# Patient Record
Sex: Female | Born: 1950
Health system: Southern US, Community
[De-identification: ages and names within clinical notes are randomized; demographics above are authoritative.]

## PROBLEM LIST (undated history)

## (undated) DIAGNOSIS — I10 Essential (primary) hypertension: Secondary | ICD-10-CM

## (undated) DIAGNOSIS — K635 Polyp of colon: Secondary | ICD-10-CM

## (undated) DIAGNOSIS — H409 Unspecified glaucoma: Secondary | ICD-10-CM

## (undated) DIAGNOSIS — E785 Hyperlipidemia, unspecified: Secondary | ICD-10-CM

## (undated) DIAGNOSIS — M1990 Unspecified osteoarthritis, unspecified site: Secondary | ICD-10-CM

## (undated) DIAGNOSIS — K921 Melena: Secondary | ICD-10-CM

## (undated) HISTORY — DX: Melena: K92.1

## (undated) HISTORY — DX: Hyperlipidemia, unspecified: E78.5

## (undated) HISTORY — DX: Polyp of colon: K63.5

## (undated) HISTORY — PX: WISDOM TOOTH EXTRACTION: SHX21

## (undated) HISTORY — DX: Unspecified osteoarthritis, unspecified site: M19.90

## (undated) HISTORY — DX: Essential (primary) hypertension: I10

---
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
## 1999-02-08 HISTORY — PX: HEMORRHOID SURGERY: SHX153

## 2000-11-27 ENCOUNTER — Other Ambulatory Visit: Admission: RE | Admit: 2000-11-27 | Discharge: 2000-11-27 | Payer: Self-pay | Admitting: Family Medicine

## 2003-04-04 ENCOUNTER — Other Ambulatory Visit: Admission: RE | Admit: 2003-04-04 | Discharge: 2003-04-04 | Payer: Self-pay | Admitting: Family Medicine

## 2004-02-08 DIAGNOSIS — J189 Pneumonia, unspecified organism: Secondary | ICD-10-CM

## 2004-02-08 HISTORY — DX: Pneumonia, unspecified organism: J18.9

## 2004-04-21 ENCOUNTER — Other Ambulatory Visit: Admission: RE | Admit: 2004-04-21 | Discharge: 2004-04-21 | Payer: Self-pay | Admitting: Family Medicine

## 2005-07-12 ENCOUNTER — Other Ambulatory Visit: Admission: RE | Admit: 2005-07-12 | Discharge: 2005-07-12 | Payer: Self-pay | Admitting: Family Medicine

## 2005-07-12 LAB — HM PAP SMEAR: HM Pap smear: NEGATIVE

## 2007-11-10 LAB — CBC AND DIFFERENTIAL
HCT: 41 (ref 36–46)
Hemoglobin: 14.2 (ref 12.0–16.0)
Neutrophils Absolute: 3
WBC: 5.4

## 2007-11-10 LAB — LIPID PANEL
Cholesterol: 219 — AB (ref 0–200)
HDL: 56 (ref 35–70)
LDL Cholesterol: 143
Triglycerides: 102 (ref 40–160)

## 2007-11-10 LAB — BASIC METABOLIC PANEL
BUN: 10 (ref 4–21)
Creatinine: 0.9 (ref 0.5–1.1)
Potassium: 4.4 (ref 3.4–5.3)
Sodium: 140 (ref 137–147)

## 2007-11-10 LAB — HEPATIC FUNCTION PANEL
ALT: 19 (ref 7–35)
AST: 17 (ref 13–35)
Alkaline Phosphatase: 81 (ref 25–125)

## 2017-04-04 ENCOUNTER — Ambulatory Visit: Payer: Self-pay | Admitting: Family Medicine

## 2017-04-06 ENCOUNTER — Encounter: Payer: Self-pay | Admitting: *Deleted

## 2017-04-10 ENCOUNTER — Encounter: Payer: Self-pay | Admitting: Family Medicine

## 2017-04-10 ENCOUNTER — Ambulatory Visit: Payer: BLUE CROSS/BLUE SHIELD | Admitting: Family Medicine

## 2017-04-10 VITALS — BP 141/88 | HR 82 | Temp 98.6°F | Ht 63.0 in | Wt 153.0 lb

## 2017-04-10 DIAGNOSIS — Z7689 Persons encountering health services in other specified circumstances: Secondary | ICD-10-CM | POA: Diagnosis not present

## 2017-04-10 DIAGNOSIS — Z0000 Encounter for general adult medical examination without abnormal findings: Secondary | ICD-10-CM

## 2017-04-10 DIAGNOSIS — Z1159 Encounter for screening for other viral diseases: Secondary | ICD-10-CM

## 2017-04-10 DIAGNOSIS — R030 Elevated blood-pressure reading, without diagnosis of hypertension: Secondary | ICD-10-CM | POA: Diagnosis not present

## 2017-04-10 DIAGNOSIS — Z114 Encounter for screening for human immunodeficiency virus [HIV]: Secondary | ICD-10-CM

## 2017-04-10 DIAGNOSIS — Z1231 Encounter for screening mammogram for malignant neoplasm of breast: Secondary | ICD-10-CM

## 2017-04-10 DIAGNOSIS — E663 Overweight: Secondary | ICD-10-CM

## 2017-04-10 DIAGNOSIS — Z130 Encounter for screening for diseases of the blood and blood-forming organs and certain disorders involving the immune mechanism: Secondary | ICD-10-CM

## 2017-04-10 DIAGNOSIS — E2839 Other primary ovarian failure: Secondary | ICD-10-CM | POA: Diagnosis not present

## 2017-04-10 DIAGNOSIS — Z13220 Encounter for screening for lipoid disorders: Secondary | ICD-10-CM

## 2017-04-10 DIAGNOSIS — Z1239 Encounter for other screening for malignant neoplasm of breast: Secondary | ICD-10-CM

## 2017-04-10 DIAGNOSIS — Z23 Encounter for immunization: Secondary | ICD-10-CM

## 2017-04-10 DIAGNOSIS — Z131 Encounter for screening for diabetes mellitus: Secondary | ICD-10-CM | POA: Diagnosis not present

## 2017-04-10 NOTE — Progress Notes (Signed)
Patient ID: Angela [name], female  DOB: May 08, 1950, 67 y.o.   MRN: [id number] Patient Care Team    Relationship Specialty Notifications Start End  Ma Hillock, DO PCP - General Family Medicine  04/10/17     Chief Complaint  Patient presents with  . Establish Care    CPE    Subjective:  Angela [name] is a 67 y.o.  Female  present for CPE. All past medical history, surgical history, allergies, family history, immunizations, medications and social history were obtained and entered in the electronic medical record today. All recent labs, ED visits and hospitalizations within the last year were reviewed.  Health maintenance:  Colonoscopy: completed 2-3 years ago by [facility], colon polyps present. Records requested.  Mammogram: many years ago. No fhx. --> ordered today Cervical cancer screening: many years ago. No abnormal PAP. Pt will schedule PAP in 4 months.  Immunizations: tdap completed today, Influenza UTD 2018 (encouraged yearly), PNA series start at 22. Shingrix #1 today, rpt dose in 2-6 months.  Infectious disease screening: HIV and  Hep C ordered.  DEXA: ordered today Assistive device: none Oxygen use: none Patient has a Dental home. Hospitalizations/ED visits: none   Depression screen [facility] 2/9 04/10/2017  Decreased Interest 0  Down, Depressed, Hopeless 0  PHQ - 2 Score 0   No flowsheet data found.   Current Exercise Habits: Home exercise routine, Type of exercise: Other - see comments, Time (Minutes): 60, Frequency (Times/Week): 5, Weekly Exercise (Minutes/Week): 300, Intensity: Moderate     Immunization History  Administered Date(s) Administered  . Influenza, High Dose Seasonal PF 10/14/2016  . Influenza-Unspecified 01/08/2001, 11/13/2001, 11/20/2012  . Td 02/07/1998  . Tdap 04/10/2017  . Zoster Recombinat (Shingrix) 04/10/2017     Past Medical History:  Diagnosis Date  . Arthritis   . Blood in stool   . Colon polyps   . Hyperlipidemia     Allergies  Allergen Reactions  . Pollen Extract    Past Surgical History:  Procedure Laterality Date  . EYE SURGERY  1956  . [place]  2001  . TONSILLECTOMY  1963  . WISDOM TOOTH EXTRACTION     Family History  Problem Relation Age of Onset  . Heart attack Father   . Heart attack Sister   . Hyperlipidemia Sister   . Hypertension Sister    Social History   Socioeconomic History  . Marital status: Married    Spouse name: Not on file  . Number of children: Not on file  . Years of education: Not on file  . Highest education level: Not on file  Social Needs  . Financial resource strain: Not on file  . Food insecurity - worry: Not on file  . Food insecurity - inability: Not on file  . Transportation needs - medical: Not on file  . Transportation needs - non-medical: Not on file  Occupational History  . Not on file  Tobacco Use  . Smoking status: Heavy Tobacco Smoker    Packs/day: 1.00    Years: 15.00    Pack years: 15.00    Types: Cigars  . Smokeless tobacco: Never Used  Substance and Sexual Activity  . Alcohol use: Yes    Alcohol/week: 2.4 oz    Types: 4 Glasses of wine per week  . Drug use: No  . Sexual activity: Yes    Partners: Male    Comment: Married  Other Topics Concern  . Not on file  Social History Narrative   Married. Retired. 2 children.   Bachelors degree.   Exercises routinely.   Drinks caffeine.   Smoke alarm in the home. Wears her seatbelt. Owns firearms.   Feels safe in her relationships.   Allergies as of 04/10/2017      Reactions   Pollen Extract       Medication List        Accurate as of 04/10/17  5:09 PM. Always use your most recent med list.          cholecalciferol 1000 units tablet Commonly known as:  VITAMIN D Take 1,000 Units by mouth daily.   FIBER-CAPS PO Take by mouth.   multivitamin tablet Take 1 tablet by mouth daily.       All past medical history, surgical history, allergies, family history,  immunizations andmedications were updated in the EMR today and reviewed under the history and medication portions of their EMR.     No results found for this or any previous visit (from the past 2160 hour(s)).  No results found.   ROS: 14 pt review of systems performed and negative (unless mentioned in an HPI)  Objective: BP (!) 141/88 (BP Location: Right Arm, Patient Position: Sitting, Cuff Size: Normal)   Pulse 82   Temp 98.6 F (37 C) (Oral)   Ht '5\' 3"'  (1.6 m)   Wt 153 lb (69.4 kg)   SpO2 95%   BMI 27.10 kg/m  Gen: Afebrile. No acute distress. Nontoxic in appearance, well-developed, well-nourished,  pleasant caucasian female. overweight HENT: AT. [place]. Bilateral TM visualized and normal in appearance, normal external auditory canal. MMM, no oral lesions, adequate dentition. Bilateral nares within normal limits. Throat without erythema, ulcerations or exudates. no Cough on exam, no hoarseness on exam. Eyes:Pupils Equal Round Reactive to light, Extraocular movements intact,  Conjunctiva without redness, discharge or icterus. Neck/lymp/endocrine: Supple,no lymphadenopathy, no thyromegaly CV: RRR no murmur, no edema, +2/4 P posterior tibialis pulses. no carotid bruits. No JVD. Chest: CTAB, no wheeze, rhonchi or crackles. normal Respiratory effort. good Air movement. Abd: Soft. overwieght. NTND. BS present. no Masses palpated. No hepatosplenomegaly. No rebound tenderness or guarding. Skin: no rashes, purpura or petechiae. Warm and well-perfused. Skin intact. Neuro/Msk:  Normal gait. PERLA. EOMi. Alert. Oriented x3.  Cranial nerves II through XII intact. Muscle strength 5/5 upper/lower extremity. DTRs equal bilaterally. Psych: Normal affect, dress and demeanor. Normal speech. Normal thought content and judgment.   No exam data present  Assessment/plan: Angela [name] is a 67 y.o. female present for CPE.  Encounter for preventive health examination/EST care Patient was encouraged to  exercise greater than 150 minutes a week. Patient was encouraged to choose a diet filled with fresh fruits and vegetables, and lean meats. AVS provided to patient today for education/recommendation on gender specific health and safety maintenance. Colonoscopy: completed 2-3 years ago by [facility], colon polyps present. Records requested.  Mammogram: many years ago. No fhx. --> ordered today Cervical cancer screening: many years ago. No abnormal PAP. Pt will schedule PAP in 4 months.  Immunizations: tdap completed today, Influenza UTD 2018 (encouraged yearly), PNA series start at 12. Shingrix #1 today, rpt dose in 2-6 months.  Infectious disease screening: HIV and  Hep C ordered.  DEXA: ordered today Encounter for hepatitis C screening test for low risk patient - Hepatitis C Antibody; Future Encounter for screening for HIV - HIV antibody (with reflex); Future Screening for deficiency anemia - CBC w/Diff; Future Diabetes mellitus screening -  HgB A1c; Future Screening cholesterol level - Lipid panel; Future Estrogen deficiency - DG Bone Density; Future Breast cancer screening - MM DIGITAL SCREENING BILATERAL; Future Elevated BP without diagnosis of hypertension/overweight Low sodium, exercise > 150 minutes a week. If elevated on repeat at PAP consider start of med.  - Comp Met (CMET); Future - TSH; Future - HgB A1c; Future - Lipid panel; Future Immunization due - Tdap vaccine greater than or equal to 7yo IM - Varicella-zoster vaccine IM--> #2 in 4 mos at PAP   Return in about 4 months (around 08/10/2017) for PAP, shingrix #2, BP recheck.  Electronically signed by: Howard Pouch, DO [place]

## 2017-04-10 NOTE — Patient Instructions (Signed)
I have ordered the mammogram and DEXA scan to be completed at [facility].  Schedule fasting labs this week.  You will receive tdap and shingrix #1 (repeat in 2-6 months) Schedule PAP in 4-5 months and we can give the second shingrix at that time.   Health Maintenance, Female Adopting a healthy lifestyle and getting preventive care can go a long way to promote health and wellness. Talk with your health care provider about what schedule of regular examinations is right for you. This is a good chance for you to check in with your provider about disease prevention and staying healthy. In between checkups, there are plenty of things you can do on your own. Experts have done a lot of research about which lifestyle changes and preventive measures are most likely to keep you healthy. Ask your health care provider for more information. Weight and diet Eat a healthy diet  Be sure to include plenty of vegetables, fruits, low-fat dairy products, and lean protein.  Do not eat a lot of foods high in solid fats, added sugars, or salt.  Get regular exercise. This is one of the most important things you can do for your health. ? Most adults should exercise for at least 150 minutes each week. The exercise should increase your heart rate and make you sweat (moderate-intensity exercise). ? Most adults should also do strengthening exercises at least twice a week. This is in addition to the moderate-intensity exercise.  Maintain a healthy weight  Body mass index (BMI) is a measurement that can be used to identify possible weight problems. It estimates body fat based on height and weight. Your health care provider can help determine your BMI and help you achieve or maintain a healthy weight.  For females 56 years of age and older: ? A BMI below 18.5 is considered underweight. ? A BMI of 18.5 to 24.9 is normal. ? A BMI of 25 to 29.9 is considered overweight. ? A BMI of 30 and above is considered  obese.  Watch levels of cholesterol and blood lipids  You should start having your blood tested for lipids and cholesterol at 65 years of age, then have this test every 5 years.  You may need to have your cholesterol levels checked more often if: ? Your lipid or cholesterol levels are high. ? You are older than 65 years of age. ? You are at high risk for heart disease.  Cancer screening Lung Cancer  Lung cancer screening is recommended for adults 50-28 years old who are at high risk for lung cancer because of a history of smoking.  A yearly low-dose CT scan of the lungs is recommended for people who: ? Currently smoke. ? Have quit within the past 15 years. ? Have at least a 30-pack-year history of smoking. A pack year is smoking an average of one pack of cigarettes a day for 1 year.  Yearly screening should continue until it has been 15 years since you quit.  Yearly screening should stop if you develop a health problem that would prevent you from having lung cancer treatment.  Breast Cancer  Practice breast self-awareness. This means understanding how your breasts normally appear and feel.  It also means doing regular breast self-exams. Let your health care provider know about any changes, no matter how small.  If you are in your 20s or 30s, you should have a clinical breast exam (CBE) by a health care provider every 1-3 years as part of a regular  health exam.  If you are 40 or older, have a CBE every year. Also consider having a breast X-ray (mammogram) every year.  If you have a family history of breast cancer, talk to your health care provider about genetic screening.  If you are at high risk for breast cancer, talk to your health care provider about having an MRI and a mammogram every year.  Breast cancer gene (BRCA) assessment is recommended for women who have family members with BRCA-related cancers. BRCA-related cancers  include: ? Breast. ? Ovarian. ? Tubal. ? Peritoneal cancers.  Results of the assessment will determine the need for genetic counseling and BRCA1 and BRCA2 testing.  Cervical Cancer Your health care provider may recommend that you be screened regularly for cancer of the pelvic organs (ovaries, uterus, and vagina). This screening involves a pelvic examination, including checking for microscopic changes to the surface of your cervix (Pap test). You may be encouraged to have this screening done every 3 years, beginning at age 21.  For women ages 30-65, health care providers may recommend pelvic exams and Pap testing every 3 years, or they may recommend the Pap and pelvic exam, combined with testing for human papilloma virus (HPV), every 5 years. Some types of HPV increase your risk of cervical cancer. Testing for HPV may also be done on women of any age with unclear Pap test results.  Other health care providers may not recommend any screening for nonpregnant women who are considered low risk for pelvic cancer and who do not have symptoms. Ask your health care provider if a screening pelvic exam is right for you.  If you have had past treatment for cervical cancer or a condition that could lead to cancer, you need Pap tests and screening for cancer for at least 20 years after your treatment. If Pap tests have been discontinued, your risk factors (such as having a new sexual partner) need to be reassessed to determine if screening should resume. Some women have medical problems that increase the chance of getting cervical cancer. In these cases, your health care provider may recommend more frequent screening and Pap tests.  Colorectal Cancer  This type of cancer can be detected and often prevented.  Routine colorectal cancer screening usually begins at 65 years of age and continues through 65 years of age.  Your health care provider may recommend screening at an earlier age if you have risk factors  for colon cancer.  Your health care provider may also recommend using home test kits to check for hidden blood in the stool.  A small camera at the end of a tube can be used to examine your colon directly (sigmoidoscopy or colonoscopy). This is done to check for the earliest forms of colorectal cancer.  Routine screening usually begins at age 50.  Direct examination of the colon should be repeated every 5-10 years through 65 years of age. However, you may need to be screened more often if early forms of precancerous polyps or small growths are found.  Skin Cancer  Check your skin from head to toe regularly.  Tell your health care provider about any new moles or changes in moles, especially if there is a change in a mole's shape or color.  Also tell your health care provider if you have a mole that is larger than the size of a pencil eraser.  Always use sunscreen. Apply sunscreen liberally and repeatedly throughout the day.  Protect yourself by wearing long sleeves, pants, a   wide-brimmed hat, and sunglasses whenever you are outside.  Heart disease, diabetes, and high blood pressure  High blood pressure causes heart disease and increases the risk of stroke. High blood pressure is more likely to develop in: ? People who have blood pressure in the high end of the normal range (130-139/85-89 mm Hg). ? People who are overweight or obese. ? People who are African American.  If you are 54-30 years of age, have your blood pressure checked every 3-5 years. If you are 3 years of age or older, have your blood pressure checked every year. You should have your blood pressure measured twice-once when you are at a hospital or clinic, and once when you are not at a hospital or clinic. Record the average of the two measurements. To check your blood pressure when you are not at a hospital or clinic, you can use: ? An automated blood pressure machine at a pharmacy. ? A home blood pressure monitor.  If  you are between 55 years and 21 years old, ask your health care provider if you should take aspirin to prevent strokes.  Have regular diabetes screenings. This involves taking a blood sample to check your fasting blood sugar level. ? If you are at a normal weight and have a low risk for diabetes, have this test once every three years after 65 years of age. ? If you are overweight and have a high risk for diabetes, consider being tested at a younger age or more often. Preventing infection Hepatitis B  If you have a higher risk for hepatitis B, you should be screened for this virus. You are considered at high risk for hepatitis B if: ? You were born in a country where hepatitis B is common. Ask your health care provider which countries are considered high risk. ? Your parents were born in a high-risk country, and you have not been immunized against hepatitis B (hepatitis B vaccine). ? You have HIV or AIDS. ? You use needles to inject street drugs. ? You live with someone who has hepatitis B. ? You have had sex with someone who has hepatitis B. ? You get hemodialysis treatment. ? You take certain medicines for conditions, including cancer, organ transplantation, and autoimmune conditions.  Hepatitis C  Blood testing is recommended for: ? Everyone born from [birth year] through 1963. ? Anyone with known risk factors for hepatitis C.  Sexually transmitted infections (STIs)  You should be screened for sexually transmitted infections (STIs) including gonorrhea and chlamydia if: ? You are sexually active and are younger than 65 years of age. ? You are older than 65 years of age and your health care provider tells you that you are at risk for this type of infection. ? Your sexual activity has changed since you were last screened and you are at an increased risk for chlamydia or gonorrhea. Ask your health care provider if you are at risk.  If you do not have HIV, but are at risk, it may be recommended  that you take a prescription medicine daily to prevent HIV infection. This is called pre-exposure prophylaxis (PrEP). You are considered at risk if: ? You are sexually active and do not regularly use condoms or know the HIV status of your partner(s). ? You take drugs by injection. ? You are sexually active with a partner who has HIV.  Talk with your health care provider about whether you are at high risk of being infected with HIV. If you choose to  begin PrEP, you should first be tested for HIV. You should then be tested every 3 months for as long as you are taking PrEP. Pregnancy  If you are premenopausal and you may become pregnant, ask your health care provider about preconception counseling.  If you may become pregnant, take 400 to 800 micrograms (mcg) of folic acid every day.  If you want to prevent pregnancy, talk to your health care provider about birth control (contraception). Osteoporosis and menopause  Osteoporosis is a disease in which the bones lose minerals and strength with aging. This can result in serious bone fractures. Your risk for osteoporosis can be identified using a bone density scan.  If you are 65 years of age or older, or if you are at risk for osteoporosis and fractures, ask your health care provider if you should be screened.  Ask your health care provider whether you should take a calcium or vitamin D supplement to lower your risk for osteoporosis.  Menopause may have certain physical symptoms and risks.  Hormone replacement therapy may reduce some of these symptoms and risks. Talk to your health care provider about whether hormone replacement therapy is right for you. Follow these instructions at home:  Schedule regular health, dental, and eye exams.  Stay current with your immunizations.  Do not use any tobacco products including cigarettes, chewing tobacco, or electronic cigarettes.  If you are pregnant, do not drink alcohol.  If you are  breastfeeding, limit how much and how often you drink alcohol.  Limit alcohol intake to no more than 1 drink per day for nonpregnant women. One drink equals 12 ounces of beer, 5 ounces of wine, or 1 ounces of hard liquor.  Do not use street drugs.  Do not share needles.  Ask your health care provider for help if you need support or information about quitting drugs.  Tell your health care provider if you often feel depressed.  Tell your health care provider if you have ever been abused or do not feel safe at home. This information is not intended to replace advice given to you by your health care provider. Make sure you discuss any questions you have with your health care provider. Document Released: 08/09/2010 Document Revised: 07/02/2015 Document Reviewed: 10/28/2014 Elsevier Interactive Patient Education  2018 [employer]  

## 2017-04-12 ENCOUNTER — Other Ambulatory Visit: Payer: BLUE CROSS/BLUE SHIELD

## 2017-04-19 ENCOUNTER — Telehealth: Payer: Self-pay | Admitting: Family Medicine

## 2017-04-19 NOTE — Telephone Encounter (Signed)
Copied from CRM [phone]. Topic: Quick Communication - See Telephone Encounter >> Apr 19, 2017  8:37 AM Diana EvesHoyt, Maryann B wrote: CRM for notification. See Telephone encounter for:  [facility] [place] has tried to call the pt 3 times to schedule mammo and dexa. All the times she has been disconnected when it rings.  04/19/17.

## 2017-04-19 NOTE — Telephone Encounter (Signed)
I called the patient. She has been out of town. [facility] has the [facility] phone #. She will call to schedule her appointment.

## 2017-04-21 ENCOUNTER — Other Ambulatory Visit (INDEPENDENT_AMBULATORY_CARE_PROVIDER_SITE_OTHER): Payer: BLUE CROSS/BLUE SHIELD

## 2017-04-21 DIAGNOSIS — Z131 Encounter for screening for diabetes mellitus: Secondary | ICD-10-CM | POA: Diagnosis not present

## 2017-04-21 DIAGNOSIS — Z114 Encounter for screening for human immunodeficiency virus [HIV]: Secondary | ICD-10-CM

## 2017-04-21 DIAGNOSIS — E663 Overweight: Secondary | ICD-10-CM | POA: Diagnosis not present

## 2017-04-21 DIAGNOSIS — Z1159 Encounter for screening for other viral diseases: Secondary | ICD-10-CM

## 2017-04-21 DIAGNOSIS — R030 Elevated blood-pressure reading, without diagnosis of hypertension: Secondary | ICD-10-CM

## 2017-04-21 DIAGNOSIS — Z130 Encounter for screening for diseases of the blood and blood-forming organs and certain disorders involving the immune mechanism: Secondary | ICD-10-CM

## 2017-04-21 DIAGNOSIS — Z13220 Encounter for screening for lipoid disorders: Secondary | ICD-10-CM

## 2017-04-21 LAB — COMPREHENSIVE METABOLIC PANEL
ALBUMIN: 4.3 g/dL (ref 3.5–5.2)
ALK PHOS: 71 U/L (ref 39–117)
ALT: 20 U/L (ref 0–35)
AST: 14 U/L (ref 0–37)
BILIRUBIN TOTAL: 0.4 mg/dL (ref 0.2–1.2)
BUN: 7 mg/dL (ref 6–23)
CALCIUM: 9.1 mg/dL (ref 8.4–10.5)
CO2: 30 mEq/L (ref 19–32)
Chloride: 104 mEq/L (ref 96–112)
Creatinine, Ser: 0.65 mg/dL (ref 0.40–1.20)
GFR: 97.41 mL/min (ref >60.00)
Glucose, Bld: 104 mg/dL — ABNORMAL HIGH (ref 70–99)
Potassium: 4.4 mEq/L (ref 3.5–5.1)
Sodium: 139 mEq/L (ref 135–145)
Total Protein: 6.5 g/dL (ref 6.0–8.3)

## 2017-04-21 LAB — LIPID PANEL
Cholesterol: 227 mg/dL — ABNORMAL HIGH (ref 0–200)
HDL: 70.2 mg/dL (ref >39.00)
LDL Cholesterol: 140 mg/dL — ABNORMAL HIGH (ref 0–99)
NonHDL: 156.72
Total CHOL/HDL Ratio: 3
Triglycerides: 86 mg/dL (ref 0.0–149.0)
VLDL: 17.2 mg/dL (ref 0.0–40.0)

## 2017-04-21 LAB — CBC WITH DIFFERENTIAL/PLATELET
Basophils Absolute: 0.1 10*3/uL (ref 0.0–0.1)
Basophils Relative: 0.8 % (ref 0.0–3.0)
Eosinophils Absolute: 0.2 10*3/uL (ref 0.0–0.7)
Eosinophils Relative: 2.3 % (ref 0.0–5.0)
HEMATOCRIT: 42.9 % (ref 36.0–46.0)
Hemoglobin: 14.6 g/dL (ref 12.0–15.0)
LYMPHS ABS: 2.4 10*3/uL (ref 0.7–4.0)
Lymphocytes Relative: 29.1 % (ref 12.0–46.0)
MCHC: 34.1 g/dL (ref 30.0–36.0)
MCV: 90.8 fl (ref 78.0–100.0)
MONOS PCT: 5.5 % (ref 3.0–12.0)
Monocytes Absolute: 0.5 10*3/uL (ref 0.1–1.0)
NEUTROS PCT: 62.3 % (ref 43.0–77.0)
Neutro Abs: 5.1 10*3/uL (ref 1.4–7.7)
Platelets: 377 10*3/uL (ref 150.0–400.0)
RBC: 4.73 Mil/uL (ref 3.87–5.11)
RDW: 14.2 % (ref 11.5–15.5)
WBC: 8.2 10*3/uL (ref 4.0–10.5)

## 2017-04-21 LAB — HEMOGLOBIN A1C: Hgb A1c MFr Bld: 5.7 % (ref 4.6–6.5)

## 2017-04-21 LAB — TSH: TSH: 0.93 u[IU]/mL (ref 0.35–4.50)

## 2017-04-23 LAB — HIV ANTIBODY (ROUTINE TESTING W REFLEX): HIV: NONREACTIVE

## 2017-04-23 LAB — HEPATITIS C ANTIBODY
Hepatitis C Ab: NONREACTIVE
SIGNAL TO CUT-OFF: 0.02 (ref <1.00)

## 2017-04-24 ENCOUNTER — Telehealth: Payer: Self-pay | Admitting: Family Medicine

## 2017-04-24 NOTE — Telephone Encounter (Signed)
Copied from CRM [phone]. Topic: Quick Communication - Lab Results >> Apr 24, 2017  4:08 PM Lelon FrohlichGolden, Ekaterini Capitano, RMA wrote: Pt returning call for lab results

## 2017-04-25 NOTE — Telephone Encounter (Signed)
See result note.  

## 2017-05-11 ENCOUNTER — Ambulatory Visit
Admission: RE | Admit: 2017-05-11 | Discharge: 2017-05-11 | Disposition: A | Discharge status: Home / Self Care | Payer: BLUE CROSS/BLUE SHIELD | Source: Ambulatory Visit | Attending: Family Medicine | Admitting: Family Medicine

## 2017-05-11 DIAGNOSIS — Z1239 Encounter for other screening for malignant neoplasm of breast: Secondary | ICD-10-CM

## 2017-05-11 DIAGNOSIS — E2839 Other primary ovarian failure: Secondary | ICD-10-CM

## 2017-05-15 ENCOUNTER — Encounter: Payer: Self-pay | Admitting: Family Medicine

## 2017-05-15 ENCOUNTER — Other Ambulatory Visit: Payer: Self-pay | Admitting: Family Medicine

## 2017-05-15 DIAGNOSIS — R928 Other abnormal and inconclusive findings on diagnostic imaging of breast: Secondary | ICD-10-CM | POA: Insufficient documentation

## 2017-05-19 ENCOUNTER — Other Ambulatory Visit: Payer: BLUE CROSS/BLUE SHIELD

## 2017-05-22 ENCOUNTER — Ambulatory Visit
Admission: RE | Admit: 2017-05-22 | Discharge: 2017-05-22 | Disposition: A | Discharge status: Home / Self Care | Payer: BLUE CROSS/BLUE SHIELD | Source: Ambulatory Visit | Attending: Family Medicine | Admitting: Family Medicine

## 2017-05-22 ENCOUNTER — Other Ambulatory Visit: Payer: Self-pay | Admitting: Family Medicine

## 2017-05-22 DIAGNOSIS — N6310 Unspecified lump in the right breast, unspecified quadrant: Secondary | ICD-10-CM

## 2017-05-22 DIAGNOSIS — R928 Other abnormal and inconclusive findings on diagnostic imaging of breast: Secondary | ICD-10-CM

## 2017-09-07 ENCOUNTER — Ambulatory Visit: Payer: BLUE CROSS/BLUE SHIELD | Admitting: Family Medicine

## 2017-09-07 ENCOUNTER — Ambulatory Visit: Payer: BLUE CROSS/BLUE SHIELD

## 2017-09-12 ENCOUNTER — Ambulatory Visit: Payer: PRIVATE HEALTH INSURANCE

## 2017-09-12 ENCOUNTER — Ambulatory Visit (INDEPENDENT_AMBULATORY_CARE_PROVIDER_SITE_OTHER): Payer: PRIVATE HEALTH INSURANCE | Admitting: Family Medicine

## 2017-09-12 ENCOUNTER — Encounter: Payer: Self-pay | Admitting: Family Medicine

## 2017-09-12 ENCOUNTER — Other Ambulatory Visit (HOSPITAL_COMMUNITY)
Admission: RE | Admit: 2017-09-12 | Discharge: 2017-09-12 | Disposition: A | Discharge status: Home / Self Care | Payer: PRIVATE HEALTH INSURANCE | Source: Ambulatory Visit | Attending: Family Medicine | Admitting: Family Medicine

## 2017-09-12 VITALS — BP 128/81 | HR 83 | Temp 99.0°F | Resp 20 | Ht 63.0 in | Wt 145.6 lb

## 2017-09-12 DIAGNOSIS — Z23 Encounter for immunization: Secondary | ICD-10-CM

## 2017-09-12 DIAGNOSIS — Z1151 Encounter for screening for human papillomavirus (HPV): Secondary | ICD-10-CM | POA: Diagnosis not present

## 2017-09-12 DIAGNOSIS — F17210 Nicotine dependence, cigarettes, uncomplicated: Secondary | ICD-10-CM | POA: Insufficient documentation

## 2017-09-12 DIAGNOSIS — Z01419 Encounter for gynecological examination (general) (routine) without abnormal findings: Secondary | ICD-10-CM | POA: Diagnosis present

## 2017-09-12 DIAGNOSIS — Z8249 Family history of ischemic heart disease and other diseases of the circulatory system: Secondary | ICD-10-CM | POA: Diagnosis not present

## 2017-09-12 DIAGNOSIS — R928 Other abnormal and inconclusive findings on diagnostic imaging of breast: Secondary | ICD-10-CM | POA: Diagnosis not present

## 2017-09-12 DIAGNOSIS — Z124 Encounter for screening for malignant neoplasm of cervix: Secondary | ICD-10-CM | POA: Diagnosis not present

## 2017-09-12 NOTE — Patient Instructions (Signed)
It was a pleasure to see you today.  We will call you once we get the results back.   Please help us help you:  We are honored [facility] have [facility]. Below you will find basic instructions that you may need to access in the future. Please help us help you by reading the instructions, which cover many of the frequent questions we experience.   Prescription refills and request:  -In order to allow more efficient response time, please call your pharmacy for all refills. They will forward the request electronically to us. This allows for the quickest possible response. Request left on a nurse line can take longer to refill, since these are checked as time allows between office patients and other phone calls.  - refill request can take up to 3-5 working days to complete.  - If request is sent electronically and request is appropiate, it is usually completed in 1-2 business days.  - all patients will need to be seen routinely for all chronic medical conditions requiring prescription medications (see follow-up below). If you are overdue for follow up on your condition, you will be asked to make an appointment and we will call in enough medication to cover you until your appointment (up to 30 days).  - all controlled substances will require a face to face visit to request/refill.  - if you desire your prescriptions to go through a new pharmacy, and have an active script at original pharmacy, you will need to call your pharmacy and have scripts transferred to new pharmacy. This is completed between the pharmacy locations and not by your provider.    Results: If any images or labs were ordered, it can take up to 1 week to get results depending on the test ordered and the lab/facility running and resulting the test. - Normal or stable results, which do not need further discussion, may be released to your mychart immediately with attached note to you. A call may not be  generated for normal results. Please make certain to sign up for mychart. If you have questions on how to activate your mychart you can call the front office.  - If your results need further discussion, our office will attempt to contact you via phone, and if unable to reach you after 2 attempts, we will release your abnormal result to your mychart with instructions.  - All results will be automatically released in mychart after 1 week.  - Your provider will provide you with explanation and instruction on all relevant material in your results. Please keep in mind, results and labs may appear confusing or abnormal to the untrained eye, but it does not mean they are actually abnormal for you personally. If you have any questions about your results that are not covered, or you desire more detailed explanation than what was provided, you should make an appointment with your provider to do so.   Our office handles many outgoing and incoming calls daily. If we have not contacted you within 1 week about your results, please check your mychart to see if there is a message first and if not, then contact our office.  In helping with this matter, you help decrease call volume, and therefore allow us to be able to respond to patients needs more efficiently.   Acute office visits (sick visit):  An acute visit is intended for a new problem and are scheduled in shorter time slots to allow schedule openings  for patients with new problems. This is the appropriate visit to discuss a new problem. Problems will not be addressed by phone call or Echart message. Appointment is needed if requesting treatment. In order to provide you with excellent quality medical care with proper time for you to explain your problem, have an exam and receive treatment with instructions, these appointments should be limited to one new problem per visit. If you experience a new problem, in which you desire to be addressed, please make an acute  office visit, we save openings on the schedule to accommodate you. Please do not save your new problem for any other type of visit, let us take care of it properly and quickly for you.   Follow up visits:  Depending on your condition(s) your provider will need to see you routinely in order to provide you with quality care and prescribe medication(s). Most chronic conditions (Example: hypertension, Diabetes, depression/anxiety... etc), require visits a couple times a year. Your provider will instruct you on proper follow up for your personal medical conditions and history. Please make certain to make follow up appointments for your condition as instructed. Failing to do so could result in lapse in your medication treatment/refills. If you request a refill, and are overdue to be seen on a condition, we will always provide you with a 30 Friedl script (once) to allow you time to schedule.    Medicare wellness (well visit): - we have a wonderful Nurse Maudie Mercury), that will meet with you and provide you will yearly medicare wellness visits. These visits should occur yearly (can not be scheduled less than 1 calendar year apart) and cover preventive health, immunizations, advance directives and screenings you are entitled to yearly through your medicare benefits. Do not miss out on your entitled benefits, this is when medicare will pay for these benefits to be ordered for you.  These are strongly encouraged by your provider and is the appropriate type of visit to make certain you are up to date with all preventive health benefits. If you have not had your medicare wellness exam in the last 12 months, please make certain to schedule one by calling the office and schedule your medicare wellness with Maudie Mercury as soon as possible.   Yearly physical (well visit):  - Adults are recommended to be seen yearly for physicals. Check with your insurance and date of your last physical, most insurances require one calendar year between  physicals. Physicals include all preventive health topics, screenings, medical exam and labs that are appropriate for gender/age and history. You may have fasting labs needed at this visit. This is a well visit (not a sick visit), new problems should not be covered during this visit (see acute visit).  - Pediatric patients are seen more frequently when they are younger. Your provider will advise you on well child visit timing that is appropriate for your their age. - This is not a medicare wellness visit. Medicare wellness exams do not have an exam portion to the visit. Some medicare companies allow for a physical, some do not allow a yearly physical. If your medicare allows a yearly physical you can schedule the medicare wellness with our nurse Maudie Mercury and have your physical with your provider after, on the same Melfi. Please check with insurance for your full benefits.   Late Policy/No Shows:  - all new patients should arrive 15-30 minutes earlier than appointment to allow [place] time  to  obtain all personal demographics,  insurance information and  for you to complete office paperwork. - All established patients should arrive 10-15 minutes earlier than appointment time to update all information and be checked in .  - In our best efforts to run on time, if you are late for your appointment you will be asked to either reschedule or if able, we will work you back into the schedule. There will be a wait time to work you back in the schedule,  depending on availability.  - If you are unable to make it to your appointment as scheduled, please call 24 hours ahead of time to allow [place] to fill the time slot with someone else who needs to be seen. If you do not cancel your appointment ahead of time, you may be charged a no show fee.

## 2017-09-12 NOTE — Progress Notes (Signed)
Angela [name] , 08/11/1950, 67 y.o., female MRN: [id number] Patient Care Team    Relationship Specialty Notifications Start End  Natalia Leatherwood, DO PCP - General Family Medicine  04/10/17     Chief Complaint  Patient presents with  . Gynecologic Exam  . Immunizations    shingrix #2     Subjective: Pt presents for an OV for routine gynecological exam and PAP. She has not had a PAP or gyn exam since 2007. She reports one abnormal pap when she was in her early 35s, with many normal PAPs since. She is postmenopausal. She went through menopause around age 41. She is married and sexually active with her husband. She denies vaginal discharge, dysparunia, bleeding or dryness.   Depression screen [facility] 2/9 09/12/2017 04/10/2017  Decreased Interest 0 0  Down, Depressed, Hopeless 0 0  PHQ - 2 Score 0 0    Allergies  Allergen Reactions  . Pollen Extract    Social History   Tobacco Use  . Smoking status: Heavy Tobacco Smoker    Packs/day: 1.00    Years: 15.00    Pack years: 15.00    Types: Cigars  . Smokeless tobacco: Never Used  Substance Use Topics  . Alcohol use: Yes    Alcohol/week: 2.4 oz    Types: 4 Glasses of wine per week   Past Medical History:  Diagnosis Date  . Arthritis   . Blood in stool   . Colon polyps   . Hyperlipidemia    Past Surgical History:  Procedure Laterality Date  . EYE SURGERY  1956  . HEMORRHOID SURGERY  2001  . TONSILLECTOMY  1963  . WISDOM TOOTH EXTRACTION     Family History  Problem Relation Age of Onset  . Heart attack Father   . Heart attack Sister   . Hyperlipidemia Sister   . Hypertension Sister    Allergies as of 09/12/2017      Reactions   Pollen Extract       Medication List        Accurate as of 09/12/17 10:19 AM. Always use your most recent med list.          cholecalciferol 1000 units tablet Commonly known as:  VITAMIN D Take 1,000 Units by mouth daily.   FIBER-CAPS PO Take by mouth.   latanoprost 0.005 %  ophthalmic solution Commonly known as:  XALATAN Place 1 drop into both eyes at bedtime.   multivitamin tablet Take 1 tablet by mouth daily.   PREVIDENT 5000 BOOSTER PLUS 1.1 % Pste Generic drug:  Sodium Fluoride U UTD       All past medical history, surgical history, allergies, family history, immunizations andmedications were updated in the EMR today and reviewed under the history and medication portions of their EMR.     ROS: Negative, with the exception of above mentioned in HPI   Objective:  BP 128/81 (BP Location: Left Arm, Patient Position: Sitting, Cuff Size: Normal)   Pulse 83   Temp 99 F (37.2 C)   Resp 20   Ht 5\' 3"  (1.6 m)   Wt 145 lb 9.6 oz (66 kg)   SpO2 97%   BMI 25.79 kg/m  Body mass index is 25.79 kg/m. Gen: Afebrile. No acute distress. Nontoxic in appearance, well developed, well nourished.  HENT: AT. [place].  MMM Eyes:Pupils Equal Round Reactive to light, Extraocular movements intact,  Conjunctiva without redness, discharge or icterus. Neck/lymp/endocrine: Supple,no lymphadenopathy CV: RRR no murmur  Chest: CTAB, no wheeze or crackles. Good air movement, normal resp effort.  Abd: Soft. flat. NTND. BS present. no Masses palpated. No rebound or guarding.  Skin: no rashes, purpura or petechiae.  Neuro:  Normal gait. PERLA. EOMi. Alert. Oriented x3 Psych: Normal affect, dress and demeanor. Normal speech. Normal thought content and judgment. Breasts: breasts appear normal, symmetrical, no tenderness on exam, no suspicious masses, no skin or nipple changes or axillary nodes. GYN:  External genitalia within normal limits, normal hair distribution, no lesions. Urethral meatus normal, no lesions. Vaginal mucosa pink, moist, normal rugae, no lesions. No cystocele or rectocele. cervix without lesions, no discharge. Bimanual exam revealed normal uterus.  No bladder/suprapubic fullness, masses or tenderness. No cervical motion tenderness. No adnexal fullness. Anus and  perineum within normal limits, no lesions.   No exam data present No results found. No results found for this or any previous visit (from the past 24 hour(s)).  Assessment/Plan: Angela [name] is a 67 y.o. female present for OV for  Immunization due - Varicella-zoster vaccine IM #2 provided today  Encounter for routine gynecological examination with Papanicolaou smear of cervix Normal exam. If negative with neg co-test, she will not need further PAP test. - Cytology - PAP  Abnormal mammogram Reviewed results of mam and US of right breast. She has appt for repeat in Oct.    Reviewed expectations re: course of current medical issues.  Discussed self-management of symptoms.  Outlined signs and symptoms indicating need for more acute intervention.  Patient verbalized understanding and all questions were answered.  Patient received an After-Visit Summary.    No orders of the defined types were placed in this encounter.    Note is dictated utilizing voice recognition software. Although note has been proof read prior to signing, occasional typographical errors still can be missed. If any questions arise, please do not hesitate to call for verification.   electronically signed by:  Felix Pacinienee Kuneff, DO  [facility] [place]

## 2017-09-14 LAB — CYTOLOGY - PAP
Diagnosis: NEGATIVE
HPV (WINDOPATH): NOT DETECTED

## 2017-11-21 ENCOUNTER — Other Ambulatory Visit: Payer: BLUE CROSS/BLUE SHIELD

## 2017-11-28 ENCOUNTER — Ambulatory Visit
Admission: RE | Admit: 2017-11-28 | Discharge: 2017-11-28 | Disposition: A | Discharge status: Home / Self Care | Payer: Medicare HMO | Source: Ambulatory Visit | Attending: Family Medicine | Admitting: Family Medicine

## 2017-11-28 ENCOUNTER — Ambulatory Visit: Payer: Self-pay

## 2017-11-28 ENCOUNTER — Other Ambulatory Visit: Payer: Self-pay | Admitting: Family Medicine

## 2017-11-28 DIAGNOSIS — N6489 Other specified disorders of breast: Secondary | ICD-10-CM

## 2017-11-28 DIAGNOSIS — N6310 Unspecified lump in the right breast, unspecified quadrant: Secondary | ICD-10-CM

## 2017-12-27 ENCOUNTER — Encounter: Payer: Self-pay | Admitting: *Deleted

## 2018-05-21 ENCOUNTER — Other Ambulatory Visit: Payer: Self-pay | Admitting: Family Medicine

## 2018-05-21 DIAGNOSIS — N6489 Other specified disorders of breast: Secondary | ICD-10-CM

## 2018-07-16 ENCOUNTER — Ambulatory Visit
Admission: RE | Admit: 2018-07-16 | Discharge: 2018-07-16 | Disposition: A | Discharge status: Home / Self Care | Payer: Medicare HMO | Source: Ambulatory Visit | Attending: Family Medicine | Admitting: Family Medicine

## 2018-07-16 ENCOUNTER — Other Ambulatory Visit: Payer: Self-pay

## 2018-07-16 ENCOUNTER — Other Ambulatory Visit: Payer: Self-pay | Admitting: Family Medicine

## 2018-07-16 DIAGNOSIS — N6489 Other specified disorders of breast: Secondary | ICD-10-CM

## 2018-07-19 ENCOUNTER — Ambulatory Visit
Admission: RE | Admit: 2018-07-19 | Discharge: 2018-07-19 | Disposition: A | Discharge status: Home / Self Care | Payer: Medicare HMO | Source: Ambulatory Visit | Attending: Family Medicine | Admitting: Family Medicine

## 2018-07-19 ENCOUNTER — Other Ambulatory Visit: Payer: Self-pay

## 2018-07-19 DIAGNOSIS — N6489 Other specified disorders of breast: Secondary | ICD-10-CM

## 2018-07-19 HISTORY — PX: BREAST BIOPSY: SHX20

## 2018-07-20 ENCOUNTER — Encounter: Payer: Self-pay | Admitting: Family Medicine

## 2018-08-06 ENCOUNTER — Other Ambulatory Visit: Payer: Self-pay | Admitting: Family Medicine

## 2018-08-06 DIAGNOSIS — N6489 Other specified disorders of breast: Secondary | ICD-10-CM

## 2018-08-06 DIAGNOSIS — N6320 Unspecified lump in the left breast, unspecified quadrant: Secondary | ICD-10-CM

## 2018-11-22 IMAGING — MG DIGITAL SCREENING BILATERAL MAMMOGRAM WITH TOMO AND CAD
8 series · 8 of 24 positions shown · non-contrast
Comparison: None.

CLINICAL DATA: Screening.

EXAM:
DIGITAL SCREENING BILATERAL MAMMOGRAM WITH TOMO AND CAD

[L MLO synth-2D]
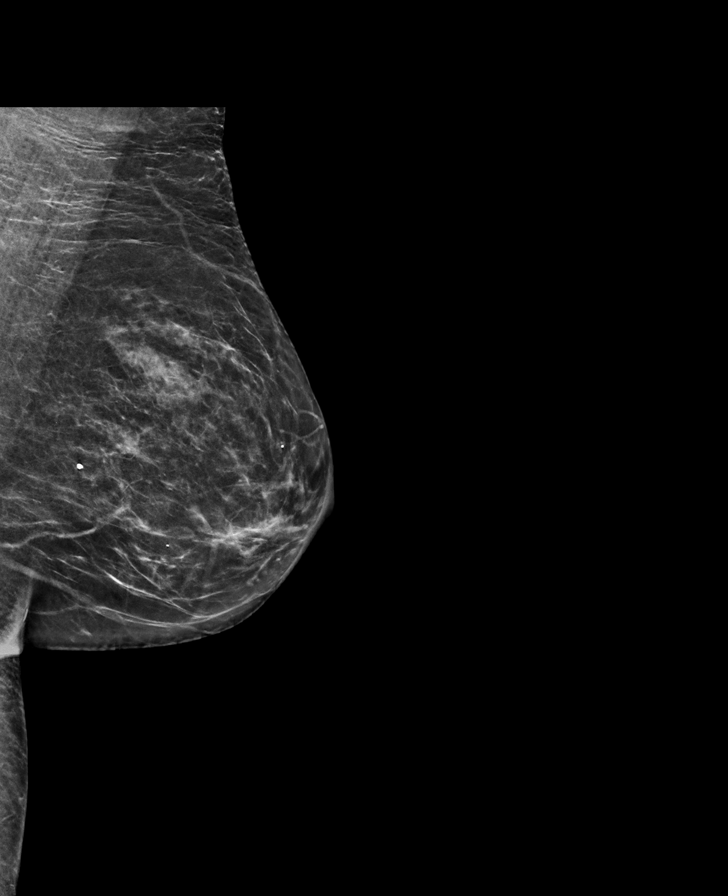

[L CC synth-2D]
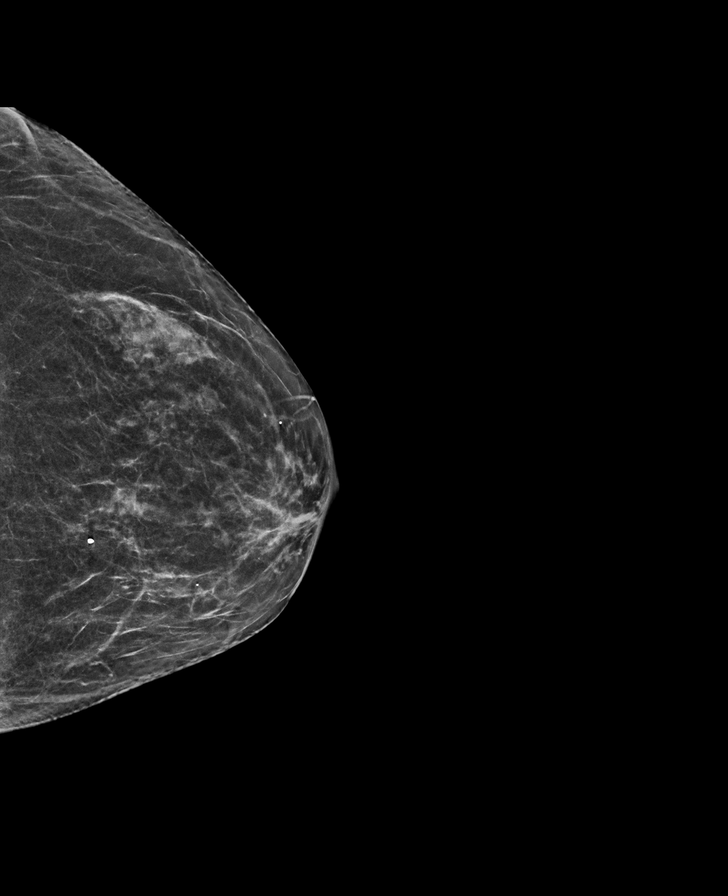

[R CC synth-2D]
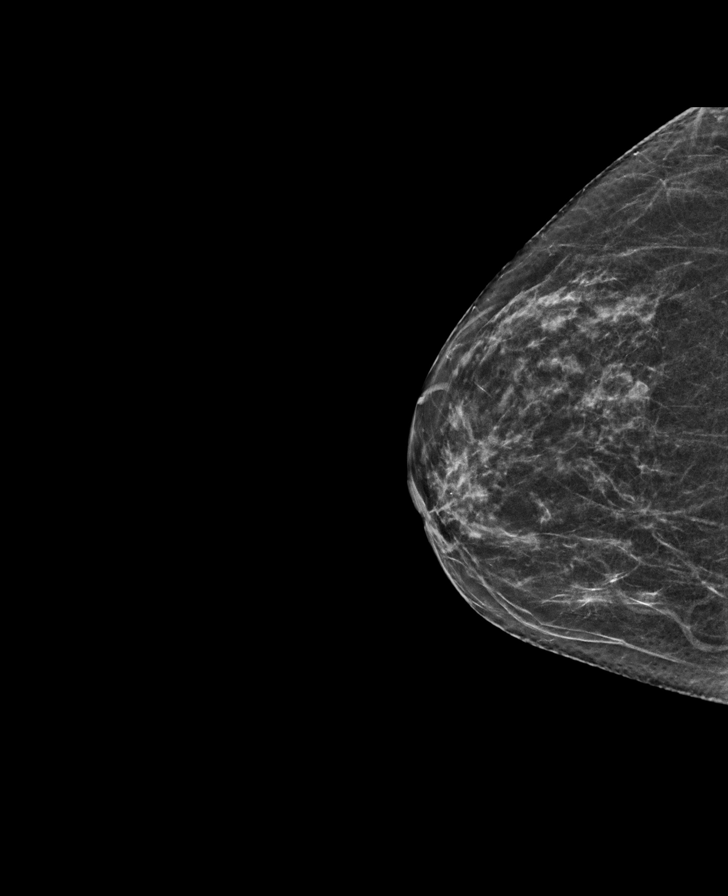

[R MLO synth-2D]
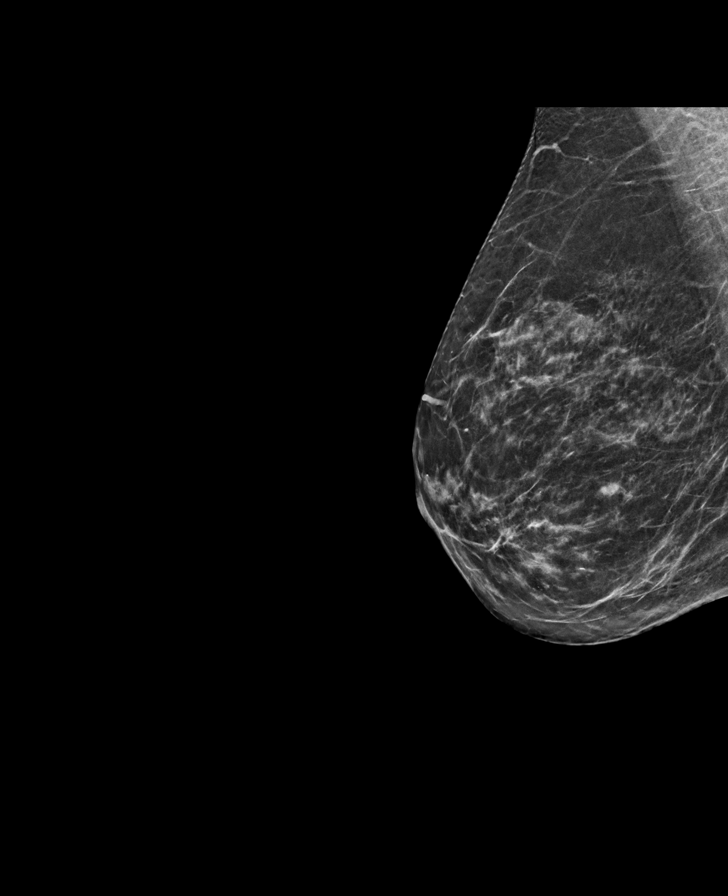

[R CC tomo · tomo slice 29/56.0]
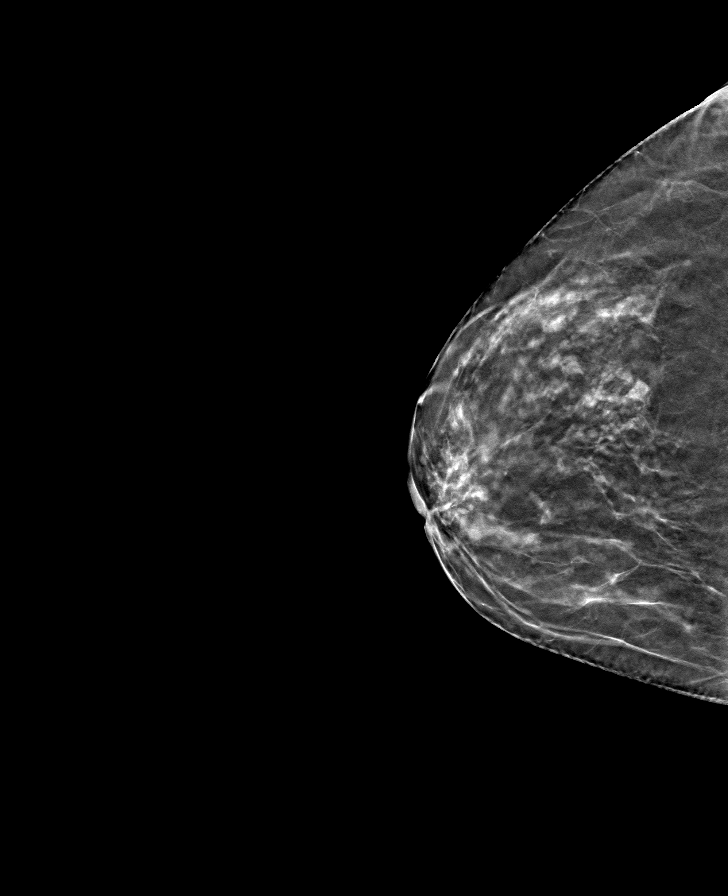

[L CC tomo · tomo slice 29/56.0]
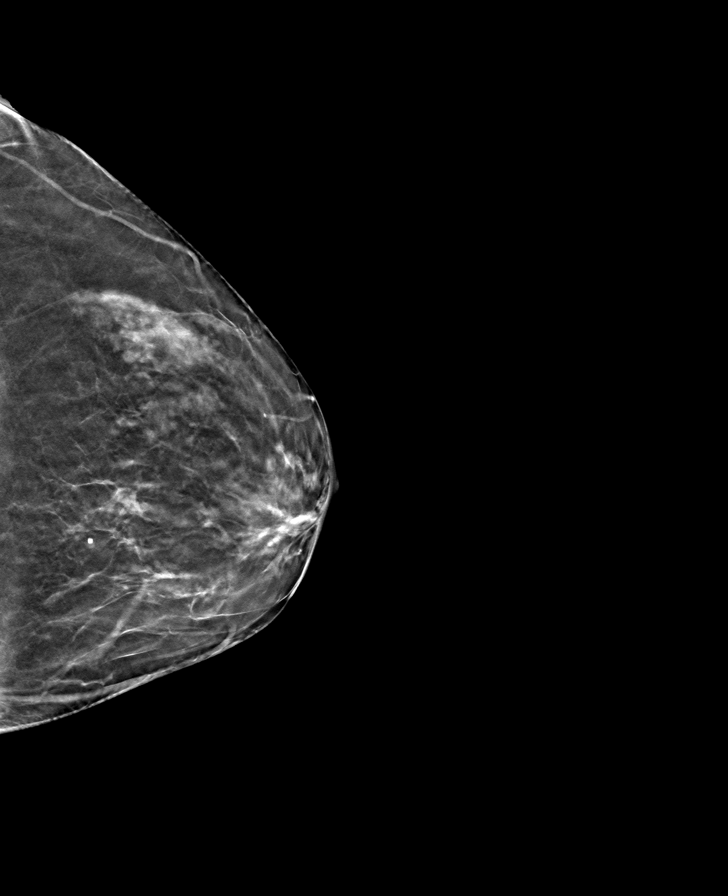

[L MLO tomo · tomo slice 31/62.0]
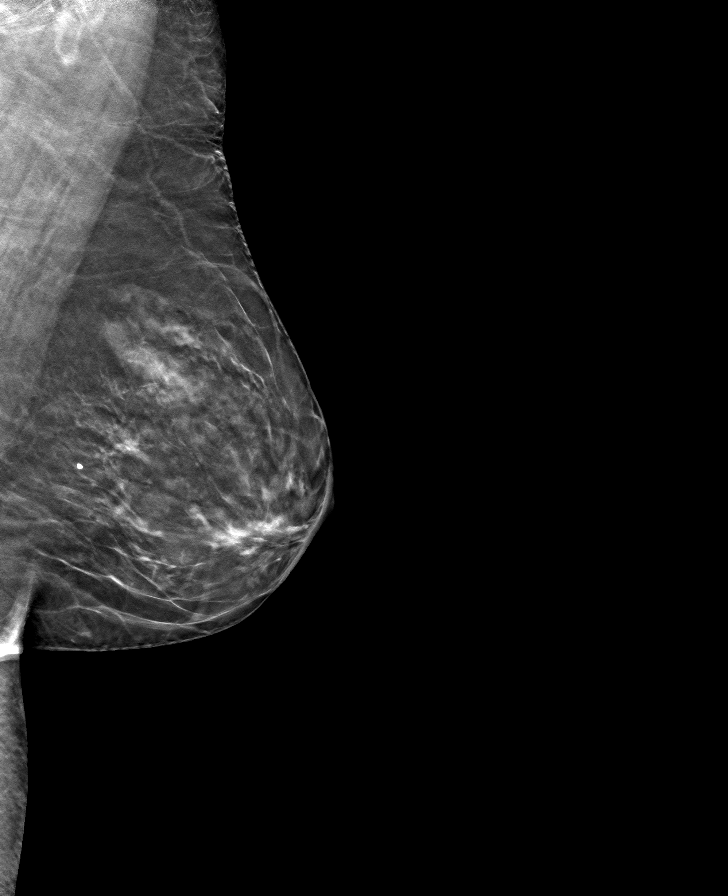

[R MLO tomo · tomo slice 30/59.0]
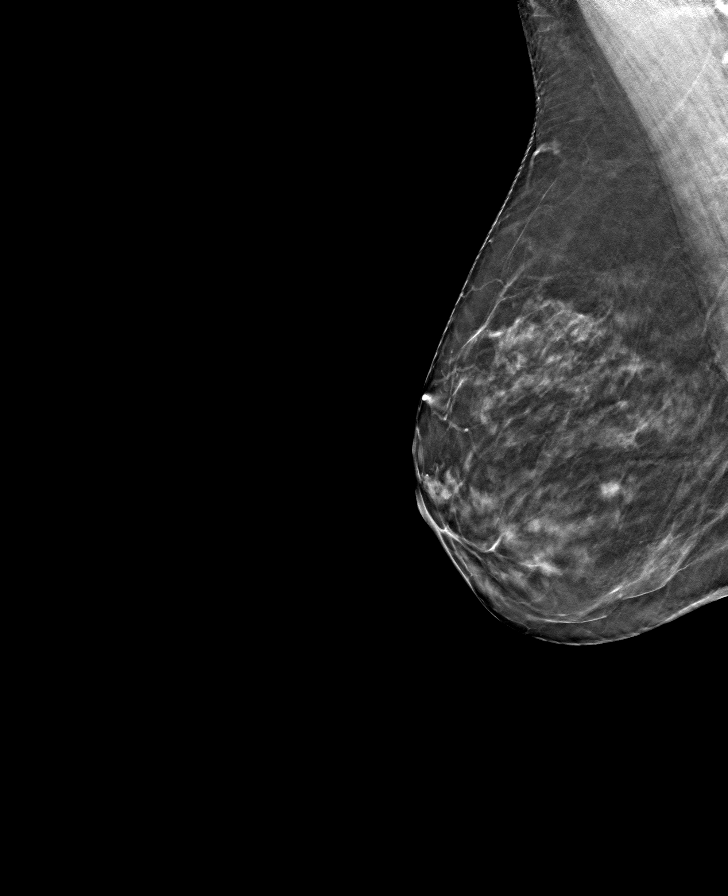

[8 of 24 positions shown; findings below may reference images not displayed]

ACR Breast Density Category c: The breast tissue is heterogeneously
dense, which may obscure small masses.
FINDINGS: In the right breast, a possible asymmetry warrants further
evaluation. In the left breast, no findings suspicious for
malignancy. Images were processed with CAD.
IMPRESSION: Further evaluation is suggested for possible asymmetry in the right
breast.

RECOMMENDATION:
Diagnostic mammogram and possibly ultrasound of the right breast.
(Code:[id number]-X-SSZ)

The patient will be contacted regarding the findings, and additional
imaging will be scheduled.

BI-RADS CATEGORY  0: Incomplete. Need additional imaging evaluation
and/or prior mammograms for comparison.

## 2019-01-21 ENCOUNTER — Ambulatory Visit
Admission: RE | Admit: 2019-01-21 | Discharge: 2019-01-21 | Disposition: A | Discharge status: Home / Self Care | Payer: Medicare HMO | Source: Ambulatory Visit | Attending: Family Medicine | Admitting: Family Medicine

## 2019-01-21 ENCOUNTER — Ambulatory Visit: Payer: Medicare HMO

## 2019-01-21 ENCOUNTER — Other Ambulatory Visit: Payer: Self-pay

## 2019-01-21 DIAGNOSIS — N6489 Other specified disorders of breast: Secondary | ICD-10-CM

## 2019-01-21 DIAGNOSIS — N6320 Unspecified lump in the left breast, unspecified quadrant: Secondary | ICD-10-CM

## 2019-01-22 ENCOUNTER — Telehealth: Payer: Self-pay | Admitting: Family Medicine

## 2019-01-22 NOTE — Telephone Encounter (Signed)
Patient has recommendation from Dr. Dian Situ Davis(physican that read the left breast US radiology report) to see a surgeon for previously biopsy dense fibrosis changing in appearance, excision recommended. Patient has been scheduled at [facility] 02/07/19 to arrive at 9:40AM with Dr. Hassell Done. She is not returning [facility]'s call, they will send her a letter.  This is just an FYI in case the patient calls our office.

## 2019-01-22 NOTE — Telephone Encounter (Signed)
FYI Dr Raoul Pitch

## 2019-02-08 DIAGNOSIS — L720 Epidermal cyst: Secondary | ICD-10-CM

## 2019-02-08 HISTORY — DX: Epidermal cyst: L72.0

## 2019-02-22 ENCOUNTER — Ambulatory Visit: Payer: Self-pay | Admitting: Surgery

## 2019-02-22 DIAGNOSIS — N6320 Unspecified lump in the left breast, unspecified quadrant: Secondary | ICD-10-CM

## 2019-02-26 ENCOUNTER — Other Ambulatory Visit: Payer: Self-pay | Admitting: Surgery

## 2019-02-26 DIAGNOSIS — N6320 Unspecified lump in the left breast, unspecified quadrant: Secondary | ICD-10-CM

## 2019-03-06 ENCOUNTER — Ambulatory Visit: Payer: Medicare HMO

## 2019-03-07 ENCOUNTER — Encounter (HOSPITAL_BASED_OUTPATIENT_CLINIC_OR_DEPARTMENT_OTHER): Payer: Self-pay | Admitting: Surgery

## 2019-03-07 ENCOUNTER — Other Ambulatory Visit: Payer: Self-pay

## 2019-03-11 MED ORDER — ENSURE PRE-SURGERY PO LIQD: 296.0000 mL | Freq: Once | ORAL | Status: DC

## 2019-03-11 NOTE — Progress Notes (Signed)

## 2019-03-12 ENCOUNTER — Other Ambulatory Visit (HOSPITAL_COMMUNITY)
Admission: RE | Admit: 2019-03-12 | Discharge: 2019-03-12 | Disposition: A | Discharge status: Home / Self Care | Payer: Medicare HMO | Source: Ambulatory Visit | Attending: Surgery | Admitting: Surgery

## 2019-03-12 DIAGNOSIS — Z20822 Contact with and (suspected) exposure to covid-19: Secondary | ICD-10-CM | POA: Diagnosis not present

## 2019-03-12 DIAGNOSIS — Z01812 Encounter for preprocedural laboratory examination: Secondary | ICD-10-CM | POA: Diagnosis present

## 2019-03-12 LAB — SARS CORONAVIRUS 2 (TAT 6-24 HRS): SARS Coronavirus 2: NEGATIVE

## 2019-03-14 ENCOUNTER — Other Ambulatory Visit: Payer: Self-pay

## 2019-03-14 ENCOUNTER — Ambulatory Visit
Admission: RE | Admit: 2019-03-14 | Discharge: 2019-03-14 | Disposition: A | Discharge status: Home / Self Care | Payer: Medicare HMO | Source: Ambulatory Visit | Attending: Surgery | Admitting: Surgery

## 2019-03-14 DIAGNOSIS — N6320 Unspecified lump in the left breast, unspecified quadrant: Secondary | ICD-10-CM

## 2019-03-14 NOTE — Anesthesia Preprocedure Evaluation (Addendum)
Anesthesia Evaluation  Patient identified by MRN, date of birth, ID band Patient awake    Reviewed: Allergy & Precautions, NPO status , Patient's Chart, lab work & pertinent test results  History of Anesthesia Complications Negative for: history of anesthetic complications  Airway Mallampati: II  TM Distance: >3 FB Neck ROM: Full    Dental no notable dental hx.    Pulmonary Current Smoker and Patient abstained from smoking.,    Pulmonary exam normal        Cardiovascular negative cardio ROS Normal cardiovascular exam     Neuro/Psych negative neurological ROS  negative psych ROS   GI/Hepatic negative GI ROS, Neg liver ROS,   Endo/Other  negative endocrine ROS  Renal/GU negative Renal ROS  negative genitourinary   Musculoskeletal  (+) Arthritis ,   Abdominal   Peds  Hematology negative hematology ROS (+)   Anesthesia Other Findings Day of surgery medications reviewed with patient.  Reproductive/Obstetrics negative OB ROS                            Anesthesia Physical Anesthesia Plan  ASA: II  Anesthesia Plan: General   Post-op Pain Management:    Induction: Intravenous  PONV Risk Score and Plan: 2 and Treatment may vary due to age or medical condition, Ondansetron, Dexamethasone and Midazolam  Airway Management Planned: LMA  Additional Equipment:   Intra-op Plan:   Post-operative Plan: Extubation in OR  Informed Consent: I have reviewed the patients History and Physical, chart, labs and discussed the procedure including the risks, benefits and alternatives for the proposed anesthesia with the patient or authorized representative who has indicated his/her understanding and acceptance.     Dental advisory given  Plan Discussed with: CRNA  Anesthesia Plan Comments:        Anesthesia Quick Evaluation

## 2019-03-15 ENCOUNTER — Ambulatory Visit (HOSPITAL_BASED_OUTPATIENT_CLINIC_OR_DEPARTMENT_OTHER)
Admission: RE | Admit: 2019-03-15 | Discharge: 2019-03-15 | Disposition: A | Discharge status: Home / Self Care | Payer: Medicare HMO | Attending: Surgery | Admitting: Surgery

## 2019-03-15 ENCOUNTER — Ambulatory Visit: Payer: Self-pay

## 2019-03-15 ENCOUNTER — Ambulatory Visit (HOSPITAL_BASED_OUTPATIENT_CLINIC_OR_DEPARTMENT_OTHER): Payer: Medicare HMO | Admitting: Anesthesiology

## 2019-03-15 ENCOUNTER — Encounter (HOSPITAL_BASED_OUTPATIENT_CLINIC_OR_DEPARTMENT_OTHER): Payer: Self-pay | Admitting: Surgery

## 2019-03-15 ENCOUNTER — Encounter (HOSPITAL_BASED_OUTPATIENT_CLINIC_OR_DEPARTMENT_OTHER)
Admission: RE | Disposition: A | Discharge status: Home / Self Care | Payer: Self-pay | Source: Home / Self Care | Attending: Surgery

## 2019-03-15 ENCOUNTER — Ambulatory Visit
Admission: RE | Admit: 2019-03-15 | Discharge: 2019-03-15 | Disposition: A | Discharge status: Home / Self Care | Payer: Medicare HMO | Source: Ambulatory Visit | Attending: Surgery | Admitting: Surgery

## 2019-03-15 ENCOUNTER — Other Ambulatory Visit: Payer: Self-pay

## 2019-03-15 DIAGNOSIS — N6012 Diffuse cystic mastopathy of left breast: Secondary | ICD-10-CM | POA: Diagnosis not present

## 2019-03-15 DIAGNOSIS — Z8349 Family history of other endocrine, nutritional and metabolic diseases: Secondary | ICD-10-CM | POA: Insufficient documentation

## 2019-03-15 DIAGNOSIS — F17290 Nicotine dependence, other tobacco product, uncomplicated: Secondary | ICD-10-CM | POA: Diagnosis not present

## 2019-03-15 DIAGNOSIS — N6320 Unspecified lump in the left breast, unspecified quadrant: Secondary | ICD-10-CM | POA: Diagnosis present

## 2019-03-15 DIAGNOSIS — H409 Unspecified glaucoma: Secondary | ICD-10-CM | POA: Diagnosis not present

## 2019-03-15 DIAGNOSIS — R920 Mammographic microcalcification found on diagnostic imaging of breast: Secondary | ICD-10-CM | POA: Diagnosis not present

## 2019-03-15 DIAGNOSIS — Z86010 Personal history of colonic polyps: Secondary | ICD-10-CM | POA: Insufficient documentation

## 2019-03-15 DIAGNOSIS — E785 Hyperlipidemia, unspecified: Secondary | ICD-10-CM | POA: Insufficient documentation

## 2019-03-15 DIAGNOSIS — Z8249 Family history of ischemic heart disease and other diseases of the circulatory system: Secondary | ICD-10-CM | POA: Diagnosis not present

## 2019-03-15 DIAGNOSIS — M1990 Unspecified osteoarthritis, unspecified site: Secondary | ICD-10-CM | POA: Diagnosis not present

## 2019-03-15 HISTORY — PX: RADIOACTIVE SEED GUIDED EXCISIONAL BREAST BIOPSY: SHX6490

## 2019-03-15 HISTORY — DX: Unspecified glaucoma: H40.9

## 2019-03-15 SURGERY — RADIOACTIVE SEED GUIDED BREAST BIOPSY
Anesthesia: General | Site: Breast | Laterality: Left

## 2019-03-15 MED ORDER — OXYCODONE HCL 5 MG/5ML PO SOLN: 5.0000 mg | Freq: Once | ORAL | Status: AC | PRN

## 2019-03-15 MED ORDER — MIDAZOLAM HCL 2 MG/2ML IJ SOLN
1.0000 mg | INTRAMUSCULAR | Status: DC | PRN
  Administered 2019-03-15: 08:00:00 1 mg via INTRAVENOUS

## 2019-03-15 MED ORDER — PROPOFOL 500 MG/50ML IV EMUL
INTRAVENOUS | Status: AC
  Filled 2019-03-15: qty 50

## 2019-03-15 MED ORDER — CHLORHEXIDINE GLUCONATE CLOTH 2 % EX PADS: 6.0000 | MEDICATED_PAD | Freq: Once | CUTANEOUS | Status: DC

## 2019-03-15 MED ORDER — SCOPOLAMINE 1 MG/3DAYS TD PT72
MEDICATED_PATCH | TRANSDERMAL | Status: AC
  Filled 2019-03-15: qty 1

## 2019-03-15 MED ORDER — DEXAMETHASONE SODIUM PHOSPHATE 10 MG/ML IJ SOLN
INTRAMUSCULAR | Status: AC
  Filled 2019-03-15: qty 1

## 2019-03-15 MED ORDER — BUPIVACAINE LIPOSOME 1.3 % IJ SUSP: 20.0000 mL | Freq: Once | INTRAMUSCULAR | Status: DC

## 2019-03-15 MED ORDER — ONDANSETRON HCL 4 MG/2ML IJ SOLN
INTRAMUSCULAR | Status: AC
  Filled 2019-03-15: qty 2

## 2019-03-15 MED ORDER — EPHEDRINE 5 MG/ML INJ
INTRAVENOUS | Status: AC
  Filled 2019-03-15: qty 10

## 2019-03-15 MED ORDER — SUCCINYLCHOLINE CHLORIDE 200 MG/10ML IV SOSY
PREFILLED_SYRINGE | INTRAVENOUS | Status: AC
  Filled 2019-03-15: qty 10

## 2019-03-15 MED ORDER — BUPIVACAINE-EPINEPHRINE (PF) 0.25% -1:200000 IJ SOLN
INTRAMUSCULAR | Status: DC | PRN
  Administered 2019-03-15: 10 mL

## 2019-03-15 MED ORDER — PHENYLEPHRINE 40 MCG/ML (10ML) SYRINGE FOR IV PUSH (FOR BLOOD PRESSURE SUPPORT)
PREFILLED_SYRINGE | INTRAVENOUS | Status: AC
  Filled 2019-03-15: qty 10

## 2019-03-15 MED ORDER — PROPOFOL 10 MG/ML IV BOLUS
INTRAVENOUS | Status: DC | PRN
  Administered 2019-03-15: 158 mg via INTRAVENOUS

## 2019-03-15 MED ORDER — CEFAZOLIN SODIUM-DEXTROSE 2-4 GM/100ML-% IV SOLN
2.0000 g | INTRAVENOUS | Status: AC
  Administered 2019-03-15: 07:00:00 2 g via INTRAVENOUS

## 2019-03-15 MED ORDER — LIDOCAINE HCL (CARDIAC) PF 100 MG/5ML IV SOSY
PREFILLED_SYRINGE | INTRAVENOUS | Status: DC | PRN
  Administered 2019-03-15: 60 mg via INTRAVENOUS

## 2019-03-15 MED ORDER — ACETAMINOPHEN 500 MG PO TABS
1000.0000 mg | ORAL_TABLET | Freq: Once | ORAL | Status: AC
  Administered 2019-03-15: 1000 mg via ORAL

## 2019-03-15 MED ORDER — DEXAMETHASONE SODIUM PHOSPHATE 4 MG/ML IJ SOLN
INTRAMUSCULAR | Status: DC | PRN
  Administered 2019-03-15: 5 mg via INTRAVENOUS

## 2019-03-15 MED ORDER — MIDAZOLAM HCL 2 MG/2ML IJ SOLN
INTRAMUSCULAR | Status: AC
  Filled 2019-03-15: qty 2

## 2019-03-15 MED ORDER — LIDOCAINE 2% (20 MG/ML) 5 ML SYRINGE
INTRAMUSCULAR | Status: AC
  Filled 2019-03-15: qty 5

## 2019-03-15 MED ORDER — PROMETHAZINE HCL 25 MG/ML IJ SOLN: 6.2500 mg | INTRAMUSCULAR | Status: DC | PRN

## 2019-03-15 MED ORDER — CEFAZOLIN SODIUM-DEXTROSE 2-4 GM/100ML-% IV SOLN
INTRAVENOUS | Status: AC
  Filled 2019-03-15: qty 100

## 2019-03-15 MED ORDER — LACTATED RINGERS IV SOLN: INTRAVENOUS | Status: DC

## 2019-03-15 MED ORDER — FENTANYL CITRATE (PF) 100 MCG/2ML IJ SOLN
INTRAMUSCULAR | Status: AC
  Filled 2019-03-15: qty 2

## 2019-03-15 MED ORDER — SCOPOLAMINE 1 MG/3DAYS TD PT72: 1.0000 | MEDICATED_PATCH | TRANSDERMAL | Status: DC

## 2019-03-15 MED ORDER — FENTANYL CITRATE (PF) 100 MCG/2ML IJ SOLN: 25.0000 ug | INTRAMUSCULAR | Status: DC | PRN

## 2019-03-15 MED ORDER — OXYCODONE HCL 5 MG PO TABS
ORAL_TABLET | ORAL | Status: AC
  Filled 2019-03-15: qty 1

## 2019-03-15 MED ORDER — ACETAMINOPHEN 500 MG PO TABS
ORAL_TABLET | ORAL | Status: AC
  Filled 2019-03-15: qty 2

## 2019-03-15 MED ORDER — HYDROCODONE-ACETAMINOPHEN 5-325 MG PO TABS: 1.0000 | ORAL_TABLET | Freq: Four times a day (QID) | ORAL | 0 refills | Status: DC | PRN

## 2019-03-15 MED ORDER — OXYCODONE HCL 5 MG PO TABS
5.0000 mg | ORAL_TABLET | Freq: Once | ORAL | Status: AC | PRN
  Administered 2019-03-15: 5 mg via ORAL

## 2019-03-15 MED ORDER — FENTANYL CITRATE (PF) 100 MCG/2ML IJ SOLN
50.0000 ug | INTRAMUSCULAR | Status: DC | PRN
  Administered 2019-03-15 (×2): 50 ug via INTRAVENOUS

## 2019-03-15 SURGICAL SUPPLY — 55 items
ADH SKN CLS APL DERMABOND .7 (GAUZE/BANDAGES/DRESSINGS) ×1
APL SKNCLS STERI-STRIP NONHPOA (GAUZE/BANDAGES/DRESSINGS)
APPLIER CLIP 9.375 MED OPEN (MISCELLANEOUS)
APR CLP MED 9.3 20 MLT OPN (MISCELLANEOUS)
BENZOIN TINCTURE PRP APPL 2/3 (GAUZE/BANDAGES/DRESSINGS) IMPLANT
BINDER BREAST LRG (GAUZE/BANDAGES/DRESSINGS) IMPLANT
BINDER BREAST MEDIUM (GAUZE/BANDAGES/DRESSINGS) IMPLANT
BINDER BREAST XLRG (GAUZE/BANDAGES/DRESSINGS) IMPLANT
BINDER BREAST XXLRG (GAUZE/BANDAGES/DRESSINGS) IMPLANT
BLADE HEX COATED 2.75 (ELECTRODE) IMPLANT
BLADE SURG 10 STRL SS (BLADE) IMPLANT
BLADE SURG 15 STRL LF DISP TIS (BLADE) ×1 IMPLANT
BLADE SURG 15 STRL SS (BLADE) ×3
CANISTER SUC SOCK COL 7IN (MISCELLANEOUS) IMPLANT
CANISTER SUCT 1200ML W/VALVE (MISCELLANEOUS) ×3 IMPLANT
CLIP APPLIE 9.375 MED OPEN (MISCELLANEOUS) IMPLANT
CLIP VESOCCLUDE SM WIDE 6/CT (CLIP) IMPLANT
CLOSURE WOUND 1/4X4 (GAUZE/BANDAGES/DRESSINGS)
COVER BACK TABLE 60X90IN (DRAPES) ×3 IMPLANT
COVER MAYO STAND STRL (DRAPES) ×3 IMPLANT
COVER PROBE W GEL 5X96 (DRAPES) ×3 IMPLANT
COVER WAND RF STERILE (DRAPES) IMPLANT
DECANTER SPIKE VIAL GLASS SM (MISCELLANEOUS) IMPLANT
DERMABOND ADVANCED (GAUZE/BANDAGES/DRESSINGS) ×2
DERMABOND ADVANCED .7 DNX12 (GAUZE/BANDAGES/DRESSINGS) ×1 IMPLANT
DRAPE LAPAROTOMY 100X72 PEDS (DRAPES) ×3 IMPLANT
DRSG PAD ABDOMINAL 8X10 ST (GAUZE/BANDAGES/DRESSINGS) IMPLANT
ELECT COATED BLADE 2.86 ST (ELECTRODE) ×3 IMPLANT
ELECT REM PT RETURN 9FT ADLT (ELECTROSURGICAL) ×3
ELECTRODE REM PT RTRN 9FT ADLT (ELECTROSURGICAL) ×1 IMPLANT
GAUZE SPONGE 4X4 12PLY STRL LF (GAUZE/BANDAGES/DRESSINGS) IMPLANT
GLOVE BIO SURGEON STRL SZ8 (GLOVE) ×3 IMPLANT
GOWN STRL REUS W/ TWL LRG LVL3 (GOWN DISPOSABLE) ×1 IMPLANT
GOWN STRL REUS W/ TWL XL LVL3 (GOWN DISPOSABLE) ×1 IMPLANT
GOWN STRL REUS W/TWL LRG LVL3 (GOWN DISPOSABLE) ×3
GOWN STRL REUS W/TWL XL LVL3 (GOWN DISPOSABLE) ×3
KIT MARKER MARGIN INK (KITS) ×3 IMPLANT
NDL HYPO 25X1 1.5 SAFETY (NEEDLE) ×1 IMPLANT
NEEDLE HYPO 25X1 1.5 SAFETY (NEEDLE) ×3 IMPLANT
NS IRRIG 1000ML POUR BTL (IV SOLUTION) IMPLANT
PACK BASIN DAY SURGERY FS (CUSTOM PROCEDURE TRAY) ×3 IMPLANT
PENCIL SMOKE EVACUATOR (MISCELLANEOUS) ×3 IMPLANT
SCRUB TECHNI CARE 4 OZ NO DYE (MISCELLANEOUS) ×3 IMPLANT
SHEET MEDIUM DRAPE 40X70 STRL (DRAPES) ×3 IMPLANT
SLEEVE SCD COMPRESS KNEE MED (MISCELLANEOUS) ×3 IMPLANT
SPONGE LAP 18X18 RF (DISPOSABLE) ×3 IMPLANT
STRIP CLOSURE SKIN 1/4X4 (GAUZE/BANDAGES/DRESSINGS) IMPLANT
SUT MNCRL AB 4-0 PS2 18 (SUTURE) IMPLANT
SUT VIC AB 4-0 SH 18 (SUTURE) ×3 IMPLANT
SUT VICRYL 3-0 CR8 SH (SUTURE) IMPLANT
TOWEL GREEN STERILE FF (TOWEL DISPOSABLE) ×3 IMPLANT
TRAY FAXITRON CT DISP (TRAY / TRAY PROCEDURE) ×3 IMPLANT
TUBE CONNECTING 20'X1/4 (TUBING) ×1
TUBE CONNECTING 20X1/4 (TUBING) ×2 IMPLANT
YANKAUER SUCT BULB TIP NO VENT (SUCTIONS) ×3 IMPLANT

## 2019-03-15 NOTE — Anesthesia Postprocedure Evaluation (Signed)
Anesthesia Post Note  Patient: Angela [name]  Procedure(s) Performed: RADIOACTIVE SEED GUIDED EXCISIONAL LEFT  BREAST BIOPSY (Left Breast)     Patient location during evaluation: PACU Anesthesia Type: General Level of consciousness: awake and alert and oriented Pain management: pain level controlled Vital Signs Assessment: post-procedure vital signs reviewed and stable Respiratory status: spontaneous breathing, nonlabored ventilation and respiratory function stable Cardiovascular status: blood pressure returned to baseline Postop Assessment: no apparent nausea or vomiting Anesthetic complications: no    Last Vitals:  Vitals:   03/15/19 0848 03/15/19 0900  BP: (!) 155/89   Pulse: 81 72  Resp: 20 14  Temp: 36.5 C   SpO2: 97% 94%    Last Pain:  Vitals:   03/15/19 0900  TempSrc:   PainSc: 4                  Kaylyn Layer

## 2019-03-15 NOTE — Op Note (Signed)
AQUITA SIMMERING  1950-04-07 15 March 2019    PCP:  Natalia Leatherwood, DO   Surgeon: Wenda Low, MD, FACS  Asst:  none  Anes:  general  Preop Dx: Complex mass in left breast at 4 oclock Postop Dx: same  Procedure: Radioactive seed localized left breast biopsy Location Surgery: CDS 8 Complications: none  EBL:   minimal cc  Drains: none  Description of Procedure:  The patient was taken to OR 8 .  After anesthesia was administered and the patient was prepped  with chloroprep and a timeout was performed.  The hot area was at 4 oclock and a curvilinear incision was made over it.  I carried my dissection down feeling the mass and using the bovie and a kelly clamp to dissect around the mass.  The seed was on the periphery and was sent separately.  The cavity was irrigated and inspected and no bleeding was noted.  The wound was closed in layers after lidocaine injection with vicryl 4-0 and monocryl 4-0 and Dermabond.    The patient tolerated the procedure well and was taken to the PACU in stable condition.     Matt B. Daphine Deutscher, MD, [facility], [place] [phone]

## 2019-03-15 NOTE — Transfer of Care (Signed)
Immediate Anesthesia Transfer of Care Note  Patient: Angela [name]  Procedure(s) Performed: RADIOACTIVE SEED GUIDED EXCISIONAL LEFT  BREAST BIOPSY (Left Breast)  Patient Location: PACU  Anesthesia Type:General  Level of Consciousness: awake, alert  and patient cooperative  Airway & Oxygen Therapy: Patient Spontanous Breathing  Post-op Assessment: Report given to RN and Post -op Vital signs reviewed and stable  Post vital signs: Reviewed and stable  Last Vitals:  Vitals Value Taken Time  BP 155/89 03/15/19 0846  Temp    Pulse 81 03/15/19 0848  Resp 20 03/15/19 0848  SpO2 97 % 03/15/19 0848  Vitals shown include unvalidated device data.  Last Pain:  Vitals:   03/15/19 0631  TempSrc: Oral  PainSc: 0-No pain         Complications: No apparent anesthesia complications

## 2019-03-15 NOTE — Anesthesia Procedure Notes (Addendum)
Procedure Name: LMA Insertion Date/Time: 03/15/2019 7:39 AM Performed by: Ronnette Hila, CRNA Pre-anesthesia Checklist: Patient identified, Emergency Drugs available, Suction available and Patient being monitored Patient Re-evaluated:Patient Re-evaluated prior to induction Oxygen Delivery Method: Circle system utilized Preoxygenation: Pre-oxygenation with 100% oxygen Induction Type: IV induction Ventilation: Mask ventilation without difficulty LMA: LMA inserted LMA Size: 4.0 Number of attempts: 1 Airway Equipment and Method: Bite block Placement Confirmation: positive ETCO2 Tube secured with: Tape Dental Injury: Teeth and Oropharynx as per pre-operative assessment

## 2019-03-15 NOTE — Discharge Instructions (Signed)
Breast Biopsy, Care After These instructions give you information about caring for yourself after your procedure. Your doctor may also give you more specific instructions. Call your doctor if you have any problems or questions after your procedure. What can I expect after the procedure? After your procedure, it is common to have:  Bruising on your breast.  Numbness, tingling, or pain near your biopsy site. Follow these instructions at home: Medicines  Take over-the-counter and prescription medicines only as told by your doctor.  Do not drive for 24 hours if you were given a medicine to help you relax (sedative) during your procedure.  Do not drink alcohol while taking pain medicine.  Do not drive or use heavy machinery while taking prescription pain medicine. Biopsy site care      Follow instructions from your doctor about how to take care of your cut from surgery (incision) or your puncture area. Make sure you: ? Wash your hands with soap and water before you change your bandage (dressing). If you cannot use soap and water, use hand sanitizer. ? Change your bandage as told by your doctor. ? Leave stitches (sutures), skin glue, or skin tape (adhesive strips) in place. They may need to stay in place for 2 weeks or longer. If tape strips get loose and curl up, you may trim the loose edges. Do not remove tape strips completely unless your doctor says it is okay.  If you have stitches, keep them dry when you take a bath or a shower.  Check your cut or puncture area every day for signs of infection. Check for: ? Redness, swelling, or pain. ? Fluid or blood. ? Warmth. ? Pus or a bad smell.  Protect the biopsy area. Do not let the area get bumped. Activity  If you had a cut during your procedure, avoid activities that could pull your cut open. These include: ? Stretching. ? Reaching over your head. ? Exercise. ? Sports. ? Lifting anything that weighs more than 3 lb (1.4  kg).  Return to your normal activities as told by your doctor. Ask your doctor what activities are safe for you. Managing pain, stiffness, and swelling If told, put ice on the biopsy site to relieve swelling:  Put ice in a plastic bag.  Place a towel between your skin and the bag.  Leave the ice on for 20 minutes, 2-3 times a day. General instructions  Continue your normal diet.  Wear a good support bra for as long as told by your doctor.  Get checked for extra fluid around your lymph nodes (lymphedema) as often as told by your doctor.  Keep all follow-up visits as told by your doctor. This is important. Contact a doctor if:  You notice any of the following at the biopsy site: ? More redness, swelling, or pain. ? More fluid or blood coming from the site. ? The site feels warm to the touch. ? Pus or a bad smell coming from the site. ? The site breaks open after the stitches or skin tape strips have been removed.  You have a rash.  You have a fever. Get help right away if:  You have more bleeding from the biopsy site. Get help right away if bleeding is more than a small spot.  You have trouble breathing.  You have red streaks around the biopsy site. Summary  After your procedure, it is common to have bruising, numbness, tingling, or pain near the biopsy site.  Do not drive   or use heavy machinery while taking prescription pain medicine.  Wear a good support bra for as long as told by your doctor.  If you had a cut during your procedure, avoid activities that may pull the cut open. Ask your doctor what activities are safe for you. This information is not intended to replace advice given to you by your health care provider. Make sure you discuss any questions you have with your health care provider. Document Revised: 07/13/2017 Document Reviewed: 07/13/2017 Elsevier Patient Education  [place] Instructions  Activity: Get plenty  of rest for the remainder of the day. A responsible individual must stay with you for 24 hours following the procedure.  For the next 24 hours, DO NOT: -Drive a car -Paediatric nurse -Drink alcoholic beverages -Take any medication unless instructed by your physician -Make any legal decisions or sign important papers.  Meals: Start with liquid foods such as gelatin or soup. Progress to regular foods as tolerated. Avoid greasy, spicy, heavy foods. If nausea and/or vomiting occur, drink only clear liquids until the nausea and/or vomiting subsides. Call your physician if vomiting continues.  Special Instructions/Symptoms: Your throat may feel dry or sore from the anesthesia or the breathing tube placed in your throat during surgery. If this causes discomfort, gargle with warm salt water. The discomfort should disappear within 24 hours.  If you had a scopolamine patch placed behind your ear for the management of post- operative nausea and/or vomiting:  1. The medication in the patch is effective for 72 hours, after which it should be removed.  Wrap patch in a tissue and discard in the trash. Wash hands thoroughly with soap and water. 2. You may remove the patch earlier than 72 hours if you experience unpleasant side effects which may include dry mouth, dizziness or visual disturbances. 3. Avoid touching the patch. Wash your hands with soap and water after contact with the patch.     Information for Discharge Teaching: EXPAREL (bupivacaine liposome injectable suspension)   Your surgeon or anesthesiologist gave you EXPAREL(bupivacaine) to help control your pain after surgery.   EXPAREL is a local anesthetic that provides pain relief by numbing the tissue around the surgical site.  EXPAREL is designed to release pain medication over time and can control pain for up to 72 hours.  Depending on how you respond to EXPAREL, you may require less pain medication during your recovery.  Possible side  effects:  Temporary loss of sensation or ability to move in the area where bupivacaine was injected.  Nausea, vomiting, constipation  Rarely, numbness and tingling in your mouth or lips, lightheadedness, or anxiety may occur.  Call your doctor right away if you think you may be experiencing any of these sensations, or if you have other questions regarding possible side effects.  Follow all other discharge instructions given to you by your surgeon or nurse. Eat a healthy diet and drink plenty of water or other fluids.  If you return to the hospital for any reason within 96 hours following the administration of EXPAREL, it is important for health care providers to know that you have received this anesthetic. A teal colored band has been placed on your arm with the date, time and amount of EXPAREL you have received in order to alert and inform your health care providers. Please leave this armband in place for the full 96 hours following administration, and then you may remove the band.   Post Anesthesia  Home Care Instructions  Activity: Get plenty of rest for the remainder of the day. A responsible individual must stay with you for 24 hours following the procedure.  For the next 24 hours, DO NOT: -Drive a car -Advertising copywriter -Drink alcoholic beverages -Take any medication unless instructed by your physician -Make any legal decisions or sign important papers.  Meals: Start with liquid foods such as gelatin or soup. Progress to regular foods as tolerated. Avoid greasy, spicy, heavy foods. If nausea and/or vomiting occur, drink only clear liquids until the nausea and/or vomiting subsides. Call your physician if vomiting continues.  Special Instructions/Symptoms: Your throat may feel dry or sore from the anesthesia or the breathing tube placed in your throat during surgery. If this causes discomfort, gargle with warm salt water. The discomfort should disappear within 24 hours.  If you had  a scopolamine patch placed behind your ear for the management of post- operative nausea and/or vomiting:  1. The medication in the patch is effective for 72 hours, after which it should be removed.  Wrap patch in a tissue and discard in the trash. Wash hands thoroughly with soap and water. 2. You may remove the patch earlier than 72 hours if you experience unpleasant side effects which may include dry mouth, dizziness or visual disturbances. 3. Avoid touching the patch. Wash your hands with soap and water after contact with the patch.

## 2019-03-15 NOTE — Interval H&P Note (Signed)
History and Physical Interval Note:  03/15/2019 7:16 AM  Angela [name]  has presented today for surgery, with the diagnosis of left breast mass.  The various methods of treatment have been discussed with the patient and family. After consideration of risks, benefits and other options for treatment, the patient has consented to  Procedure(s): RADIOACTIVE SEED GUIDED EXCISIONAL LEFT  BREAST BIOPSY (Left) as a surgical intervention.  The patient's history has been reviewed, patient examined, no change in status, stable for surgery.  I have reviewed the patient's chart and labs.  Questions were answered to the patient's satisfaction.     Valarie Merino

## 2019-03-15 NOTE — H&P (Signed)
Chief Complaint:  Left breast complex mass  History of Present Illness:  Angela [name] is an 69 y.o. female who had a scarred area biopsied on the left breast.  Here for radioactive seed localization and LBB.    Past Medical History:  Diagnosis Date  . Arthritis   . Blood in stool    greater than 4 years since last incidence of blood in stool   . Colon polyps   . Glaucoma   . Hyperlipidemia   . PNA (pneumonia) 2006    Past Surgical History:  Procedure Laterality Date  . BREAST BIOPSY Left 07/19/2018   US guided breast bx with clip placement.   Marland Kitchen EYE SURGERY  1956  . [place]  2001  . TONSILLECTOMY  1963  . WISDOM TOOTH EXTRACTION      Current Facility-Administered Medications  Medication Dose Route Frequency Provider Last Rate Last Admin  . bupivacaine liposome (EXPAREL) 1.3 % injection 266 mg  20 mL Infiltration Once Johnathan Hausen, MD      . ceFAZolin (ANCEF) IVPB 2g/100 mL premix  2 g Intravenous On Call to OR Johnathan Hausen, MD      . Chlorhexidine Gluconate Cloth 2 % PADS 6 each  6 each Topical Once Johnathan Hausen, MD       And  . Chlorhexidine Gluconate Cloth 2 % PADS 6 each  6 each Topical Once Johnathan Hausen, MD      . feeding supplement (ENSURE PRE-SURGERY) liquid 296 mL  296 mL Oral Once Johnathan Hausen, MD      . fentaNYL (SUBLIMAZE) injection 50 mcg  50 mcg Intravenous Q5 min PRN Lidia Collum, MD      . lactated ringers infusion   Intravenous Continuous Lidia Collum, MD      . midazolam (VERSED) injection 1-2 mg  1-2 mg Intravenous Q5 min PRN Lidia Collum, MD      . scopolamine (TRANSDERM-SCOP) 1 MG/3DAYS 1.5 mg  1 patch Transdermal On Call to OR Johnathan Hausen, MD       Codeine and Pollen extract Family History  Problem Relation Age of Onset  . Heart attack Father   . Heart attack Sister   . Hyperlipidemia Sister   . Hypertension Sister    Social History:   reports that she has been smoking cigars. She has a 15.00 pack-year  smoking history. She has never used smokeless tobacco. She reports current alcohol use of about 4.0 standard drinks of alcohol per week. She reports that she does not use drugs.   REVIEW OF SYSTEMS : Negative except for see problem list  Physical Exam:   Blood pressure 121/71, pulse 73, temperature 98.4 F (36.9 C), temperature source Oral, resp. rate 16, height '5\' 4"'  (1.626 m), weight 65 kg, SpO2 99 %. Body mass index is 24.6 kg/m.  Gen:  WDWN WF NAD  Neurological: Alert and oriented to person, place, and time. Motor and sensory function is grossly intact  Head: Normocephalic and atraumatic.  Eyes: Conjunctivae are normal. Pupils are equal, round, and reactive to light. No scleral icterus.  Neck: Normal range of motion. Neck supple. No tracheal deviation or thyromegaly present.  Cardiovascular:  SR without murmurs or gallops.  No carotid bruits Breast:  Bruise on left side at site of seed placement Respiratory: Effort normal.  No respiratory distress. No chest wall tenderness. Breath sounds normal.  No wheezes, rales or rhonchi.  Abdomen:  nontender  GU:  Not examined Musculoskeletal: Normal  range of motion. Extremities are nontender. No cyanosis, edema or clubbing noted Lymphadenopathy: No cervical, preauricular, postauricular or axillary adenopathy is present Skin: Skin is warm and dry. No rash noted. No diaphoresis. No erythema. No pallor. Pscyh: Normal mood and affect. Behavior is normal. Judgment and thought content normal.   LABORATORY RESULTS: No results found for this or any previous visit (from the past 48 hour(s)).   RADIOLOGY RESULTS: MM LT RADIOACTIVE SEED LOC MAMMO GUIDE  Result Date: 03/14/2019 CLINICAL DATA:  Localization for surgery EXAM: MAMMOGRAPHIC GUIDED RADIOACTIVE SEED LOCALIZATION OF THE LEFT BREAST COMPARISON:  Previous exam(s). FINDINGS: Patient presents for radioactive seed localization prior to surgery. I met with the patient and we discussed the procedure  of seed localization including benefits and alternatives. We discussed the high likelihood of a successful procedure. We discussed the risks of the procedure including infection, bleeding, tissue injury and further surgery. We discussed the low dose of radioactivity involved in the procedure. Informed, written consent was given. The usual time-out protocol was performed immediately prior to the procedure. Using mammographic guidance, sterile technique, 1% lidocaine and an I-125 radioactive seed, the ribbon shaped biopsy clip and associated mass was localized using a lateral approach. The follow-up mammogram images confirm the seed in the expected location and were marked for the surgeon. Follow-up survey of the patient confirms presence of the radioactive seed. Order number of I-125 seed:  [id number]. Total activity:  5.366 millicuries reference Date: January 30, 2019 The patient tolerated the procedure well and was released from the [facility]. She was given instructions regarding seed removal. IMPRESSION: Radioactive seed localization left breast. No apparent complications. Electronically Signed   By: Dorise Bullion III M.D   On: 03/14/2019 13:21    Problem List: Patient Active Problem List   Diagnosis Date Noted  . Abnormal mammogram 05/15/2017  . Overweight (BMI 25.0-29.9) 04/10/2017    Assessment & Plan: Complex mass in left breast in the 4 oclock position for LBB    Matt B. Hassell Done, MD, [facility] [phone] beeper [phone]  03/15/2019 7:13 AM

## 2019-03-17 ENCOUNTER — Ambulatory Visit: Payer: Medicare HMO | Attending: Internal Medicine

## 2019-03-17 DIAGNOSIS — Z23 Encounter for immunization: Secondary | ICD-10-CM | POA: Insufficient documentation

## 2019-03-17 NOTE — Progress Notes (Signed)
   Covid-19 Vaccination Clinic  Name:  TINSLEIGH SLOVACEK    MRN: [id number] DOB: 1950/10/16  03/17/2019  Ms. Khurana was observed post Covid-19 immunization for 15 minutes without incidence. She was provided with Vaccine Information Sheet and instruction to access the V-Safe system.   Ms. Pagel was instructed to call 911 with any severe reactions post vaccine: Marland Kitchen Difficulty breathing  . Swelling of your face and throat  . A fast heartbeat  . A bad rash all over your body  . Dizziness and weakness    Immunizations Administered    Name Date Dose VIS Date Route   Pfizer COVID-19 Vaccine 03/17/2019 10:15 AM 0.3 mL 01/18/2019 Intramuscular   Manufacturer: [employer], [employer]   Lot: [id number]   NDC: [address]-2

## 2019-03-18 LAB — SURGICAL PATHOLOGY

## 2019-03-19 ENCOUNTER — Encounter: Payer: Self-pay | Admitting: *Deleted

## 2019-03-27 ENCOUNTER — Ambulatory Visit: Payer: Medicare HMO

## 2019-04-11 ENCOUNTER — Ambulatory Visit: Payer: Medicare HMO | Attending: Internal Medicine

## 2019-04-11 DIAGNOSIS — Z23 Encounter for immunization: Secondary | ICD-10-CM | POA: Insufficient documentation

## 2019-04-11 NOTE — Progress Notes (Signed)
   Covid-19 Vaccination Clinic  Name:  ANJULIE DIPIERRO    MRN: [id number] DOB: 26-May-1950  04/11/2019  Ms. Wilcoxson was observed post Covid-19 immunization for 15 minutes without incident. She was provided with Vaccine Information Sheet and instruction to access the V-Safe system.   Ms. Croswell was instructed to call 911 with any severe reactions post vaccine: Marland Kitchen Difficulty breathing  . Swelling of face and throat  . A fast heartbeat  . A bad rash all over body  . Dizziness and weakness   Immunizations Administered    Name Date Dose VIS Date Route   Pfizer COVID-19 Vaccine 04/11/2019  9:49 AM 0.3 mL 01/18/2019 Intramuscular   Manufacturer: [employer], [employer]   Lot: [id number]   NDC: [address]-4

## 2019-04-16 ENCOUNTER — Ambulatory Visit (INDEPENDENT_AMBULATORY_CARE_PROVIDER_SITE_OTHER): Payer: Medicare HMO | Admitting: Family Medicine

## 2019-04-16 ENCOUNTER — Other Ambulatory Visit: Payer: Self-pay

## 2019-04-16 ENCOUNTER — Encounter: Payer: Self-pay | Admitting: Family Medicine

## 2019-04-16 VITALS — BP 148/82 | HR 74 | Temp 98.0°F | Resp 17 | Ht 64.0 in | Wt 141.4 lb

## 2019-04-16 DIAGNOSIS — S66912A Strain of unspecified muscle, fascia and tendon at wrist and hand level, left hand, initial encounter: Secondary | ICD-10-CM

## 2019-04-16 HISTORY — DX: Strain of unspecified muscle, fascia and tendon at wrist and hand level, left hand, initial encounter: S66.912A

## 2019-04-16 MED ORDER — PREDNISONE 20 MG PO TABS: ORAL_TABLET | ORAL | 0 refills | Status: DC

## 2019-04-16 NOTE — Progress Notes (Signed)
This visit occurred during the SARS-CoV-2 public health emergency.  Safety protocols were in place, including screening questions prior to the visit, additional usage of staff PPE, and extensive cleaning of exam room while observing appropriate contact time as indicated for disinfecting solutions.    Angela [name] , [birth year], 69 y.o., female MRN: [id number] Patient Care Team    Relationship Specialty Notifications Start End  Natalia Leatherwood, DO PCP - General Family Medicine  04/10/17     Chief Complaint  Patient presents with  . Joint Swelling    10 days ago pt was moving furniture and injured wrist. Pt is having left wrist swelling, pain, and numbness. Having a hard time sleeping and driving      Subjective: Pt presents for an OV with complaints of left wrist pain of 10 days duration.  Associated symptoms include pain onset after moving furniture. She endorses swelling and numbness.  She reports she relies she strained her wrist immediately but did not feel as if she broke her wrist.  She has a history of carpal tunnel and the left wrist. Numbness is located second and third fingers. Pt reports the pain is making it difficult to sleep or drive.  Initially, she did ice her wrist and take Advil.  She has a carpal tunnel splint, but has not been wearing it.  Depression screen [facility] 2/9 09/12/2017 04/10/2017  Decreased Interest 0 0  Down, Depressed, Hopeless 0 0  PHQ - 2 Score 0 0    Allergies  Allergen Reactions  . Codeine Other (See Comments)    Irritable , insomnia   . Pollen Extract    Social History   Social History Narrative   Married. Retired. 2 children.   Bachelors degree.   Exercises routinely.   Drinks caffeine.   Smoke alarm in the home. Wears her seatbelt. Owns firearms.   Feels safe in her relationships.   Past Medical History:  Diagnosis Date  . Arthritis   . Blood in stool    greater than 4 years since last incidence of blood in stool   . Colon polyps   .  Glaucoma   . Hyperlipidemia   . PNA (pneumonia) 2006   Past Surgical History:  Procedure Laterality Date  . BREAST BIOPSY Left 07/19/2018   US guided breast bx with clip placement.   Marland Kitchen EYE SURGERY  1956  . HEMORRHOID SURGERY  2001  . RADIOACTIVE SEED GUIDED EXCISIONAL BREAST BIOPSY Left 03/15/2019   Procedure: RADIOACTIVE SEED GUIDED EXCISIONAL LEFT  BREAST BIOPSY;  Surgeon: Luretha Murphy, MD;  Location: [facility];  Service: General;  Laterality: Left;  . TONSILLECTOMY  1963  . WISDOM TOOTH EXTRACTION     Family History  Problem Relation Age of Onset  . Heart attack Father   . Heart attack Sister   . Hyperlipidemia Sister   . Hypertension Sister    Allergies as of 04/16/2019      Reactions   Codeine Other (See Comments)   Irritable , insomnia    Pollen Extract       Medication List       Accurate as of April 16, 2019  5:02 PM. If you have any questions, ask your nurse or doctor.        STOP taking these medications   HYDROcodone-acetaminophen 5-325 MG tablet Commonly known as: NORCO/VICODIN Stopped by: Felix Pacini, DO   PreviDent 5000 Booster Plus 1.1 % Pste Generic drug: Sodium Fluoride  Stopped by: Howard Pouch, DO     TAKE these medications   cholecalciferol 1000 units tablet Commonly known as: VITAMIN D Take 1,000 Units by mouth daily.   DAILY PROBIOTIC PO Take by mouth.   FIBER-CAPS PO Take by mouth.   latanoprost 0.005 % ophthalmic solution Commonly known as: XALATAN Place 1 drop into both eyes at bedtime.   multivitamin tablet Take 1 tablet by mouth daily.   predniSONE 20 MG tablet Commonly known as: DELTASONE 60 mg x3d, 40 mg x3d, 20 mg x2d, 10 mg x2d Started by: Howard Pouch, DO   timolol 0.5 % ophthalmic solution Commonly known as: BETIMOL Place 1 drop into both eyes daily.       All past medical history, surgical history, allergies, family history, immunizations andmedications were updated in the EMR today and  reviewed under the history and medication portions of their EMR.     ROS: Negative, with the exception of above mentioned in HPI   Objective:  BP (!) 148/82 (BP Location: Right Arm, Patient Position: Sitting, Cuff Size: Normal)   Pulse 74   Temp 98 F (36.7 C) (Temporal)   Resp 17   Ht 5\' 4"  (1.626 m)   Wt 141 lb 6 oz (64.1 kg)   SpO2 98%   BMI 24.27 kg/m  Body mass index is 24.27 kg/m. Gen: Afebrile. No acute distress. Nontoxic in appearance, well developed, well nourished.  HENT: AT. [place].  Eyes:Pupils Equal Round Reactive to light, Extraocular movements intact,  Conjunctiva without redness, discharge or icterus. MSK-left wrist: No erythema or bruising, mild soft tissue swelling present radial aspect of wrist.  Tender to palpation radial aspect of wrist and thenar eminence of hand.  Negative Finkelstein.  Negative Tinel's.  Full range of motion with discomfort on resisted supination.  Numbness/tingling sensation second and third digits.  Normal capillary refill. Neuro:  Normal gait. PERLA. EOMi. Alert. Oriented x3  No exam data present No results found. No results found for this or any previous visit (from the past 24 hour(s)).  Assessment/Plan: [name] is a 69 y.o. female present for OV for  Strain of left wrist, initial encounter Discussed wrist strain with possible hyperextension causing injury or impingement of her median nerve secondary to swelling.  We discussed the possibility of scaphoid fracture given the location of her tenderness. Prednisone taper prescribed. Patient to wear her wrist splint 24-7 except for when showering. Follow-up in 2 weeks, if still tender at that time will obtain wrist x-ray.  Patient reports understanding.    Reviewed expectations re: course of current medical issues.  Discussed self-management of symptoms.  Outlined signs and symptoms indicating need for more acute intervention.  Patient verbalized understanding and all questions  were answered.  Patient received an After-Visit Summary.    No orders of the defined types were placed in this encounter.  Meds ordered this encounter  Medications  . predniSONE (DELTASONE) 20 MG tablet    Sig: 60 mg x3d, 40 mg x3d, 20 mg x2d, 10 mg x2d    Dispense:  18 tablet    Refill:  0   Referral Orders  No referral(s) requested today     Note is dictated utilizing voice recognition software. Although note has been proof read prior to signing, occasional typographical errors still can be missed. If any questions arise, please do not hesitate to call for verification.   electronically signed by:  Howard Pouch, DO  [place]

## 2019-04-16 NOTE — Patient Instructions (Signed)
It sounds like you strained your wrist and hyperextended your wrist causing the nerve to "stretched".   Start prednisone taper. Wear your splint/brace except for when in shower.  Follow up in 2 weeks.    Scaphoid Fracture  A scaphoid fracture is a break in one of the small bones of the wrist. The scaphoid bone is located on the thumb side of the wrist. It supports the other seven bones that make up the wrist. The scaphoid bone has a poor blood supply, so it can take a long time to heal. You may need to wear a cast or splint for several months. What are the causes? This injury is usually caused by a fall onto an outstretched hand and arm. This type of injury may also occur if you are in a motor vehicle accident and you brace yourself with your hand. What increases the risk? You are more likely to develop this condition if you:  Play contact sports.  Participate in activities such as skiing, skating, or rollerblading. What are the signs or symptoms? Symptoms of this condition include:  Pain, especially when grasping or pinching with your thumb.  Pain when pressing on the base of your thumb, especially in the hollow area at the base of your thumb when your thumb is extended outward.  Swelling.  Bruising. How is this diagnosed? This condition may be diagnosed based on:  Your history of injury.  A physical exam of your wrist and thumb.  X-rays.  A CT scan or an MRI. A scaphoid fracture may be hard to diagnose. This is because pain may not start for a few days, the fracture does not cause a deformity, and the fracture may not limit movement. How is this treated? Treatment depends on the location of the fracture and whether the bone is out of place (displaced). Treatment may be surgical or nonsurgical. It may include:  Wearing a cast or splint from the middle of your forearm down to your wrist. Your thumb may be extended out and included in the cast or splint.  Receiving  treatment with sound waves or electrical energy that stimulates healing (bone stimulator).  Having surgery. A displaced fracture may require surgery to put the pieces of bone back in the proper position. Screws or wires may be used to hold the bone in place and a cast or splint might be applied. You may need to do exercises to help you regain wrist movement (physical therapy) after your cast or splint is removed. Follow these instructions at home: If you have a cast:  Do not put pressure on any part of the cast until it is fully hardened. This may take several hours.  Do not stick anything inside the cast to scratch your skin. Doing that increases your risk of infection.  You may put lotion on dry skin around the edges of the cast. Do not put lotion on the skin underneath the cast. If you have a splint:  Wear the splint as told by your health care provider. Remove it only as told by your health care provider.  Loosen the splint if your fingers tingle, become numb, or turn cold and blue. If you have a cast or a splint:  If the splint or cast is not waterproof: ? Do not let it get wet. ? Cover it with a watertight covering when you take a bath or shower.  Check the skin around it every day. Tell your health care provider about any concerns.  Keep it clean and dry. Managing pain, stiffness, and swelling   If directed, put ice on the injured area. ? Put ice in a plastic bag. ? Place a towel between your skin and the bag. ? Leave the ice on for 20 minutes, 2-3 times a day.  Move your fingers often to reduce stiffness and swelling.  Raise (elevate) the injured area above the level of your heart while you are sitting or lying down. Medicines  Ask your health care provider if the medicine prescribed to you: ? Requires you to avoid driving or using heavy machinery. ? Can cause constipation. You may need to take these actions to prevent or treat constipation, such as:  Drink enough  fluid to keep your urine pale yellow.  Take over-the-counter or prescription medicines.  Eat foods that are high in fiber, such as beans, whole grains, and fresh fruits and vegetables.  Limit foods that are high in fat and processed sugars, such as fried or sweet foods. Activity  Ask your health care provider when it is safe to drive if you have a cast or splint on your hand.  Return to your normal activities as told by your health care provider. Ask your health care provider what activities are safe for you. You may need to limit activities such as contact sports, throwing, pushing, and climbing.  Do not lift anything that is heavier than 1 lb (0.5 kg), or the limit that you are told, with the affected hand until your health care provider says that it is safe.  Do physical therapy as told by your health care provider. General instructions  Take over-the-counter and prescription medicines only as told by your health care provider.  Do not use any products that contain nicotine or tobacco, such as cigarettes, e-cigarettes, and chewing tobacco. These can delay bone healing. If you need help quitting, ask your health care provider.  Keep all follow-up visits as told by your health care provider. This is important. Contact a health care provider if:  Your pain or swelling gets worse.  You have pain, numbness, or coldness in your hand or fingers.  Your cast or splint becomes loose or damaged. Get help right away if:  You lose feeling in your hand or fingers.  Your fingers or fingernails turn pale or blue. Summary  A scaphoid fracture is a break in one of the small bones of the wrist.  This injury is usually caused by a fall onto an outstretched hand and arm.  Symptoms of this injury are pain and swelling of the hand.  Treatment depends on the location of the fracture and whether the bone is out of place (displaced). Treatment may be surgical or nonsurgical.  You may need a  cast or splint from the middle of your forearm down to your wrist. You may need to wear this for several months. This information is not intended to replace advice given to you by your health care provider. Make sure you discuss any questions you have with your health care provider. Document Revised: 02/02/2018 Document Reviewed: 02/02/2018 Elsevier Patient Education  2020 [employer].

## 2019-05-17 ENCOUNTER — Other Ambulatory Visit: Payer: Self-pay

## 2019-05-20 ENCOUNTER — Encounter: Payer: Self-pay | Admitting: Family Medicine

## 2019-05-20 ENCOUNTER — Other Ambulatory Visit: Payer: Self-pay

## 2019-05-20 ENCOUNTER — Ambulatory Visit (INDEPENDENT_AMBULATORY_CARE_PROVIDER_SITE_OTHER): Payer: Medicare HMO | Admitting: Family Medicine

## 2019-05-20 VITALS — BP 136/86 | HR 82 | Temp 98.1°F | Resp 18 | Ht 64.0 in | Wt 143.2 lb

## 2019-05-20 DIAGNOSIS — S66912A Strain of unspecified muscle, fascia and tendon at wrist and hand level, left hand, initial encounter: Secondary | ICD-10-CM

## 2019-05-20 DIAGNOSIS — M25532 Pain in left wrist: Secondary | ICD-10-CM

## 2019-05-20 DIAGNOSIS — Z1211 Encounter for screening for malignant neoplasm of colon: Secondary | ICD-10-CM | POA: Diagnosis not present

## 2019-05-20 DIAGNOSIS — K635 Polyp of colon: Secondary | ICD-10-CM | POA: Diagnosis not present

## 2019-05-20 NOTE — Patient Instructions (Signed)
I have referred you to your gastroenterologist for colonoscopy.  I ordered your left wrist xray> as soon as I get the results I will call you with further plan.    Wear your wrist splint until we have more answers.

## 2019-05-20 NOTE — Progress Notes (Signed)
This visit occurred during the SARS-CoV-2 public health emergency.  Safety protocols were in place, including screening questions prior to the visit, additional usage of staff PPE, and extensive cleaning of exam room while observing appropriate contact time as indicated for disinfecting solutions.    Angela [name] , Feb 15, 1950, 69 y.o., female MRN: [id number] Patient Care Team    Relationship Specialty Notifications Start End  Ma Hillock, DO PCP - General Family Medicine  04/10/17     Chief Complaint  Patient presents with  . Follow-up    F/U on left arm. Doing better but did not wear brace this weekend and it started hurting again.      Subjective: Pt presents for an OV with complaints of left wrist pain since end of February. She states she has been wearing her wrist brace and has seen a good deal of improvement. She still endorses a tingling sensation at times in her 3rd finger and 4 th fingers today. She states she can live with it though and has had similar symptoms in the past not related to current issue. She states her wrist pain was improved until she picked up a  Bag of dog food the other day. Her pain is along the radial aspect of her wrist.  Prior note:  10 days duration.  Associated symptoms include pain onset after moving furniture. She endorses swelling and numbness.  She reports she relies she strained her wrist immediately but did not feel as if she broke her wrist.  She has a history of carpal tunnel and the left wrist. Numbness is located second and third fingers. Pt reports the pain is making it difficult to sleep or drive.  Initially, she did ice her wrist and take Advil.  She has a carpal tunnel splint, but has not been wearing it.   Health maintenance: Pt also states it is time for her colonoscopy- she is uncertain if she needs a referral back to [facility].   Depression screen [facility] 2/9 09/12/2017 04/10/2017  Decreased Interest 0 0  Down, Depressed, Hopeless 0 0    PHQ - 2 Score 0 0    Allergies  Allergen Reactions  . Codeine Other (See Comments)    Irritable , insomnia   . Pollen Extract    Social History   Social History Narrative   Married. Retired. 2 children.   Bachelors degree.   Exercises routinely.   Drinks caffeine.   Smoke alarm in the home. Wears her seatbelt. Owns firearms.   Feels safe in her relationships.   Past Medical History:  Diagnosis Date  . Arthritis   . Blood in stool    greater than 4 years since last incidence of blood in stool   . Colon polyps   . Glaucoma   . Hyperlipidemia   . PNA (pneumonia) 2006   Past Surgical History:  Procedure Laterality Date  . BREAST BIOPSY Left 07/19/2018   US guided breast bx with clip placement.   Marland Kitchen EYE SURGERY  1956  . [place]  2001  . RADIOACTIVE SEED GUIDED EXCISIONAL BREAST BIOPSY Left 03/15/2019   Procedure: RADIOACTIVE SEED GUIDED EXCISIONAL LEFT  BREAST BIOPSY;  Surgeon: Johnathan Hausen, MD;  Location: [facility];  Service: General;  Laterality: Left;  . TONSILLECTOMY  1963  . WISDOM TOOTH EXTRACTION     Family History  Problem Relation Age of Onset  . Heart attack Father   . Heart attack Sister   .  Hyperlipidemia Sister   . Hypertension Sister    Allergies as of 05/20/2019      Reactions   Codeine Other (See Comments)   Irritable , insomnia    Pollen Extract       Medication List       Accurate as of May 20, 2019  1:21 PM. If you have any questions, ask your nurse or doctor.        STOP taking these medications   predniSONE 20 MG tablet Commonly known as: DELTASONE Stopped by: Felix Pacini, DO     TAKE these medications   cholecalciferol 1000 units tablet Commonly known as: VITAMIN D Take 1,000 Units by mouth daily.   DAILY PROBIOTIC PO Take by mouth.   FIBER-CAPS PO Take by mouth.   latanoprost 0.005 % ophthalmic solution Commonly known as: XALATAN Place 1 drop into both eyes at bedtime.   multivitamin  tablet Take 1 tablet by mouth daily.   timolol 0.5 % ophthalmic solution Commonly known as: BETIMOL Place 1 drop into both eyes daily.       All past medical history, surgical history, allergies, family history, immunizations andmedications were updated in the EMR today and reviewed under the history and medication portions of their EMR.     ROS: Negative, with the exception of above mentioned in HPI   Objective:  BP 136/86 (BP Location: Left Arm, Patient Position: Sitting, Cuff Size: Normal)   Pulse 82   Temp 98.1 F (36.7 C) (Temporal)   Resp 18   Ht 5\' 4"  (1.626 m)   Wt 143 lb 4 oz (65 kg)   SpO2 97%   BMI 24.59 kg/m  Body mass index is 24.59 kg/m. Gen: Afebrile. No acute distress.  HENT: AT. [place]. MSK (left wrist): no erythema or swelling. Not TTP today. Mild discomfort with resisted  supination. Neg finklestein, neg tinels. NV intact distally.  Skin: no rashes, purpura or petechiae.  Neuro:  Normal gait. PERLA. EOMi. Alert. Oriented x3    No exam data present No results found. No results found for this or any previous visit (from the past 24 hour(s)).  Assessment/Plan: Angela [name] is a 69 y.o. female present for OV for  Strain of left wrist, initial encounter > 6 weeks since injury with continued symptoms. Had improved, but recently pain increased after lifting a bag of dog food. Discussed wrist strain with possible hyperextension causing injury or impingement of her median nerve secondary to swelling.  We discussed the possibility of scaphoid fracture given the location of her tenderness. Patient to wear her wrist splint 24-7 except for when showering. Left wrist xray ordered today. Further plan dependent up on results > either SM or ortho referral.   Polyp of colon, unspecified part of colon, unspecified type/Colon cancer screening Pt reports she is due for her 5 yr follow up. Had seen 73- Dr. Enid Baas in the past.  - Ambulatory referral to  Gastroenterology   Reviewed expectations re: course of current medical issues.  Discussed self-management of symptoms.  Outlined signs and symptoms indicating need for more acute intervention.  Patient verbalized understanding and all questions were answered.  Patient received an After-Visit Summary.    Orders Placed This Encounter  Procedures  . DG Wrist Complete Left  . Ambulatory referral to Gastroenterology   No orders of the defined types were placed in this encounter.   Referral Orders     Ambulatory referral to Gastroenterology   Note is dictated  utilizing voice recognition software. Although note has been proof read prior to signing, occasional typographical errors still can be missed. If any questions arise, please do not hesitate to call for verification.   electronically signed by:  Howard Pouch, DO  [place]

## 2019-05-21 ENCOUNTER — Ambulatory Visit (HOSPITAL_BASED_OUTPATIENT_CLINIC_OR_DEPARTMENT_OTHER)
Admission: RE | Admit: 2019-05-21 | Discharge: 2019-05-21 | Disposition: A | Discharge status: Home / Self Care | Payer: Medicare HMO | Source: Ambulatory Visit | Attending: Family Medicine | Admitting: Family Medicine

## 2019-05-21 DIAGNOSIS — M25532 Pain in left wrist: Secondary | ICD-10-CM

## 2019-05-22 ENCOUNTER — Telehealth: Payer: Self-pay | Admitting: Family Medicine

## 2019-05-22 DIAGNOSIS — S66912D Strain of unspecified muscle, fascia and tendon at wrist and hand level, left hand, subsequent encounter: Secondary | ICD-10-CM

## 2019-05-22 NOTE — Telephone Encounter (Signed)
Pt was called and given information/results, she agreed with plan.

## 2019-05-22 NOTE — Telephone Encounter (Signed)
Please inform patient the following information: Her x-ray did not show evidence of a fracture, dislocation or cause of her continued discomfort. I do have concerns she may have injured her wrist more than just a strain , since initial injury was over 6 weeks ago and although we did make some improvement with the wrist splint, without the splint recurrence injury/pain reoccurred.  X-rays can only give [place] part of the picture.  If she had a torn or partially torn ligament/tendon we would not be able to see that on x-ray and it could explain why she had a quick recurrence of her pain.  Therefore I do recommend an orthopedic referral and I have placed that for her today.  In the meantime, continue to wear wrist brace and she can continue to take anti-inflammatories as needed for discomfort.  They will call to get her scheduled.

## 2019-05-30 ENCOUNTER — Encounter: Payer: Self-pay | Admitting: Orthopaedic Surgery

## 2019-05-30 ENCOUNTER — Ambulatory Visit: Payer: Medicare HMO | Admitting: Orthopaedic Surgery

## 2019-05-30 ENCOUNTER — Other Ambulatory Visit: Payer: Self-pay

## 2019-05-30 VITALS — Ht 64.0 in | Wt 140.0 lb

## 2019-05-30 DIAGNOSIS — S63502A Unspecified sprain of left wrist, initial encounter: Secondary | ICD-10-CM | POA: Diagnosis not present

## 2019-05-30 MED ORDER — DICLOFENAC SODIUM 1 % EX GEL: 2.0000 g | Freq: Four times a day (QID) | CUTANEOUS | 0 refills | Status: DC

## 2019-05-30 MED ORDER — MELOXICAM 7.5 MG PO TABS: 7.5000 mg | ORAL_TABLET | Freq: Every day | ORAL | 2 refills | Status: AC

## 2019-05-30 NOTE — Progress Notes (Signed)
Office Visit Note   Patient: Angela [name]           Date of Birth: 06-28-1950           MRN: [id number] Visit Date: 05/30/2019              Requested by: Ma Hillock, DO [address] [place],  [place] [id number] PCP: Ma Hillock, DO   Assessment & Plan: Visit Diagnoses:  1. Wrist sprain, left, initial encounter     Plan: Impression is left wrist sprain.  Will place patient in a removable Velcro thumb spica splint for the next few weeks.  Have also called in oral and topical anti-inflammatories to take as needed.  She will follow-up with [place] in 3 weeks time for repeat evaluation.  Call with concerns or questions in meantime.  Follow-Up Instructions: Return in about 3 weeks (around 06/20/2019).   Orders:  No orders of the defined types were placed in this encounter.  Meds ordered this encounter  Medications  . meloxicam (MOBIC) 7.5 MG tablet    Sig: Take 1 tablet (7.5 mg total) by mouth daily for 14 doses.    Dispense:  30 tablet    Refill:  2  . diclofenac Sodium (VOLTAREN) 1 % GEL    Sig: Apply 2 g topically 4 (four) times daily.    Dispense:  150 g    Refill:  0      Procedures: No procedures performed   Clinical Data: No additional findings.   Subjective: Chief Complaint  Patient presents with  . Left Wrist - Pain    HPI patient is a pleasant 69 year old right-hand-dominant female who presents to our clinic today with an injury to her left wrist.  Approximately 2 months ago, she was moving a Ecologist when she noticed pain to the distal radius and into the thumb approximately 1 to 2 hours later.  She does not recall a specific injury while moving the dresser.  The pain is worse with certain motions of the wrist to include pronation.  She initially had numbness to the entire hand and all 5 fingers which did significantly improve with a course of steroids prescribed by her PCP.  She now notes a slight bit of numbness to the index, long and ring fingertips.  She  notes a very remote carpal tunnel release approved.  Of note, she was seen at [facility] where x-rays were obtained.  These were negative for acute findings.  Review of Systems as detailed in HPI.  All others reviewed and are negative.   Objective: Vital Signs: Ht 5\' 4"  (1.626 m)   Wt 140 lb (63.5 kg)   BMI 24.03 kg/m   Physical Exam well-nourished female in no acute distress.  Alert and oriented x3.  Ortho Exam examination of the left wrist reveals very minimal tenderness to the first dorsal compartment.  Negative Finkelstein.  Mild tenderness to the first [facility] joint.  Negative grind test.  Increased pain with pronation.  Decreased sensation to the tip of the long and ring fingers.  Specialty Comments:  No specialty comments available.  Imaging: No new imaging   PMFS History: Patient Active Problem List   Diagnosis Date Noted  . Strain of left wrist 04/16/2019  . Abnormal mammogram 05/15/2017  . Overweight (BMI 25.0-29.9) 04/10/2017   Past Medical History:  Diagnosis Date  . Arthritis   . Blood in stool    greater than 4 years since  last incidence of blood in stool   . Colon polyps   . Glaucoma   . Hyperlipidemia   . PNA (pneumonia) 2006    Family History  Problem Relation Age of Onset  . Heart attack Father   . Heart attack Sister   . Hyperlipidemia Sister   . Hypertension Sister     Past Surgical History:  Procedure Laterality Date  . BREAST BIOPSY Left 07/19/2018   US guided breast bx with clip placement.   Marland Kitchen EYE SURGERY  1956  . HEMORRHOID SURGERY  2001  . RADIOACTIVE SEED GUIDED EXCISIONAL BREAST BIOPSY Left 03/15/2019   Procedure: RADIOACTIVE SEED GUIDED EXCISIONAL LEFT  BREAST BIOPSY;  Surgeon: Luretha Murphy, MD;  Location: [facility];  Service: General;  Laterality: Left;  . TONSILLECTOMY  1963  . WISDOM TOOTH EXTRACTION     Social History   Occupational History  . Not on file  Tobacco Use  . Smoking status: Current Some  Day Smoker    Packs/day: 1.00    Years: 15.00    Pack years: 15.00    Types: Cigars  . Smokeless tobacco: Never Used  . Tobacco comment: *occasional smoker   Substance and Sexual Activity  . Alcohol use: Yes    Alcohol/week: 4.0 standard drinks    Types: 4 Glasses of wine per week    Comment: *rarely   . Drug use: No  . Sexual activity: Yes    Partners: Male    Comment: Married

## 2019-06-19 ENCOUNTER — Other Ambulatory Visit: Payer: Self-pay

## 2019-06-19 ENCOUNTER — Ambulatory Visit (INDEPENDENT_AMBULATORY_CARE_PROVIDER_SITE_OTHER): Payer: Medicare HMO | Admitting: Family Medicine

## 2019-06-19 ENCOUNTER — Encounter: Payer: Self-pay | Admitting: Family Medicine

## 2019-06-19 VITALS — BP 144/92 | HR 73 | Temp 98.0°F | Resp 17 | Ht 63.25 in | Wt 142.2 lb

## 2019-06-19 DIAGNOSIS — I10 Essential (primary) hypertension: Secondary | ICD-10-CM | POA: Insufficient documentation

## 2019-06-19 DIAGNOSIS — Z23 Encounter for immunization: Secondary | ICD-10-CM | POA: Diagnosis not present

## 2019-06-19 DIAGNOSIS — Z131 Encounter for screening for diabetes mellitus: Secondary | ICD-10-CM

## 2019-06-19 DIAGNOSIS — Z0000 Encounter for general adult medical examination without abnormal findings: Secondary | ICD-10-CM | POA: Diagnosis not present

## 2019-06-19 DIAGNOSIS — S66912D Strain of unspecified muscle, fascia and tendon at wrist and hand level, left hand, subsequent encounter: Secondary | ICD-10-CM

## 2019-06-19 LAB — CBC
HCT: 39.7 % (ref 36.0–46.0)
Hemoglobin: 13.5 g/dL (ref 12.0–15.0)
MCHC: 34.1 g/dL (ref 30.0–36.0)
MCV: 89.5 fl (ref 78.0–100.0)
Platelets: 401 10*3/uL — ABNORMAL HIGH (ref 150.0–400.0)
RBC: 4.43 Mil/uL (ref 3.87–5.11)
RDW: 14.4 % (ref 11.5–15.5)
WBC: 13.9 10*3/uL — ABNORMAL HIGH (ref 4.0–10.5)

## 2019-06-19 LAB — COMPREHENSIVE METABOLIC PANEL
ALT: 13 U/L (ref 0–35)
AST: 14 U/L (ref 0–37)
Albumin: 4 g/dL (ref 3.5–5.2)
Alkaline Phosphatase: 80 U/L (ref 39–117)
BUN: 9 mg/dL (ref 6–23)
CO2: 29 mEq/L (ref 19–32)
Calcium: 9 mg/dL (ref 8.4–10.5)
Chloride: 103 mEq/L (ref 96–112)
Creatinine, Ser: 0.56 mg/dL (ref 0.40–1.20)
GFR: 108.12 mL/min (ref >60.00)
Glucose, Bld: 91 mg/dL (ref 70–99)
Potassium: 4.2 mEq/L (ref 3.5–5.1)
Sodium: 141 mEq/L (ref 135–145)
Total Bilirubin: 0.4 mg/dL (ref 0.2–1.2)
Total Protein: 6.2 g/dL (ref 6.0–8.3)

## 2019-06-19 LAB — LIPID PANEL
Cholesterol: 216 mg/dL — ABNORMAL HIGH (ref 0–200)
HDL: 61.8 mg/dL (ref >39.00)
LDL Cholesterol: 134 mg/dL — ABNORMAL HIGH (ref 0–99)
NonHDL: 154.29
Total CHOL/HDL Ratio: 3
Triglycerides: 103 mg/dL (ref 0.0–149.0)
VLDL: 20.6 mg/dL (ref 0.0–40.0)

## 2019-06-19 LAB — HEMOGLOBIN A1C: Hgb A1c MFr Bld: 6 % (ref 4.6–6.5)

## 2019-06-19 LAB — TSH: TSH: 0.49 u[IU]/mL (ref 0.35–4.50)

## 2019-06-19 MED ORDER — LISINOPRIL 5 MG PO TABS: 5.0000 mg | ORAL_TABLET | Freq: Every day | ORAL | 1 refills | Status: DC

## 2019-06-19 MED ORDER — MELOXICAM 7.5 MG PO TABS: 7.5000 mg | ORAL_TABLET | Freq: Every day | ORAL | 1 refills | Status: DC

## 2019-06-19 NOTE — Patient Instructions (Addendum)
BP goal less than 130/80 is goal.   Health Maintenance After Age 67 After age 23, you are at a higher risk for certain long-term diseases and infections as well as injuries from falls. Falls are a major cause of broken bones and head injuries in people who are older than age 58. Getting regular preventive care can help to keep you healthy and well. Preventive care includes getting regular testing and making lifestyle changes as recommended by your health care provider. Talk with your health care provider about:  Which screenings and tests you should have. A screening is a test that checks for a disease when you have no symptoms.  A diet and exercise plan that is right for you. What should I know about screenings and tests to prevent falls? Screening and testing are the best ways to find a health problem early. Early diagnosis and treatment give you the best chance of managing medical conditions that are common after age 64. Certain conditions and lifestyle choices may make you more likely to have a fall. Your health care provider may recommend:  Regular vision checks. Poor vision and conditions such as cataracts can make you more likely to have a fall. If you wear glasses, make sure to get your prescription updated if your vision changes.  Medicine review. Work with your health care provider to regularly review all of the medicines you are taking, including over-the-counter medicines. Ask your health care provider about any side effects that may make you more likely to have a fall. Tell your health care provider if any medicines that you take make you feel dizzy or sleepy.  Osteoporosis screening. Osteoporosis is a condition that causes the bones to get weaker. This can make the bones weak and cause them to break more easily.  Blood pressure screening. Blood pressure changes and medicines to control blood pressure can make you feel dizzy.  Strength and balance checks. Your health care provider may  recommend certain tests to check your strength and balance while standing, walking, or changing positions.  Foot health exam. Foot pain and numbness, as well as not wearing proper footwear, can make you more likely to have a fall.  Depression screening. You may be more likely to have a fall if you have a fear of falling, feel emotionally low, or feel unable to do activities that you used to do.  Alcohol use screening. Using too much alcohol can affect your balance and may make you more likely to have a fall. What actions can I take to lower my risk of falls? General instructions  Talk with your health care provider about your risks for falling. Tell your health care provider if: ? You fall. Be sure to tell your health care provider about all falls, even ones that seem minor. ? You feel dizzy, sleepy, or off-balance.  Take over-the-counter and prescription medicines only as told by your health care provider. These include any supplements.  Eat a healthy diet and maintain a healthy weight. A healthy diet includes low-fat dairy products, low-fat (lean) meats, and fiber from whole grains, beans, and lots of fruits and vegetables. Home safety  Remove any tripping hazards, such as rugs, cords, and clutter.  Install safety equipment such as grab bars in bathrooms and safety rails on stairs.  Keep rooms and walkways well-lit. Activity   Follow a regular exercise program to stay fit. This will help you maintain your balance. Ask your health care provider what types of exercise are appropriate  for you.  If you need a cane or walker, use it as recommended by your health care provider.  Wear supportive shoes that have nonskid soles. Lifestyle  Do not drink alcohol if your health care provider tells you not to drink.  If you drink alcohol, limit how much you have: ? 0-1 drink a day for women. ? 0-2 drinks a day for men.  Be aware of how much alcohol is in your drink. In the [place] one drink  equals one typical bottle of beer (12 oz), one-half glass of wine (5 oz), or one shot of hard liquor (1 oz).  Do not use any products that contain nicotine or tobacco, such as cigarettes and e-cigarettes. If you need help quitting, ask your health care provider. Summary  Having a healthy lifestyle and getting preventive care can help to protect your health and wellness after age 45.  Screening and testing are the best way to find a health problem early and help you avoid having a fall. Early diagnosis and treatment give you the best chance for managing medical conditions that are more common for people who are older than age 49.  Falls are a major cause of broken bones and head injuries in people who are older than age 21. Take precautions to prevent a fall at home.  Work with your health care provider to learn what changes you can make to improve your health and wellness and to prevent falls. This information is not intended to replace advice given to you by your health care provider. Make sure you discuss any questions you have with your health care provider. Document Revised: 05/17/2018 Document Reviewed: 12/07/2016 Elsevier Patient Education  2020 [employer].

## 2019-06-19 NOTE — Progress Notes (Signed)
This visit occurred during the SARS-CoV-2 public health emergency.  Safety protocols were in place, including screening questions prior to the visit, additional usage of staff PPE, and extensive cleaning of exam room while observing appropriate contact time as indicated for disinfecting solutions.    Patient ID: Angela [name], female  DOB: Feb 29, 1950, 69 y.o.   MRN: [id number] Patient Care Team    Relationship Specialty Notifications Start End  Natalia Leatherwood, DO PCP - General Family Medicine  04/10/17   Willis Modena, MD Consulting Physician Gastroenterology  06/19/19     Chief Complaint  Patient presents with  . Annual Exam    Not fasting. Pap smear 2019. Mammogram 01/2019. Colonoscopy scheduled for 07/09/2019 with Dr Dulce Sellar.     Subjective: Angela [name] is a 69 y.o.  Female  present for CPE. All past medical history, surgical history, allergies, family history, immunizations, medications and social history were updated in the electronic medical record today. All recent labs, ED visits and hospitalizations within the last year were reviewed.   Hypertension:  Patient has had elevated/borderline blood pressures over the last few visits.  We have waited to consider medication secondary to her level of pain during the visits from an injury.  She has been monitoring at home and states that it is consistently in the 140s/90s at home as well.  Patient denies chest pain, shortness of breath or lower extremity edema. Pt does not take a. daily baby ASA . Pt is not prescribed statin. RF: Hypertension, former smoker, family history heart disease  Left wrist pain: Patient has now been seen by orthopedics.  X-ray was without fracture.  They placed her in a splint for a few additional weeks.  She states it is much improved.  She is on her last few days of Mobic 7.5 mg daily.  She feels this helps with her wrist pain and arthritic discomforts and would like to continue if possible.  Health  maintenance:  Colonoscopy: scheduled June. Dr. Dulce Sellar.  Mammogram:  No fhx. 01/2019>brbx 03/15/2019- benign. Make sure she has 6 mos f/u.  Cervical cancer screening: n/A > 65 Immunizations: tdap UTD2019, Influenza UTD 2020 (encouraged yearly), Prevnar today- PNA23 2022.  Shingrix series completed. covid completed Infectious disease screening: HIV and  Hep C completed DEXA: 05/2017-normal Assistive device: none Oxygen WUJ:WJXB Patient has a Dental home. Hospitalizations/ED visits: reviewed  Depression screen [facility] 2/9 06/19/2019 09/12/2017 04/10/2017  Decreased Interest 0 0 0  Down, Depressed, Hopeless 0 0 0  PHQ - 2 Score 0 0 0   No flowsheet data found.   Immunization History  Administered Date(s) Administered  . Influenza, High Dose Seasonal PF 10/14/2016, 11/30/2017, 11/02/2018  . Influenza-Unspecified 01/08/2001, 11/13/2001, 11/20/2012  . PFIZER SARS-COV-2 Vaccination 03/17/2019, 04/11/2019  . Pneumococcal Conjugate-13 06/19/2019  . Td 02/07/1998  . Tdap 04/10/2017  . Zoster Recombinat (Shingrix) 04/10/2017, 09/12/2017    Past Medical History:  Diagnosis Date  . Arthritis   . Blood in stool    greater than 4 years since last incidence of blood in stool   . Colon polyps   . Glaucoma   . Hyperlipidemia   . PNA (pneumonia) 2006   Allergies  Allergen Reactions  . Codeine Other (See Comments)    Irritable , insomnia   . Pollen Extract    Past Surgical History:  Procedure Laterality Date  . BREAST BIOPSY Left 07/19/2018   US guided breast bx with clip placement.   Marland Kitchen EYE SURGERY  1956  .  [place]  2001  . RADIOACTIVE SEED GUIDED EXCISIONAL BREAST BIOPSY Left 03/15/2019   Procedure: RADIOACTIVE SEED GUIDED EXCISIONAL LEFT  BREAST BIOPSY;  Surgeon: Johnathan Hausen, MD;  Location: [facility];  Service: General;  Laterality: Left;  . TONSILLECTOMY  1963  . WISDOM TOOTH EXTRACTION     Family History  Problem Relation Age of Onset  . Heart attack Father    . Heart attack Sister   . Hyperlipidemia Sister   . Hypertension Sister    Social History   Social History Narrative   Married. Retired. 2 children.   Bachelors degree.   Exercises routinely.   Drinks caffeine.   Smoke alarm in the home. Wears her seatbelt. Owns firearms.   Feels safe in her relationships.    Allergies as of 06/19/2019      Reactions   Codeine Other (See Comments)   Irritable , insomnia    Pollen Extract       Medication List       Accurate as of Jun 19, 2019 11:54 AM. If you have any questions, ask your nurse or doctor.        STOP taking these medications   diclofenac Sodium 1 % Gel Commonly known as: Voltaren Stopped by: Howard Pouch, DO     TAKE these medications   cholecalciferol 1000 units tablet Commonly known as: VITAMIN D Take 1,000 Units by mouth daily.   DAILY PROBIOTIC PO Take by mouth.   FIBER-CAPS PO Take by mouth.   latanoprost 0.005 % ophthalmic solution Commonly known as: XALATAN Place 1 drop into both eyes at bedtime.   Linzess 72 MCG capsule Generic drug: linaclotide Take 72 mcg by mouth every morning.   lisinopril 5 MG tablet Commonly known as: ZESTRIL Take 1 tablet (5 mg total) by mouth daily. Started by: Howard Pouch, DO   meloxicam 7.5 MG tablet Commonly known as: MOBIC Take 1 tablet (7.5 mg total) by mouth daily.   multivitamin tablet Take 1 tablet by mouth daily.   timolol 0.5 % ophthalmic solution Commonly known as: BETIMOL Place 1 drop into both eyes daily.       All past medical history, surgical history, allergies, family history, immunizations andmedications were updated in the EMR today and reviewed under the history and medication portions of their EMR.     No results found for this or any previous visit (from the past 2160 hour(s)).  ROS: 14 pt review of systems performed and negative (unless mentioned in an HPI)  Objective: BP (!) 144/92 (BP Location: Right Arm, Patient Position:  Sitting, Cuff Size: Normal)   Pulse 73   Temp 98 F (36.7 C) (Temporal)   Resp 17   Ht 5' 3.25" (1.607 m)   Wt 142 lb 4 oz (64.5 kg)   SpO2 99%   BMI 25.00 kg/m  Gen: Afebrile. No acute distress. Nontoxic in appearance, well-developed, well-nourished,  Nontoxic, pleasant female.  HENT: AT. [place]. Bilateral TM visualized and normal in appearance, normal external auditory canal. MMM, no oral lesions, adequate dentition. Bilateral nares within normal limits. Throat without erythema, ulcerations or exudates. no Cough on exam, no hoarseness on exam. Eyes:Pupils Equal Round Reactive to light, Extraocular movements intact,  Conjunctiva without redness, discharge or icterus. Neck/lymp/endocrine: Supple,no lymphadenopathy, no thyromegaly CV: RRR no murmur, no edema, +2/4 P posterior tibialis pulses.  Chest: CTAB, no wheeze, rhonchi or crackles. normal Respiratory effort. good Air movement. Abd: Soft. flat. NTND. BS present. no Masses palpated.  No hepatosplenomegaly. No rebound tenderness or guarding. Skin: no rashes, purpura or petechiae. Warm and well-perfused. Skin intact. Neuro/Msk:  Normal gait. PERLA. EOMi. Alert. Oriented x3.  Cranial nerves II through XII intact. Muscle strength 5/5 upper/lower extremity. DTRs equal bilaterally. Psych: Normal affect, dress and demeanor. Normal speech. Normal thought content and judgment.   No exam data present  Assessment/plan: RENIA MIKELSON is a 69 y.o. female present for CPE Essential hypertension Patient is agreeable to start of lisinopril 5 mg daily.  She will monitor her blood pressures at home with goal of less than 130/85. -Routine exercise encourage -Low-sodium heart healthy diet encouraged - CBC - Comprehensive metabolic panel - Lipid panel - TSH Follow-up 5.5 mos, sooner if blood pressures above goal. Diabetes mellitus screening - Hemoglobin A1c Strain of left wrist, subsequent encounter much improved. She would like to continue mobic. It  has helped.  Continue mobic 7.5 mg.  Caution with bleeding discussed.  Encounter for preventive health examination Patient was encouraged to exercise greater than 150 minutes a week. Patient was encouraged to choose a diet filled with fresh fruits and vegetables, and lean meats. AVS provided to patient today for education/recommendation on gender specific health and safety maintenance. Colonoscopy: scheduled June. Dr. Dulce Sellar.  Mammogram:  No fhx. 01/2019>brbx 03/15/2019- benign. Make sure she has 6 mos f/u. She will call us to place order if her surgical team has not.  Cervical cancer screening: n/A > 65 Immunizations:UTD except>  Prevnar today Infectious disease screening: UTD DEXA: 05/2017-normal  Return in about 6 months (around 12/09/2019) for [facility] (30 min).  Orders Placed This Encounter  Procedures  . Pneumococcal conjugate vaccine 13-valent IM  . CBC  . Comprehensive metabolic panel  . Hemoglobin A1c  . Lipid panel  . TSH   Meds ordered this encounter  Medications  . lisinopril (ZESTRIL) 5 MG tablet    Sig: Take 1 tablet (5 mg total) by mouth daily.    Dispense:  90 tablet    Refill:  1  . meloxicam (MOBIC) 7.5 MG tablet    Sig: Take 1 tablet (7.5 mg total) by mouth daily.    Dispense:  90 tablet    Refill:  1   Referral Orders  No referral(s) requested today     Electronically signed by: Felix Pacini, DO [facility] [place]

## 2019-06-20 ENCOUNTER — Telehealth: Payer: Self-pay | Admitting: Family Medicine

## 2019-06-20 NOTE — Telephone Encounter (Signed)
Pt was called and given information, she verbalized understanding  

## 2019-06-20 NOTE — Telephone Encounter (Signed)
Please inform patient the following information: -Her liver, kidney and thyroid function are normal. -Cholesterol panel is about the same from last year, little bit lower with a total cholesterol 216, LDL 134, triglycerides 103 and HDL 61. -She had a mildly elevated white count during her visit.  This can indicate a mild illness.  Nothing further to do currently unless she starts to develop symptoms of illness in which she feels the need to be seen.  Otherwise, we will keep an eye on this and repeat at her next appointment to ensure it returns to normal. -Her diabetes screening/A1c increased from 5.7 to 6.0, her fasting glucose was perfect at 91.  Encourage routine exercise and dietary modifications to keep sugar in normal range.  She has been taking Mobic, prior scripts were written by orthopedics but I did refill that for her during her office visit.  However, I do make sure she does not take this medication if she has been having GI bleeding or at least 1 to 2 weeks prior to her colonoscopy coming up.  This medication can increase bleeding risks.

## 2019-07-09 ENCOUNTER — Encounter: Payer: Self-pay | Admitting: Family Medicine

## 2019-07-09 LAB — HM COLONOSCOPY

## 2019-07-25 ENCOUNTER — Encounter: Payer: Self-pay | Admitting: Family Medicine

## 2019-08-02 ENCOUNTER — Encounter: Payer: Self-pay | Admitting: Family Medicine

## 2019-08-02 ENCOUNTER — Ambulatory Visit (INDEPENDENT_AMBULATORY_CARE_PROVIDER_SITE_OTHER): Payer: Medicare HMO | Admitting: Family Medicine

## 2019-08-02 ENCOUNTER — Other Ambulatory Visit: Payer: Self-pay

## 2019-08-02 VITALS — BP 135/90 | HR 77 | Temp 97.8°F | Resp 18 | Ht 64.0 in | Wt 137.5 lb

## 2019-08-02 DIAGNOSIS — L0291 Cutaneous abscess, unspecified: Secondary | ICD-10-CM

## 2019-08-02 MED ORDER — MUPIROCIN 2 % EX OINT: TOPICAL_OINTMENT | CUTANEOUS | 0 refills | Status: DC

## 2019-08-02 MED ORDER — DOXYCYCLINE HYCLATE 100 MG PO TABS: 100.0000 mg | ORAL_TABLET | Freq: Two times a day (BID) | ORAL | 0 refills | Status: DC

## 2019-08-02 NOTE — Progress Notes (Signed)
This visit occurred during the SARS-CoV-2 public health emergency.  Safety protocols were in place, including screening questions prior to the visit, additional usage of staff PPE, and extensive cleaning of exam room while observing appropriate contact time as indicated for disinfecting solutions.    Angela [name] , [birth year], 69 y.o., female MRN: [id number] Patient Care Team    Relationship Specialty Notifications Start End  Ma Hillock, DO PCP - General Family Medicine  04/10/17   Arta Silence, MD Consulting Physician Gastroenterology  06/19/19     Chief Complaint  Patient presents with  . Cyst    Pt had bump come up on chin. Been there x1 week. Only painful when touched     Subjective: Pt presents for an OV with complaints of growth on right side of chin, started about 1 week ago. Continues to enlarge. Non-tender unless palpated.  No fever, chills or drainage. No prior h/o MRSA.   Depression screen [facility] 2/9 06/19/2019 09/12/2017 04/10/2017  Decreased Interest 0 0 0  Down, Depressed, Hopeless 0 0 0  PHQ - 2 Score 0 0 0    Allergies  Allergen Reactions  . Codeine Other (See Comments)    Irritable , insomnia   . Pollen Extract    Social History   Social History Narrative   Married. Retired. 2 children.   Bachelors degree.   Exercises routinely.   Drinks caffeine.   Smoke alarm in the home. Wears her seatbelt. Owns firearms.   Feels safe in her relationships.   Past Medical History:  Diagnosis Date  . Arthritis   . Blood in stool    greater than 4 years since last incidence of blood in stool   . Colon polyps   . Glaucoma   . Hyperlipidemia   . PNA (pneumonia) 2006   Past Surgical History:  Procedure Laterality Date  . BREAST BIOPSY Left 07/19/2018   US guided breast bx with clip placement.   Marland Kitchen EYE SURGERY  1956  . [place]  2001  . RADIOACTIVE SEED GUIDED EXCISIONAL BREAST BIOPSY Left 03/15/2019   Procedure: RADIOACTIVE SEED GUIDED EXCISIONAL  LEFT  BREAST BIOPSY;  Surgeon: Johnathan Hausen, MD;  Location: [facility];  Service: General;  Laterality: Left;  . TONSILLECTOMY  1963  . WISDOM TOOTH EXTRACTION     Family History  Problem Relation Age of Onset  . Heart attack Father   . Heart attack Sister   . Hyperlipidemia Sister   . Hypertension Sister    Allergies as of 08/02/2019      Reactions   Codeine Other (See Comments)   Irritable , insomnia    Pollen Extract       Medication List       Accurate as of August 02, 2019  9:48 AM. If you have any questions, ask your nurse or doctor.        cholecalciferol 1000 units tablet Commonly known as: VITAMIN D Take 1,000 Units by mouth daily.   DAILY PROBIOTIC PO Take by mouth.   doxycycline 100 MG tablet Commonly known as: VIBRA-TABS Take 1 tablet (100 mg total) by mouth 2 (two) times daily. Started by: Howard Pouch, DO   FIBER-CAPS PO Take by mouth.   latanoprost 0.005 % ophthalmic solution Commonly known as: XALATAN Place 1 drop into both eyes at bedtime.   Linzess 72 MCG capsule Generic drug: linaclotide Take 72 mcg by mouth every morning.   lisinopril 5 MG tablet  Commonly known as: ZESTRIL Take 1 tablet (5 mg total) by mouth daily.   meloxicam 7.5 MG tablet Commonly known as: MOBIC Take 1 tablet (7.5 mg total) by mouth daily.   multivitamin tablet Take 1 tablet by mouth daily.   timolol 0.5 % ophthalmic solution Commonly known as: BETIMOL Place 1 drop into both eyes daily.       All past medical history, surgical history, allergies, family history, immunizations andmedications were updated in the EMR today and reviewed under the history and medication portions of their EMR.     ROS: Negative, with the exception of above mentioned in HPI   Objective:  BP 135/90 (BP Location: Left Arm, Patient Position: Sitting, Cuff Size: Normal)   Pulse 77   Temp 97.8 F (36.6 C) (Temporal)   Resp 18   Ht 5\' 4"  (1.626 m)   Wt 137 lb 8 oz  (62.4 kg)   SpO2 98%   BMI 23.60 kg/m  Body mass index is 23.6 kg/m. Gen: Afebrile. No acute distress. Nontoxic in appearance, well developed, well nourished.  HENT: AT. [place].  Skin: no rashes, purpura or petechiae. ~1.5 cm round raised mass right chin along jaw line. TTP. No drainage Neuro: Normal gait. PERLA. EOMi. Alert. Oriented x3    Procedure Note:  PROCEDURE NOTE: Incision and Drainage Performed by: Dr. Indication: abscess rt chin/haw line Anesthesia was obtained with 1 ml of 1% lidocaine with epi . The area was prepped in the usual sterile fashion. A number 11 scalpel was used to create an incision inferior portion of abscess to allow drainage. Return was ~1- cc of light green pus-like  fluid. Culture was  Obtained. Abscess was irrigated with sterile saline. The site was not packed. A pressure dressing was placed over the site. The patient tolerated the procedure well. Wound care instructions were given. Patient to follow up 1 week, sooner if needed.  Post-Procedure Diagnosis: abscess Complications: None Estimated Blood Loss:  none   Assessment/Plan: Angela [name] is a 69 y.o. female present for OV for  Abscess - rt chin abscess. - I&D today- pt tolerated well.  - wound care instructions provided via avs.  Bactroban ointment BID. Doxy BId x5d - Wound culture - f/u 1 week, sooner if needed.    Reviewed expectations re: course of current medical issues.  Discussed self-management of symptoms.  Outlined signs and symptoms indicating need for more acute intervention.  Patient verbalized understanding and all questions were answered.  Patient received an After-Visit Summary.    No orders of the defined types were placed in this encounter.  Meds ordered this encounter  Medications  . doxycycline (VIBRA-TABS) 100 MG tablet    Sig: Take 1 tablet (100 mg total) by mouth 2 (two) times daily.    Dispense:  10 tablet    Refill:  0   Referral Orders  No  referral(s) requested today     Note is dictated utilizing voice recognition software. Although note has been proof read prior to signing, occasional typographical errors still can be missed. If any questions arise, please do not hesitate to call for verification.   electronically signed by:  [id number], DO  [facility] [place]

## 2019-08-02 NOTE — Patient Instructions (Signed)
Keep area clean and dry. Cleanse with warm water and soap. Use Bactroban cream/ointment prescribed twice a day.  Doxycycline every 12 hours 5 days also prescribed.   Follow up 1 week for wound check. Monitor for swelling, redness or drainage.   Keep pressure dressing in place for 24 hours, then start changing.

## 2019-08-05 ENCOUNTER — Telehealth: Payer: Self-pay

## 2019-08-05 LAB — WOUND CULTURE
MICRO NUMBER:: 10635893
SPECIMEN QUALITY:: ADEQUATE

## 2019-08-05 NOTE — Telephone Encounter (Signed)
Please call regarding after hours call.  Please call patient at [phone]

## 2019-08-05 NOTE — Telephone Encounter (Signed)
Please put her in today's 1130 slot.

## 2019-08-05 NOTE — Telephone Encounter (Signed)
1130 am slot is filled today and someone was placed in tomorrows 1130 acute slot. Pt was placed on at 4pm tomorrow for 30 mins.

## 2019-08-05 NOTE — Telephone Encounter (Signed)
Per after hours note- Pt had abscess drained on Friday and has another bump that came up Saturday on her face. It is dime size.  Pt has taken 6 doses of abx. Please advise if pt can you 1130 time slot tomorrow or if you need her to come before,

## 2019-08-06 ENCOUNTER — Other Ambulatory Visit: Payer: Self-pay

## 2019-08-06 ENCOUNTER — Encounter: Payer: Self-pay | Admitting: Family Medicine

## 2019-08-06 ENCOUNTER — Ambulatory Visit (INDEPENDENT_AMBULATORY_CARE_PROVIDER_SITE_OTHER): Payer: Medicare HMO | Admitting: Family Medicine

## 2019-08-06 VITALS — BP 135/84 | HR 77 | Temp 98.0°F | Resp 16 | Ht 64.0 in

## 2019-08-06 DIAGNOSIS — L0291 Cutaneous abscess, unspecified: Secondary | ICD-10-CM

## 2019-08-06 DIAGNOSIS — L0201 Cutaneous abscess of face: Secondary | ICD-10-CM | POA: Diagnosis not present

## 2019-08-06 MED ORDER — AMOXICILLIN-POT CLAVULANATE 875-125 MG PO TABS: 1.0000 | ORAL_TABLET | Freq: Two times a day (BID) | ORAL | 0 refills | Status: DC

## 2019-08-06 NOTE — Progress Notes (Signed)
This visit occurred during the SARS-CoV-2 public health emergency.  Safety protocols were in place, including screening questions prior to the visit, additional usage of staff PPE, and extensive cleaning of exam room while observing appropriate contact time as indicated for disinfecting solutions.    Angela [name] , 1950-07-04, 69 y.o., female MRN: [id number] Patient Care Team    Relationship Specialty Notifications Start End  Natalia Leatherwood, DO PCP - General Family Medicine  04/10/17   Willis Modena, MD Consulting Physician Gastroenterology  06/19/19     Chief Complaint  Patient presents with  . Recurrent Skin Infections     Subjective:  Pt presents for an OV with complaints of growth of abscess on left side of chin and smaller but reformed abscess on the right side of her chin from I&D 4 days ago. Pt reports she kept the pressure dressing on her chin for 24 hours- but was have difficulty keeping dressing on her face from tape peeling off.  MRSA cx is negative. She did finish her doxy.  She denies fever.   Depression screen [facility] 2/9 06/19/2019 09/12/2017 04/10/2017  Decreased Interest 0 0 0  Down, Depressed, Hopeless 0 0 0  PHQ - 2 Score 0 0 0    Allergies  Allergen Reactions  . Codeine Other (See Comments)    Irritable , insomnia   . Pollen Extract    Social History   Social History Narrative   Married. Retired. 2 children.   Bachelors degree.   Exercises routinely.   Drinks caffeine.   Smoke alarm in the home. Wears her seatbelt. Owns firearms.   Feels safe in her relationships.   Past Medical History:  Diagnosis Date  . Arthritis   . Blood in stool    greater than 4 years since last incidence of blood in stool   . Colon polyps   . Glaucoma   . Hyperlipidemia   . PNA (pneumonia) 2006   Past Surgical History:  Procedure Laterality Date  . BREAST BIOPSY Left 07/19/2018   US guided breast bx with clip placement.   Marland Kitchen EYE SURGERY  1956  . HEMORRHOID SURGERY   2001  . RADIOACTIVE SEED GUIDED EXCISIONAL BREAST BIOPSY Left 03/15/2019   Procedure: RADIOACTIVE SEED GUIDED EXCISIONAL LEFT  BREAST BIOPSY;  Surgeon: Luretha Murphy, MD;  Location: [facility];  Service: General;  Laterality: Left;  . TONSILLECTOMY  1963  . WISDOM TOOTH EXTRACTION     Family History  Problem Relation Age of Onset  . Heart attack Father   . Heart attack Sister   . Hyperlipidemia Sister   . Hypertension Sister    Allergies as of 08/06/2019      Reactions   Codeine Other (See Comments)   Irritable , insomnia    Pollen Extract       Medication List       Accurate as of August 06, 2019 11:59 PM. If you have any questions, ask your nurse or doctor.        amoxicillin-clavulanate 875-125 MG tablet Commonly known as: AUGMENTIN Take 1 tablet by mouth 2 (two) times daily. Started by: Felix Pacini, DO   cholecalciferol 1000 units tablet Commonly known as: VITAMIN D Take 1,000 Units by mouth daily.   DAILY PROBIOTIC PO Take by mouth.   doxycycline 100 MG tablet Commonly known as: VIBRA-TABS Take 1 tablet (100 mg total) by mouth 2 (two) times daily.   FIBER-CAPS PO Take by mouth.  latanoprost 0.005 % ophthalmic solution Commonly known as: XALATAN Place 1 drop into both eyes at bedtime.   Linzess 72 MCG capsule Generic drug: linaclotide Take 72 mcg by mouth every morning.   lisinopril 5 MG tablet Commonly known as: ZESTRIL Take 1 tablet (5 mg total) by mouth daily.   meloxicam 7.5 MG tablet Commonly known as: MOBIC Take 1 tablet (7.5 mg total) by mouth daily.   multivitamin tablet Take 1 tablet by mouth daily.   mupirocin ointment 2 % Commonly known as: Bactroban Apply to wound twice daily.   timolol 0.5 % ophthalmic solution Commonly known as: BETIMOL Place 1 drop into both eyes daily.       All past medical history, surgical history, allergies, family history, immunizations andmedications were updated in the EMR today and  reviewed under the history and medication portions of their EMR.     ROS: Negative, with the exception of above mentioned in HPI   Objective:  BP 135/84 (BP Location: Right Arm, Patient Position: Sitting, Cuff Size: Normal)   Pulse 77   Temp 98 F (36.7 C) (Temporal)   Resp 16   Ht 5\' 4"  (1.626 m)   SpO2 98%   BMI 23.60 kg/m  Body mass index is 23.6 kg/m. Gen: Afebrile. No acute distress.  HENT: AT. [place].  Eyes:Pupils Equal Round Reactive to light, Extraocular movements intact,  Conjunctiva without redness, discharge or icterus. Neck/lymp/endocrine: Supple, no lymphadenopathy Skin: Left chin small 0.5 cm fluid-filled abscess, right chin 0.75 cm fluid-filled abscess    Procedure Note:  PROCEDURE NOTE: Incision and Drainage Performed by: Dr. Indication: abscess rt chin/jaw line and left chin jaw line Anesthesia was obtained with 1.5 ml of 1% lidocaine w/ epi . The area was prepped in the usual sterile fashion. A number 11 scalpel was used to create an incision posterior portion of abscess to allow drainage. Return was ~0.25 cc left abscess and~ 0.5cc right abscess of green fluid. Culture was not obtained since collected prior and MRSA negative. The site was  packed with iodoform plain in the right abscess. Left abscess to small to pack. A dressing was placed over the site. The patient tolerated the procedure well. Wound care instructions were given. Patient to follow up friday  Post-Procedure Diagnosis: abscess x2  Complications: None Estimated Blood Loss: none  Assessment/Plan: Angela [name] is a 69 y.o. female present for OV for  Abscess -Although smaller right chin abscess recurred and small left chin abscess has presented.  Possibly from patient's mask contaminating her face.  She states she does wear the same mask and has not switched masks in quite some time. Patient was encouraged to cleanse face daily with Hibiclens and warm water.  This was provided to her  today. - I&D today of bilateral abscesses on chin- pt tolerated well.  - wound care instructions provided via avs.  Imperative to keep pressure dressing in place. Daily trimming of right abscess wound packing instructions were provided her today. Bactroban ointment BID. Augmentin for 7 days prescribed  - Wound culture- Neg for MRSA - f/u this Friday   Reviewed expectations re: course of current medical issues.  Discussed self-management of symptoms.  Outlined signs and symptoms indicating need for more acute intervention.  Patient verbalized understanding and all questions were answered.  Patient received an After-Visit Summary.    No orders of the defined types were placed in this encounter.  Meds ordered this encounter  Medications  . DISCONTD: amoxicillin-clavulanate (  AUGMENTIN) 875-125 MG tablet    Sig: Take 1 tablet by mouth 2 (two) times daily.    Dispense:  14 tablet    Refill:  0   Referral Orders  No referral(s) requested today     Note is dictated utilizing voice recognition software. Although note has been proof read prior to signing, occasional typographical errors still can be missed. If any questions arise, please do not hesitate to call for verification.   electronically signed by:  Felix Pacini, DO  [facility] - [place]

## 2019-08-06 NOTE — Patient Instructions (Addendum)
Switched antibiotics to Augmentin - so you can eat yogurt. It is also every 12 hours for 7 days.   Keep a clean pressure dressing on area. Using instruments provided clean tips with alcohol then remove/pull out about 1/4 in of the packing daily. Eventually it will fall out.   Follow up Friday.  Clean face with Hibiclens and warm water daily after pulling packing.   Change out bed sheets.

## 2019-08-08 ENCOUNTER — Ambulatory Visit: Payer: Medicare HMO | Admitting: Family Medicine

## 2019-08-09 ENCOUNTER — Encounter: Payer: Self-pay | Admitting: Family Medicine

## 2019-08-09 ENCOUNTER — Ambulatory Visit (INDEPENDENT_AMBULATORY_CARE_PROVIDER_SITE_OTHER): Payer: Medicare HMO | Admitting: Family Medicine

## 2019-08-09 ENCOUNTER — Ambulatory Visit: Payer: Medicare HMO | Admitting: Family Medicine

## 2019-08-09 ENCOUNTER — Other Ambulatory Visit: Payer: Self-pay

## 2019-08-09 DIAGNOSIS — Z5189 Encounter for other specified aftercare: Secondary | ICD-10-CM

## 2019-08-09 MED ORDER — AMOXICILLIN-POT CLAVULANATE 875-125 MG PO TABS: 1.0000 | ORAL_TABLET | Freq: Two times a day (BID) | ORAL | 0 refills | Status: DC

## 2019-08-09 NOTE — Progress Notes (Signed)
This visit occurred during the SARS-CoV-2 public health emergency.  Safety protocols were in place, including screening questions prior to the visit, additional usage of staff PPE, and extensive cleaning of exam room while observing appropriate contact time as indicated for disinfecting solutions.    Angela [name] , Oct 26, 1950, 69 y.o., female MRN: [id number] Patient Care Team    Relationship Specialty Notifications Start End  Natalia Leatherwood, DO PCP - General Family Medicine  04/10/17   Willis Modena, MD Consulting Physician Gastroenterology  06/19/19     Chief Complaint  Patient presents with  . Wound Check     Subjective: Pt presents for wound check.    Depression screen [facility] 2/9 06/19/2019 09/12/2017 04/10/2017  Decreased Interest 0 0 0  Down, Depressed, Hopeless 0 0 0  PHQ - 2 Score 0 0 0    Allergies  Allergen Reactions  . Codeine Other (See Comments)    Irritable , insomnia   . Pollen Extract    Social History   Social History Narrative   Married. Retired. 2 children.   Bachelors degree.   Exercises routinely.   Drinks caffeine.   Smoke alarm in the home. Wears her seatbelt. Owns firearms.   Feels safe in her relationships.   Past Medical History:  Diagnosis Date  . Arthritis   . Blood in stool    greater than 4 years since last incidence of blood in stool   . Colon polyps   . Glaucoma   . Hyperlipidemia   . PNA (pneumonia) 2006   Past Surgical History:  Procedure Laterality Date  . BREAST BIOPSY Left 07/19/2018   US guided breast bx with clip placement.   Marland Kitchen EYE SURGERY  1956  . HEMORRHOID SURGERY  2001  . RADIOACTIVE SEED GUIDED EXCISIONAL BREAST BIOPSY Left 03/15/2019   Procedure: RADIOACTIVE SEED GUIDED EXCISIONAL LEFT  BREAST BIOPSY;  Surgeon: Luretha Murphy, MD;  Location: [facility];  Service: General;  Laterality: Left;  . TONSILLECTOMY  1963  . WISDOM TOOTH EXTRACTION     Family History  Problem Relation Age of Onset  .  Heart attack Father   . Heart attack Sister   . Hyperlipidemia Sister   . Hypertension Sister    Allergies as of 08/09/2019      Reactions   Codeine Other (See Comments)   Irritable , insomnia    Pollen Extract       Medication List       Accurate as of August 09, 2019  8:06 AM. If you have any questions, ask your nurse or doctor.        STOP taking these medications   doxycycline 100 MG tablet Commonly known as: VIBRA-TABS Stopped by: Felix Pacini, DO     TAKE these medications   amoxicillin-clavulanate 875-125 MG tablet Commonly known as: AUGMENTIN Take 1 tablet by mouth 2 (two) times daily.   cholecalciferol 1000 units tablet Commonly known as: VITAMIN D Take 1,000 Units by mouth daily.   DAILY PROBIOTIC PO Take by mouth.   FIBER-CAPS PO Take by mouth.   latanoprost 0.005 % ophthalmic solution Commonly known as: XALATAN Place 1 drop into both eyes at bedtime.   Linzess 72 MCG capsule Generic drug: linaclotide Take 72 mcg by mouth every morning.   lisinopril 5 MG tablet Commonly known as: ZESTRIL Take 1 tablet (5 mg total) by mouth daily.   meloxicam 7.5 MG tablet Commonly known as: MOBIC Take 1 tablet (  7.5 mg total) by mouth daily.   multivitamin tablet Take 1 tablet by mouth daily.   mupirocin ointment 2 % Commonly known as: Bactroban Apply to wound twice daily.   timolol 0.5 % ophthalmic solution Commonly known as: BETIMOL Place 1 drop into both eyes daily.       All past medical history, surgical history, allergies, family history, immunizations andmedications were updated in the EMR today and reviewed under the history and medication portions of their EMR.     ROS: Negative, with the exception of above mentioned in HPI   Objective:  There were no vitals taken for this visit. There is no height or weight on file to calculate BMI. Gen: Afebrile. No acute distress. Nontoxic in appearance, well developed, well nourished.  HENT: AT. [place].    Skin: Bilateral chin abscess are healing well. No redness, drainage. Packing absent.   No exam data present No results found. No results found for this or any previous visit (from the past 24 hour(s)).  Assessment/Plan: Angela [name] is a 69 y.o. female present for OV for  Wound check, abscess Improved, packing has completely fallen out. No infectious signs.  Called in abx in the event it appears to become infected next week when I am out. She was instructed to only use if needed. If recurs would encourage her at this point to seek urgent care or we would need to refer her to gen surg. Pt reports she understands instructions and appreciative.    Reviewed expectations re: course of current medical issues.  Discussed self-management of symptoms.  Outlined signs and symptoms indicating need for more acute intervention.  Patient verbalized understanding and all questions were answered.  Patient received an After-Visit Summary.    No orders of the defined types were placed in this encounter.  Meds ordered this encounter  Medications  . amoxicillin-clavulanate (AUGMENTIN) 875-125 MG tablet    Sig: Take 1 tablet by mouth 2 (two) times daily.    Dispense:  14 tablet    Refill:  0   Referral Orders  No referral(s) requested today     Note is dictated utilizing voice recognition software. Although note has been proof read prior to signing, occasional typographical errors still can be missed. If any questions arise, please do not hesitate to call for verification.   electronically signed by:  Felix Pacini, DO  [facility] - [place]

## 2019-09-02 ENCOUNTER — Encounter: Payer: Self-pay | Admitting: Family Medicine

## 2019-09-02 ENCOUNTER — Ambulatory Visit (INDEPENDENT_AMBULATORY_CARE_PROVIDER_SITE_OTHER): Payer: Medicare HMO | Admitting: Family Medicine

## 2019-09-02 ENCOUNTER — Other Ambulatory Visit: Payer: Self-pay

## 2019-09-02 VITALS — BP 143/90 | HR 74 | Temp 98.0°F | Resp 17 | Ht 64.0 in | Wt 138.2 lb

## 2019-09-02 DIAGNOSIS — L0291 Cutaneous abscess, unspecified: Secondary | ICD-10-CM | POA: Diagnosis not present

## 2019-09-02 NOTE — Progress Notes (Signed)
This visit occurred during the SARS-CoV-2 public health emergency.  Safety protocols were in place, including screening questions prior to the visit, additional usage of staff PPE, and extensive cleaning of exam room while observing appropriate contact time as indicated for disinfecting solutions.    Angela [name] , May 02, 1950, 69 y.o., female MRN: [id number] Patient Care Team    Relationship Specialty Notifications Start End  Natalia Leatherwood, DO PCP - General Family Medicine  04/10/17   Willis Modena, MD Consulting Physician Gastroenterology  06/19/19     Chief Complaint  Patient presents with  . Abscess    Pt has finished abx and two abscess are still on face      Subjective: Angela [name] is a 69 y.o. female presents to discuss her facial abscess. She reports both have healed well after I&D. Neither are red, swollen or have drainage. She reports a mass remains over each area.   Depression screen [facility] 2/9 06/19/2019 09/12/2017 04/10/2017  Decreased Interest 0 0 0  Down, Depressed, Hopeless 0 0 0  PHQ - 2 Score 0 0 0    Allergies  Allergen Reactions  . Codeine Other (See Comments)    Irritable , insomnia   . Pollen Extract    Social History   Social History Narrative   Married. Retired. 2 children.   Bachelors degree.   Exercises routinely.   Drinks caffeine.   Smoke alarm in the home. Wears her seatbelt. Owns firearms.   Feels safe in her relationships.   Past Medical History:  Diagnosis Date  . Arthritis   . Blood in stool    greater than 4 years since last incidence of blood in stool   . Colon polyps   . Glaucoma   . Hyperlipidemia   . PNA (pneumonia) 2006   Past Surgical History:  Procedure Laterality Date  . BREAST BIOPSY Left 07/19/2018   US guided breast bx with clip placement.   Marland Kitchen EYE SURGERY  1956  . HEMORRHOID SURGERY  2001  . RADIOACTIVE SEED GUIDED EXCISIONAL BREAST BIOPSY Left 03/15/2019   Procedure: RADIOACTIVE SEED GUIDED EXCISIONAL LEFT   BREAST BIOPSY;  Surgeon: Luretha Murphy, MD;  Location: [facility];  Service: General;  Laterality: Left;  . TONSILLECTOMY  1963  . WISDOM TOOTH EXTRACTION     Family History  Problem Relation Age of Onset  . Heart attack Father   . Heart attack Sister   . Hyperlipidemia Sister   . Hypertension Sister    Allergies as of 09/02/2019      Reactions   Codeine Other (See Comments)   Irritable , insomnia    Pollen Extract       Medication List       Accurate as of September 02, 2019  4:17 PM. If you have any questions, ask your nurse or doctor.        amoxicillin-clavulanate 875-125 MG tablet Commonly known as: AUGMENTIN Take 1 tablet by mouth 2 (two) times daily.   cholecalciferol 1000 units tablet Commonly known as: VITAMIN D Take 1,000 Units by mouth daily.   DAILY PROBIOTIC PO Take by mouth.   FIBER-CAPS PO Take by mouth.   latanoprost 0.005 % ophthalmic solution Commonly known as: XALATAN Place 1 drop into both eyes at bedtime.   Linzess 72 MCG capsule Generic drug: linaclotide Take 72 mcg by mouth every morning.   lisinopril 5 MG tablet Commonly known as: ZESTRIL Take 1 tablet (5 mg  total) by mouth daily.   meloxicam 7.5 MG tablet Commonly known as: MOBIC Take 1 tablet (7.5 mg total) by mouth daily.   multivitamin tablet Take 1 tablet by mouth daily.   mupirocin ointment 2 % Commonly known as: Bactroban Apply to wound twice daily.   timolol 0.5 % ophthalmic solution Commonly known as: BETIMOL Place 1 drop into both eyes daily.       All past medical history, surgical history, allergies, family history, immunizations andmedications were updated in the EMR today and reviewed under the history and medication portions of their EMR.     ROS: Negative, with the exception of above mentioned in HPI   Objective:  BP (!) 143/90 (BP Location: Right Arm, Patient Position: Sitting, Cuff Size: Normal)   Pulse 74   Temp 98 F (36.7 C)  (Temporal)   Resp 17   Ht 5\' 4"  (1.626 m)   Wt 138 lb 4 oz (62.7 kg)   SpO2 97%   BMI 23.73 kg/m  Body mass index is 23.73 kg/m. Gen: Afebrile. No acute distress.  HENT: AT. [place].  Eyes:Pupils Equal Round Reactive to light, Extraocular movements intact,  Conjunctiva without redness, discharge or icterus. Skin: no rashes, purpura or petechiae. Bilateral facial cyst/abscess on chin- left 1.0 cm and right 0.5 cm Neuro: Normal gait. PERLA. EOMi. Alert. Oriented x3  Psych: Normal affect, dress and demeanor. Normal speech. Normal thought content and judgment.   No exam data present No results found. No results found for this or any previous visit (from the past 24 hour(s)).  Assessment/Plan: Angela [name] is a 69 y.o. female present for OV for  Abscess: Does not appear infected today. Masses still present> although much smaller. Discussed need of surgical removal for complete resolution. She is agreeable to referral today.  Continue facial cleansing w/ Hibiclens.  She will monitor for redness, swelling or fever> if occurs before surgery evaluates her she should follow up here.   Reviewed expectations re: course of current medical issues.  Discussed self-management of symptoms.  Outlined signs and symptoms indicating need for more acute intervention.  Patient verbalized understanding and all questions were answered.  Patient received an After-Visit Summary.    Orders Placed This Encounter  Procedures  . Ambulatory referral to General Surgery   No orders of the defined types were placed in this encounter.   Referral Orders     Ambulatory referral to General Surgery   Note is dictated utilizing voice recognition software. Although note has been proof read prior to signing, occasional typographical errors still can be missed. If any questions arise, please do not hesitate to call for verification.   electronically signed by:  73, DO  [facility] -  [place]

## 2019-09-02 NOTE — Patient Instructions (Signed)
We will refer you to Surgery to have cyst removed.  They will call to get you in.

## 2019-09-12 ENCOUNTER — Telehealth: Payer: Self-pay | Admitting: Family Medicine

## 2019-09-12 NOTE — Telephone Encounter (Signed)
Pt calling to check the status of her [facility] referral. Is hoping to get in soon - otherwise she is willing to see [facility] plastics. Wants to get in as soon as possible.

## 2019-09-25 NOTE — Telephone Encounter (Signed)
Appt scheduled with [facility] on 10/17/19

## 2019-10-17 ENCOUNTER — Other Ambulatory Visit: Payer: Self-pay

## 2019-10-17 ENCOUNTER — Encounter: Payer: Self-pay | Admitting: Plastic Surgery

## 2019-10-17 ENCOUNTER — Ambulatory Visit: Payer: Medicare HMO | Admitting: Plastic Surgery

## 2019-10-17 VITALS — BP 166/95 | HR 71 | Temp 98.2°F | Ht 64.0 in | Wt 136.8 lb

## 2019-10-17 DIAGNOSIS — L989 Disorder of the skin and subcutaneous tissue, unspecified: Secondary | ICD-10-CM | POA: Diagnosis not present

## 2019-10-17 NOTE — Progress Notes (Signed)
Referring Provider Felix Pacini A, DO [address] [place],  [place] [address]   CC:  Chief Complaint  Patient presents with  . Advice Only      Angela [name] is an 69 y.o. female.  HPI: Patient presents with a cystic lesion on her right shin that has been draining intermittently for the past several months.  It came up around 6 months ago and has been intermittently draining ever since.  It is also painful at times.  She is tried antibiotics that have failed to resolve it and she would like to have it removed.  Allergies  Allergen Reactions  . Codeine Other (See Comments)    Irritable , insomnia   . Pollen Extract     Outpatient Encounter Medications as of 10/17/2019  Medication Sig  . Calcium Polycarbophil (FIBER-CAPS PO) Take by mouth.  . cholecalciferol (VITAMIN D) 1000 units tablet Take 1,000 Units by mouth daily.  Marland Kitchen latanoprost (XALATAN) 0.005 % ophthalmic solution Place 1 drop into both eyes at bedtime.  Marland Kitchen LINZESS 72 MCG capsule Take 72 mcg by mouth every morning.  Marland Kitchen lisinopril (ZESTRIL) 5 MG tablet Take 1 tablet (5 mg total) by mouth daily.  . meloxicam (MOBIC) 7.5 MG tablet Take 1 tablet (7.5 mg total) by mouth daily.  . Multiple Vitamin (MULTIVITAMIN) tablet Take 1 tablet by mouth daily.  . mupirocin ointment (BACTROBAN) 2 % Apply to wound twice daily.  . Probiotic Product (DAILY PROBIOTIC PO) Take by mouth.  . timolol (BETIMOL) 0.5 % ophthalmic solution Place 1 drop into both eyes daily.  Marland Kitchen amoxicillin-clavulanate (AUGMENTIN) 875-125 MG tablet Take 1 tablet by mouth 2 (two) times daily. (Patient not taking: Reported on 09/02/2019)   No facility-administered encounter medications on file as of 10/17/2019.     Past Medical History:  Diagnosis Date  . Arthritis   . Blood in stool    greater than 4 years since last incidence of blood in stool   . Colon polyps   . Glaucoma   . Hyperlipidemia   . PNA (pneumonia) 2006    Past Surgical History:  Procedure Laterality  Date  . BREAST BIOPSY Left 07/19/2018   US guided breast bx with clip placement.   Marland Kitchen EYE SURGERY  1956  . HEMORRHOID SURGERY  2001  . RADIOACTIVE SEED GUIDED EXCISIONAL BREAST BIOPSY Left 03/15/2019   Procedure: RADIOACTIVE SEED GUIDED EXCISIONAL LEFT  BREAST BIOPSY;  Surgeon: Luretha Murphy, MD;  Location: [facility];  Service: General;  Laterality: Left;  . TONSILLECTOMY  1963  . WISDOM TOOTH EXTRACTION      Family History  Problem Relation Age of Onset  . Heart attack Father   . Heart attack Sister   . Hyperlipidemia Sister   . Hypertension Sister     Social History   Social History Narrative   Married. Retired. 2 children.   Bachelors degree.   Exercises routinely.   Drinks caffeine.   Smoke alarm in the home. Wears her seatbelt. Owns firearms.   Feels safe in her relationships.     Review of Systems General: Denies fevers, chills, weight loss CV: Denies chest pain, shortness of breath, palpitations  Physical Exam Vitals with BMI 10/17/2019 09/02/2019 08/06/2019  Height 5\' 4"  5\' 4"  5\' 4"   Weight 136 lbs 13 oz 138 lbs 4 oz (No Data)  BMI 23.47 23.72 -  Systolic 166 143  Diastolic 95 90 84  Pulse 71 74 77  General:  No acute distress,  Alert and oriented, Non-Toxic, Normal speech and affect Examination shows a 2 cm cystic lesion in the right chin area.  The overlying skin is atrophied.  There does appear to be a punctum.  There is no active drainage at the moment.  There is no surrounding erythema or fluctuance to suggest acute infection.  She has another small site on the left side of her chin that has healed up and is no longer draining.  Assessment/Plan We discussed excision of the cystic lesion on the right chin.  We discussed the risks include bleeding, infection, damage to surrounding structures, need for additional procedures.  I think she would tolerate this fine under local.  We will plan to schedule it for her shortly.  [name] 10/17/2019, 9:16 AM

## 2019-10-29 ENCOUNTER — Telehealth (INDEPENDENT_AMBULATORY_CARE_PROVIDER_SITE_OTHER): Payer: Medicare HMO | Admitting: Family Medicine

## 2019-10-29 DIAGNOSIS — L989 Disorder of the skin and subcutaneous tissue, unspecified: Secondary | ICD-10-CM

## 2019-10-29 MED ORDER — DOXYCYCLINE HYCLATE 100 MG PO TABS: 100.0000 mg | ORAL_TABLET | Freq: Two times a day (BID) | ORAL | 0 refills | Status: DC

## 2019-10-29 NOTE — Progress Notes (Signed)
Virtual Visit via Video Note  I connected with Angela [name]  on 10/29/19 at 12:20 PM EDT by a video enabled telemedicine application and verified that I am speaking with the correct person using two identifiers.  Location patient: home, [facility] provider:work or home office Persons participating in the virtual visit: patient, provider  I discussed the limitations of evaluation and management by telemedicine and the availability of in person appointments. The patient expressed understanding and agreed to proceed.   HPI:  Acute telemedicine visit for : -Onset: Several months ago according to plastic surgery notes, but the worsening swelling has been for about 4-5 days -Seen by plastic surgery for this on 10/17/2019 -it looks like at the time they were planning to schedule removal of the cyst as she had had several months of dealing with this -Per review of chart had seen her primary care physician several times for another cyst with I&D and treatment with antibiotics in the past - has appointment in a few weeks to remove that cyst -Symptoms include: 2 cysts on the chin, one has been drained and had abx, this is the other one - now red, swollen, painful -Denies: fevers, malaise, cysts elsewhere on the body -Pertinent past medical history: denies hx of other skin issues or immunocomprimise -Pertinent medication allergies: denies any allergies to abx  ROS: See pertinent positives and negatives per HPI.  Past Medical History:  Diagnosis Date  . Arthritis   . Blood in stool    greater than 4 years since last incidence of blood in stool   . Colon polyps   . Glaucoma   . Hyperlipidemia   . PNA (pneumonia) 2006    Past Surgical History:  Procedure Laterality Date  . BREAST BIOPSY Left 07/19/2018   US guided breast bx with clip placement.   Marland Kitchen EYE SURGERY  1956  . HEMORRHOID SURGERY  2001  . RADIOACTIVE SEED GUIDED EXCISIONAL BREAST BIOPSY Left 03/15/2019   Procedure: RADIOACTIVE  SEED GUIDED EXCISIONAL LEFT  BREAST BIOPSY;  Surgeon: Luretha Murphy, MD;  Location: [facility];  Service: General;  Laterality: Left;  . TONSILLECTOMY  1963  . WISDOM TOOTH EXTRACTION       Current Outpatient Medications:  .  Calcium Polycarbophil (FIBER-CAPS PO), Take by mouth., Disp: , Rfl:  .  cholecalciferol (VITAMIN D) 1000 units tablet, Take 1,000 Units by mouth daily., Disp: , Rfl:  .  doxycycline (VIBRA-TABS) 100 MG tablet, Take 1 tablet (100 mg total) by mouth 2 (two) times daily., Disp: 20 tablet, Rfl: 0 .  latanoprost (XALATAN) 0.005 % ophthalmic solution, Place 1 drop into both eyes at bedtime., Disp: , Rfl: 4 .  LINZESS 72 MCG capsule, Take 72 mcg by mouth every morning., Disp: , Rfl:  .  lisinopril (ZESTRIL) 5 MG tablet, Take 1 tablet (5 mg total) by mouth daily., Disp: 90 tablet, Rfl: 1 .  meloxicam (MOBIC) 7.5 MG tablet, Take 1 tablet (7.5 mg total) by mouth daily., Disp: 90 tablet, Rfl: 1 .  Multiple Vitamin (MULTIVITAMIN) tablet, Take 1 tablet by mouth daily., Disp: , Rfl:  .  mupirocin ointment (BACTROBAN) 2 %, Apply to wound twice daily., Disp: 22 g, Rfl: 0 .  Probiotic Product (DAILY PROBIOTIC PO), Take by mouth., Disp: , Rfl:  .  timolol (BETIMOL) 0.5 % ophthalmic solution, Place 1 drop into both eyes daily., Disp: , Rfl:   EXAM:  VITALS per patient if applicable:  GENERAL: alert, oriented, appears well and in  no acute distress  HEENT: atraumatic, conjunttiva clear, no obvious abnormalities on inspection of external nose and ears  NECK: normal movements of the head and neck  LUNGS: on inspection no signs of respiratory distress, breathing rate appears normal, no obvious gross SOB, gasping or wheezing  CV: no obvious cyanosis  MS: moves all visible extremities without noticeable abnormality  SKIN: erythematous dome  Shaped lesion on R chin approx 1-2 cm in diameter with minimal surrounding erythema, smaller healing lesion nearby.    PSYCH/NEURO: pleasant and cooperative, no obvious depression or anxiety, speech and thought processing grossly intact  ASSESSMENT AND PLAN:  Discussed the following assessment and plan:  Skin lesion  -we discussed possible serious and likely etiologies, options for evaluation and workup, limitations of telemedicine visit vs in person visit, treatment, treatment risks and precautions. Pt prefers to treat via telemedicine empirically rather then risking or undertaking an in person visit at this moment.  Query inflamed cyst versus abscess.  Appears similar to bad cystic acne.  She is currently seeing plastics for this, but was unable to get an appointment until weeks from now.  She had issues with a nearby cyst that required I&D and antibiotics in the past.  Reports this is the first time this cyst has become inflamed or swollen.  Discussed options including in person evaluation for I&D, compresses, antibiotics, steroids.  She prefers to try doxycycline 100 mg twice daily for 10 days empirically and topical hydrocortisone cream.  She agrees to follow-up closely with her primary care office. Scheduled follow up with PCP offered: Patient agrees to call her primary care office today to schedule follow-up in the next 3 to 5 days. Advised to seek prompt follow up telemedicine visit or in person care sooner if worsening, new symptoms arise, or if is not improving with treatment. Did let this patient know that I only do telemedicine on Tuesdays and Thursdays for [place]. Advised to schedule follow up visit with PCP or [facility] if any further questions or concerns to avoid delays in care.   I discussed the assessment and treatment plan with the patient. The patient was provided an opportunity to ask questions and all were answered. The patient agreed with the plan and demonstrated an understanding of the instructions.     Terressa Koyanagi, DO

## 2019-10-29 NOTE — Patient Instructions (Signed)
-  I sent the medication(s) we discussed to your pharmacy: Meds ordered this encounter  Medications  . doxycycline (VIBRA-TABS) 100 MG tablet    Sig: Take 1 tablet (100 mg total) by mouth 2 (two) times daily.    Dispense:  20 tablet    Refill:  0    Call your primary care office today to schedule a follow-up visit in about 3 to 5 days.  I hope you are feeling better soon! Seek care promptly if your symptoms worsen, new concerns arise or you are not improving with treatment.

## 2019-11-27 ENCOUNTER — Other Ambulatory Visit: Payer: Self-pay

## 2019-11-27 ENCOUNTER — Other Ambulatory Visit (HOSPITAL_COMMUNITY)
Admission: RE | Admit: 2019-11-27 | Discharge: 2019-11-27 | Disposition: A | Discharge status: Home / Self Care | Payer: Medicare HMO | Source: Ambulatory Visit | Attending: Plastic Surgery | Admitting: Plastic Surgery

## 2019-11-27 ENCOUNTER — Ambulatory Visit: Payer: Medicare HMO | Admitting: Plastic Surgery

## 2019-11-27 DIAGNOSIS — L988 Other specified disorders of the skin and subcutaneous tissue: Secondary | ICD-10-CM

## 2019-11-27 DIAGNOSIS — L989 Disorder of the skin and subcutaneous tissue, unspecified: Secondary | ICD-10-CM | POA: Insufficient documentation

## 2019-11-27 DIAGNOSIS — L720 Epidermal cyst: Secondary | ICD-10-CM | POA: Diagnosis not present

## 2019-11-27 NOTE — Progress Notes (Signed)
Operative Note   DATE OF OPERATION: 11/27/2019  LOCATION:    SURGICAL DEPARTMENT: Plastic Surgery  PREOPERATIVE DIAGNOSES:  Right shin skin lesion  POSTOPERATIVE DIAGNOSES:  same  PROCEDURE:  1. Excision of right chin skin lesion measuring 2.5 cm 2. Complex closure measuring 2.5 cm  SURGEON: Ancil Linsey, MD  ANESTHESIA:  Local  COMPLICATIONS: None.   INDICATIONS FOR PROCEDURE:  The patient, Angela [name] is a 69 y.o. female born on 14-Jul-1950, is here for treatment of right chin skin lesion MRN: [id number]  CONSENT:  Informed consent was obtained directly from the patient. Risks, benefits and alternatives were fully discussed. Specific risks including but not limited to bleeding, infection, hematoma, seroma, scarring, pain, infection, wound healing problems, and need for further surgery were all discussed. The patient did have an ample opportunity to have questions answered to satisfaction.   DESCRIPTION OF PROCEDURE:  Local anesthesia was administered. The patient's operative site was prepped and draped in a sterile fashion. A time out was performed and all information was confirmed to be correct.  The lesion was excised with a 15 blade.  Hemostasis was obtained.  Circumferential undermining was performed and the skin was advanced and closed in layers with interrupted buried Monocryl sutures and 5-0 fast gut for the skin.  The lesion excised measured 2.5 cm, and the total length of closure measured 2.5 cm.    The patient tolerated the procedure well.  There were no complications.

## 2019-12-02 LAB — SURGICAL PATHOLOGY

## 2020-01-20 ENCOUNTER — Other Ambulatory Visit: Payer: Self-pay

## 2020-01-20 MED ORDER — LISINOPRIL 5 MG PO TABS: 5.0000 mg | ORAL_TABLET | Freq: Every day | ORAL | 0 refills | Status: DC

## 2020-01-21 ENCOUNTER — Other Ambulatory Visit: Payer: Self-pay | Admitting: Family Medicine

## 2020-01-21 DIAGNOSIS — Z1231 Encounter for screening mammogram for malignant neoplasm of breast: Secondary | ICD-10-CM

## 2020-01-22 ENCOUNTER — Ambulatory Visit (INDEPENDENT_AMBULATORY_CARE_PROVIDER_SITE_OTHER): Payer: Medicare HMO | Admitting: Family Medicine

## 2020-01-22 ENCOUNTER — Other Ambulatory Visit: Payer: Self-pay

## 2020-01-22 ENCOUNTER — Encounter: Payer: Self-pay | Admitting: Family Medicine

## 2020-01-22 VITALS — BP 129/79 | HR 70 | Temp 98.1°F | Ht 64.0 in | Wt 139.0 lb

## 2020-01-22 DIAGNOSIS — I10 Essential (primary) hypertension: Secondary | ICD-10-CM | POA: Diagnosis not present

## 2020-01-22 DIAGNOSIS — R7309 Other abnormal glucose: Secondary | ICD-10-CM

## 2020-01-22 DIAGNOSIS — D72829 Elevated white blood cell count, unspecified: Secondary | ICD-10-CM | POA: Diagnosis not present

## 2020-01-22 LAB — CBC WITH DIFFERENTIAL/PLATELET
Basophils Absolute: 0.1 10*3/uL (ref 0.0–0.1)
Basophils Relative: 1 % (ref 0.0–3.0)
Eosinophils Absolute: 0.3 10*3/uL (ref 0.0–0.7)
Eosinophils Relative: 2.9 % (ref 0.0–5.0)
HCT: 41.4 % (ref 36.0–46.0)
Hemoglobin: 13.9 g/dL (ref 12.0–15.0)
Lymphocytes Relative: 16.9 % (ref 12.0–46.0)
Lymphs Abs: 1.8 10*3/uL (ref 0.7–4.0)
MCHC: 33.6 g/dL (ref 30.0–36.0)
MCV: 91.3 fl (ref 78.0–100.0)
Monocytes Absolute: 0.6 10*3/uL (ref 0.1–1.0)
Monocytes Relative: 6 % (ref 3.0–12.0)
Neutro Abs: 7.9 10*3/uL — ABNORMAL HIGH (ref 1.4–7.7)
Neutrophils Relative %: 73.2 % (ref 43.0–77.0)
Platelets: 380 10*3/uL (ref 150.0–400.0)
RBC: 4.53 Mil/uL (ref 3.87–5.11)
RDW: 13.7 % (ref 11.5–15.5)
WBC: 10.8 10*3/uL — ABNORMAL HIGH (ref 4.0–10.5)

## 2020-01-22 LAB — HEMOGLOBIN A1C: Hgb A1c MFr Bld: 5.8 % (ref 4.6–6.5)

## 2020-01-22 MED ORDER — LISINOPRIL 5 MG PO TABS: 5.0000 mg | ORAL_TABLET | Freq: Every day | ORAL | 0 refills | Status: DC

## 2020-01-22 MED ORDER — MUPIROCIN 2 % EX OINT: TOPICAL_OINTMENT | CUTANEOUS | 0 refills | Status: DC

## 2020-01-22 NOTE — Patient Instructions (Addendum)
° ° °  BP looks great.  We will call you lab results.  Next appt May 13th for physical.

## 2020-01-22 NOTE — Progress Notes (Signed)
This visit occurred during the SARS-CoV-2 public health emergency.  Safety protocols were in place, including screening questions prior to the visit, additional usage of staff PPE, and extensive cleaning of exam room while observing appropriate contact time as indicated for disinfecting solutions.    Patient ID: Angela [name], female  DOB: July 10, 1950, 69 y.o.   MRN: [id number] Patient Care Team    Relationship Specialty Notifications Start End  Angela Leatherwood, DO PCP - General Family Medicine  04/10/17   Angela Modena, MD Consulting Physician Gastroenterology  06/19/19     Chief Complaint  Patient presents with  . Follow-up    Pt is fasting;     Subjective: Angela [name] is a 69 y.o.  Female  present for [facility] All past medical history, surgical history, allergies, family history, immunizations, medications and social history were updated in the electronic medical record today. All recent labs, ED visits and hospitalizations within the last year were reviewed.   Hypertension:  Pt reports compliance with lisinopril 5 mg . Blood pressures ranges at home. Patient denies chest pain, shortness of breath or lower extremity edema. Pt does not take a daily baby ASA. Pt is not prescribed statin. RF: Hypertension, former smoker, family history heart disease   Depression screen [facility] 2/9 01/22/2020 06/19/2019 09/12/2017 04/10/2017  Decreased Interest 0 0 0 0  Down, Depressed, Hopeless 0 0 0 0  PHQ - 2 Score 0 0 0 0   No flowsheet data found.   Immunization History  Administered Date(s) Administered  . Influenza, High Dose Seasonal PF 10/14/2016, 11/30/2017, 11/02/2018  . Influenza-Unspecified 01/08/2001, 11/13/2001, 11/20/2012, 11/08/2019  . PFIZER SARS-COV-2 Vaccination 03/17/2019, 04/11/2019, 01/19/2020  . Pneumococcal Conjugate-13 06/19/2019  . Td 02/07/1998  . Tdap 04/10/2017  . Zoster Recombinat (Shingrix) 04/10/2017, 09/12/2017    Past Medical History:  Diagnosis Date  .  Arthritis   . Blood in stool    greater than 4 years since last incidence of blood in stool   . Colon polyps   . Glaucoma   . Hyperlipidemia   . Hypertension   . Inclusion cyst 2021   face  . PNA (pneumonia) 2006  . Strain of left wrist 04/16/2019   Allergies  Allergen Reactions  . Codeine Other (See Comments)    Irritable , insomnia   . Pollen Extract    Past Surgical History:  Procedure Laterality Date  . ABSCESS DRAINAGE  2021   face- inclusion cyst  . BREAST BIOPSY Left 07/19/2018   US guided breast bx with clip placement.   Marland Kitchen EYE SURGERY  1956  . HEMORRHOID SURGERY  2001  . RADIOACTIVE SEED GUIDED EXCISIONAL BREAST BIOPSY Left 03/15/2019   Procedure: RADIOACTIVE SEED GUIDED EXCISIONAL LEFT  BREAST BIOPSY;  Surgeon: Luretha Murphy, MD;  Location:  SURGERY CENTER;  Service: General;  Laterality: Left;  . TONSILLECTOMY  1963  . WISDOM TOOTH EXTRACTION     Family History  Problem Relation Age of Onset  . Heart attack Father   . Heart attack Sister   . Hyperlipidemia Sister   . Hypertension Sister    Social History   Social History Narrative   Married. Retired. 2 children.   Bachelors degree.   Exercises routinely.   Drinks caffeine.   Smoke alarm in the home. Wears her seatbelt. Owns firearms.   Feels safe in her relationships.    Allergies as of 01/22/2020      Reactions   Codeine Other (See Comments)  Irritable , insomnia    Pollen Extract       Medication List       Accurate as of January 22, 2020  8:44 AM. If you have any questions, ask your nurse or doctor.        STOP taking these medications   doxycycline 100 MG tablet Commonly known as: VIBRA-TABS Stopped by: Felix Pacini, DO   Linzess 72 MCG capsule Generic drug: linaclotide Stopped by: Felix Pacini, DO   meloxicam 7.5 MG tablet Commonly known as: MOBIC Stopped by: Felix Pacini, DO   timolol 0.5 % ophthalmic solution Commonly known as: BETIMOL Stopped by: Felix Pacini,  DO     TAKE these medications   cholecalciferol 1000 units tablet Commonly known as: VITAMIN D Take 1,000 Units by mouth daily.   DAILY PROBIOTIC PO Take by mouth.   FIBER-CAPS PO Take by mouth.   latanoprost 0.005 % ophthalmic solution Commonly known as: XALATAN Place 1 drop into both eyes at bedtime.   lisinopril 5 MG tablet Commonly known as: ZESTRIL Take 1 tablet (5 mg total) by mouth daily.   multivitamin tablet Take 1 tablet by mouth daily.   mupirocin ointment 2 % Commonly known as: Bactroban Apply to wound twice daily.   timolol 0.5 % ophthalmic solution Commonly known as: TIMOPTIC       All past medical history, surgical history, allergies, family history, immunizations andmedications were updated in the EMR today and reviewed under the history and medication portions of their EMR.      ROS: 14 pt review of systems performed and negative (unless mentioned in an HPI)  Objective: BP 129/79   Pulse 70   Temp 98.1 F (36.7 C) (Oral)   Ht 5\' 4"  (1.626 m)   Wt 139 lb (63 kg)   SpO2 99%   BMI 23.86 kg/m  Gen: Afebrile. No acute distress. Nontoxic, pleasant female.  HENT: AT. [place].  Eyes:Pupils Equal Round Reactive to light, Extraocular movements intact,  Conjunctiva without redness, discharge or icterus. Neck/lymp/endocrine: Supple,no lymphadenopathy, no thyromegaly CV: RRR no murmur, no edema, +2/4 P posterior tibialis pulses Chest: CTAB, no wheeze or crackles Skin: no rashes, purpura or petechiae.  Neuro: Normal gait. PERLA. EOMi. Alert. Oriented x3 Psych: Normal affect, dress and demeanor. Normal speech. Normal thought content and judgment.   No exam data present  Assessment/plan: Angela [name] is a 69 y.o. female present for [facility] Essential hypertension Stable - continue lisinopril 5 mg  -Routine exercise encourage -Low-sodium heart healthy diet encouraged Follow-up 5.5 mos, sooner if blood pressures above goal.  elevated a1c: 6.0 last appt  06/2019 Dietary changes and increase exercise.   Leukocytosis:  Elevated wbc 13.9 and mild elevation in plts as well 401 06/2019.  Cbc with diff. For recheck  Return in about 5 months (around 06/19/2020) for CPE (30 min).  Orders Placed This Encounter  Procedures  . CBC w/Diff  . Hemoglobin A1c   Meds ordered this encounter  Medications  . lisinopril (ZESTRIL) 5 MG tablet    Sig: Take 1 tablet (5 mg total) by mouth daily.    Dispense:  30 tablet    Refill:  0    Needs to schedule appt for further refills  . mupirocin ointment (BACTROBAN) 2 %    Sig: Apply to wound twice daily.    Dispense:  22 g    Refill:  0   Referral Orders  No referral(s) requested today     Electronically signed  by: Howard Pouch, DO [place]

## 2020-01-24 ENCOUNTER — Other Ambulatory Visit: Payer: Self-pay | Admitting: Family Medicine

## 2020-01-24 DIAGNOSIS — N6489 Other specified disorders of breast: Secondary | ICD-10-CM

## 2020-02-05 ENCOUNTER — Telehealth: Payer: Self-pay | Admitting: Family Medicine

## 2020-02-05 NOTE — Telephone Encounter (Signed)
Spoke with patient she req a CB 02/06/20 am

## 2020-03-02 ENCOUNTER — Other Ambulatory Visit: Payer: Self-pay

## 2020-03-02 ENCOUNTER — Ambulatory Visit
Admission: RE | Admit: 2020-03-02 | Discharge: 2020-03-02 | Disposition: A | Discharge status: Home / Self Care | Payer: Medicare HMO | Source: Ambulatory Visit | Attending: Family Medicine | Admitting: Family Medicine

## 2020-03-02 ENCOUNTER — Ambulatory Visit: Payer: Medicare HMO

## 2020-03-02 DIAGNOSIS — N6489 Other specified disorders of breast: Secondary | ICD-10-CM

## 2020-03-04 ENCOUNTER — Ambulatory Visit: Payer: Medicare HMO

## 2020-03-18 ENCOUNTER — Other Ambulatory Visit: Payer: Self-pay

## 2020-03-18 ENCOUNTER — Ambulatory Visit (INDEPENDENT_AMBULATORY_CARE_PROVIDER_SITE_OTHER): Payer: Medicare HMO

## 2020-03-18 VITALS — BP 134/82 | HR 74 | Temp 98.8°F | Resp 16 | Ht 64.0 in | Wt 139.8 lb

## 2020-03-18 DIAGNOSIS — Z0000 Encounter for general adult medical examination without abnormal findings: Secondary | ICD-10-CM | POA: Diagnosis not present

## 2020-03-18 NOTE — Patient Instructions (Signed)
[name] , Thank you for taking time to come for your Medicare Wellness Visit. I appreciate your ongoing commitment to your health goals. Please review the following plan we discussed and let me know if I can assist you in the future.   Screening recommendations/referrals: Colonoscopy: Completed 07/09/2019-Due 07/08/2024 Mammogram: Completed 03/02/2020-Due 03/02/2021 Bone Density: Due-Declined today. Please let us know when you are ready to schedule. Recommended yearly ophthalmology/optometry visit for glaucoma screening and checkup Recommended yearly dental visit for hygiene and checkup  Vaccinations: Influenza vaccine: Up to date Pneumococcal vaccine: Completed vaccines Tdap vaccine: Up to date-Due-04/11/2027 Shingles vaccine: Completed vaccines   Covid-19:Completed vaccines  Advanced directives: Please bring a copy for your chart  Conditions/risks identified: See problem list  Next appointment: Follow up in one year for your annual wellness visit    Preventive Care 65 Years and Older, Female Preventive care refers to lifestyle choices and visits with your health care provider that can promote health and wellness. What does preventive care include?  A yearly physical exam. This is also called an annual well check.  Dental exams once or twice a year.  Routine eye exams. Ask your health care provider how often you should have your eyes checked.  Personal lifestyle choices, including:  Daily care of your teeth and gums.  Regular physical activity.  Eating a healthy diet.  Avoiding tobacco and drug use.  Limiting alcohol use.  Practicing safe sex.  Taking low-dose aspirin every day.  Taking vitamin and mineral supplements as recommended by your health care provider. What happens during an annual well check? The services and screenings done by your health care provider during your annual well check will depend on your age, overall health, lifestyle risk factors, and family  history of disease. Counseling  Your health care provider may ask you questions about your:  Alcohol use.  Tobacco use.  Drug use.  Emotional well-being.  Home and relationship well-being.  Sexual activity.  Eating habits.  History of falls.  Memory and ability to understand (cognition).  Work and work Astronomer.  Reproductive health. Screening  You may have the following tests or measurements:  Height, weight, and BMI.  Blood pressure.  Lipid and cholesterol levels. These may be checked every 5 years, or more frequently if you are over 74 years old.  Skin check.  Lung cancer screening. You may have this screening every year starting at age 45 if you have a 30-pack-year history of smoking and currently smoke or have quit within the past 15 years.  Fecal occult blood test (FOBT) of the stool. You may have this test every year starting at age 64.  Flexible sigmoidoscopy or colonoscopy. You may have a sigmoidoscopy every 5 years or a colonoscopy every 10 years starting at age 39.  Hepatitis C blood test.  Hepatitis B blood test.  Sexually transmitted disease (STD) testing.  Diabetes screening. This is done by checking your blood sugar (glucose) after you have not eaten for a while (fasting). You may have this done every 1-3 years.  Bone density scan. This is done to screen for osteoporosis. You may have this done starting at age 61.  Mammogram. This may be done every 1-2 years. Talk to your health care provider about how often you should have regular mammograms. Talk with your health care provider about your test results, treatment options, and if necessary, the need for more tests. Vaccines  Your health care provider may recommend certain vaccines, such as:  Influenza  vaccine. This is recommended every year.  Tetanus, diphtheria, and acellular pertussis (Tdap, Td) vaccine. You may need a Td booster every 10 years.  Zoster vaccine. You may need this after  age 50.  Pneumococcal 13-valent conjugate (PCV13) vaccine. One dose is recommended after age 36.  Pneumococcal polysaccharide (PPSV23) vaccine. One dose is recommended after age 87. Talk to your health care provider about which screenings and vaccines you need and how often you need them. This information is not intended to replace advice given to you by your health care provider. Make sure you discuss any questions you have with your health care provider. Document Released: 02/20/2015 Document Revised: 10/14/2015 Document Reviewed: 11/25/2014 Elsevier Interactive Patient Education  2017 [place] Prevention in the Home Falls can cause injuries. They can happen to people of all ages. There are many things you can do to make your home safe and to help prevent falls. What can I do on the outside of my home?  Regularly fix the edges of walkways and driveways and fix any cracks.  Remove anything that might make you trip as you walk through a door, such as a raised step or threshold.  Trim any bushes or trees on the path to your home.  Use bright outdoor lighting.  Clear any walking paths of anything that might make someone trip, such as rocks or tools.  Regularly check to see if handrails are loose or broken. Make sure that both sides of any steps have handrails.  Any raised decks and porches should have guardrails on the edges.  Have any leaves, snow, or ice cleared regularly.  Use sand or salt on walking paths during winter.  Clean up any spills in your garage right away. This includes oil or grease spills. What can I do in the bathroom?  Use night lights.  Install grab bars by the toilet and in the tub and shower. Do not use towel bars as grab bars.  Use non-skid mats or decals in the tub or shower.  If you need to sit down in the shower, use a plastic, non-slip stool.  Keep the floor dry. Clean up any water that spills on the floor as soon as it  happens.  Remove soap buildup in the tub or shower regularly.  Attach bath mats securely with double-sided non-slip rug tape.  Do not have throw rugs and other things on the floor that can make you trip. What can I do in the bedroom?  Use night lights.  Make sure that you have a light by your bed that is easy to reach.  Do not use any sheets or blankets that are too big for your bed. They should not hang down onto the floor.  Have a firm chair that has side arms. You can use this for support while you get dressed.  Do not have throw rugs and other things on the floor that can make you trip. What can I do in the kitchen?  Clean up any spills right away.  Avoid walking on wet floors.  Keep items that you use a lot in easy-to-reach places.  If you need to reach something above you, use a strong step stool that has a grab bar.  Keep electrical cords out of the way.  Do not use floor polish or wax that makes floors slippery. If you must use wax, use non-skid floor wax.  Do not have throw rugs and other things on the floor that can  make you trip. What can I do with my stairs?  Do not leave any items on the stairs.  Make sure that there are handrails on both sides of the stairs and use them. Fix handrails that are broken or loose. Make sure that handrails are as long as the stairways.  Check any carpeting to make sure that it is firmly attached to the stairs. Fix any carpet that is loose or worn.  Avoid having throw rugs at the top or bottom of the stairs. If you do have throw rugs, attach them to the floor with carpet tape.  Make sure that you have a light switch at the top of the stairs and the bottom of the stairs. If you do not have them, ask someone to add them for you. What else can I do to help prevent falls?  Wear shoes that:  Do not have high heels.  Have rubber bottoms.  Are comfortable and fit you well.  Are closed at the toe. Do not wear sandals.  If you  use a stepladder:  Make sure that it is fully opened. Do not climb a closed stepladder.  Make sure that both sides of the stepladder are locked into place.  Ask someone to hold it for you, if possible.  Clearly mark and make sure that you can see:  Any grab bars or handrails.  First and last steps.  Where the edge of each step is.  Use tools that help you move around (mobility aids) if they are needed. These include:  Canes.  Walkers.  Scooters.  Crutches.  Turn on the lights when you go into a dark area. Replace any light bulbs as soon as they burn out.  Set up your furniture so you have a clear path. Avoid moving your furniture around.  If any of your floors are uneven, fix them.  If there are any pets around you, be aware of where they are.  Review your medicines with your doctor. Some medicines can make you feel dizzy. This can increase your chance of falling. Ask your doctor what other things that you can do to help prevent falls. This information is not intended to replace advice given to you by your health care provider. Make sure you discuss any questions you have with your health care provider. Document Released: 11/20/2008 Document Revised: 07/02/2015 Document Reviewed: 02/28/2014 Elsevier Interactive Patient Education  2017 [employer].

## 2020-03-18 NOTE — Progress Notes (Signed)
Subjective:   CAROLEEN STOERMER is a 70 y.o. female who presents for an Initial Medicare Annual Wellness Visit.  Review of Systems     Cardiac Risk Factors include: advanced age (>39men, >32 women);hypertension;sedentary lifestyle     Objective:    Today's Vitals   03/18/20 0807  BP: 134/82  Pulse: 74  Resp: 16  Temp: 98.8 F (37.1 C)  TempSrc: Oral  SpO2: 96%  Weight: 139 lb 12.8 oz (63.4 kg)  Height: 5\' 4"  (1.626 m)   Body mass index is 24 kg/m.  Advanced Directives 03/18/2020 03/15/2019  Does Patient Have a Medical Advance Directive? Yes Yes  Type of 05/13/2019 of [place];Living will Healthcare Power of [place];Living will  Copy of Healthcare Power of Attorney in Chart? Yes - validated most recent copy scanned in chart (See row information) No - copy requested    Current Medications (verified) Outpatient Encounter Medications as of 03/18/2020  Medication Sig  . Calcium Polycarbophil (FIBER-CAPS PO) Take by mouth.  . cholecalciferol (VITAMIN D) 1000 units tablet Take 1,000 Units by mouth daily.  05/16/2020 latanoprost (XALATAN) 0.005 % ophthalmic solution Place 1 drop into both eyes at bedtime.  Marland Kitchen lisinopril (ZESTRIL) 5 MG tablet Take 1 tablet (5 mg total) by mouth daily.  . Multiple Vitamin (MULTIVITAMIN) tablet Take 1 tablet by mouth daily.  . mupirocin ointment (BACTROBAN) 2 % Apply to wound twice daily.  . Probiotic Product (DAILY PROBIOTIC PO) Take by mouth.  . timolol (TIMOPTIC) 0.5 % ophthalmic solution    No facility-administered encounter medications on file as of 03/18/2020.    Allergies (verified) Codeine and Pollen extract   History: Past Medical History:  Diagnosis Date  . Arthritis   . Blood in stool    greater than 4 years since last incidence of blood in stool   . Colon polyps   . Glaucoma   . Hyperlipidemia   . Hypertension   . Inclusion cyst 2021   face  . PNA (pneumonia) 2006  . Strain of left wrist 04/16/2019   Past  Surgical History:  Procedure Laterality Date  . ABSCESS DRAINAGE  2021   face- inclusion cyst  . BREAST BIOPSY Left 07/19/2018   09/18/2018 guided breast bx with clip placement- benign  . EYE SURGERY  1956  . HEMORRHOID SURGERY  2001  . RADIOACTIVE SEED GUIDED EXCISIONAL BREAST BIOPSY Left 03/15/2019   Procedure: RADIOACTIVE SEED GUIDED EXCISIONAL LEFT  BREAST BIOPSY;  Surgeon: 05/13/2019, MD;  Location: [facility];  Service: General;  Laterality: Left;  . TONSILLECTOMY  1963  . WISDOM TOOTH EXTRACTION     Family History  Problem Relation Age of Onset  . Heart attack Father   . Heart attack Sister   . Hyperlipidemia Sister   . Hypertension Sister    Social History   Socioeconomic History  . Marital status: Married    Spouse name: Not on file  . Number of children: Not on file  . Years of education: Not on file  . Highest education level: Not on file  Occupational History  . Not on file  Tobacco Use  . Smoking status: Current Some Day Smoker    Packs/day: 1.00    Years: 15.00    Pack years: 15.00    Types: Cigars  . Smokeless tobacco: Never Used  . Tobacco comment: *occasional smoker   Vaping Use  . Vaping Use: Former  Substance and Sexual Activity  . Alcohol use:  Yes    Alcohol/week: 4.0 standard drinks    Types: 4 Glasses of wine per week    Comment: *rarely   . Drug use: No  . Sexual activity: Yes    Partners: Male    Comment: Married  Other Topics Concern  . Not on file  Social History Narrative   Married. Retired. 2 children.   Bachelors degree.   Exercises routinely.   Drinks caffeine.   Smoke alarm in the home. Wears her seatbelt. Owns firearms.   Feels safe in her relationships.   Social Determinants of Health   Financial Resource Strain: Low Risk   . Difficulty of Paying Living Expenses: Not hard at all  Food Insecurity: No Food Insecurity  . Worried About Programme researcher, broadcasting/film/video in the Last Year: Never true  . Ran Out of Food in the Last  Year: Never true  Transportation Needs: No Transportation Needs  . Lack of Transportation (Medical): No  . Lack of Transportation (Non-Medical): No  Physical Activity: Inactive  . Days of Exercise per Week: 0 days  . Minutes of Exercise per Session: 0 min  Stress: No Stress Concern Present  . Feeling of Stress : Not at all  Social Connections: Moderately Isolated  . Frequency of Communication with Friends and Family: More than three times a week  . Frequency of Social Gatherings with Friends and Family: More than three times a week  . Attends Religious Services: Never  . Active Member of Clubs or Organizations: No  . Attends Banker Meetings: Never  . Marital Status: Married    Tobacco Counseling Ready to quit: Not Answered Counseling given: Not Answered Comment: *occasional smoker    Clinical Intake:  Pre-visit preparation completed: Yes  Pain : No/denies pain     Nutritional Status: BMI of 19-24  Normal Nutritional Risks: None Diabetes: No  How often do you need to have someone help you when you read instructions, pamphlets, or other written materials from your doctor or pharmacy?: 1 - Never  Diabetic?No  Interpreter Needed?: No  Information entered by :: Thomasenia Sales LPN   Activities of Daily Living In your present state of health, do you have any difficulty performing the following activities: 03/18/2020  Hearing? N  Vision? N  Difficulty concentrating or making decisions? N  Walking or climbing stairs? N  Dressing or bathing? N  Doing errands, shopping? N  Preparing Food and eating ? N  Using the Toilet? N  In the past six months, have you accidently leaked urine? N  Do you have problems with loss of bowel control? N  Managing your Medications? N  Managing your Finances? N  Housekeeping or managing your Housekeeping? N  Some recent data might be hidden    Patient Care Team: Natalia Leatherwood, DO as PCP - General (Family  Medicine) Willis Modena, MD as Consulting Physician (Gastroenterology)  Indicate any recent Medical Services you may have received from other than [facility] providers in the past year (date may be approximate).     Assessment:   This is a routine wellness examination for Takasha.  Hearing/Vision screen  Hearing Screening   125Hz  250Hz  500Hz  1000Hz  2000Hz  3000Hz  4000Hz  6000Hz  8000Hz   Right ear:           Left ear:           Comments: No issues  Vision Screening Comments: Wears glasses Last eye exam-05/2019-[facility]  Dietary issues and exercise activities discussed: Current Exercise Habits:  The patient does not participate in regular exercise at present, Exercise limited by: None identified  Goals    . Patient Stated     Eat healthier, drink more water & increase activity      Depression Screen PHQ 2/9 Scores 03/18/2020 01/22/2020 06/19/2019 09/12/2017 04/10/2017  PHQ - 2 Score 0 0 0 0 0    Fall Risk Fall Risk  03/18/2020 01/22/2020 06/19/2019 09/12/2017 04/10/2017  Falls in the past year? 0 0 0 No No  Number falls in past yr: 0 0 - - -  Injury with Fall? 0 0 - - -  Follow up Falls prevention discussed Falls evaluation completed Falls evaluation completed;Education provided;Falls prevention discussed - -    FALL RISK PREVENTION PERTAINING TO THE HOME:  Any stairs in or around the home? Yes  If so, are there any without handrails? No  Home free of loose throw rugs in walkways, pet beds, electrical cords, etc? Yes  Adequate lighting in your home to reduce risk of falls? Yes   ASSISTIVE DEVICES UTILIZED TO PREVENT FALLS:  Life alert? No  Use of a cane, walker or w/c? No  Grab bars in the bathroom? Yes  Shower chair or bench in shower? No  Elevated toilet seat or a handicapped toilet? No   TIMED UP AND GO:  Was the test performed? Yes .  Length of time to ambulate 10 feet: 9 sec.   Gait steady and fast without use of assistive device  Cognitive Function:Normal cognitive status  assessed by direct observation by this Nurse Health Advisor. No abnormalities found.          Immunizations Immunization History  Administered Date(s) Administered  . Influenza, High Dose Seasonal PF 10/14/2016, 11/30/2017, 11/02/2018  . Influenza-Unspecified 01/08/2001, 11/13/2001, 11/20/2012, 11/08/2019  . PFIZER(Purple Top)SARS-COV-2 Vaccination 03/17/2019, 04/11/2019, 01/19/2020  . Pneumococcal Conjugate-13 06/19/2019  . Td 02/07/1998  . Tdap 04/10/2017  . Zoster Recombinat (Shingrix) 04/10/2017, 09/12/2017    TDAP status: Up to date  Flu Vaccine status: Up to date  Pneumococcal vaccine status: Up to date  Covid-19 vaccine status: Completed vaccines  Qualifies for Shingles Vaccine? No   Zostavax completed No   Shingrix Completed?: Yes  Screening Tests Health Maintenance  Topic Date Due  . PNA vac Low Risk Adult (2 of 2 - PPSV23) 06/18/2020  . MAMMOGRAM  03/02/2022  . COLONOSCOPY (Pts 45-66yrs Insurance coverage will need to be confirmed)  07/08/2024  . TETANUS/TDAP  04/11/2027  . DEXA SCAN  05/12/2027  . INFLUENZA VACCINE  Completed  . COVID-19 Vaccine  Completed  . Hepatitis C Screening  Completed    Health Maintenance  There are no preventive care reminders to display for this patient.  Colorectal cancer screening: Type of screening: Colonoscopy. Completed 07/09/2019. Repeat every 5 years  Mammogram status: Completed Bilateral 03/02/2020. Repeat every year   Bone Density status:Due-Declined today. Patient states she will consider scheduling next year with her mammogram.  Lung Cancer Screening: (Low Dose CT Chest recommended if Age 88-80 years, 30 pack-year currently smoking OR have quit w/in 15years.) does not qualify.    Additional Screening:  Hepatitis C Screening:Completed 04/21/2017  Vision Screening: Recommended annual ophthalmology exams for early detection of glaucoma and other disorders of the eye. Is the patient up to date with their annual eye  exam?  Yes  Who is the provider or what is the name of the office in which the patient attends annual eye exams? [facility]   Dental Screening:  Recommended annual dental exams for proper oral hygiene  Community Resource Referral / Chronic Care Management: CRR required this visit?  No   CCM required this visit?  No      Plan:     I have personally reviewed and noted the following in the patient's chart:   . Medical and social history . Use of alcohol, tobacco or illicit drugs  . Current medications and supplements . Functional ability and status . Nutritional status . Physical activity . Advanced directives . List of other physicians . Hospitalizations, surgeries, and ER visits in previous 12 months . Vitals . Screenings to include cognitive, depression, and falls . Referrals and appointments  In addition, I have reviewed and discussed with patient certain preventive protocols, quality metrics, and best practice recommendations. A written personalized care plan for preventive services as well as general preventive health recommendations were provided to patient.   Patient would like to access avs on mychart.  Roanna Raider, LPN   05/10/8766  Nurse Health Advisor  Nurse Notes: None

## 2020-06-25 DIAGNOSIS — K5904 Chronic idiopathic constipation: Secondary | ICD-10-CM | POA: Insufficient documentation

## 2020-06-25 DIAGNOSIS — E7800 Pure hypercholesterolemia, unspecified: Secondary | ICD-10-CM | POA: Insufficient documentation

## 2020-06-25 DIAGNOSIS — K5909 Other constipation: Secondary | ICD-10-CM | POA: Insufficient documentation

## 2020-06-25 DIAGNOSIS — E785 Hyperlipidemia, unspecified: Secondary | ICD-10-CM | POA: Insufficient documentation

## 2020-06-25 DIAGNOSIS — K921 Melena: Secondary | ICD-10-CM | POA: Insufficient documentation

## 2020-06-25 DIAGNOSIS — R1030 Lower abdominal pain, unspecified: Secondary | ICD-10-CM | POA: Insufficient documentation

## 2020-06-26 ENCOUNTER — Encounter: Payer: Self-pay | Admitting: Family Medicine

## 2020-06-26 ENCOUNTER — Other Ambulatory Visit: Payer: Self-pay

## 2020-06-26 ENCOUNTER — Ambulatory Visit (INDEPENDENT_AMBULATORY_CARE_PROVIDER_SITE_OTHER): Payer: Medicare HMO | Admitting: Family Medicine

## 2020-06-26 VITALS — BP 111/69 | HR 87 | Temp 98.7°F | Ht 64.0 in | Wt 134.0 lb

## 2020-06-26 DIAGNOSIS — K5790 Diverticulosis of intestine, part unspecified, without perforation or abscess without bleeding: Secondary | ICD-10-CM | POA: Diagnosis not present

## 2020-06-26 DIAGNOSIS — R1030 Lower abdominal pain, unspecified: Secondary | ICD-10-CM | POA: Diagnosis not present

## 2020-06-26 MED ORDER — AMOXICILLIN-POT CLAVULANATE 875-125 MG PO TABS: 1.0000 | ORAL_TABLET | Freq: Two times a day (BID) | ORAL | 0 refills | Status: DC

## 2020-06-26 NOTE — Progress Notes (Signed)
This visit occurred during the SARS-CoV-2 public health emergency.  Safety protocols were in place, including screening questions prior to the visit, additional usage of staff PPE, and extensive cleaning of exam room while observing appropriate contact time as indicated for disinfecting solutions.    Angela [name] , 1950/07/26, 70 y.o., female MRN: [id number] Patient Care Team    Relationship Specialty Notifications Start End  Ma Hillock, DO PCP - General Family Medicine  04/10/17   Arta Silence, MD Consulting Physician Gastroenterology  06/19/19     Chief Complaint  Patient presents with  . Abdominal Pain    Pt c/o dull pain in b/l iliac region x 2 mos intermittently when eat and when have BM; PMHx of chronic constipation, diverticulosis.      Subjective: Pt presents for an OV with complaints of lower abd pain of 2 months  duration.  Associated symptoms include pressure pain discomfort sometimes after eating and when having bowel movements.  She had her colonoscopy 07/2019 > dx diverticulosis throughout colon and 1 polyp in ascending colon. She denies fever, melena, dizziness or fatigue.   Depression screen [facility] 2/9 03/18/2020 01/22/2020 06/19/2019 09/12/2017 04/10/2017  Decreased Interest 0 0 0 0 0  Down, Depressed, Hopeless 0 0 0 0 0  PHQ - 2 Score 0 0 0 0 0    Allergies  Allergen Reactions  . Codeine Other (See Comments)    Irritable , insomnia   . Pollen Extract    Social History   Social History Narrative   Married. Retired. 2 children.   Bachelors degree.   Exercises routinely.   Drinks caffeine.   Smoke alarm in the home. Wears her seatbelt. Owns firearms.   Feels safe in her relationships.   Past Medical History:  Diagnosis Date  . Arthritis   . Blood in stool    greater than 4 years since last incidence of blood in stool   . Colon polyps   . Glaucoma   . Hyperlipidemia   . Hypertension   . Inclusion cyst 2021   face  . PNA (pneumonia) 2006  . Strain  of left wrist 04/16/2019   Past Surgical History:  Procedure Laterality Date  . ABSCESS DRAINAGE  2021   face- inclusion cyst  . BREAST BIOPSY Left 07/19/2018   US guided breast bx with clip placement- benign  . EYE SURGERY  1956  . [place]  2001  . RADIOACTIVE SEED GUIDED EXCISIONAL BREAST BIOPSY Left 03/15/2019   Procedure: RADIOACTIVE SEED GUIDED EXCISIONAL LEFT  BREAST BIOPSY;  Surgeon: Johnathan Hausen, MD;  Location: [facility];  Service: General;  Laterality: Left;  . TONSILLECTOMY  1963  . WISDOM TOOTH EXTRACTION     Family History  Problem Relation Age of Onset  . Heart attack Father   . Heart attack Sister   . Hyperlipidemia Sister   . Hypertension Sister    Allergies as of 06/26/2020      Reactions   Codeine Other (See Comments)   Irritable , insomnia    Pollen Extract       Medication List       Accurate as of Jun 26, 2020  3:30 PM. If you have any questions, ask your nurse or doctor.        STOP taking these medications   mupirocin ointment 2 % Commonly known as: Bactroban Stopped by: Howard Pouch, DO     TAKE these medications   amoxicillin-clavulanate [id number]  MG tablet Commonly known as: AUGMENTIN Take 1 tablet by mouth 2 (two) times daily. Started by: Howard Pouch, DO   cholecalciferol 1000 units tablet Commonly known as: VITAMIN D Take 1,000 Units by mouth daily.   DAILY PROBIOTIC PO Take by mouth.   FIBER-CAPS PO Take by mouth.   latanoprost 0.005 % ophthalmic solution Commonly known as: XALATAN Place 1 drop into both eyes at bedtime.   lisinopril 5 MG tablet Commonly known as: ZESTRIL Take 1 tablet (5 mg total) by mouth daily.   multivitamin tablet Take 1 tablet by mouth daily.   timolol 0.5 % ophthalmic solution Commonly known as: TIMOPTIC       All past medical history, surgical history, allergies, family history, immunizations andmedications were updated in the EMR today and reviewed under the history  and medication portions of their EMR.     ROS: Negative, with the exception of above mentioned in HPI   Objective:  BP 111/69   Pulse 87   Temp 98.7 F (37.1 C) (Oral)   Ht 5' 4" (1.626 m)   Wt 134 lb (60.8 kg)   SpO2 97%   BMI 23.00 kg/m  Body mass index is 23 kg/m. Gen: Afebrile. No acute distress. Nontoxic in appearance, well developed, well nourished.  HENT: AT. [place].  Eyes:Pupils Equal Round Reactive to light, Extraocular movements intact,  Conjunctiva without redness, discharge or icterus. Neck/lymp/endocrine: Supple, No lymphadenopathy Abd: Soft. Round. mild distention and TTP deep above suprapubic bone. no Masses palpated. No rebound or guarding. * Skin: no rashes, purpura or petechiae.  Neuro: Normal gait. PERLA. EOMi. Alert. Oriented x3  Psych: Normal affect, dress and demeanor. Normal speech. Normal thought content and judgment.  No exam data present No results found. No results found for this or any previous visit (from the past 24 hour(s)).  Assessment/Plan: [name] is a 70 y.o. female present for OV for  Lower abdominal pain/diverticulosis H/o extensive diverticulosis- no fever or chills. ? Colitis. Labs today to rule out infection vs inflammatory process.   - Comp Met (CMET) - C-reactive protein - CBC w/Diff - elected to start Augmentin BID.  - encouraged senakot nightly - avoid constipation.  - if no improvement or labs indicate need, would encourage follow up in 2 weeks and consider image studies as well.    Reviewed expectations re: course of current medical issues.  Discussed self-management of symptoms.  Outlined signs and symptoms indicating need for more acute intervention.  Patient verbalized understanding and all questions were answered.  Patient received an After-Visit Summary.    Orders Placed This Encounter  Procedures  . Comp Met (CMET)  . C-reactive protein  . CBC w/Diff   Meds ordered this encounter  Medications  .  amoxicillin-clavulanate (AUGMENTIN) 875-125 MG tablet    Sig: Take 1 tablet by mouth 2 (two) times daily.    Dispense:  20 tablet    Refill:  0   Referral Orders  No referral(s) requested today     Note is dictated utilizing voice recognition software. Although note has been proof read prior to signing, occasional typographical errors still can be missed. If any questions arise, please do not hesitate to call for verification.   electronically signed by:  Howard Pouch, DO  [place]

## 2020-06-26 NOTE — Patient Instructions (Signed)
Diverticulitis  Diverticulitis is when small pouches in your colon (large intestine) get infected or swollen. This causes pain in the belly (abdomen) and watery poop (diarrhea). These pouches are called diverticula. The pouches form in people who have a condition called diverticulosis. What are the causes? This condition may be caused by poop (stool) that gets trapped in the pouches in your colon. The poop lets germs (bacteria) grow in the pouches. This causes the infection. What increases the risk? You are more likely to get this condition if you have small pouches in your colon. The risk is higher if:  You are overweight or very overweight (obese).  You do not exercise enough.  You drink alcohol.  You smoke or use products with tobacco in them.  You eat a diet that has a lot of red meat such as beef, pork, or lamb.  You eat a diet that does not have enough fiber in it.  You are older than 68 years of age. What are the signs or symptoms?  Pain in the belly. Pain is often on the left side, but it may be in other areas.  Fever and feeling cold.  Feeling like you may vomit.  Vomiting.  Having cramps.  Feeling full.  Changes to how often you poop.  Blood in your poop. How is this treated? Most cases are treated at home by:  Taking over-the-counter pain medicines.  Following a clear liquid diet.  Taking antibiotic medicines.  Resting. Very bad cases may need to be treated at a hospital. This may include:  Not eating or drinking.  Taking prescription pain medicine.  Getting antibiotic medicines through an IV tube.  Getting fluid and food through an IV tube.  Having surgery. When you are feeling better, your doctor may tell you to have a test to check your colon (colonoscopy). Follow these instructions at home: Medicines  Take over-the-counter and prescription medicines only as told by your doctor. These include: ? Antibiotics. ? Pain medicines. ? Fiber  pills. ? Probiotics. ? Stool softeners.  If you were prescribed an antibiotic medicine, take it as told by your doctor. Do not stop taking the antibiotic even if you start to feel better.  Ask your doctor if the medicine prescribed to you requires you to avoid driving or using machinery. Eating and drinking  Follow a diet as told by your doctor.  When you feel better, your doctor may tell you to change your diet. You may need to eat a lot of fiber. Fiber makes it easier to poop (have a bowel movement). Foods with fiber include: ? Berries. ? Beans. ? Lentils. ? Green vegetables.  Avoid eating red meat.   General instructions  Do not use any products that contain nicotine or tobacco, such as cigarettes, e-cigarettes, and chewing tobacco. If you need help quitting, ask your doctor.  Exercise 3 or more times a week. Try to get 30 minutes each time. Exercise enough to sweat and make your heart beat faster.  Keep all follow-up visits as told by your doctor. This is important. Contact a doctor if:  Your pain does not get better.  You are not pooping like normal. Get help right away if:  Your pain gets worse.  Your symptoms do not get better.  Your symptoms get worse very fast.  You have a fever.  You vomit more than one time.  You have poop that is: ? Bloody. ? Black. ? Tarry. Summary  This condition happens when   small pouches in your colon get infected or swollen.  Take medicines only as told by your doctor.  Follow a diet as told by your doctor.  Keep all follow-up visits as told by your doctor. This is important. This information is not intended to replace advice given to you by your health care provider. Make sure you discuss any questions you have with your health care provider. Document Revised: 11/05/2018 Document Reviewed: 11/05/2018 Elsevier Patient Education  2021 [employer]  

## 2020-06-27 LAB — CBC WITH DIFFERENTIAL/PLATELET
Absolute Monocytes: 700 cells/uL (ref 200–950)
Basophils Absolute: 75 cells/uL (ref 0–200)
Basophils Relative: 0.6 %
Eosinophils Absolute: 275 cells/uL (ref 15–500)
Eosinophils Relative: 2.2 %
HCT: 39 % (ref 35.0–45.0)
Hemoglobin: 13.1 g/dL (ref 11.7–15.5)
Lymphs Abs: 3138 cells/uL (ref 850–3900)
MCH: 30 pg (ref 27.0–33.0)
MCHC: 33.6 g/dL (ref 32.0–36.0)
MCV: 89.2 fL (ref 80.0–100.0)
MPV: 8.4 fL (ref 7.5–12.5)
Monocytes Relative: 5.6 %
Neutro Abs: 8313 cells/uL — ABNORMAL HIGH (ref 1500–7800)
Neutrophils Relative %: 66.5 %
Platelets: 417 10*3/uL — ABNORMAL HIGH (ref 140–400)
RBC: 4.37 10*6/uL (ref 3.80–5.10)
RDW: 13 % (ref 11.0–15.0)
Total Lymphocyte: 25.1 %
WBC: 12.5 10*3/uL — ABNORMAL HIGH (ref 3.8–10.8)

## 2020-06-27 LAB — COMPREHENSIVE METABOLIC PANEL
AG Ratio: 1.7 (calc) (ref 1.0–2.5)
ALT: 10 U/L (ref 6–29)
AST: 14 U/L (ref 10–35)
Albumin: 4 g/dL (ref 3.6–5.1)
Alkaline phosphatase (APISO): 74 U/L (ref 37–153)
BUN: 10 mg/dL (ref 7–25)
CO2: 25 mmol/L (ref 20–32)
Calcium: 9.1 mg/dL (ref 8.6–10.4)
Chloride: 103 mmol/L (ref 98–110)
Creat: 0.74 mg/dL (ref 0.50–0.99)
Globulin: 2.3 g/dL (calc) (ref 1.9–3.7)
Glucose, Bld: 91 mg/dL (ref 65–99)
Potassium: 3.9 mmol/L (ref 3.5–5.3)
Sodium: 140 mmol/L (ref 135–146)
Total Bilirubin: 0.3 mg/dL (ref 0.2–1.2)
Total Protein: 6.3 g/dL (ref 6.1–8.1)

## 2020-06-27 LAB — C-REACTIVE PROTEIN: CRP: 7.3 mg/L (ref <8.0)

## 2020-07-29 ENCOUNTER — Other Ambulatory Visit: Payer: Self-pay

## 2020-07-29 ENCOUNTER — Encounter: Payer: Self-pay | Admitting: Family Medicine

## 2020-07-29 ENCOUNTER — Ambulatory Visit (INDEPENDENT_AMBULATORY_CARE_PROVIDER_SITE_OTHER): Payer: Medicare HMO | Admitting: Family Medicine

## 2020-07-29 VITALS — BP 107/68 | HR 78 | Temp 99.1°F | Wt 133.0 lb

## 2020-07-29 DIAGNOSIS — R1030 Lower abdominal pain, unspecified: Secondary | ICD-10-CM | POA: Diagnosis not present

## 2020-07-29 DIAGNOSIS — K644 Residual hemorrhoidal skin tags: Secondary | ICD-10-CM

## 2020-07-29 DIAGNOSIS — R1032 Left lower quadrant pain: Secondary | ICD-10-CM | POA: Diagnosis not present

## 2020-07-29 DIAGNOSIS — K5792 Diverticulitis of intestine, part unspecified, without perforation or abscess without bleeding: Secondary | ICD-10-CM

## 2020-07-29 MED ORDER — AMOXICILLIN-POT CLAVULANATE 875-125 MG PO TABS: 1.0000 | ORAL_TABLET | Freq: Two times a day (BID) | ORAL | 0 refills | Status: DC

## 2020-07-29 MED ORDER — HYDROCORTISONE (PERIANAL) 2.5 % EX CREA: 1.0000 "application " | TOPICAL_CREAM | Freq: Two times a day (BID) | CUTANEOUS | 0 refills | Status: DC

## 2020-07-29 NOTE — Progress Notes (Signed)
This visit occurred during the SARS-CoV-2 public health emergency.  Safety protocols were in place, including screening questions prior to the visit, additional usage of staff PPE, and extensive cleaning of exam room while observing appropriate contact time as indicated for disinfecting solutions.    Angela [name] , [birth year], 70 y.o., female MRN: [id number] Patient Care Team    Relationship Specialty Notifications Start End  Natalia Leatherwood, DO PCP - General Family Medicine  04/10/17   Willis Modena, MD Consulting Physician Gastroenterology  06/19/19     Chief Complaint  Patient presents with   Diverticulitis    Pt c/o lower abdominal pain with hemorrhoids gradually worsening  x 1 week     Subjective: Pt presents for an OV with complaints of recurrent lower abd pain . Patient had been seen 4 weeks ago for left lower quadrant pain which had been present at that time for little over 2 months duration.  She has a history of rather significant diverticulitis and thus was treated as a diverticular flare with Augmentin twice daily x10 days.  She reports she had about 10-14 days of complete resolution of symptoms and then her symptoms started to resurface.  She presents today with recurrent left lower quadrant pain.  She denies fever, bowel habit changes or melena.  She states she does have a current hemorrhoid that is not responding to the usual Preparation H.  She had her colonoscopy 07/2019 > dx diverticulosis throughout colon and 1 polyp in ascending colon.   Depression screen [facility] 2/9 03/18/2020 01/22/2020 06/19/2019 09/12/2017 04/10/2017  Decreased Interest 0 0 0 0 0  Down, Depressed, Hopeless 0 0 0 0 0  PHQ - 2 Score 0 0 0 0 0    Allergies  Allergen Reactions   Codeine Other (See Comments)    Irritable , insomnia    Pollen Extract    Social History   Social History Narrative   Married. Retired. 2 children.   Bachelors degree.   Exercises routinely.   Drinks caffeine.   Smoke  alarm in the home. Wears her seatbelt. Owns firearms.   Feels safe in her relationships.   Past Medical History:  Diagnosis Date   Arthritis    Blood in stool    greater than 4 years since last incidence of blood in stool    Colon polyps    Glaucoma    Hyperlipidemia    Hypertension    Inclusion cyst 2021   face   PNA (pneumonia) 2006   Strain of left wrist 04/16/2019   Past Surgical History:  Procedure Laterality Date   ABSCESS DRAINAGE  2021   face- inclusion cyst   BREAST BIOPSY Left 07/19/2018   US guided breast bx with clip placement- benign   EYE SURGERY  1956   HEMORRHOID SURGERY  2001   RADIOACTIVE SEED GUIDED EXCISIONAL BREAST BIOPSY Left 03/15/2019   Procedure: RADIOACTIVE SEED GUIDED EXCISIONAL LEFT  BREAST BIOPSY;  Surgeon: Luretha Murphy, MD;  Location: [facility];  Service: General;  Laterality: Left;   TONSILLECTOMY  1963   WISDOM TOOTH EXTRACTION     Family History  Problem Relation Age of Onset   Heart attack Father    Heart attack Sister    Hyperlipidemia Sister    Hypertension Sister    Allergies as of 07/29/2020       Reactions   Codeine Other (See Comments)   Irritable , insomnia    Pollen Extract  Medication List        Accurate as of July 29, 2020  6:11 PM. If you have any questions, ask your nurse or doctor.          amoxicillin-clavulanate 875-125 MG tablet Commonly known as: AUGMENTIN Take 1 tablet by mouth 2 (two) times daily.   cholecalciferol 1000 units tablet Commonly known as: VITAMIN D Take 1,000 Units by mouth daily.   DAILY PROBIOTIC PO Take by mouth.   FIBER-CAPS PO Take by mouth.   hydrocortisone 2.5 % rectal cream Commonly known as: Anusol-HC Place 1 application rectally 2 (two) times daily. Started by: Felix Pacini, DO   latanoprost 0.005 % ophthalmic solution Commonly known as: XALATAN Place 1 drop into both eyes at bedtime.   lisinopril 5 MG tablet Commonly known as:  ZESTRIL Take 1 tablet (5 mg total) by mouth daily.   multivitamin tablet Take 1 tablet by mouth daily.   timolol 0.5 % ophthalmic solution Commonly known as: TIMOPTIC        All past medical history, surgical history, allergies, family history, immunizations andmedications were updated in the EMR today and reviewed under the history and medication portions of their EMR.     ROS: Negative, with the exception of above mentioned in HPI   Objective:  BP 107/68   Pulse 78   Temp 99.1 F (37.3 C) (Oral)   Wt 133 lb (60.3 kg)   SpO2 99%   BMI 22.83 kg/m  Body mass index is 22.83 kg/m. Gen: Afebrile. No acute distress.  Nontoxic in presentation.  Pleasant female HENT: AT. [place].  Eyes:Pupils Equal Round Reactive to light, Extraocular movements intact,  Conjunctiva without redness, discharge or icterus. CV: RRR  Chest: CTAB, no wheeze or crackles Abd: Soft. Flat. ND.  Moderately tender TTP left lower quadrant and left suprapubic area.  Tissue texture change appreciated in this area.  No discrete masses palpated BS present. Neuro: Normal gait. PERLA. EOMi. Alert. Oriented x3 Psych: Normal affect, dress and demeanor. Normal speech. Normal thought content and judgment.   No results found. No results found. No results found for this or any previous visit (from the past 24 hour(s)).  Assessment/Plan: Angela [name] is a 70 y.o. female present for OV for  Current left lower abdominal pain/diverticulosis H/o extensive diverticulosis. -Repeated labs today BMP, CBC, CRP.  Concern for diverticular perforation versus abscess.  Symptoms returned within 2 weeks of complete treatment on antibiotics. - elected to re-start Augmentin BID.  - encouraged senakot nightly - avoid constipation.  -Encourage soft diet for at least next 48 hours. -CT abdomen pelvis ordered to rule out perforation or abscess.  With quick recurrence, will refer back to her gastroenterologist after CT results  received.   External hemorrhoid: Anusol cream prescribed.  She deferred exam today and states it is a normal external hemorrhoid which is common for her.  However usually Preparation H works to help relieve symptoms and has not this time.  She denies any thrombosis or drainage.  Reviewed expectations re: course of current medical issues. Discussed self-management of symptoms. Outlined signs and symptoms indicating need for more acute intervention. Patient verbalized understanding and all questions were answered. Patient received an After-Visit Summary.    Orders Placed This Encounter  Procedures   CT Abdomen Pelvis W Contrast   CBC w/Diff   C-reactive protein   Basic Metabolic Panel (BMET)    Meds ordered this encounter  Medications   amoxicillin-clavulanate (AUGMENTIN) 875-125 MG tablet  Sig: Take 1 tablet by mouth 2 (two) times daily.    Dispense:  20 tablet    Refill:  0   hydrocortisone (ANUSOL-HC) 2.5 % rectal cream    Sig: Place 1 application rectally 2 (two) times daily.    Dispense:  30 g    Refill:  0    Anusol, protocort or any generic formulation covered for her    Referral Orders  No referral(s) requested today     Note is dictated utilizing voice recognition software. Although note has been proof read prior to signing, occasional typographical errors still can be missed. If any questions arise, please do not hesitate to call for verification.   electronically signed by:  Felix Pacini, DO  [facility] [place]

## 2020-07-29 NOTE — Patient Instructions (Signed)
Diverticulitis °Diverticulitis is when small pouches in your colon (large intestine) get infected or swollen. This causes pain in the belly (abdomen) and watery poop (diarrhea). °These pouches are called diverticula. The pouches form in people who have a condition called diverticulosis. °What are the causes? °This condition may be caused by poop (stool) that gets trapped in the pouches in your colon. The poop lets germs (bacteria) grow in the pouches. This causes the infection. °What increases the risk? °You are more likely to get this condition if you have small pouches in your colon. The risk is higher if: °You are overweight or very overweight (obese). °You do not exercise enough. °You drink alcohol. °You smoke or use products with tobacco in them. °You eat a diet that has a lot of red meat such as beef, pork, or lamb. °You eat a diet that does not have enough fiber in it. °You are older than 68 years of age. °What are the signs or symptoms? °Pain in the belly. Pain is often on the left side, but it may be in other areas. °Fever and feeling cold. °Feeling like you may vomit. °Vomiting. °Having cramps. °Feeling full. °Changes to how often you poop. °Blood in your poop. °How is this treated? °Most cases are treated at home by: °Taking over-the-counter pain medicines. °Following a clear liquid diet. °Taking antibiotic medicines. °Resting. °Very bad cases may need to be treated at a hospital. This may include: °Not eating or drinking. °Taking prescription pain medicine. °Getting antibiotic medicines through an IV tube. °Getting fluid and food through an IV tube. °Having surgery. °When you are feeling better, your doctor may tell you to have a test to check your colon (colonoscopy). °Follow these instructions at home: °Medicines °Take over-the-counter and prescription medicines only as told by your doctor. These include: °Antibiotics. °Pain medicines. °Fiber pills. °Probiotics. °Stool softeners. °If you were  prescribed an antibiotic medicine, take it as told by your doctor. Do not stop taking the antibiotic even if you start to feel better. °Ask your doctor if the medicine prescribed to you requires you to avoid driving or using machinery. °Eating and drinking ° °Follow a diet as told by your doctor. °When you feel better, your doctor may tell you to change your diet. You may need to eat a lot of fiber. Fiber makes it easier to poop (have a bowel movement). Foods with fiber include: °Berries. °Beans. °Lentils. °Green vegetables. °Avoid eating red meat. °General instructions °Do not use any products that contain nicotine or tobacco, such as cigarettes, e-cigarettes, and chewing tobacco. If you need help quitting, ask your doctor. °Exercise 3 or more times a week. Try to get 30 minutes each time. Exercise enough to sweat and make your heart beat faster. °Keep all follow-up visits as told by your doctor. This is important. °Contact a doctor if: °Your pain does not get better. °You are not pooping like normal. °Get help right away if: °Your pain gets worse. °Your symptoms do not get better. °Your symptoms get worse very fast. °You have a fever. °You vomit more than one time. °You have poop that is: °Bloody. °Black. °Tarry. °Summary °This condition happens when small pouches in your colon get infected or swollen. °Take medicines only as told by your doctor. °Follow a diet as told by your doctor. °Keep all follow-up visits as told by your doctor. This is important. °This information is not intended to replace advice given to you by your health care provider. Make sure you   discuss any questions you have with your health care provider. °Document Revised: 11/05/2018 Document Reviewed: 11/05/2018 °Elsevier Patient Education © 2022 [employer]vier Inc. ° °

## 2020-07-30 LAB — BASIC METABOLIC PANEL
BUN: 10 mg/dL (ref 6–23)
CO2: 28 mEq/L (ref 19–32)
Calcium: 9 mg/dL (ref 8.4–10.5)
Chloride: 102 mEq/L (ref 96–112)
Creatinine, Ser: 0.59 mg/dL (ref 0.40–1.20)
GFR: 93.08 mL/min (ref >60.00)
Glucose, Bld: 100 mg/dL — ABNORMAL HIGH (ref 70–99)
Potassium: 4 mEq/L (ref 3.5–5.1)
Sodium: 139 mEq/L (ref 135–145)

## 2020-07-30 LAB — C-REACTIVE PROTEIN: CRP: 1 mg/dL (ref 0.5–20.0)

## 2020-07-30 LAB — CBC WITH DIFFERENTIAL/PLATELET
Basophils Absolute: 0.1 10*3/uL (ref 0.0–0.1)
Basophils Relative: 0.9 % (ref 0.0–3.0)
Eosinophils Absolute: 0.4 10*3/uL (ref 0.0–0.7)
Eosinophils Relative: 3.7 % (ref 0.0–5.0)
HCT: 39 % (ref 36.0–46.0)
Hemoglobin: 13.2 g/dL (ref 12.0–15.0)
Lymphocytes Relative: 18.9 % (ref 12.0–46.0)
Lymphs Abs: 2.2 10*3/uL (ref 0.7–4.0)
MCHC: 33.9 g/dL (ref 30.0–36.0)
MCV: 90.1 fl (ref 78.0–100.0)
Monocytes Absolute: 0.7 10*3/uL (ref 0.1–1.0)
Monocytes Relative: 6 % (ref 3.0–12.0)
Neutro Abs: 8.4 10*3/uL — ABNORMAL HIGH (ref 1.4–7.7)
Neutrophils Relative %: 70.5 % (ref 43.0–77.0)
Platelets: 440 10*3/uL — ABNORMAL HIGH (ref 150.0–400.0)
RBC: 4.33 Mil/uL (ref 3.87–5.11)
RDW: 13.5 % (ref 11.5–15.5)
WBC: 11.9 10*3/uL — ABNORMAL HIGH (ref 4.0–10.5)

## 2020-07-31 ENCOUNTER — Encounter (HOSPITAL_BASED_OUTPATIENT_CLINIC_OR_DEPARTMENT_OTHER): Payer: Self-pay

## 2020-07-31 ENCOUNTER — Other Ambulatory Visit: Payer: Self-pay

## 2020-07-31 ENCOUNTER — Ambulatory Visit (HOSPITAL_BASED_OUTPATIENT_CLINIC_OR_DEPARTMENT_OTHER)
Admission: RE | Admit: 2020-07-31 | Discharge: 2020-07-31 | Disposition: A | Discharge status: Home / Self Care | Payer: Medicare HMO | Source: Ambulatory Visit | Attending: Family Medicine | Admitting: Family Medicine

## 2020-07-31 DIAGNOSIS — K5792 Diverticulitis of intestine, part unspecified, without perforation or abscess without bleeding: Secondary | ICD-10-CM | POA: Insufficient documentation

## 2020-07-31 DIAGNOSIS — R1030 Lower abdominal pain, unspecified: Secondary | ICD-10-CM

## 2020-07-31 DIAGNOSIS — R1032 Left lower quadrant pain: Secondary | ICD-10-CM | POA: Insufficient documentation

## 2020-07-31 MED ORDER — IOHEXOL 300 MG/ML  SOLN
75.0000 mL | Freq: Once | INTRAMUSCULAR | Status: AC | PRN
  Administered 2020-07-31: 75 mL via INTRAVENOUS

## 2020-08-03 ENCOUNTER — Telehealth: Payer: Self-pay

## 2020-08-03 DIAGNOSIS — K5792 Diverticulitis of intestine, part unspecified, without perforation or abscess without bleeding: Secondary | ICD-10-CM

## 2020-08-03 NOTE — Telephone Encounter (Signed)
Spoke with pt regarding labs and instructions.   

## 2020-08-03 NOTE — Telephone Encounter (Signed)
-----   Message from Natalia Leatherwood, DO sent at 08/03/2020  8:14 AM EDT ----- These inform patient her CT is positive for an inflammation of the lining of her colon.  There is no abscess or perforation of her colon such as from a diverticulitis perforation. I recommend she follow-up with her gastroenterologist since this is a recurrent colitis.  Please place urgent referral back to her gastroenterologist for recurrent colitis.   Thanks

## 2020-08-28 ENCOUNTER — Other Ambulatory Visit: Payer: Self-pay | Admitting: Physician Assistant

## 2020-08-28 DIAGNOSIS — R1030 Lower abdominal pain, unspecified: Secondary | ICD-10-CM

## 2020-09-17 ENCOUNTER — Ambulatory Visit
Admission: RE | Admit: 2020-09-17 | Discharge: 2020-09-17 | Disposition: A | Discharge status: Home / Self Care | Payer: Medicare HMO | Source: Ambulatory Visit | Attending: Physician Assistant | Admitting: Physician Assistant

## 2020-09-17 DIAGNOSIS — R1030 Lower abdominal pain, unspecified: Secondary | ICD-10-CM

## 2020-09-17 MED ORDER — IOPAMIDOL (ISOVUE-370) INJECTION 76%
80.0000 mL | Freq: Once | INTRAVENOUS | Status: AC | PRN
  Administered 2020-09-17: 80 mL via INTRAVENOUS

## 2020-09-22 ENCOUNTER — Telehealth: Payer: Self-pay

## 2020-09-22 MED ORDER — LISINOPRIL 5 MG PO TABS
5.0000 mg | ORAL_TABLET | Freq: Every day | ORAL | 0 refills | Status: DC
Start: 2020-09-22 — End: 2020-10-21

## 2020-09-22 NOTE — Telephone Encounter (Signed)
Pt was advised 30 d supply given until scheduled appt.

## 2020-09-22 NOTE — Telephone Encounter (Signed)
Patient refill meds.  She is out of meds.  I scheduled her the first available date (Thursday and Friday is patient preference)  On 10/23/20.   Patient can be reached at [phone].  lisinopril (ZESTRIL) 5 MG tablet [[id number]]   [facility] [id number] - [place], [place] - [address]

## 2020-09-23 ENCOUNTER — Other Ambulatory Visit: Payer: Self-pay

## 2020-10-23 ENCOUNTER — Ambulatory Visit (INDEPENDENT_AMBULATORY_CARE_PROVIDER_SITE_OTHER): Payer: Medicare HMO | Admitting: Family Medicine

## 2020-10-23 ENCOUNTER — Other Ambulatory Visit: Payer: Self-pay

## 2020-10-23 ENCOUNTER — Encounter: Payer: Self-pay | Admitting: Family Medicine

## 2020-10-23 VITALS — BP 133/83 | HR 61 | Temp 97.7°F | Ht 64.0 in | Wt 131.0 lb

## 2020-10-23 DIAGNOSIS — R7309 Other abnormal glucose: Secondary | ICD-10-CM

## 2020-10-23 DIAGNOSIS — I10 Essential (primary) hypertension: Secondary | ICD-10-CM | POA: Diagnosis not present

## 2020-10-23 DIAGNOSIS — Z23 Encounter for immunization: Secondary | ICD-10-CM

## 2020-10-23 LAB — CBC WITH DIFFERENTIAL/PLATELET
Basophils Absolute: 0.1 10*3/uL (ref 0.0–0.1)
Basophils Relative: 0.5 % (ref 0.0–3.0)
Eosinophils Absolute: 0.3 10*3/uL (ref 0.0–0.7)
Eosinophils Relative: 2.1 % (ref 0.0–5.0)
HCT: 41.3 % (ref 36.0–46.0)
Hemoglobin: 13.7 g/dL (ref 12.0–15.0)
Lymphocytes Relative: 17.5 % (ref 12.0–46.0)
Lymphs Abs: 2.3 10*3/uL (ref 0.7–4.0)
MCHC: 33.2 g/dL (ref 30.0–36.0)
MCV: 91.2 fl (ref 78.0–100.0)
Monocytes Absolute: 0.7 10*3/uL (ref 0.1–1.0)
Monocytes Relative: 5.5 % (ref 3.0–12.0)
Neutro Abs: 9.9 10*3/uL — ABNORMAL HIGH (ref 1.4–7.7)
Neutrophils Relative %: 74.4 % (ref 43.0–77.0)
Platelets: 368 10*3/uL (ref 150.0–400.0)
RBC: 4.53 Mil/uL (ref 3.87–5.11)
RDW: 14 % (ref 11.5–15.5)
WBC: 13.3 10*3/uL — ABNORMAL HIGH (ref 4.0–10.5)

## 2020-10-23 LAB — LDL CHOLESTEROL, DIRECT: Direct LDL: 124 mg/dL

## 2020-10-23 LAB — TSH: TSH: 0.6 u[IU]/mL (ref 0.35–5.50)

## 2020-10-23 LAB — HEMOGLOBIN A1C: Hgb A1c MFr Bld: 6 % (ref 4.6–6.5)

## 2020-10-23 MED ORDER — LISINOPRIL 5 MG PO TABS: 5.0000 mg | ORAL_TABLET | Freq: Every day | ORAL | 1 refills | Status: DC

## 2020-10-23 NOTE — Patient Instructions (Signed)
Great to see you today.  I have refilled the medication(s) we provide.   If labs were collected, we will inform you of lab results once received either by echart message or telephone call.   - echart message- for normal results that have been seen by the patient already.   - telephone call: abnormal results or if patient has not viewed results in their echart.  

## 2020-10-23 NOTE — Progress Notes (Signed)
This visit occurred during the SARS-CoV-2 public health emergency.  Safety protocols were in place, including screening questions prior to the visit, additional usage of staff PPE, and extensive cleaning of exam room while observing appropriate contact time as indicated for disinfecting solutions.    Patient ID: Angela [name], female  DOB: 1950-10-11, 70 y.o.   MRN: [id number] Patient Care Team    Relationship Specialty Notifications Start End  Natalia Leatherwood, DO PCP - General Family Medicine  04/10/17   Willis Modena, MD Consulting Physician Gastroenterology  06/19/19     Chief Complaint  Patient presents with   Hypertension    CMC; pt is not fasting    Subjective: Angela [name] is a 70 y.o.  Female  present for [facility] All past medical history, surgical history, allergies, family history, immunizations, medications and social history were updated in the electronic medical record today. All recent labs, ED visits and hospitalizations within the last year were reviewed.   Hypertension:  Pt reports compliance with lisinopril 5 mg . Blood pressures ranges at home WNL. Patient denies chest pain, shortness of breath, dizziness or lower extremity edema.  She has been under a good bit of stress over the last few weeks.  Her daughter had her baby.  However her daughter is having complications with preeclampsia/hypertension. Pt does not take a daily baby ASA. Pt is not prescribed statin. RF: Hypertension, former smoker, family history heart disease   Depression screen [facility] 2/9 03/18/2020 01/22/2020 06/19/2019 09/12/2017 04/10/2017  Decreased Interest 0 0 0 0 0  Down, Depressed, Hopeless 0 0 0 0 0  PHQ - 2 Score 0 0 0 0 0   No flowsheet data found.   Immunization History  Administered Date(s) Administered   Fluad Quad(high Dose 65+) 10/23/2020   Influenza, High Dose Seasonal PF 10/14/2016, 11/30/2017, 11/02/2018   Influenza-Unspecified 01/08/2001, 11/13/2001, 11/20/2012, 11/08/2019    PFIZER(Purple Top)SARS-COV-2 Vaccination 03/17/2019, 04/11/2019, 01/19/2020   PNEUMOCOCCAL CONJUGATE-20 10/23/2020   Pneumococcal Conjugate-13 06/19/2019   Td 02/07/1998   Tdap 04/10/2017   Zoster Recombinat (Shingrix) 04/10/2017, 09/12/2017    Past Medical History:  Diagnosis Date   Arthritis    Blood in stool    greater than 4 years since last incidence of blood in stool    Colon polyps    Glaucoma    Hyperlipidemia    Hypertension    Inclusion cyst 2021   face   PNA (pneumonia) 2006   Strain of left wrist 04/16/2019   Allergies  Allergen Reactions   Codeine Other (See Comments)    Irritable , insomnia    Pollen Extract    Past Surgical History:  Procedure Laterality Date   ABSCESS DRAINAGE  2021   face- inclusion cyst   BREAST BIOPSY Left 07/19/2018   US guided breast bx with clip placement- benign   EYE SURGERY  1956   HEMORRHOID SURGERY  2001   RADIOACTIVE SEED GUIDED EXCISIONAL BREAST BIOPSY Left 03/15/2019   Procedure: RADIOACTIVE SEED GUIDED EXCISIONAL LEFT  BREAST BIOPSY;  Surgeon: Luretha Murphy, MD;  Location: [facility];  Service: General;  Laterality: Left;   TONSILLECTOMY  1963   WISDOM TOOTH EXTRACTION     Family History  Problem Relation Age of Onset   Heart attack Father    Heart attack Sister    Hyperlipidemia Sister    Hypertension Sister    Social History   Social History Narrative   Married. Retired. 2 children.  Bachelors degree.   Exercises routinely.   Drinks caffeine.   Smoke alarm in the home. Wears her seatbelt. Owns firearms.   Feels safe in her relationships.    Allergies as of 10/23/2020       Reactions   Codeine Other (See Comments)   Irritable , insomnia    Pollen Extract         Medication List        Accurate as of October 23, 2020 11:07 AM. If you have any questions, ask your nurse or doctor.          STOP taking these medications    amoxicillin-clavulanate 875-125 MG tablet Commonly  known as: AUGMENTIN Stopped by: Felix Pacini, DO       TAKE these medications    cholecalciferol 1000 units tablet Commonly known as: VITAMIN D Take 1,000 Units by mouth daily.   DAILY PROBIOTIC PO Take by mouth.   FIBER-CAPS PO Take by mouth.   hydrocortisone 2.5 % rectal cream Commonly known as: Anusol-HC Place 1 application rectally 2 (two) times daily.   latanoprost 0.005 % ophthalmic solution Commonly known as: XALATAN Place 1 drop into both eyes at bedtime.   Linzess 145 MCG Caps capsule Generic drug: linaclotide Take 145 mcg by mouth daily.   lisinopril 5 MG tablet Commonly known as: ZESTRIL Take 1 tablet (5 mg total) by mouth daily.   multivitamin tablet Take 1 tablet by mouth daily.   timolol 0.5 % ophthalmic solution Commonly known as: TIMOPTIC        All past medical history, surgical history, allergies, family history, immunizations andmedications were updated in the EMR today and reviewed under the history and medication portions of their EMR.      ROS: 14 pt review of systems performed and negative (unless mentioned in an HPI)  Objective: BP 133/83   Pulse 61   Temp 97.7 F (36.5 C) (Oral)   Ht 5\' 4"  (1.626 m)   Wt 131 lb (59.4 kg)   SpO2 98%   BMI 22.49 kg/m  Gen: Afebrile. No acute distress. Pleasant female.  HENT: AT. [place].  Eyes:Pupils Equal Round Reactive to light, Extraocular movements intact,  Conjunctiva without redness, discharge or icterus. Neck/lymp/endocrine: Supple,no lymphadenopathy, no thyromegaly CV: RRR no murmur, no edema, +2/4 P posterior tibialis pulses Chest: CTAB, no wheeze or crackles Neuro: Normal gait. PERLA. EOMi. Alert. Oriented x3 Psych: Normal affect, dress and demeanor. Normal speech. Normal thought content and judgment.   No results found.  Assessment/plan: Angela [name] is a 70 y.o. female present for [facility] Essential hypertension Stable.  Although with in normal limits, above her usual today.  Likely  due to stress surrounding her daughter's delivery.  She will monitor her blood pressure over the next few weeks and if remains elevated would consider increasing lisinopril dose at that time. -CBC, TSH and LDL collected today.  Patient is not fasting.  Other labs up-to-date. -Continue lisinopril 5 mg  -Routine exercise encourage -Low-sodium heart healthy diet encouraged Follow-up 5.5 mos, sooner if blood pressures above goal.  elevated a1c: 6.0 last appt 06/2019 Dietary changes and increase exercise.  A1c collected today  Leukocytosis:  Elevated wbc 13.9>11.9 > likely 2/2 to smoking.   Cbc collected today  Pneumococcal and influenza vaccine administered today. Return in about 5 months (around 04/08/2021) for CMC (30 min).  Orders Placed This Encounter  Procedures   Pneumococcal conjugate vaccine 20-valent (Prevnar 20)   Flu Vaccine QUAD High Dose(Fluad)  Direct LDL   CBC w/Diff   TSH   Hemoglobin A1c   Meds ordered this encounter  Medications   lisinopril (ZESTRIL) 5 MG tablet    Sig: Take 1 tablet (5 mg total) by mouth daily.    Dispense:  90 tablet    Refill:  1    Referral Orders  No referral(s) requested today     Electronically signed by: Felix Pacini, DO [facility] [place]

## 2020-10-27 ENCOUNTER — Encounter: Payer: Self-pay | Admitting: Family Medicine

## 2020-10-27 ENCOUNTER — Telehealth: Payer: Self-pay | Admitting: Family Medicine

## 2020-10-27 MED ORDER — ATORVASTATIN CALCIUM 20 MG PO TABS: 20.0000 mg | ORAL_TABLET | Freq: Every day | ORAL | 3 refills | Status: DC

## 2020-10-27 NOTE — Telephone Encounter (Signed)
Attempted to contact pt, unable to LVM due to box being full.

## 2020-10-27 NOTE — Telephone Encounter (Signed)
Please call patient thyroid function is normal Blood cell counts are normal with the exception of elevated wbc. This is from her smoking history.  Diabetes screening/A1c is in the prediabetes range at 6.0.Increase exercise and avoid high sugar/carbohydrate loads.  Cholesterol overall is ok, however since she is treated for hypertension, is/was a smoker and had fhx heart disease >her LDL goal would be lower than 100 at least.  By criteria of american heart association she is at 17.7% chance of heart attack or stroke in the next 10 years, and therefore meets criteria to consider statin start for cardiovascular protection.   I have called in a very low dose of lipitor for her to start before bed if she is agreeable. I do not thing she will need much to get her to goal LDL and it will provide the extra cardiovascular protection even though low dose.  We will recheck her cholesterol a her next appt.

## 2020-10-28 NOTE — Telephone Encounter (Signed)
Spoke with pt regarding labs and instructions.   

## 2021-02-02 ENCOUNTER — Telehealth (INDEPENDENT_AMBULATORY_CARE_PROVIDER_SITE_OTHER): Payer: Medicare HMO | Admitting: Family Medicine

## 2021-02-02 DIAGNOSIS — U071 COVID-19: Secondary | ICD-10-CM

## 2021-02-02 MED ORDER — BENZONATATE 100 MG PO CAPS: 100.0000 mg | ORAL_CAPSULE | Freq: Three times a day (TID) | ORAL | 0 refills | Status: DC | PRN

## 2021-02-02 NOTE — Progress Notes (Signed)
Virtual Visit via Video Note  I connected with Angela [name]  on 02/02/21 at 11:20 AM EST by a video enabled telemedicine application and verified that I am speaking with the correct person using two identifiers.  Location patient: home, [place] Location provider:work or home office Persons participating in the virtual visit: patient, provider  I discussed the limitations of evaluation and management by telemedicine and the availability of in person appointments. The patient expressed understanding and agreed to proceed.   HPI:  Acute telemedicine visit for cough and congestion: -Onset: 2 days ago -Symptoms include: scratchy throat, nasal congestion, cough, ear full, pnd, some body aches -Denies: fever, NVD, CP, SOB -saw a lot of people over the holidays - some of the people a the party tested positive for covid and she received a text; she tested yesterday and was negative -Has tried:musinex -Pertinent past medical history: see below -Pertinent medication allergies:  Allergies  Allergen Reactions   Codeine Other (See Comments)    Irritable , insomnia    Pollen Extract   -COVID-19 vaccine status: had 2 doses and one booster Immunization History  Administered Date(s) Administered   Fluad Quad(high Dose 65+) 10/23/2020   Influenza, High Dose Seasonal PF 10/14/2016, 11/30/2017, 11/02/2018   Influenza-Unspecified 01/08/2001, 11/13/2001, 11/20/2012, 11/08/2019   PFIZER(Purple Top)SARS-COV-2 Vaccination 03/17/2019, 04/11/2019, 01/19/2020   PNEUMOCOCCAL CONJUGATE-20 10/23/2020   Pneumococcal Conjugate-13 06/19/2019   Td 02/07/1998   Tdap 04/10/2017   Zoster Recombinat (Shingrix) 04/10/2017, 09/12/2017     ROS: See pertinent positives and negatives per HPI.  Past Medical History:  Diagnosis Date   Arthritis    Blood in stool    greater than 4 years since last incidence of blood in stool    Colon polyps    Glaucoma    Hyperlipidemia    Hypertension    Inclusion cyst 2021   face   PNA  (pneumonia) 2006   Strain of left wrist 04/16/2019    Past Surgical History:  Procedure Laterality Date   ABSCESS DRAINAGE  2021   face- inclusion cyst   BREAST BIOPSY Left 07/19/2018   US guided breast bx with clip placement- benign   EYE SURGERY  1956   HEMORRHOID SURGERY  2001   RADIOACTIVE SEED GUIDED EXCISIONAL BREAST BIOPSY Left 03/15/2019   Procedure: RADIOACTIVE SEED GUIDED EXCISIONAL LEFT  BREAST BIOPSY;  Surgeon: Luretha Murphy, MD;  Location: [facility];  Service: General;  Laterality: Left;   TONSILLECTOMY  1963   WISDOM TOOTH EXTRACTION       Current Outpatient Medications:    atorvastatin (LIPITOR) 20 MG tablet, Take 1 tablet (20 mg total) by mouth at bedtime., Disp: 90 tablet, Rfl: 3   Calcium Polycarbophil (FIBER-CAPS PO), Take by mouth., Disp: , Rfl:    cholecalciferol (VITAMIN D) 1000 units tablet, Take 1,000 Units by mouth daily., Disp: , Rfl:    hydrocortisone (ANUSOL-HC) 2.5 % rectal cream, Place 1 application rectally 2 (two) times daily., Disp: 30 g, Rfl: 0   latanoprost (XALATAN) 0.005 % ophthalmic solution, Place 1 drop into both eyes at bedtime., Disp: , Rfl: 4   LINZESS 145 MCG CAPS capsule, Take 145 mcg by mouth daily., Disp: , Rfl:    lisinopril (ZESTRIL) 5 MG tablet, Take 1 tablet (5 mg total) by mouth daily., Disp: 90 tablet, Rfl: 1   Multiple Vitamin (MULTIVITAMIN) tablet, Take 1 tablet by mouth daily., Disp: , Rfl:    Probiotic Product (DAILY PROBIOTIC PO), Take by mouth., Disp: , Rfl:  timolol (TIMOPTIC) 0.5 % ophthalmic solution, , Disp: , Rfl:   EXAM:  VITALS per patient if applicable:  GENERAL: alert, oriented, appears well and in no acute distress  HEENT: atraumatic, conjunttiva clear, no obvious abnormalities on inspection of external nose and ears  NECK: normal movements of the head and neck  LUNGS: on inspection no signs of respiratory distress, breathing rate appears normal, no obvious gross SOB, gasping or  wheezing  CV: no obvious cyanosis  MS: moves all visible extremities without noticeable abnormality  PSYCH/NEURO: pleasant and cooperative, no obvious depression or anxiety, speech and thought processing grossly intact  ASSESSMENT AND PLAN:  Discussed the following assessment and plan:  COVID-19  -we discussed possible serious and likely etiologies, options for evaluation and workup, limitations of telemedicine visit vs in person visit, treatment, treatment risks and precautions. Pt is agreeable to treatment via telemedicine at this moment. Query COVID19, VURI, possible influenza vs other. Advised repeat covid testing today and tomorrow as many folks are testing negative the first few days if vaccinated or had prior infection. Advised can contact [facility] pharmacy or do f/u vv if wants antiviral and has positive test in first 5 days of symptoms. Otherwise sent rx for cough and other symptomatic care measures summarized in pt instructions.  Advised to seek prompt vv or in person care if worsening, new symptoms arise, or if is not improving with treatment. Discussed options for inperson care if PCP office not available. Did let this patient know that I only do telemedicine on Tuesdays and Thursdays for [place]. Advised to schedule follow up visit with PCP or [facility] if any further questions or concerns to avoid delays in care.   I discussed the assessment and treatment plan with the patient. The patient was provided an opportunity to ask questions and all were answered. The patient agreed with the plan and demonstrated an understanding of the instructions.     Terressa Koyanagi, DO

## 2021-02-02 NOTE — Patient Instructions (Addendum)
°  HOME CARE TIPS: ° °-COVID19 testing information: °https://covid19.ncdhhs.gov/FindTests ° °Most pharmacies also offer testing and home test kits. If the Covid19 test is positive and you desire antiviral treatment, please contact a [facility] pharmacy or schedule a follow up virtual visit through your primary care office or through the [place] website.  °Other test to treat options: °https://covid19.ncdhhs.gov/FindTreatment?click_source=alert ° °-I sent the medication(s) we discussed to your pharmacy: °Meds ordered this encounter  °Medications  ° benzonatate (TESSALON PERLES) 100 MG capsule  °  Sig: Take 1 capsule (100 mg total) by mouth 3 (three) times daily as needed.  °  Dispense:  30 capsule  °  Refill:  0  ° °-can use tylenol if needed for fevers, aches and pains per instructions ° °-can use nasal saline a few times per day if you have nasal congestion ° °-stay hydrated, drink plenty of fluids and eat small healthy meals - avoid dairy ° °-can take 1000 IU (25mcg) Vit D3 and 100-500 mg of Vit C daily per instructions ° °-If the Covid test is positive, check out the CDC website for more information on home care, transmission and treatment for COVID19 ° °-follow up with your doctor in 2-3 days unless improving and feeling better ° °-stay home while sick, except to seek medical care. If you have COVID19, you will likely be contagious for 7-10 days. Flu or Influenza is likely contagious for about 7 days. Other respiratory viral infections remain contagious for 5-10+ days depending on the virus and many other factors. Wear a good mask that fits snugly (such as N95 or KN95) if around others to reduce the risk of transmission. ° °It was nice to meet you today, and I really hope you are feeling better soon. I help [facility] out with telemedicine visits on Tuesdays and Thursdays and am happy to help if you need a follow up virtual visit on those days. Otherwise, if you have any concerns or questions following this  visit please schedule a follow up visit with your Primary Care doctor or seek care at a local urgent care clinic to avoid delays in care.  ° ° °Seek in person care or schedule a follow up video visit promptly if your symptoms worsen, new concerns arise or you are not improving with treatment. Call 911 and/or seek emergency care if your symptoms are severe or life threatening. ° ° °

## 2021-02-04 ENCOUNTER — Other Ambulatory Visit (HOSPITAL_BASED_OUTPATIENT_CLINIC_OR_DEPARTMENT_OTHER): Payer: Self-pay

## 2021-02-04 ENCOUNTER — Telehealth (HOSPITAL_BASED_OUTPATIENT_CLINIC_OR_DEPARTMENT_OTHER): Payer: Self-pay | Admitting: Pharmacist

## 2021-02-04 MED ORDER — PAXLOVID (300/100) 20 X 150 MG & 10 X 100MG PO TBPK
ORAL_TABLET | ORAL | 0 refills | Status: DC
  Filled 2021-02-04: qty 30, 5d supply, fill #0

## 2021-02-04 NOTE — Telephone Encounter (Signed)
Outpatient Pharmacy Oral COVID Treatment Note  I connected with Angela [name] on 02/04/2021/12:01 PM by telephone and verified that I am speaking with the correct person using two identifiers.  I discussed the limitations, risks, security, and privacy concerns of performing an evaluation and management service by telephone and the availability of in person appointments via referral to a physician. The patient expressed understanding and agreed to proceed.  Pharmacy location: [facility]  Diagnosis: COVID-19 infection  Purpose of visit: Discussion of potential use of Paxlovid, a new treatment for mild to moderate COVID-19 viral infection in non-hospitalized patients.  Subjective/Objective: Patient is a 70 y.o. female who is presenting with COVID 19 viral infection.  COVID 19 viral infection. Their symptoms began on 02/01/2021 with coughing, sore throat, dizziness, ear aching.  The patient has confirmed COVID-19 via a home test on 02/03/2021.   Past Medical History:  Diagnosis Date   Arthritis    Blood in stool    greater than 4 years since last incidence of blood in stool    Colon polyps    Glaucoma    Hyperlipidemia    Hypertension    Inclusion cyst 2021   face   PNA (pneumonia) 2006   Strain of left wrist 04/16/2019     Allergies  Allergen Reactions   Codeine Other (See Comments)    Irritable , insomnia    Pollen Extract      Current Outpatient Medications:    atorvastatin (LIPITOR) 20 MG tablet, Take 1 tablet (20 mg total) by mouth at bedtime., Disp: 90 tablet, Rfl: 3   benzonatate (TESSALON PERLES) 100 MG capsule, Take 1 capsule (100 mg total) by mouth 3 (three) times daily as needed., Disp: 30 capsule, Rfl: 0   Calcium Polycarbophil (FIBER-CAPS PO), Take by mouth., Disp: , Rfl:    cholecalciferol (VITAMIN D) 1000 units tablet, Take 1,000 Units by mouth daily., Disp: , Rfl:    hydrocortisone (ANUSOL-HC) 2.5 % rectal cream, Place 1 application  rectally 2 (two) times daily., Disp: 30 g, Rfl: 0   latanoprost (XALATAN) 0.005 % ophthalmic solution, Place 1 drop into both eyes at bedtime., Disp: , Rfl: 4   LINZESS 145 MCG CAPS capsule, Take 145 mcg by mouth daily., Disp: , Rfl:    lisinopril (ZESTRIL) 5 MG tablet, Take 1 tablet (5 mg total) by mouth daily., Disp: 90 tablet, Rfl: 1   Multiple Vitamin (MULTIVITAMIN) tablet, Take 1 tablet by mouth daily., Disp: , Rfl:    nirmatrelvir & ritonavir (PAXLOVID, 300/100,) 20 x 150 MG & 10 x 100MG TBPK, Take 3 tablets by mouth twice daily., Disp: 30 tablet, Rfl: 0   Probiotic Product (DAILY PROBIOTIC PO), Take by mouth., Disp: , Rfl:    timolol (TIMOPTIC) 0.5 % ophthalmic solution, , Disp: , Rfl:   Lab Monitoring: eGFR 93 (07/29/20)  Drug Interactions Noted: atorvastatin - patient will stop taking during paxlovid therapy   Plan:  This patient is a 70 y.o. female that meets the criteria for Emergency Use Authorization of Paxlovid. After reviewing the emergency use authorization with the patient, the patient agrees to receive Paxlovid.  Through FDA guidance and current [facility] standing order Paxlovid will be prescribed to the patient.   Patient contacted for counseling on 02/04/2021 and verbalized understanding.   Delivery or Pick-Up Date: Pick-Up  Follow up instructions:    Take prescription BID x 5 days as directed Counseling was provided by pharmacist. Reach out to pharmacist with follow  up questions For concerns regarding further COVID symptoms please follow up with your PCP or urgent care For urgent or life-threatening issues, seek care at your local emergency [place] 02/04/2021, 12:01 PM [facility] Pharmacist Phone# [phone]

## 2021-02-05 ENCOUNTER — Other Ambulatory Visit (HOSPITAL_BASED_OUTPATIENT_CLINIC_OR_DEPARTMENT_OTHER): Payer: Self-pay

## 2021-03-24 ENCOUNTER — Other Ambulatory Visit: Payer: Self-pay

## 2021-03-24 ENCOUNTER — Ambulatory Visit (INDEPENDENT_AMBULATORY_CARE_PROVIDER_SITE_OTHER): Payer: Medicare HMO

## 2021-03-24 DIAGNOSIS — Z0000 Encounter for general adult medical examination without abnormal findings: Secondary | ICD-10-CM

## 2021-03-24 NOTE — Patient Instructions (Signed)
Angela [name] , Thank you for taking time to come for your Medicare Wellness Visit. I appreciate your ongoing commitment to your health goals. Please review the following plan we discussed and let me know if I can assist you in the future.   Screening recommendations/referrals: Colonoscopy: Done 07/09/19 repeat every 5 years  Mammogram: Done 03/02/20 repeat every year  Bone Density: Done 05/11/17 repeat every 10 years  Recommended yearly ophthalmology/optometry visit for glaucoma screening and checkup Recommended yearly dental visit for hygiene and checkup  Vaccinations: Influenza vaccine: Done 10/23/20 repeat every year  Pneumococcal vaccine: Up to date Tdap vaccine: Done 04/10/17 repeat every 10 years  Shingles vaccine: Completed 3/4, 09/12/17   Covid-19:Completed 2/7, 3/4, 01/19/20  Advanced directives: Please bring a copy of your health care power of attorney and living will to the office at your convenience.  Conditions/risks identified: Stay healthy and active   Next appointment: Follow up in one year for your annual wellness visit    Preventive Care 65 Years and Older, Female Preventive care refers to lifestyle choices and visits with your health care provider that can promote health and wellness. What does preventive care include? A yearly physical exam. This is also called an annual well check. Dental exams once or twice a year. Routine eye exams. Ask your health care provider how often you should have your eyes checked. Personal lifestyle choices, including: Daily care of your teeth and gums. Regular physical activity. Eating a healthy diet. Avoiding tobacco and drug use. Limiting alcohol use. Practicing safe sex. Taking low-dose aspirin every day. Taking vitamin and mineral supplements as recommended by your health care provider. What happens during an annual well check? The services and screenings done by your health care provider during your annual well check will depend on  your age, overall health, lifestyle risk factors, and family history of disease. Counseling  Your health care provider may ask you questions about your: Alcohol use. Tobacco use. Drug use. Emotional well-being. Home and relationship well-being. Sexual activity. Eating habits. History of falls. Memory and ability to understand (cognition). Work and work Astronomer. Reproductive health. Screening  You may have the following tests or measurements: Height, weight, and BMI. Blood pressure. Lipid and cholesterol levels. These may be checked every 5 years, or more frequently if you are over 20 years old. Skin check. Lung cancer screening. You may have this screening every year starting at age 65 if you have a 30-pack-year history of smoking and currently smoke or have quit within the past 15 years. Fecal occult blood test (FOBT) of the stool. You may have this test every year starting at age 35. Flexible sigmoidoscopy or colonoscopy. You may have a sigmoidoscopy every 5 years or a colonoscopy every 10 years starting at age 109. Hepatitis C blood test. Hepatitis B blood test. Sexually transmitted disease (STD) testing. Diabetes screening. This is done by checking your blood sugar (glucose) after you have not eaten for a while (fasting). You may have this done every 1-3 years. Bone density scan. This is done to screen for osteoporosis. You may have this done starting at age 43. Mammogram. This may be done every 1-2 years. Talk to your health care provider about how often you should have regular mammograms. Talk with your health care provider about your test results, treatment options, and if necessary, the need for more tests. Vaccines  Your health care provider may recommend certain vaccines, such as: Influenza vaccine. This is recommended every year. Tetanus, diphtheria, and  acellular pertussis (Tdap, Td) vaccine. You may need a Td booster every 10 years. Zoster vaccine. You may need this  after age 92. Pneumococcal 13-valent conjugate (PCV13) vaccine. One dose is recommended after age 37. Pneumococcal polysaccharide (PPSV23) vaccine. One dose is recommended after age 20. Talk to your health care provider about which screenings and vaccines you need and how often you need them. This information is not intended to replace advice given to you by your health care provider. Make sure you discuss any questions you have with your health care provider. Document Released: 02/20/2015 Document Revised: 10/14/2015 Document Reviewed: 11/25/2014 Elsevier Interactive Patient Education  2017 [place] Prevention in the Home Falls can cause injuries. They can happen to people of all ages. There are many things you can do to make your home safe and to help prevent falls. What can I do on the outside of my home? Regularly fix the edges of walkways and driveways and fix any cracks. Remove anything that might make you trip as you walk through a door, such as a raised step or threshold. Trim any bushes or trees on the path to your home. Use bright outdoor lighting. Clear any walking paths of anything that might make someone trip, such as rocks or tools. Regularly check to see if handrails are loose or broken. Make sure that both sides of any steps have handrails. Any raised decks and porches should have guardrails on the edges. Have any leaves, snow, or ice cleared regularly. Use sand or salt on walking paths during winter. Clean up any spills in your garage right away. This includes oil or grease spills. What can I do in the bathroom? Use night lights. Install grab bars by the toilet and in the tub and shower. Do not use towel bars as grab bars. Use non-skid mats or decals in the tub or shower. If you need to sit down in the shower, use a plastic, non-slip stool. Keep the floor dry. Clean up any water that spills on the floor as soon as it happens. Remove soap buildup in the tub or  shower regularly. Attach bath mats securely with double-sided non-slip rug tape. Do not have throw rugs and other things on the floor that can make you trip. What can I do in the bedroom? Use night lights. Make sure that you have a light by your bed that is easy to reach. Do not use any sheets or blankets that are too big for your bed. They should not hang down onto the floor. Have a firm chair that has side arms. You can use this for support while you get dressed. Do not have throw rugs and other things on the floor that can make you trip. What can I do in the kitchen? Clean up any spills right away. Avoid walking on wet floors. Keep items that you use a lot in easy-to-reach places. If you need to reach something above you, use a strong step stool that has a grab bar. Keep electrical cords out of the way. Do not use floor polish or wax that makes floors slippery. If you must use wax, use non-skid floor wax. Do not have throw rugs and other things on the floor that can make you trip. What can I do with my stairs? Do not leave any items on the stairs. Make sure that there are handrails on both sides of the stairs and use them. Fix handrails that are broken or loose. Make sure that  handrails are as long as the stairways. Check any carpeting to make sure that it is firmly attached to the stairs. Fix any carpet that is loose or worn. Avoid having throw rugs at the top or bottom of the stairs. If you do have throw rugs, attach them to the floor with carpet tape. Make sure that you have a light switch at the top of the stairs and the bottom of the stairs. If you do not have them, ask someone to add them for you. What else can I do to help prevent falls? Wear shoes that: Do not have high heels. Have rubber bottoms. Are comfortable and fit you well. Are closed at the toe. Do not wear sandals. If you use a stepladder: Make sure that it is fully opened. Do not climb a closed stepladder. Make  sure that both sides of the stepladder are locked into place. Ask someone to hold it for you, if possible. Clearly mark and make sure that you can see: Any grab bars or handrails. First and last steps. Where the edge of each step is. Use tools that help you move around (mobility aids) if they are needed. These include: Canes. Walkers. Scooters. Crutches. Turn on the lights when you go into a dark area. Replace any light bulbs as soon as they burn out. Set up your furniture so you have a clear path. Avoid moving your furniture around. If any of your floors are uneven, fix them. If there are any pets around you, be aware of where they are. Review your medicines with your doctor. Some medicines can make you feel dizzy. This can increase your chance of falling. Ask your doctor what other things that you can do to help prevent falls. This information is not intended to replace advice given to you by your health care provider. Make sure you discuss any questions you have with your health care provider. Document Released: 11/20/2008 Document Revised: 07/02/2015 Document Reviewed: 02/28/2014 Elsevier Interactive Patient Education  2017 [employer].

## 2021-03-24 NOTE — Progress Notes (Signed)
Virtual Visit via Telephone Note  I connected with  Angela [name] on 03/24/21 at  1:45 PM EST by telephone and verified that I am speaking with the correct person using two identifiers.  Medicare Annual Wellness visit completed telephonically due to Covid-19 pandemic.   Persons participating in this call: This Health Coach and this patient.   Location: Patient: Home Provider: Office   I discussed the limitations, risks, security and privacy concerns of performing an evaluation and management service by telephone and the availability of in person appointments. The patient expressed understanding and agreed to proceed.  Unable to perform video visit due to video visit attempted and failed and/or patient does not have video capability.   Some vital signs may be absent or patient reported.   Angela Schlein, LPN   Subjective:   Angela [name] is a 71 y.o. female who presents for Medicare Annual (Subsequent) preventive examination.  Review of Systems           Objective:    There were no vitals filed for this visit. There is no height or weight on file to calculate BMI.  Advanced Directives 03/24/2021 03/18/2020 03/15/2019  Does Patient Have a Medical Advance Directive? Yes Yes Yes  Type of Estate agent of [employer] Power of [place];Living will Healthcare Power of [place];Living will  Copy of Healthcare Power of Attorney in Chart? - Yes - validated most recent copy scanned in chart (See row information) No - copy requested    Current Medications (verified) Outpatient Encounter Medications as of 03/24/2021  Medication Sig   atorvastatin (LIPITOR) 20 MG tablet Take 1 tablet (20 mg total) by mouth at bedtime.   Calcium Polycarbophil (FIBER-CAPS PO) Take by mouth.   cholecalciferol (VITAMIN D) 1000 units tablet Take 1,000 Units by mouth daily.   latanoprost (XALATAN) 0.005 % ophthalmic solution Place 1 drop into both eyes at bedtime.   LINZESS 145  MCG CAPS capsule Take 145 mcg by mouth daily.   lisinopril (ZESTRIL) 5 MG tablet Take 1 tablet (5 mg total) by mouth daily.   Multiple Vitamin (MULTIVITAMIN) tablet Take 1 tablet by mouth daily.   Probiotic Product (DAILY PROBIOTIC PO) Take by mouth.   timolol (TIMOPTIC) 0.5 % ophthalmic solution    [DISCONTINUED] benzonatate (TESSALON PERLES) 100 MG capsule Take 1 capsule (100 mg total) by mouth 3 (three) times daily as needed.   [DISCONTINUED] hydrocortisone (ANUSOL-HC) 2.5 % rectal cream Place 1 application rectally 2 (two) times daily.   [DISCONTINUED] nirmatrelvir & ritonavir (PAXLOVID, 300/100,) 20 x 150 MG & 10 x 100MG  TBPK Take 3 tablets by mouth twice daily.   No facility-administered encounter medications on file as of 03/24/2021.    Allergies (verified) Codeine, Grass pollen(k-o-r-t-swt vern), and Pollen extract   History: Past Medical History:  Diagnosis Date   Arthritis    Blood in stool    greater than 4 years since last incidence of blood in stool    Colon polyps    Glaucoma    Hyperlipidemia    Hypertension    Inclusion cyst 2021   face   PNA (pneumonia) 2006   Strain of left wrist 04/16/2019   Past Surgical History:  Procedure Laterality Date   ABSCESS DRAINAGE  2021   face- inclusion cyst   BREAST BIOPSY Left 07/19/2018   09/18/2018 guided breast bx with clip placement- benign   EYE SURGERY  1956   HEMORRHOID SURGERY  2001   RADIOACTIVE SEED GUIDED  EXCISIONAL BREAST BIOPSY Left 03/15/2019   Procedure: RADIOACTIVE SEED GUIDED EXCISIONAL LEFT  BREAST BIOPSY;  Surgeon: Luretha Murphy, MD;  Location: [facility];  Service: General;  Laterality: Left;   TONSILLECTOMY  1963   WISDOM TOOTH EXTRACTION     Family History  Problem Relation Age of Onset   Heart attack Father    Heart attack Sister    Hyperlipidemia Sister    Hypertension Sister    Social History   Socioeconomic History   Marital status: Married    Spouse name: Not on file   Number of  children: Not on file   Years of education: Not on file   Highest education level: Not on file  Occupational History   Not on file  Tobacco Use   Smoking status: Former    Packs/day: 0.00    Years: 15.00    Pack years: 0.00    Types: Cigars, Cigarettes   Smokeless tobacco: Never   Tobacco comments:    *occasional smoker   Vaping Use   Vaping Use: Former  Substance and Sexual Activity   Alcohol use: Yes    Alcohol/week: 4.0 standard drinks    Types: 4 Glasses of wine per week    Comment: *rarely    Drug use: No   Sexual activity: Yes    Partners: Male    Comment: Married  Other Topics Concern   Not on file  Social History Narrative   Married. Retired. 2 children.   Bachelors degree.   Exercises routinely.   Drinks caffeine.   Smoke alarm in the home. Wears her seatbelt. Owns firearms.   Feels safe in her relationships.   Social Determinants of Health   Financial Resource Strain: Low Risk    Difficulty of Paying Living Expenses: Not hard at all  Food Insecurity: Not on file  Transportation Needs: No Transportation Needs   Lack of Transportation (Medical): No   Lack of Transportation (Non-Medical): No  Physical Activity: Inactive   Days of Exercise per Week: 0 days   Minutes of Exercise per Session: 0 min  Stress: No Stress Concern Present   Feeling of Stress : Not at all  Social Connections: Moderately Isolated   Frequency of Communication with Friends and Family: More than three times a week   Frequency of Social Gatherings with Friends and Family: More than three times a week   Attends Religious Services: Never   Database administrator or Organizations: No   Attends Engineer, structural: Never   Marital Status: Married    Tobacco Counseling Counseling given: Not Answered Tobacco comments: *occasional smoker    Clinical Intake:  Pre-visit preparation completed: Yes  Pain : No/denies pain     BMI - recorded: 22.79 Nutritional Status: BMI  of 19-24  Normal Nutritional Risks: None Diabetes: No  How often do you need to have someone help you when you read instructions, pamphlets, or other written materials from your doctor or pharmacy?: 1 - Never  Diabetic?No  Interpreter Needed?: No  Information entered by :: Lanier Ensign, LPN   Activities of Daily Living In your present state of health, do you have any difficulty performing the following activities: 03/24/2021  Hearing? N  Vision? N  Difficulty concentrating or making decisions? N  Walking or climbing stairs? N  Dressing or bathing? N  Doing errands, shopping? N  Preparing Food and eating ? N  Using the Toilet? N  In the past six months, have  you accidently leaked urine? N  Do you have problems with loss of bowel control? N  Managing your Medications? N  Managing your Finances? N  Housekeeping or managing your Housekeeping? N  Some recent data might be hidden    Patient Care Team: Natalia Leatherwood, DO as PCP - General (Family Medicine) Willis Modena, MD as Consulting Physician (Gastroenterology)  Indicate any recent Medical Services you may have received from other than [facility] providers in the past year (date may be approximate).     Assessment:   This is a routine wellness examination for Angela [name].  Hearing/Vision screen Hearing Screening - Comments:: Pt denies any hearing issue  Vision Screening - Comments:: [facility]  for [facility] exams   Dietary issues and exercise activities discussed: Current Exercise Habits: The patient does not participate in regular exercise at present (works in the yard)   Goals Addressed             This Visit's Progress    Patient Stated       Stay active and healthy       Depression Screen PHQ 2/9 Scores 03/24/2021 03/18/2020 01/22/2020 06/19/2019 09/12/2017 04/10/2017  PHQ - 2 Score 0 0 0 0 0 0    Fall Risk Fall Risk  03/24/2021 03/18/2020 01/22/2020 06/19/2019 09/12/2017  Falls in the past year? 0  0 0 0 No  Number falls in past yr: 0 0 0 - -  Injury with Fall? 0 0 0 - -  Risk for fall due to : Impaired vision - - - -  Follow up Falls prevention discussed Falls prevention discussed Falls evaluation completed Falls evaluation completed;Education provided;Falls prevention discussed -    FALL RISK PREVENTION PERTAINING TO THE HOME:  Any stairs in or around the home? Yes  If so, are there any without handrails? No  Home free of loose throw rugs in walkways, pet beds, electrical cords, etc? Yes  Adequate lighting in your home to reduce risk of falls? Yes   ASSISTIVE DEVICES UTILIZED TO PREVENT FALLS:  Life alert? No  Use of a cane, walker or w/c? No  Grab bars in the bathroom? Yes  Shower chair or bench in shower? Yes  Elevated toilet seat or a handicapped toilet? No   TIMED UP AND GO:  Was the test performed? No .   Cognitive Function: declined at this time         Immunizations Immunization History  Administered Date(s) Administered   Fluad Quad(high Dose 65+) 10/23/2020   Influenza, High Dose Seasonal PF 10/14/2016, 11/30/2017, 11/02/2018   Influenza-Unspecified 01/08/2001, 11/13/2001, 11/20/2012, 11/08/2019   PFIZER(Purple Top)SARS-COV-2 Vaccination 03/17/2019, 04/11/2019, 01/19/2020   PNEUMOCOCCAL CONJUGATE-20 10/23/2020   Pneumococcal Conjugate-13 06/19/2019   Td 02/07/1998   Tdap 04/10/2017   Zoster Recombinat (Shingrix) 04/10/2017, 09/12/2017    TDAP status: Up to date  Flu Vaccine status: Up to date  Pneumococcal vaccine status: Up to date  Covid-19 vaccine status: Completed vaccines  Qualifies for Shingles Vaccine? Yes   Zostavax completed Yes   Shingrix Completed?: Yes  Screening Tests Health Maintenance  Topic Date Due   COVID-19 Vaccine (4 - Booster for Pfizer series) 03/15/2020   MAMMOGRAM  03/02/2022   COLONOSCOPY (Pts 45-62yrs Insurance coverage will need to be confirmed)  07/08/2024   TETANUS/TDAP  04/11/2027   DEXA SCAN  05/12/2027    Pneumonia Vaccine 53+ Years old  Completed   INFLUENZA VACCINE  Completed   Hepatitis C  Screening  Completed   Zoster Vaccines- Shingrix  Completed   HPV VACCINES  Aged Out    Health Maintenance  Health Maintenance Due  Topic Date Due   COVID-19 Vaccine (4 - Booster for Pfizer series) 03/15/2020    Colorectal cancer screening: Type of screening: Colonoscopy. Completed 07/09/19. Repeat every 5 years  Mammogram status: Completed 03/02/20. Repeat every year  Bone Density status: Completed 05/11/17. Results reflect: Bone density results: NORMAL. Repeat every 10 years.    Additional Screening:  Hepatitis C Screening:  Completed 04/11/17  Vision Screening: Recommended annual ophthalmology exams for early detection of glaucoma and other disorders of the eye. Is the patient up to date with their annual eye exam?  Yes  Who is the provider or what is the name of the office in which the patient attends annual eye exams? [facility]  If pt is not established with a provider, would they like to be referred to a provider to establish care? No .   Dental Screening: Recommended annual dental exams for proper oral hygiene  Community Resource Referral / Chronic Care Management: CRR required this visit?  No   CCM required this visit?  No      Plan:     I have personally reviewed and noted the following in the patients chart:   Medical and social history Use of alcohol, tobacco or illicit drugs  Current medications and supplements including opioid prescriptions.  Functional ability and status Nutritional status Physical activity Advanced directives List of other physicians Hospitalizations, surgeries, and ER visits in previous 12 months Vitals Screenings to include cognitive, depression, and falls Referrals and appointments  In addition, I have reviewed and discussed with patient certain preventive protocols, quality metrics, and best practice recommendations. A written personalized  care plan for preventive services as well as general preventive health recommendations were provided to patient.     Angela Schleinina H Jennesis Ramaswamy, LPN   1/61/09602/15/2023   Nurse Notes: None

## 2021-04-14 ENCOUNTER — Other Ambulatory Visit: Payer: Self-pay

## 2021-04-14 MED ORDER — LISINOPRIL 5 MG PO TABS: 5.0000 mg | ORAL_TABLET | Freq: Every day | ORAL | 0 refills | Status: DC

## 2021-04-20 ENCOUNTER — Other Ambulatory Visit: Payer: Self-pay | Admitting: Family Medicine

## 2021-05-11 ENCOUNTER — Other Ambulatory Visit: Payer: Self-pay | Admitting: Family Medicine

## 2021-05-24 ENCOUNTER — Other Ambulatory Visit: Payer: Self-pay | Admitting: Family Medicine

## 2021-05-24 ENCOUNTER — Telehealth: Payer: Self-pay

## 2021-05-24 DIAGNOSIS — Z1231 Encounter for screening mammogram for malignant neoplasm of breast: Secondary | ICD-10-CM

## 2021-05-24 MED ORDER — LISINOPRIL 5 MG PO TABS: 5.0000 mg | ORAL_TABLET | Freq: Every day | ORAL | 0 refills | Status: DC

## 2021-05-24 NOTE — Telephone Encounter (Signed)
Patient refill request,  Scheduled appt with Dr. Claiborne Billings for 06/01/21.  Please call in 30 d/s and she will get 90 d/s after her appt with provider.  ? ?Please delete mail order pharmacy as preferred pharmacy. ? ?[facility] [address] and add as her preferred pharmacy effective today 4/17. ? ? ?lisinopril (ZESTRIL) 5 MG tablet [[id number]]  ?

## 2021-05-24 NOTE — Addendum Note (Signed)
Addended by: Maxie Barb on: 05/24/2021 11:10 AM ? ? Modules accepted: Orders ? ?

## 2021-05-24 NOTE — Telephone Encounter (Signed)
Rx sent 

## 2021-06-01 ENCOUNTER — Ambulatory Visit (INDEPENDENT_AMBULATORY_CARE_PROVIDER_SITE_OTHER): Payer: Medicare HMO | Admitting: Family Medicine

## 2021-06-01 ENCOUNTER — Encounter: Payer: Self-pay | Admitting: Family Medicine

## 2021-06-01 VITALS — BP 110/76 | HR 73 | Temp 98.1°F | Ht 64.0 in | Wt 142.0 lb

## 2021-06-01 DIAGNOSIS — E782 Mixed hyperlipidemia: Secondary | ICD-10-CM | POA: Diagnosis not present

## 2021-06-01 DIAGNOSIS — R7309 Other abnormal glucose: Secondary | ICD-10-CM

## 2021-06-01 DIAGNOSIS — I10 Essential (primary) hypertension: Secondary | ICD-10-CM

## 2021-06-01 MED ORDER — LISINOPRIL 5 MG PO TABS: 5.0000 mg | ORAL_TABLET | Freq: Every day | ORAL | 1 refills | Status: DC

## 2021-06-01 MED ORDER — ATORVASTATIN CALCIUM 20 MG PO TABS: 20.0000 mg | ORAL_TABLET | Freq: Every day | ORAL | 3 refills | Status: DC

## 2021-06-01 NOTE — Patient Instructions (Addendum)
?  Great to see you today.  ?I have refilled the medication(s) we provide.  ? ?If labs were collected, we will inform you of lab results once received either by echart message or telephone call.  ? - echart message- for normal results that have been seen by the patient already.  ? - telephone call: abnormal results or if patient has not viewed results in their echart. ? ?Fast next appt - we will update labs.  ? ?Return in about 24 weeks (around 11/16/2021) for Routine chronic condition follow-up. ? ?

## 2021-06-01 NOTE — Progress Notes (Signed)
? ?This visit occurred during the SARS-CoV-2 public health emergency.  Safety protocols were in place, including screening questions prior to the visit, additional usage of staff PPE, and extensive cleaning of exam room while observing appropriate contact time as indicated for disinfecting solutions.  ? ? ?Patient ID: Angela [name], female  DOB: 06-17-1950, 71 y.o.   MRN: [id number] ?Patient Care Team  ?  Relationship Specialty Notifications Start End  ?Ma Hillock, DO PCP - General Family Medicine  04/10/17   ?Arta Silence, MD Consulting Physician Gastroenterology  06/19/19   ? ? ?Chief Complaint  ?Patient presents with  ? Hypertension  ?  F/u  ? ? ?Subjective: ?Angela [name] is a 71 y.o.  Female  present for [facility] ?All past medical history, surgical history, allergies, family history, immunizations, medications and social history were updated in the electronic medical record today. ?All recent labs, ED visits and hospitalizations within the last year were reviewed.  ? ?Hypertension:  ?Pt reportscompliance with lisinopril 5 mg . Patient denies chest pain, shortness of breath, dizziness or lower extremity edema. \Pt does not take a daily baby ASA. Pt is not prescribed statin. ?RF: Hypertension, former smoker, family history heart disease ? ? ? ?  03/24/2021  ?  1:49 PM 03/18/2020  ?  8:17 AM 01/22/2020  ?  8:19 AM 06/19/2019  ?  9:33 AM 09/12/2017  ?  9:59 AM  ?Depression screen PHQ 2/9  ?Decreased Interest 0 0 0 0 0  ?Down, Depressed, Hopeless 0 0 0 0 0  ?PHQ - 2 Score 0 0 0 0 0  ? ?   ? View : No data to display.  ?  ?  ?  ? ? ? ?Immunization History  ?Administered Date(s) Administered  ? Fluad Quad(high Dose 65+) 10/23/2020  ? Influenza, High Dose Seasonal PF 10/14/2016, 11/30/2017, 11/02/2018  ? Influenza-Unspecified 01/08/2001, 11/13/2001, 11/20/2012, 11/08/2019  ? PFIZER(Purple Top)SARS-COV-2 Vaccination 03/17/2019, 04/11/2019, 01/19/2020  ? PNEUMOCOCCAL CONJUGATE-20 10/23/2020  ? Pneumococcal Conjugate-13  06/19/2019  ? Td 02/07/1998  ? Tdap 04/10/2017  ? Zoster Recombinat (Shingrix) 04/10/2017, 09/12/2017  ? ? ?Past Medical History:  ?Diagnosis Date  ? Arthritis   ? Blood in stool   ? greater than 4 years since last incidence of blood in stool   ? Colon polyps   ? Glaucoma   ? Hyperlipidemia   ? Hypertension   ? Inclusion cyst 2021  ? face  ? PNA (pneumonia) 2006  ? Strain of left wrist 04/16/2019  ? ?Allergies  ?Allergen Reactions  ? Codeine Other (See Comments)  ?  Irritable , insomnia   ? Grass Pollen(K-O-R-T-Swt Vern) Other (See Comments)  ?  Trees ?  ? Pollen Extract   ? ?Past Surgical History:  ?Procedure Laterality Date  ? ABSCESS DRAINAGE  2021  ? face- inclusion cyst  ? BREAST BIOPSY Left 07/19/2018  ? US guided breast bx with clip placement- benign  ? EYE SURGERY  1956  ? [place]  2001  ? RADIOACTIVE SEED GUIDED EXCISIONAL BREAST BIOPSY Left 03/15/2019  ? Procedure: RADIOACTIVE SEED GUIDED EXCISIONAL LEFT  BREAST BIOPSY;  Surgeon: Johnathan Hausen, MD;  Location: [facility];  Service: General;  Laterality: Left;  ? TONSILLECTOMY  1963  ? WISDOM TOOTH EXTRACTION    ? ?Family History  ?Problem Relation Age of Onset  ? Heart attack Father   ? Heart attack Sister   ? Hyperlipidemia Sister   ? Hypertension Sister   ? ?  Social History  ? ?Social History Narrative  ? Married. Retired. 2 children.  ? Bachelors degree.  ? Exercises routinely.  ? Drinks caffeine.  ? Smoke alarm in the home. Wears her seatbelt. Owns firearms.  ? Feels safe in her relationships.  ? ? ?Allergies as of 06/01/2021   ? ?   Reactions  ? Codeine Other (See Comments)  ? Irritable , insomnia   ? Grass Pollen(k-o-r-t-swt Vern) Other (See Comments)  ? Trees  ? Pollen Extract   ? ?  ? ?  ?Medication List  ?  ? ?  ? Accurate as of June 01, 2021  9:13 AM. If you have any questions, ask your nurse or doctor.  ?  ?  ? ?  ? ?atorvastatin 20 MG tablet ?Commonly known as: LIPITOR ?Take 1 tablet (20 mg total) by mouth at bedtime. ?   ?cholecalciferol 1000 units tablet ?Commonly known as: VITAMIN D ?Take 1,000 Units by mouth daily. ?  ?DAILY PROBIOTIC PO ?Take by mouth. ?  ?FIBER-CAPS PO ?Take by mouth. ?  ?latanoprost 0.005 % ophthalmic solution ?Commonly known as: XALATAN ?Place 1 drop into both eyes at bedtime. ?  ?Linzess 145 MCG Caps capsule ?Generic drug: linaclotide ?Take 145 mcg by mouth as needed. ?  ?lisinopril 5 MG tablet ?Commonly known as: ZESTRIL ?Take 1 tablet (5 mg total) by mouth daily. ?  ?multivitamin tablet ?Take 1 tablet by mouth daily. ?  ?timolol 0.5 % ophthalmic solution ?Commonly known as: TIMOPTIC ?  ? ?  ? ? ?All past medical history, surgical history, allergies, family history, immunizations andmedications were updated in the EMR today and reviewed under the history and medication portions of their EMR.    ? ? ?ROS: 14 pt review of systems performed and negative (unless mentioned in an HPI) ? ?Objective: ?BP 110/76   Pulse 73   Temp 98.1 ?F (36.7 ?C) (Oral)   Ht 5\' 4"  (1.626 m)   Wt 142 lb (64.4 kg)   SpO2 98%   BMI 24.37 kg/m?  ?Physical Exam ?Vitals and nursing note reviewed.  ?Constitutional:   ?   General: She is not in acute distress. ?   Appearance: Normal appearance. She is not ill-appearing, toxic-appearing or diaphoretic.  ?HENT:  ?   Head: Normocephalic and atraumatic.  ?Eyes:  ?   General: No scleral icterus.    ?   Right eye: No discharge.     ?   Left eye: No discharge.  ?   Extraocular Movements: Extraocular movements intact.  ?   Conjunctiva/sclera: Conjunctivae normal.  ?   Pupils: Pupils are equal, round, and reactive to light.  ?Cardiovascular:  ?   Rate and Rhythm: Normal rate and regular rhythm.  ?   Heart sounds: No murmur heard. ?Pulmonary:  ?   Effort: Pulmonary effort is normal. No respiratory distress.  ?   Breath sounds: Normal breath sounds. No wheezing, rhonchi or rales.  ?Musculoskeletal:  ?   Cervical back: Neck supple. No tenderness.  ?   Right lower leg: No edema.  ?   Left lower  leg: No edema.  ?Lymphadenopathy:  ?   Cervical: No cervical adenopathy.  ?Skin: ?   General: Skin is warm and dry.  ?   Coloration: Skin is not jaundiced or pale.  ?   Findings: No erythema or rash.  ?Neurological:  ?   Mental Status: She is alert and oriented to person, place, and time. Mental status is at baseline.  ?  Motor: No weakness.  ?   Gait: Gait normal.  ?Psychiatric:     ?   Mood and Affect: Mood normal.     ?   Behavior: Behavior normal.     ?   Thought Content: Thought content normal.     ?   Judgment: Judgment normal.  ? ?No results found. ? ?Assessment/plan: ?Angela [name] is a 71 y.o. female present for [facility] ?Essential hypertension ?stable ?-Continue lisinopril 5 mg  ?-Routine exercise encourage ?-Low-sodium heart healthy diet encouraged ?Follow-up 5.5 mos, sooner if blood pressures above goal. ? ?elevated a1c: ?Dietary changes and increase exercise ? ?Leukocytosis:  ?Elevated wbc 13.9>11.9 > likely 2/2 to smoking.   ? ? ?Return in about 24 weeks (around 11/16/2021) for Routine chronic condition follow-up. ? ?No orders of the defined types were placed in this encounter. ? ?Meds ordered this encounter  ?Medications  ? lisinopril (ZESTRIL) 5 MG tablet  ?  Sig: Take 1 tablet (5 mg total) by mouth daily.  ?  Dispense:  90 tablet  ?  Refill:  1  ? atorvastatin (LIPITOR) 20 MG tablet  ?  Sig: Take 1 tablet (20 mg total) by mouth at bedtime.  ?  Dispense:  90 tablet  ?  Refill:  3  ? ? ?Referral Orders  ?No referral(s) requested today  ? ? ? ?Electronically signed by: ?Howard Pouch, DO ?[place] ? ?

## 2021-06-02 ENCOUNTER — Ambulatory Visit
Admission: RE | Admit: 2021-06-02 | Discharge: 2021-06-02 | Disposition: A | Discharge status: Home / Self Care | Payer: Medicare HMO | Source: Ambulatory Visit | Attending: Family Medicine | Admitting: Family Medicine

## 2021-06-02 DIAGNOSIS — Z1231 Encounter for screening mammogram for malignant neoplasm of breast: Secondary | ICD-10-CM | POA: Diagnosis not present

## 2021-06-03 NOTE — Telephone Encounter (Signed)
Spoke with pharmacy who informed me that refill has been received but it is too early to fill. Pharmacy said they will call pt.  ?

## 2021-06-03 NOTE — Telephone Encounter (Signed)
Patient states pharmacy did not receive prescription for Lisinopril ? ?Please resend to [facility] [address] ?

## 2021-06-05 ENCOUNTER — Other Ambulatory Visit: Payer: Self-pay | Admitting: Family Medicine

## 2021-08-05 ENCOUNTER — Ambulatory Visit (INDEPENDENT_AMBULATORY_CARE_PROVIDER_SITE_OTHER): Payer: Medicare HMO | Admitting: Family Medicine

## 2021-08-05 ENCOUNTER — Encounter: Payer: Self-pay | Admitting: Family Medicine

## 2021-08-05 VITALS — BP 112/69 | HR 64 | Temp 98.1°F | Ht 64.0 in | Wt 139.0 lb

## 2021-08-05 DIAGNOSIS — J209 Acute bronchitis, unspecified: Secondary | ICD-10-CM | POA: Diagnosis not present

## 2021-08-05 DIAGNOSIS — R062 Wheezing: Secondary | ICD-10-CM

## 2021-08-05 LAB — POC COVID19 BINAXNOW: SARS Coronavirus 2 Ag: NEGATIVE

## 2021-08-05 LAB — POCT RAPID STREP A (OFFICE): Rapid Strep A Screen: NEGATIVE

## 2021-08-05 MED ORDER — METHYLPREDNISOLONE ACETATE 80 MG/ML IJ SUSP
80.0000 mg | Freq: Once | INTRAMUSCULAR | Status: AC
  Administered 2021-08-05: 80 mg via INTRAMUSCULAR

## 2021-08-05 MED ORDER — PREDNISONE 20 MG PO TABS: ORAL_TABLET | ORAL | 0 refills | Status: DC

## 2021-08-05 MED ORDER — AZITHROMYCIN 250 MG PO TABS: ORAL_TABLET | ORAL | 0 refills | Status: AC

## 2021-08-05 MED ORDER — ALBUTEROL SULFATE HFA 108 (90 BASE) MCG/ACT IN AERS: 2.0000 | INHALATION_SPRAY | Freq: Four times a day (QID) | RESPIRATORY_TRACT | 0 refills | Status: DC | PRN

## 2021-08-05 MED ORDER — CEFDINIR 300 MG PO CAPS: 300.0000 mg | ORAL_CAPSULE | Freq: Two times a day (BID) | ORAL | 0 refills | Status: DC

## 2021-08-05 MED ORDER — ALBUTEROL SULFATE (2.5 MG/3ML) 0.083% IN NEBU
2.5000 mg | INHALATION_SOLUTION | Freq: Once | RESPIRATORY_TRACT | Status: AC
  Administered 2021-08-05: 2.5 mg via RESPIRATORY_TRACT

## 2021-08-05 NOTE — Progress Notes (Signed)
Angela [name] , [birth year], 71 y.o., female MRN: [id number] Patient Care Team    Relationship Specialty Notifications Start End  Natalia Leatherwood, DO PCP - General Family Medicine  04/10/17   Willis Modena, MD Consulting Physician Gastroenterology  06/19/19     Chief Complaint  Patient presents with   Cough    Pt c/o cough, sore throat, ear fullness x 5 days      Subjective: Pt presents for an OV with complaints of productive cough.  She states sometimes she can get the phlegm up but the majority of time she feels in her lungs and does not feel like she can cough up the phlegm.  She is using Mucinex DM.  She noticed a sore throat that started over the weekend.  She feels both ears are full.  She started with generalized fatigue on Friday.  She is eating and drinking well.  She has had some mild chills.  She was around somebody who was ill.    03/24/2021    1:49 PM 03/18/2020    8:17 AM 01/22/2020    8:19 AM 06/19/2019    9:33 AM 09/12/2017    9:59 AM  Depression screen PHQ 2/9  Decreased Interest 0 0 0 0 0  Down, Depressed, Hopeless 0 0 0 0 0  PHQ - 2 Score 0 0 0 0 0    Allergies  Allergen Reactions   Codeine Other (See Comments)    Irritable , insomnia    Grass Pollen(K-O-R-T-Swt Vern) Other (See Comments)    Trees    Pollen Extract    Social History   Social History Narrative   Married. Retired. 2 children.   Bachelors degree.   Exercises routinely.   Drinks caffeine.   Smoke alarm in the home. Wears her seatbelt. Owns firearms.   Feels safe in her relationships.   Past Medical History:  Diagnosis Date   Arthritis    Blood in stool    greater than 4 years since last incidence of blood in stool    Colon polyps    Glaucoma    Hyperlipidemia    Hypertension    Inclusion cyst 2021   face   PNA (pneumonia) 2006   Strain of left wrist 04/16/2019   Past Surgical History:  Procedure Laterality Date   ABSCESS DRAINAGE  2021   face- inclusion cyst   BREAST  BIOPSY Left 07/19/2018   US guided breast bx with clip placement- benign   EYE SURGERY  1956   HEMORRHOID SURGERY  2001   RADIOACTIVE SEED GUIDED EXCISIONAL BREAST BIOPSY Left 03/15/2019   Procedure: RADIOACTIVE SEED GUIDED EXCISIONAL LEFT  BREAST BIOPSY;  Surgeon: Luretha Murphy, MD;  Location: [facility];  Service: General;  Laterality: Left;   TONSILLECTOMY  1963   WISDOM TOOTH EXTRACTION     Family History  Problem Relation Age of Onset   Heart attack Father    Heart attack Sister    Hyperlipidemia Sister    Hypertension Sister    Allergies as of 08/05/2021       Reactions   Codeine Other (See Comments)   Irritable , insomnia    Grass Pollen(k-o-r-t-swt Vern) Other (See Comments)   Trees   Pollen Extract         Medication List        Accurate as of August 05, 2021 12:55 PM. If you have any questions, ask your nurse or doctor.  albuterol 108 (90 Base) MCG/ACT inhaler Commonly known as: VENTOLIN HFA Inhale 2 puffs into the lungs every 6 (six) hours as needed for wheezing or shortness of breath. Started by: Felix Pacini, DO   atorvastatin 20 MG tablet Commonly known as: LIPITOR Take 1 tablet (20 mg total) by mouth at bedtime.   azithromycin 250 MG tablet Commonly known as: ZITHROMAX Take 2 tablets on day 1, then 1 tablet daily on days 2 through 5 Started by: Felix Pacini, DO   cefdinir 300 MG capsule Commonly known as: OMNICEF Take 1 capsule (300 mg total) by mouth 2 (two) times daily. Started by: Felix Pacini, DO   cholecalciferol 1000 units tablet Commonly known as: VITAMIN D Take 1,000 Units by mouth daily.   DAILY PROBIOTIC PO Take by mouth.   FIBER-CAPS PO Take by mouth.   latanoprost 0.005 % ophthalmic solution Commonly known as: XALATAN Place 1 drop into both eyes at bedtime.   Linzess 145 MCG Caps capsule Generic drug: linaclotide Take 145 mcg by mouth as needed.   lisinopril 5 MG tablet Commonly known as:  ZESTRIL Take 1 tablet (5 mg total) by mouth daily.   multivitamin tablet Take 1 tablet by mouth daily.   predniSONE 20 MG tablet Commonly known as: DELTASONE 60 mg x3d, 40 mg x3d, 20 mg x2d, 10 mg x2d Started by: Felix Pacini, DO   timolol 0.5 % ophthalmic solution Commonly known as: TIMOPTIC        All past medical history, surgical history, allergies, family history, immunizations andmedications were updated in the EMR today and reviewed under the history and medication portions of their EMR.     Review of Systems  Constitutional:  Positive for chills and malaise/fatigue. Negative for fever.  HENT:  Positive for congestion, ear pain and sore throat. Negative for ear discharge, nosebleeds and sinus pain.   Eyes:  Negative for discharge and redness.  Respiratory:  Positive for cough, sputum production and wheezing. Negative for shortness of breath and stridor.   Cardiovascular: Negative.   Gastrointestinal:  Negative for abdominal pain, diarrhea, nausea and vomiting.  Skin:  Negative for rash.  Neurological:  Negative for dizziness and headaches.   Negative, with the exception of above mentioned in HPI   Objective:  BP 112/69   Pulse 64   Temp 98.1 F (36.7 C) (Oral)   Ht 5\' 4"  (1.626 m)   Wt 139 lb (63 kg)   SpO2 96%   BMI 23.86 kg/m  Body mass index is 23.86 kg/m. Physical Exam Vitals and nursing note reviewed.  Constitutional:      General: She is not in acute distress.    Appearance: Normal appearance. She is normal weight. She is not ill-appearing or toxic-appearing.  HENT:     Right Ear: Tympanic membrane, ear canal and external ear normal. There is no impacted cerumen.     Left Ear: Tympanic membrane, ear canal and external ear normal. There is no impacted cerumen.     Nose: Congestion and rhinorrhea present.     Mouth/Throat:     Mouth: Mucous membranes are moist.     Pharynx: Posterior oropharyngeal erythema present. No oropharyngeal exudate.  Eyes:      General:        Right eye: No discharge.        Left eye: No discharge.     Extraocular Movements: Extraocular movements intact.     Conjunctiva/sclera: Conjunctivae normal.     Pupils: Pupils are equal,  round, and reactive to light.  Cardiovascular:     Rate and Rhythm: Normal rate and regular rhythm.     Heart sounds: No murmur heard. Pulmonary:     Effort: Pulmonary effort is normal. No respiratory distress.     Breath sounds: No stridor. Wheezing and rhonchi present. No rales.  Chest:     Chest wall: No tenderness.  Musculoskeletal:     Cervical back: Neck supple. No tenderness.     Right lower leg: No edema.     Left lower leg: No edema.  Lymphadenopathy:     Cervical: No cervical adenopathy.  Skin:    Findings: No rash.  Neurological:     Mental Status: She is alert and oriented to person, place, and time. Mental status is at baseline.  Psychiatric:        Mood and Affect: Mood normal.        Behavior: Behavior normal.        Thought Content: Thought content normal.        Judgment: Judgment normal.     No results found. No results found. No results found for this or any previous visit (from the past 24 hour(s)).  Assessment/Plan: ARIYON GERSTENBERGER is a 71 y.o. female present for OV for  Bronchitis greater than 10 days. Negative COVID and negative strep today. Although never formally diagnosed, patient likely has at least mild COPD with her smoking history.  There seems to be a COPD component to her symptoms today. Rest, hydrate.  Continue mucinex (DM if cough), nettie pot or nasal saline.  Wheezing improved with albuterol treatment in the office today.  Patient reports she felt it opened up her airways and she was able to produce phlegm with cough. azithromycin prescribed, take until completed.  Prednisone taper to start tomorrow, IM Depo-Medrol today Omnicef prescription printed.  She will only use this prescription if symptoms are not improving after  completing Z-Pak.  Provided an attempt to keep patient from needing urgent/emergent services during the holiday weekend. If cough present it can last up to 6-8 weeks.  F/U 2 weeks of not improved.   Reviewed expectations re: course of current medical issues. Discussed self-management of symptoms. Outlined signs and symptoms indicating need for more acute intervention. Patient verbalized understanding and all questions were answered. Patient received an After-Visit Summary.    No orders of the defined types were placed in this encounter.  Meds ordered this encounter  Medications   azithromycin (ZITHROMAX) 250 MG tablet    Sig: Take 2 tablets on day 1, then 1 tablet daily on days 2 through 5    Dispense:  6 tablet    Refill:  0   cefdinir (OMNICEF) 300 MG capsule    Sig: Take 1 capsule (300 mg total) by mouth 2 (two) times daily.    Dispense:  20 capsule    Refill:  0   albuterol (VENTOLIN HFA) 108 (90 Base) MCG/ACT inhaler    Sig: Inhale 2 puffs into the lungs every 6 (six) hours as needed for wheezing or shortness of breath.    Dispense:  8 g    Refill:  0    Formulary albuterol inhaler please.   predniSONE (DELTASONE) 20 MG tablet    Sig: 60 mg x3d, 40 mg x3d, 20 mg x2d, 10 mg x2d    Dispense:  18 tablet    Refill:  0   Referral Orders  No referral(s) requested today  Note is dictated utilizing voice recognition software. Although note has been proof read prior to signing, occasional typographical errors still can be missed. If any questions arise, please do not hesitate to call for verification.   electronically signed by:  Howard Pouch, DO  [place]

## 2021-08-05 NOTE — Addendum Note (Signed)
Addended by: Maxie Barb on: 08/05/2021 01:50 PM   Modules accepted: Orders

## 2021-10-26 ENCOUNTER — Encounter: Payer: Self-pay | Admitting: Family Medicine

## 2021-10-26 ENCOUNTER — Ambulatory Visit (INDEPENDENT_AMBULATORY_CARE_PROVIDER_SITE_OTHER): Payer: Medicare HMO

## 2021-10-26 DIAGNOSIS — Z23 Encounter for immunization: Secondary | ICD-10-CM | POA: Diagnosis not present

## 2021-10-27 ENCOUNTER — Ambulatory Visit (INDEPENDENT_AMBULATORY_CARE_PROVIDER_SITE_OTHER): Payer: Medicare HMO | Admitting: Family Medicine

## 2021-10-27 ENCOUNTER — Encounter: Payer: Self-pay | Admitting: Family Medicine

## 2021-10-27 VITALS — BP 139/87 | HR 72 | Temp 98.6°F | Wt 140.0 lb

## 2021-10-27 DIAGNOSIS — S76312A Strain of muscle, fascia and tendon of the posterior muscle group at thigh level, left thigh, initial encounter: Secondary | ICD-10-CM

## 2021-10-27 MED ORDER — DICLOFENAC SODIUM 75 MG PO TBEC: 75.0000 mg | DELAYED_RELEASE_TABLET | Freq: Two times a day (BID) | ORAL | 0 refills | Status: DC

## 2021-10-27 MED ORDER — OMEPRAZOLE 20 MG PO CPDR: 20.0000 mg | DELAYED_RELEASE_CAPSULE | Freq: Every day | ORAL | 3 refills | Status: DC

## 2021-10-27 NOTE — Progress Notes (Unsigned)
Angela [name] , [birth year], 71 y.o., female MRN: [id number] Patient Care Team    Relationship Specialty Notifications Start End  Ma Hillock, DO PCP - General Family Medicine  04/10/17   Arta Silence, MD Consulting Physician Gastroenterology  06/19/19     Chief Complaint  Patient presents with   Hip Pain    Left hip. 2-3 weeks hurts more when sitting. Has been using salonpas patches and it takes the edge off     Subjective: Pt presents for an OV with complaints of pain just underneath her left buttock that has been present for 2 to 3 weeks.  She has tried [employer] which takes the edge off, but has not taken away the pain.  She feels like it is not improving very much over the last 2-3 weeks.  It hurts a great deal when sitting and when transitioning positions.      03/24/2021    1:49 PM 03/18/2020    8:17 AM 01/22/2020    8:19 AM 06/19/2019    9:33 AM 09/12/2017    9:59 AM  Depression screen PHQ 2/9  Decreased Interest 0 0 0 0 0  Down, Depressed, Hopeless 0 0 0 0 0  PHQ - 2 Score 0 0 0 0 0    Allergies  Allergen Reactions   Codeine Other (See Comments)    Irritable , insomnia    Grass Pollen(K-O-R-T-Swt Vern) Other (See Comments)    Trees    Pollen Extract    Social History   Social History Narrative   Married. Retired. 2 children.   Bachelors degree.   Exercises routinely.   Drinks caffeine.   Smoke alarm in the home. Wears her seatbelt. Owns firearms.   Feels safe in her relationships.   Past Medical History:  Diagnosis Date   Arthritis    Blood in stool    greater than 4 years since last incidence of blood in stool    Colon polyps    Glaucoma    Hyperlipidemia    Hypertension    Inclusion cyst 2021   face   PNA (pneumonia) 2006   Strain of left wrist 04/16/2019   Past Surgical History:  Procedure Laterality Date   ABSCESS DRAINAGE  2021   face- inclusion cyst   BREAST BIOPSY Left 07/19/2018   US guided breast bx with clip placement-  benign   EYE SURGERY  1956   HEMORRHOID SURGERY  2001   RADIOACTIVE SEED GUIDED EXCISIONAL BREAST BIOPSY Left 03/15/2019   Procedure: RADIOACTIVE SEED GUIDED EXCISIONAL LEFT  BREAST BIOPSY;  Surgeon: Johnathan Hausen, MD;  Location: [facility];  Service: General;  Laterality: Left;   TONSILLECTOMY  1963   WISDOM TOOTH EXTRACTION     Family History  Problem Relation Age of Onset   Heart attack Father    Heart attack Sister    Hyperlipidemia Sister    Hypertension Sister    Allergies as of 10/27/2021       Reactions   Codeine Other (See Comments)   Irritable , insomnia    Grass Pollen(k-o-r-t-swt Vern) Other (See Comments)   Trees   Pollen Extract         Medication List        Accurate as of October 27, 2021 11:59 PM. If you have any questions, ask your nurse or doctor.          STOP taking these medications    cefdinir 300  MG capsule Commonly known as: OMNICEF Stopped by: Felix Pacini, DO   predniSONE 20 MG tablet Commonly known as: DELTASONE Stopped by: Felix Pacini, DO       TAKE these medications    albuterol 108 (90 Base) MCG/ACT inhaler Commonly known as: VENTOLIN HFA Inhale 2 puffs into the lungs every 6 (six) hours as needed for wheezing or shortness of breath.   atorvastatin 20 MG tablet Commonly known as: LIPITOR Take 1 tablet (20 mg total) by mouth at bedtime.   cholecalciferol 1000 units tablet Commonly known as: VITAMIN D Take 1,000 Units by mouth daily.   DAILY PROBIOTIC PO Take by mouth.   diclofenac 75 MG EC tablet Commonly known as: VOLTAREN Take 1 tablet (75 mg total) by mouth 2 (two) times daily. Started by: Felix Pacini, DO   FIBER-CAPS PO Take by mouth.   latanoprost 0.005 % ophthalmic solution Commonly known as: XALATAN Place 1 drop into both eyes at bedtime.   Linzess 145 MCG Caps capsule Generic drug: linaclotide Take 145 mcg by mouth as needed.   lisinopril 5 MG tablet Commonly known as:  ZESTRIL Take 1 tablet (5 mg total) by mouth daily.   multivitamin tablet Take 1 tablet by mouth daily.   omeprazole 20 MG capsule Commonly known as: PRILOSEC Take 1 capsule (20 mg total) by mouth daily. Started by: Felix Pacini, DO   timolol 0.5 % ophthalmic solution Commonly known as: TIMOPTIC        All past medical history, surgical history, allergies, family history, immunizations andmedications were updated in the EMR today and reviewed under the history and medication portions of their EMR.     ROS Negative, with the exception of above mentioned in HPI   Objective:  BP 139/87   Pulse 72   Temp 98.6 F (37 C)   Wt 140 lb (63.5 kg)   SpO2 98%   BMI 24.03 kg/m  Body mass index is 24.03 kg/m. Physical Exam Vitals and nursing note reviewed.  Constitutional:      General: She is not in acute distress.    Appearance: Normal appearance. She is normal weight. She is not ill-appearing or toxic-appearing.  Eyes:     Extraocular Movements: Extraocular movements intact.     Conjunctiva/sclera: Conjunctivae normal.     Pupils: Pupils are equal, round, and reactive to light.  Musculoskeletal:        General: Tenderness present. No swelling, deformity or signs of injury.     Right upper leg: Normal.     Left upper leg: Tenderness present. No swelling, edema, deformity, lacerations or bony tenderness.       Legs:  Neurological:     Mental Status: She is alert and oriented to person, place, and time. Mental status is at baseline.  Psychiatric:        Mood and Affect: Mood normal.        Behavior: Behavior normal.        Thought Content: Thought content normal.        Judgment: Judgment normal.      No results found. No results found. No results found for this or any previous visit (from the past 24 hour(s)).  Assessment/Plan: Angela [name] is a 71 y.o. female present for OV for  Hamstring strain, left, initial encounter Patient exam is consistent with left  hamstring strain. Start diclofenac 1-2 times daily with Prilosec daily for 2 to 4 weeks. Discussed benefits of physical therapy and she is  agreeable to referral today. - Ambulatory referral to Physical Therapy Follow-up in 4 weeks if symptoms are not improving, sooner if worsening  Reviewed expectations re: course of current medical issues. Discussed self-management of symptoms. Outlined signs and symptoms indicating need for more acute intervention. Patient verbalized understanding and all questions were answered. Patient received an After-Visit Summary.    Orders Placed This Encounter  Procedures   Ambulatory referral to Physical Therapy   Meds ordered this encounter  Medications   diclofenac (VOLTAREN) 75 MG EC tablet    Sig: Take 1 tablet (75 mg total) by mouth 2 (two) times daily.    Dispense:  30 tablet    Refill:  0   omeprazole (PRILOSEC) 20 MG capsule    Sig: Take 1 capsule (20 mg total) by mouth daily.    Dispense:  30 capsule    Refill:  3   Referral Orders         Ambulatory referral to Physical Therapy       Note is dictated utilizing voice recognition software. Although note has been proof read prior to signing, occasional typographical errors still can be missed. If any questions arise, please do not hesitate to call for verification.   electronically signed by:  Felix Pacini, DO  [facility] [place]

## 2021-10-27 NOTE — Patient Instructions (Signed)
Hamstring Strain  A hamstring strain happens when the muscles in the back of the thighs (hamstring muscles) are overstretched or torn. The hamstring muscles are used in straightening the hips, bending the knees, and pulling back the legs. This injury is often called a pulled hamstring muscle. The tissue that connects the muscle to a bone (tendon) may also be affected. The severity of a hamstring strain may be rated in degrees or grades. First-degree (or grade 1) strains have the least amount of muscle tearing and pain. Second-degree and third-degree (grade 2 and 3) strains have increasingly more tearing and pain. What are the causes? This condition is caused by a sudden, violent force being placed on the hamstring muscles, stretching them too far. This often happens during activities that involve sprinting, jumping, kicking, or weight lifting. What increases the risk? Hamstring strains are especially common in athletes. The following factors may also make you more likely to develop this condition: Having low strength, endurance, or flexibility of the hamstring muscles. Doing high-impact physical activity or sports. Having poor physical fitness. Having a previous leg injury. Having tired (fatigued) muscles. Having a previous hamstring strain. What are the signs or symptoms? Symptoms of this condition include: Pain in the back of the thigh or buttocks. Swelling. Bruising. Muscle spasms. Trouble moving the affected muscle because of pain. For severe strains, you may feel popping or snapping in the back of your thigh when the injury occurs. How is this diagnosed? This condition is diagnosed based on your symptoms, your medical history, and a physical exam. You may also have imaging tests, such as an MRI or X-rays. Your strain may be rated based on how severe it is. The ratings are: Grade 1 strain (mild). Muscles are overstretched. There may be very small muscle tears. Grade 2 strain  (moderate). Muscles are partially torn. Grade 3 strain (severe). Muscles are completely torn. How is this treated? Treatment for this condition usually involves: Protecting, resting, icing, applying compression, and elevating the injured area (PRICE therapy). Medicines. Your health care provider may recommend medicines to help reduce pain or inflammation. Doing exercises to regain strength and flexibility in the muscles. Your health care provider will tell you when it is okay to begin exercising. Hamstring strains may take a long time to heal. This type of strain may happen again in athletes. Follow your health care provider's advice about when to return to sports-related activities. Follow these instructions at home: PRICE therapy Use PRICE therapy to promote muscle healing during the first 2-3 days after your injury, or as told by your health care provider. Protect the muscle from being injured again. Rest your injury. This usually involves limiting your normal activities and not using the injured hamstring muscle. Talk with your health care provider about how you should limit your activities. Apply ice to the injured area: Put ice in a plastic bag. Place a towel between your skin and the bag. Leave the ice on for 20 minutes, 2-3 times a day. After the third day, switch to applying heat as told. Put pressure (compression) on your injured hamstring by wrapping it with an elastic bandage. Be careful not to wrap it too tightly. That may interfere with blood circulation or may increase swelling. Raise (elevate) your injured hamstring above the level of your heart as often as possible. When you are lying down, you can do this by putting a pillow under your thigh.  Activity Begin exercising or stretching only as told by your health care   provider. Do not return to full activity level until your health care provider approves. To help prevent muscle strains in the future, always warm up before  exercising and stretch afterward. General instructions Take over-the-counter and prescription medicines only as told by your health care provider. If directed, apply heat to the affected area as often as told by your health care provider. Use the heat source that your health care provider recommends, such as a moist heat pack or a heating pad. Place a towel between your skin and the heat source. Leave the heat on for 20-30 minutes. Remove the heat if your skin turns bright red. This is especially important if you are unable to feel pain, heat, or cold. You may have a greater risk of getting burned. Keep all follow-up visits. This is important. Contact a health care provider if: You have increasing pain or swelling in the injured area. You have numbness, tingling, or a significant loss of strength in the injured area. Get help right away if: Your foot or your toes become cold or turn blue. Summary A hamstring strain happens when the muscles in the back of the thighs (hamstring muscles) are overstretched or torn. This injury can be caused by a sudden, violent force being placed on the hamstring muscles, causing them to stretch too far. Symptoms include pain, swelling, and muscle spasms in the injured area. Treatment includes PRICE therapy: protecting, resting, icing, applying compression, and elevating the injured area. This information is not intended to replace advice given to you by your health care provider. Make sure you discuss any questions you have with your health care provider. Document Revised: 06/25/2020 Document Reviewed: 06/25/2020 Elsevier Patient Education  2023 [employer]   Hamstring Strain Rehab Ask your health care provider which exercises are safe for you. Do exercises exactly as told by your health care provider and adjust them as directed. It is normal to feel mild stretching, pulling, tightness, or discomfort as you do these exercises. Stop right away if you feel  sudden pain or your pain gets worse. Do not begin these exercises until told by your health care provider. Stretching and range-of-motion exercises These exercises warm up your muscles and joints and improve the movement and flexibility of your thighs. These exercises also help to relieve pain, numbness, and tingling. Talk to your health care provider about these restrictions. Knee extension, seated  Sit with your left / right heel propped on a chair, a coffee table, or a footstool. Do not have anything under your knee to support it. Allow your leg muscles to relax, letting gravity straighten out your knee (extension). You should feel a stretch behind your left / right knee. If told by your health care provider, deepen the stretch by placing a __________ weight on your thigh, just above your kneecap. Hold this position for __________ seconds. Repeat __________ times. Complete this exercise __________ times a day. Seated stretch This exercise is sometimes called hamstrings and adductors stretch. Sit on the floor with your legs stretched wide. Keep your knees straight during this exercise. Keeping your head and back in a straight line, bend at your waist to reach for your left foot. You should feel a stretch in your right inner thigh (adductors). Hold this position for __________ seconds. Then slowly return to the upright position. Keeping your head and back in a straight line, bend at your waist to reach forward. You should feel a stretch behind both of your thighs or knees (hamstrings). Hold this  position for __________ seconds. Then slowly return to the upright position. Keeping your head and back in a straight line, bend at your waist to reach for your right foot. You should feel a stretch in your left inner thigh (adductors). Hold this position for __________ seconds. Then slowly return to the upright position. Repeat __________ times. Complete this exercise __________ times a day. Hamstrings  stretch, supine  Lie on your back (supine position). Loop a belt or towel over the ball of your left / right foot. The ball of your foot is on the walking surface, right under your toes. Straighten your left / right knee and slowly pull on the belt or towel to raise your leg. Do not let your left / right knee bend while you do this. Keep your other leg flat on the floor. Raise the left / right leg until you feel a gentle stretch behind your left / right knee or thigh (hamstrings). Hold this position for __________ seconds. Slowly return your leg to the starting position. Repeat __________ times. Complete this exercise __________ times a day. Strengthening exercises These exercises build strength and endurance in your thighs. Endurance is the ability to use your muscles for a long time, even after they get tired. Straight leg raises, prone This exercise strengthens the muscles that move the hips (hip extensors). Lie on your abdomen on a firm surface (prone position). Tense the muscles in your buttocks and lift your left / right leg about 4 inches (10 cm). Keep your knee straight as you lift your leg. If you cannot lift your leg that high without arching your back, place a pillow under your hips. Hold the position for __________ seconds. Slowly lower your leg to the starting position. Allow your muscles to relax completely before you start the next repetition. Repeat __________ times. Complete this exercise __________ times a day. Bridge This exercise strengthens the muscles in your buttocks and the back of your thighs (hip extensors). Lie on your back on a firm surface with your knees bent and your feet flat on the floor. Tighten your buttocks muscles and lift your bottom off the floor until the trunk of your body is level with your thighs. You should feel the muscles working in your buttocks and the back of your thighs. Do not arch your back. Hold this position for __________  seconds. Slowly lower your hips to the starting position. Let your buttocks muscles relax completely between repetitions. If told by your health care provider, keep your bottom lifted off the floor while you slowly walk your feet away from you as far as you can control. Hold for __________ seconds, then slowly walk your feet back toward you. Repeat __________ times. Complete this exercise __________ times a day. Lateral walking with band This is an exercise in which you walk sideways (lateral), with tension provided by an exercise band. The exercise strengthens the muscles in your hip (hip abductors). Stand in a long hallway. Wrap a loop of exercise band around your legs, just above your knees. Bend your knees gently and drop your hips down and back so your weight is over your heels. Step to the side to move down the length of the hallway, keeping your toes pointed ahead of you and keeping tension in the band. Repeat, leading with your other leg. Repeat __________ times. Complete this exercise __________ times a day. Single leg stand with reaching This exercise is also called eccentric hamstring stretch. Stand on your left / right  foot. Keep your big toe down on the floor and try to keep your arch lifted. Slowly reach down toward the floor as far as you can while keeping your balance. Lowering your thigh under tension is called eccentric stretching. Hold this position for __________ seconds. Repeat __________ times. Complete this exercise __________ times a day. Plank, prone This exercise strengthens muscles in your abdomen and core area. Lie on your abdomen on the floor (prone position),and prop yourself up on your elbows. Your hands should be straight out in front of you, and your elbows should be below your shoulders. Position your feet similar to a push-up position so your toes are on the ground. Tighten your abdominal muscles and lift your body off the floor. Do not arch your back. Do  not hold your breath. Hold this position for __________ seconds. Repeat __________ times. Complete this exercise __________ times a day. This information is not intended to replace advice given to you by your health care provider. Make sure you discuss any questions you have with your health care provider. Document Revised: 07/20/2020 Document Reviewed: 07/20/2020 Elsevier Patient Education  2023 [employer].

## 2021-10-28 ENCOUNTER — Encounter: Payer: Self-pay | Admitting: Family Medicine

## 2021-11-17 DIAGNOSIS — M545 Low back pain, unspecified: Secondary | ICD-10-CM | POA: Diagnosis not present

## 2021-11-23 DIAGNOSIS — M545 Low back pain, unspecified: Secondary | ICD-10-CM | POA: Diagnosis not present

## 2021-11-25 DIAGNOSIS — M545 Low back pain, unspecified: Secondary | ICD-10-CM | POA: Diagnosis not present

## 2021-11-30 ENCOUNTER — Encounter: Payer: Self-pay | Admitting: Family Medicine

## 2021-11-30 ENCOUNTER — Ambulatory Visit (INDEPENDENT_AMBULATORY_CARE_PROVIDER_SITE_OTHER): Payer: Medicare HMO | Admitting: Family Medicine

## 2021-11-30 VITALS — BP 132/85 | HR 75 | Temp 98.1°F | Wt 141.0 lb

## 2021-11-30 DIAGNOSIS — E782 Mixed hyperlipidemia: Secondary | ICD-10-CM

## 2021-11-30 DIAGNOSIS — D72829 Elevated white blood cell count, unspecified: Secondary | ICD-10-CM | POA: Diagnosis not present

## 2021-11-30 DIAGNOSIS — I10 Essential (primary) hypertension: Secondary | ICD-10-CM | POA: Diagnosis not present

## 2021-11-30 DIAGNOSIS — R7309 Other abnormal glucose: Secondary | ICD-10-CM | POA: Diagnosis not present

## 2021-11-30 DIAGNOSIS — M545 Low back pain, unspecified: Secondary | ICD-10-CM | POA: Diagnosis not present

## 2021-11-30 LAB — LIPID PANEL
Cholesterol: 183 mg/dL (ref 0–200)
HDL: 62.7 mg/dL (ref >39.00)
LDL Cholesterol: 101 mg/dL — ABNORMAL HIGH (ref 0–99)
NonHDL: 120.6
Total CHOL/HDL Ratio: 3
Triglycerides: 97 mg/dL (ref 0.0–149.0)
VLDL: 19.4 mg/dL (ref 0.0–40.0)

## 2021-11-30 LAB — COMPREHENSIVE METABOLIC PANEL
ALT: 12 U/L (ref 0–35)
AST: 15 U/L (ref 0–37)
Albumin: 4.1 g/dL (ref 3.5–5.2)
Alkaline Phosphatase: 64 U/L (ref 39–117)
BUN: 13 mg/dL (ref 6–23)
CO2: 32 mEq/L (ref 19–32)
Calcium: 9 mg/dL (ref 8.4–10.5)
Chloride: 103 mEq/L (ref 96–112)
Creatinine, Ser: 0.65 mg/dL (ref 0.40–1.20)
GFR: 90.08 mL/min (ref >60.00)
Glucose, Bld: 83 mg/dL (ref 70–99)
Potassium: 3.8 mEq/L (ref 3.5–5.1)
Sodium: 140 mEq/L (ref 135–145)
Total Bilirubin: 0.4 mg/dL (ref 0.2–1.2)
Total Protein: 6.5 g/dL (ref 6.0–8.3)

## 2021-11-30 LAB — CBC WITH DIFFERENTIAL/PLATELET
Basophils Absolute: 0.1 10*3/uL (ref 0.0–0.1)
Basophils Relative: 0.7 % (ref 0.0–3.0)
Eosinophils Absolute: 0.3 10*3/uL (ref 0.0–0.7)
Eosinophils Relative: 2.4 % (ref 0.0–5.0)
HCT: 40.9 % (ref 36.0–46.0)
Hemoglobin: 13.5 g/dL (ref 12.0–15.0)
Lymphocytes Relative: 19.8 % (ref 12.0–46.0)
Lymphs Abs: 2.1 10*3/uL (ref 0.7–4.0)
MCHC: 32.9 g/dL (ref 30.0–36.0)
MCV: 92.3 fl (ref 78.0–100.0)
Monocytes Absolute: 0.6 10*3/uL (ref 0.1–1.0)
Monocytes Relative: 5.4 % (ref 3.0–12.0)
Neutro Abs: 7.5 10*3/uL (ref 1.4–7.7)
Neutrophils Relative %: 71.7 % (ref 43.0–77.0)
Platelets: 411 10*3/uL — ABNORMAL HIGH (ref 150.0–400.0)
RBC: 4.44 Mil/uL (ref 3.87–5.11)
RDW: 14 % (ref 11.5–15.5)
WBC: 10.5 10*3/uL (ref 4.0–10.5)

## 2021-11-30 LAB — HEMOGLOBIN A1C: Hgb A1c MFr Bld: 6 % (ref 4.6–6.5)

## 2021-11-30 LAB — TSH: TSH: 0.59 u[IU]/mL (ref 0.35–5.50)

## 2021-11-30 MED ORDER — OMEPRAZOLE 20 MG PO CPDR: 20.0000 mg | DELAYED_RELEASE_CAPSULE | Freq: Every day | ORAL | 3 refills | Status: DC

## 2021-11-30 MED ORDER — ATORVASTATIN CALCIUM 20 MG PO TABS: 20.0000 mg | ORAL_TABLET | Freq: Every day | ORAL | 3 refills | Status: DC

## 2021-11-30 MED ORDER — LISINOPRIL 10 MG PO TABS: 10.0000 mg | ORAL_TABLET | Freq: Every day | ORAL | 1 refills | Status: DC

## 2021-11-30 MED ORDER — DICLOFENAC SODIUM 75 MG PO TBEC: 75.0000 mg | DELAYED_RELEASE_TABLET | Freq: Two times a day (BID) | ORAL | 1 refills | Status: DC

## 2021-11-30 NOTE — Progress Notes (Signed)
Patient ID: Angela [name], female  DOB: 06-27-1950, 71 y.o.   MRN: [id number] Patient Care Team    Relationship Specialty Notifications Start End  Ma Hillock, DO PCP - General Family Medicine  04/10/17   Arta Silence, MD Consulting Physician Gastroenterology  06/19/19     Chief Complaint  Patient presents with   Hypertension    Pt is not fasting    Subjective: Angela [name] is a 71 y.o.  Female  present for [facility] All past medical history, surgical history, allergies, family history, immunizations, medications and social history were updated in the electronic medical record today. All recent labs, ED visits and hospitalizations within the last year were reviewed.   Hypertension:  Pt reports compliance with lisinopril 5 mg . Patient denies chest pain, shortness of breath, dizziness or lower extremity edema.  Pt is not prescribed statin. RF: Hypertension, former smoker, family history heart disease      03/24/2021    1:49 PM 03/18/2020    8:17 AM 01/22/2020    8:19 AM 06/19/2019    9:33 AM 09/12/2017    9:59 AM  Depression screen PHQ 2/9  Decreased Interest 0 0 0 0 0  Down, Depressed, Hopeless 0 0 0 0 0  PHQ - 2 Score 0 0 0 0 0       No data to display           Immunization History  Administered Date(s) Administered   Fluad Quad(high Dose 65+) 10/23/2020, 10/26/2021   Influenza, High Dose Seasonal PF 10/14/2016, 11/30/2017, 11/02/2018   Influenza-Unspecified 01/08/2001, 11/13/2001, 11/20/2012, 11/08/2019   PFIZER(Purple Top)SARS-COV-2 Vaccination 03/17/2019, 04/11/2019, 01/19/2020   PNEUMOCOCCAL CONJUGATE-20 10/23/2020   Pneumococcal Conjugate-13 06/19/2019   Td 02/07/1998   Tdap 04/10/2017   Zoster Recombinat (Shingrix) 04/10/2017, 09/12/2017    Past Medical History:  Diagnosis Date   Arthritis    Blood in stool    greater than 4 years since last incidence of blood in stool    Colon polyps    Glaucoma    Hyperlipidemia    Hypertension     Inclusion cyst 2021   face   PNA (pneumonia) 2006   Strain of left wrist 04/16/2019   Allergies  Allergen Reactions   Codeine Other (See Comments)    Irritable , insomnia    Grass Pollen(K-O-R-T-Swt Vern) Other (See Comments)    Trees    Pollen Extract    Past Surgical History:  Procedure Laterality Date   ABSCESS DRAINAGE  2021   face- inclusion cyst   BREAST BIOPSY Left 07/19/2018   US guided breast bx with clip placement- benign   EYE SURGERY  1956   HEMORRHOID SURGERY  2001   RADIOACTIVE SEED GUIDED EXCISIONAL BREAST BIOPSY Left 03/15/2019   Procedure: RADIOACTIVE SEED GUIDED EXCISIONAL LEFT  BREAST BIOPSY;  Surgeon: Johnathan Hausen, MD;  Location: [place];  Service: General;  Laterality: Left;   TONSILLECTOMY  1963   WISDOM TOOTH EXTRACTION     Family History  Problem Relation Age of Onset   Heart attack Father    Heart attack Sister    Hyperlipidemia Sister    Hypertension Sister    Social History   Social History Narrative   Married. Retired. 2 children.   Bachelors degree.   Exercises routinely.   Drinks caffeine.   Smoke alarm in the home. Wears her seatbelt. Owns firearms.   Feels safe in her relationships.    Allergies  as of 11/30/2021       Reactions   Codeine Other (See Comments)   Irritable , insomnia    Grass Pollen(k-o-r-t-swt Vern) Other (See Comments)   Trees   Pollen Extract         Medication List        Accurate as of November 30, 2021  8:36 AM. If you have any questions, ask your nurse or doctor.          albuterol 108 (90 Base) MCG/ACT inhaler Commonly known as: VENTOLIN HFA Inhale 2 puffs into the lungs every 6 (six) hours as needed for wheezing or shortness of breath.   atorvastatin 20 MG tablet Commonly known as: LIPITOR Take 1 tablet (20 mg total) by mouth at bedtime.   cholecalciferol 1000 units tablet Commonly known as: VITAMIN D Take 1,000 Units by mouth daily.   DAILY PROBIOTIC PO Take by  mouth.   diclofenac 75 MG EC tablet Commonly known as: VOLTAREN Take 1 tablet (75 mg total) by mouth 2 (two) times daily.   FIBER-CAPS PO Take by mouth.   latanoprost 0.005 % ophthalmic solution Commonly known as: XALATAN Place 1 drop into both eyes at bedtime.   Linzess 145 MCG Caps capsule Generic drug: linaclotide Take 145 mcg by mouth as needed.   lisinopril 10 MG tablet Commonly known as: ZESTRIL Take 1 tablet (10 mg total) by mouth daily. What changed:  medication strength how much to take Changed by: Howard Pouch, DO   multivitamin tablet Take 1 tablet by mouth daily.   omeprazole 20 MG capsule Commonly known as: PRILOSEC Take 1 capsule (20 mg total) by mouth daily.   timolol 0.5 % ophthalmic solution Commonly known as: TIMOPTIC        All past medical history, surgical history, allergies, family history, immunizations andmedications were updated in the EMR today and reviewed under the history and medication portions of their EMR.      ROS: 14 pt review of systems performed and negative (unless mentioned in an HPI)  Objective: BP 132/85   Pulse 75   Temp 98.1 F (36.7 C)   Wt 141 lb (64 kg)   SpO2 99%   BMI 24.20 kg/m  Physical Exam Vitals and nursing note reviewed.  Constitutional:      General: She is not in acute distress.    Appearance: Normal appearance. She is not ill-appearing, toxic-appearing or diaphoretic.  HENT:     Head: Normocephalic and atraumatic.  Eyes:     General: No scleral icterus.       Right eye: No discharge.        Left eye: No discharge.     Extraocular Movements: Extraocular movements intact.     Conjunctiva/sclera: Conjunctivae normal.     Pupils: Pupils are equal, round, and reactive to light.  Cardiovascular:     Rate and Rhythm: Normal rate and regular rhythm.     Heart sounds: No murmur heard. Pulmonary:     Effort: Pulmonary effort is normal. No respiratory distress.     Breath sounds: Normal breath sounds.  No wheezing, rhonchi or rales.  Musculoskeletal:     Cervical back: Neck supple. No tenderness.     Right lower leg: No edema.     Left lower leg: No edema.  Lymphadenopathy:     Cervical: No cervical adenopathy.  Skin:    General: Skin is warm and dry.     Coloration: Skin is not jaundiced or pale.  Findings: No erythema or rash.  Neurological:     Mental Status: She is alert and oriented to person, place, and time. Mental status is at baseline.     Motor: No weakness.     Gait: Gait normal.  Psychiatric:        Mood and Affect: Mood normal.        Behavior: Behavior normal.        Thought Content: Thought content normal.        Judgment: Judgment normal.    No results found.  Assessment/plan: JOLA CRITZER is a 71 y.o. female present for [facility] Essential hypertension Stable increase  lisinopril 10 mg  -Routine exercise encourage -Low-sodium heart healthy diet encouraged Cbc, cmp, tsh, lipids Follow-up 5.5 mos, sooner if blood pressures above goal.  elevated a1c: Dietary changes and increase exercise A1c collected   Leukocytosis:  Elevated wbc 13.9>11.9 > likely 2/2 to smoking.   Follow yearly, unless rising above her baseline  Back pain: Stable Restart diclofenac  Return in about 24 weeks (around 05/17/2022) for Routine chronic condition follow-up.  Orders Placed This Encounter  Procedures   CBC w/Diff   Comp Met (CMET)   TSH   Lipid panel   Hemoglobin A1c   Meds ordered this encounter  Medications   atorvastatin (LIPITOR) 20 MG tablet    Sig: Take 1 tablet (20 mg total) by mouth at bedtime.    Dispense:  90 tablet    Refill:  3   diclofenac (VOLTAREN) 75 MG EC tablet    Sig: Take 1 tablet (75 mg total) by mouth 2 (two) times daily.    Dispense:  90 tablet    Refill:  1   lisinopril (ZESTRIL) 10 MG tablet    Sig: Take 1 tablet (10 mg total) by mouth daily.    Dispense:  90 tablet    Refill:  1   omeprazole (PRILOSEC) 20 MG capsule    Sig: Take 1  capsule (20 mg total) by mouth daily.    Dispense:  90 capsule    Refill:  3    Referral Orders  No referral(s) requested today     Electronically signed by: Howard Pouch, [place]

## 2021-11-30 NOTE — Patient Instructions (Signed)
Return in about 24 weeks (around 05/17/2022) for Routine chronic condition follow-up.        Great to see you today.  I have refilled the medication(s) we provide.   If labs were collected, we will inform you of lab results once received either by echart message or telephone call.   - echart message- for normal results that have been seen by the patient already.   - telephone call: abnormal results or if patient has not viewed results in their echart.

## 2022-01-07 DIAGNOSIS — M545 Low back pain, unspecified: Secondary | ICD-10-CM | POA: Diagnosis not present

## 2022-01-13 DIAGNOSIS — M545 Low back pain, unspecified: Secondary | ICD-10-CM | POA: Diagnosis not present

## 2022-01-19 DIAGNOSIS — M545 Low back pain, unspecified: Secondary | ICD-10-CM | POA: Diagnosis not present

## 2022-01-26 DIAGNOSIS — M545 Low back pain, unspecified: Secondary | ICD-10-CM | POA: Diagnosis not present

## 2022-02-02 DIAGNOSIS — M545 Low back pain, unspecified: Secondary | ICD-10-CM | POA: Diagnosis not present

## 2022-02-10 DIAGNOSIS — M545 Low back pain, unspecified: Secondary | ICD-10-CM | POA: Diagnosis not present

## 2022-02-11 DIAGNOSIS — M545 Low back pain, unspecified: Secondary | ICD-10-CM | POA: Diagnosis not present

## 2022-02-15 DIAGNOSIS — M545 Low back pain, unspecified: Secondary | ICD-10-CM | POA: Diagnosis not present

## 2022-02-23 DIAGNOSIS — M545 Low back pain, unspecified: Secondary | ICD-10-CM | POA: Diagnosis not present

## 2022-03-03 ENCOUNTER — Ambulatory Visit (INDEPENDENT_AMBULATORY_CARE_PROVIDER_SITE_OTHER): Payer: Medicare HMO | Admitting: Family Medicine

## 2022-03-03 ENCOUNTER — Encounter: Payer: Self-pay | Admitting: Family Medicine

## 2022-03-03 VITALS — BP 108/69 | HR 70 | Temp 98.7°F | Wt 140.0 lb

## 2022-03-03 DIAGNOSIS — R051 Acute cough: Secondary | ICD-10-CM

## 2022-03-03 DIAGNOSIS — B9689 Other specified bacterial agents as the cause of diseases classified elsewhere: Secondary | ICD-10-CM | POA: Diagnosis not present

## 2022-03-03 DIAGNOSIS — J988 Other specified respiratory disorders: Secondary | ICD-10-CM | POA: Diagnosis not present

## 2022-03-03 LAB — POC COVID19 BINAXNOW: SARS Coronavirus 2 Ag: NEGATIVE

## 2022-03-03 LAB — POCT RAPID STREP A (OFFICE): Rapid Strep A Screen: NEGATIVE

## 2022-03-03 MED ORDER — BENZONATATE 200 MG PO CAPS: 200.0000 mg | ORAL_CAPSULE | Freq: Two times a day (BID) | ORAL | 0 refills | Status: DC | PRN

## 2022-03-03 MED ORDER — CEFDINIR 300 MG PO CAPS: 300.0000 mg | ORAL_CAPSULE | Freq: Two times a day (BID) | ORAL | 0 refills | Status: DC

## 2022-03-03 MED ORDER — PREDNISONE 20 MG PO TABS: ORAL_TABLET | ORAL | 0 refills | Status: DC

## 2022-03-03 MED ORDER — METHYLPREDNISOLONE ACETATE 80 MG/ML IJ SUSP
80.0000 mg | Freq: Once | INTRAMUSCULAR | Status: AC
  Administered 2022-03-03: 80 mg via INTRAMUSCULAR

## 2022-03-03 MED ORDER — ALBUTEROL SULFATE HFA 108 (90 BASE) MCG/ACT IN AERS: 2.0000 | INHALATION_SPRAY | Freq: Four times a day (QID) | RESPIRATORY_TRACT | 0 refills | Status: AC | PRN

## 2022-03-03 NOTE — Progress Notes (Signed)
Angela [name] , [birth year], 72 y.o., female MRN: [id number] Patient Care Team    Relationship Specialty Notifications Start End  Ma Hillock, DO PCP - General Family Medicine  04/10/17   Arta Silence, MD Consulting Physician Gastroenterology  06/19/19     Chief Complaint  Patient presents with   Cough    4-5 days     Subjective: Pt presents for an OV with complaints of productive cough 4 to 5 days.  Patient is a former smoker.  She reports sputum is thick and foamy.  She denies any shortness of breath.  She had a similar presentation last year requiring 2 rounds of antibiotics and a steroid for complete recovery.  She has not been exposed to any sick contacts that she is aware of.     03/24/2021    1:49 PM 03/18/2020    8:17 AM 01/22/2020    8:19 AM 06/19/2019    9:33 AM 09/12/2017    9:59 AM  Depression screen PHQ 2/9  Decreased Interest 0 0 0 0 0  Down, Depressed, Hopeless 0 0 0 0 0  PHQ - 2 Score 0 0 0 0 0    Allergies  Allergen Reactions   Codeine Other (See Comments)    Irritable , insomnia    Grass Pollen(K-O-R-T-Swt Vern) Other (See Comments)    Trees    Pollen Extract    Social History   Social History Narrative   Married. Retired. 2 children.   Bachelors degree.   Exercises routinely.   Drinks caffeine.   Smoke alarm in the home. Wears her seatbelt. Owns firearms.   Feels safe in her relationships.   Past Medical History:  Diagnosis Date   Arthritis    Blood in stool    greater than 4 years since last incidence of blood in stool    Colon polyps    Glaucoma    Hyperlipidemia    Hypertension    Inclusion cyst 2021   face   PNA (pneumonia) 2006   Strain of left wrist 04/16/2019   Past Surgical History:  Procedure Laterality Date   ABSCESS DRAINAGE  2021   face- inclusion cyst   BREAST BIOPSY Left 07/19/2018   US guided breast bx with clip placement- benign   EYE SURGERY  1956   HEMORRHOID SURGERY  2001   RADIOACTIVE SEED GUIDED  EXCISIONAL BREAST BIOPSY Left 03/15/2019   Procedure: RADIOACTIVE SEED GUIDED EXCISIONAL LEFT  BREAST BIOPSY;  Surgeon: Johnathan Hausen, MD;  Location: [facility];  Service: General;  Laterality: Left;   TONSILLECTOMY  1963   WISDOM TOOTH EXTRACTION     Family History  Problem Relation Age of Onset   Heart attack Father    Heart attack Sister    Hyperlipidemia Sister    Hypertension Sister    Allergies as of 03/03/2022       Reactions   Codeine Other (See Comments)   Irritable , insomnia    Grass Pollen(k-o-r-t-swt Vern) Other (See Comments)   Trees   Pollen Extract         Medication List        Accurate as of March 03, 2022 12:48 PM. If you have any questions, ask your nurse or doctor.          STOP taking these medications    Linzess 145 MCG Caps capsule Generic drug: linaclotide Stopped by: Howard Pouch, DO  TAKE these medications    albuterol 108 (90 Base) MCG/ACT inhaler Commonly known as: VENTOLIN HFA Inhale 2 puffs into the lungs every 6 (six) hours as needed for wheezing or shortness of breath.   atorvastatin 20 MG tablet Commonly known as: LIPITOR Take 1 tablet (20 mg total) by mouth at bedtime.   benzonatate 200 MG capsule Commonly known as: TESSALON Take 1 capsule (200 mg total) by mouth 2 (two) times daily as needed for cough. Started by: Felix Pacini, DO   cefdinir 300 MG capsule Commonly known as: OMNICEF Take 1 capsule (300 mg total) by mouth 2 (two) times daily. Started by: Felix Pacini, DO   cholecalciferol 1000 units tablet Commonly known as: VITAMIN D Take 1,000 Units by mouth daily.   DAILY PROBIOTIC PO Take by mouth.   diclofenac 75 MG EC tablet Commonly known as: VOLTAREN Take 1 tablet (75 mg total) by mouth 2 (two) times daily.   FIBER-CAPS PO Take by mouth.   latanoprost 0.005 % ophthalmic solution Commonly known as: XALATAN Place 1 drop into both eyes at bedtime.   lisinopril 10 MG  tablet Commonly known as: ZESTRIL Take 1 tablet (10 mg total) by mouth daily.   multivitamin tablet Take 1 tablet by mouth daily.   omeprazole 20 MG capsule Commonly known as: PRILOSEC Take 1 capsule (20 mg total) by mouth daily.   predniSONE 20 MG tablet Commonly known as: DELTASONE 60 mg x2d, 40 mg x3d, 20 mg x2d, 10 mg x2d Started by: Felix Pacini, DO   timolol 0.5 % ophthalmic solution Commonly known as: TIMOPTIC        All past medical history, surgical history, allergies, family history, immunizations andmedications were updated in the EMR today and reviewed under the history and medication portions of their EMR.     Review of Systems  Constitutional:  Positive for malaise/fatigue. Negative for chills and fever.  HENT:  Positive for congestion, ear pain, sinus pain and sore throat.   Eyes:  Positive for pain. Negative for discharge and redness.  Respiratory:  Positive for cough and sputum production. Negative for shortness of breath and wheezing.   Gastrointestinal:  Negative for diarrhea, nausea and vomiting.  Musculoskeletal:  Negative for myalgias.  Skin:  Negative for rash.  Neurological:  Positive for dizziness. Negative for headaches.   Negative, with the exception of above mentioned in HPI   Objective:  BP 108/69   Pulse 70   Temp 98.7 F (37.1 C)   Wt 140 lb (63.5 kg)   SpO2 97%   BMI 24.03 kg/m  Body mass index is 24.03 kg/m. Physical Exam Vitals and nursing note reviewed.  Constitutional:      General: She is not in acute distress.    Appearance: Normal appearance. She is normal weight. She is not ill-appearing or toxic-appearing.  HENT:     Head: Normocephalic and atraumatic.     Right Ear: Tympanic membrane, ear canal and external ear normal. There is no impacted cerumen.     Left Ear: Tympanic membrane, ear canal and external ear normal. There is no impacted cerumen.     Nose: Congestion and rhinorrhea present.     Mouth/Throat:      Mouth: Mucous membranes are moist.     Pharynx: No oropharyngeal exudate or posterior oropharyngeal erythema.  Eyes:     General: No scleral icterus.       Right eye: No discharge.        Left eye:  No discharge.     Extraocular Movements: Extraocular movements intact.     Conjunctiva/sclera: Conjunctivae normal.     Pupils: Pupils are equal, round, and reactive to light.  Cardiovascular:     Rate and Rhythm: Normal rate and regular rhythm.  Pulmonary:     Effort: Pulmonary effort is normal. No respiratory distress.     Breath sounds: Normal breath sounds. No wheezing, rhonchi or rales.     Comments: Cough present Mild decrease in air movement bilaterally Musculoskeletal:     Cervical back: Neck supple.  Lymphadenopathy:     Cervical: No cervical adenopathy.  Skin:    Findings: No rash.  Neurological:     Mental Status: She is alert and oriented to person, place, and time. Mental status is at baseline.     Motor: No weakness.     Coordination: Coordination normal.     Gait: Gait normal.  Psychiatric:        Mood and Affect: Mood normal.        Behavior: Behavior normal.        Thought Content: Thought content normal.        Judgment: Judgment normal.      No results found. No results found. Results for orders placed or performed in visit on 03/03/22 (from the past 24 hour(s))  POCT rapid strep A     Status: None   Collection Time: 03/03/22 11:09 AM  Result Value Ref Range   Rapid Strep A Screen Negative Negative  POC COVID-19 BinaxNow     Status: None   Collection Time: 03/03/22 11:20 AM  Result Value Ref Range   SARS Coronavirus 2 Ag Negative Negative    Assessment/Plan: HASNA STEFANIK is a 72 y.o. female present for OV for  Bacterial respiratory infection/cough COVID and strep negative today. Patient has a history of significant bronchitis in the past.  Elected to go ahead and start treatment due to her mild airway disease. Rest, hydrate.  mucinex (DM if  cough), nettie pot or nasal saline.  Omnicef twice daily prescribed, take until completed.  Albuterol as needed Tessalon Perles twice daily as needed Prednisone x 9-day taper to start tomorrow.  IM Depo-Medrol injection provided today If cough present it can last up to 6-8 weeks.  F/U 2 weeks of not improved.   Reviewed expectations re: course of current medical issues. Discussed self-management of symptoms. Outlined signs and symptoms indicating need for more acute intervention. Patient verbalized understanding and all questions were answered. Patient received an After-Visit Summary.    Orders Placed This Encounter  Procedures   POC COVID-19 BinaxNow   POCT rapid strep A   Meds ordered this encounter  Medications   benzonatate (TESSALON) 200 MG capsule    Sig: Take 1 capsule (200 mg total) by mouth 2 (two) times daily as needed for cough.    Dispense:  20 capsule    Refill:  0   cefdinir (OMNICEF) 300 MG capsule    Sig: Take 1 capsule (300 mg total) by mouth 2 (two) times daily.    Dispense:  20 capsule    Refill:  0   predniSONE (DELTASONE) 20 MG tablet    Sig: 60 mg x2d, 40 mg x3d, 20 mg x2d, 10 mg x2d    Dispense:  15 tablet    Refill:  0   methylPREDNISolone acetate (DEPO-MEDROL) injection 80 mg   albuterol (VENTOLIN HFA) 108 (90 Base) MCG/ACT inhaler    Sig: Inhale  2 puffs into the lungs every 6 (six) hours as needed for wheezing or shortness of breath.    Dispense:  8 g    Refill:  0    Formulary albuterol inhaler please.   Referral Orders  No referral(s) requested today     Note is dictated utilizing voice recognition software. Although note has been proof read prior to signing, occasional typographical errors still can be missed. If any questions arise, please do not hesitate to call for verification.   electronically signed by:  Felix Pacini, DO  [facility] [place]

## 2022-03-03 NOTE — Patient Instructions (Addendum)
Return in about 2 weeks (around 03/17/2022), or if symptoms worsen or fail to improve.        Great to see you today.  I have refilled the medication(s) we provide.   If labs were collected, we will inform you of lab results once received either by echart message or telephone call.   - echart message- for normal results that have been seen by the patient already.   - telephone call: abnormal results or if patient has not viewed results in their echart.

## 2022-03-17 ENCOUNTER — Telehealth: Payer: Self-pay

## 2022-03-17 NOTE — Telephone Encounter (Signed)
LVM for pt to call back in regards to scheduling AWV with our health coach.   03/17/2022@currenttime@  

## 2022-03-28 ENCOUNTER — Telehealth: Payer: Self-pay | Admitting: Family Medicine

## 2022-03-28 NOTE — Telephone Encounter (Signed)
Copied from [place] [id number]. Topic: Medicare AWV >> Mar 28, 2022 10:32 AM Gillis Santa wrote: Reason for [id number] patient to schedule Medicare Annual Wellness Visit (AWV). Left message for patient to call back and schedule Medicare Annual Wellness Visit (AWV).  Last date of AWV: 01/21/2022  Please schedule an appointment at any time with [facility].  If any questions, please contact me at [phone].  Thank you ,  Lillia Dallas  [facility] AWV Team Direct Dial  [phone]

## 2022-03-28 NOTE — Telephone Encounter (Signed)
LVM PATIENT TO CALL BACK TO SCHEDULE AWV WITH HEALTH COACH [place] [place]

## 2022-03-31 DIAGNOSIS — M545 Low back pain, unspecified: Secondary | ICD-10-CM | POA: Diagnosis not present

## 2022-04-06 ENCOUNTER — Ambulatory Visit: Payer: Medicare HMO

## 2022-04-13 ENCOUNTER — Ambulatory Visit (INDEPENDENT_AMBULATORY_CARE_PROVIDER_SITE_OTHER): Payer: Medicare HMO

## 2022-04-13 VITALS — Wt 140.0 lb

## 2022-04-13 DIAGNOSIS — Z0000 Encounter for general adult medical examination without abnormal findings: Secondary | ICD-10-CM

## 2022-04-13 NOTE — Patient Instructions (Signed)
Angela [name] , Thank you for taking time to come for your Medicare Wellness Visit. I appreciate your ongoing commitment to your health goals. Please review the following plan we discussed and let me know if I can assist you in the future.   These are the goals we discussed:  Goals      Patient Stated     Eat healthier, drink more water & increase activity     Patient Stated     Stay active and healthy     Patient Stated     Stay healthy         This is a list of the screening recommended for you and due dates:  Health Maintenance  Topic Date Due   DEXA scan (bone density measurement)  05/12/2022   Medicare Annual Wellness Visit  04/13/2023   Mammogram  06/03/2023   Colon Cancer Screening  07/08/2024   DTaP/Tdap/Td vaccine (3 - Td or Tdap) 04/11/2027   Pneumonia Vaccine  Completed   Flu Shot  Completed   Hepatitis C Screening: USPSTF Recommendation to screen - Ages 18-79 yo.  Completed   Zoster (Shingles) Vaccine  Completed   HPV Vaccine  Aged Out   COVID-19 Vaccine  Discontinued    Advanced directives: Please bring a copy of your health care power of attorney and living will to the office at your convenience.  Conditions/risks identified: stay healthy  Next appointment: Follow up in one year for your annual wellness visit    Preventive Care 65 Years and Older, Female Preventive care refers to lifestyle choices and visits with your health care provider that can promote health and wellness. What does preventive care include? A yearly physical exam. This is also called an annual well check. Dental exams once or twice a year. Routine eye exams. Ask your health care provider how often you should have your eyes checked. Personal lifestyle choices, including: Daily care of your teeth and gums. Regular physical activity. Eating a healthy diet. Avoiding tobacco and drug use. Limiting alcohol use. Practicing safe sex. Taking low-dose aspirin every day. Taking vitamin and  mineral supplements as recommended by your health care provider. What happens during an annual well check? The services and screenings done by your health care provider during your annual well check will depend on your age, overall health, lifestyle risk factors, and family history of disease. Counseling  Your health care provider may ask you questions about your: Alcohol use. Tobacco use. Drug use. Emotional well-being. Home and relationship well-being. Sexual activity. Eating habits. History of falls. Memory and ability to understand (cognition). Work and work Statistician. Reproductive health. Screening  You may have the following tests or measurements: Height, weight, and BMI. Blood pressure. Lipid and cholesterol levels. These may be checked every 5 years, or more frequently if you are over 3 years old. Skin check. Lung cancer screening. You may have this screening every year starting at age 43 if you have a 30-pack-year history of smoking and currently smoke or have quit within the past 15 years. Fecal occult blood test (FOBT) of the stool. You may have this test every year starting at age 41. Flexible sigmoidoscopy or colonoscopy. You may have a sigmoidoscopy every 5 years or a colonoscopy every 10 years starting at age 66. Hepatitis C blood test. Hepatitis B blood test. Sexually transmitted disease (STD) testing. Diabetes screening. This is done by checking your blood sugar (glucose) after you have not eaten for a while (fasting). You may have  this done every 1-3 years. Bone density scan. This is done to screen for osteoporosis. You may have this done starting at age 20. Mammogram. This may be done every 1-2 years. Talk to your health care provider about how often you should have regular mammograms. Talk with your health care provider about your test results, treatment options, and if necessary, the need for more tests. Vaccines  Your health care provider may recommend  certain vaccines, such as: Influenza vaccine. This is recommended every year. Tetanus, diphtheria, and acellular pertussis (Tdap, Td) vaccine. You may need a Td booster every 10 years. Zoster vaccine. You may need this after age 32. Pneumococcal 13-valent conjugate (PCV13) vaccine. One dose is recommended after age 18. Pneumococcal polysaccharide (PPSV23) vaccine. One dose is recommended after age 51. Talk to your health care provider about which screenings and vaccines you need and how often you need them. This information is not intended to replace advice given to you by your health care provider. Make sure you discuss any questions you have with your health care provider. Document Released: 02/20/2015 Document Revised: 10/14/2015 Document Reviewed: 11/25/2014 Elsevier Interactive Patient Education  2017 [place] Prevention in the Home Falls can cause injuries. They can happen to people of all ages. There are many things you can do to make your home safe and to help prevent falls. What can I do on the outside of my home? Regularly fix the edges of walkways and driveways and fix any cracks. Remove anything that might make you trip as you walk through a door, such as a raised step or threshold. Trim any bushes or trees on the path to your home. Use bright outdoor lighting. Clear any walking paths of anything that might make someone trip, such as rocks or tools. Regularly check to see if handrails are loose or broken. Make sure that both sides of any steps have handrails. Any raised decks and porches should have guardrails on the edges. Have any leaves, snow, or ice cleared regularly. Use sand or salt on walking paths during winter. Clean up any spills in your garage right away. This includes oil or grease spills. What can I do in the bathroom? Use night lights. Install grab bars by the toilet and in the tub and shower. Do not use towel bars as grab bars. Use non-skid mats or  decals in the tub or shower. If you need to sit down in the shower, use a plastic, non-slip stool. Keep the floor dry. Clean up any water that spills on the floor as soon as it happens. Remove soap buildup in the tub or shower regularly. Attach bath mats securely with double-sided non-slip rug tape. Do not have throw rugs and other things on the floor that can make you trip. What can I do in the bedroom? Use night lights. Make sure that you have a light by your bed that is easy to reach. Do not use any sheets or blankets that are too big for your bed. They should not hang down onto the floor. Have a firm chair that has side arms. You can use this for support while you get dressed. Do not have throw rugs and other things on the floor that can make you trip. What can I do in the kitchen? Clean up any spills right away. Avoid walking on wet floors. Keep items that you use a lot in easy-to-reach places. If you need to reach something above you, use a strong step stool  that has a grab bar. Keep electrical cords out of the way. Do not use floor polish or wax that makes floors slippery. If you must use wax, use non-skid floor wax. Do not have throw rugs and other things on the floor that can make you trip. What can I do with my stairs? Do not leave any items on the stairs. Make sure that there are handrails on both sides of the stairs and use them. Fix handrails that are broken or loose. Make sure that handrails are as long as the stairways. Check any carpeting to make sure that it is firmly attached to the stairs. Fix any carpet that is loose or worn. Avoid having throw rugs at the top or bottom of the stairs. If you do have throw rugs, attach them to the floor with carpet tape. Make sure that you have a light switch at the top of the stairs and the bottom of the stairs. If you do not have them, ask someone to add them for you. What else can I do to help prevent falls? Wear shoes that: Do not  have high heels. Have rubber bottoms. Are comfortable and fit you well. Are closed at the toe. Do not wear sandals. If you use a stepladder: Make sure that it is fully opened. Do not climb a closed stepladder. Make sure that both sides of the stepladder are locked into place. Ask someone to hold it for you, if possible. Clearly mark and make sure that you can see: Any grab bars or handrails. First and last steps. Where the edge of each step is. Use tools that help you move around (mobility aids) if they are needed. These include: Canes. Walkers. Scooters. Crutches. Turn on the lights when you go into a dark area. Replace any light bulbs as soon as they burn out. Set up your furniture so you have a clear path. Avoid moving your furniture around. If any of your floors are uneven, fix them. If there are any pets around you, be aware of where they are. Review your medicines with your doctor. Some medicines can make you feel dizzy. This can increase your chance of falling. Ask your doctor what other things that you can do to help prevent falls. This information is not intended to replace advice given to you by your health care provider. Make sure you discuss any questions you have with your health care provider. Document Released: 11/20/2008 Document Revised: 07/02/2015 Document Reviewed: 02/28/2014 Elsevier Interactive Patient Education  2017 [employer].

## 2022-04-13 NOTE — Progress Notes (Signed)
Subjective:   Angela [name] is a 72 y.o. female who presents for Medicare Annual (Subsequent) preventive examination.  Review of Systems     Cardiac Risk Factors include: advanced age (>38mn, >>34women);dyslipidemia;hypertension     Objective:    Today's Vitals   04/13/22 0834  Weight: 140 lb (63.5 kg)   Body mass index is 24.03 kg/m.     04/13/2022    8:39 AM 03/24/2021    1:50 PM 03/18/2020    8:15 AM 03/15/2019    6:28 AM  Advanced Directives  Does Patient Have a Medical Advance Directive? Yes Yes Yes Yes  Type of AParamedicof AAdaLiving will Healthcare Power of AAureliaLiving will HIndiahomaLiving will  Copy of HFriscoin Chart? No - copy requested  Yes - validated most recent copy scanned in chart (See row information) No - copy requested    Current Medications (verified) Outpatient Encounter Medications as of 04/13/2022  Medication Sig   ABRYSVO 120 MCG/0.5ML injection    albuterol (VENTOLIN HFA) 108 (90 Base) MCG/ACT inhaler Inhale 2 puffs into the lungs every 6 (six) hours as needed for wheezing or shortness of breath.   atorvastatin (LIPITOR) 20 MG tablet Take 1 tablet (20 mg total) by mouth at bedtime.   Calcium Polycarbophil (FIBER-CAPS PO) Take by mouth.   diclofenac (VOLTAREN) 75 MG EC tablet Take 1 tablet (75 mg total) by mouth 2 (two) times daily.   latanoprost (XALATAN) 0.005 % ophthalmic solution Place 1 drop into both eyes at bedtime.   lisinopril (ZESTRIL) 10 MG tablet Take 1 tablet (10 mg total) by mouth daily.   Multiple Vitamin (MULTIVITAMIN) tablet Take 1 tablet by mouth daily.   Probiotic Product (DAILY PROBIOTIC PO) Take by mouth.   timolol (TIMOPTIC) 0.5 % ophthalmic solution    [DISCONTINUED] benzonatate (TESSALON) 200 MG capsule Take 1 capsule (200 mg total) by mouth 2 (two) times daily as needed for cough.   [DISCONTINUED] cefdinir (OMNICEF) 300 MG  capsule Take 1 capsule (300 mg total) by mouth 2 (two) times daily.   [DISCONTINUED] cholecalciferol (VITAMIN D) 1000 units tablet Take 1,000 Units by mouth daily.   [DISCONTINUED] omeprazole (PRILOSEC) 20 MG capsule Take 1 capsule (20 mg total) by mouth daily.   [DISCONTINUED] predniSONE (DELTASONE) 20 MG tablet 60 mg x2d, 40 mg x3d, 20 mg x2d, 10 mg x2d   No facility-administered encounter medications on file as of 04/13/2022.    Allergies (verified) Codeine, Grass pollen([id number] vern), and Pollen extract   History: Past Medical History:  Diagnosis Date   Arthritis    Blood in stool    greater than 4 years since last incidence of blood in stool    Colon polyps    Glaucoma    Hyperlipidemia    Hypertension    Inclusion cyst 2021   face   PNA (pneumonia) 2006   Strain of left wrist 04/16/2019   Past Surgical History:  Procedure Laterality Date   ABSCESS DRAINAGE  2021   face- inclusion cyst   BREAST BIOPSY Left 07/19/2018   [place] breast bx with clip placement- benign   EYE SURGERY  1956   HEMORRHOID SURGERY  2001   RADIOACTIVE SEED GUIDED EXCISIONAL BREAST BIOPSY Left 03/15/2019   Procedure: RADIOACTIVE SEED GUIDED EXCISIONAL LEFT  BREAST BIOPSY;  Surgeon: MJohnathan Hausen MD;  [facility] [facility]  Service: General;  Laterality: Left;   TONSILLECTOMY  1963   WISDOM TOOTH EXTRACTION     Family History  Problem Relation Age of Onset   Heart attack Father    Heart attack Sister    Hyperlipidemia Sister    Hypertension Sister    Social History   Socioeconomic History   Marital status: Married    Spouse name: Not on file   Number of children: Not on file   Years of education: Not on file   Highest education level: Not on file  Occupational History   Not on file  Tobacco Use   Smoking status: Former    Packs/day: 0.00    Years: 15.00    Total pack years: 0.00    Types: Cigars, Cigarettes   Smokeless tobacco: Never   Tobacco comments:     *occasional smoker   Vaping Use   Vaping Use: Former  Substance and Sexual Activity   Alcohol use: Yes    Alcohol/week: 4.0 standard drinks of alcohol    Types: 4 Glasses of wine per week    Comment: *rarely    Drug use: No   Sexual activity: Yes    Partners: Male    Comment: Married  Other Topics Concern   Not on file  Social History Narrative   Married. Retired. 2 children.   Bachelors degree.   Exercises routinely.   Drinks caffeine.   Smoke alarm in the home. Wears her seatbelt. Owns firearms.   Feels safe in her relationships.   Social Determinants of Health   Financial Resource Strain: Low Risk  (04/13/2022)   Overall Financial Resource Strain (CARDIA)    Difficulty of Paying Living Expenses: Not hard at all  Food Insecurity: No Food Insecurity (04/13/2022)   Hunger Vital Sign    Worried About Running Out of Food in the Last Year: Never true    Ran Out of Food in the Last Year: Never true  Transportation Needs: No Transportation Needs (04/13/2022)   PRAPARE - Hydrologist (Medical): No    Lack of Transportation (Non-Medical): No  Physical Activity: Sufficiently Active (04/13/2022)   Exercise Vital Sign    Days of Exercise per Week: 7 days    Minutes of Exercise per Session: 60 min  Stress: No Stress Concern Present (04/13/2022)   [place]    Feeling of Stress : Not at all  Social Connections: Moderately Isolated (04/13/2022)   Social Connection and Isolation Panel [NHANES]    Frequency of Communication with Friends and Family: More than three times a week    Frequency of Social Gatherings with Friends and Family: More than three times a week    Attends Religious Services: Never    Marine scientist or Organizations: No    Attends Music therapist: Never    Marital Status: Married    Tobacco Counseling Counseling given: Not Answered Tobacco comments:  *occasional smoker    Clinical Intake:  Pre-visit preparation completed: Yes  Pain : No/denies pain     BMI - recorded: 24.03 Nutritional Status: BMI of 19-24  Normal Nutritional Risks: None Diabetes: No  How often do you need to have someone help you when you read instructions, pamphlets, or other written materials from your doctor or pharmacy?: 1 - Never  Diabetic?no  Interpreter Needed?: No  Information entered by :: Charlott Rakes, LPN   Activities of Daily Living    04/13/2022    8:48 AM  In your present state of health, do you have any difficulty performing the following activities:  Hearing? 0  Vision? 0  Difficulty concentrating or making decisions? 0  Walking or climbing stairs? 0  Dressing or bathing? 0  Doing errands, shopping? 0  Preparing Food and eating ? N  Using the Toilet? N  In the past six months, have you accidently leaked urine? N  Do you have problems with loss of bowel control? N  Managing your Medications? N  Managing your Finances? N  Housekeeping or managing your Housekeeping? N    Patient Care Team: Ma Hillock, DO as PCP - General (Family Medicine) Arta Silence, MD as Consulting Physician (Gastroenterology)  Indicate any recent Medical Services you may have received from other than [facility] providers in the past year (date may be approximate).     Assessment:   This is a routine wellness examination for Neya.  Hearing/Vision screen Hearing Screening - Comments:: Pt denies any hearing issues  Vision Screening - Comments:: Pt follows up with [facility] for annual eye exams   Dietary issues and exercise activities discussed: Current Exercise Habits: The patient does not participate in regular exercise at present   Goals Addressed             This Visit's Progress    Patient Stated       Stay healthy        Depression Screen    04/13/2022    8:37 AM 03/24/2021    1:49 PM 03/18/2020    8:17 AM 01/22/2020    8:19 AM  06/19/2019    9:33 AM 09/12/2017    9:59 AM 04/10/2017    2:03 PM  PHQ 2/9 Scores  PHQ - 2 Score 0 0 0 0 0 0 0    Fall Risk    04/13/2022    8:48 AM 08/05/2021    9:45 AM 03/24/2021    1:51 PM 03/18/2020    8:17 AM 01/22/2020    8:19 AM  Fall Risk   Falls in the past year? 0 0 0 0 0  Number falls in past yr: 0  0 0 0  Injury with Fall? 0  0 0 0  Risk for fall due to : Impaired vision  Impaired vision    Follow up Falls prevention discussed  Falls prevention discussed Falls prevention discussed Falls evaluation completed    FALL RISK PREVENTION PERTAINING TO THE HOME:  Any stairs in or around the home? Yes  If so, are there any without handrails? No  Home free of loose throw rugs in walkways, pet beds, electrical cords, etc? Yes  Adequate lighting in your home to reduce risk of falls? Yes   ASSISTIVE DEVICES UTILIZED TO PREVENT FALLS:  Life alert? No  Use of a cane, walker or w/c? No  Grab bars in the bathroom? No  Shower chair or bench in shower? Yes  Elevated toilet seat or a handicapped toilet? No   TIMED UP AND GO:  Was the test performed? No .   Cognitive Function:declined         Immunizations Immunization History  Administered Date(s) Administered   Fluad Quad(high Dose 65+) 10/23/2020, 10/26/2021   Influenza, High Dose Seasonal PF 10/14/2016, 11/30/2017, 11/02/2018   Influenza-Unspecified 01/08/2001, 11/13/2001, 11/20/2012, 11/08/2019   PFIZER(Purple Top)SARS-COV-2 Vaccination 03/17/2019, 04/11/2019, 01/19/2020   PNEUMOCOCCAL CONJUGATE-20 10/23/2020   Pneumococcal Conjugate-13 06/19/2019   Td 02/07/1998   Tdap 04/10/2017   Zoster Recombinat (Shingrix) 04/10/2017,  09/12/2017    TDAP status: Up to date  Flu Vaccine status: Up to date  Pneumococcal vaccine status: Up to date  Covid-19 vaccine status: Completed vaccines  Qualifies for Shingles Vaccine? Yes   Zostavax completed Yes   Shingrix Completed?: Yes  Screening Tests Health Maintenance  Topic  Date Due   DEXA SCAN  05/12/2022   Medicare Annual Wellness (AWV)  04/13/2023   MAMMOGRAM  06/03/2023   COLONOSCOPY (Pts 45-24yr Insurance coverage will need to be confirmed)  07/08/2024   DTaP/Tdap/Td (3 - Td or Tdap) 04/11/2027   Pneumonia Vaccine 70 Years old  Completed   INFLUENZA VACCINE  Completed   Hepatitis C Screening  Completed   Zoster Vaccines- Shingrix  Completed   HPV VACCINES  Aged Out   COVID-19 Vaccine  Discontinued    Health Maintenance  There are no preventive care reminders to display for this patient.   Colorectal cancer screening: Type of screening: Colonoscopy. Completed 07/09/19. Repeat every 5 years  Mammogram status: Completed 06/02/21. Repeat every year  Bone Density status: Completed 05/11/17. Results reflect: Bone density results: NORMAL. Repeat every 2 years.   Additional Screening:  Hepatitis C Screening: Completed 04/21/17  Vision Screening: Recommended annual ophthalmology exams for early detection of glaucoma and other disorders of the eye. Is the patient up to date with their annual eye exam?  Yes  Who is the provider or what is the name of the office in which the patient attends annual eye exams? [facility]  If pt is not established with a provider, would they like to be referred to a provider to establish care? No .   Dental Screening: Recommended annual dental exams for proper oral hygiene  Community Resource Referral / Chronic Care Management: CRR required this visit?  No   CCM required this visit?  No      Plan:     I have personally reviewed and noted the following in the patient's chart:   Medical and social history Use of alcohol, tobacco or illicit drugs  Current medications and supplements including opioid prescriptions. Patient is not currently taking opioid prescriptions. Functional ability and status Nutritional status Physical activity Advanced directives List of other physicians Hospitalizations, surgeries, and ER  visits in previous 12 months Vitals Screenings to include cognitive, depression, and falls Referrals and appointments  In addition, I have reviewed and discussed with patient certain preventive protocols, quality metrics, and best practice recommendations. A written personalized care plan for preventive services as well as general preventive health recommendations were provided to patient.     TWillette Brace LPN   [id number]  Nurse Notes: Declined at this time

## 2022-04-26 DIAGNOSIS — M5459 Other low back pain: Secondary | ICD-10-CM | POA: Diagnosis not present

## 2022-05-05 DIAGNOSIS — M5416 Radiculopathy, lumbar region: Secondary | ICD-10-CM | POA: Diagnosis not present

## 2022-05-10 DIAGNOSIS — M5459 Other low back pain: Secondary | ICD-10-CM | POA: Diagnosis not present

## 2022-05-18 DIAGNOSIS — M5416 Radiculopathy, lumbar region: Secondary | ICD-10-CM | POA: Diagnosis not present

## 2022-05-25 ENCOUNTER — Other Ambulatory Visit: Payer: Self-pay | Admitting: Family Medicine

## 2022-05-25 DIAGNOSIS — M5416 Radiculopathy, lumbar region: Secondary | ICD-10-CM | POA: Diagnosis not present

## 2022-06-08 DIAGNOSIS — M5416 Radiculopathy, lumbar region: Secondary | ICD-10-CM | POA: Diagnosis not present

## 2022-06-17 DIAGNOSIS — M791 Myalgia, unspecified site: Secondary | ICD-10-CM | POA: Diagnosis not present

## 2022-06-17 DIAGNOSIS — M25511 Pain in right shoulder: Secondary | ICD-10-CM | POA: Diagnosis not present

## 2022-06-21 ENCOUNTER — Encounter: Payer: Self-pay | Admitting: Dermatology

## 2022-06-21 ENCOUNTER — Ambulatory Visit: Payer: Medicare HMO | Admitting: Dermatology

## 2022-06-21 VITALS — BP 115/72

## 2022-06-21 DIAGNOSIS — D1801 Hemangioma of skin and subcutaneous tissue: Secondary | ICD-10-CM

## 2022-06-21 DIAGNOSIS — L821 Other seborrheic keratosis: Secondary | ICD-10-CM

## 2022-06-21 DIAGNOSIS — D229 Melanocytic nevi, unspecified: Secondary | ICD-10-CM

## 2022-06-21 DIAGNOSIS — X32XXXA Exposure to sunlight, initial encounter: Secondary | ICD-10-CM | POA: Diagnosis not present

## 2022-06-21 DIAGNOSIS — L578 Other skin changes due to chronic exposure to nonionizing radiation: Secondary | ICD-10-CM

## 2022-06-21 DIAGNOSIS — W908XXA Exposure to other nonionizing radiation, initial encounter: Secondary | ICD-10-CM

## 2022-06-21 DIAGNOSIS — L814 Other melanin hyperpigmentation: Secondary | ICD-10-CM | POA: Diagnosis not present

## 2022-06-21 DIAGNOSIS — Z1283 Encounter for screening for malignant neoplasm of skin: Secondary | ICD-10-CM

## 2022-06-21 NOTE — Patient Instructions (Signed)
    Due to recent changes in healthcare laws, you may see results of your pathology and/or laboratory studies on MyChart before the doctors have had a chance to review them. We understand that in some cases there may be results that are confusing or concerning to you. Please understand that not all results are received at the same time and often the doctors may need to interpret multiple results in order to provide you with the best plan of care or course of treatment. Therefore, we ask that you please give us 2 business days to thoroughly review all your results before contacting the office for clarification. Should we see a critical lab result, you will be contacted sooner.   If You Need Anything After Your Visit  If you have any questions or concerns for your doctor, please call our main line at [phone] If no one answers, please leave a voicemail as directed and we will return your call as soon as possible. Messages left after 4 pm will be answered the following business day.   You may also send us a message via MyChart. We typically respond to MyChart messages within 1-2 business days.  For prescription refills, please ask your pharmacy to contact our office. Our fax number is [phone].  If you have an urgent issue when the clinic is closed that cannot wait until the next business day, you can page your doctor at the number below.    Please note that while we do our best to be available for urgent issues outside of office hours, we are not available 24/7.   If you have an urgent issue and are unable to reach us, you may choose to seek medical care at your doctor's office, retail clinic, urgent care center, or emergency room.  If you have a medical emergency, please immediately call 911 or go to the emergency department. In the event of inclement weather, please call our main line at [phone] for an update on the status of any delays or closures.  Dermatology Medication  Tips: Please keep the boxes that topical medications come in in order to help keep track of the instructions about where and how to use these. Pharmacies typically print the medication instructions only on the boxes and not directly on the medication tubes.   If your medication is too expensive, please contact our office at [phone] or send us a message through MyChart.   We are unable to tell what your co-pay for medications will be in advance as this is different depending on your insurance coverage. However, we may be able to find a substitute medication at lower cost or fill out paperwork to get insurance to cover a needed medication.   If a prior authorization is required to get your medication covered by your insurance company, please allow us 1-2 business days to complete this process.  Drug prices often vary depending on where the prescription is filled and some pharmacies may offer cheaper prices.  The website www.goodrx.com contains coupons for medications through different pharmacies. The prices here do not account for what the cost may be with help from insurance (it may be cheaper with your insurance), but the website can give you the price if you did not use any insurance.  - You can print the associated coupon and take it with your prescription to the pharmacy.  - You may also stop by our office during regular business hours and pick up a GoodRx coupon card.  -   If you need your prescription sent electronically to a different pharmacy, notify our office through [facility] MyChart or by phone at [phone]    Skin Education :   I counseled the patient regarding the following: Sun screen (SPF 30 or greater) should be applied during peak UV exposure (between 10am and 2pm) and reapplied after exercise or swimming.  The ABCDEs of melanoma were reviewed with the patient, and the importance of monthly self-examination of moles was emphasized. Should any moles change in shape or  color, or itch, bleed or burn, pt will contact our office for evaluation sooner then their interval appointment.  Plan: Sunscreen Recommendations I recommended a broad spectrum sunscreen with a SPF of 30 or higher. I explained that SPF 30 sunscreens block approximately 97 percent of the sun's harmful rays. Sunscreens should be applied at least 15 minutes prior to expected sun exposure and then every 2 hours after that as long as sun exposure continues. If swimming or exercising sunscreen should be reapplied every 45 minutes to an hour after getting wet or sweating. One ounce, or the equivalent of a shot glass full of sunscreen, is adequate to protect the skin not covered by a bathing suit. I also recommended a lip balm with a sunscreen as well. Sun protective clothing can be used in lieu of sunscreen but must be worn the entire time you are exposed to the sun's rays.  

## 2022-06-21 NOTE — Progress Notes (Signed)
   New Patient Visit   Subjective  Angela [name] is a 72 y.o. female who presents for the following: Skin Cancer Screening and Full Body Skin Exam  The patient presents for Total-Body Skin Exam (TBSE) for skin cancer screening and mole check. The patient has spots, moles and lesions to be evaluated, some may be new or changing and the patient has concerns that these could be cancer.   She has never had a full skin exam. She has no personal or known family history of skin cancer   The following portions of the chart were reviewed this encounter and updated as appropriate: medications, allergies, medical history  Review of Systems:  No other skin or systemic complaints except as noted in HPI or Assessment and Plan.  Objective  Well appearing patient in no apparent distress; mood and affect are within normal limits.  A full examination was performed including scalp, head, eyes, ears, nose, lips, neck, chest, axillae, abdomen, back, buttocks, bilateral upper extremities, bilateral lower extremities, hands, feet, fingers, toes, fingernails, and toenails. All findings within normal limits unless otherwise noted below.   Relevant physical exam findings are noted in the Assessment and Plan.    Assessment & Plan   LENTIGINES, SEBORRHEIC KERATOSES, HEMANGIOMAS - Benign normal skin lesions - Benign-appearing - Call for any changes  MELANOCYTIC NEVI - Tan-brown and/or pink-flesh-colored symmetric macules and papules - Benign appearing on exam today - Observation - Call clinic for new or changing moles - Recommend daily use of broad spectrum spf 30+ sunscreen to sun-exposed areas.   ACTINIC DAMAGE - Chronic condition, secondary to cumulative UV/sun exposure - diffuse scaly erythematous macules with underlying dyspigmentation - Recommend daily broad spectrum sunscreen SPF 30+ to sun-exposed areas, reapply every 2 hours as needed.  - Staying in the shade or wearing long sleeves, sun  glasses (UVA+UVB protection) and wide brim hats (4-inch brim around the entire circumference of the hat) are also recommended for sun protection.  - Call for new or changing lesions.    SKIN CANCER SCREENING PERFORMED TODAY.      Return in about 1 year (around 06/21/2023) for TBSE.  Jaclynn Guarneri, CMA, am acting as scribe for [employer], DO.   Documentation: I have reviewed the above documentation for accuracy and completeness, and I agree with the above.  Langston Reusing, DO

## 2022-07-20 ENCOUNTER — Other Ambulatory Visit: Payer: Self-pay | Admitting: Family Medicine

## 2022-07-20 DIAGNOSIS — Z1231 Encounter for screening mammogram for malignant neoplasm of breast: Secondary | ICD-10-CM

## 2022-08-10 ENCOUNTER — Ambulatory Visit
Admission: RE | Admit: 2022-08-10 | Discharge: 2022-08-10 | Disposition: A | Discharge status: Home / Self Care | Payer: Medicare HMO | Source: Ambulatory Visit | Attending: Family Medicine | Admitting: Family Medicine

## 2022-08-10 DIAGNOSIS — Z1231 Encounter for screening mammogram for malignant neoplasm of breast: Secondary | ICD-10-CM

## 2022-08-31 DIAGNOSIS — H401132 Primary open-angle glaucoma, bilateral, moderate stage: Secondary | ICD-10-CM | POA: Diagnosis not present

## 2022-08-31 DIAGNOSIS — Z0100 Encounter for examination of eyes and vision without abnormal findings: Secondary | ICD-10-CM | POA: Diagnosis not present

## 2022-08-31 DIAGNOSIS — H25813 Combined forms of age-related cataract, bilateral: Secondary | ICD-10-CM | POA: Diagnosis not present

## 2022-09-23 ENCOUNTER — Encounter: Payer: Self-pay | Admitting: Family Medicine

## 2022-09-23 ENCOUNTER — Ambulatory Visit (INDEPENDENT_AMBULATORY_CARE_PROVIDER_SITE_OTHER): Payer: Medicare HMO | Admitting: Family Medicine

## 2022-09-23 VITALS — BP 99/64 | HR 64 | Temp 98.3°F | Wt 145.4 lb

## 2022-09-23 DIAGNOSIS — J329 Chronic sinusitis, unspecified: Secondary | ICD-10-CM

## 2022-09-23 DIAGNOSIS — J029 Acute pharyngitis, unspecified: Secondary | ICD-10-CM | POA: Diagnosis not present

## 2022-09-23 DIAGNOSIS — B9689 Other specified bacterial agents as the cause of diseases classified elsewhere: Secondary | ICD-10-CM | POA: Diagnosis not present

## 2022-09-23 DIAGNOSIS — R0989 Other specified symptoms and signs involving the circulatory and respiratory systems: Secondary | ICD-10-CM | POA: Diagnosis not present

## 2022-09-23 LAB — POCT RAPID STREP A (OFFICE): Rapid Strep A Screen: NEGATIVE

## 2022-09-23 LAB — POC COVID19 BINAXNOW: SARS Coronavirus 2 Ag: NEGATIVE

## 2022-09-23 MED ORDER — CEFDINIR 300 MG PO CAPS: 300.0000 mg | ORAL_CAPSULE | Freq: Two times a day (BID) | ORAL | 0 refills | Status: DC

## 2022-09-23 MED ORDER — PREDNISONE 20 MG PO TABS: ORAL_TABLET | ORAL | 0 refills | Status: DC

## 2022-09-23 MED ORDER — METHYLPREDNISOLONE ACETATE 80 MG/ML IJ SUSP
80.0000 mg | Freq: Once | INTRAMUSCULAR | Status: AC
  Administered 2022-09-23: 80 mg via INTRAMUSCULAR

## 2022-09-23 NOTE — Patient Instructions (Addendum)

## 2022-09-23 NOTE — Progress Notes (Signed)
Angela [name] , 25-Mar-1950, 72 y.o., female MRN: [id number] Patient Care Team    Relationship Specialty Notifications Start End  Natalia Leatherwood, DO PCP - General Family Medicine  04/10/17   Willis Modena, MD Consulting Physician Gastroenterology  06/19/19     Chief Complaint  Patient presents with   Nasal Congestion    Sinus pressure; sore throat; started Monday, neg COVID on wed     Subjective: Angela [name] is a 72 y.o. Pt presents for an OV with complaints of nasal/chest congestion, sore throat and sins pain of 5 days duration.  Associated symptoms include fatgue and PND. Pt has tried mucinex DM to ease their symptoms.      04/13/2022    8:37 AM 03/24/2021    1:49 PM 03/18/2020    8:17 AM 01/22/2020    8:19 AM 06/19/2019    9:33 AM  Depression screen PHQ 2/9  Decreased Interest 0 0 0 0 0  Down, Depressed, Hopeless 0 0 0 0 0  PHQ - 2 Score 0 0 0 0 0    Allergies  Allergen Reactions   Codeine Other (See Comments)    Irritable , insomnia    Grass Pollen(K-O-R-T-Swt Vern) Other (See Comments)    Trees    Pollen Extract    Social History   Social History Narrative   Married. Retired. 2 children.   Bachelors degree.   Exercises routinely.   Drinks caffeine.   Smoke alarm in the home. Wears her seatbelt. Owns firearms.   Feels safe in her relationships.   Past Medical History:  Diagnosis Date   Arthritis    Blood in stool    greater than 4 years since last incidence of blood in stool    Colon polyps    Glaucoma    Hyperlipidemia    Hypertension    Inclusion cyst 2021   face   PNA (pneumonia) 2006   Strain of left wrist 04/16/2019   Past Surgical History:  Procedure Laterality Date   ABSCESS DRAINAGE  2021   face- inclusion cyst   BREAST BIOPSY Left 07/19/2018   US guided breast bx with clip placement- benign   EYE SURGERY  1956   HEMORRHOID SURGERY  2001   RADIOACTIVE SEED GUIDED EXCISIONAL BREAST BIOPSY Left 03/15/2019   Procedure: RADIOACTIVE  SEED GUIDED EXCISIONAL LEFT  BREAST BIOPSY;  Surgeon: Luretha Murphy, MD;  Location: [facility];  Service: General;  Laterality: Left;   TONSILLECTOMY  1963   WISDOM TOOTH EXTRACTION     Family History  Problem Relation Age of Onset   Heart attack Father    Heart attack Sister    Hyperlipidemia Sister    Hypertension Sister    Allergies as of 09/23/2022       Reactions   Codeine Other (See Comments)   Irritable , insomnia    Grass Pollen(k-o-r-t-swt Vern) Other (See Comments)   Trees   Pollen Extract         Medication List        Accurate as of September 23, 2022  9:09 AM. If you have any questions, ask your nurse or doctor.          Abrysvo 120 MCG/0.5ML injection Generic drug: RSV bivalent vaccine   albuterol 108 (90 Base) MCG/ACT inhaler Commonly known as: VENTOLIN HFA Inhale 2 puffs into the lungs every 6 (six) hours as needed for wheezing or shortness of breath.  atorvastatin 20 MG tablet Commonly known as: LIPITOR Take 1 tablet (20 mg total) by mouth at bedtime.   cefdinir 300 MG capsule Commonly known as: OMNICEF Take 1 capsule (300 mg total) by mouth 2 (two) times daily. Started by: Felix Pacini   DAILY PROBIOTIC PO Take by mouth.   diclofenac 75 MG EC tablet Commonly known as: VOLTAREN Take 1 tablet (75 mg total) by mouth 2 (two) times daily.   FIBER-CAPS PO Take by mouth.   latanoprost 0.005 % ophthalmic solution Commonly known as: XALATAN Place 1 drop into both eyes at bedtime.   lisinopril 10 MG tablet Commonly known as: ZESTRIL TAKE 1 TABLET BY MOUTH DAILY   multivitamin tablet Take 1 tablet by mouth daily.   predniSONE 20 MG tablet Commonly known as: DELTASONE 60 mg x2d, 40 mg x3d, 20 mg x2d, 10 mg x2d Start taking on: September 24, 2022 Started by: Felix Pacini   timolol 0.5 % ophthalmic solution Commonly known as: TIMOPTIC        All past medical history, surgical history, allergies, family history,  immunizations andmedications were updated in the EMR today and reviewed under the history and medication portions of their EMR.     Review of Systems  Constitutional:  Positive for malaise/fatigue. Negative for chills and fever.  HENT:  Positive for congestion, sinus pain and sore throat. Negative for ear pain.   Eyes:  Negative for pain.  Respiratory:  Negative for cough, sputum production, shortness of breath and wheezing.   Gastrointestinal:  Negative for abdominal pain, constipation, diarrhea, nausea and vomiting.  Musculoskeletal:  Negative for myalgias.  Skin:  Negative for rash.  Neurological:  Positive for headaches. Negative for dizziness.   Negative, with the exception of above mentioned in HPI   Objective:  BP 99/64   Pulse 64   Temp 98.3 F (36.8 C)   Wt 145 lb 6.4 oz (66 kg)   SpO2 97%   BMI 24.96 kg/m  Body mass index is 24.96 kg/m. Physical Exam Vitals and nursing note reviewed.  Constitutional:      General: She is not in acute distress.    Appearance: Normal appearance. She is normal weight. She is not ill-appearing or toxic-appearing.  HENT:     Head: Normocephalic and atraumatic.     Comments: TTP max sinus    Right Ear: Tympanic membrane, ear canal and external ear normal. There is no impacted cerumen.     Left Ear: Tympanic membrane, ear canal and external ear normal. There is no impacted cerumen.     Nose: Congestion present. No rhinorrhea.     Mouth/Throat:     Mouth: Mucous membranes are moist.     Pharynx: No oropharyngeal exudate or posterior oropharyngeal erythema.  Eyes:     General: No scleral icterus.       Right eye: No discharge.        Left eye: No discharge.     Extraocular Movements: Extraocular movements intact.     Conjunctiva/sclera: Conjunctivae normal.     Pupils: Pupils are equal, round, and reactive to light.  Cardiovascular:     Rate and Rhythm: Normal rate and regular rhythm.     Heart sounds: No murmur heard. Pulmonary:      Effort: Pulmonary effort is normal. No respiratory distress.     Breath sounds: Rhonchi present. No wheezing or rales.  Musculoskeletal:     Cervical back: Neck supple.  Lymphadenopathy:     Cervical: No  cervical adenopathy.  Skin:    Findings: No rash.  Neurological:     Mental Status: She is alert and oriented to person, place, and time. Mental status is at baseline.     Motor: No weakness.     Coordination: Coordination normal.     Gait: Gait normal.  Psychiatric:        Mood and Affect: Mood normal.        Behavior: Behavior normal.        Thought Content: Thought content normal.        Judgment: Judgment normal.    No results found. No results found. Results for orders placed or performed in visit on 09/23/22 (from the past 24 hour(s))  POC COVID-19 BinaxNow     Status: None   Collection Time: 09/23/22  9:00 AM  Result Value Ref Range   SARS Coronavirus 2 Ag Negative Negative  POCT rapid strep A     Status: None   Collection Time: 09/23/22  9:00 AM  Result Value Ref Range   Rapid Strep A Screen Negative Negative    Assessment/Plan: Angela [name] is a 72 y.o. female present for OV for  Chest congestion - POC COVID-19 BinaxNow> negative - POCT rapid strep A>negative - methylPREDNISolone acetate (DEPO-MEDROL) injection 80 mg Sore throat - POCT rapid strep A - methylPREDNISolone acetate (DEPO-MEDROL) injection 80 mg  Bacterial sinusitis Rest, hydrate.  Continue mucinex (DM if cough), nettie pot or nasal saline.  Omnicef BID prescribed, take until completed.  Prednisone taper to start tomorrow IM depo medrol 80 completed today If cough present it can last up to 6-8 weeks.  F/U 2 weeks of not improved.    Reviewed expectations re: course of current medical issues. Discussed self-management of symptoms. Outlined signs and symptoms indicating need for more acute intervention. Patient verbalized understanding and all questions were answered. Patient received an  After-Visit Summary.    Orders Placed This Encounter  Procedures   POC COVID-19 BinaxNow   POCT rapid strep A   Meds ordered this encounter  Medications   cefdinir (OMNICEF) 300 MG capsule    Sig: Take 1 capsule (300 mg total) by mouth 2 (two) times daily.    Dispense:  20 capsule    Refill:  0   predniSONE (DELTASONE) 20 MG tablet    Sig: 60 mg x2d, 40 mg x3d, 20 mg x2d, 10 mg x2d    Dispense:  15 tablet    Refill:  0   methylPREDNISolone acetate (DEPO-MEDROL) injection 80 mg   Referral Orders  No referral(s) requested today     Note is dictated utilizing voice recognition software. Although note has been proof read prior to signing, occasional typographical errors still can be missed. If any questions arise, please do not hesitate to call for verification.   electronically signed by:  Felix Pacini, DO  [facility] [place]

## 2022-10-28 ENCOUNTER — Ambulatory Visit (INDEPENDENT_AMBULATORY_CARE_PROVIDER_SITE_OTHER): Payer: Medicare HMO | Admitting: Family Medicine

## 2022-10-28 ENCOUNTER — Encounter: Payer: Self-pay | Admitting: Family Medicine

## 2022-10-28 VITALS — BP 112/64 | HR 67 | Temp 97.8°F | Wt 133.6 lb

## 2022-10-28 DIAGNOSIS — U071 COVID-19: Secondary | ICD-10-CM

## 2022-10-28 DIAGNOSIS — R6889 Other general symptoms and signs: Secondary | ICD-10-CM

## 2022-10-28 DIAGNOSIS — R52 Pain, unspecified: Secondary | ICD-10-CM

## 2022-10-28 LAB — POCT INFLUENZA A/B
Influenza A, POC: NEGATIVE
Influenza B, POC: NEGATIVE

## 2022-10-28 LAB — POC COVID19 BINAXNOW: SARS Coronavirus 2 Ag: POSITIVE — AB

## 2022-10-28 MED ORDER — NIRMATRELVIR/RITONAVIR (PAXLOVID)TABLET: 3.0000 | ORAL_TABLET | Freq: Two times a day (BID) | ORAL | 0 refills | Status: AC

## 2022-10-28 NOTE — Patient Instructions (Addendum)
Follow up as needed or as scheduled START the Paxlovid as directed HOLD your Atorvastatin (cholesterol medication) while on the Paxlovid.  You can restart when you're done with the medication Drink LOTS of fluids REST Tylenol/Ibuprofen as needed for body aches, fever, headache Robitussin or Delsym as needed for cough Coricidin HBP for congestion Call with any questions or concerns Hang in there!

## 2022-10-28 NOTE — Progress Notes (Signed)
Subjective:    Patient ID: Angela [name], female    DOB: 12/02/1950, 72 y.o.   MRN: [id number]  HPI URI- sxs started 'like a ton of bricks' yesterday AM.  No fever.  + body aches, HA, cough, congestion.  No N/V/D.  No known sick contacts.   Review of Systems For ROS see HPI     Objective:   Physical Exam Vitals reviewed.  Constitutional:      General: She is not in acute distress.    Appearance: Normal appearance. She is not ill-appearing.  HENT:     Head: Normocephalic and atraumatic.     Right Ear: Tympanic membrane and ear canal normal.     Left Ear: Tympanic membrane and ear canal normal.     Nose: Congestion present.     Comments: + TTP over frontal and maxillary sinuses Cardiovascular:     Rate and Rhythm: Normal rate and regular rhythm.  Pulmonary:     Effort: Pulmonary effort is normal. No respiratory distress.     Breath sounds: Normal breath sounds. No wheezing, rhonchi or rales.  Musculoskeletal:     Cervical back: Neck supple.  Lymphadenopathy:     Cervical: No cervical adenopathy.  Skin:    General: Skin is warm and dry.  Neurological:     General: No focal deficit present.     Mental Status: She is alert and oriented to person, place, and time.  Psychiatric:        Mood and Affect: Mood normal.        Behavior: Behavior normal.        Thought Content: Thought content normal.           Assessment & Plan:   COVID- new.  Pt's sxs are consistent w/ infxn and rapid test confirms.  Start Paxlovid.  Pt instructed to hold statin.  Reviewed supportive care and red flags that should prompt return.  Pt expressed understanding and is in agreement w/ plan.

## 2022-11-19 ENCOUNTER — Other Ambulatory Visit: Payer: Self-pay | Admitting: Family Medicine

## 2023-01-12 ENCOUNTER — Other Ambulatory Visit: Payer: Self-pay | Admitting: Family Medicine

## 2023-02-09 ENCOUNTER — Other Ambulatory Visit: Payer: Self-pay | Admitting: Family Medicine

## 2023-02-20 ENCOUNTER — Other Ambulatory Visit: Payer: Self-pay | Admitting: Family Medicine

## 2023-02-22 ENCOUNTER — Ambulatory Visit (INDEPENDENT_AMBULATORY_CARE_PROVIDER_SITE_OTHER): Payer: Medicare HMO | Admitting: Family Medicine

## 2023-02-22 VITALS — BP 118/62 | HR 78 | Temp 97.4°F | Wt 134.8 lb

## 2023-02-22 DIAGNOSIS — E782 Mixed hyperlipidemia: Secondary | ICD-10-CM | POA: Diagnosis not present

## 2023-02-22 DIAGNOSIS — I10 Essential (primary) hypertension: Secondary | ICD-10-CM | POA: Diagnosis not present

## 2023-02-22 MED ORDER — LISINOPRIL 10 MG PO TABS: 10.0000 mg | ORAL_TABLET | Freq: Every day | ORAL | 1 refills | Status: DC

## 2023-02-22 NOTE — Progress Notes (Signed)
 Patient ID: AARAVI HOSFORD, female  DOB: 1951/01/25, 73 y.o.   MRN: [id number] Patient Care Team    Relationship Specialty Notifications Start End  Mariel Shope, DO PCP - General Family Medicine  04/10/17   Evangeline Hilts, MD Consulting Physician Gastroenterology  06/19/19     Chief Complaint  Patient presents with   Hypertension    [facility]; meds taken 20 mins ago    Subjective: Angela [name] is a 73 y.o.  Female  present for [facility] All past medical history, surgical history, allergies, family history, immunizations, medications and social history were updated in the electronic medical record today. All recent labs, ED visits and hospitalizations within the last year were reviewed.   Hypertension:  Pt reports compliance with lisinopril  10 mg .  Patient denies chest pain, shortness of breath, dizziness or lower extremity edema.   Pt is not prescribed statin. RF: Hypertension, former smoker, family history heart disease      02/22/2023    7:48 AM 04/13/2022    8:37 AM 03/24/2021    1:49 PM 03/18/2020    8:17 AM 01/22/2020    8:19 AM  Depression screen PHQ 2/9  Decreased Interest 0 0 0 0 0  Down, Depressed, Hopeless 0 0 0 0 0  PHQ - 2 Score 0 0 0 0 0       No data to display           Immunization History  Administered Date(s) Administered   Fluad Quad(high Dose 65+) 10/23/2020, 10/26/2021   Influenza, High Dose Seasonal PF 10/14/2016, 11/30/2017, 11/02/2018   Influenza-Unspecified 01/08/2001, 11/13/2001, 11/20/2012, 11/08/2019   PFIZER(Purple Top)SARS-COV-2 Vaccination 03/17/2019, 04/11/2019, 01/19/2020   PNEUMOCOCCAL CONJUGATE-20 10/23/2020   Pneumococcal Conjugate-13 06/19/2019   Td 02/07/1998   Tdap 04/10/2017   Zoster Recombinant(Shingrix ) 04/10/2017, 09/12/2017    Past Medical History:  Diagnosis Date   Arthritis    Blood in stool    greater than 4 years since last incidence of blood in stool    Colon polyps    Glaucoma    Hyperlipidemia     Hypertension    Inclusion cyst 2021   face   PNA (pneumonia) 2006   Strain of left wrist 04/16/2019   Allergies  Allergen Reactions   Codeine Other (See Comments)    Irritable , insomnia    Grass Pollen(K-O-R-T-Swt Vern) Other (See Comments)    Trees    Pollen Extract    Past Surgical History:  Procedure Laterality Date   ABSCESS DRAINAGE  2021   face- inclusion cyst   BREAST BIOPSY Left 07/19/2018   US  guided breast bx with clip placement- benign   EYE SURGERY  1956   HEMORRHOID SURGERY  2001   RADIOACTIVE SEED GUIDED EXCISIONAL BREAST BIOPSY Left 03/15/2019   Procedure: RADIOACTIVE SEED GUIDED EXCISIONAL LEFT  BREAST BIOPSY;  Surgeon: Jacolyn Matar, MD;  Location: [facility];  Service: General;  Laterality: Left;   TONSILLECTOMY  1963   WISDOM TOOTH EXTRACTION     Family History  Problem Relation Age of Onset   Heart attack Father    Heart attack Sister    Hyperlipidemia Sister    Hypertension Sister    Social History   Social History Narrative   Married. Retired. 2 children.   Bachelors degree.   Exercises routinely.   Drinks caffeine.   Smoke alarm in the home. Wears her seatbelt. Owns firearms.   Feels safe in her relationships.  Allergies as of 02/22/2023       Reactions   Codeine Other (See Comments)   Irritable , insomnia    Grass Pollen(k-o-r-t-swt Vern) Other (See Comments)   Trees   Pollen Extract         Medication List        Accurate as of February 22, 2023  8:09 AM. If you have any questions, ask your nurse or doctor.          Abrysvo 120 MCG/0.5ML injection Generic drug: RSV bivalent vaccine   albuterol  108 (90 Base) MCG/ACT inhaler Commonly known as: VENTOLIN  HFA Inhale 2 puffs into the lungs every 6 (six) hours as needed for wheezing or shortness of breath.   atorvastatin  20 MG tablet Commonly known as: LIPITOR Take 1 tablet (20 mg total) by mouth at bedtime.   DAILY PROBIOTIC PO Take by mouth.    FIBER-CAPS PO Take by mouth.   latanoprost 0.005 % ophthalmic solution Commonly known as: XALATAN Place 1 drop into both eyes at bedtime.   lisinopril  10 MG tablet Commonly known as: ZESTRIL  Take 1 tablet (10 mg total) by mouth daily.   multivitamin tablet Take 1 tablet by mouth daily.   timolol 0.5 % ophthalmic solution Commonly known as: TIMOPTIC        All past medical history, surgical history, allergies, family history, immunizations andmedications were updated in the EMR today and reviewed under the history and medication portions of their EMR.      ROS: 14 pt review of systems performed and negative (unless mentioned in an HPI)  Objective: BP 118/62   Pulse 78   Temp (!) 97.4 F (36.3 C)   Wt 134 lb 12.8 oz (61.1 kg)   SpO2 98%   BMI 23.14 kg/m  Physical Exam Vitals and nursing note reviewed.  Constitutional:      General: She is not in acute distress.    Appearance: Normal appearance. She is not ill-appearing, toxic-appearing or diaphoretic.  HENT:     Head: Normocephalic and atraumatic.  Eyes:     General: No scleral icterus.       Right eye: No discharge.        Left eye: No discharge.     Extraocular Movements: Extraocular movements intact.     Conjunctiva/sclera: Conjunctivae normal.     Pupils: Pupils are equal, round, and reactive to light.  Cardiovascular:     Rate and Rhythm: Normal rate and regular rhythm.     Heart sounds: No murmur heard. Pulmonary:     Effort: Pulmonary effort is normal. No respiratory distress.     Breath sounds: Normal breath sounds. No wheezing, rhonchi or rales.  Musculoskeletal:     Cervical back: Neck supple. No tenderness.     Right lower leg: No edema.     Left lower leg: No edema.  Lymphadenopathy:     Cervical: No cervical adenopathy.  Skin:    General: Skin is warm and dry.     Coloration: Skin is not jaundiced or pale.     Findings: No erythema or rash.  Neurological:     Mental Status: She is alert  and oriented to person, place, and time. Mental status is at baseline.     Motor: No weakness.     Gait: Gait normal.  Psychiatric:        Mood and Affect: Mood normal.        Behavior: Behavior normal.  Thought Content: Thought content normal.        Judgment: Judgment normal.    No results found.  Assessment/plan: LARALEE GREENWALT is a 73 y.o. female present for [facility] Essential hypertension Stable Continue   lisinopril  10 mg  -Routine exercise encourage -Low-sodium heart healthy diet encouraged Labs UTD Follow-up 5.5 mos, sooner if blood pressures above goal.  elevated a1c: Dietary changes and increase exercise A1c 6.0 11/2022  Leukocytosis:  Elevated wbc 13.9>11.9 > likely 2/2 to smoking.   Follow yearly, unless rising above her baseline    Return in about 24 weeks (around 08/09/2023).  No orders of the defined types were placed in this encounter.  Meds ordered this encounter  Medications   lisinopril  (ZESTRIL ) 10 MG tablet    Sig: Take 1 tablet (10 mg total) by mouth daily.    Dispense:  90 tablet    Refill:  1    Referral Orders  No referral(s) requested today     Electronically signed by: Napolean Backbone, DO [facility] [place]

## 2023-02-22 NOTE — Patient Instructions (Signed)

## 2023-04-04 DIAGNOSIS — M545 Low back pain, unspecified: Secondary | ICD-10-CM | POA: Diagnosis not present

## 2023-04-04 DIAGNOSIS — M25551 Pain in right hip: Secondary | ICD-10-CM | POA: Diagnosis not present

## 2023-04-19 ENCOUNTER — Ambulatory Visit: Payer: Medicare HMO | Admitting: *Deleted

## 2023-04-19 DIAGNOSIS — Z0000 Encounter for general adult medical examination without abnormal findings: Secondary | ICD-10-CM | POA: Diagnosis not present

## 2023-04-19 DIAGNOSIS — Z780 Asymptomatic menopausal state: Secondary | ICD-10-CM | POA: Diagnosis not present

## 2023-04-19 NOTE — Patient Instructions (Signed)
 Angela [name] , Thank you for taking time to come for your Medicare Wellness Visit. I appreciate your ongoing commitment to your health goals. Please review the following plan we discussed and let me know if I can assist you in the future.   Screening recommendations/referrals: Colonoscopy: up to date Mammogram: up to date Bone Density: Education provided Recommended yearly ophthalmology/optometry visit for glaucoma screening and checkup Recommended yearly dental visit for hygiene and checkup  Vaccinations: Influenza vaccine: up to date Pneumococcal vaccine: up to date Tdap vaccine: up to date Shingles vaccine: up to date       Preventive Care 65 Years and Older, Female Preventive care refers to lifestyle choices and visits with your health care provider that can promote health and wellness. What does preventive care include? A yearly physical exam. This is also called an annual well check. Dental exams once or twice a year. Routine eye exams. Ask your health care provider how often you should have your eyes checked. Personal lifestyle choices, including: Daily care of your teeth and gums. Regular physical activity. Eating a healthy diet. Avoiding tobacco and drug use. Limiting alcohol use. Practicing safe sex. Taking low-dose aspirin every day. Taking vitamin and mineral supplements as recommended by your health care provider. What happens during an annual well check? The services and screenings done by your health care provider during your annual well check will depend on your age, overall health, lifestyle risk factors, and family history of disease. Counseling  Your health care provider may ask you questions about your: Alcohol use. Tobacco use. Drug use. Emotional well-being. Home and relationship well-being. Sexual activity. Eating habits. History of falls. Memory and ability to understand (cognition). Work and work Astronomer. Reproductive health. Screening  You  may have the following tests or measurements: Height, weight, and BMI. Blood pressure. Lipid and cholesterol levels. These may be checked every 5 years, or more frequently if you are over 101 years old. Skin check. Lung cancer screening. You may have this screening every year starting at age 28 if you have a 30-pack-year history of smoking and currently smoke or have quit within the past 15 years. Fecal occult blood test (FOBT) of the stool. You may have this test every year starting at age 40. Flexible sigmoidoscopy or colonoscopy. You may have a sigmoidoscopy every 5 years or a colonoscopy every 10 years starting at age 56. Hepatitis C blood test. Hepatitis B blood test. Sexually transmitted disease (STD) testing. Diabetes screening. This is done by checking your blood sugar (glucose) after you have not eaten for a while (fasting). You may have this done every 1-3 years. Bone density scan. This is done to screen for osteoporosis. You may have this done starting at age 21. Mammogram. This may be done every 1-2 years. Talk to your health care provider about how often you should have regular mammograms. Talk with your health care provider about your test results, treatment options, and if necessary, the need for more tests. Vaccines  Your health care provider may recommend certain vaccines, such as: Influenza vaccine. This is recommended every year. Tetanus, diphtheria, and acellular pertussis (Tdap, Td) vaccine. You may need a Td booster every 10 years. Zoster vaccine. You may need this after age 31. Pneumococcal 13-valent conjugate (PCV13) vaccine. One dose is recommended after age 28. Pneumococcal polysaccharide (PPSV23) vaccine. One dose is recommended after age 37. Talk to your health care provider about which screenings and vaccines you need and how often you need them. This  information is not intended to replace advice given to you by your health care provider. Make sure you discuss any  questions you have with your health care provider. Document Released: 02/20/2015 Document Revised: 10/14/2015 Document Reviewed: 11/25/2014 Elsevier Interactive Patient Education  2017 [employer].  Fall Prevention in the Home Falls can cause injuries. They can happen to people of all ages. There are many things you can do to make your home safe and to help prevent falls. What can I do on the outside of my home? Regularly fix the edges of walkways and driveways and fix any cracks. Remove anything that might make you trip as you walk through a door, such as a raised step or threshold. Trim any bushes or trees on the path to your home. Use bright outdoor lighting. Clear any walking paths of anything that might make someone trip, such as rocks or tools. Regularly check to see if handrails are loose or broken. Make sure that both sides of any steps have handrails. Any raised decks and porches should have guardrails on the edges. Have any leaves, snow, or ice cleared regularly. Use sand or salt on walking paths during winter. Clean up any spills in your garage right away. This includes oil or grease spills. What can I do in the bathroom? Use night lights. Install grab bars by the toilet and in the tub and shower. Do not use towel bars as grab bars. Use non-skid mats or decals in the tub or shower. If you need to sit down in the shower, use a plastic, non-slip stool. Keep the floor dry. Clean up any water that spills on the floor as soon as it happens. Remove soap buildup in the tub or shower regularly. Attach bath mats securely with double-sided non-slip rug tape. Do not have throw rugs and other things on the floor that can make you trip. What can I do in the bedroom? Use night lights. Make sure that you have a light by your bed that is easy to reach. Do not use any sheets or blankets that are too big for your bed. They should not hang down onto the floor. Have a firm chair that has side  arms. You can use this for support while you get dressed. Do not have throw rugs and other things on the floor that can make you trip. What can I do in the kitchen? Clean up any spills right away. Avoid walking on wet floors. Keep items that you use a lot in easy-to-reach places. If you need to reach something above you, use a strong step stool that has a grab bar. Keep electrical cords out of the way. Do not use floor polish or wax that makes floors slippery. If you must use wax, use non-skid floor wax. Do not have throw rugs and other things on the floor that can make you trip. What can I do with my stairs? Do not leave any items on the stairs. Make sure that there are handrails on both sides of the stairs and use them. Fix handrails that are broken or loose. Make sure that handrails are as long as the stairways. Check any carpeting to make sure that it is firmly attached to the stairs. Fix any carpet that is loose or worn. Avoid having throw rugs at the top or bottom of the stairs. If you do have throw rugs, attach them to the floor with carpet tape. Make sure that you have a light switch at the top  of the stairs and the bottom of the stairs. If you do not have them, ask someone to add them for you. What else can I do to help prevent falls? Wear shoes that: Do not have high heels. Have rubber bottoms. Are comfortable and fit you well. Are closed at the toe. Do not wear sandals. If you use a stepladder: Make sure that it is fully opened. Do not climb a closed stepladder. Make sure that both sides of the stepladder are locked into place. Ask someone to hold it for you, if possible. Clearly mark and make sure that you can see: Any grab bars or handrails. First and last steps. Where the edge of each step is. Use tools that help you move around (mobility aids) if they are needed. These include: Canes. Walkers. Scooters. Crutches. Turn on the lights when you go into a dark area.  Replace any light bulbs as soon as they burn out. Set up your furniture so you have a clear path. Avoid moving your furniture around. If any of your floors are uneven, fix them. If there are any pets around you, be aware of where they are. Review your medicines with your doctor. Some medicines can make you feel dizzy. This can increase your chance of falling. Ask your doctor what other things that you can do to help prevent falls. This information is not intended to replace advice given to you by your health care provider. Make sure you discuss any questions you have with your health care provider. Document Released: 11/20/2008 Document Revised: 07/02/2015 Document Reviewed: 02/28/2014 Elsevier Interactive Patient Education  2017 [employer].

## 2023-04-19 NOTE — Progress Notes (Cosign Needed Addendum)
 Subjective:   Angela [name] is a 73 y.o. female who presents for Medicare Annual (Subsequent) preventive examination.  Visit Complete: Virtual I connected with  Angela [name] on 04/19/23 by a audio enabled telemedicine application and verified that I am speaking with the correct person using two identifiers.  Patient Location: Home  Provider Location: Home Office  I discussed the limitations of evaluation and management by telemedicine. The patient expressed understanding and agreed to proceed.  Vital Signs: Because this visit was a virtual/telehealth visit, some criteria may be missing or patient reported. Any vitals not documented were not able to be obtained and vitals that have been documented are patient reported.  Patient Medicare AWV questionnaire was completed by the patient on 04-18-2023; I have confirmed that all information answered by patient is correct and no changes since this date.  Cardiac Risk Factors include: advanced age (>81men, >63 women);hypertension     Objective:    There were no vitals filed for this visit. There is no height or weight on file to calculate BMI.     04/19/2023    8:57 AM 04/13/2022    8:39 AM 03/24/2021    1:50 PM 03/18/2020    8:15 AM 03/15/2019    6:28 AM  Advanced Directives  Does Patient Have a Medical Advance Directive? Yes Yes Yes Yes Yes  Type of Estate agent of [employer] Power of [place];Living will Healthcare Power of [employer] of [place];Living will Healthcare Power of [place];Living will  Copy of Healthcare Power of Attorney in Chart? Yes - validated most recent copy scanned in chart (See row information) No - copy requested  Yes - validated most recent copy scanned in chart (See row information) No - copy requested    Current Medications (verified) Outpatient Encounter Medications as of 04/19/2023  Medication Sig   ABRYSVO 120 MCG/0.5ML injection    Calcium Polycarbophil  (FIBER-CAPS PO) Take by mouth.   latanoprost (XALATAN) 0.005 % ophthalmic solution Place 1 drop into both eyes at bedtime.   lisinopril (ZESTRIL) 10 MG tablet Take 1 tablet (10 mg total) by mouth daily.   Multiple Vitamin (MULTIVITAMIN) tablet Take 1 tablet by mouth daily.   Probiotic Product (DAILY PROBIOTIC PO) Take by mouth.   timolol (TIMOPTIC) 0.5 % ophthalmic solution    albuterol (VENTOLIN HFA) 108 (90 Base) MCG/ACT inhaler Inhale 2 puffs into the lungs every 6 (six) hours as needed for wheezing or shortness of breath.   atorvastatin (LIPITOR) 20 MG tablet Take 1 tablet (20 mg total) by mouth at bedtime. (Patient not taking: Reported on 04/19/2023)   No facility-administered encounter medications on file as of 04/19/2023.    Allergies (verified) Codeine, Grass pollen([id number] vern), and Pollen extract   History: Past Medical History:  Diagnosis Date   Arthritis    Blood in stool    greater than 4 years since last incidence of blood in stool    Colon polyps    Glaucoma    Hyperlipidemia    Hypertension    Inclusion cyst 2021   face   PNA (pneumonia) 2006   Strain of left wrist 04/16/2019   Past Surgical History:  Procedure Laterality Date   ABSCESS DRAINAGE  2021   face- inclusion cyst   BREAST BIOPSY Left 07/19/2018   US guided breast bx with clip placement- benign   EYE SURGERY  1956   HEMORRHOID SURGERY  2001   RADIOACTIVE SEED GUIDED EXCISIONAL BREAST BIOPSY Left 03/15/2019  Procedure: RADIOACTIVE SEED GUIDED EXCISIONAL LEFT  BREAST BIOPSY;  Surgeon: Luretha Murphy, MD;  Location: [facility];  Service: General;  Laterality: Left;   TONSILLECTOMY  1963   WISDOM TOOTH EXTRACTION     Family History  Problem Relation Age of Onset   Heart attack Father    Heart attack Sister    Hyperlipidemia Sister    Hypertension Sister    Social History   Socioeconomic History   Marital status: Married    Spouse name: Not on file   Number of children: Not  on file   Years of education: Not on file   Highest education level: Bachelor's degree (e.g., BA, AB, BS)  Occupational History   Not on file  Tobacco Use   Smoking status: Former    Current packs/day: 0.00    Types: Cigars, Cigarettes   Smokeless tobacco: Never   Tobacco comments:    *occasional smoker   Vaping Use   Vaping status: Former  Substance and Sexual Activity   Alcohol use: Yes    Alcohol/week: 4.0 standard drinks of alcohol    Types: 4 Glasses of wine per week    Comment: *rarely    Drug use: No   Sexual activity: Yes    Partners: Male    Comment: Married  Other Topics Concern   Not on file  Social History Narrative   Married. Retired. 2 children.   Bachelors degree.   Exercises routinely.   Drinks caffeine.   Smoke alarm in the home. Wears her seatbelt. Owns firearms.   Feels safe in her relationships.   Social Drivers of Health   Financial Resource Strain: Patient Declined (04/19/2023)   Overall Financial Resource Strain (CARDIA)    Difficulty of Paying Living Expenses: Patient declined  Food Insecurity: Patient Declined (04/19/2023)   Hunger Vital Sign    Worried About Running Out of Food in the Last Year: Patient declined    Ran Out of Food in the Last Year: Patient declined  Transportation Needs: No Transportation Needs (04/19/2023)   PRAPARE - Administrator, Civil Service (Medical): No    Lack of Transportation (Non-Medical): No  Physical Activity: Sufficiently Active (04/19/2023)   Exercise Vital Sign    Days of Exercise per Week: 5 days    Minutes of Exercise per Session: 30 min  Stress: No Stress Concern Present (04/19/2023)   [employer] of Occupational Health - Occupational Stress Questionnaire    Feeling of Stress : Not at all  Social Connections: Unknown (04/19/2023)   Social Connection and Isolation Panel [NHANES]    Frequency of Communication with Friends and Family: Patient declined    Frequency of Social Gatherings with  Friends and Family: Patient declined    Attends Religious Services: Patient declined    Database administrator or Organizations: Patient declined    Attends Banker Meetings: Never    Marital Status: Married    Tobacco Counseling Counseling given: Not Answered Tobacco comments: *occasional smoker    Clinical Intake:  Pre-visit preparation completed: Yes  Pain : No/denies pain     Diabetes: No  How often do you need to have someone help you when you read instructions, pamphlets, or other written materials from your doctor or pharmacy?: 1 - Never  Interpreter Needed?: No  Information entered by :: Remi Haggard LPN   Activities of Daily Living    04/19/2023    9:07 AM 04/18/2023    2:37 PM  In your present state of health, do you have any difficulty performing the following activities:  Hearing? 0 0  Vision? 0 0  Difficulty concentrating or making decisions? 0 0  Walking or climbing stairs? 0 0  Dressing or bathing? 0 0  Doing errands, shopping? 0 0  Preparing Food and eating ? N N  Using the Toilet? N N  Do you have problems with loss of bowel control? N N  Managing your Medications? N N  Managing your Finances? N N  Housekeeping or managing your Housekeeping? N N    Patient Care Team: Natalia Leatherwood, DO as PCP - General (Family Medicine) Willis Modena, MD as Consulting Physician (Gastroenterology)  Indicate any recent Medical Services you may have received from other than [facility] providers in the past year (date may be approximate).     Assessment:   This is a routine wellness examination for Angela [name].  Hearing/Vision screen Hearing Screening - Comments:: No trouble hearing Vision Screening - Comments:: [facility] Up to date   Goals Addressed             This Visit's Progress    Patient Stated   On track    Stay active and healthy     Patient Stated   On track    Stay healthy      Patient Stated   On track    Continue current  lifestyle       Depression Screen    04/19/2023    8:57 AM 02/22/2023    7:48 AM 04/13/2022    8:37 AM 03/24/2021    1:49 PM 03/18/2020    8:17 AM 01/22/2020    8:19 AM 06/19/2019    9:33 AM  PHQ 2/9 Scores  PHQ - 2 Score 0 0 0 0 0 0 0  PHQ- 9 Score 0          Fall Risk    04/19/2023    8:54 AM 04/18/2023    2:37 PM 02/22/2023    7:48 AM 09/23/2022    7:51 AM 04/13/2022    8:48 AM  Fall Risk   Falls in the past year? 0 0 0 0 0  Number falls in past yr: 0  0  0  Injury with Fall? 0  0  0  Risk for fall due to :     Impaired vision  Follow up Falls evaluation completed;Education provided;Falls prevention discussed    Falls prevention discussed    MEDICARE RISK AT HOME: Medicare Risk at Home Any stairs in or around the home?: Yes If so, are there any without handrails?: No Home free of loose throw rugs in walkways, pet beds, electrical cords, etc?: Yes Adequate lighting in your home to reduce risk of falls?: Yes Life alert?: No Use of a cane, walker or w/c?: No Grab bars in the bathroom?: No Shower chair or bench in shower?: Yes Elevated toilet seat or a handicapped toilet?: No  TIMED UP AND GO:  Was the test performed?  No    Cognitive Function:        Immunizations Immunization History  Administered Date(s) Administered   Fluad Quad(high Dose 65+) 10/23/2020, 10/26/2021, 11/23/2022   Influenza, High Dose Seasonal PF 10/14/2016, 11/30/2017, 11/02/2018   Influenza-Unspecified 01/08/2001, 11/13/2001, 11/20/2012, 11/08/2019   PFIZER(Purple Top)SARS-COV-2 Vaccination 03/17/2019, 04/11/2019, 01/19/2020   PNEUMOCOCCAL CONJUGATE-20 10/23/2020   Pneumococcal Conjugate-13 06/19/2019   Td 02/07/1998   Tdap 04/10/2017   Zoster Recombinant(Shingrix) 04/10/2017, 09/12/2017  TDAP status: Up to date  Flu Vaccine status: Up to date  Pneumococcal vaccine status: Up to date  Covid-19 vaccine status: Information provided on how to obtain vaccines.   Qualifies for Shingles  Vaccine? No   Zostavax completed Yes   Shingrix Completed?: Yes  Screening Tests Health Maintenance  Topic Date Due   DEXA SCAN  05/12/2022   Medicare Annual Wellness (AWV)  04/18/2024   Colonoscopy  07/08/2024   MAMMOGRAM  08/09/2024   DTaP/Tdap/Td (3 - Td or Tdap) 04/11/2027   Pneumonia Vaccine 62+ Years old  Completed   INFLUENZA VACCINE  Completed   Hepatitis C Screening  Completed   Zoster Vaccines- Shingrix  Completed   HPV VACCINES  Aged Out   COVID-19 Vaccine  Discontinued    Health Maintenance  Health Maintenance Due  Topic Date Due   DEXA SCAN  05/12/2022    Colorectal cancer screening: Type of screening: Colonoscopy. Completed 2021. Repeat every 5 years  Mammogram status: Completed  . Repeat every year  Bone Density status: Ordered  . Pt provided with contact info and advised to call to schedule appt.  Lung Cancer Screening: (Low Dose CT Chest recommended if Age 64-80 years, 20 pack-year currently smoking OR have quit w/in 15years.) does not qualify.   Lung Cancer Screening Referral:   Additional Screening:  Hepatitis C Screening: does not qualify; Completed 2019  Vision Screening: Recommended annual ophthalmology exams for early detection of glaucoma and other disorders of the eye. Is the patient up to date with their annual eye exam?  Yes  Who is the provider or what is the name of the office in which the patient attends annual eye exams? [facility] If pt is not established with a provider, would they like to be referred to a provider to establish care? No .   Dental Screening: Recommended annual dental exams for proper oral hygiene    Community Resource Referral / Chronic Care Management: CRR required this visit?  No   CCM required this visit?  No     Plan:     I have personally reviewed and noted the following in the patient's chart:   Medical and social history Use of alcohol, tobacco or illicit drugs  Current medications and  supplements including opioid prescriptions. Patient is not currently taking opioid prescriptions. Functional ability and status Nutritional status Physical activity Advanced directives List of other physicians Hospitalizations, surgeries, and ER visits in previous 12 months Vitals Screenings to include cognitive, depression, and falls Referrals and appointments  In addition, I have reviewed and discussed with patient certain preventive protocols, quality metrics, and best practice recommendations. A written personalized care plan for preventive services as well as general preventive health recommendations were provided to patient.     Remi Haggard, LPN   1/61/0960   After Visit Summary: (MyChart) Due to this being a telephonic visit, the after visit summary with patients personalized plan was offered to patient via MyChart   Nurse Notes:

## 2023-04-20 DIAGNOSIS — M5416 Radiculopathy, lumbar region: Secondary | ICD-10-CM | POA: Diagnosis not present

## 2023-05-21 ENCOUNTER — Other Ambulatory Visit: Payer: Self-pay | Admitting: Family Medicine

## 2023-06-06 ENCOUNTER — Ambulatory Visit: Payer: Self-pay

## 2023-06-06 NOTE — Telephone Encounter (Signed)
 FYI has been reviewed.

## 2023-06-06 NOTE — Telephone Encounter (Signed)
 Summary: hemorrhoids   Copied From CRM [id number]. Reason for Triage: Patient is calling because she has hemorrhoids again Did not complain about any major pain. Would like a call  Chief Complaint: External hemorrhoid Symptoms: Bleeding, pain, itching Frequency: 2-3 weeks Pertinent Negatives: Patient denies relief Disposition: [] ED /[] Urgent Care (no appt availability in office) / [x] Appointment(In office/virtual)/ []  [facility]/ [] Home Care/ [] Refused Recommended Disposition /[] [facility]/ []  Follow-up with PCP Additional Notes: Patient called in to report a bothersome external hemorrhoid. Patient stated she has been dealing with symptoms for 2-3 weeks. Patient is experiencing rectal bleeding upon contact, itching and mild discomfort. Advised patient to be seen within 3 days, per protocol. Scheduled with PCP. Provided care advice and instructed patient to call back if symptoms worsen. Patient complied.   Reason for Disposition  [1] Home treatment > 3 days for rectal pain AND [2] not improved  Answer Assessment - Initial Assessment Questions 1. SYMPTOM:  "What's the main symptom you're concerned about?" (e.g., pain, itching, swelling, rash)     External hemorrhoid 2. ONSET: "When did the symptoms start?"     2-3 weeks 3. RECTAL PAIN: "Do you have any pain around your rectum?" "How bad is the pain?"  (Scale 0-10; or mild, moderate, severe)   - NONE (0): no pain   - MILD (1-3): doesn't interfere with normal activities    - MODERATE (4-7): interferes with normal activities or awakens from sleep, limping    - SEVERE (8-10): excruciating pain, unable to have a bowel movement      States pain comes and goes depending on BM, rates pain is a 7 when present 4. RECTAL ITCHING: "Do you have any itching in this area?" "How bad is the itching?"  (Scale 0-10; or mild, moderate, severe)   - NONE: no itching   - MILD: doesn't interfere with normal activities    - MODERATE-SEVERE:  interferes with normal activities or awakens from sleep     Mild 6. CAUSE: "What do you think is causing the anus symptoms?"     External hemorrhoid about the size of a marble  7. OTHER SYMPTOMS: "Do you have any other symptoms?"  (e.g., abdomen pain, fever, rectal bleeding, vomiting)     Bleeding and discomfort due to contact, ongoing constipation  Protocols used: Rectal Symptoms-A-AH

## 2023-06-08 ENCOUNTER — Ambulatory Visit (INDEPENDENT_AMBULATORY_CARE_PROVIDER_SITE_OTHER): Admitting: Family Medicine

## 2023-06-08 ENCOUNTER — Encounter: Payer: Self-pay | Admitting: Family Medicine

## 2023-06-08 VITALS — BP 110/60 | HR 78 | Temp 98.0°F | Wt 130.6 lb

## 2023-06-08 DIAGNOSIS — K649 Unspecified hemorrhoids: Secondary | ICD-10-CM

## 2023-06-08 MED ORDER — HYDROCORTISONE ACETATE 25 MG RE SUPP: 25.0000 mg | Freq: Two times a day (BID) | RECTAL | 0 refills | Status: DC

## 2023-06-08 MED ORDER — HYDROCORTISONE 1 % EX CREA: 1.0000 | TOPICAL_CREAM | Freq: Two times a day (BID) | CUTANEOUS | 0 refills | Status: DC

## 2023-06-08 NOTE — Patient Instructions (Signed)
 Hemorrhoids Hemorrhoids are swollen veins that may form: In the butt (rectum). These are called internal hemorrhoids. Around the opening of the butt (anus). These are called external hemorrhoids. Most hemorrhoids do not cause very bad problems. They often get better with changes to your lifestyle and what you eat. What are the causes? Having trouble pooping (constipation) or watery poop (diarrhea). Pushing too hard when you poop. Pregnancy. Being very overweight (obese). Sitting for too long. Riding a bike for a long time. Heavy lifting or other things that take a lot of effort. Anal sex. What are the signs or symptoms? Pain. Itching or soreness in the butt. Bleeding from the butt. Leaking poop. Swelling. One or more lumps around the opening of your butt. How is this treated? In most cases, hemorrhoids can be treated at home. You may be told to: Change what you eat. Make changes to your lifestyle. If these treatments do not help, you may need to have a procedure done. Your doctor may need to: Place rubber bands at the bottom of the hemorrhoids to make them fall off. Put medicine into the hemorrhoids to shrink them. Shine a type of light on the hemorrhoids to cause them to fall off. Do surgery to get rid of the hemorrhoids. Follow these instructions at home: Medicines Take over-the-counter and prescription medicines only as told by your doctor. Use creams with medicine in them or medicines that you put in your butt as told by your doctor. Eating and drinking  Eat foods that have a lot of fiber in them. These include whole grains, beans, nuts, fruits, and vegetables. Ask your doctor about taking products that have fiber added to them (fibersupplements). Take in less fat. You can do this by: Eating low-fat dairy products. Eating less red meat. Staying away from processed foods. Drink enough fluid to keep your pee (urine) pale yellow. Managing pain and swelling  Take a  warm-water bath (sitz bath) for 20 minutes to ease pain. Do this 3-4 times a day. You may do this in a bathtub. You may also use a portable sitz bath that fits over the toilet. If told, put ice on the painful area. It may help to use ice between your warm baths. Put ice in a plastic bag. Place a towel between your skin and the bag. Leave the ice on for 20 minutes, 2-3 times a day. If your skin turns bright red, take off the ice right away to prevent skin damage. The risk of damage is higher if you cannot feel pain, heat, or cold. General instructions Exercise. Ask your doctor how much and what kind of exercise is best for you. Go to the bathroom when you need to poop. Do not wait. Try not to push too hard when you poop. Keep your butt dry and clean. Use wet toilet paper or moist towelettes after you poop. Do not sit on the toilet for a long time. Contact a doctor if: You have pain and swelling that do not get better with treatment. You have trouble pooping. You cannot poop. You have pain or swelling outside the area of the hemorrhoids. Get help right away if: You have bleeding from the butt that will not stop. This information is not intended to replace advice given to you by your health care provider. Make sure you discuss any questions you have with your health care provider. Document Revised: 10/06/2021 Document Reviewed: 10/06/2021 Elsevier Patient Education  2024 [employer].

## 2023-06-08 NOTE — Progress Notes (Signed)
 Angela [name] , 1950-10-18, 73 y.o., female MRN: [id number] Patient Care Team    Relationship Specialty Notifications Start End  Mariel Shope, DO PCP - General Family Medicine  04/10/17   Evangeline Hilts, MD Consulting Physician Gastroenterology  06/19/19     Chief Complaint  Patient presents with   Rectal Pain    Hemorrhoid present for the last 2 weeks     Subjective: Angela [name] is a 73 y.o. Pt presents for an OV with complaints of rectal pain consistent with her hemorrhoid pain for about 2-3 weeks.  She reports she had the norovirus about 3 weeks ago, which was associated with diarrhea.  Since that time she had a flare of a hemorrhoid.  She has been using over-the-counter Preparation H, which has not resolved the issue.  She states she has had many hemorrhoids in the past, and had had them removed.  She denies any fevers, chills or bright red blood per rectum.      04/19/2023    8:57 AM 02/22/2023    7:48 AM 04/13/2022    8:37 AM 03/24/2021    1:49 PM 03/18/2020    8:17 AM  Depression screen PHQ 2/9  Decreased Interest 0 0 0 0 0  Down, Depressed, Hopeless 0 0 0 0 0  PHQ - 2 Score 0 0 0 0 0  Altered sleeping 0      Tired, decreased energy 0      Change in appetite 0      Feeling bad or failure about yourself  0      Trouble concentrating 0      Moving slowly or fidgety/restless 0      Suicidal thoughts 0      PHQ-9 Score 0      Difficult doing work/chores Not difficult at all        Allergies  Allergen Reactions   Codeine Other (See Comments)    Irritable , insomnia    Grass Pollen(K-O-R-T-Swt Vern) Other (See Comments)    Trees    Pollen Extract    Social History   Social History Narrative   Married. Retired. 2 children.   Bachelors degree.   Exercises routinely.   Drinks caffeine.   Smoke alarm in the home. Wears her seatbelt. Owns firearms.   Feels safe in her relationships.   Past Medical History:  Diagnosis Date   Arthritis    Blood in stool     greater than 4 years since last incidence of blood in stool    Colon polyps    Glaucoma    Hyperlipidemia    Hypertension    Inclusion cyst 2021   face   PNA (pneumonia) 2006   Strain of left wrist 04/16/2019   Past Surgical History:  Procedure Laterality Date   ABSCESS DRAINAGE  2021   face- inclusion cyst   BREAST BIOPSY Left 07/19/2018   US  guided breast bx with clip placement- benign   EYE SURGERY  1956   HEMORRHOID SURGERY  2001   RADIOACTIVE SEED GUIDED EXCISIONAL BREAST BIOPSY Left 03/15/2019   Procedure: RADIOACTIVE SEED GUIDED EXCISIONAL LEFT  BREAST BIOPSY;  Surgeon: Jacolyn Matar, MD;  Location: [facility];  Service: General;  Laterality: Left;   TONSILLECTOMY  1963   WISDOM TOOTH EXTRACTION     Family History  Problem Relation Age of Onset   Heart attack Father    Heart attack Sister  Hyperlipidemia Sister    Hypertension Sister    Allergies as of 06/08/2023       Reactions   Codeine Other (See Comments)   Irritable , insomnia    Grass Pollen(k-o-r-t-swt Vern) Other (See Comments)   Trees   Pollen Extract         Medication List        Accurate as of Jun 08, 2023  1:51 PM. If you have any questions, ask your nurse or doctor.          Abrysvo 120 MCG/0.5ML injection Generic drug: RSV bivalent vaccine   albuterol  108 (90 Base) MCG/ACT inhaler Commonly known as: VENTOLIN  HFA Inhale 2 puffs into the lungs every 6 (six) hours as needed for wheezing or shortness of breath.   atorvastatin  20 MG tablet Commonly known as: LIPITOR Take 1 tablet (20 mg total) by mouth at bedtime.   DAILY PROBIOTIC PO Take by mouth.   FIBER-CAPS PO Take by mouth.   hydrocortisone  25 MG suppository Commonly known as: ANUSOL -HC Place 1 suppository (25 mg total) rectally 2 (two) times daily. Started by: Kahlil Cowans   hydrocortisone  cream 1 % Apply 1 Application topically 2 (two) times daily. Started by: Napolean Backbone   latanoprost 0.005 %  ophthalmic solution Commonly known as: XALATAN Place 1 drop into both eyes at bedtime.   lisinopril  10 MG tablet Commonly known as: ZESTRIL  TAKE 1 TABLET BY MOUTH DAILY   multivitamin tablet Take 1 tablet by mouth daily.   timolol 0.5 % ophthalmic solution Commonly known as: TIMOPTIC        All past medical history, surgical history, allergies, family history, immunizations andmedications were updated in the EMR today and reviewed under the history and medication portions of their EMR.     ROS Negative, with the exception of above mentioned in HPI   Objective:  BP 110/60   Pulse 78   Temp 98 F (36.7 C)   Wt 130 lb 9.6 oz (59.2 kg)   SpO2 98%   BMI 22.42 kg/m  Body mass index is 22.42 kg/m.  Physical Exam Vitals and nursing note reviewed.  Constitutional:      General: She is not in acute distress.    Appearance: Normal appearance. She is normal weight. She is not ill-appearing or toxic-appearing.  HENT:     Head: Normocephalic and atraumatic.  Eyes:     General: No scleral icterus.       Right eye: No discharge.        Left eye: No discharge.     Extraocular Movements: Extraocular movements intact.     Conjunctiva/sclera: Conjunctivae normal.     Pupils: Pupils are equal, round, and reactive to light.  Genitourinary:    Comments: Anorectal: No erythema, no fluctuance, no thrombosis, no drainage or bleeding present.  Moderate sized swollen hemorrhoid present 6 o'clock position of anus.  Patient has many external hemorrhoids surrounding the entire anal opening that are nonswollen. Skin:    Findings: No rash.  Neurological:     Mental Status: She is alert and oriented to person, place, and time. Mental status is at baseline.     Motor: No weakness.     Coordination: Coordination normal.     Gait: Gait normal.  Psychiatric:        Mood and Affect: Mood normal.        Behavior: Behavior normal.        Thought Content: Thought content normal.  Judgment:  Judgment normal.      No results found. No results found. No results found for this or any previous visit (from the past 24 hours).  Assessment/Plan: Angela [name] is a 73 y.o. female present for OV for  Acute hemorrhoid: No signs of infection or thrombosis today. Discussed warm water/Epsom salt soaks or sitz bath 1-2 times a day. Proctocort  suppository 1-2 times a day and Proctocort  cream twice daily prescribed Emergent precautions discussed with patient today. Follow-up as needed Reviewed expectations re: course of current medical issues. Discussed self-management of symptoms. Outlined signs and symptoms indicating need for more acute intervention. Patient verbalized understanding and all questions were answered. Patient received an After-Visit Summary.    No orders of the defined types were placed in this encounter.  Meds ordered this encounter  Medications   hydrocortisone  cream 1 %    Sig: Apply 1 Application topically 2 (two) times daily.    Dispense:  30 g    Refill:  0   hydrocortisone  (ANUSOL -HC) 25 MG suppository    Sig: Place 1 suppository (25 mg total) rectally 2 (two) times daily.    Dispense:  12 suppository    Refill:  0    Generic/formulary substitution- cheapest for her please   Referral Orders  No referral(s) requested today     Note is dictated utilizing voice recognition software. Although note has been proof read prior to signing, occasional typographical errors still can be missed. If any questions arise, please do not hesitate to call for verification.   electronically signed by:  Napolean Backbone, DO  [facility] [place]

## 2023-06-21 ENCOUNTER — Ambulatory Visit: Payer: Self-pay

## 2023-06-21 NOTE — Telephone Encounter (Signed)
No further action needed. Pt scheduled.

## 2023-06-21 NOTE — Telephone Encounter (Signed)
 Patient states she was seen in the office two weeks ago for painful hemorrhoids. Patient states she was given 12 days of cortisone suppository and creams, and has been trying sitz bath and OTC pain reliever, and not having any relief at all. Patient was advised by PCP to follow up and come back in if symptoms did not improve. Appt made for follow up for Friday (earliest availability). Patient asked to be placed on wait list in case follow up appt can be done sooner. Patient placed on wait list.  Copied from CRM [id number]. Topic: Clinical - Red Word Triage >> Jun 21, 2023  8:14 AM Marlan Silva wrote: Red Word that prompted transfer to Nurse Triage: Patient called in complaining of painful Hemorrhoids. Patient requested to speak with a nurse. Reason for Disposition  [1] Follow-up call from patient regarding patient's clinical status AND [2] information NON-URGENT  Answer Assessment - Initial Assessment Questions 1. REASON FOR CALL or QUESTION: "What is your reason for calling today?" or "How can I best help you?" or "What question do you have that I can help answer?"     Painful hemorrhoid follow up 2. CALLER: Document the source of call. (e.g., laboratory, patient).     patient  Protocols used: PCP Call - No Triage-A-AH

## 2023-06-23 ENCOUNTER — Ambulatory Visit (INDEPENDENT_AMBULATORY_CARE_PROVIDER_SITE_OTHER): Admitting: Family Medicine

## 2023-06-23 ENCOUNTER — Encounter: Payer: Self-pay | Admitting: Family Medicine

## 2023-06-23 VITALS — BP 110/68 | HR 84 | Temp 97.9°F | Wt 129.2 lb

## 2023-06-23 DIAGNOSIS — K6289 Other specified diseases of anus and rectum: Secondary | ICD-10-CM

## 2023-06-23 DIAGNOSIS — K649 Unspecified hemorrhoids: Secondary | ICD-10-CM | POA: Diagnosis not present

## 2023-06-23 MED ORDER — AMOXICILLIN-POT CLAVULANATE 875-125 MG PO TABS: 1.0000 | ORAL_TABLET | Freq: Two times a day (BID) | ORAL | 0 refills | Status: DC

## 2023-06-23 MED ORDER — HYDROCODONE-ACETAMINOPHEN 5-325 MG PO TABS: 1.0000 | ORAL_TABLET | Freq: Four times a day (QID) | ORAL | 0 refills | Status: DC | PRN

## 2023-06-23 MED ORDER — HYDROCORTISONE ACETATE 25 MG RE SUPP: 25.0000 mg | Freq: Two times a day (BID) | RECTAL | 0 refills | Status: DC

## 2023-06-23 NOTE — Progress Notes (Signed)
 Angela [name] , 1950-06-10, 73 y.o., female MRN: [id number] Patient Care Team    Relationship Specialty Notifications Start End  Mariel Shope, DO PCP - General Family Medicine  04/10/17   Evangeline Hilts, MD Consulting Physician Gastroenterology  06/19/19     Chief Complaint  Patient presents with   Rectal Pain    Pt feels issue has gotten worse instead of improving. Still c/o 7/10 pain.      Subjective: Angela [name] is a 73 y.o. Pt presents for an OV with complaints of worsening rectal pain .Patient was seen 06/08/2023 and provided with steroid suppositories creams recommended sitz bath.  Patient reports initially the symptoms were resolving.  She was using the rectal/steroid suppository and ran out over the weekend.  2-3 days later after stopping rectal suppositories she reports the hemorrhoid and discomfort came back and is actually larger now.  She had some mild bleeding present, on the toilet tissue after bowel movement, otherwise she is not visualized any melena.  She denies any fevers, but states she did have chills earlier this week.  Prior note consistent with her hemorrhoid pain for about 2-3 weeks.  She reports she had the norovirus about 3 weeks ago, which was associated with diarrhea.  Since that time she had a flare of a hemorrhoid.  She has been using over-the-counter Preparation H, which has not resolved the issue.  She states she has had many hemorrhoids in the past, and had had them removed.  She denies any fevers, chills or bright red blood per rectum.      06/23/2023    1:12 PM 04/19/2023    8:57 AM 02/22/2023    7:48 AM 04/13/2022    8:37 AM 03/24/2021    1:49 PM  Depression screen PHQ 2/9  Decreased Interest 0 0 0 0 0  Down, Depressed, Hopeless 0 0 0 0 0  PHQ - 2 Score 0 0 0 0 0  Altered sleeping 0 0     Tired, decreased energy 0 0     Change in appetite 0 0     Feeling bad or failure about yourself  0 0     Trouble concentrating 0 0     Moving slowly or  fidgety/restless 0 0     Suicidal thoughts 0 0     PHQ-9 Score 0 0     Difficult doing work/chores Not difficult at all Not difficult at all       Allergies  Allergen Reactions   Codeine Other (See Comments)    Irritable , insomnia    Grass Pollen(K-O-R-T-Swt Vern) Other (See Comments)    Trees    Pollen Extract    Social History   Social History Narrative   Married. Retired. 2 children.   Bachelors degree.   Exercises routinely.   Drinks caffeine.   Smoke alarm in the home. Wears her seatbelt. Owns firearms.   Feels safe in her relationships.   Past Medical History:  Diagnosis Date   Arthritis    Blood in stool    greater than 4 years since last incidence of blood in stool    Colon polyps    Glaucoma    Hyperlipidemia    Hypertension    Inclusion cyst 2021   face   PNA (pneumonia) 2006   Strain of left wrist 04/16/2019   Past Surgical History:  Procedure Laterality Date   ABSCESS DRAINAGE  2021   face-  inclusion cyst   BREAST BIOPSY Left 07/19/2018   US  guided breast bx with clip placement- benign   EYE SURGERY  1956   HEMORRHOID SURGERY  2001   RADIOACTIVE SEED GUIDED EXCISIONAL BREAST BIOPSY Left 03/15/2019   Procedure: RADIOACTIVE SEED GUIDED EXCISIONAL LEFT  BREAST BIOPSY;  Surgeon: Jacolyn Matar, MD;  Location: [facility];  Service: General;  Laterality: Left;   TONSILLECTOMY  1963   WISDOM TOOTH EXTRACTION     Family History  Problem Relation Age of Onset   Heart attack Father    Heart attack Sister    Hyperlipidemia Sister    Hypertension Sister    Allergies as of 06/23/2023       Reactions   Codeine Other (See Comments)   Irritable , insomnia    Grass Pollen(k-o-r-t-swt Vern) Other (See Comments)   Trees   Pollen Extract         Medication List        Accurate as of Jun 23, 2023  1:36 PM. If you have any questions, ask your nurse or doctor.          Abrysvo 120 MCG/0.5ML injection Generic drug: RSV bivalent  vaccine   albuterol  108 (90 Base) MCG/ACT inhaler Commonly known as: VENTOLIN  HFA Inhale 2 puffs into the lungs every 6 (six) hours as needed for wheezing or shortness of breath.   amoxicillin -clavulanate 875-125 MG tablet Commonly known as: AUGMENTIN  Take 1 tablet by mouth 2 (two) times daily. Started by: Napolean Backbone   atorvastatin  20 MG tablet Commonly known as: LIPITOR Take 1 tablet (20 mg total) by mouth at bedtime.   DAILY PROBIOTIC PO Take by mouth.   FIBER-CAPS PO Take by mouth.   HYDROcodone -acetaminophen  5-325 MG tablet Commonly known as: NORCO/VICODIN Take 1 tablet by mouth every 6 (six) hours as needed for moderate pain (pain score 4-6). Started by: Napolean Backbone   hydrocortisone  25 MG suppository Commonly known as: ANUSOL -HC Place 1 suppository (25 mg total) rectally 2 (two) times daily.   hydrocortisone  cream 1 % Apply 1 Application topically 2 (two) times daily.   latanoprost 0.005 % ophthalmic solution Commonly known as: XALATAN Place 1 drop into both eyes at bedtime.   lisinopril  10 MG tablet Commonly known as: ZESTRIL  TAKE 1 TABLET BY MOUTH DAILY   multivitamin tablet Take 1 tablet by mouth daily.   timolol 0.5 % ophthalmic solution Commonly known as: TIMOPTIC        All past medical history, surgical history, allergies, family history, immunizations andmedications were updated in the EMR today and reviewed under the history and medication portions of their EMR.     ROS Negative, with the exception of above mentioned in HPI   Objective:  BP 110/68   Pulse 84   Temp 97.9 F (36.6 C)   Wt 129 lb 3.2 oz (58.6 kg)   SpO2 96%   BMI 22.18 kg/m  Body mass index is 22.18 kg/m.  Physical Exam Vitals and nursing note reviewed.  Constitutional:      General: She is not in acute distress.    Appearance: Normal appearance. She is normal weight. She is not ill-appearing or toxic-appearing.  HENT:     Head: Normocephalic and atraumatic.   Eyes:     General: No scleral icterus.       Right eye: No discharge.        Left eye: No discharge.     Extraocular Movements: Extraocular movements intact.  Conjunctiva/sclera: Conjunctivae normal.     Pupils: Pupils are equal, round, and reactive to light.  Genitourinary:    Comments: Anorectal: Mild erythema, no fluctuance, no thrombosis, no drainage or bleeding present.  Prior hemorrhoid present at the 6 o'clock position of the anus, about the same in size and presentation.  12 o'clock position as many external hemorrhoids surrounding the entire anal opening that is now significantly swollen, about the size of a golf ball. Moderate tenderness to palpation, no fluctuance   Skin:    Findings: No rash.  Neurological:     Mental Status: She is alert and oriented to person, place, and time. Mental status is at baseline.     Motor: No weakness.     Coordination: Coordination normal.     Gait: Gait normal.  Psychiatric:        Mood and Affect: Mood normal.        Behavior: Behavior normal.        Thought Content: Thought content normal.        Judgment: Judgment normal.      No results found. No results found. No results found for this or any previous visit (from the past 24 hours).  Assessment/Plan: Angela [name] is a 73 y.o. female present for OV for  Hemorrhoid/rectal pain No signs of thrombosis today.  The hemorrhoids had did not appear inflamed last visit are now significantly inflamed, while the initial hemorrhoid may be a little smaller, but about the same size. Continue warm water/Epsom salt soaks or sitz bath 1-2 times a day. Proctocort  suppository 1-2 times a day and Proctocort  cream twice daily prescribed-again Emergent precautions discussed with patient today. Augmentin  twice daily prescribed, concern there may be an infection process starting now.  She has an appointment in 3 days with her GI team. Encouraged to purchase a donut for sitting on .  Reviewed  expectations re: course of current medical issues. Discussed self-management of symptoms. Outlined signs and symptoms indicating need for more acute intervention. Patient verbalized understanding and all questions were answered. Patient received an After-Visit Summary.    No orders of the defined types were placed in this encounter.  Meds ordered this encounter  Medications   amoxicillin -clavulanate (AUGMENTIN ) 875-125 MG tablet    Sig: Take 1 tablet by mouth 2 (two) times daily.    Dispense:  20 tablet    Refill:  0   hydrocortisone  (ANUSOL -HC) 25 MG suppository    Sig: Place 1 suppository (25 mg total) rectally 2 (two) times daily.    Dispense:  12 suppository    Refill:  0    Generic/formulary substitution- cheapest for her please   HYDROcodone -acetaminophen  (NORCO/VICODIN) 5-325 MG tablet    Sig: Take 1 tablet by mouth every 6 (six) hours as needed for moderate pain (pain score 4-6).    Dispense:  20 tablet    Refill:  0   Referral Orders  No referral(s) requested today     Note is dictated utilizing voice recognition software. Although note has been proof read prior to signing, occasional typographical errors still can be missed. If any questions arise, please do not hesitate to call for verification.   electronically signed by:  Napolean Backbone, DO   [facility] [place]

## 2023-06-26 DIAGNOSIS — K649 Unspecified hemorrhoids: Secondary | ICD-10-CM | POA: Diagnosis not present

## 2023-06-26 DIAGNOSIS — K5900 Constipation, unspecified: Secondary | ICD-10-CM | POA: Diagnosis not present

## 2023-07-09 DIAGNOSIS — K631 Perforation of intestine (nontraumatic): Secondary | ICD-10-CM

## 2023-07-09 DIAGNOSIS — A419 Sepsis, unspecified organism: Secondary | ICD-10-CM

## 2023-07-09 DIAGNOSIS — N1330 Unspecified hydronephrosis: Secondary | ICD-10-CM

## 2023-07-09 HISTORY — DX: Unspecified hydronephrosis: N13.30

## 2023-07-09 HISTORY — DX: Sepsis, unspecified organism: A41.9

## 2023-07-09 HISTORY — DX: Perforation of intestine (nontraumatic): K63.1

## 2023-07-25 DIAGNOSIS — K649 Unspecified hemorrhoids: Secondary | ICD-10-CM | POA: Diagnosis not present

## 2023-07-25 DIAGNOSIS — K5900 Constipation, unspecified: Secondary | ICD-10-CM | POA: Diagnosis not present

## 2023-07-29 ENCOUNTER — Emergency Department (HOSPITAL_BASED_OUTPATIENT_CLINIC_OR_DEPARTMENT_OTHER)

## 2023-07-29 ENCOUNTER — Encounter (HOSPITAL_BASED_OUTPATIENT_CLINIC_OR_DEPARTMENT_OTHER): Payer: Self-pay

## 2023-07-29 ENCOUNTER — Other Ambulatory Visit (HOSPITAL_BASED_OUTPATIENT_CLINIC_OR_DEPARTMENT_OTHER): Payer: Self-pay

## 2023-07-29 ENCOUNTER — Other Ambulatory Visit: Payer: Self-pay

## 2023-07-29 ENCOUNTER — Emergency Department (HOSPITAL_BASED_OUTPATIENT_CLINIC_OR_DEPARTMENT_OTHER)
Admission: EM | Admit: 2023-07-29 | Discharge: 2023-07-29 | Disposition: A | Discharge status: Home / Self Care | Attending: Emergency Medicine | Admitting: Emergency Medicine

## 2023-07-29 DIAGNOSIS — B964 Proteus (mirabilis) (morganii) as the cause of diseases classified elsewhere: Secondary | ICD-10-CM | POA: Diagnosis present

## 2023-07-29 DIAGNOSIS — N132 Hydronephrosis with renal and ureteral calculous obstruction: Secondary | ICD-10-CM | POA: Insufficient documentation

## 2023-07-29 DIAGNOSIS — K5732 Diverticulitis of large intestine without perforation or abscess without bleeding: Secondary | ICD-10-CM | POA: Diagnosis not present

## 2023-07-29 DIAGNOSIS — R140 Abdominal distension (gaseous): Secondary | ICD-10-CM | POA: Diagnosis not present

## 2023-07-29 DIAGNOSIS — Z87891 Personal history of nicotine dependence: Secondary | ICD-10-CM | POA: Insufficient documentation

## 2023-07-29 DIAGNOSIS — K4090 Unilateral inguinal hernia, without obstruction or gangrene, not specified as recurrent: Secondary | ICD-10-CM | POA: Diagnosis not present

## 2023-07-29 DIAGNOSIS — E8809 Other disorders of plasma-protein metabolism, not elsewhere classified: Secondary | ICD-10-CM | POA: Diagnosis present

## 2023-07-29 DIAGNOSIS — E872 Acidosis, unspecified: Secondary | ICD-10-CM | POA: Diagnosis present

## 2023-07-29 DIAGNOSIS — K219 Gastro-esophageal reflux disease without esophagitis: Secondary | ICD-10-CM | POA: Diagnosis present

## 2023-07-29 DIAGNOSIS — K6389 Other specified diseases of intestine: Secondary | ICD-10-CM | POA: Diagnosis not present

## 2023-07-29 DIAGNOSIS — K631 Perforation of intestine (nontraumatic): Secondary | ICD-10-CM | POA: Diagnosis present

## 2023-07-29 DIAGNOSIS — I959 Hypotension, unspecified: Secondary | ICD-10-CM | POA: Diagnosis not present

## 2023-07-29 DIAGNOSIS — N201 Calculus of ureter: Secondary | ICD-10-CM

## 2023-07-29 DIAGNOSIS — N136 Pyonephrosis: Secondary | ICD-10-CM | POA: Diagnosis present

## 2023-07-29 DIAGNOSIS — R0989 Other specified symptoms and signs involving the circulatory and respiratory systems: Secondary | ICD-10-CM | POA: Diagnosis not present

## 2023-07-29 DIAGNOSIS — R935 Abnormal findings on diagnostic imaging of other abdominal regions, including retroperitoneum: Secondary | ICD-10-CM | POA: Diagnosis not present

## 2023-07-29 DIAGNOSIS — K668 Other specified disorders of peritoneum: Secondary | ICD-10-CM | POA: Diagnosis not present

## 2023-07-29 DIAGNOSIS — N1330 Unspecified hydronephrosis: Secondary | ICD-10-CM | POA: Diagnosis not present

## 2023-07-29 DIAGNOSIS — K5904 Chronic idiopathic constipation: Secondary | ICD-10-CM | POA: Diagnosis not present

## 2023-07-29 DIAGNOSIS — A419 Sepsis, unspecified organism: Secondary | ICD-10-CM | POA: Diagnosis present

## 2023-07-29 DIAGNOSIS — E876 Hypokalemia: Secondary | ICD-10-CM | POA: Diagnosis present

## 2023-07-29 DIAGNOSIS — K567 Ileus, unspecified: Secondary | ICD-10-CM | POA: Diagnosis not present

## 2023-07-29 DIAGNOSIS — R1032 Left lower quadrant pain: Secondary | ICD-10-CM | POA: Diagnosis not present

## 2023-07-29 DIAGNOSIS — R1030 Lower abdominal pain, unspecified: Secondary | ICD-10-CM

## 2023-07-29 DIAGNOSIS — R319 Hematuria, unspecified: Secondary | ICD-10-CM | POA: Diagnosis not present

## 2023-07-29 DIAGNOSIS — K7689 Other specified diseases of liver: Secondary | ICD-10-CM | POA: Diagnosis not present

## 2023-07-29 DIAGNOSIS — Z466 Encounter for fitting and adjustment of urinary device: Secondary | ICD-10-CM | POA: Diagnosis not present

## 2023-07-29 DIAGNOSIS — R6883 Chills (without fever): Secondary | ICD-10-CM | POA: Diagnosis not present

## 2023-07-29 DIAGNOSIS — R1084 Generalized abdominal pain: Secondary | ICD-10-CM | POA: Diagnosis not present

## 2023-07-29 DIAGNOSIS — Z0389 Encounter for observation for other suspected diseases and conditions ruled out: Secondary | ICD-10-CM | POA: Diagnosis not present

## 2023-07-29 DIAGNOSIS — I10 Essential (primary) hypertension: Secondary | ICD-10-CM | POA: Diagnosis present

## 2023-07-29 DIAGNOSIS — E785 Hyperlipidemia, unspecified: Secondary | ICD-10-CM | POA: Diagnosis present

## 2023-07-29 DIAGNOSIS — K56609 Unspecified intestinal obstruction, unspecified as to partial versus complete obstruction: Secondary | ICD-10-CM | POA: Diagnosis not present

## 2023-07-29 DIAGNOSIS — K56600 Partial intestinal obstruction, unspecified as to cause: Secondary | ICD-10-CM | POA: Diagnosis not present

## 2023-07-29 DIAGNOSIS — Z933 Colostomy status: Secondary | ICD-10-CM | POA: Diagnosis not present

## 2023-07-29 DIAGNOSIS — F05 Delirium due to known physiological condition: Secondary | ICD-10-CM | POA: Diagnosis not present

## 2023-07-29 DIAGNOSIS — K9189 Other postprocedural complications and disorders of digestive system: Secondary | ICD-10-CM | POA: Diagnosis not present

## 2023-07-29 DIAGNOSIS — K651 Peritoneal abscess: Secondary | ICD-10-CM | POA: Diagnosis not present

## 2023-07-29 DIAGNOSIS — R188 Other ascites: Secondary | ICD-10-CM | POA: Diagnosis not present

## 2023-07-29 DIAGNOSIS — R110 Nausea: Secondary | ICD-10-CM | POA: Diagnosis not present

## 2023-07-29 DIAGNOSIS — K659 Peritonitis, unspecified: Secondary | ICD-10-CM | POA: Diagnosis present

## 2023-07-29 DIAGNOSIS — N134 Hydroureter: Secondary | ICD-10-CM | POA: Diagnosis not present

## 2023-07-29 DIAGNOSIS — H409 Unspecified glaucoma: Secondary | ICD-10-CM | POA: Diagnosis present

## 2023-07-29 DIAGNOSIS — I700 Atherosclerosis of aorta: Secondary | ICD-10-CM | POA: Diagnosis not present

## 2023-07-29 DIAGNOSIS — E871 Hypo-osmolality and hyponatremia: Secondary | ICD-10-CM | POA: Diagnosis present

## 2023-07-29 DIAGNOSIS — Z4682 Encounter for fitting and adjustment of non-vascular catheter: Secondary | ICD-10-CM | POA: Diagnosis not present

## 2023-07-29 DIAGNOSIS — G4700 Insomnia, unspecified: Secondary | ICD-10-CM | POA: Diagnosis present

## 2023-07-29 DIAGNOSIS — E43 Unspecified severe protein-calorie malnutrition: Secondary | ICD-10-CM | POA: Diagnosis present

## 2023-07-29 DIAGNOSIS — K5720 Diverticulitis of large intestine with perforation and abscess without bleeding: Secondary | ICD-10-CM | POA: Diagnosis not present

## 2023-07-29 DIAGNOSIS — R109 Unspecified abdominal pain: Secondary | ICD-10-CM | POA: Diagnosis present

## 2023-07-29 DIAGNOSIS — Z743 Need for continuous supervision: Secondary | ICD-10-CM | POA: Diagnosis not present

## 2023-07-29 DIAGNOSIS — R112 Nausea with vomiting, unspecified: Secondary | ICD-10-CM | POA: Diagnosis not present

## 2023-07-29 DIAGNOSIS — Y838 Other surgical procedures as the cause of abnormal reaction of the patient, or of later complication, without mention of misadventure at the time of the procedure: Secondary | ICD-10-CM | POA: Diagnosis not present

## 2023-07-29 DIAGNOSIS — T8143XA Infection following a procedure, organ and space surgical site, initial encounter: Secondary | ICD-10-CM | POA: Diagnosis not present

## 2023-07-29 LAB — COMPREHENSIVE METABOLIC PANEL WITH GFR
ALT: 12 U/L (ref 0–44)
AST: 23 U/L (ref 15–41)
Albumin: 3.5 g/dL (ref 3.5–5.0)
Alkaline Phosphatase: 105 U/L (ref 38–126)
Anion gap: 15 (ref 5–15)
BUN: 21 mg/dL (ref 8–23)
CO2: 23 mmol/L (ref 22–32)
Calcium: 9.3 mg/dL (ref 8.9–10.3)
Chloride: 95 mmol/L — ABNORMAL LOW (ref 98–111)
Creatinine, Ser: 0.77 mg/dL (ref 0.44–1.00)
GFR, Estimated: >60 mL/min (ref >60)
Glucose, Bld: 129 mg/dL — ABNORMAL HIGH (ref 70–99)
Potassium: 3.6 mmol/L (ref 3.5–5.1)
Sodium: 133 mmol/L — ABNORMAL LOW (ref 135–145)
Total Bilirubin: 0.5 mg/dL (ref 0.0–1.2)
Total Protein: 7 g/dL (ref 6.5–8.1)

## 2023-07-29 LAB — URINALYSIS, W/ REFLEX TO CULTURE (INFECTION SUSPECTED)
Bacteria, UA: NONE SEEN
Bilirubin Urine: NEGATIVE
Glucose, UA: NEGATIVE mg/dL
Ketones, ur: 15 mg/dL — AB
Leukocytes,Ua: NEGATIVE
Nitrite: NEGATIVE
Protein, ur: 30 mg/dL — AB
Specific Gravity, Urine: >1.046 — ABNORMAL HIGH (ref 1.005–1.030)
pH: 7 (ref 5.0–8.0)

## 2023-07-29 LAB — CBC WITH DIFFERENTIAL/PLATELET
Abs Immature Granulocytes: 0.08 10*3/uL — ABNORMAL HIGH (ref 0.00–0.07)
Basophils Absolute: 0.1 10*3/uL (ref 0.0–0.1)
Basophils Relative: 0 %
Eosinophils Absolute: 0 10*3/uL (ref 0.0–0.5)
Eosinophils Relative: 0 %
HCT: 34.5 % — ABNORMAL LOW (ref 36.0–46.0)
Hemoglobin: 11.6 g/dL — ABNORMAL LOW (ref 12.0–15.0)
Immature Granulocytes: 1 %
Lymphocytes Relative: 8 %
Lymphs Abs: 1 10*3/uL (ref 0.7–4.0)
MCH: 28.5 pg (ref 26.0–34.0)
MCHC: 33.6 g/dL (ref 30.0–36.0)
MCV: 84.8 fL (ref 80.0–100.0)
Monocytes Absolute: 0.6 10*3/uL (ref 0.1–1.0)
Monocytes Relative: 5 %
Neutro Abs: 10.8 10*3/uL — ABNORMAL HIGH (ref 1.7–7.7)
Neutrophils Relative %: 86 %
Platelets: 408 10*3/uL — ABNORMAL HIGH (ref 150–400)
RBC: 4.07 MIL/uL (ref 3.87–5.11)
RDW: 12.9 % (ref 11.5–15.5)
WBC: 12.6 10*3/uL — ABNORMAL HIGH (ref 4.0–10.5)
nRBC: 0 % (ref 0.0–0.2)

## 2023-07-29 LAB — LIPASE, BLOOD: Lipase: <10 U/L — ABNORMAL LOW (ref 11–51)

## 2023-07-29 LAB — RESP PANEL BY RT-PCR (RSV, FLU A&B, COVID)  RVPGX2
Influenza A by PCR: NEGATIVE
Influenza B by PCR: NEGATIVE
Resp Syncytial Virus by PCR: NEGATIVE
SARS Coronavirus 2 by RT PCR: NEGATIVE

## 2023-07-29 LAB — PROTIME-INR
INR: 1.1 (ref 0.8–1.2)
Prothrombin Time: 14 s (ref 11.4–15.2)

## 2023-07-29 LAB — LACTIC ACID, PLASMA: Lactic Acid, Venous: 1 mmol/L (ref 0.5–1.9)

## 2023-07-29 MED ORDER — HYDROCODONE-ACETAMINOPHEN 5-325 MG PO TABS
1.0000 | ORAL_TABLET | Freq: Four times a day (QID) | ORAL | 0 refills | Status: DC | PRN
  Filled 2023-07-29: qty 14, 4d supply, fill #0

## 2023-07-29 MED ORDER — HYDROMORPHONE HCL 1 MG/ML IJ SOLN
1.0000 mg | Freq: Once | INTRAMUSCULAR | Status: AC
  Administered 2023-07-29: 1 mg via INTRAVENOUS
  Filled 2023-07-29: qty 1

## 2023-07-29 MED ORDER — ONDANSETRON HCL 4 MG/2ML IJ SOLN
4.0000 mg | Freq: Once | INTRAMUSCULAR | Status: AC
  Administered 2023-07-29: 4 mg via INTRAVENOUS
  Filled 2023-07-29: qty 2

## 2023-07-29 MED ORDER — IOHEXOL 300 MG/ML  SOLN: 100.0000 mL | Freq: Once | INTRAMUSCULAR | Status: DC | PRN

## 2023-07-29 MED ORDER — ONDANSETRON 4 MG PO TBDP
4.0000 mg | ORAL_TABLET | Freq: Three times a day (TID) | ORAL | 0 refills | Status: DC | PRN
  Filled 2023-07-29: qty 20, 7d supply, fill #0

## 2023-07-29 MED ORDER — LACTATED RINGERS IV BOLUS (SEPSIS)
500.0000 mL | Freq: Once | INTRAVENOUS | Status: AC
  Administered 2023-07-29: 500 mL via INTRAVENOUS

## 2023-07-29 MED ORDER — CEFEPIME HCL 2 G IV SOLR
2.0000 g | Freq: Once | INTRAVENOUS | Status: AC
  Administered 2023-07-29: 2 g via INTRAVENOUS
  Filled 2023-07-29: qty 12.5

## 2023-07-29 MED ORDER — IOHEXOL 300 MG/ML  SOLN
100.0000 mL | Freq: Once | INTRAMUSCULAR | Status: AC | PRN
Start: 2023-07-29 — End: 2023-07-29
  Administered 2023-07-29: 100 mL via INTRAVENOUS

## 2023-07-29 MED ORDER — LACTATED RINGERS IV BOLUS (SEPSIS)
1000.0000 mL | Freq: Once | INTRAVENOUS | Status: AC
  Administered 2023-07-29: 1000 mL via INTRAVENOUS

## 2023-07-29 MED ORDER — METRONIDAZOLE 500 MG/100ML IV SOLN
500.0000 mg | Freq: Once | INTRAVENOUS | Status: AC
  Administered 2023-07-29: 500 mg via INTRAVENOUS
  Filled 2023-07-29: qty 100

## 2023-07-29 MED ORDER — LACTATED RINGERS IV SOLN: INTRAVENOUS | Status: DC

## 2023-07-29 MED ORDER — AMOXICILLIN-POT CLAVULANATE 875-125 MG PO TABS
1.0000 | ORAL_TABLET | Freq: Two times a day (BID) | ORAL | 0 refills | Status: DC
Start: 2023-07-29 — End: 2023-09-04
  Filled 2023-07-29: qty 14, 7d supply, fill #0

## 2023-07-29 MED ORDER — FENTANYL CITRATE PF 50 MCG/ML IJ SOSY
25.0000 ug | PREFILLED_SYRINGE | Freq: Once | INTRAMUSCULAR | Status: AC
  Administered 2023-07-29: 25 ug via INTRAVENOUS
  Filled 2023-07-29: qty 1

## 2023-07-29 MED ORDER — LACTATED RINGERS IV BOLUS (SEPSIS)
250.0000 mL | Freq: Once | INTRAVENOUS | Status: AC
  Administered 2023-07-29: 250 mL via INTRAVENOUS

## 2023-07-29 MED ORDER — FENTANYL CITRATE PF 50 MCG/ML IJ SOSY
12.5000 ug | PREFILLED_SYRINGE | Freq: Once | INTRAMUSCULAR | Status: AC
  Administered 2023-07-29: 12.5 ug via INTRAVENOUS
  Filled 2023-07-29: qty 1

## 2023-07-29 NOTE — ED Notes (Signed)
Patient ambulated in hall at this time.

## 2023-07-29 NOTE — Discharge Instructions (Signed)
 For the left-sided kidney stone take the hydrocodone  and Zofran  as needed.  Call urology on Monday for follow-up.  For the colitis take the Augmentin  as directed.  And follow-up with your primary care doctor.  Return for any new or worse symptoms.

## 2023-07-29 NOTE — ED Provider Notes (Addendum)
 [place] EMERGENCY DEPARTMENT AT [facility] Provider Note   CSN: [id number] Arrival date & time: 07/29/23  9247     Patient presents with: Abdominal Pain   Angela [name] is a 73 y.o. female.   Patient with a complaint of lower abdominal pain actually for's more than 6 weeks.  According to family could be months.  Patient recently has had a feeling of fever chills nausea bloating having hard bowel movements.  Recently being followed by [facility] primary care for painful external hemorrhoid that is improved.  Patient is felt lightheaded and a little bit dizzy.  Temp here 98.1 pulse 84 respiration 16 blood pressure 86/56.  Family had 2 previous ones that had systolic at 74.  Oxygen saturation is 98%.  Past medical history significant for hypertension but she did not take any of her blood pressure medicines this morning.  Hyperlipidemia.  Patient had hemorrhoid surgery in 2001.  Left breast biopsy in 2020.  Former smoker of cigarettes.  Patient states she has vomited twice.  No blood in the vomit.       Prior to Admission medications   Medication Sig Start Date End Date Taking? Authorizing Provider  amoxicillin -clavulanate (AUGMENTIN ) 875-125 MG tablet Take 1 tablet by mouth every 12 (twelve) hours. 07/29/23  Yes Baley Shands, MD  HYDROcodone -acetaminophen  (NORCO/VICODIN) 5-325 MG tablet Take 1 tablet by mouth every 6 (six) hours as needed for moderate pain (pain score 4-6). 07/29/23  Yes Trinka Keshishyan, MD  ondansetron  (ZOFRAN -ODT) 4 MG disintegrating tablet Take 1 tablet (4 mg total) by mouth every 8 (eight) hours as needed for nausea or vomiting. 07/29/23  Yes Deunta Beneke, MD  ABRYSVO 120 MCG/0.5ML injection  12/08/21   [provider]  albuterol  (VENTOLIN  HFA) 108 (90 Base) MCG/ACT inhaler Inhale 2 puffs into the lungs every 6 (six) hours as needed for wheezing or shortness of breath. 03/03/22   Kuneff, Renee A, DO  amoxicillin -clavulanate (AUGMENTIN ) 875-125 MG  tablet Take 1 tablet by mouth 2 (two) times daily. 06/23/23   Kuneff, Renee A, DO  atorvastatin  (LIPITOR) 20 MG tablet Take 1 tablet (20 mg total) by mouth at bedtime. Patient not taking: Reported on 04/19/2023 11/30/21   Catherine Fuller A, DO  Calcium  Polycarbophil (FIBER-CAPS PO) Take by mouth.    [provider]  HYDROcodone -acetaminophen  (NORCO/VICODIN) 5-325 MG tablet Take 1 tablet by mouth every 6 (six) hours as needed for moderate pain (pain score 4-6). 06/23/23   Kuneff, Renee A, DO  hydrocortisone  (ANUSOL -HC) 25 MG suppository Place 1 suppository (25 mg total) rectally 2 (two) times daily. 06/23/23   Kuneff, Renee A, DO  hydrocortisone  cream 1 % Apply 1 Application topically 2 (two) times daily. 06/08/23   Kuneff, Renee A, DO  latanoprost (XALATAN) 0.005 % ophthalmic solution Place 1 drop into both eyes at bedtime. 08/11/17   [provider]  lisinopril  (ZESTRIL ) 10 MG tablet TAKE 1 TABLET BY MOUTH DAILY 05/22/23   Kuneff, Renee A, DO  Multiple Vitamin (MULTIVITAMIN) tablet Take 1 tablet by mouth daily.    [provider]  Probiotic Product (DAILY PROBIOTIC PO) Take by mouth.    [provider]  timolol (TIMOPTIC) 0.5 % ophthalmic solution  11/14/19   [provider]    Allergies: Codeine, Grass pollen([id number] vern), and Pollen extract    Review of Systems  Constitutional:  Positive for chills and fever.  HENT:  Negative for ear pain and sore throat.   Eyes:  Negative for pain and  visual disturbance.  Respiratory:  Negative for cough and shortness of breath.   Cardiovascular:  Negative for chest pain and palpitations.  Gastrointestinal:  Positive for abdominal pain, nausea and vomiting.  Genitourinary:  Negative for dysuria and hematuria.  Musculoskeletal:  Negative for arthralgias and back pain.  Skin:  Negative for color change and rash.  Neurological:  Negative for seizures and syncope.  All other systems reviewed and are  negative.   Updated Vital Signs BP 115/74   Pulse 82   Temp 98.1 F (36.7 C) (Oral)   Resp (!) 23   Ht 1.626 m (5' 4)   Wt 57.2 kg   SpO2 93%   BMI 21.63 kg/m   Physical Exam Vitals and nursing note reviewed.  Constitutional:      General: She is not in acute distress.    Appearance: Normal appearance. She is well-developed. She is ill-appearing.  HENT:     Head: Normocephalic and atraumatic.   Eyes:     Conjunctiva/sclera: Conjunctivae normal.    Cardiovascular:     Rate and Rhythm: Normal rate and regular rhythm.     Heart sounds: No murmur heard. Pulmonary:     Effort: Pulmonary effort is normal. No respiratory distress.     Breath sounds: Normal breath sounds.  Abdominal:     General: There is distension.     Palpations: Abdomen is soft.     Tenderness: There is abdominal tenderness.   Musculoskeletal:        General: No swelling.     Cervical back: Neck supple.   Skin:    General: Skin is warm and dry.     Capillary Refill: Capillary refill takes less than 2 seconds.     Coloration: Skin is pale.   Neurological:     General: No focal deficit present.     Mental Status: She is alert and oriented to person, place, and time.   Psychiatric:        Mood and Affect: Mood normal.     (all labs ordered are listed, but only abnormal results are displayed) Labs Reviewed  LIPASE, BLOOD - Abnormal; Notable for the following components:      Result Value   Lipase <10 (*)    All other components within normal limits  COMPREHENSIVE METABOLIC PANEL WITH GFR - Abnormal; Notable for the following components:   Sodium 133 (*)    Chloride 95 (*)    Glucose, Bld 129 (*)    All other components within normal limits  CBC WITH DIFFERENTIAL/PLATELET - Abnormal; Notable for the following components:   WBC 12.6 (*)    Hemoglobin 11.6 (*)    HCT 34.5 (*)    Platelets 408 (*)    Neutro Abs 10.8 (*)    Abs Immature Granulocytes 0.08 (*)    All other components within  normal limits  URINALYSIS, W/ REFLEX TO CULTURE (INFECTION SUSPECTED) - Abnormal; Notable for the following components:   Specific Gravity, Urine >1.046 (*)    Hgb urine dipstick SMALL (*)    Ketones, ur 15 (*)    Protein, ur 30 (*)    All other components within normal limits  RESP PANEL BY RT-PCR (RSV, FLU A&B, COVID)  RVPGX2  CULTURE, BLOOD (ROUTINE X 2)  CULTURE, BLOOD (ROUTINE X 2)  LACTIC ACID, PLASMA  PROTIME-INR    EKG: EKG Interpretation Date/Time:  Saturday July 29 2023 08:58:47 EDT Ventricular Rate:  74 PR Interval:  139 QRS Duration:  88 QT Interval:  430 QTC Calculation: 478 R Axis:   74  Text Interpretation: Sinus rhythm Confirmed by Arman Loy [phone]) on 07/29/2023 9:32:11 AM  Radiology: CT ABDOMEN PELVIS W CONTRAST Result Date: 07/29/2023 CLINICAL DATA:  Six week history of left lower quadrant abdominal pain associated with nausea and chills EXAM: CT ABDOMEN AND PELVIS WITH CONTRAST TECHNIQUE: Multidetector CT imaging of the abdomen and pelvis was performed using the standard protocol following bolus administration of intravenous contrast. RADIATION DOSE REDUCTION: This exam was performed according to the departmental dose-optimization program which includes automated exposure control, adjustment of the mA and/or kV according to patient size and/or use of iterative reconstruction technique. CONTRAST:  OMNIPAQUE  IOHEXOL  300 MG/ML  SOLN COMPARISON:  CT abdomen and pelvis dated 09/17/2020 FINDINGS: Lower chest: No focal consolidation or pulmonary nodule in the lung bases. No pleural effusion or pneumothorax demonstrated. Partially imaged heart size is normal. Hepatobiliary: Unchanged 1.7 cm segment 2 hypodensity (2:14), likely cyst. Additional subcentimeter hypodensity (2:17), too small to characterize but also likely cysts. No intra or extrahepatic biliary ductal dilation. Normal gallbladder. Pancreas: No focal lesions or main ductal dilation. Spleen: Normal in  size without focal abnormality. Adrenals/Urinary Tract: No adrenal nodules. Mild left hydroureteronephrosis to the level of an obstructing 9 mm mid ureteral stone. Bilateral simple/minimally complicated cysts. No focal bladder wall thickening. Stomach/Bowel: Normal appearance of the stomach. Mural thickening of the sigmoid colon, which contains large volume stool. Moderate volume stool throughout the colon. Appendix is not discretely seen. Vascular/Lymphatic: Aortic atherosclerosis. No enlarged abdominal or pelvic lymph nodes. Reproductive: No adnexal masses. Other: Trace pelvic free fluid.  No free air or fluid collection. Musculoskeletal: No acute or abnormal lytic or blastic osseous lesions. Multilevel degenerative changes of the partially imaged thoracic and lumbar spine. Small fat-containing left inguinal hernia. IMPRESSION: 1. Mild left hydroureteronephrosis to the level of an obstructing 9 mm mid ureteral stone. 2. Mural thickening of the sigmoid colon, which contains large volume stool, which may be related to stercoral colitis. Consider correlation with colonoscopy once acute symptoms have resolved to exclude underlying mass lesion in this area if one has not been performed recently. 3.  Aortic Atherosclerosis (ICD10-I70.0). Electronically Signed   By: Limin  Xu M.D.   On: 07/29/2023 09:54   DG Chest Port 1 View Result Date: 07/29/2023 CLINICAL DATA:  Lower abdominal pain, nausea, chills EXAM: PORTABLE CHEST 1 VIEW COMPARISON:  None Available. FINDINGS: Normal lung volumes. No focal consolidations. No pleural effusion or pneumothorax. The heart size and mediastinal contours are within normal limits. No acute osseous abnormality. IMPRESSION: No acute disease. Electronically Signed   By: Limin  Xu M.D.   On: 07/29/2023 08:41     Procedures   Medications Ordered in the ED  lactated ringers  infusion ( Intravenous New Bag/Given 07/29/23 1023)  lactated ringers  bolus 1,000 mL (0 mLs Intravenous Stopped  07/29/23 1018)    And  lactated ringers  bolus 500 mL (0 mLs Intravenous Stopped 07/29/23 1018)    And  lactated ringers  bolus 250 mL (0 mLs Intravenous Stopped 07/29/23 1019)  ceFEPIme  (MAXIPIME ) 2 g in sodium chloride 0.9 % 100 mL IVPB (0 g Intravenous Stopped 07/29/23 0905)  metroNIDAZOLE  (FLAGYL ) IVPB 500 mg (0 mg Intravenous Stopped 07/29/23 1024)  ondansetron  (ZOFRAN ) injection 4 mg (4 mg Intravenous Given 07/29/23 0859)  iohexol  (OMNIPAQUE ) 300 MG/ML solution 100 mL (100 mLs Intravenous Contrast Given 07/29/23 0936)  fentaNYL  (SUBLIMAZE ) injection 12.5 mcg (12.5 mcg Intravenous Given 07/29/23 1015)  fentaNYL  (SUBLIMAZE ) injection 25 mcg (25 mcg Intravenous Given 07/29/23 1147)                                    Medical Decision Making Amount and/or Complexity of Data Reviewed Labs: ordered. Radiology: ordered.  Risk Prescription drug management.   CRITICAL CARE Performed by: Dalisha Shively Total critical care time: 45 minutes Critical care time was exclusive of separately billable procedures and treating other patients. Critical care was necessary to treat or prevent imminent or life-threatening deterioration. Critical care was time spent personally by me on the following activities: development of treatment plan with patient and/or surrogate as well as nursing, discussions with consultants, evaluation of patient's response to treatment, examination of patient, obtaining history from patient or surrogate, ordering and performing treatments and interventions, ordering and review of laboratory studies, ordering and review of radiographic studies, pulse oximetry and re-evaluation of patient's condition.  Patient hypotensive.  History of fever and chills.  Not febrile here not tachycardic here.  But will initiate sepsis protocol.  Patient's abdomen is distended fair amount of tenderness.  Suspect an acute abdominal process probably with infection.  Will initiate sepsis protocol.  Started  on broad-spectrum antibiotics for intra-abdominal infection.  Patient will receive 30 cc/kg fluid due to the hypotension.  There is no concern was for sepsis.  However after just a little bit of fluid patient's blood pressure came up to normal and has remained there.  Patient did have a leukocytosis with a white count of 12.6 hemoglobin 11.6 platelets 408.  Lipase normal complete metabolic panel sodium down a little bit at 133 renal function normal LFTs normal.  Urinalysis did have a fair amount of RBCs in it.  But no signs of infection.  CT scan of the abdomen showed left mid ureteral stone 9 mm in size with hydronephrosis.  It was mural thickening of the sigmoid colon large volume of stool representing maybe some colitis.  I think patient had some dehydration even though renal function was normal.  I think most of her pain which is predominantly left-sided was due to the ureteral stone.  It is large she may not pass it.  But is already mid ureter.  Will treat with antinausea medicine pain medication and have her follow-up with urology for the kidney stone.  And for the colitis we will treat her with Augmentin .  Patient will return for any new or worse symptoms.  Blood cultures are pending.  Patient's vital signs have remained super stable now for several hours here.  I think she is stable enough for discharge home and does not require admission.  Do not think the patient is septic.     Final diagnoses:  Lower abdominal pain  Left ureteral stone    ED Discharge Orders          Ordered    amoxicillin -clavulanate (AUGMENTIN ) 875-125 MG tablet  Every 12 hours        07/29/23 1239    ondansetron  (ZOFRAN -ODT) 4 MG disintegrating tablet  Every 8 hours PRN        07/29/23 1239    HYDROcodone -acetaminophen  (NORCO/VICODIN) 5-325 MG tablet  Every 6 hours PRN        07/29/23 1239               Jamarian Jacinto, MD 07/29/23 9166    Tristine Langi, MD 07/29/23 1242

## 2023-07-29 NOTE — Progress Notes (Signed)
 Elink following for sepsis protocol.

## 2023-07-29 NOTE — ED Notes (Signed)
 Notified Dr. Zackowski of patient's triage and hypotensive BP.

## 2023-07-29 NOTE — ED Notes (Signed)
 Patient transported to CT

## 2023-07-29 NOTE — ED Notes (Signed)
 She remains very uncomfortable and is now having rigors. T 99.5. family are at bedside.,

## 2023-07-29 NOTE — ED Triage Notes (Signed)
 In for eval severe lower center and left abd pain onset 6 weeks ago. Pain worsened yesterday with chills. Nausea, bloating, and gas. Last BM yesterday and hard stool.

## 2023-07-30 ENCOUNTER — Inpatient Hospital Stay (HOSPITAL_BASED_OUTPATIENT_CLINIC_OR_DEPARTMENT_OTHER)
Admission: EM | Admit: 2023-07-30 | Discharge: 2023-09-04 | DRG: 853 | Disposition: A | Discharge status: Home / Self Care | Attending: Family Medicine | Admitting: Family Medicine

## 2023-07-30 ENCOUNTER — Emergency Department (HOSPITAL_BASED_OUTPATIENT_CLINIC_OR_DEPARTMENT_OTHER)

## 2023-07-30 ENCOUNTER — Other Ambulatory Visit: Payer: Self-pay

## 2023-07-30 ENCOUNTER — Encounter (HOSPITAL_COMMUNITY)
Admission: EM | Disposition: A | Discharge status: Home / Self Care | Payer: Self-pay | Source: Home / Self Care | Attending: Internal Medicine

## 2023-07-30 ENCOUNTER — Emergency Department (HOSPITAL_COMMUNITY): Admitting: Anesthesiology

## 2023-07-30 ENCOUNTER — Encounter (HOSPITAL_BASED_OUTPATIENT_CLINIC_OR_DEPARTMENT_OTHER): Payer: Self-pay

## 2023-07-30 DIAGNOSIS — K5904 Chronic idiopathic constipation: Secondary | ICD-10-CM | POA: Diagnosis not present

## 2023-07-30 DIAGNOSIS — K219 Gastro-esophageal reflux disease without esophagitis: Secondary | ICD-10-CM | POA: Diagnosis present

## 2023-07-30 DIAGNOSIS — Z0389 Encounter for observation for other suspected diseases and conditions ruled out: Secondary | ICD-10-CM | POA: Diagnosis not present

## 2023-07-30 DIAGNOSIS — K659 Peritonitis, unspecified: Secondary | ICD-10-CM | POA: Diagnosis present

## 2023-07-30 DIAGNOSIS — K56600 Partial intestinal obstruction, unspecified as to cause: Secondary | ICD-10-CM | POA: Diagnosis not present

## 2023-07-30 DIAGNOSIS — N136 Pyonephrosis: Secondary | ICD-10-CM | POA: Diagnosis present

## 2023-07-30 DIAGNOSIS — R5381 Other malaise: Secondary | ICD-10-CM | POA: Diagnosis present

## 2023-07-30 DIAGNOSIS — I10 Essential (primary) hypertension: Secondary | ICD-10-CM

## 2023-07-30 DIAGNOSIS — K5720 Diverticulitis of large intestine with perforation and abscess without bleeding: Secondary | ICD-10-CM | POA: Diagnosis not present

## 2023-07-30 DIAGNOSIS — G4700 Insomnia, unspecified: Secondary | ICD-10-CM | POA: Diagnosis present

## 2023-07-30 DIAGNOSIS — R140 Abdominal distension (gaseous): Secondary | ICD-10-CM | POA: Diagnosis not present

## 2023-07-30 DIAGNOSIS — Z466 Encounter for fitting and adjustment of urinary device: Secondary | ICD-10-CM | POA: Diagnosis not present

## 2023-07-30 DIAGNOSIS — N1330 Unspecified hydronephrosis: Secondary | ICD-10-CM | POA: Diagnosis present

## 2023-07-30 DIAGNOSIS — Z8701 Personal history of pneumonia (recurrent): Secondary | ICD-10-CM

## 2023-07-30 DIAGNOSIS — M1990 Unspecified osteoarthritis, unspecified site: Secondary | ICD-10-CM | POA: Diagnosis present

## 2023-07-30 DIAGNOSIS — R319 Hematuria, unspecified: Secondary | ICD-10-CM | POA: Diagnosis not present

## 2023-07-30 DIAGNOSIS — E8770 Fluid overload, unspecified: Secondary | ICD-10-CM | POA: Diagnosis not present

## 2023-07-30 DIAGNOSIS — K567 Ileus, unspecified: Secondary | ICD-10-CM | POA: Diagnosis not present

## 2023-07-30 DIAGNOSIS — H409 Unspecified glaucoma: Secondary | ICD-10-CM | POA: Diagnosis present

## 2023-07-30 DIAGNOSIS — E872 Acidosis, unspecified: Secondary | ICD-10-CM | POA: Diagnosis present

## 2023-07-30 DIAGNOSIS — Z87891 Personal history of nicotine dependence: Secondary | ICD-10-CM

## 2023-07-30 DIAGNOSIS — Y838 Other surgical procedures as the cause of abnormal reaction of the patient, or of later complication, without mention of misadventure at the time of the procedure: Secondary | ICD-10-CM | POA: Diagnosis not present

## 2023-07-30 DIAGNOSIS — E876 Hypokalemia: Secondary | ICD-10-CM | POA: Diagnosis present

## 2023-07-30 DIAGNOSIS — E8809 Other disorders of plasma-protein metabolism, not elsewhere classified: Secondary | ICD-10-CM | POA: Diagnosis present

## 2023-07-30 DIAGNOSIS — R188 Other ascites: Secondary | ICD-10-CM | POA: Diagnosis not present

## 2023-07-30 DIAGNOSIS — D6489 Other specified anemias: Secondary | ICD-10-CM | POA: Diagnosis present

## 2023-07-30 DIAGNOSIS — K649 Unspecified hemorrhoids: Secondary | ICD-10-CM | POA: Diagnosis present

## 2023-07-30 DIAGNOSIS — Z860101 Personal history of adenomatous and serrated colon polyps: Secondary | ICD-10-CM

## 2023-07-30 DIAGNOSIS — Z9109 Other allergy status, other than to drugs and biological substances: Secondary | ICD-10-CM

## 2023-07-30 DIAGNOSIS — K6389 Other specified diseases of intestine: Secondary | ICD-10-CM | POA: Diagnosis not present

## 2023-07-30 DIAGNOSIS — R600 Localized edema: Secondary | ICD-10-CM | POA: Diagnosis present

## 2023-07-30 DIAGNOSIS — Z79899 Other long term (current) drug therapy: Secondary | ICD-10-CM

## 2023-07-30 DIAGNOSIS — N201 Calculus of ureter: Secondary | ICD-10-CM

## 2023-07-30 DIAGNOSIS — E43 Unspecified severe protein-calorie malnutrition: Secondary | ICD-10-CM | POA: Diagnosis present

## 2023-07-30 DIAGNOSIS — R1084 Generalized abdominal pain: Secondary | ICD-10-CM | POA: Diagnosis not present

## 2023-07-30 DIAGNOSIS — Z885 Allergy status to narcotic agent status: Secondary | ICD-10-CM

## 2023-07-30 DIAGNOSIS — E785 Hyperlipidemia, unspecified: Secondary | ICD-10-CM | POA: Diagnosis present

## 2023-07-30 DIAGNOSIS — B952 Enterococcus as the cause of diseases classified elsewhere: Secondary | ICD-10-CM | POA: Diagnosis present

## 2023-07-30 DIAGNOSIS — B964 Proteus (mirabilis) (morganii) as the cause of diseases classified elsewhere: Secondary | ICD-10-CM | POA: Diagnosis present

## 2023-07-30 DIAGNOSIS — T8143XA Infection following a procedure, organ and space surgical site, initial encounter: Secondary | ICD-10-CM | POA: Diagnosis not present

## 2023-07-30 DIAGNOSIS — Z4682 Encounter for fitting and adjustment of non-vascular catheter: Secondary | ICD-10-CM | POA: Diagnosis not present

## 2023-07-30 DIAGNOSIS — K651 Peritoneal abscess: Secondary | ICD-10-CM | POA: Diagnosis not present

## 2023-07-30 DIAGNOSIS — R935 Abnormal findings on diagnostic imaging of other abdominal regions, including retroperitoneum: Secondary | ICD-10-CM | POA: Diagnosis not present

## 2023-07-30 DIAGNOSIS — K631 Perforation of intestine (nontraumatic): Secondary | ICD-10-CM | POA: Diagnosis present

## 2023-07-30 DIAGNOSIS — N134 Hydroureter: Secondary | ICD-10-CM | POA: Diagnosis not present

## 2023-07-30 DIAGNOSIS — Z8249 Family history of ischemic heart disease and other diseases of the circulatory system: Secondary | ICD-10-CM

## 2023-07-30 DIAGNOSIS — D75839 Thrombocytosis, unspecified: Secondary | ICD-10-CM | POA: Diagnosis present

## 2023-07-30 DIAGNOSIS — R112 Nausea with vomiting, unspecified: Secondary | ICD-10-CM | POA: Diagnosis not present

## 2023-07-30 DIAGNOSIS — E871 Hypo-osmolality and hyponatremia: Secondary | ICD-10-CM | POA: Diagnosis present

## 2023-07-30 DIAGNOSIS — A419 Sepsis, unspecified organism: Principal | ICD-10-CM | POA: Diagnosis present

## 2023-07-30 DIAGNOSIS — Z83438 Family history of other disorder of lipoprotein metabolism and other lipidemia: Secondary | ICD-10-CM

## 2023-07-30 DIAGNOSIS — R109 Unspecified abdominal pain: Secondary | ICD-10-CM | POA: Diagnosis present

## 2023-07-30 DIAGNOSIS — K668 Other specified disorders of peritoneum: Secondary | ICD-10-CM | POA: Diagnosis not present

## 2023-07-30 DIAGNOSIS — K5289 Other specified noninfective gastroenteritis and colitis: Secondary | ICD-10-CM | POA: Diagnosis present

## 2023-07-30 DIAGNOSIS — K7689 Other specified diseases of liver: Secondary | ICD-10-CM | POA: Diagnosis not present

## 2023-07-30 DIAGNOSIS — I700 Atherosclerosis of aorta: Secondary | ICD-10-CM | POA: Diagnosis not present

## 2023-07-30 DIAGNOSIS — K120 Recurrent oral aphthae: Secondary | ICD-10-CM | POA: Diagnosis present

## 2023-07-30 DIAGNOSIS — K9189 Other postprocedural complications and disorders of digestive system: Secondary | ICD-10-CM | POA: Diagnosis not present

## 2023-07-30 DIAGNOSIS — Z743 Need for continuous supervision: Secondary | ICD-10-CM | POA: Diagnosis not present

## 2023-07-30 DIAGNOSIS — F05 Delirium due to known physiological condition: Secondary | ICD-10-CM | POA: Diagnosis not present

## 2023-07-30 DIAGNOSIS — N132 Hydronephrosis with renal and ureteral calculous obstruction: Secondary | ICD-10-CM | POA: Diagnosis not present

## 2023-07-30 DIAGNOSIS — K56609 Unspecified intestinal obstruction, unspecified as to partial versus complete obstruction: Secondary | ICD-10-CM | POA: Diagnosis not present

## 2023-07-30 DIAGNOSIS — Z2239 Carrier of other specified bacterial diseases: Secondary | ICD-10-CM

## 2023-07-30 DIAGNOSIS — R0989 Other specified symptoms and signs involving the circulatory and respiratory systems: Secondary | ICD-10-CM | POA: Diagnosis not present

## 2023-07-30 DIAGNOSIS — I959 Hypotension, unspecified: Secondary | ICD-10-CM | POA: Diagnosis not present

## 2023-07-30 DIAGNOSIS — K121 Other forms of stomatitis: Secondary | ICD-10-CM | POA: Diagnosis present

## 2023-07-30 DIAGNOSIS — Z933 Colostomy status: Secondary | ICD-10-CM | POA: Diagnosis not present

## 2023-07-30 DIAGNOSIS — K5732 Diverticulitis of large intestine without perforation or abscess without bleeding: Secondary | ICD-10-CM | POA: Diagnosis not present

## 2023-07-30 HISTORY — PX: COLON RESECTION SIGMOID: SHX6737

## 2023-07-30 HISTORY — PX: LAPAROTOMY: SHX154

## 2023-07-30 LAB — CBC
HCT: 30.9 % — ABNORMAL LOW (ref 36.0–46.0)
Hemoglobin: 10.3 g/dL — ABNORMAL LOW (ref 12.0–15.0)
MCH: 29 pg (ref 26.0–34.0)
MCHC: 33.3 g/dL (ref 30.0–36.0)
MCV: 87 fL (ref 80.0–100.0)
Platelets: 299 10*3/uL (ref 150–400)
RBC: 3.55 MIL/uL — ABNORMAL LOW (ref 3.87–5.11)
RDW: 13.2 % (ref 11.5–15.5)
WBC: 8.1 10*3/uL (ref 4.0–10.5)
nRBC: 0 % (ref 0.0–0.2)

## 2023-07-30 LAB — URINALYSIS, ROUTINE W REFLEX MICROSCOPIC
Bacteria, UA: NONE SEEN
Glucose, UA: NEGATIVE mg/dL
Ketones, ur: 15 mg/dL — AB
Leukocytes,Ua: NEGATIVE
Nitrite: NEGATIVE
Protein, ur: 30 mg/dL — AB
Specific Gravity, Urine: 1.032 — ABNORMAL HIGH (ref 1.005–1.030)
pH: 6 (ref 5.0–8.0)

## 2023-07-30 LAB — CBC WITH DIFFERENTIAL/PLATELET
Abs Immature Granulocytes: 0.09 10*3/uL — ABNORMAL HIGH (ref 0.00–0.07)
Basophils Absolute: 0 10*3/uL (ref 0.0–0.1)
Basophils Relative: 0 %
Eosinophils Absolute: 0 10*3/uL (ref 0.0–0.5)
Eosinophils Relative: 0 %
HCT: 37.4 % (ref 36.0–46.0)
Hemoglobin: 12.9 g/dL (ref 12.0–15.0)
Immature Granulocytes: 1 %
Lymphocytes Relative: 7 %
Lymphs Abs: 0.6 10*3/uL — ABNORMAL LOW (ref 0.7–4.0)
MCH: 29.3 pg (ref 26.0–34.0)
MCHC: 34.5 g/dL (ref 30.0–36.0)
MCV: 84.8 fL (ref 80.0–100.0)
Monocytes Absolute: 0.2 10*3/uL (ref 0.1–1.0)
Monocytes Relative: 2 %
Neutro Abs: 7.9 10*3/uL — ABNORMAL HIGH (ref 1.7–7.7)
Neutrophils Relative %: 90 %
Platelets: 344 10*3/uL (ref 150–400)
RBC: 4.41 MIL/uL (ref 3.87–5.11)
RDW: 13.2 % (ref 11.5–15.5)
WBC Morphology: INCREASED
WBC: 8.8 10*3/uL (ref 4.0–10.5)
nRBC: 0 % (ref 0.0–0.2)

## 2023-07-30 LAB — LACTIC ACID, PLASMA
Lactic Acid, Venous: 2.4 mmol/L (ref 0.5–1.9)
Lactic Acid, Venous: 1.8 mmol/L (ref 0.5–1.9)

## 2023-07-30 LAB — COMPREHENSIVE METABOLIC PANEL WITH GFR
ALT: 14 U/L (ref 0–44)
AST: 23 U/L (ref 15–41)
Albumin: 2.8 g/dL — ABNORMAL LOW (ref 3.5–5.0)
Alkaline Phosphatase: 60 U/L (ref 38–126)
Anion gap: 17 — ABNORMAL HIGH (ref 5–15)
BUN: 32 mg/dL — ABNORMAL HIGH (ref 8–23)
CO2: 21 mmol/L — ABNORMAL LOW (ref 22–32)
Calcium: 9.5 mg/dL (ref 8.9–10.3)
Chloride: 95 mmol/L — ABNORMAL LOW (ref 98–111)
Creatinine, Ser: 0.85 mg/dL (ref 0.44–1.00)
GFR, Estimated: >60 mL/min (ref >60)
Glucose, Bld: 142 mg/dL — ABNORMAL HIGH (ref 70–99)
Potassium: 4 mmol/L (ref 3.5–5.1)
Sodium: 132 mmol/L — ABNORMAL LOW (ref 135–145)
Total Bilirubin: 0.6 mg/dL (ref 0.0–1.2)
Total Protein: 6.1 g/dL — ABNORMAL LOW (ref 6.5–8.1)

## 2023-07-30 LAB — TYPE AND SCREEN
ABO/RH(D): O POS
Antibody Screen: NEGATIVE

## 2023-07-30 LAB — HIV ANTIBODY (ROUTINE TESTING W REFLEX): HIV Screen 4th Generation wRfx: NONREACTIVE

## 2023-07-30 LAB — CREATININE, SERUM
Creatinine, Ser: 0.79 mg/dL (ref 0.44–1.00)
GFR, Estimated: >60 mL/min (ref >60)

## 2023-07-30 LAB — MRSA NEXT GEN BY PCR, NASAL: MRSA by PCR Next Gen: NOT DETECTED

## 2023-07-30 LAB — LIPASE, BLOOD: Lipase: <10 U/L — ABNORMAL LOW (ref 11–51)

## 2023-07-30 LAB — ABO/RH: ABO/RH(D): O POS

## 2023-07-30 SURGERY — LAPAROTOMY, EXPLORATORY
Anesthesia: General

## 2023-07-30 SURGERY — LAPAROTOMY, EXPLORATORY
Anesthesia: General | Site: Abdomen

## 2023-07-30 MED ORDER — OXYCODONE HCL 5 MG PO TABS
5.0000 mg | ORAL_TABLET | ORAL | Status: DC | PRN
  Administered 2023-07-31: 10 mg via ORAL
  Administered 2023-08-01: 5 mg via ORAL
  Administered 2023-08-02 – 2023-08-05 (×7): 10 mg via ORAL
  Administered 2023-08-05: 5 mg via ORAL
  Administered 2023-08-05 – 2023-08-06 (×2): 10 mg via ORAL
  Administered 2023-08-06 – 2023-08-07 (×3): 5 mg via ORAL
  Administered 2023-08-07: 10 mg via ORAL
  Administered 2023-08-07 – 2023-08-08 (×2): 5 mg via ORAL
  Administered 2023-08-08 – 2023-08-13 (×8): 10 mg via ORAL
  Filled 2023-07-30 (×8): qty 2
  Filled 2023-07-30 (×2): qty 1
  Filled 2023-07-30 (×4): qty 2
  Filled 2023-07-30 (×2): qty 1
  Filled 2023-07-30: qty 2
  Filled 2023-07-30: qty 1
  Filled 2023-07-30 (×3): qty 2
  Filled 2023-07-30: qty 1
  Filled 2023-07-30 (×3): qty 2
  Filled 2023-07-30: qty 1

## 2023-07-30 MED ORDER — 0.9 % SODIUM CHLORIDE (POUR BTL) OPTIME
TOPICAL | Status: DC | PRN
  Administered 2023-07-30 (×5): 1000 mL
  Administered 2023-07-30: 2000 mL

## 2023-07-30 MED ORDER — PIPERACILLIN-TAZOBACTAM 3.375 G IVPB
3.3750 g | Freq: Three times a day (TID) | INTRAVENOUS | Status: AC
  Administered 2023-07-31 – 2023-08-04 (×15): 3.375 g via INTRAVENOUS
  Filled 2023-07-30 (×15): qty 50

## 2023-07-30 MED ORDER — AMISULPRIDE (ANTIEMETIC) 5 MG/2ML IV SOLN: 10.0000 mg | Freq: Once | INTRAVENOUS | Status: DC | PRN

## 2023-07-30 MED ORDER — ALBUMIN HUMAN 5 % IV SOLN: INTRAVENOUS | Status: DC | PRN

## 2023-07-30 MED ORDER — SODIUM CHLORIDE 0.9 % IV SOLN: INTRAVENOUS | Status: DC

## 2023-07-30 MED ORDER — FENTANYL CITRATE (PF) 250 MCG/5ML IJ SOLN
INTRAMUSCULAR | Status: DC | PRN
  Administered 2023-07-30 (×2): 25 ug via INTRAVENOUS
  Administered 2023-07-30: 100 ug via INTRAVENOUS
  Administered 2023-07-30 (×2): 50 ug via INTRAVENOUS

## 2023-07-30 MED ORDER — ORAL CARE MOUTH RINSE: 15.0000 mL | Freq: Once | OROMUCOSAL | Status: AC

## 2023-07-30 MED ORDER — DEXMEDETOMIDINE HCL IN NACL 80 MCG/20ML IV SOLN
INTRAVENOUS | Status: DC | PRN
  Administered 2023-07-30: 8 ug via INTRAVENOUS

## 2023-07-30 MED ORDER — HYDROMORPHONE HCL 1 MG/ML IJ SOLN
INTRAMUSCULAR | Status: DC | PRN
  Administered 2023-07-30 (×2): .25 mg via INTRAVENOUS

## 2023-07-30 MED ORDER — NOREPINEPHRINE 4 MG/250ML-% IV SOLN: 0.0000 ug/min | INTRAVENOUS | Status: DC

## 2023-07-30 MED ORDER — ONDANSETRON HCL 4 MG/2ML IJ SOLN
INTRAMUSCULAR | Status: AC
Start: 2023-07-30 — End: 2023-07-30
  Filled 2023-07-30: qty 2

## 2023-07-30 MED ORDER — ALBUTEROL SULFATE (2.5 MG/3ML) 0.083% IN NEBU: 2.5000 mg | INHALATION_SOLUTION | Freq: Four times a day (QID) | RESPIRATORY_TRACT | Status: DC | PRN

## 2023-07-30 MED ORDER — CHLORHEXIDINE GLUCONATE 0.12 % MT SOLN
15.0000 mL | Freq: Once | OROMUCOSAL | Status: AC
  Administered 2023-07-30: 15 mL via OROMUCOSAL

## 2023-07-30 MED ORDER — PHENYLEPHRINE HCL-NACL 20-0.9 MG/250ML-% IV SOLN
INTRAVENOUS | Status: DC | PRN
  Administered 2023-07-30: 50 ug/min via INTRAVENOUS

## 2023-07-30 MED ORDER — DEXAMETHASONE SODIUM PHOSPHATE 10 MG/ML IJ SOLN
INTRAMUSCULAR | Status: AC
  Filled 2023-07-30: qty 1

## 2023-07-30 MED ORDER — SUGAMMADEX SODIUM 200 MG/2ML IV SOLN
INTRAVENOUS | Status: DC | PRN
  Administered 2023-07-30: 150 mg via INTRAVENOUS

## 2023-07-30 MED ORDER — LACTATED RINGERS IV SOLN: INTRAVENOUS | Status: AC

## 2023-07-30 MED ORDER — LACTATED RINGERS IV SOLN: INTRAVENOUS | Status: DC

## 2023-07-30 MED ORDER — PIPERACILLIN-TAZOBACTAM 3.375 G IVPB 30 MIN: 3.3750 g | Freq: Three times a day (TID) | INTRAVENOUS | Status: DC

## 2023-07-30 MED ORDER — ORAL CARE MOUTH RINSE: 15.0000 mL | OROMUCOSAL | Status: DC | PRN

## 2023-07-30 MED ORDER — METOPROLOL TARTRATE 5 MG/5ML IV SOLN: 5.0000 mg | Freq: Four times a day (QID) | INTRAVENOUS | Status: DC | PRN

## 2023-07-30 MED ORDER — ONDANSETRON 4 MG PO TBDP: 4.0000 mg | ORAL_TABLET | Freq: Four times a day (QID) | ORAL | Status: DC | PRN

## 2023-07-30 MED ORDER — ACETAMINOPHEN 10 MG/ML IV SOLN
INTRAVENOUS | Status: DC | PRN
  Administered 2023-07-30: 1000 mg via INTRAVENOUS

## 2023-07-30 MED ORDER — SODIUM CHLORIDE 0.9 % IV BOLUS
500.0000 mL | Freq: Once | INTRAVENOUS | Status: AC
  Administered 2023-07-30: 500 mL via INTRAVENOUS

## 2023-07-30 MED ORDER — ROCURONIUM BROMIDE 10 MG/ML (PF) SYRINGE
PREFILLED_SYRINGE | INTRAVENOUS | Status: DC | PRN
  Administered 2023-07-30: 40 mg via INTRAVENOUS
  Administered 2023-07-30 (×2): 10 mg via INTRAVENOUS

## 2023-07-30 MED ORDER — DEXMEDETOMIDINE HCL IN NACL 80 MCG/20ML IV SOLN
INTRAVENOUS | Status: AC
Start: 2023-07-30 — End: 2023-07-30
  Filled 2023-07-30: qty 20

## 2023-07-30 MED ORDER — HYDROMORPHONE HCL 1 MG/ML IJ SOLN
INTRAMUSCULAR | Status: AC
  Filled 2023-07-30: qty 0.5

## 2023-07-30 MED ORDER — MELATONIN 3 MG PO TABS
3.0000 mg | ORAL_TABLET | Freq: Every evening | ORAL | Status: DC | PRN
Start: 2023-07-30 — End: 2023-08-12
  Administered 2023-08-08 – 2023-08-10 (×3): 3 mg via ORAL
  Filled 2023-07-30 (×3): qty 1

## 2023-07-30 MED ORDER — CHLORHEXIDINE GLUCONATE 0.12 % MT SOLN
OROMUCOSAL | Status: AC
  Filled 2023-07-30: qty 15

## 2023-07-30 MED ORDER — MIDAZOLAM HCL 2 MG/2ML IJ SOLN
INTRAMUSCULAR | Status: DC | PRN
  Administered 2023-07-30: 1 mg via INTRAVENOUS

## 2023-07-30 MED ORDER — FENTANYL CITRATE (PF) 100 MCG/2ML IJ SOLN
25.0000 ug | INTRAMUSCULAR | Status: DC | PRN
  Administered 2023-07-30: 25 ug via INTRAVENOUS

## 2023-07-30 MED ORDER — CHLORHEXIDINE GLUCONATE CLOTH 2 % EX PADS
6.0000 | MEDICATED_PAD | Freq: Every day | CUTANEOUS | Status: DC
  Administered 2023-07-30 – 2023-09-04 (×35): 6 via TOPICAL

## 2023-07-30 MED ORDER — SUCCINYLCHOLINE CHLORIDE 200 MG/10ML IV SOSY
PREFILLED_SYRINGE | INTRAVENOUS | Status: DC | PRN
  Administered 2023-07-30: 100 mg via INTRAVENOUS

## 2023-07-30 MED ORDER — FENTANYL CITRATE (PF) 100 MCG/2ML IJ SOLN
INTRAMUSCULAR | Status: AC
  Filled 2023-07-30: qty 2

## 2023-07-30 MED ORDER — FENTANYL CITRATE PF 50 MCG/ML IJ SOSY
25.0000 ug | PREFILLED_SYRINGE | INTRAMUSCULAR | Status: DC | PRN
  Administered 2023-07-31 (×2): 25 ug via INTRAVENOUS
  Filled 2023-07-30 (×2): qty 1

## 2023-07-30 MED ORDER — ALBUTEROL SULFATE HFA 108 (90 BASE) MCG/ACT IN AERS: 2.0000 | INHALATION_SPRAY | Freq: Four times a day (QID) | RESPIRATORY_TRACT | Status: DC | PRN

## 2023-07-30 MED ORDER — ONDANSETRON HCL 4 MG/2ML IJ SOLN
4.0000 mg | Freq: Four times a day (QID) | INTRAMUSCULAR | Status: DC | PRN
Start: 2023-07-30 — End: 2023-09-04
  Administered 2023-08-01 – 2023-09-03 (×25): 4 mg via INTRAVENOUS
  Filled 2023-07-30 (×28): qty 2

## 2023-07-30 MED ORDER — SODIUM CHLORIDE 0.9% FLUSH
3.0000 mL | Freq: Two times a day (BID) | INTRAVENOUS | Status: DC
  Administered 2023-07-30 – 2023-08-18 (×39): 3 mL via INTRAVENOUS

## 2023-07-30 MED ORDER — ALBUMIN HUMAN 5 % IV SOLN
12.5000 g | Freq: Once | INTRAVENOUS | Status: AC
  Administered 2023-07-30: 12.5 g via INTRAVENOUS

## 2023-07-30 MED ORDER — ONDANSETRON HCL 4 MG/2ML IJ SOLN
4.0000 mg | Freq: Once | INTRAMUSCULAR | Status: AC
  Administered 2023-07-30: 4 mg via INTRAVENOUS
  Filled 2023-07-30: qty 2

## 2023-07-30 MED ORDER — LATANOPROST 0.005 % OP SOLN
1.0000 [drp] | Freq: Every day | OPHTHALMIC | Status: DC
  Administered 2023-07-30 – 2023-09-02 (×24): 1 [drp] via OPHTHALMIC
  Filled 2023-07-30: qty 2.5

## 2023-07-30 MED ORDER — SODIUM CHLORIDE 0.9 % IV SOLN: 250.0000 mL | INTRAVENOUS | Status: AC

## 2023-07-30 MED ORDER — FENTANYL CITRATE PF 50 MCG/ML IJ SOSY
25.0000 ug | PREFILLED_SYRINGE | Freq: Once | INTRAMUSCULAR | Status: AC
  Administered 2023-07-30: 25 ug via INTRAVENOUS
  Filled 2023-07-30: qty 1

## 2023-07-30 MED ORDER — HYDROMORPHONE HCL 1 MG/ML IJ SOLN
1.0000 mg | INTRAMUSCULAR | Status: DC | PRN
  Administered 2023-07-31: 1 mg via INTRAVENOUS
  Filled 2023-07-30: qty 1

## 2023-07-30 MED ORDER — PROPOFOL 10 MG/ML IV BOLUS
INTRAVENOUS | Status: DC | PRN
  Administered 2023-07-30: 130 mg via INTRAVENOUS

## 2023-07-30 MED ORDER — METHOCARBAMOL 1000 MG/10ML IJ SOLN
500.0000 mg | Freq: Three times a day (TID) | INTRAMUSCULAR | Status: DC | PRN
Start: 2023-07-30 — End: 2023-07-31
  Filled 2023-07-30: qty 10

## 2023-07-30 MED ORDER — PIPERACILLIN-TAZOBACTAM 3.375 G IVPB 30 MIN
3.3750 g | Freq: Once | INTRAVENOUS | Status: AC
  Administered 2023-07-30: 3.375 g via INTRAVENOUS
  Filled 2023-07-30: qty 50

## 2023-07-30 MED ORDER — LIDOCAINE 2% (20 MG/ML) 5 ML SYRINGE
INTRAMUSCULAR | Status: DC | PRN
  Administered 2023-07-30: 60 mg via INTRAVENOUS

## 2023-07-30 MED ORDER — ONDANSETRON HCL 4 MG/2ML IJ SOLN
INTRAMUSCULAR | Status: DC | PRN
  Administered 2023-07-30: 4 mg via INTRAVENOUS

## 2023-07-30 MED ORDER — MIDAZOLAM HCL 2 MG/2ML IJ SOLN
INTRAMUSCULAR | Status: AC
  Filled 2023-07-30: qty 2

## 2023-07-30 MED ORDER — EPHEDRINE SULFATE-NACL 50-0.9 MG/10ML-% IV SOSY
PREFILLED_SYRINGE | INTRAVENOUS | Status: DC | PRN
  Administered 2023-07-30: 5 mg via INTRAVENOUS

## 2023-07-30 MED ORDER — HYDROMORPHONE HCL 1 MG/ML IJ SOLN: 1.0000 mg | Freq: Once | INTRAMUSCULAR | Status: DC

## 2023-07-30 MED ORDER — ALBUMIN HUMAN 5 % IV SOLN
INTRAVENOUS | Status: AC
  Filled 2023-07-30: qty 250

## 2023-07-30 MED ORDER — LACTATED RINGERS IV SOLN: INTRAVENOUS | Status: DC | PRN

## 2023-07-30 MED ORDER — ACETAMINOPHEN 500 MG PO TABS
1000.0000 mg | ORAL_TABLET | Freq: Four times a day (QID) | ORAL | Status: DC
  Administered 2023-07-31 – 2023-08-06 (×23): 1000 mg via ORAL
  Filled 2023-07-30 (×24): qty 2

## 2023-07-30 MED ORDER — ENOXAPARIN SODIUM 40 MG/0.4ML IJ SOSY
40.0000 mg | PREFILLED_SYRINGE | INTRAMUSCULAR | Status: DC
  Administered 2023-07-31 – 2023-08-14 (×14): 40 mg via SUBCUTANEOUS
  Filled 2023-07-30 (×14): qty 0.4

## 2023-07-30 MED ORDER — PHENYLEPHRINE 80 MCG/ML (10ML) SYRINGE FOR IV PUSH (FOR BLOOD PRESSURE SUPPORT)
PREFILLED_SYRINGE | INTRAVENOUS | Status: DC | PRN
  Administered 2023-07-30 (×4): 160 ug via INTRAVENOUS
  Administered 2023-07-30: 30 ug via INTRAVENOUS

## 2023-07-30 MED ORDER — METHOCARBAMOL 500 MG PO TABS: 500.0000 mg | ORAL_TABLET | Freq: Three times a day (TID) | ORAL | Status: DC | PRN

## 2023-07-30 SURGICAL SUPPLY — 45 items
BAG COUNTER SPONGE SURGICOUNT (BAG) ×2 IMPLANT
BNDG GAUZE DERMACEA FLUFF 4 (GAUZE/BANDAGES/DRESSINGS) IMPLANT
CHLORAPREP W/TINT 26 (MISCELLANEOUS) ×2 IMPLANT
COVER SURGICAL LIGHT HANDLE (MISCELLANEOUS) ×2 IMPLANT
DRAPE LAPAROSCOPIC ABDOMINAL (DRAPES) ×2 IMPLANT
DRAPE WARM FLUID 44X44 (DRAPES) ×2 IMPLANT
DRSG OPSITE POSTOP 4X10 (GAUZE/BANDAGES/DRESSINGS) IMPLANT
DRSG OPSITE POSTOP 4X8 (GAUZE/BANDAGES/DRESSINGS) IMPLANT
ELECT BLADE 6.5 EXT (BLADE) IMPLANT
ELECT CAUTERY BLADE 6.4 (BLADE) ×2 IMPLANT
ELECTRODE REM PT RTRN 9FT ADLT (ELECTROSURGICAL) ×2 IMPLANT
GAUZE PAD ABD 8X10 STRL (GAUZE/BANDAGES/DRESSINGS) IMPLANT
GLOVE BIO SURGEON STRL SZ 6.5 (GLOVE) IMPLANT
GLOVE BIO SURGEON STRL SZ7 (GLOVE) ×2 IMPLANT
GLOVE BIOGEL PI IND STRL 7.0 (GLOVE) IMPLANT
GLOVE BIOGEL PI IND STRL 7.5 (GLOVE) ×2 IMPLANT
GOWN STRL REUS W/ TWL LRG LVL3 (GOWN DISPOSABLE) ×4 IMPLANT
GOWN STRL REUS W/ TWL XL LVL3 (GOWN DISPOSABLE) ×2 IMPLANT
GOWN STRL REUS W/TWL 2XL LVL3 (GOWN DISPOSABLE) IMPLANT
HANDLE SUCTION POOLE (INSTRUMENTS) ×2 IMPLANT
KIT BASIN OR (CUSTOM PROCEDURE TRAY) ×2 IMPLANT
KIT OSTOMY DRAINABLE 2.75 STR (WOUND CARE) IMPLANT
KIT TURNOVER KIT B (KITS) ×2 IMPLANT
LIGASURE IMPACT 36 18CM CVD LR (INSTRUMENTS) IMPLANT
LOOP VASCLR MAXI BLUE 18IN ST (MISCELLANEOUS) IMPLANT
LOOPS VASCLR MAXI BLUE 18IN ST (MISCELLANEOUS) ×2 IMPLANT
NS IRRIG 1000ML POUR BTL (IV SOLUTION) ×4 IMPLANT
PACK GENERAL/GYN (CUSTOM PROCEDURE TRAY) ×2 IMPLANT
PAD ARMBOARD POSITIONER FOAM (MISCELLANEOUS) ×2 IMPLANT
SPONGE T-LAP 18X18 ~~LOC~~+RFID (SPONGE) IMPLANT
STAPLER CVD CUT GN 40 RELOAD (ENDOMECHANICALS) ×2 IMPLANT
STAPLER CVD CUT GRN 40 RELOAD (ENDOMECHANICALS) IMPLANT
STAPLER PROXIMATE 75MM BLUE (STAPLE) IMPLANT
STAPLER SKIN PROX 35W (STAPLE) IMPLANT
SUT PDS AB 1 CTX 36 (SUTURE) IMPLANT
SUT PDS AB 1 TP1 96 (SUTURE) ×4 IMPLANT
SUT PROLENE 2 0 SH DA (SUTURE) IMPLANT
SUT SILK 2 0 SH CR/8 (SUTURE) ×2 IMPLANT
SUT SILK 2 0 TIES 10X30 (SUTURE) ×2 IMPLANT
SUT SILK 3 0 SH CR/8 (SUTURE) ×2 IMPLANT
SUT SILK 3 0 TIES 10X30 (SUTURE) ×2 IMPLANT
SUT VIC AB 3-0 SH 18 (SUTURE) IMPLANT
TOWEL GREEN STERILE (TOWEL DISPOSABLE) ×2 IMPLANT
TRAY FOLEY MTR SLVR 16FR STAT (SET/KITS/TRAYS/PACK) IMPLANT
YANKAUER SUCT BULB TIP NO VENT (SUCTIONS) IMPLANT

## 2023-07-30 NOTE — Consult Note (Signed)
 WOC consulted for new ostomy; to OR today 6/22; WOC nursing team will follow up Mon/Tuesday for post op ostomy care and teaching.   Rosalee Tolley [facility] Advocate, CNS, [employer] [phone]

## 2023-07-30 NOTE — ED Notes (Addendum)
 Patient arrived by [facility] from [facility] to be admitted and evaluated by surgeon. Patient here with perforated bowel and left ureter cyst. Alert and oriented. Patient received IV Zosyn prior to arrival.

## 2023-07-30 NOTE — Op Note (Signed)
 07/30/2023  6:09 PM  PATIENT:  Angela [name] March  73 y.o. female  Patient Care Team: Angela [name] LABOR, DO as PCP - General (Family Medicine) Angela Fallow, MD as Consulting Physician (Gastroenterology)  PRE-OPERATIVE DIAGNOSIS:  Pneumoperitoneum  POST-OPERATIVE DIAGNOSIS:  Perforated sigmoid colon  PROCEDURE:  Sigmoidectomy with end colostomy   SURGEON:  Angela RONAL Idler, MD  ASSISTANT: Angela Ned, MD A surgeon assistant was necessary given the complexity of the case and the acuity of the situation. She was essential in helping with dissection, retraction, and exposure throughout the case.   ANESTHESIA:   general  COUNTS:  Sponge, needle and instrument counts were reported correct x2 at the conclusion of the operation.  EBL:  DRAINS: None  SPECIMEN: Sigmoidectomy  COMPLICATIONS: None  FINDINGS: Large stool burden throughout the colon.  Ischemic changes in the sigmoid colon with significant inflammation and a chronic abscess cavity with perforation and large volume feculent peritonitis.  DISPOSITION: PACU in satisfactory condition  INDICATION: Angela [name] is a 73 year old female with a history of chronic constipation. She presented with several days of abdominal pain and had evidence of pneumoperitoneum concerning for perforated viscous. I recommended exploration in the OR. I addressed all of her questions and written consent was obtained.  DESCRIPTION: The patient was identified in preop holding and taken to the OR where she was placed on the operating room table. SCDs were placed. General endotracheal anesthesia was induced without difficulty. The abdomen was then prepped and draped in the usual sterile fashion. A surgical timeout was performed indicating the correct patient, procedure, positioning and need for preoperative antibiotics.   I began with a midline laparotomy using a 10 blade.  This was carried down through subcutaneous tissues using electrocautery.  The linea  alba was identified and scored.  The abdomen was entered sharply.  There was no signs of injury from entry but there was an immediate return of murky ascites.  I extended my excision to allow exposure of the sigmoid colon and rectum.  There were several liters of murky and feculent appearing ascites throughout the abdomen.  I began by examining the small bowel.  I ran the bowel from the TI to the LOT there were several areas of staining from the stool but no evidence of compromise in the bowel appeared normal.  I examined the stomach which also appeared normal.  I then turned my attention to the colon the cecum was slightly dilated and filled with stool.  She had a redundant transverse colon that was also filled with stool.  The proximal descending colon appeared mostly normal but as I moved distal the bowel appeared ischemic and there was ongoing spillage of feculent material.  At this point I realized that the perforation is likely distal in the pelvis and require a difficult dissection.  I therefore made the decision to call my colleague, Dr. Ned, to the OR for assistance.  We began by mobilizing the right colon along the white line.  I then identified a portion of normal-appearing bowel in the mid sigmoid colon.  I made a mesenteric window and then transected the bowel using a firing of the GIA 75 blue load stapler.  We then carried our dissection distal using a combination of blunt dissection and bipolar electrocautery to develop the plane.  The ureter on the left was never identified but we stay close to the bowel to avoid injury.  The ureter on the right was identified and isolated to protected  it throughout the case.  As we moved distal we discovered that the sigmoid was relatively redundant and folded on itself.  There was a significant amount of inflammation and a chronic appearing abscess cavity along the right anterior portion of the distal sigmoid colon along with a perforation and free spillage of  stool.  We were eventually able to straighten out the colon and dissect it free from adhesions to the pelvis and abdominal wall.  A curvilinear green load 60 stapler was used to transect the colon along the proximal rectum.  The sigmoid colon was passed off the field as a specimen.  The staple line appeared viable and was hemostatic.  The staple line was tagged with a 2-0 Prolene.  We then irrigated the abdomen with several liters of saline until the effluent ran clear.  We again examined the staple line which appeared hemostatic.  We then moved onto creation of the end colostomy.  An ellipse of skin was excised along the left abdomen.  This was carried down through subcutaneous tissue using electrocautery, the anterior sheath was identified and scored in a cruciate fashion, the muscle was spread, and the posterior sheath was incised.  The cut end of the sigmoid colon was passed through the newly created trephine.  There was no tension on the bowel.  The fascia was then closed with #1 nonlooped PDS suture.  The skin was left open and packed with Kerlix.  The end colostomy was then matured in standard fashion using 3-0 Vicryl.  An ostomy appliance was applied.

## 2023-07-30 NOTE — ED Provider Notes (Addendum)
 [place] EMERGENCY DEPARTMENT AT [facility] Provider Note   CSN: [id number] Arrival date & time: 07/30/23  9091     Patient presents with: Abdominal Pain, Hematuria, and Shortness of Breath   Angela [name] is a 73 y.o. female.   Patient seen by me yesterday.  Patient came in hypotensive.  There was initial concerns for sepsis.  But patient's lactic acid was very normal.  And with fluids blood pressure came up and stayed up.  We found on CT scan a left renal stone.  About mid ureter.  Patient was offered admission because she she was struggling with some of the pain but she wanted to go home.  CT scan also showed evidence of some colitis.  Patient states now she is unable to void.  And that she is got blood in her urine.  Vital signs on presentation temp 98 pulse 97 respirations 20 blood pressure 107/74 oxygen saturation on room air was 98% but in the room is 94%.  Patient states is also difficult to breathe.  She has abdominal pain all over all the way up to her ribs.  Past medical history hyperlipidemia hypertension.  Former smoker.  As per my note from yesterday.  Patient been having abdominal pain for several weeks to several months.  The acute pain was just in the past few days.  We thought that was related to a left ureteral stone.  CT scan did show evidence of colitis without any complicating factors.  Patient was treated with broad-spectrum antibiotics as part of the sepsis protocol initially.  Then was discharged home on Augmentin .  With follow-up with urology.  With instructions to return for any new or worse symptoms.       Prior to Admission medications   Medication Sig Start Date End Date Taking? Authorizing Provider  ABRYSVO 120 MCG/0.5ML injection  12/08/21   [provider]  albuterol  (VENTOLIN  HFA) 108 (90 Base) MCG/ACT inhaler Inhale 2 puffs into the lungs every 6 (six) hours as needed for wheezing or shortness of breath. 03/03/22   Kuneff, Renee A, DO   amoxicillin -clavulanate (AUGMENTIN ) 875-125 MG tablet Take 1 tablet by mouth 2 (two) times daily. 06/23/23   Kuneff, Renee A, DO  amoxicillin -clavulanate (AUGMENTIN ) 875-125 MG tablet Take 1 tablet by mouth every 12 (twelve) hours. 07/29/23   Lilian Fuhs, MD  atorvastatin  (LIPITOR) 20 MG tablet Take 1 tablet (20 mg total) by mouth at bedtime. Patient not taking: Reported on 04/19/2023 11/30/21   Catherine Fuller A, DO  Calcium  Polycarbophil (FIBER-CAPS PO) Take by mouth.    [provider]  HYDROcodone -acetaminophen  (NORCO/VICODIN) 5-325 MG tablet Take 1 tablet by mouth every 6 (six) hours as needed for moderate pain (pain score 4-6). 06/23/23   Kuneff, Renee A, DO  HYDROcodone -acetaminophen  (NORCO/VICODIN) 5-325 MG tablet Take 1 tablet by mouth every 6 (six) hours as needed for moderate pain (pain score 4-6). 07/29/23   Iokepa Geffre, MD  hydrocortisone  (ANUSOL -HC) 25 MG suppository Place 1 suppository (25 mg total) rectally 2 (two) times daily. 06/23/23   Kuneff, Renee A, DO  hydrocortisone  cream 1 % Apply 1 Application topically 2 (two) times daily. 06/08/23   Kuneff, Renee A, DO  latanoprost (XALATAN) 0.005 % ophthalmic solution Place 1 drop into both eyes at bedtime. 08/11/17   [provider]  lisinopril  (ZESTRIL ) 10 MG tablet TAKE 1 TABLET BY MOUTH DAILY 05/22/23   Kuneff, Renee A, DO  Multiple Vitamin (MULTIVITAMIN) tablet Take 1 tablet by mouth  daily.    [provider]  ondansetron  (ZOFRAN -ODT) 4 MG disintegrating tablet Take 1 tablet (4 mg total) by mouth every 8 (eight) hours as needed for nausea or vomiting. 07/29/23   Sierria Bruney, MD  Probiotic Product (DAILY PROBIOTIC PO) Take by mouth.    [provider]  timolol (TIMOPTIC) 0.5 % ophthalmic solution  11/14/19   [provider]    Allergies: Codeine, Grass pollen([id number]-swt vern), and Pollen extract    Review of Systems  Constitutional:  Negative for chills and fever.  HENT:  Negative  for ear pain and sore throat.   Eyes:  Negative for pain and visual disturbance.  Respiratory:  Positive for shortness of breath. Negative for cough.   Cardiovascular:  Negative for chest pain and palpitations.  Gastrointestinal:  Positive for abdominal pain. Negative for vomiting.  Genitourinary:  Negative for dysuria and hematuria.  Musculoskeletal:  Negative for arthralgias and back pain.  Skin:  Negative for color change and rash.  Neurological:  Negative for seizures and syncope.  All other systems reviewed and are negative.   Updated Vital Signs BP 107/74 (BP Location: Left Arm)   Pulse 97   Temp 98 F (36.7 C) (Oral)   Resp 20   Ht 1.626 m (5' 4)   Wt 57.2 kg   SpO2 100%   BMI 21.65 kg/m   Physical Exam Vitals and nursing note reviewed.  Constitutional:      General: She is not in acute distress.    Appearance: Normal appearance. She is well-developed.  HENT:     Head: Normocephalic and atraumatic.     Mouth/Throat:     Mouth: Mucous membranes are moist.   Eyes:     Conjunctiva/sclera: Conjunctivae normal.    Cardiovascular:     Rate and Rhythm: Normal rate and regular rhythm.     Heart sounds: No murmur heard. Pulmonary:     Effort: Pulmonary effort is normal. No respiratory distress.     Breath sounds: Normal breath sounds.  Abdominal:     General: There is distension.     Palpations: Abdomen is soft.     Tenderness: There is abdominal tenderness.   Musculoskeletal:        General: No swelling.     Cervical back: Normal range of motion and neck supple.     Right lower leg: No edema.     Left lower leg: No edema.   Skin:    General: Skin is warm and dry.     Capillary Refill: Capillary refill takes less than 2 seconds.   Neurological:     General: No focal deficit present.     Mental Status: She is alert and oriented to person, place, and time.   Psychiatric:        Mood and Affect: Mood normal.     (all labs ordered are listed, but only  abnormal results are displayed) Labs Reviewed  CBC WITH DIFFERENTIAL/PLATELET  URINALYSIS, ROUTINE W REFLEX MICROSCOPIC  COMPREHENSIVE METABOLIC PANEL WITH GFR  LIPASE, BLOOD  LACTIC ACID, PLASMA  LACTIC ACID, PLASMA    EKG: None  Radiology: CT ABDOMEN PELVIS W CONTRAST Result Date: 07/29/2023 CLINICAL DATA:  Six week history of left lower quadrant abdominal pain associated with nausea and chills EXAM: CT ABDOMEN AND PELVIS WITH CONTRAST TECHNIQUE: Multidetector CT imaging of the abdomen and pelvis was performed using the standard protocol following bolus administration of intravenous contrast. RADIATION DOSE REDUCTION: This exam was performed according to  the departmental dose-optimization program which includes automated exposure control, adjustment of the mA and/or kV according to patient size and/or use of iterative reconstruction technique. CONTRAST:  OMNIPAQUE  IOHEXOL  300 MG/ML  SOLN COMPARISON:  CT abdomen and pelvis dated 09/17/2020 FINDINGS: Lower chest: No focal consolidation or pulmonary nodule in the lung bases. No pleural effusion or pneumothorax demonstrated. Partially imaged heart size is normal. Hepatobiliary: Unchanged 1.7 cm segment 2 hypodensity (2:14), likely cyst. Additional subcentimeter hypodensity (2:17), too small to characterize but also likely cysts. No intra or extrahepatic biliary ductal dilation. Normal gallbladder. Pancreas: No focal lesions or main ductal dilation. Spleen: Normal in size without focal abnormality. Adrenals/Urinary Tract: No adrenal nodules. Mild left hydroureteronephrosis to the level of an obstructing 9 mm mid ureteral stone. Bilateral simple/minimally complicated cysts. No focal bladder wall thickening. Stomach/Bowel: Normal appearance of the stomach. Mural thickening of the sigmoid colon, which contains large volume stool. Moderate volume stool throughout the colon. Appendix is not discretely seen. Vascular/Lymphatic: Aortic atherosclerosis. No  enlarged abdominal or pelvic lymph nodes. Reproductive: No adnexal masses. Other: Trace pelvic free fluid.  No free air or fluid collection. Musculoskeletal: No acute or abnormal lytic or blastic osseous lesions. Multilevel degenerative changes of the partially imaged thoracic and lumbar spine. Small fat-containing left inguinal hernia. IMPRESSION: 1. Mild left hydroureteronephrosis to the level of an obstructing 9 mm mid ureteral stone. 2. Mural thickening of the sigmoid colon, which contains large volume stool, which may be related to stercoral colitis. Consider correlation with colonoscopy once acute symptoms have resolved to exclude underlying mass lesion in this area if one has not been performed recently. 3.  Aortic Atherosclerosis (ICD10-I70.0). Electronically Signed   By: Limin  Xu M.D.   On: 07/29/2023 09:54   DG Chest Port 1 View Result Date: 07/29/2023 CLINICAL DATA:  Lower abdominal pain, nausea, chills EXAM: PORTABLE CHEST 1 VIEW COMPARISON:  None Available. FINDINGS: Normal lung volumes. No focal consolidations. No pleural effusion or pneumothorax. The heart size and mediastinal contours are within normal limits. No acute osseous abnormality. IMPRESSION: No acute disease. Electronically Signed   By: Limin  Xu M.D.   On: 07/29/2023 08:41     Procedures   Medications Ordered in the ED  0.9 %  sodium chloride infusion (has no administration in time range)  sodium chloride 0.9 % bolus 500 mL (500 mLs Intravenous New Bag/Given 07/30/23 0958)  ondansetron  (ZOFRAN ) injection 4 mg (4 mg Intravenous Given 07/30/23 0958)  fentaNYL  (SUBLIMAZE ) injection 25 mcg (25 mcg Intravenous Given 07/30/23 0957)                                    Medical Decision Making Amount and/or Complexity of Data Reviewed Labs: ordered. Radiology: ordered.  Risk Prescription drug management. Decision regarding hospitalization.   Will give IV fluids.  Will give fentanyl  for pain.  Will get bladder scan.  CT  renal scan was ordered.  CBC white count is normal at 8.8.  Hemoglobin 12.9.  Platelets 344.  Complete metabolic panel pending.  Lipase pending.  Urinalysis pending.  Proceed with CT scan repeat shows.  Also ordered bladder scan.  Clinically seems to maybe bladder is distended.  Or there could be an acute abdominal process.  Initially the concern yesterday was for the 9 mm left ureteral stone.  CT renal will help us  evaluate its location.  Patient was told yesterday for anything new or  worse to come back and would require admission.  CT scan just results just called.  Bladder is not distended.  The left ureteral stone is still mid ureter.  Despite no findings in the abdomen yesterday other than the colitis so there is no evidence of bowel perforation.  Will contact general surgery.  Will start her on Zosyn.  As stated we did offer admission yesterday.  There was not any evidence of an acute abdominal process at that time.  Radiology took back to look at scans from yesterday.  There was no evidence of perforation then.  So unfortunately this would have occurred whether in the hospital or not.  Patient was treated with broad-spectrum antibiotics yesterday and was sent home on Augmentin .  Will add on blood cultures as well.  CRITICAL CARE Performed by: Deedee Lybarger Total critical care time: 45 minutes Critical care time was exclusive of separately billable procedures and treating other patients. Critical care was necessary to treat or prevent imminent or life-threatening deterioration. Critical care was time spent personally by me on the following activities: development of treatment plan with patient and/or surrogate as well as nursing, discussions with consultants, evaluation of patient's response to treatment, examination of patient, obtaining history from patient or surrogate, ordering and performing treatments and interventions, ordering and review of laboratory studies, ordering and review of  radiographic studies, pulse oximetry and re-evaluation of patient's condition.  Discussed with Dr. Polly on-call for general surgery.  Once patient transfer ED ED to come but wants hospitalist to work on admission.  He is thinking that she will probably have to go to the operating room.  Patient's lactic acid elevated today at 2.4 yesterday it was normal.  Patient's blood pressure with fluids now up around 104 systolic.  Will contact for transfer to [facility] ED.  Still awaiting hospitalist to call.    Final diagnoses:  Bowel perforation [facility])  Left ureteral stone    ED Discharge Orders     None          Geraldene Hamilton, MD 07/30/23 1010    Geraldene Hamilton, MD 07/30/23 1012    Geraldene Hamilton, MD 07/30/23 1012    Geraldene Hamilton, MD 07/30/23 1055    Geraldene Hamilton, MD 07/30/23 1110

## 2023-07-30 NOTE — ED Notes (Signed)
 Called [facility] at [facility] for transport to [facility]-Dr. Cottie accepting

## 2023-07-30 NOTE — ED Notes (Signed)
Dr. Hillery Hunter at bedside

## 2023-07-30 NOTE — ED Notes (Signed)
 Attempted report to ED x3 with no answer. Charge nurse sent a direct message of incoming patient.   Patient off the floor with carelink at this time.

## 2023-07-30 NOTE — Anesthesia Procedure Notes (Signed)
 Arterial Line Insertion Start/End6/22/2025 2:55 PM, 07/30/2023 3:00 PM Performed by: Nanci Larraine DASEN, CRNA  Patient location: OR. Preanesthetic checklist: patient identified, IV checked, site marked, risks and benefits discussed, surgical consent, monitors and equipment checked, pre-op evaluation, timeout performed and anesthesia consent Right, radial was placed Hand hygiene performed  and maximum sterile barriers used   Procedure performed without using ultrasound guided technique. Following insertion, dressing applied and Biopatch. Post procedure assessment: normal  Patient tolerated the procedure well with no immediate complications.

## 2023-07-30 NOTE — ED Notes (Signed)
 No bladder scan needed at this time, per Dr. Zackowski

## 2023-07-30 NOTE — Plan of Care (Signed)
 Transfer from [facility] Ms. Younce is a 73 y/o F [facility]h presents with 3 days of abdominal pain initially seen in the ED yesterday.  CT scan revealed a mild left hydroureteronephrosis to the level of a obstructing 9 mm mid ureteral stone and large volume stool with stercoral colitis.  She was given antibiotics with recommendations for admission, but patient declined and ultimately discharged home on Augmentin .  However after getting home patient developed worsening abdominal pain and dysphagia with bloody urine.  Lactic acid was 2.5 with transient hypotension with blood pressures 84/54.SABRA  Repeat CT scan revealed interval intraperitoneal free air and moderate volume free fluid consistent with a bowel perforation and persistent large stool within the dilated sigmoid colon in similar location of distal left ureteral stone measuring 9 mm with interval decreased left hydroureter nephrosis.  Dr. Polly on-call for general surgery recommended transfer ED to ED for likely need of surgery.  Patient had been given 500 mL bolus of IV fluids and started on empiric antibiotics of Zosyn.  Accepted to a progressive bed as inpatient, but if requiring ICU following likely procedure would need to contact PCCM.

## 2023-07-30 NOTE — Anesthesia Postprocedure Evaluation (Signed)
 Anesthesia Post Note  Patient: Angela [name]  Procedure(s) Performed: LAPAROTOMY, EXPLORATORY COLECTOMY, SIGMOID, OPEN (Abdomen)     Patient location during evaluation: PACU Anesthesia Type: General Level of consciousness: awake and alert Pain management: pain level controlled Vital Signs Assessment: post-procedure vital signs reviewed and stable Respiratory status: spontaneous breathing, nonlabored ventilation, respiratory function stable and patient connected to nasal cannula oxygen Cardiovascular status: blood pressure returned to baseline and stable Postop Assessment: no apparent nausea or vomiting Anesthetic complications: no  No notable events documented.  Last Vitals:  Vitals:   07/30/23 1915 07/30/23 1930  BP: 102/64 96/65  Pulse: (!) 112 (!) 110  Resp: 18 18  Temp: 36.7 C 36.7 C  SpO2: 95% 94%    Last Pain:  Vitals:   07/30/23 1930  TempSrc:   PainSc: 0-No pain                 [name]

## 2023-07-30 NOTE — Anesthesia Preprocedure Evaluation (Signed)
 Anesthesia Evaluation  Patient identified by MRN, date of birth, ID band Patient awake    Reviewed: Allergy & Precautions, NPO status , Patient's Chart, lab work & pertinent test results  Airway Mallampati: II  TM Distance: >3 FB Neck ROM: Full    Dental  (+) Dental Advisory Given   Pulmonary Patient abstained from smoking., former smoker   breath sounds clear to auscultation       Cardiovascular hypertension, Pt. on medications  Rhythm:Regular Rate:Normal     Neuro/Psych negative neurological ROS     GI/Hepatic Neg liver ROS,,,Perforated viscus   Endo/Other    Renal/GU Renal disease (9mm left ureteral stone)     Musculoskeletal  (+) Arthritis ,    Abdominal   Peds  Hematology   Anesthesia Other Findings   Reproductive/Obstetrics                             Anesthesia Physical Anesthesia Plan  ASA: 3 and emergent  Anesthesia Plan: General   Post-op Pain Management: Ofirmev  IV (intra-op)*   Induction: Intravenous  PONV Risk Score and Plan: 3 and Dexamethasone , Ondansetron  and Treatment may vary due to age or medical condition  Airway Management Planned: Oral ETT  Additional Equipment:   Intra-op Plan:   Post-operative Plan: Extubation in OR  Informed Consent: I have reviewed the patients History and Physical, chart, labs and discussed the procedure including the risks, benefits and alternatives for the proposed anesthesia with the patient or authorized representative who has indicated his/her understanding and acceptance.     Dental advisory given  Plan Discussed with: CRNA  Anesthesia Plan Comments:        Anesthesia Quick Evaluation

## 2023-07-30 NOTE — ED Triage Notes (Addendum)
 Arrives POV with complaints of abdominal pain and shortness of breath. Patient was seen here yesterday for the same symptoms (diagnosed with a kidney stone) and declined hospital admission. Pain worsened and patient started having blood in her urine overnight. Rates pain an 8/10.

## 2023-07-30 NOTE — H&P (Signed)
 History and Physical    Patient: Angela [name] FMW:[id number] DOB: Feb 23, 1950 DOA: 07/30/2023 DOS: the patient was seen and examined on 07/30/2023 PCP: Angela [name] LABOR, DO  Patient coming from: Transfer from [facility]  Chief Complaint:  Chief Complaint  Patient presents with   Abdominal Pain   Hematuria   Shortness of Breath   HPI: Angela [name] is a 73 y.o. female with medical history significant of HTN, HLD, glaucoma and prior tobacco abuse who presents with abdominal pain and bloating. She is accompanied by her husband and sister.  She has had constipation and hemorrhoids for the past three months. Her last bowel movement was on 2 days ago, and she has not been able to pass gas since then. She initially sought medical attention due to abdominal pain yesterday at [facility].  She underwent a CT scan of her abdomen pelvis, which noted mild left hydroureteronephrosis to the level of a obstructing 9 mm mid ureteral stone and large volume stool burden with stercoral colitis.  Urinalysis noted small hemoglobin, 21-50 RBCs/hpf, 6-10 squamous epithelial cells/hpf, and 6-10 WBCs.  She was given fentanyl  IV, Zofran , IV fluids, metronidazole , and cefepime .  Admission was recommended due to concern for sepsis from intra-abdominal infection, but patient declined for which she was prescribed Augmentin  and discharged home.  After returning home, she experienced increased abdominal pain and bloating and returned to the hospital the this morning.  Reported associated symptoms of shortness of breath as well as some bloody urine.  Her husband notes that her stomach is 4 times the size that it normally is.  No significant history of alcohol use and no current smoking history.  In the emergency department patient was noted to be tachypneic with blood pressures as low as 102/59.  Labs revealed CBC of 8.8 with left shift, sodium 132, CO2 21, BUN 32, creatinine 0.85, anion gap 17, and lactic acid  2.4.  Repeat renal CT revealed interval development of intraperitoneal free air and moderate volume free fluid containing layering hyperdensities consistent with bowel perforation, persistent large volume stool within the dilated sigmoid colon with interval increase dilation of stool-filled cecum and similar location of the distal left ureteral stone measuring 9 mm with interval decrease left hydroureteronephrosis.  Urinalysis noted trace hemoglobin, elevated specific gravity, no bacteria seen, 21-50 RBCs/hpf, and 11-20 WBCs.  Case have been discussed with Dr. Polly of surgery who recommended transfer ED to ED.  Patient had been bolused 500 mL normal saline IV fluids, fentanyl  25 mg IV, and started on empiric antibiotics of Zosyn.   Review of Systems: As mentioned in the history of present illness. All other systems reviewed and are negative. Past Medical History:  Diagnosis Date   Arthritis    Blood in stool    greater than 4 years since last incidence of blood in stool    Colon polyps    Glaucoma    Hyperlipidemia    Hypertension    Inclusion cyst 2021   face   PNA (pneumonia) 2006   Strain of left wrist 04/16/2019   Past Surgical History:  Procedure Laterality Date   ABSCESS DRAINAGE  2021   face- inclusion cyst   BREAST BIOPSY Left 07/19/2018   US  guided breast bx with clip placement- benign   EYE SURGERY  1956   HEMORRHOID SURGERY  2001   RADIOACTIVE SEED GUIDED EXCISIONAL BREAST BIOPSY Left 03/15/2019   Procedure: RADIOACTIVE SEED GUIDED EXCISIONAL LEFT  BREAST BIOPSY;  Surgeon: Gladis Cough,  MD;  Location: [facility];  Service: General;  Laterality: Left;   TONSILLECTOMY  1963   WISDOM TOOTH EXTRACTION     Social History:  reports that she has quit smoking. Her smoking use included cigars and cigarettes. She has never used smokeless tobacco. She reports current alcohol use of about 4.0 standard drinks of alcohol per week. She reports that she does not use  drugs.  Allergies  Allergen Reactions   Codeine Other (See Comments)    Irritable , insomnia    Grass Pollen(K-O-R-T-Swt Vern) Other (See Comments)    Trees    Pollen Extract     Family History  Problem Relation Age of Onset   Heart attack Father    Heart attack Sister    Hyperlipidemia Sister    Hypertension Sister     Prior to Admission medications   Medication Sig Start Date End Date Taking? Authorizing Provider  ABRYSVO 120 MCG/0.5ML injection  12/08/21   [provider]  albuterol  (VENTOLIN  HFA) 108 (90 Base) MCG/ACT inhaler Inhale 2 puffs into the lungs every 6 (six) hours as needed for wheezing or shortness of breath. 03/03/22   Kuneff, Renee A, DO  amoxicillin -clavulanate (AUGMENTIN ) 875-125 MG tablet Take 1 tablet by mouth 2 (two) times daily. 06/23/23   Kuneff, Renee A, DO  amoxicillin -clavulanate (AUGMENTIN ) 875-125 MG tablet Take 1 tablet by mouth every 12 (twelve) hours. 07/29/23   Zackowski, Scott, MD  atorvastatin  (LIPITOR) 20 MG tablet Take 1 tablet (20 mg total) by mouth at bedtime. Patient not taking: Reported on 04/19/2023 11/30/21   Angela Fuller A, DO  Calcium  Polycarbophil (FIBER-CAPS PO) Take by mouth.    [provider]  HYDROcodone -acetaminophen  (NORCO/VICODIN) 5-325 MG tablet Take 1 tablet by mouth every 6 (six) hours as needed for moderate pain (pain score 4-6). 06/23/23   Kuneff, Renee A, DO  HYDROcodone -acetaminophen  (NORCO/VICODIN) 5-325 MG tablet Take 1 tablet by mouth every 6 (six) hours as needed for moderate pain (pain score 4-6). 07/29/23   Zackowski, Scott, MD  hydrocortisone  (ANUSOL -HC) 25 MG suppository Place 1 suppository (25 mg total) rectally 2 (two) times daily. 06/23/23   Kuneff, Renee A, DO  hydrocortisone  cream 1 % Apply 1 Application topically 2 (two) times daily. 06/08/23   Kuneff, Renee A, DO  latanoprost (XALATAN) 0.005 % ophthalmic solution Place 1 drop into both eyes at bedtime. 08/11/17   [provider]  lisinopril   (ZESTRIL ) 10 MG tablet TAKE 1 TABLET BY MOUTH DAILY 05/22/23   Kuneff, Renee A, DO  Multiple Vitamin (MULTIVITAMIN) tablet Take 1 tablet by mouth daily.    [provider]  ondansetron  (ZOFRAN -ODT) 4 MG disintegrating tablet Take 1 tablet (4 mg total) by mouth every 8 (eight) hours as needed for nausea or vomiting. 07/29/23   Zackowski, Scott, MD  Probiotic Product (DAILY PROBIOTIC PO) Take by mouth.    [provider]  timolol (TIMOPTIC) 0.5 % ophthalmic solution  11/14/19   [provider]    Physical Exam: Vitals:   07/30/23 9077 07/30/23 0923 07/30/23 0923 07/30/23 1100  BP:  107/74  (!) 102/59  Pulse:  97  95  Resp:  20  (!) 24  Temp:  98 F (36.7 C)    TempSrc:  Oral    SpO2: 98% 100%  94%  Weight:   57.2 kg   Height:   5' 4 (1.626 m)    Constitutional: Elderly female who appears acutely ill Eyes: PERRL, lids and conjunctivae normal  ENMT: Mucous membranes are dry.  Normal dentition.  Neck: normal, supple  Respiratory: Mildly tachypneic with decreased aeration but no significant wheezes or rhonchi appreciated. Cardiovascular: Regular rate and rhythm, no murmurs / rubs / gallops.  2+ pedal pulses.   Abdomen: Significantly distended abdomen that is tender to palpation.  Minimal bowel sounds appreciated. Musculoskeletal: no clubbing / cyanosis.  . Good ROM, no contractures. Normal muscle tone.  Skin: no rashes, lesions, ulcers. No induration Neurologic: CN 2-12 grossly intact.   Strength 5/5 in all 4.  Psychiatric: Normal judgment and insight. Alert and oriented x 3. Normal mood.   Data Reviewed:  Reviewed labs, imaging, pertinent records as documented Assessment and Plan:  Sepsis secondary to bowel perforation Acute.  Patient had been tachycardic and tachypneic with initially seen yesterday meeting SIRS criteria.  Labs today note white blood cell count 8.8 with left shift.  CT of the abdomen pelvis noting bowel perforation.  Lactic acid initially 2.4  with repeat check 1.8.  Patient had been started on empiric antibiotics of Zosyn.  General surgery consulted for need of surgery - Admit to a progressive bed - N.p.o. for need of procedure - Fentanyl  IV as needed for pain - Continue empiric antibiotics of Zosyn - General Surgery consulted, will follow-up for any further recommendations  Hydroureteronephrosis secondary to left ureteral stone   Acute.  CT noted similar appearance of the 9 mm distal left ureteral stone with mild decreased left hydroureteronephrosis present. - Consider discussing with urology after patient's acute issues have been rest  Chronic idiopathic constipation Patient is a 35-month history of constipation with hemorrhoids.  CT noted large stool burden still present.  Metabolic acidosis with elevated anion gap Acute.  CO2 21 and anion gap 17.  Thought secondary to lactic acidosis with bowel perforation - Continue to monitor    Hyponatremia Acute.  Sodium noted to be 132. - Continue IV fluids - Recheck levels in a.m.  Essential hypertension Blood pressures were noted to be soft on admission. - Hold lisinopril   Glaucoma - Continue home eyedrops once med reconciliation completed  DVT prophylaxis: SCDs Advance Care Planning:   Code Status: Full Code   Consults: General Surgery  Family Communication: Sister updated at bedside  Severity of Illness: The appropriate patient status for this patient is INPATIENT. Inpatient status is judged to be reasonable and necessary in order to provide the required intensity of service to ensure the patient's safety. The patient's presenting symptoms, physical exam findings, and initial radiographic and laboratory data in the context of their chronic comorbidities is felt to place them at high risk for further clinical deterioration. Furthermore, it is not anticipated that the patient will be medically stable for discharge from the hospital within 2 midnights of admission.   * I  certify that at the point of admission it is my clinical judgment that the patient will require inpatient hospital care spanning beyond 2 midnights from the point of admission due to high intensity of service, high risk for further deterioration and high frequency of surveillance required.*  Author: Maximino DELENA Sharps, MD 07/30/2023 1:11 PM  For on call review [email or web address].

## 2023-07-30 NOTE — Anesthesia Procedure Notes (Signed)
 Procedure Name: Intubation Date/Time: 07/30/2023 3:10 PM  Performed by: Epifanio Charleston, MDPre-anesthesia Checklist: Patient identified, Emergency Drugs available, Suction available and Patient being monitored Patient Re-evaluated:Patient Re-evaluated prior to induction Oxygen Delivery Method: Circle system utilized Preoxygenation: Pre-oxygenation with 100% oxygen Induction Type: IV induction Ventilation: Mask ventilation without difficulty Laryngoscope Size: Miller, Glidescope and 3 Tube type: Oral Tube size: 7.0 mm Number of attempts: 2 Airway Equipment and Method: Stylet, Oral airway, Video-laryngoscopy and Rigid stylet Placement Confirmation: ETT inserted through vocal cords under direct vision, positive ETCO2 and breath sounds checked- equal and bilateral Secured at: 22 cm Tube secured with: Tape Dental Injury: Teeth and Oropharynx as per pre-operative assessment  Difficulty Due To: Difficulty was unanticipated and Difficult Airway- due to anterior larynx Future Recommendations: Recommend- induction with short-acting agent, and alternative techniques readily available Comments: SIVI RSI. DL x1 by CRNA with minimal view of base of cords using Miller 2 blade. Switched positions and sent for glidescope. DL x1 by myself yielded similar view of posterior arytenoids and limited view of airway. Attempted to place tube but view obscured when tube passing. One small breath given without return of ETCO2 and tube removed for presumed esophageal intubation. Glidescope present at this point but O2 in 80's. Pt mask ventilated with cricoid pressure and small breaths to limit gastric insufflation. Sats improved to 90's. Glidescope intubation with Grade II view of cords. ATOI with 7.0 ETT. Cuff inflated, +BBS, +ETCO2, Tube secured at 22cm at lip.

## 2023-07-30 NOTE — Transfer of Care (Signed)
 Immediate Anesthesia Transfer of Care Note  Patient: Angela [name]  Procedure(s) Performed: LAPAROTOMY, EXPLORATORY COLECTOMY, SIGMOID, OPEN (Abdomen)  Patient Location: PACU  Anesthesia Type:General  Level of Consciousness: awake, alert , and oriented  Airway & Oxygen Therapy: Patient Spontanous Breathing and Patient connected to face mask oxygen  Post-op Assessment: Report given to RN and Post -op Vital signs reviewed and stable  Post vital signs: Reviewed and stable  Last Vitals:  Vitals Value Taken Time  BP 126/81 07/30/23 18:32  Temp    Pulse 116 07/30/23 18:37  Resp 23 07/30/23 18:40  SpO2 95 % 07/30/23 18:37  Vitals shown include unfiled device data.  Last Pain:  Vitals:   07/30/23 1427  TempSrc: Oral  PainSc:          Complications: No notable events documented.

## 2023-07-30 NOTE — Consult Note (Signed)
 Angela [name] Jul 05, 1950  [id number].    Requesting MD: Claudene Chief Complaint/Reason for Consult: Pneumoperitoneum  HPI:  73 y/o F w/ a hx of HTN and chronic constipation who presented to the ED with several months of abdominal pain that had worsened over the past few days. She was initially seen on 6/21. CT at that time showed a renal stone and evidence of stercolitis. She was discharged to home with urology follow up. Her pain failed to improve and she re-presented on 6/22. CT today shows pneumoperitoneum with a moderate amount of free fluid. Her SBP was in the 90s. She was started on sepsis protocol.    On exam, patient is resting in bed and appears uncomfortable. Last BM Friday. Last colonoscopy in 2021 and showed diverticulosis and a sessile polyp in the ascending colon. She had an EGD previously and was told she had an ulcer, but she is not on a PPI and denies frequent GERD.   ROS: Review of Systems  Constitutional: Negative.   HENT: Negative.    Eyes: Negative.   Respiratory: Negative.    Cardiovascular: Negative.   Gastrointestinal:  Positive for abdominal pain and constipation.  Genitourinary: Negative.   Musculoskeletal: Negative.   Skin: Negative.   Neurological: Negative.   Endo/Heme/Allergies: Negative.   Psychiatric/Behavioral: Negative.      Family History  Problem Relation Age of Onset   Heart attack Father    Heart attack Sister    Hyperlipidemia Sister    Hypertension Sister     Past Medical History:  Diagnosis Date   Arthritis    Blood in stool    greater than 4 years since last incidence of blood in stool    Colon polyps    Glaucoma    Hyperlipidemia    Hypertension    Inclusion cyst 2021   face   PNA (pneumonia) 2006   Strain of left wrist 04/16/2019    Past Surgical History:  Procedure Laterality Date   ABSCESS DRAINAGE  2021   face- inclusion cyst   BREAST BIOPSY Left 07/19/2018   US  guided breast bx with clip placement- benign    EYE SURGERY  1956   HEMORRHOID SURGERY  2001   RADIOACTIVE SEED GUIDED EXCISIONAL BREAST BIOPSY Left 03/15/2019   Procedure: RADIOACTIVE SEED GUIDED EXCISIONAL LEFT  BREAST BIOPSY;  Surgeon: Gladis Cough, MD;  Location: [facility];  Service: General;  Laterality: Left;   TONSILLECTOMY  1963   WISDOM TOOTH EXTRACTION      Social History:  reports that she has quit smoking. Her smoking use included cigars and cigarettes. She has never used smokeless tobacco. She reports current alcohol use of about 4.0 standard drinks of alcohol per week. She reports that she does not use drugs.  Allergies:  Allergies  Allergen Reactions   Codeine Other (See Comments)    Irritable , insomnia    Grass Pollen(K-O-R-T-Swt Vern) Other (See Comments)    Trees    Pollen Extract     (Not in a hospital admission)   Physical Exam: Blood pressure 102/62, pulse 95, temperature 97.8 F (36.6 C), temperature source Oral, resp. rate 20, height 5' 4 (1.626 m), weight 57.2 kg, SpO2 92%. Gen: female, NAD Abd: distended, diffusely tender, + guarding  Results for orders placed or performed during the hospital encounter of 07/30/23 (from the past 48 hours)  Urinalysis, Routine w reflex microscopic -Urine, Clean Catch     Status: Abnormal   Collection  Time: 07/30/23  9:36 AM  Result Value Ref Range   Color, Urine YELLOW YELLOW   APPearance HAZY (A) CLEAR   Specific Gravity, Urine 1.032 (H) 1.005 - 1.030   pH 6.0 5.0 - 8.0   Glucose, UA NEGATIVE NEGATIVE mg/dL   Hgb urine dipstick TRACE (A) NEGATIVE   Bilirubin Urine SMALL (A) NEGATIVE   Ketones, ur 15 (A) NEGATIVE mg/dL   Protein, ur 30 (A) NEGATIVE mg/dL   Nitrite NEGATIVE NEGATIVE   Leukocytes,Ua NEGATIVE NEGATIVE   RBC / HPF 21-50 0 - 5 RBC/hpf   WBC, UA 11-20 0 - 5 WBC/hpf   Bacteria, UA NONE SEEN NONE SEEN   Squamous Epithelial / HPF 0-5 0 - 5 /HPF   Mucus PRESENT    Hyaline Casts, UA PRESENT     Comment: Performed at [facility], [address], [place], [place] [address]  CBC with Differential/Platelet     Status: Abnormal   Collection Time: 07/30/23  9:36 AM  Result Value Ref Range   WBC 8.8 4.0 - 10.5 K/uL   RBC 4.41 3.87 - 5.11 MIL/uL   Hemoglobin 12.9 12.0 - 15.0 g/dL   HCT 62.5 63.9 - 53.9 %   MCV 84.8 80.0 - 100.0 fL   MCH 29.3 26.0 - 34.0 pg   MCHC 34.5 30.0 - 36.0 g/dL   RDW 86.7 88.4 - 84.4 %   Platelets 344 150 - 400 K/uL   nRBC 0.0 0.0 - 0.2 %   Neutrophils Relative % 90 %   Neutro Abs 7.9 (H) 1.7 - 7.7 K/uL   Lymphocytes Relative 7 %   Lymphs Abs 0.6 (L) 0.7 - 4.0 K/uL   Monocytes Relative 2 %   Monocytes Absolute 0.2 0.1 - 1.0 K/uL   Eosinophils Relative 0 %   Eosinophils Absolute 0.0 0.0 - 0.5 K/uL   Basophils Relative 0 %   Basophils Absolute 0.0 0.0 - 0.1 K/uL   WBC Morphology INCREASED BANDS (>20% BANDS)    RBC Morphology MORPHOLOGY UNREMARKABLE    Smear Review MORPHOLOGY UNREMARKABLE    Immature Granulocytes 1 %   Abs Immature Granulocytes 0.09 (H) 0.00 - 0.07 K/uL    Comment: Performed at [facility], [address], [place], [place] [address]  Comprehensive metabolic panel with GFR     Status: Abnormal   Collection Time: 07/30/23  9:36 AM  Result Value Ref Range   Sodium 132 (L) 135 - 145 mmol/L   Potassium 4.0 3.5 - 5.1 mmol/L   Chloride 95 (L) 98 - 111 mmol/L   CO2 21 (L) 22 - 32 mmol/L   Glucose, Bld 142 (H) 70 - 99 mg/dL    Comment: Glucose reference range applies only to samples taken after fasting for at least 8 hours.   BUN 32 (H) 8 - 23 mg/dL   Creatinine, Ser 9.14 0.44 - 1.00 mg/dL   Calcium  9.5 8.9 - 10.3 mg/dL   Total Protein 6.1 (L) 6.5 - 8.1 g/dL   Albumin 2.8 (L) 3.5 - 5.0 g/dL   AST 23 15 - 41 U/L   ALT 14 0 - 44 U/L   Alkaline Phosphatase 60 38 - 126 U/L   Total Bilirubin 0.6 0.0 - 1.2 mg/dL   GFR, Estimated >39 >39 mL/min    Comment: (NOTE) Calculated using the CKD-EPI Creatinine Equation (2021)    Anion  gap 17 (H) 5 - 15    Comment: Performed at [facility], [phone]  [place], [place], [place] [address]  Lipase, blood     Status: Abnormal   Collection Time: 07/30/23  9:36 AM  Result Value Ref Range   Lipase <10 (L) 11 - 51 U/L    Comment: Performed at [facility], [address], [place], [place] [address]  Lactic acid, plasma     Status: Abnormal   Collection Time: 07/30/23 10:20 AM  Result Value Ref Range   Lactic Acid, Venous 2.4 (HH) 0.5 - 1.9 mmol/L    Comment: Critical Value, Read Back and verified with TIM [facility] 07/30/23 AT 1058 HS Performed at [facility], [address], [place], [place] [address]   Lactic acid, plasma     Status: None   Collection Time: 07/30/23 12:13 PM  Result Value Ref Range   Lactic Acid, Venous 1.8 0.5 - 1.9 mmol/L    Comment: Performed at [facility], [address] [place], [place] [address]   DG Chest Port 1 View Result Date: 07/30/2023 CLINICAL DATA:  Abdominal pain EXAM: PORTABLE CHEST 1 VIEW COMPARISON:  07/29/2023 FINDINGS: Heart size and mediastinal contours are normal. Aortic atherosclerotic calcifications. Low lung volumes. No pleural fluid, interstitial edema or airspace consolidation. The visualized osseous structures appear intact. IMPRESSION: Low lung volumes. No acute findings. Electronically Signed   By: Waddell Calk M.D.   On: 07/30/2023 10:23   CT Renal Stone Study Result Date: 07/30/2023 CLINICAL DATA:  Known left ureteral stone with worsening abdominal pain and hematuria EXAM: CT ABDOMEN AND PELVIS WITHOUT CONTRAST TECHNIQUE: Multidetector CT imaging of the abdomen and pelvis was performed following the standard protocol without IV contrast. RADIATION DOSE REDUCTION: This exam was performed according to the departmental dose-optimization program which includes automated exposure control, adjustment of the mA and/or kV according to patient size and/or use of  iterative reconstruction technique. COMPARISON:  CT abdomen and pelvis dated 07/29/2023 FINDINGS: Lower chest: No focal consolidation or pulmonary nodule in the lung bases. No pleural effusion or pneumothorax demonstrated. Partially imaged heart size is normal. Coronary artery calcifications. Hepatobiliary: Unchanged segment 2 cyst. No intra or extrahepatic biliary ductal dilation. Vicariously excreted contrast within the gallbladder. Pancreas: No focal lesions or main ductal dilation. Spleen: Normal in size without focal abnormality. Adrenals/Urinary Tract: No adrenal nodules. Interval decreased left hydroureteronephrosis. Excreted contrast material within the renal collecting systems. Similar location of distal left ureteral stone measuring 9 mm (2:54). Urinary bladder is decompressed. Stomach/Bowel: Normal appearance of the stomach. Persistent large volume stool within the dilated sigmoid colon. Interval increased dilation of the stool-filled cecum. Appendix is not discretely seen. Vascular/Lymphatic: Aortic atherosclerosis. No enlarged abdominal or pelvic lymph nodes. Reproductive: No adnexal masses. Other: Interval development of intraperitoneal free air. New moderate volume free fluid containing layering hyperdensities. Musculoskeletal: No acute or abnormal lytic or blastic osseous lesions. Multilevel degenerative changes of the partially imaged thoracic and lumbar spine. Small fat-containing paraumbilical hernia. IMPRESSION: 1. Interval development of intraperitoneal free air and moderate volume free fluid containing layering hyperdensities, consistent with bowel perforation. 2. Persistent large volume stool within the dilated sigmoid colon. Interval increased dilation of the stool-filled cecum. 3. Similar location of distal left ureteral stone measuring 9 mm with interval decreased left hydroureteronephrosis. 4.  Aortic Atherosclerosis (ICD10-I70.0). Critical Value/emergent results were called by telephone  at the time of interpretation on 07/30/2023 at 10:06 am to provider SCOTT ZACKOWSKI , who verbally acknowledged these results. Electronically Signed   By: Limin  Xu M.D.   On: 07/30/2023 10:12  CT ABDOMEN PELVIS W CONTRAST Result Date: 07/29/2023 CLINICAL DATA:  Six week history of left lower quadrant abdominal pain associated with nausea and chills EXAM: CT ABDOMEN AND PELVIS WITH CONTRAST TECHNIQUE: Multidetector CT imaging of the abdomen and pelvis was performed using the standard protocol following bolus administration of intravenous contrast. RADIATION DOSE REDUCTION: This exam was performed according to the departmental dose-optimization program which includes automated exposure control, adjustment of the mA and/or kV according to patient size and/or use of iterative reconstruction technique. CONTRAST:  OMNIPAQUE  IOHEXOL  300 MG/ML  SOLN COMPARISON:  CT abdomen and pelvis dated 09/17/2020 FINDINGS: Lower chest: No focal consolidation or pulmonary nodule in the lung bases. No pleural effusion or pneumothorax demonstrated. Partially imaged heart size is normal. Hepatobiliary: Unchanged 1.7 cm segment 2 hypodensity (2:14), likely cyst. Additional subcentimeter hypodensity (2:17), too small to characterize but also likely cysts. No intra or extrahepatic biliary ductal dilation. Normal gallbladder. Pancreas: No focal lesions or main ductal dilation. Spleen: Normal in size without focal abnormality. Adrenals/Urinary Tract: No adrenal nodules. Mild left hydroureteronephrosis to the level of an obstructing 9 mm mid ureteral stone. Bilateral simple/minimally complicated cysts. No focal bladder wall thickening. Stomach/Bowel: Normal appearance of the stomach. Mural thickening of the sigmoid colon, which contains large volume stool. Moderate volume stool throughout the colon. Appendix is not discretely seen. Vascular/Lymphatic: Aortic atherosclerosis. No enlarged abdominal or pelvic lymph nodes. Reproductive: No  adnexal masses. Other: Trace pelvic free fluid.  No free air or fluid collection. Musculoskeletal: No acute or abnormal lytic or blastic osseous lesions. Multilevel degenerative changes of the partially imaged thoracic and lumbar spine. Small fat-containing left inguinal hernia. IMPRESSION: 1. Mild left hydroureteronephrosis to the level of an obstructing 9 mm mid ureteral stone. 2. Mural thickening of the sigmoid colon, which contains large volume stool, which may be related to stercoral colitis. Consider correlation with colonoscopy once acute symptoms have resolved to exclude underlying mass lesion in this area if one has not been performed recently. 3.  Aortic Atherosclerosis (ICD10-I70.0). Electronically Signed   By: Limin  Xu M.D.   On: 07/29/2023 09:54   DG Chest Port 1 View Result Date: 07/29/2023 CLINICAL DATA:  Lower abdominal pain, nausea, chills EXAM: PORTABLE CHEST 1 VIEW COMPARISON:  None Available. FINDINGS: Normal lung volumes. No focal consolidations. No pleural effusion or pneumothorax. The heart size and mediastinal contours are within normal limits. No acute osseous abnormality. IMPRESSION: No acute disease. Electronically Signed   By: Limin  Xu M.D.   On: 07/29/2023 08:41    Assessment/Plan 73 y/o F w/ a hx of chronic constipation p/w several days of abdominal pain with CT showing pneumoperitoneum   - Given her exam and CT findings I have recommended exploration in the OR. I expect she will need a partial colectomy and ostomy, however, we did discuss the possibility of finding a perforation in the upper GI tract and how this would alter the plan. We discussed the alternatives and potential risks of surgery, including but not limited to: bleeding, infection, damage to bowel or surrounding complications, ostomy complications, damage to the ureter, and need for additional procedures. All questions were addressed and consent was obtained.    Cordella DELENA Polly Marlis Cheron  Surgery 07/30/2023, 1:20 PM Please see Amion for pager number during day hours 7:00am-4:30pm or 7:00am -11:30am on weekends

## 2023-07-30 NOTE — ED Notes (Signed)
 CRITICAL VALUE STICKER  CRITICAL VALUE:Lactate 2.4  RECEIVER (on-site recipient of call):ONEIDA Sharps, RN  DATE & TIME NOTIFIED:   MESSENGER (representative from lab):  MD NOTIFIED: Zackowski  TIME OF NOTIFICATION:1058  RESPONSE:

## 2023-07-31 ENCOUNTER — Encounter (HOSPITAL_COMMUNITY): Payer: Self-pay | Admitting: General Surgery

## 2023-07-31 ENCOUNTER — Other Ambulatory Visit: Payer: Self-pay

## 2023-07-31 DIAGNOSIS — K631 Perforation of intestine (nontraumatic): Secondary | ICD-10-CM | POA: Diagnosis not present

## 2023-07-31 LAB — CBC
HCT: 28.4 % — ABNORMAL LOW (ref 36.0–46.0)
Hemoglobin: 9.1 g/dL — ABNORMAL LOW (ref 12.0–15.0)
MCH: 28.7 pg (ref 26.0–34.0)
MCHC: 32 g/dL (ref 30.0–36.0)
MCV: 89.6 fL (ref 80.0–100.0)
Platelets: 226 10*3/uL (ref 150–400)
RBC: 3.17 MIL/uL — ABNORMAL LOW (ref 3.87–5.11)
RDW: 13.2 % (ref 11.5–15.5)
WBC: 9.1 10*3/uL (ref 4.0–10.5)
nRBC: 0 % (ref 0.0–0.2)

## 2023-07-31 LAB — POCT I-STAT 7, (LYTES, BLD GAS, ICA,H+H)
Acid-base deficit: 4 mmol/L — ABNORMAL HIGH (ref 0.0–2.0)
Bicarbonate: 22.5 mmol/L (ref 20.0–28.0)
Calcium, Ion: 1.16 mmol/L (ref 1.15–1.40)
HCT: 30 % — ABNORMAL LOW (ref 36.0–46.0)
Hemoglobin: 10.2 g/dL — ABNORMAL LOW (ref 12.0–15.0)
O2 Saturation: 97 %
Patient temperature: 37.1
Potassium: 4 mmol/L (ref 3.5–5.1)
Sodium: 133 mmol/L — ABNORMAL LOW (ref 135–145)
TCO2: 24 mmol/L (ref 22–32)
pCO2 arterial: 44.9 mmHg (ref 32–48)
pH, Arterial: 7.309 — ABNORMAL LOW (ref 7.35–7.45)
pO2, Arterial: 101 mmHg (ref 83–108)

## 2023-07-31 LAB — URINE CULTURE: Culture: NO GROWTH

## 2023-07-31 LAB — COMPREHENSIVE METABOLIC PANEL WITH GFR
ALT: 10 U/L (ref 0–44)
AST: 16 U/L (ref 15–41)
Albumin: 1.6 g/dL — ABNORMAL LOW (ref 3.5–5.0)
Alkaline Phosphatase: 25 U/L — ABNORMAL LOW (ref 38–126)
Anion gap: 13 (ref 5–15)
BUN: 22 mg/dL (ref 8–23)
CO2: 17 mmol/L — ABNORMAL LOW (ref 22–32)
Calcium: 7.5 mg/dL — ABNORMAL LOW (ref 8.9–10.3)
Chloride: 104 mmol/L (ref 98–111)
Creatinine, Ser: 0.66 mg/dL (ref 0.44–1.00)
GFR, Estimated: >60 mL/min (ref >60)
Glucose, Bld: 83 mg/dL (ref 70–99)
Potassium: 4 mmol/L (ref 3.5–5.1)
Sodium: 134 mmol/L — ABNORMAL LOW (ref 135–145)
Total Bilirubin: 0.7 mg/dL (ref 0.0–1.2)
Total Protein: 3.6 g/dL — ABNORMAL LOW (ref 6.5–8.1)

## 2023-07-31 MED ORDER — METHOCARBAMOL 1000 MG/10ML IJ SOLN
500.0000 mg | Freq: Three times a day (TID) | INTRAMUSCULAR | Status: DC
  Administered 2023-07-31 – 2023-09-01 (×32): 500 mg via INTRAVENOUS
  Filled 2023-07-31 (×35): qty 10

## 2023-07-31 MED ORDER — TIMOLOL MALEATE 0.5 % OP SOLN
1.0000 [drp] | Freq: Two times a day (BID) | OPHTHALMIC | Status: DC
  Administered 2023-07-31 – 2023-09-04 (×62): 1 [drp] via OPHTHALMIC
  Filled 2023-07-31 (×2): qty 5

## 2023-07-31 MED ORDER — HYDROMORPHONE HCL 1 MG/ML IJ SOLN
0.5000 mg | INTRAMUSCULAR | Status: DC | PRN
  Administered 2023-07-31 – 2023-09-03 (×116): 1 mg via INTRAVENOUS
  Filled 2023-07-31 (×120): qty 1

## 2023-07-31 MED ORDER — METHOCARBAMOL 500 MG PO TABS
500.0000 mg | ORAL_TABLET | Freq: Three times a day (TID) | ORAL | Status: DC
  Administered 2023-07-31 – 2023-09-04 (×72): 500 mg via ORAL
  Filled 2023-07-31 (×78): qty 1

## 2023-07-31 NOTE — Consult Note (Addendum)
 WOC Nurse ostomy consult note  Stoma type/location: LLQ colostomy Stomal assessment/size: 1 5/8 inch stoma, round, budded, os at center, pink, moist   Peristomal assessment: unable to assess, current appliance in place, placed 6/22, intact Treatment options for stomal/peristomal skin: plan to utilize 2 inch barrier ring with next change  Output: none in bag Ostomy pouching: 2pc. 2 3/4 inch flat Education provided: Met with patient and spouse at bedside, provided educational materials and supplies ordering guide.  Discussed ostomy, including frequency of emptying/ changing bags, discussed pouching steps.  Discussed scheduling next teaching session, patient and spouse request Thursday as patient hopes to be more mobile and feeling better by that time to engage more fully in teaching. Enrolled patient in [place] [employer] Discharge program: Yes  WOC team will continue to follow patient for ostomy teaching.  Doyal Polite, RN, MSN, [facility] WOC Team

## 2023-07-31 NOTE — Progress Notes (Signed)
 1 Day Post-Op  Subjective: Patient awake and appropriately doesn't feel great today but eager to get out of bed.  No nausea, but feels distended.  Discussed operative findings with patient and family member at bedside.  Family member asking about kidney stone.  ROS: See above, otherwise other systems negative  Objective: Vital signs in last 24 hours: Temp:  [97.5 F (36.4 C)-98.2 F (36.8 C)] 97.7 F (36.5 C) (06/23 0400) Pulse Rate:  [95-119] 113 (06/23 0600) Resp:  [13-25] 21 (06/23 0600) BP: (86-128)/(57-83) 106/69 (06/23 0600) SpO2:  [91 %-100 %] 94 % (06/23 0600) Arterial Line BP: (99-142)/(47-65) 132/61 (06/23 0600) Weight:  [57.2 kg-61.8 kg] 61.8 kg (06/23 0300) Last BM Date :  (PTA)  Intake/Output from previous day: 06/22 0701 - 06/23 0700 In: 5846.3 [I.V.:4242.8; IV Piggyback:1603.5] Out: 445 [Urine:345; Blood:100] Intake/Output this shift: No intake/output data recorded.  PE: Gen: NAD, laying in bed Heart: regular Lungs: effort nonlabored  Abd: soft, but with some distention, colostomy viable and no output.  Midline wound is clean and packed.   GU: foley in place with clear yellow urine  Lab Results:  Recent Labs    07/30/23 2039 07/31/23 0257  WBC 8.1 9.1  HGB 10.3* 9.1*  HCT 30.9* 28.4*  PLT 299 226   BMET Recent Labs    07/30/23 0936 07/30/23 1724 07/30/23 2039 07/31/23 0257  NA 132* 133*  --  134*  K 4.0 4.0  --  4.0  CL 95*  --   --  104  CO2 21*  --   --  17*  GLUCOSE 142*  --   --  83  BUN 32*  --   --  22  CREATININE 0.85  --  0.79 0.66  CALCIUM  9.5  --   --  7.5*   PT/INR Recent Labs    07/29/23 0826  LABPROT 14.0  INR 1.1   CMP     Component Value Date/Time   NA 134 (L) 07/31/2023 0257   NA 140 11/10/2007 0000   K 4.0 07/31/2023 0257   CL 104 07/31/2023 0257   CO2 17 (L) 07/31/2023 0257   GLUCOSE 83 07/31/2023 0257   BUN 22 07/31/2023 0257   BUN 10 11/10/2007 0000   CREATININE 0.66 07/31/2023 0257   CREATININE  0.74 06/26/2020 1522   CALCIUM  7.5 (L) 07/31/2023 0257   PROT 3.6 (L) 07/31/2023 0257   ALBUMIN 1.6 (L) 07/31/2023 0257   AST 16 07/31/2023 0257   ALT 10 07/31/2023 0257   ALKPHOS 25 (L) 07/31/2023 0257   BILITOT 0.7 07/31/2023 0257   GFRNONAA >60 07/31/2023 0257   Lipase     Component Value Date/Time   LIPASE <10 (L) 07/30/2023 0936       Studies/Results: DG Chest Port 1 View Result Date: 07/30/2023 CLINICAL DATA:  Abdominal pain EXAM: PORTABLE CHEST 1 VIEW COMPARISON:  07/29/2023 FINDINGS: Heart size and mediastinal contours are normal. Aortic atherosclerotic calcifications. Low lung volumes. No pleural fluid, interstitial edema or airspace consolidation. The visualized osseous structures appear intact. IMPRESSION: Low lung volumes. No acute findings. Electronically Signed   By: Waddell Calk M.D.   On: 07/30/2023 10:23   CT Renal Stone Study Result Date: 07/30/2023 CLINICAL DATA:  Known left ureteral stone with worsening abdominal pain and hematuria EXAM: CT ABDOMEN AND PELVIS WITHOUT CONTRAST TECHNIQUE: Multidetector CT imaging of the abdomen and pelvis was performed following the standard protocol without IV contrast. RADIATION DOSE REDUCTION: This exam was  performed according to the departmental dose-optimization program which includes automated exposure control, adjustment of the mA and/or kV according to patient size and/or use of iterative reconstruction technique. COMPARISON:  CT abdomen and pelvis dated 07/29/2023 FINDINGS: Lower chest: No focal consolidation or pulmonary nodule in the lung bases. No pleural effusion or pneumothorax demonstrated. Partially imaged heart size is normal. Coronary artery calcifications. Hepatobiliary: Unchanged segment 2 cyst. No intra or extrahepatic biliary ductal dilation. Vicariously excreted contrast within the gallbladder. Pancreas: No focal lesions or main ductal dilation. Spleen: Normal in size without focal abnormality. Adrenals/Urinary  Tract: No adrenal nodules. Interval decreased left hydroureteronephrosis. Excreted contrast material within the renal collecting systems. Similar location of distal left ureteral stone measuring 9 mm (2:54). Urinary bladder is decompressed. Stomach/Bowel: Normal appearance of the stomach. Persistent large volume stool within the dilated sigmoid colon. Interval increased dilation of the stool-filled cecum. Appendix is not discretely seen. Vascular/Lymphatic: Aortic atherosclerosis. No enlarged abdominal or pelvic lymph nodes. Reproductive: No adnexal masses. Other: Interval development of intraperitoneal free air. New moderate volume free fluid containing layering hyperdensities. Musculoskeletal: No acute or abnormal lytic or blastic osseous lesions. Multilevel degenerative changes of the partially imaged thoracic and lumbar spine. Small fat-containing paraumbilical hernia. IMPRESSION: 1. Interval development of intraperitoneal free air and moderate volume free fluid containing layering hyperdensities, consistent with bowel perforation. 2. Persistent large volume stool within the dilated sigmoid colon. Interval increased dilation of the stool-filled cecum. 3. Similar location of distal left ureteral stone measuring 9 mm with interval decreased left hydroureteronephrosis. 4.  Aortic Atherosclerosis (ICD10-I70.0). Critical Value/emergent results were called by telephone at the time of interpretation on 07/30/2023 at 10:06 am to provider SCOTT ZACKOWSKI , who verbally acknowledged these results. Electronically Signed   By: Limin  Xu M.D.   On: 07/30/2023 10:12   CT ABDOMEN PELVIS W CONTRAST Result Date: 07/29/2023 CLINICAL DATA:  Six week history of left lower quadrant abdominal pain associated with nausea and chills EXAM: CT ABDOMEN AND PELVIS WITH CONTRAST TECHNIQUE: Multidetector CT imaging of the abdomen and pelvis was performed using the standard protocol following bolus administration of intravenous contrast.  RADIATION DOSE REDUCTION: This exam was performed according to the departmental dose-optimization program which includes automated exposure control, adjustment of the mA and/or kV according to patient size and/or use of iterative reconstruction technique. CONTRAST:  OMNIPAQUE  IOHEXOL  300 MG/ML  SOLN COMPARISON:  CT abdomen and pelvis dated 09/17/2020 FINDINGS: Lower chest: No focal consolidation or pulmonary nodule in the lung bases. No pleural effusion or pneumothorax demonstrated. Partially imaged heart size is normal. Hepatobiliary: Unchanged 1.7 cm segment 2 hypodensity (2:14), likely cyst. Additional subcentimeter hypodensity (2:17), too small to characterize but also likely cysts. No intra or extrahepatic biliary ductal dilation. Normal gallbladder. Pancreas: No focal lesions or main ductal dilation. Spleen: Normal in size without focal abnormality. Adrenals/Urinary Tract: No adrenal nodules. Mild left hydroureteronephrosis to the level of an obstructing 9 mm mid ureteral stone. Bilateral simple/minimally complicated cysts. No focal bladder wall thickening. Stomach/Bowel: Normal appearance of the stomach. Mural thickening of the sigmoid colon, which contains large volume stool. Moderate volume stool throughout the colon. Appendix is not discretely seen. Vascular/Lymphatic: Aortic atherosclerosis. No enlarged abdominal or pelvic lymph nodes. Reproductive: No adnexal masses. Other: Trace pelvic free fluid.  No free air or fluid collection. Musculoskeletal: No acute or abnormal lytic or blastic osseous lesions. Multilevel degenerative changes of the partially imaged thoracic and lumbar spine. Small fat-containing left inguinal hernia. IMPRESSION: 1. Mild left  hydroureteronephrosis to the level of an obstructing 9 mm mid ureteral stone. 2. Mural thickening of the sigmoid colon, which contains large volume stool, which may be related to stercoral colitis. Consider correlation with colonoscopy once acute  symptoms have resolved to exclude underlying mass lesion in this area if one has not been performed recently. 3.  Aortic Atherosclerosis (ICD10-I70.0). Electronically Signed   By: Limin  Xu M.D.   On: 07/29/2023 09:54   DG Chest Port 1 View Result Date: 07/29/2023 CLINICAL DATA:  Lower abdominal pain, nausea, chills EXAM: PORTABLE CHEST 1 VIEW COMPARISON:  None Available. FINDINGS: Normal lung volumes. No focal consolidations. No pleural effusion or pneumothorax. The heart size and mediastinal contours are within normal limits. No acute osseous abnormality. IMPRESSION: No acute disease. Electronically Signed   By: Limin  Xu M.D.   On: 07/29/2023 08:41    Anti-infectives: Anti-infectives (From admission, onward)    Start     Dose/Rate Route Frequency Ordered Stop   07/30/23 1800  piperacillin-tazobactam (ZOSYN) IVPB 3.375 g        3.375 g 12.5 mL/hr over 240 Minutes Intravenous Every 8 hours 07/30/23 1332     07/30/23 1400  piperacillin-tazobactam (ZOSYN) IVPB 3.375 g  Status:  Discontinued        3.375 g 100 mL/hr over 30 Minutes Intravenous Every 8 hours 07/30/23 1329 07/30/23 1332   07/30/23 1015  piperacillin-tazobactam (ZOSYN) IVPB 3.375 g        3.375 g 100 mL/hr over 30 Minutes Intravenous  Once 07/30/23 1013 07/30/23 1311        Assessment/Plan POD 1, s/p ex lap with Hartmann's procedure for stercoral colitis with ischemia and perforation with feculent peritonitis, Dr. Polly 6/22 -NPO x chips and sips with meds -await return of bowel function -mobilize -DC foley -continue abx for at least 5 days post op -BID dressing changes to midline wound -WOC to see today for colostomy care -pulm toilet  -stable to tx to progressive floor from surgery standpoint.  If do so, can DC a line from our standpoint as well  FEN - NPO X ice chips and sips with meds VTE - lovenox ID - zosyn x 5 days post op  Nephrolithiasis - per medicine Chronic constipation - will need aggressive bowel  regimen when bowel function returns HTN GLaucoma Anemia - hgb 9.1 this am    LOS: 1 day    Burnard FORBES Banter , [facility] 07/31/2023, 8:29 AM Please see Amion for pager number during day hours 7:00am-4:30pm or 7:00am -11:30am on weekends

## 2023-07-31 NOTE — Progress Notes (Signed)
 PROGRESS NOTE  IMONIE [name] FMW:[id number] DOB: 08-Jan-1951 DOA: 07/30/2023 PCP: Angela [name] LABOR, DO   LOS: 1 day   Brief narrative:   Angela [name] is a 73 y.o. female with medical history significant of HTN, HLD, glaucoma and prior tobacco abuse who presents with abdominal pain and bloating. S She underwent a CT scan of her abdomen pelvis, which noted mild left hydroureteronephrosis to the level of a obstructing 9 mm mid ureteral stone and large volume stool burden with stercoral colitis.  Urinalysis noted small hemoglobin, 21-50 RBCs/hpf, 6-10 squamous epithelial cells/hpf, and 6-10 WBCs.   Admission was recommended due to concern for sepsis from intra-abdominal infection, but patient declined for which she was prescribed Augmentin  and discharged home. After returning home, she experienced increased abdominal pain and bloating and returned to the hospital . In the emergency department patient was noted to be tachypneic.  Labs showed hyponatremia with sodium of 132, lactic acid 2.4.  Repeat renal CT revealed interval development of intraperitoneal free air and moderate volume free fluid containing layering hyperdensities consistent with bowel perforation, distal left ureteral stone measuring 9 mm with interval decrease left hydroureteronephrosis.  Patient was then admitted to the hospital for further evaluation and treatment.    Assessment/Plan: Principal Problem:   Bowel perforation (HCC) Active Problems:   Hydroureteronephrosis   Chronic idiopathic constipation   Metabolic acidosis with increased anion gap and accumulation of organic acids   Hyponatremia   Essential hypertension   Glaucoma   Perforated sigmoid colon (HCC)    Sepsis secondary to bowel perforation  CT of the abdomen pelvis noting bowel perforation.  Status post exploratory laparotomy with Hartmann procedure for stercoral colitis with ischemia and perforation with feculent peritonitis on 07/30/2023.  Was started on IV  Zosyn.  Will continue.  Currently n.p.o. I sips with meds.  Plan is to continue postoperative antibiotics for 5 days.  Wound care to follow for colostomy needs.   Hydroureteronephrosis secondary to left ureteral stone   Will need urology to see the patient for this.  Renal function is normal.  Has been having good urine.  Since patient underwent exploratory laparotomy yesterday,  Will continue to monitor.   Chronic idiopathic constipation Patient is a 46-month history of constipation with hemorrhoids.  CT noted large stool burden on presentation.  Currently status post surgery.   Metabolic acidosis with elevated anion gap Improved at this time.   Hyponatremia Sodium has improved to 134.   Essential hypertension Lisinopril  on hold.  Blood pressure 119/78.  Will continue to monitor.  Glaucoma Will resume timolol drops.  DVT prophylaxis: enoxaparin (LOVENOX) injection 40 mg Start: 07/31/23 1000 SCDs Start: 07/30/23 1954 SCDs Start: 07/30/23 1953   Disposition: Uncertain at this time.  Likely need rehabilitation.  Status is: Inpatient Remains inpatient appropriate because: Pending clinical improvement    Code Status:     Code Status: Full Code  Family Communication: Spoke with the patient's spouse and daughter at bedside.  Consultants: General Surgery  Procedures: Sigmoidectomy with end colostomy  Anti-infectives:  Zosyn IV  Anti-infectives (From admission, onward)    Start     Dose/Rate Route Frequency Ordered Stop   07/30/23 1800  piperacillin-tazobactam (ZOSYN) IVPB 3.375 g        3.375 g 12.5 mL/hr over 240 Minutes Intravenous Every 8 hours 07/30/23 1332     07/30/23 1400  piperacillin-tazobactam (ZOSYN) IVPB 3.375 g  Status:  Discontinued        3.375 g 100  mL/hr over 30 Minutes Intravenous Every 8 hours 07/30/23 1329 07/30/23 1332   07/30/23 1015  piperacillin-tazobactam (ZOSYN) IVPB 3.375 g        3.375 g 100 mL/hr over 30 Minutes Intravenous  Once 07/30/23  1013 07/30/23 1311       Subjective: Today, patient was seen and examined at bedside.  Patient denies any nausea, vomiting, feels fatigued.  Denies any fever, chills or rigor.  Family at bedside.  Objective: Vitals:   07/31/23 1200 07/31/23 1300  BP:  119/68  Pulse: (!) 115 (!) 113  Resp: (!) 37 (!) 25  Temp:    SpO2: 95% 94%    Intake/Output Summary (Last 24 hours) at 07/31/2023 1441 Last data filed at 07/31/2023 1300 Gross per 24 hour  Intake 5709.33 ml  Output 595 ml  Net 5114.33 ml   Filed Weights   07/30/23 1405 07/30/23 1930 07/31/23 0300  Weight: 57.2 kg 60 kg 61.8 kg   Body mass index is 23.39 kg/m.   Physical Exam: GENERAL: Patient is alert awake and oriented. Not in obvious distress.  On nasal cannula oxygen HENT: No scleral pallor or icterus. Pupils equally reactive to light. Oral mucosa is moist NECK: is supple, no gross swelling noted. CHEST: Diminished breath sounds bilaterally. CVS: S1 and S2 heard, no murmur. Regular rate and rhythm.  ABDOMEN: Soft, dressing in the abdomen.  Appropriate tenderness noted.  Colostomy in place.   EXTREMITIES: No edema. CNS: Cranial nerves are intact.  Generalized weakness noted SKIN: warm and dry without rashes.  Data Review: I have personally reviewed the following laboratory data and studies,  CBC: Recent Labs  Lab 07/29/23 0826 07/30/23 0936 07/30/23 1724 07/30/23 2039 07/31/23 0257  WBC 12.6* 8.8  --  8.1 9.1  NEUTROABS 10.8* 7.9*  --   --   --   HGB 11.6* 12.9 10.2* 10.3* 9.1*  HCT 34.5* 37.4 30.0* 30.9* 28.4*  MCV 84.8 84.8  --  87.0 89.6  PLT 408* 344  --  299 226   Basic Metabolic Panel: Recent Labs  Lab 07/29/23 0813 07/30/23 0936 07/30/23 1724 07/30/23 2039 07/31/23 0257  NA 133* 132* 133*  --  134*  K 3.6 4.0 4.0  --  4.0  CL 95* 95*  --   --  104  CO2 23 21*  --   --  17*  GLUCOSE 129* 142*  --   --  83  BUN 21 32*  --   --  22  CREATININE 0.77 0.85  --  0.79 0.66  CALCIUM  9.3 9.5  --    --  7.5*   Liver Function Tests: Recent Labs  Lab 07/29/23 0813 07/30/23 0936 07/31/23 0257  AST 23 23 16   ALT 12 14 10   ALKPHOS 105 60 25*  BILITOT 0.5 0.6 0.7  PROT 7.0 6.1* 3.6*  ALBUMIN 3.5 2.8* 1.6*   Recent Labs  Lab 07/29/23 0813 07/30/23 0936  LIPASE <10* <10*   No results for input(s): AMMONIA in the last 168 hours. Cardiac Enzymes: No results for input(s): CKTOTAL, CKMB, CKMBINDEX, TROPONINI in the last 168 hours. BNP (last 3 results) No results for input(s): BNP in the last 8760 hours.  ProBNP (last 3 results) No results for input(s): PROBNP in the last 8760 hours.  CBG: No results for input(s): GLUCAP in the last 168 hours. Recent Results (from the past 240 hours)  Resp panel by RT-PCR (RSV, Flu A&B, Covid) Anterior Nasal Swab  Status: None   Collection Time: 07/29/23  8:26 AM   Specimen: Anterior Nasal Swab  Result Value Ref Range Status   SARS Coronavirus 2 by RT PCR NEGATIVE NEGATIVE Final    Comment: (NOTE) SARS-CoV-2 target nucleic acids are NOT DETECTED.  The SARS-CoV-2 RNA is generally detectable in upper respiratory specimens during the acute phase of infection. The lowest concentration of SARS-CoV-2 viral copies this assay can detect is 138 copies/mL. A negative result does not preclude SARS-Cov-2 infection and should not be used as the sole basis for treatment or other patient management decisions. A negative result may occur with  improper specimen collection/handling, submission of specimen other than nasopharyngeal swab, presence of viral mutation(s) within the areas targeted by this assay, and inadequate number of viral copies(<138 copies/mL). A negative result must be combined with clinical observations, patient history, and epidemiological information. The expected result is Negative.  Fact Sheet for Patients:  BloggerCourse.com  Fact Sheet for Healthcare Providers:   SeriousBroker.it  This test is no t yet approved or cleared by the [place]  FDA and  has been authorized for detection and/or diagnosis of SARS-CoV-2 by FDA under an Emergency Use Authorization (EUA). This EUA will remain  in effect (meaning this test can be used) for the duration of the COVID-19 declaration under Section 564(b)(1) of the Act, 21 U.S.C.section 360bbb-3(b)(1), unless the authorization is terminated  or revoked sooner.       Influenza A by PCR NEGATIVE NEGATIVE Final   Influenza B by PCR NEGATIVE NEGATIVE Final    Comment: (NOTE) The Xpert Xpress SARS-CoV-2/FLU/RSV plus assay is intended as an aid in the diagnosis of influenza from Nasopharyngeal swab specimens and should not be used as a sole basis for treatment. Nasal washings and aspirates are unacceptable for Xpert Xpress SARS-CoV-2/FLU/RSV testing.  Fact Sheet for Patients: BloggerCourse.com  Fact Sheet for Healthcare Providers: SeriousBroker.it  This test is not yet approved or cleared by the [place]  FDA and has been authorized for detection and/or diagnosis of SARS-CoV-2 by FDA under an Emergency Use Authorization (EUA). This EUA will remain in effect (meaning this test can be used) for the duration of the COVID-19 declaration under Section 564(b)(1) of the Act, 21 U.S.C. section 360bbb-3(b)(1), unless the authorization is terminated or revoked.     Resp Syncytial Virus by PCR NEGATIVE NEGATIVE Final    Comment: (NOTE) Fact Sheet for Patients: BloggerCourse.com  Fact Sheet for Healthcare Providers: SeriousBroker.it  This test is not yet approved or cleared by the [place]  FDA and has been authorized for detection and/or diagnosis of SARS-CoV-2 by FDA under an Emergency Use Authorization (EUA). This EUA will remain in effect (meaning this test can be used) for  the duration of the COVID-19 declaration under Section 564(b)(1) of the Act, 21 U.S.C. section 360bbb-3(b)(1), unless the authorization is terminated or revoked.  Performed at [facility], [address], [place], [place] [address]   Blood Culture (routine x 2)     Status: None (Preliminary result)   Collection Time: 07/29/23  8:29 AM   Specimen: BLOOD  Result Value Ref Range Status   Specimen Description   Final    BLOOD RIGHT ANTECUBITAL Performed at [facility], [address], [place], [place] [address]    Special Requests   Final    BOTTLES DRAWN AEROBIC AND ANAEROBIC Blood Culture adequate volume Performed at [facility], [address] [place], [place] [address]    Culture   Final  NO GROWTH 2 DAYS Performed at [facility], [address] [place], [place] [address]    Report Status PENDING  Incomplete  Blood Culture (routine x 2)     Status: None (Preliminary result)   Collection Time: 07/29/23  8:31 AM   Specimen: BLOOD RIGHT FOREARM  Result Value Ref Range Status   Specimen Description   Final    BLOOD RIGHT FOREARM Performed at [facility], [address], [place], [place] [address]    Special Requests   Final    BOTTLES DRAWN AEROBIC AND ANAEROBIC Blood Culture adequate volume Performed at [facility], [address], [place], [place] [address]    Culture   Final    NO GROWTH 2 DAYS Performed at [facility], [address] [place], [place] [address]    Report Status PENDING  Incomplete  Culture, blood (Routine X 2) w Reflex to ID Panel     Status: None (Preliminary result)   Collection Time: 07/30/23 11:00 AM   Specimen: BLOOD  Result Value Ref Range Status   Specimen Description   Final    BLOOD LEFT ANTECUBITAL Performed at [facility], [address] [place], [place] [address]    Special Requests   Final    BOTTLES DRAWN  AEROBIC AND ANAEROBIC Blood Culture results may not be optimal due to an inadequate volume of blood received in culture bottles Performed at [facility], [address] [place], [place] [address]    Culture   Final    NO GROWTH < 24 HOURS Performed at [facility], [address] [place], [place] [address]    Report Status PENDING  Incomplete  Culture, blood (Routine X 2) w Reflex to ID Panel     Status: None (Preliminary result)   Collection Time: 07/30/23 11:01 AM   Specimen: BLOOD  Result Value Ref Range Status   Specimen Description   Final    BLOOD RIGHT ANTECUBITAL Performed at [facility], [address], [place], [place] [address]    Special Requests   Final    BOTTLES DRAWN AEROBIC AND ANAEROBIC Blood Culture results may not be optimal due to an inadequate volume of blood received in culture bottles Performed at [facility], [address] [place], [place] [address]    Culture   Final    NO GROWTH < 24 HOURS Performed at [facility], [address], [place], [place] [address]    Report Status PENDING  Incomplete  MRSA Next Gen by PCR, Nasal     Status: None   Collection Time: 07/30/23  8:38 PM   Specimen: Nasal Mucosa; Nasal Swab  Result Value Ref Range Status   MRSA by PCR Next Gen NOT DETECTED NOT DETECTED Final    Comment: (NOTE) The GeneXpert MRSA Assay (FDA approved for NASAL specimens only), is one component of a comprehensive MRSA colonization surveillance program. It is not intended to diagnose MRSA infection nor to guide or monitor treatment for MRSA infections. Test performance is not FDA approved in patients less than 30 years old. Performed at [facility], [address] [place], [place] [address]      Studies: DG Chest Port 1 View Result Date: 07/30/2023 CLINICAL DATA:  Abdominal pain EXAM: PORTABLE CHEST 1 VIEW COMPARISON:  07/29/2023 FINDINGS: Heart size and  mediastinal contours are normal. Aortic atherosclerotic calcifications. Low lung volumes. No pleural fluid, interstitial edema  or airspace consolidation. The visualized osseous structures appear intact. IMPRESSION: Low lung volumes. No acute findings. Electronically Signed   By: Waddell Calk M.D.   On: 07/30/2023 10:23   CT Renal Stone Study Result Date: 07/30/2023 CLINICAL DATA:  Known left ureteral stone with worsening abdominal pain and hematuria EXAM: CT ABDOMEN AND PELVIS WITHOUT CONTRAST TECHNIQUE: Multidetector CT imaging of the abdomen and pelvis was performed following the standard protocol without IV contrast. RADIATION DOSE REDUCTION: This exam was performed according to the departmental dose-optimization program which includes automated exposure control, adjustment of the mA and/or kV according to patient size and/or use of iterative reconstruction technique. COMPARISON:  CT abdomen and pelvis dated 07/29/2023 FINDINGS: Lower chest: No focal consolidation or pulmonary nodule in the lung bases. No pleural effusion or pneumothorax demonstrated. Partially imaged heart size is normal. Coronary artery calcifications. Hepatobiliary: Unchanged segment 2 cyst. No intra or extrahepatic biliary ductal dilation. Vicariously excreted contrast within the gallbladder. Pancreas: No focal lesions or main ductal dilation. Spleen: Normal in size without focal abnormality. Adrenals/Urinary Tract: No adrenal nodules. Interval decreased left hydroureteronephrosis. Excreted contrast material within the renal collecting systems. Similar location of distal left ureteral stone measuring 9 mm (2:54). Urinary bladder is decompressed. Stomach/Bowel: Normal appearance of the stomach. Persistent large volume stool within the dilated sigmoid colon. Interval increased dilation of the stool-filled cecum. Appendix is not discretely seen. Vascular/Lymphatic: Aortic atherosclerosis. No enlarged abdominal or pelvic lymph nodes.  Reproductive: No adnexal masses. Other: Interval development of intraperitoneal free air. New moderate volume free fluid containing layering hyperdensities. Musculoskeletal: No acute or abnormal lytic or blastic osseous lesions. Multilevel degenerative changes of the partially imaged thoracic and lumbar spine. Small fat-containing paraumbilical hernia. IMPRESSION: 1. Interval development of intraperitoneal free air and moderate volume free fluid containing layering hyperdensities, consistent with bowel perforation. 2. Persistent large volume stool within the dilated sigmoid colon. Interval increased dilation of the stool-filled cecum. 3. Similar location of distal left ureteral stone measuring 9 mm with interval decreased left hydroureteronephrosis. 4.  Aortic Atherosclerosis (ICD10-I70.0). Critical Value/emergent results were called by telephone at the time of interpretation on 07/30/2023 at 10:06 am to provider SCOTT ZACKOWSKI , who verbally acknowledged these results. Electronically Signed   By: Limin  Xu M.D.   On: 07/30/2023 10:12      Vernal Alstrom, MD  [facility] 07/31/2023  If 7PM-7AM, please contact night-coverage

## 2023-08-01 ENCOUNTER — Inpatient Hospital Stay (HOSPITAL_COMMUNITY)

## 2023-08-01 DIAGNOSIS — K631 Perforation of intestine (nontraumatic): Secondary | ICD-10-CM | POA: Diagnosis not present

## 2023-08-01 LAB — BLOOD CULTURE ID PANEL (REFLEXED) - BCID2

## 2023-08-01 LAB — CBC
HCT: 27.5 % — ABNORMAL LOW (ref 36.0–46.0)
Hemoglobin: 9.1 g/dL — ABNORMAL LOW (ref 12.0–15.0)
MCH: 28.9 pg (ref 26.0–34.0)
MCHC: 33.1 g/dL (ref 30.0–36.0)
MCV: 87.3 fL (ref 80.0–100.0)
Platelets: 274 10*3/uL (ref 150–400)
RBC: 3.15 MIL/uL — ABNORMAL LOW (ref 3.87–5.11)
RDW: 13.8 % (ref 11.5–15.5)
WBC: 11 10*3/uL — ABNORMAL HIGH (ref 4.0–10.5)
nRBC: 0 % (ref 0.0–0.2)

## 2023-08-01 LAB — BASIC METABOLIC PANEL WITH GFR
Anion gap: 10 (ref 5–15)
BUN: 29 mg/dL — ABNORMAL HIGH (ref 8–23)
CO2: 24 mmol/L (ref 22–32)
Calcium: 8.4 mg/dL — ABNORMAL LOW (ref 8.9–10.3)
Chloride: 100 mmol/L (ref 98–111)
Creatinine, Ser: 0.85 mg/dL (ref 0.44–1.00)
GFR, Estimated: >60 mL/min (ref >60)
Glucose, Bld: 73 mg/dL (ref 70–99)
Potassium: 3.4 mmol/L — ABNORMAL LOW (ref 3.5–5.1)
Sodium: 134 mmol/L — ABNORMAL LOW (ref 135–145)

## 2023-08-01 LAB — SURGICAL PATHOLOGY

## 2023-08-01 MED ORDER — POTASSIUM CHLORIDE CRYS ER 20 MEQ PO TBCR
40.0000 meq | EXTENDED_RELEASE_TABLET | Freq: Once | ORAL | Status: AC
  Administered 2023-08-01: 40 meq via ORAL
  Filled 2023-08-01: qty 2

## 2023-08-01 MED ORDER — POTASSIUM CHLORIDE 10 MEQ/100ML IV SOLN
10.0000 meq | INTRAVENOUS | Status: AC
  Administered 2023-08-01 (×4): 10 meq via INTRAVENOUS
  Filled 2023-08-01 (×4): qty 100

## 2023-08-01 NOTE — Progress Notes (Signed)
 PROGRESS NOTE  Angela [name] FMW:[id number] DOB: [birth year] DOA: 07/30/2023 PCP: Catherine Charlies LABOR, DO   LOS: 2 days   Brief narrative:   Angela [name] is a 73 y.o. female with medical history significant o hypertension, hyperlipidemia, glaucoma and prior tobacco abuse who presents with abdominal pain and bloating.  She underwent a CT scan of her abdomen pelvis, which noted mild left hydroureteronephrosis to the level of a obstructing 9 mm mid ureteral stone and large volume stool burden with stercoral colitis.  Urinalysis noted small hemoglobin, 21-50 RBCs/hpf, 6-10 squamous epithelial cells/hpf, and 6-10 WBCs.   Admission was recommended due to concern for sepsis from intra-abdominal infection, but patient declined for which she was prescribed Augmentin  and discharged home. After returning home, she experienced increased abdominal pain and bloating and returned to the hospital . In the emergency department, patient was noted to be tachypneic.  Labs showed hyponatremia with sodium of 132, lactic acid 2.4.  Repeat renal CT revealed interval development of intraperitoneal free air and moderate volume free fluid containing layering hyperdensities consistent with bowel perforation, distal left ureteral stone measuring 9 mm with interval decrease left hydroureteronephrosis.  Patient was then admitted to the hospital for further evaluation and treatment.    Assessment/Plan: Principal Problem:   Bowel perforation (HCC) Active Problems:   Hydroureteronephrosis   Chronic idiopathic constipation   Metabolic acidosis with increased anion gap and accumulation of organic acids   Hyponatremia   Essential hypertension   Glaucoma   Perforated sigmoid colon (HCC)    Sepsis secondary to bowel perforation  CT of the abdomen pelvis noting bowel perforation.  Status post exploratory laparotomy with Hartmann procedure for stercoral colitis with ischemia and perforation with feculent peritonitis on 07/30/2023.    on IV Zosyn.  Will continue.  Currently n.p.o. continue IV fluids.  Plan is to continue postoperative antibiotics for 5 days.  Wound care to follow for colostomy needs.   Hydroureteronephrosis secondary to left ureteral stone   Status post explorative laparotomy.  Renal function and urine output normal.  Denies further pain.  Will likely need to be seen by urology if hydronephrosis does not improve/no spontaneous passage of stone.  Will get ultrasound of the kidneys in a.m.  Chronic idiopathic constipation Patient is a 5-month history of constipation with hemorrhoids.  CT noted large stool burden on presentation.  Currently status post surgery.  No colostomy output yet.   Metabolic acidosis with elevated anion gap Improved at this time.  Latest bicarbonate of 24.   Hyponatremia Sodium has improved to 134.   Essential hypertension Lisinopril  on hold.  Latest blood pressure of 107/56.  Will continue to monitor  Glaucoma Will resume timolol drops.  DVT prophylaxis: enoxaparin (LOVENOX) injection 40 mg Start: 07/31/23 1000 SCDs Start: 07/30/23 1954 SCDs Start: 07/30/23 1953   Disposition: Uncertain at this time.  Likely need rehabilitation.  Status is: Inpatient Remains inpatient appropriate because: Pending clinical improvement, status post surgery, n.p.o. status, likely need for rehab medicine    Code Status:     Code Status: Full Code  Family Communication: Spoke with the patient's spouse at bedside  Consultants: General Surgery  Procedures: Exploratory laparotomy with Hartmann procedure on 07/30/2023  Anti-infectives:  Zosyn IV  Anti-infectives (From admission, onward)    Start     Dose/Rate Route Frequency Ordered Stop   07/30/23 1800  piperacillin-tazobactam (ZOSYN) IVPB 3.375 g        3.375 g 12.5 mL/hr over 240 Minutes Intravenous Every  8 hours 07/30/23 1332     07/30/23 1400  piperacillin-tazobactam (ZOSYN) IVPB 3.375 g  Status:  Discontinued        3.375  g 100 mL/hr over 30 Minutes Intravenous Every 8 hours 07/30/23 1329 07/30/23 1332   07/30/23 1015  piperacillin-tazobactam (ZOSYN) IVPB 3.375 g        3.375 g 100 mL/hr over 30 Minutes Intravenous  Once 07/30/23 1013 07/30/23 1311       Subjective: Today, patient was seen and examined at bedside.  Patient complains of mild incisional pain today.  Denies any nausea or vomiting.  Has not had stool in the back.  Spouse at bedside.   Objective: Vitals:   08/01/23 1100 08/01/23 1200  BP: (!) 107/56 (!) 102/58  Pulse: 76 74  Resp: 18 16  Temp:    SpO2: 92% 92%    Intake/Output Summary (Last 24 hours) at 08/01/2023 1203 Last data filed at 08/01/2023 1200 Gross per 24 hour  Intake 1391.14 ml  Output 100 ml  Net 1291.14 ml   Filed Weights   07/30/23 1405 07/30/23 1930 07/31/23 0300  Weight: 57.2 kg 60 kg 61.8 kg   Body mass index is 23.39 kg/m.   Physical Exam: GENERAL: Patient is alert awake and oriented. Not in obvious distress.  Communicative. HENT: No scleral pallor or icterus. Pupils equally reactive to light. Oral mucosa is moist NECK: is supple, no gross swelling noted. CHEST: Diminished breath sounds bilaterally. CVS: S1 and S2 heard, no murmur. Regular rate and rhythm.  ABDOMEN: Soft,Mildly distended, appropriate tenderness noted.  Midline wound with dressing.  Colostomy in place without output.SABRA   EXTREMITIES: No edema. CNS: Cranial nerves are intact.  Generalized weakness SKIN: warm and dry without rashes.  Data Review: I have personally reviewed the following laboratory data and studies,  CBC: Recent Labs  Lab 07/29/23 0826 07/30/23 0936 07/30/23 1724 07/30/23 2039 07/31/23 0257 08/01/23 0354  WBC 12.6* 8.8  --  8.1 9.1 11.0*  NEUTROABS 10.8* 7.9*  --   --   --   --   HGB 11.6* 12.9 10.2* 10.3* 9.1* 9.1*  HCT 34.5* 37.4 30.0* 30.9* 28.4* 27.5*  MCV 84.8 84.8  --  87.0 89.6 87.3  PLT 408* 344  --  299 226 274   Basic Metabolic Panel: Recent Labs  Lab  07/29/23 0813 07/30/23 0936 07/30/23 1724 07/30/23 2039 07/31/23 0257 08/01/23 0354  NA 133* 132* 133*  --  134* 134*  K 3.6 4.0 4.0  --  4.0 3.4*  CL 95* 95*  --   --  104 100  CO2 23 21*  --   --  17* 24  GLUCOSE 129* 142*  --   --  83 73  BUN 21 32*  --   --  22 29*  CREATININE 0.77 0.85  --  0.79 0.66 0.85  CALCIUM  9.3 9.5  --   --  7.5* 8.4*   Liver Function Tests: Recent Labs  Lab 07/29/23 0813 07/30/23 0936 07/31/23 0257  AST 23 23 16   ALT 12 14 10   ALKPHOS 105 60 25*  BILITOT 0.5 0.6 0.7  PROT 7.0 6.1* 3.6*  ALBUMIN 3.5 2.8* 1.6*   Recent Labs  Lab 07/29/23 0813 07/30/23 0936  LIPASE <10* <10*   No results for input(s): AMMONIA in the last 168 hours. Cardiac Enzymes: No results for input(s): CKTOTAL, CKMB, CKMBINDEX, TROPONINI in the last 168 hours. BNP (last 3 results) No results for input(s): BNP  in the last 8760 hours.  ProBNP (last 3 results) No results for input(s): PROBNP in the last 8760 hours.  CBG: No results for input(s): GLUCAP in the last 168 hours. Recent Results (from the past 240 hours)  Resp panel by RT-PCR (RSV, Flu A&B, Covid) Anterior Nasal Swab     Status: None   Collection Time: 07/29/23  8:26 AM   Specimen: Anterior Nasal Swab  Result Value Ref Range Status   SARS Coronavirus 2 by RT PCR NEGATIVE NEGATIVE Final    Comment: (NOTE) SARS-CoV-2 target nucleic acids are NOT DETECTED.  The SARS-CoV-2 RNA is generally detectable in upper respiratory specimens during the acute phase of infection. The lowest concentration of SARS-CoV-2 viral copies this assay can detect is 138 copies/mL. A negative result does not preclude SARS-Cov-2 infection and should not be used as the sole basis for treatment or other patient management decisions. A negative result may occur with  improper specimen collection/handling, submission of specimen other than nasopharyngeal swab, presence of viral mutation(s) within the areas targeted  by this assay, and inadequate number of viral copies(<138 copies/mL). A negative result must be combined with clinical observations, patient history, and epidemiological information. The expected result is Negative.  Fact Sheet for Patients:  BloggerCourse.com  Fact Sheet for Healthcare Providers:  SeriousBroker.it  This test is no t yet approved or cleared by the [place]  FDA and  has been authorized for detection and/or diagnosis of SARS-CoV-2 by FDA under an Emergency Use Authorization (EUA). This EUA will remain  in effect (meaning this test can be used) for the duration of the COVID-19 declaration under Section 564(b)(1) of the Act, 21 U.S.C.section 360bbb-3(b)(1), unless the authorization is terminated  or revoked sooner.       Influenza A by PCR NEGATIVE NEGATIVE Final   Influenza B by PCR NEGATIVE NEGATIVE Final    Comment: (NOTE) The Xpert Xpress SARS-CoV-2/FLU/RSV plus assay is intended as an aid in the diagnosis of influenza from Nasopharyngeal swab specimens and should not be used as a sole basis for treatment. Nasal washings and aspirates are unacceptable for Xpert Xpress SARS-CoV-2/FLU/RSV testing.  Fact Sheet for Patients: BloggerCourse.com  Fact Sheet for Healthcare Providers: SeriousBroker.it  This test is not yet approved or cleared by the [place]  FDA and has been authorized for detection and/or diagnosis of SARS-CoV-2 by FDA under an Emergency Use Authorization (EUA). This EUA will remain in effect (meaning this test can be used) for the duration of the COVID-19 declaration under Section 564(b)(1) of the Act, 21 U.S.C. section 360bbb-3(b)(1), unless the authorization is terminated or revoked.     Resp Syncytial Virus by PCR NEGATIVE NEGATIVE Final    Comment: (NOTE) Fact Sheet for Patients: BloggerCourse.com  Fact  Sheet for Healthcare Providers: SeriousBroker.it  This test is not yet approved or cleared by the [place]  FDA and has been authorized for detection and/or diagnosis of SARS-CoV-2 by FDA under an Emergency Use Authorization (EUA). This EUA will remain in effect (meaning this test can be used) for the duration of the COVID-19 declaration under Section 564(b)(1) of the Act, 21 U.S.C. section 360bbb-3(b)(1), unless the authorization is terminated or revoked.  Performed at [facility], [address], [place], [place] [address]   Blood Culture (routine x 2)     Status: None (Preliminary result)   Collection Time: 07/29/23  8:29 AM   Specimen: BLOOD  Result Value Ref Range Status   Specimen Description   Final  BLOOD RIGHT ANTECUBITAL Performed at [facility], [address] [place], [place] [address]    Special Requests   Final    BOTTLES DRAWN AEROBIC AND ANAEROBIC Blood Culture adequate volume Performed at [facility], [address], [place], [place] [address]    Culture  Setup Time   Final    GRAM POSITIVE COCCI IN CHAINS ANAEROBIC BOTTLE ONLY Organism ID to follow Performed at [facility], [address], [place], [place] [address]    Culture GRAM POSITIVE COCCI  Final   Report Status PENDING  Incomplete  Blood Culture (routine x 2)     Status: None (Preliminary result)   Collection Time: 07/29/23  8:31 AM   Specimen: BLOOD RIGHT FOREARM  Result Value Ref Range Status   Specimen Description   Final    BLOOD RIGHT FOREARM Performed at [facility], [address] [place], [place] [address]    Special Requests   Final    BOTTLES DRAWN AEROBIC AND ANAEROBIC Blood Culture adequate volume Performed at [facility], [address] [place], [place] [address]    Culture   Final    NO GROWTH 3 DAYS Performed at [facility], [address], [place], [place] [address]    Report Status PENDING  Incomplete  Urine Culture     Status: None   Collection Time: 07/30/23  9:36 AM   Specimen: Urine, Clean Catch  Result Value Ref Range Status   Specimen Description   Final    URINE, CLEAN CATCH Performed at [facility], [address], [place], [place] [address]    Special Requests   Final    NONE Performed at [facility], [address] [place], [place] [address]    Culture   Final    NO GROWTH Performed at [facility], [address], [place], [place] [address]    Report Status 07/31/2023 FINAL  Final  Culture, blood (Routine X 2) w Reflex to ID Panel     Status: None (Preliminary result)   Collection Time: 07/30/23 11:00 AM   Specimen: BLOOD  Result Value Ref Range Status   Specimen Description   Final    BLOOD LEFT ANTECUBITAL Performed at [facility], [address] [place], [place] [address]    Special Requests   Final    BOTTLES DRAWN AEROBIC AND ANAEROBIC Blood Culture results may not be optimal due to an inadequate volume of blood received in culture bottles Performed at [facility], [address] [place], [place] [address]    Culture   Final    NO GROWTH 2 DAYS Performed at [facility], [address] [place], [place] [address]    Report Status PENDING  Incomplete  Culture, blood (Routine X 2) w Reflex to ID Panel     Status: None (Preliminary result)   Collection Time: 07/30/23 11:01 AM   Specimen: BLOOD  Result Value Ref Range Status   Specimen Description   Final    BLOOD RIGHT ANTECUBITAL Performed at [facility], [address], [place], [place] [address]    Special Requests   Final    BOTTLES DRAWN AEROBIC AND ANAEROBIC Blood Culture results may not be optimal due to an inadequate volume of blood received in culture bottles Performed at [facility], [address] [place], [place] [address]    Culture   Final  NO GROWTH 2 DAYS Performed at [facility], [address] [place], [place] [address]    Report Status PENDING  Incomplete  MRSA Next Gen by PCR, Nasal     Status: None   Collection Time: 07/30/23  8:38 PM   Specimen: Nasal Mucosa; Nasal Swab  Result Value Ref Range Status   MRSA by PCR Next Gen NOT DETECTED NOT DETECTED Final    Comment: (NOTE) The GeneXpert MRSA Assay (FDA approved for NASAL specimens only), is one component of a comprehensive MRSA colonization surveillance program. It is not intended to diagnose MRSA infection nor to guide or monitor treatment for MRSA infections. Test performance is not FDA approved in patients less than 98 years old. Performed at [facility], [address] [place], [place] [address]      Studies: No results found.     Odell Fasching, MD  [facility] 08/01/2023  If 7PM-7AM, please contact night-coverage

## 2023-08-01 NOTE — Consult Note (Signed)
 WOC team received secure chat requesting re-evaluation of stoma due to swelling/blistering.  WOC team presented to bedside, viewed stoma through ostomy appliance which is in place, clean and intact.  No stool production noted.  Stoma remains the same as seen on 6/23, stoma does have swelling, is noted to be pink and moist.  WOC team plans to follow patient for continued teaching with plans for a scheduled sessions with spouse on Thursday 6/25.  Doyal Polite, RN, MSN, [facility] WOC Team

## 2023-08-01 NOTE — Progress Notes (Signed)
 PHARMACY - PHYSICIAN COMMUNICATION CRITICAL VALUE ALERT - BLOOD CULTURE IDENTIFICATION (BCID)  Angela [name] is an 73 y.o. female who presented to [facility] on 07/30/2023 with a chief complaint of abdominal pain and shortness of breath. Found to have a perforated sigmoid colon underwent repair on 6/22.Patient with mild WBC elevation to 11.0 and has been afebrile. BP has been stable. No longterm lines or pacemaker.   Assessment:  1/8 bottles GPC in chains from [facility] on 6/21 unable to be picked up by BCID, 6/22 BCX NGTD x 2d   Name of physician (or Provider) Contacted: Dr. Sonjia   Current antibiotics: Zosyn for IAI coverage   Changes to prescribed antibiotics recommended:  No changes, suspect contamination.   Results for orders placed or performed during the hospital encounter of 07/29/23  Blood Culture ID Panel (Reflexed) (Collected: 07/29/2023  8:29 AM)  Result Value Ref Range   Enterococcus faecalis NOT DETECTED NOT DETECTED   Enterococcus Faecium NOT DETECTED NOT DETECTED   Listeria monocytogenes NOT DETECTED NOT DETECTED   Staphylococcus species NOT DETECTED NOT DETECTED   Staphylococcus aureus (BCID) NOT DETECTED NOT DETECTED   Staphylococcus epidermidis NOT DETECTED NOT DETECTED   Staphylococcus lugdunensis NOT DETECTED NOT DETECTED   Streptococcus species NOT DETECTED NOT DETECTED   Streptococcus agalactiae NOT DETECTED NOT DETECTED   Streptococcus pneumoniae NOT DETECTED NOT DETECTED   Streptococcus pyogenes NOT DETECTED NOT DETECTED   A.calcoaceticus-baumannii NOT DETECTED NOT DETECTED   Bacteroides fragilis NOT DETECTED NOT DETECTED   Enterobacterales NOT DETECTED NOT DETECTED   Enterobacter cloacae complex NOT DETECTED NOT DETECTED   Escherichia coli NOT DETECTED NOT DETECTED   Klebsiella aerogenes NOT DETECTED NOT DETECTED   Klebsiella oxytoca NOT DETECTED NOT DETECTED   Klebsiella pneumoniae NOT DETECTED NOT DETECTED   Proteus species NOT DETECTED NOT DETECTED    Salmonella species NOT DETECTED NOT DETECTED   Serratia marcescens NOT DETECTED NOT DETECTED   Haemophilus influenzae NOT DETECTED NOT DETECTED   Neisseria meningitidis NOT DETECTED NOT DETECTED   Pseudomonas aeruginosa NOT DETECTED NOT DETECTED   Stenotrophomonas maltophilia NOT DETECTED NOT DETECTED   Candida albicans NOT DETECTED NOT DETECTED   Candida auris NOT DETECTED NOT DETECTED   Candida glabrata NOT DETECTED NOT DETECTED   Candida krusei NOT DETECTED NOT DETECTED   Candida parapsilosis NOT DETECTED NOT DETECTED   Candida tropicalis NOT DETECTED NOT DETECTED   Cryptococcus neoformans/gattii NOT DETECTED NOT DETECTED    Powell Blush, PharmD, BCCCP  08/01/2023  3:17 PM

## 2023-08-01 NOTE — Plan of Care (Signed)
  Problem: Education: Goal: Knowledge of General Education information will improve Description: Including pain rating scale, medication(s)/side effects and non-pharmacologic comfort measures Outcome: Progressing   Problem: Health Behavior/Discharge Planning: Goal: Ability to manage health-related needs will improve Outcome: Progressing   Problem: Clinical Measurements: Goal: Ability to maintain clinical measurements within normal limits will improve Outcome: Progressing Goal: Will remain free from infection Outcome: Progressing Goal: Diagnostic test results will improve Outcome: Progressing Goal: Respiratory complications will improve Outcome: Progressing Goal: Cardiovascular complication will be avoided Outcome: Progressing   Problem: Activity: Goal: Risk for activity intolerance will decrease Outcome: Progressing   Problem: Nutrition: Goal: Adequate nutrition will be maintained Outcome: Progressing   Problem: Coping: Goal: Level of anxiety will decrease Outcome: Progressing   Problem: Elimination: Goal: Will not experience complications related to urinary retention Outcome: Progressing   Problem: Pain Managment: Goal: General experience of comfort will improve and/or be controlled Outcome: Progressing   Problem: Safety: Goal: Ability to remain free from injury will improve Outcome: Progressing   Problem: Skin Integrity: Goal: Risk for impaired skin integrity will decrease Outcome: Progressing

## 2023-08-01 NOTE — Progress Notes (Signed)
 2 Days Post-Op  Subjective: Patient c/o not feeling well this morning mostly secondary to potassium burning in her IV right now.  Still feels bloated, but denies nausea.  Colostomy with some air present.  Concerned about its appearance.  Objective: Vital signs in last 24 hours: Temp:  [98 F (36.7 C)-98.8 F (37.1 C)] 98.2 F (36.8 C) (06/24 0809) Pulse Rate:  [72-115] 80 (06/24 0700) Resp:  [9-37] 21 (06/24 0700) BP: (91-136)/(55-72) 136/72 (06/24 0600) SpO2:  [90 %-97 %] 95 % (06/24 0700) Arterial Line BP: (93-153)/(44-77) 131/60 (06/24 0700) Last BM Date :  (PTA)  Intake/Output from previous day: 06/23 0701 - 06/24 0700 In: 1800.7 [I.V.:1650.6; IV Piggyback:150.1] Out: 350 [Urine:350] Intake/Output this shift: No intake/output data recorded.  PE: Gen: NAD, sitting up in her chair Heart: regular Lungs: effort nonlabored  Abd: soft, but with distention, colostomy viable and a bit edematous, but looks well. No output.  Midline wound is clean and packed.     Lab Results:  Recent Labs    07/31/23 0257 08/01/23 0354  WBC 9.1 11.0*  HGB 9.1* 9.1*  HCT 28.4* 27.5*  PLT 226 274   BMET Recent Labs    07/31/23 0257 08/01/23 0354  NA 134* 134*  K 4.0 3.4*  CL 104 100  CO2 17* 24  GLUCOSE 83 73  BUN 22 29*  CREATININE 0.66 0.85  CALCIUM  7.5* 8.4*   PT/INR No results for input(s): LABPROT, INR in the last 72 hours.  CMP     Component Value Date/Time   NA 134 (L) 08/01/2023 0354   NA 140 11/10/2007 0000   K 3.4 (L) 08/01/2023 0354   CL 100 08/01/2023 0354   CO2 24 08/01/2023 0354   GLUCOSE 73 08/01/2023 0354   BUN 29 (H) 08/01/2023 0354   BUN 10 11/10/2007 0000   CREATININE 0.85 08/01/2023 0354   CREATININE 0.74 06/26/2020 1522   CALCIUM  8.4 (L) 08/01/2023 0354   PROT 3.6 (L) 07/31/2023 0257   ALBUMIN 1.6 (L) 07/31/2023 0257   AST 16 07/31/2023 0257   ALT 10 07/31/2023 0257   ALKPHOS 25 (L) 07/31/2023 0257   BILITOT 0.7 07/31/2023 0257    GFRNONAA >60 08/01/2023 0354   Lipase     Component Value Date/Time   LIPASE <10 (L) 07/30/2023 0936       Studies/Results: DG Chest Port 1 View Result Date: 07/30/2023 CLINICAL DATA:  Abdominal pain EXAM: PORTABLE CHEST 1 VIEW COMPARISON:  07/29/2023 FINDINGS: Heart size and mediastinal contours are normal. Aortic atherosclerotic calcifications. Low lung volumes. No pleural fluid, interstitial edema or airspace consolidation. The visualized osseous structures appear intact. IMPRESSION: Low lung volumes. No acute findings. Electronically Signed   By: Waddell Calk M.D.   On: 07/30/2023 10:23    Anti-infectives: Anti-infectives (From admission, onward)    Start     Dose/Rate Route Frequency Ordered Stop   07/30/23 1800  piperacillin-tazobactam (ZOSYN) IVPB 3.375 g        3.375 g 12.5 mL/hr over 240 Minutes Intravenous Every 8 hours 07/30/23 1332     07/30/23 1400  piperacillin-tazobactam (ZOSYN) IVPB 3.375 g  Status:  Discontinued        3.375 g 100 mL/hr over 30 Minutes Intravenous Every 8 hours 07/30/23 1329 07/30/23 1332   07/30/23 1015  piperacillin-tazobactam (ZOSYN) IVPB 3.375 g        3.375 g 100 mL/hr over 30 Minutes Intravenous  Once 07/30/23 1013 07/30/23 1311  Assessment/Plan POD 2, s/p ex lap with Hartmann's procedure for stercoral colitis with ischemia and perforation with feculent peritonitis, Dr. Polly 6/22 -NPO x chips and sips with meds -await return of bowel function -mobilize -spontaneously voiding -continue abx for at least 5 days post op -BID dressing changes to midline wound -WOC following -pulm toilet  -stable to tx to progressive floor from surgery standpoint.  If do so, can DC a line from our standpoint as well -d/w patient with WOC, RN, and primary service  FEN - NPO X ice chips and sips with meds, K being replaced by primary service VTE - lovenox ID - zosyn x 5 days post op  Nephrolithiasis - per medicine Chronic constipation -  will need aggressive bowel regimen when bowel function returns HTN GLaucoma Anemia - hgb 9.1 this am    LOS: 2 days    Burnard FORBES Banter , [facility] Surgery 08/01/2023, 10:10 AM Please see Amion for pager number during day hours 7:00am-4:30pm or 7:00am -11:30am on weekends

## 2023-08-02 DIAGNOSIS — K631 Perforation of intestine (nontraumatic): Secondary | ICD-10-CM | POA: Diagnosis not present

## 2023-08-02 LAB — BASIC METABOLIC PANEL WITH GFR
Anion gap: 11 (ref 5–15)
BUN: 34 mg/dL — ABNORMAL HIGH (ref 8–23)
CO2: 20 mmol/L — ABNORMAL LOW (ref 22–32)
Calcium: 8.1 mg/dL — ABNORMAL LOW (ref 8.9–10.3)
Chloride: 103 mmol/L (ref 98–111)
Creatinine, Ser: 0.8 mg/dL (ref 0.44–1.00)
GFR, Estimated: >60 mL/min (ref >60)
Glucose, Bld: 72 mg/dL (ref 70–99)
Potassium: 3.9 mmol/L (ref 3.5–5.1)
Sodium: 134 mmol/L — ABNORMAL LOW (ref 135–145)

## 2023-08-02 LAB — CBC
HCT: 30.9 % — ABNORMAL LOW (ref 36.0–46.0)
Hemoglobin: 10.2 g/dL — ABNORMAL LOW (ref 12.0–15.0)
MCH: 28.5 pg (ref 26.0–34.0)
MCHC: 33 g/dL (ref 30.0–36.0)
MCV: 86.3 fL (ref 80.0–100.0)
Platelets: 325 10*3/uL (ref 150–400)
RBC: 3.58 MIL/uL — ABNORMAL LOW (ref 3.87–5.11)
RDW: 13.9 % (ref 11.5–15.5)
WBC: 14.1 10*3/uL — ABNORMAL HIGH (ref 4.0–10.5)
nRBC: 0 % (ref 0.0–0.2)

## 2023-08-02 LAB — PHOSPHORUS: Phosphorus: 2.7 mg/dL (ref 2.5–4.6)

## 2023-08-02 LAB — MAGNESIUM: Magnesium: 1.8 mg/dL (ref 1.7–2.4)

## 2023-08-02 NOTE — Progress Notes (Signed)
 PROGRESS NOTE  Angela [name] FMW:[id number] DOB: December 30, 1950 DOA: 07/30/2023 PCP: Catherine Charlies LABOR, DO   LOS: 3 days   Brief narrative:   Angela [name] is a 73 y.o. female with medical history significant o hypertension, hyperlipidemia, glaucoma and prior tobacco abuse who presents with abdominal pain and bloating.  She underwent a CT scan of her abdomen pelvis, which noted mild left hydroureteronephrosis to the level of a obstructing 9 mm mid ureteral stone and large volume stool burden with stercoral colitis.  Urinalysis noted small hemoglobin, 21-50 RBCs/hpf, 6-10 squamous epithelial cells/hpf, and 6-10 WBCs.   Admission was recommended due to concern for sepsis from intra-abdominal infection, but patient declined for which she was prescribed Augmentin  and discharged home. After returning home, she experienced increased abdominal pain and bloating and returned to the hospital . In the emergency department, patient was noted to be tachypneic.  Labs showed hyponatremia with sodium of 132, lactic acid 2.4.  Repeat renal CT revealed interval development of intraperitoneal free air and moderate volume free fluid containing layering hyperdensities consistent with bowel perforation, distal left ureteral stone measuring 9 mm with interval decrease left hydroureteronephrosis.  Patient was then admitted to the hospital for further evaluation and treatment.    Assessment/Plan: Principal Problem:   Bowel perforation (HCC) Active Problems:   Hydroureteronephrosis   Chronic idiopathic constipation   Metabolic acidosis with increased anion gap and accumulation of organic acids   Hyponatremia   Essential hypertension   Glaucoma   Perforated sigmoid colon (HCC)    Sepsis secondary to bowel perforation  -CT of the abdomen pelvis noting bowel perforation.  Status post exploratory laparotomy with Hartmann procedure for stercoral colitis with ischemia and perforation with feculent peritonitis on 07/30/2023.   -  on IV Zosyn.,  Continue at least for 5 days postoperatively, continue to monitor closely as white blood cell count is up to 14 today . - Appears to have been started on clear liquid diet today by general surgery . - She was encouraged to use incentive spirometer.  . -Wound care to follow for colostomy needs.   Hydroureteronephrosis secondary to left ureteral stone   Status post explorative laparotomy.  Renal function and urine output normal.  Denies further pain. - Does appear to be having left kidney stone in distal ureter, but serial imaging including follow-up from for CT abdomen pelvis to CT renal protocol showing improvement, and renal ultrasound obtained 6/24 showing no further hydronephrosis.   Chronic idiopathic constipation Patient is a 70-month history of constipation with hemorrhoids.  CT noted large stool burden on presentation.  Currently status post surgery.  No colostomy output yet.   Metabolic acidosis with elevated anion gap Improved at this time.  Latest bicarbonate of 24.   Hyponatremia Sodium has improved to 134.   Essential hypertension Lisinopril  on hold.  Latest blood pressure of 107/56.  Will continue to monitor  Glaucoma Will resume timolol drops.  DVT prophylaxis: enoxaparin (LOVENOX) injection 40 mg Start: 07/31/23 1000 SCDs Start: 07/30/23 1954 SCDs Start: 07/30/23 1953   Disposition: Uncertain at this time.  Likely need rehabilitation.  Status is: Inpatient Remains inpatient appropriate because: Pending clinical improvement, status post surgery, n.p.o. status, likely need for rehab medicine    Code Status:     Code Status: Full Code  Family Communication: Spoke with the patient's spouse and daughter at bedside  Consultants: General Surgery  Procedures: Exploratory laparotomy with Hartmann procedure on 07/30/2023  Anti-infectives:  Zosyn IV  Anti-infectives (  From admission, onward)    Start     Dose/Rate Route Frequency Ordered Stop    07/30/23 1800  piperacillin-tazobactam (ZOSYN) IVPB 3.375 g        3.375 g 12.5 mL/hr over 240 Minutes Intravenous Every 8 hours 07/30/23 1332     07/30/23 1400  piperacillin-tazobactam (ZOSYN) IVPB 3.375 g  Status:  Discontinued        3.375 g 100 mL/hr over 30 Minutes Intravenous Every 8 hours 07/30/23 1329 07/30/23 1332   07/30/23 1015  piperacillin-tazobactam (ZOSYN) IVPB 3.375 g        3.375 g 100 mL/hr over 30 Minutes Intravenous  Once 07/30/23 1013 07/30/23 1311       Subjective:  Patient complains of post expected abdominal pain, no nausea, no vomiting, no stool in the bag yet, but reports some air.   Objective: Vitals:   08/02/23 0750 08/02/23 1225  BP: 116/66 107/66  Pulse: 86 79  Resp: 20 19  Temp: 97.8 F (36.6 C) 97.7 F (36.5 C)  SpO2: 90%     Intake/Output Summary (Last 24 hours) at 08/02/2023 1443 Last data filed at 08/02/2023 0945 Gross per 24 hour  Intake 1972.39 ml  Output 30 ml  Net 1942.39 ml   Filed Weights   07/30/23 1405 07/30/23 1930 07/31/23 0300  Weight: 57.2 kg 60 kg 61.8 kg   Body mass index is 23.39 kg/m.   Physical Exam:  Awake Alert, Oriented X 3,  Symmetrical Chest wall movement, diminished air entry at the bases RRR,No Gallops, Midline surgical wound bandaged, colostomy present, no output yet No Cyanosis, Clubbing or edema, No new Rash or bruise      Data Review: I have personally reviewed the following laboratory data and studies,  CBC: Recent Labs  Lab 07/29/23 0826 07/30/23 0936 07/30/23 1724 07/30/23 2039 07/31/23 0257 08/01/23 0354 08/02/23 0412  WBC 12.6* 8.8  --  8.1 9.1 11.0* 14.1*  NEUTROABS 10.8* 7.9*  --   --   --   --   --   HGB 11.6* 12.9 10.2* 10.3* 9.1* 9.1* 10.2*  HCT 34.5* 37.4 30.0* 30.9* 28.4* 27.5* 30.9*  MCV 84.8 84.8  --  87.0 89.6 87.3 86.3  PLT 408* 344  --  299 226 274 325   Basic Metabolic Panel: Recent Labs  Lab 07/29/23 0813 07/30/23 0936 07/30/23 1724 07/30/23 2039  07/31/23 0257 08/01/23 0354 08/02/23 0412  NA 133* 132* 133*  --  134* 134* 134*  K 3.6 4.0 4.0  --  4.0 3.4* 3.9  CL 95* 95*  --   --  104 100 103  CO2 23 21*  --   --  17* 24 20*  GLUCOSE 129* 142*  --   --  83 73 72  BUN 21 32*  --   --  22 29* 34*  CREATININE 0.77 0.85  --  0.79 0.66 0.85 0.80  CALCIUM  9.3 9.5  --   --  7.5* 8.4* 8.1*  MG  --   --   --   --   --   --  1.8  PHOS  --   --   --   --   --   --  2.7   Liver Function Tests: Recent Labs  Lab 07/29/23 0813 07/30/23 0936 07/31/23 0257  AST 23 23 16   ALT 12 14 10   ALKPHOS 105 60 25*  BILITOT 0.5 0.6 0.7  PROT 7.0 6.1* 3.6*  ALBUMIN 3.5 2.8* 1.6*  Recent Labs  Lab 07/29/23 0813 07/30/23 0936  LIPASE <10* <10*   No results for input(s): AMMONIA in the last 168 hours. Cardiac Enzymes: No results for input(s): CKTOTAL, CKMB, CKMBINDEX, TROPONINI in the last 168 hours. BNP (last 3 results) No results for input(s): BNP in the last 8760 hours.  ProBNP (last 3 results) No results for input(s): PROBNP in the last 8760 hours.  CBG: No results for input(s): GLUCAP in the last 168 hours. Recent Results (from the past 240 hours)  Resp panel by RT-PCR (RSV, Flu A&B, Covid) Anterior Nasal Swab     Status: None   Collection Time: 07/29/23  8:26 AM   Specimen: Anterior Nasal Swab  Result Value Ref Range Status   SARS Coronavirus 2 by RT PCR NEGATIVE NEGATIVE Final    Comment: (NOTE) SARS-CoV-2 target nucleic acids are NOT DETECTED.  The SARS-CoV-2 RNA is generally detectable in upper respiratory specimens during the acute phase of infection. The lowest concentration of SARS-CoV-2 viral copies this assay can detect is 138 copies/mL. A negative result does not preclude SARS-Cov-2 infection and should not be used as the sole basis for treatment or other patient management decisions. A negative result may occur with  improper specimen collection/handling, submission of specimen other than  nasopharyngeal swab, presence of viral mutation(s) within the areas targeted by this assay, and inadequate number of viral copies(<138 copies/mL). A negative result must be combined with clinical observations, patient history, and epidemiological information. The expected result is Negative.  Fact Sheet for Patients:  BloggerCourse.com  Fact Sheet for Healthcare Providers:  SeriousBroker.it  This test is no t yet approved or cleared by the [place]  FDA and  has been authorized for detection and/or diagnosis of SARS-CoV-2 by FDA under an Emergency Use Authorization (EUA). This EUA will remain  in effect (meaning this test can be used) for the duration of the COVID-19 declaration under Section 564(b)(1) of the Act, 21 U.S.C.section 360bbb-3(b)(1), unless the authorization is terminated  or revoked sooner.       Influenza A by PCR NEGATIVE NEGATIVE Final   Influenza B by PCR NEGATIVE NEGATIVE Final    Comment: (NOTE) The Xpert Xpress SARS-CoV-2/FLU/RSV plus assay is intended as an aid in the diagnosis of influenza from Nasopharyngeal swab specimens and should not be used as a sole basis for treatment. Nasal washings and aspirates are unacceptable for Xpert Xpress SARS-CoV-2/FLU/RSV testing.  Fact Sheet for Patients: BloggerCourse.com  Fact Sheet for Healthcare Providers: SeriousBroker.it  This test is not yet approved or cleared by the [place]  FDA and has been authorized for detection and/or diagnosis of SARS-CoV-2 by FDA under an Emergency Use Authorization (EUA). This EUA will remain in effect (meaning this test can be used) for the duration of the COVID-19 declaration under Section 564(b)(1) of the Act, 21 U.S.C. section 360bbb-3(b)(1), unless the authorization is terminated or revoked.     Resp Syncytial Virus by PCR NEGATIVE NEGATIVE Final    Comment:  (NOTE) Fact Sheet for Patients: BloggerCourse.com  Fact Sheet for Healthcare Providers: SeriousBroker.it  This test is not yet approved or cleared by the [place]  FDA and has been authorized for detection and/or diagnosis of SARS-CoV-2 by FDA under an Emergency Use Authorization (EUA). This EUA will remain in effect (meaning this test can be used) for the duration of the COVID-19 declaration under Section 564(b)(1) of the Act, 21 U.S.C. section 360bbb-3(b)(1), unless the authorization is terminated or revoked.  Performed at [facility],  [address] [place], [place] [address]   Blood Culture (routine x 2)     Status: None (Preliminary result)   Collection Time: 07/29/23  8:29 AM   Specimen: BLOOD  Result Value Ref Range Status   Specimen Description   Final    BLOOD RIGHT ANTECUBITAL Performed at [facility], [address], [place], [place] [address]    Special Requests   Final    BOTTLES DRAWN AEROBIC AND ANAEROBIC Blood Culture adequate volume Performed at [facility], [address] [place], [place] [address]    Culture  Setup Time   Final    GRAM POSITIVE COCCI IN CHAINS ANAEROBIC BOTTLE ONLY CRITICAL RESULT CALLED TO, READ BACK BY AND VERIFIED WITH: PHARMD HEATHER WILSON ON 08/01/23 @ 1250 BY DRT Performed at [facility], [address], [place], [place] [address]    Culture GRAM POSITIVE COCCI  Final   Report Status PENDING  Incomplete  Blood Culture ID Panel (Reflexed)     Status: None   Collection Time: 07/29/23  8:29 AM  Result Value Ref Range Status   Enterococcus faecalis NOT DETECTED NOT DETECTED Final   Enterococcus Faecium NOT DETECTED NOT DETECTED Final   Listeria monocytogenes NOT DETECTED NOT DETECTED Final   Staphylococcus species NOT DETECTED NOT DETECTED Final   Staphylococcus aureus (BCID) NOT DETECTED NOT DETECTED Final    Staphylococcus epidermidis NOT DETECTED NOT DETECTED Final   Staphylococcus lugdunensis NOT DETECTED NOT DETECTED Final   Streptococcus species NOT DETECTED NOT DETECTED Final   Streptococcus agalactiae NOT DETECTED NOT DETECTED Final   Streptococcus pneumoniae NOT DETECTED NOT DETECTED Final   Streptococcus pyogenes NOT DETECTED NOT DETECTED Final   A.calcoaceticus-baumannii NOT DETECTED NOT DETECTED Final   Bacteroides fragilis NOT DETECTED NOT DETECTED Final   Enterobacterales NOT DETECTED NOT DETECTED Final   Enterobacter cloacae complex NOT DETECTED NOT DETECTED Final   Escherichia coli NOT DETECTED NOT DETECTED Final   Klebsiella aerogenes NOT DETECTED NOT DETECTED Final   Klebsiella oxytoca NOT DETECTED NOT DETECTED Final   Klebsiella pneumoniae NOT DETECTED NOT DETECTED Final   Proteus species NOT DETECTED NOT DETECTED Final   Salmonella species NOT DETECTED NOT DETECTED Final   Serratia marcescens NOT DETECTED NOT DETECTED Final   Haemophilus influenzae NOT DETECTED NOT DETECTED Final   Neisseria meningitidis NOT DETECTED NOT DETECTED Final   Pseudomonas aeruginosa NOT DETECTED NOT DETECTED Final   Stenotrophomonas maltophilia NOT DETECTED NOT DETECTED Final   Candida albicans NOT DETECTED NOT DETECTED Final   Candida auris NOT DETECTED NOT DETECTED Final   Candida glabrata NOT DETECTED NOT DETECTED Final   Candida krusei NOT DETECTED NOT DETECTED Final   Candida parapsilosis NOT DETECTED NOT DETECTED Final   Candida tropicalis NOT DETECTED NOT DETECTED Final   Cryptococcus neoformans/gattii NOT DETECTED NOT DETECTED Final    Comment: Performed at [facility], [address] [place], [place] [address]  Blood Culture (routine x 2)     Status: None (Preliminary result)   Collection Time: 07/29/23  8:31 AM   Specimen: BLOOD RIGHT FOREARM  Result Value Ref Range Status   Specimen Description   Final    BLOOD RIGHT FOREARM Performed at [facility], [address] [place], [place] [address]    Special Requests   Final    BOTTLES DRAWN AEROBIC AND ANAEROBIC Blood Culture adequate volume Performed at [facility], [address], [place], [place] [address]  Culture   Final    NO GROWTH 4 DAYS Performed at [facility], [address], [place], [place] [address]    Report Status PENDING  Incomplete  Urine Culture     Status: None   Collection Time: 07/30/23  9:36 AM   Specimen: Urine, Clean Catch  Result Value Ref Range Status   Specimen Description   Final    URINE, CLEAN CATCH Performed at [facility], [address], [place], [place] [address]    Special Requests   Final    NONE Performed at [facility], [address], [place], [place] [address]    Culture   Final    NO GROWTH Performed at [facility], [address] [place], [place] [address]    Report Status 07/31/2023 FINAL  Final  Culture, blood (Routine X 2) w Reflex to ID Panel     Status: None (Preliminary result)   Collection Time: 07/30/23 11:00 AM   Specimen: BLOOD  Result Value Ref Range Status   Specimen Description   Final    BLOOD LEFT ANTECUBITAL Performed at [facility], [address], [place], [place] [address]    Special Requests   Final    BOTTLES DRAWN AEROBIC AND ANAEROBIC Blood Culture results may not be optimal due to an inadequate volume of blood received in culture bottles Performed at [facility], [address], [place], [place] [address]    Culture   Final    NO GROWTH 3 DAYS Performed at [facility], [address] [place], [place] [address]    Report Status PENDING  Incomplete  Culture, blood (Routine X 2) w Reflex to ID Panel     Status: None (Preliminary result)   Collection Time: 07/30/23 11:01 AM   Specimen: BLOOD  Result Value Ref Range Status   Specimen Description   Final    BLOOD RIGHT ANTECUBITAL Performed  at [facility], [address] [place], [place] [address]    Special Requests   Final    BOTTLES DRAWN AEROBIC AND ANAEROBIC Blood Culture results may not be optimal due to an inadequate volume of blood received in culture bottles Performed at [facility], [address], [place], [place] [address]    Culture   Final    NO GROWTH 3 DAYS Performed at [facility], [address] [place], [place] [address]    Report Status PENDING  Incomplete  MRSA Next Gen by PCR, Nasal     Status: None   Collection Time: 07/30/23  8:38 PM   Specimen: Nasal Mucosa; Nasal Swab  Result Value Ref Range Status   MRSA by PCR Next Gen NOT DETECTED NOT DETECTED Final    Comment: (NOTE) The GeneXpert MRSA Assay (FDA approved for NASAL specimens only), is one component of a comprehensive MRSA colonization surveillance program. It is not intended to diagnose MRSA infection nor to guide or monitor treatment for MRSA infections. Test performance is not FDA approved in patients less than 105 years old. Performed at [facility], [address], [place], [place] [address]      Studies: US  RENAL Result Date: 08/01/2023 CLINICAL DATA:  Hydronephrosis EXAM: RENAL / URINARY TRACT ULTRASOUND COMPLETE COMPARISON:  CT abdomen pelvis 07/30/2023 FINDINGS: Right Kidney: Renal measurements: 11.4 x 4.8 x 4.6 cm = volume: 131 mL. Echogenicity within normal limits. No mass or hydronephrosis visualized. Left Kidney: Renal measurements: 12.2  x 5.0 x 4.7 cm = volume: 152 mL. Echogenicity within normal limits. No mass or hydronephrosis visualized. Bladder: Visualized due to overlying bandages. Other: None. IMPRESSION: No hydronephrosis. Electronically Signed   By: Norman Gatlin M.D.   On: 08/01/2023 22:19       Brayton Lye, MD  [facility] 08/02/2023  If 7PM-7AM, please contact night-coverage

## 2023-08-02 NOTE — Care Management Important Message (Signed)
 Important Message  Patient Details  Name: Angela [name] MRN: [id number] Date of Birth: 1950/08/14   Important Message Given:  Yes - Medicare IM     Claretta Deed 08/02/2023, 4:14 PM

## 2023-08-02 NOTE — Evaluation (Signed)
 Physical Therapy Evaluation Patient Details Name: Angela [name] MRN: [id number] DOB: 03/23/1950 Today's Date: 08/02/2023  History of Present Illness  Angela [name] is a 73 y.o. female who presents with abdominal pain and bloating.  She underwent a CT scan of her abdomen pelvis, which noted mild left hydroureteronephrosis to the level of a obstructing 9 mm mid ureteral stone and large volume stool burden with stercoral colitis.  Urinalysis noted small hemoglobin, 21-50 RBCs/hpf, 6-10 squamous epithelial cells/hpf, and 6-10 WBCs.   Admission was recommended due to concern for sepsis from intra-abdominal infection, but patient declined for which she was prescribed Augmentin  and discharged home. After returning home, she experienced increased abdominal pain and bloating and returned to the hospital . In the emergency department, patient was noted to be tachypneic.  Labs showed hyponatremia with sodium of 132, lactic acid 2.4.  Repeat renal CT revealed interval development of intraperitoneal free air and moderate volume free fluid containing layering hyperdensities consistent with bowel perforation, distal left ureteral stone measuring 9 mm with interval decrease left hydroureteronephrosis.  Patient was then admitted to the hospital for further evaluation and treatment. Past medical history significant of hypertension, hyperlipidemia, glaucoma and prior tobacco abuse  Clinical Impression  Pt presents with admitting diagnosis above. Pt today was able to ambulate in hallway with RW CGA. PTA pt was fully independent. Recommend HHPT upon DC. PT will continue to follow.         If plan is discharge home, recommend the following: A little help with walking and/or transfers;A little help with bathing/dressing/bathroom;Assistance with cooking/housework;Direct supervision/assist for medications management;Assist for transportation;Help with stairs or ramp for entrance   Can travel by private vehicle         Equipment Recommendations Rolling walker (2 wheels)  Recommendations for Other Services       Functional Status Assessment Patient has had a recent decline in their functional status and demonstrates the ability to make significant improvements in function in a reasonable and predictable amount of time.     Precautions / Restrictions Precautions Precautions: Fall Recall of Precautions/Restrictions: Intact Restrictions Weight Bearing Restrictions Per Provider Order: No      Mobility  Bed Mobility               General bed mobility comments: Up in recliner    Transfers Overall transfer level: Needs assistance Equipment used: Rolling walker (2 wheels) Transfers: Sit to/from Stand Sit to Stand: Contact guard assist           General transfer comment: Cues for hand placement.    Ambulation/Gait Ambulation/Gait assistance: Contact guard assist Gait Distance (Feet): 300 Feet Assistive device: Rolling walker (2 wheels) Gait Pattern/deviations: Decreased stride length, Step-through pattern Gait velocity: decreased     General Gait Details: Slowed step through pattern  Stairs            Wheelchair Mobility     Tilt Bed    Modified Rankin (Stroke Patients Only)       Balance Overall balance assessment: Needs assistance Sitting-balance support: Bilateral upper extremity supported, Feet supported Sitting balance-Leahy Scale: Good     Standing balance support: Bilateral upper extremity supported, During functional activity Standing balance-Leahy Scale: Fair                               Pertinent Vitals/Pain Pain Assessment Pain Assessment: 0-10 Pain Score: 8  Pain Location: Abdomen Pain Descriptors / Indicators:  Sore Pain Intervention(s): Monitored during session, Limited activity within patient's tolerance, Premedicated before session    Home Living Family/patient expects to be discharged to:: Private residence Living  Arrangements: Spouse/significant other Available Help at Discharge: Family;Available PRN/intermittently Type of Home: House Home Access: Stairs to enter Entrance Stairs-Rails: None Entrance Stairs-Number of Steps: 3   Home Layout: One level Home Equipment: None      Prior Function Prior Level of Function : Independent/Modified Independent;Driving             Mobility Comments: Ind ADLs Comments: Ind     Extremity/Trunk Assessment   Upper Extremity Assessment Upper Extremity Assessment: Generalized weakness    Lower Extremity Assessment Lower Extremity Assessment: Generalized weakness    Cervical / Trunk Assessment Cervical / Trunk Assessment: Normal  Communication   Communication Communication: No apparent difficulties    Cognition Arousal: Alert Behavior During Therapy: Flat affect                             Following commands: Intact       Cueing Cueing Techniques: Verbal cues, Tactile cues     General Comments General comments (skin integrity, edema, etc.): VSS    Exercises     Assessment/Plan    PT Assessment Patient needs continued PT services  PT Problem List Decreased strength;Decreased range of motion;Decreased activity tolerance;Decreased balance;Decreased mobility;Decreased coordination;Decreased cognition;Decreased knowledge of use of DME;Decreased safety awareness;Decreased knowledge of precautions;Cardiopulmonary status limiting activity       PT Treatment Interventions DME instruction;Gait training;Stair training;Functional mobility training;Therapeutic exercise;Therapeutic activities;Balance training;Neuromuscular re-education;Cognitive remediation;Patient/family education;Wheelchair mobility training    PT Goals (Current goals can be found in the Care Plan section)  Acute Rehab PT Goals Patient Stated Goal: to get better PT Goal Formulation: With patient Time For Goal Achievement: 08/16/23 Potential to Achieve Goals:  Fair    Frequency Min 3X/week     Co-evaluation               AM-PAC PT 6 Clicks Mobility  Outcome Measure Help needed turning from your back to your side while in a flat bed without using bedrails?: A Little Help needed moving from lying on your back to sitting on the side of a flat bed without using bedrails?: A Little Help needed moving to and from a bed to a chair (including a wheelchair)?: A Little Help needed standing up from a chair using your arms (e.g., wheelchair or bedside chair)?: A Little Help needed to walk in hospital room?: A Little Help needed climbing 3-5 steps with a railing? : A Little 6 Click Score: 18    End of Session Equipment Utilized During Treatment:  (gait belt deferred due to new colostomy bag) Activity Tolerance: Patient tolerated treatment well Patient left: with call bell/phone within reach;in chair;with chair alarm set;with family/visitor present Nurse Communication: Mobility status PT Visit Diagnosis: Other abnormalities of gait and mobility (R26.89)    Time: 9142-9079 PT Time Calculation (min) (ACUTE ONLY): 23 min   Charges:   PT Evaluation $PT Eval Moderate Complexity: 1 Mod PT Treatments $Gait Training: 8-22 mins PT General Charges $$ ACUTE PT VISIT: 1 Visit         Sueellen NOVAK, PT, DPT Acute Rehab Services [phone]   Zoella Roberti 08/02/2023, 3:14 PM

## 2023-08-02 NOTE — Plan of Care (Signed)

## 2023-08-02 NOTE — Evaluation (Signed)
 Occupational Therapy Evaluation Patient Details Name: Angela [name] MRN: [id number] DOB: 06-14-1950 Today's Date: 08/02/2023   History of Present Illness   Angela [name] is a 73 y.o. female who presents with abdominal pain and bloating  Renal CT revealed interval development of intraperitoneal free air and moderate volume free fluid containing layering hyperdensities consistent with bowel perforation, distal left ureteral stone measuring 9 mm with interval decrease left hydroureteronephrosis. . Past medical history significant of hypertension, hyperlipidemia, glaucoma and prior tobacco abuse     Clinical Impressions Pt admitted for above, PTA pt reports being ind with ADLs/iADLs. Pt currently limited by abdominal pain and needing min A to CGA for ADLs, ambulating with CGA + RW. She also presents as generally weak compared to her baseline. OT to continue following pt acutely to progress with strengthening and progress pt closer to baseline. Patient would benefit from post acute Home OT services to help maximize functional independence in natural environment      If plan is discharge home, recommend the following:   A little help with bathing/dressing/bathroom;Assistance with cooking/housework     Functional Status Assessment   Patient has had a recent decline in their functional status and demonstrates the ability to make significant improvements in function in a reasonable and predictable amount of time.     Equipment Recommendations   Tub/shower seat     Recommendations for Other Services         Precautions/Restrictions   Precautions Precautions: Fall Recall of Precautions/Restrictions: Intact Restrictions Weight Bearing Restrictions Per Provider Order: No     Mobility Bed Mobility Overal bed mobility: Needs Assistance Bed Mobility: Supine to Sit, Sit to Supine     Supine to sit: Min assist Sit to supine: Min assist, Used rails   General bed mobility  comments: min A to assist with BLEs. cues for pt to assist with bed rail    Transfers Overall transfer level: Needs assistance Equipment used: Rolling walker (2 wheels) Transfers: Sit to/from Stand Sit to Stand: Contact guard assist           General transfer comment: Cues for hand placement.      Balance Overall balance assessment: Needs assistance Sitting-balance support: Bilateral upper extremity supported, Feet supported Sitting balance-Leahy Scale: Good     Standing balance support: Bilateral upper extremity supported, During functional activity Standing balance-Leahy Scale: Fair                             ADL either performed or assessed with clinical judgement   ADL Overall ADL's : Needs assistance/impaired Eating/Feeding: Independent;Sitting   Grooming: Standing;Wash/dry face;Contact guard assist   Upper Body Bathing: Standing;Contact guard assist   Lower Body Bathing: Sitting/lateral leans;Set up Lower Body Bathing Details (indicate cue type and reason): Discussed use of shower seat in the future. able to do figure four for LBB Upper Body Dressing : Sitting;Set up   Lower Body Dressing: Sitting/lateral leans;Contact guard assist Lower Body Dressing Details (indicate cue type and reason): possibly more assist with STS dressing. Pt deferred donning pants at this time. Toilet Transfer: Contact guard assist;Rolling walker (2 wheels);Comfort height toilet;Ambulation   Toileting- Clothing Manipulation and Hygiene: Minimal assistance;Sit to/from stand       Functional mobility during ADLs: Contact guard assist;Rolling walker (2 wheels)       Vision Baseline Vision/History: 1 Wears glasses Vision Assessment?: No apparent visual deficits     Perception  Praxis         Pertinent Vitals/Pain Pain Assessment Pain Assessment: 0-10 Pain Score: 8  Pain Location: Abdomen Pain Descriptors / Indicators: Sore Pain Intervention(s): RN gave  pain meds during session, Monitored during session     Extremity/Trunk Assessment Upper Extremity Assessment Upper Extremity Assessment: Generalized weakness   Lower Extremity Assessment Lower Extremity Assessment: Generalized weakness   Cervical / Trunk Assessment Cervical / Trunk Assessment: Normal   Communication Communication Communication: No apparent difficulties   Cognition Arousal: Lethargic Behavior During Therapy: Flat affect Cognition: No apparent impairments                               Following commands: Intact       Cueing  General Comments   Cueing Techniques: Verbal cues;Tactile cues  VSS   Exercises     Shoulder Instructions      Home Living Family/patient expects to be discharged to:: Private residence Living Arrangements: Spouse/significant other Available Help at Discharge: Family;Available PRN/intermittently Type of Home: House Home Access: Stairs to enter [employer] of Steps: 3 Entrance Stairs-Rails: None Home Layout: One level     Bathroom Shower/Tub: Tub/shower unit;Walk-in shower (Pt reports that she uses both)   Firefighter: Standard Bathroom Accessibility: Yes   Home Equipment: None          Prior Functioning/Environment Prior Level of Function : Independent/Modified Independent;Driving             Mobility Comments: Ind ADLs Comments: Ind    OT Problem List: Decreased strength;Pain;Impaired balance (sitting and/or standing)   OT Treatment/Interventions: Self-care/ADL training;Therapeutic exercise;Patient/family education;Balance training;Therapeutic activities;DME and/or AE instruction      OT Goals(Current goals can be found in the care plan section)   Acute Rehab OT Goals Patient Stated Goal: To return home; get pain down OT Goal Formulation: With patient Time For Goal Achievement: 08/16/23 Potential to Achieve Goals: Good ADL Goals Pt Will Perform Grooming: with  supervision;standing Pt Will Perform Lower Body Dressing: with supervision;sit to/from stand Pt Will Transfer to Toilet: with supervision;ambulating Pt Will Perform Toileting - Clothing Manipulation and hygiene: with supervision;sit to/from stand Pt Will Perform Tub/Shower Transfer: Shower transfer;with supervision;ambulating   OT Frequency:  Min 2X/week    Co-evaluation              AM-PAC OT 6 Clicks Daily Activity     Outcome Measure Help from another person eating meals?: None Help from another person taking care of personal grooming?: A Little Help from another person toileting, which includes using toliet, bedpan, or urinal?: A Little Help from another person bathing (including washing, rinsing, drying)?: A Little Help from another person to put on and taking off regular upper body clothing?: A Little Help from another person to put on and taking off regular lower body clothing?: A Little 6 Click Score: 19   End of Session Equipment Utilized During Treatment: Rolling walker (2 wheels) Nurse Communication: Mobility status  Activity Tolerance: Patient tolerated treatment well Patient left: in bed;with call bell/phone within reach;with family/visitor present;with bed alarm set  OT Visit Diagnosis: Unsteadiness on feet (R26.81);Other abnormalities of gait and mobility (R26.89);Pain;Muscle weakness (generalized) (M62.81) Pain - part of body:  (abdomen)                Time: 8557-8495 OT Time Calculation (min): 22 min Charges:  OT General Charges $OT Visit: 1 Visit OT Evaluation $OT Eval Moderate Complexity:  1 Mod  08/02/2023  AB, OTR/L  Acute Rehabilitation Services  Office: [phone]   Angela [name] 08/02/2023, 5:59 PM

## 2023-08-02 NOTE — Progress Notes (Signed)
 3 Days Post-Op  Subjective: Patient c/o pain rated at 6 out of 10 this morning.  States pain is worse with movement better with rest which is not abnormal postoperative.endorses feeling bloated and nausea.  Colostomy still with some air present.  Daughter at bedside.  Objective: Vital signs in last 24 hours: Temp:  [97.6 F (36.4 C)-98.1 F (36.7 C)] 97.8 F (36.6 C) (06/25 0750) Pulse Rate:  [74-91] 86 (06/25 0750) Resp:  [13-20] 20 (06/25 0750) BP: (102-120)/(56-78) 116/66 (06/25 0750) SpO2:  [90 %-94 %] 90 % (06/25 0750) Arterial Line BP: (70-125)/(53-57) 125/53 (06/24 1100) Last BM Date :  (PTA)  Intake/Output from previous day: 06/24 0701 - 06/25 0700 In: 2129.9 [P.O.:150; I.V.:1632.3; IV Piggyback:347.6] Out: 30 [Emesis/NG output:20; Stool:10] Intake/Output this shift: No intake/output data recorded.  PE: Gen: NAD, sitting up in her chair Heart: regular Lungs: effort nonlabored  Abd: soft, but with distention, colostomy viable and a bit edematous, but looks well. No output.  Midline wound is clean and packed.     Lab Results:  Recent Labs    08/01/23 0354 08/02/23 0412  WBC 11.0* 14.1*  HGB 9.1* 10.2*  HCT 27.5* 30.9*  PLT 274 325   BMET Recent Labs    08/01/23 0354 08/02/23 0412  NA 134* 134*  K 3.4* 3.9  CL 100 103  CO2 24 20*  GLUCOSE 73 72  BUN 29* 34*  CREATININE 0.85 0.80  CALCIUM  8.4* 8.1*   PT/INR No results for input(s): LABPROT, INR in the last 72 hours.  CMP     Component Value Date/Time   NA 134 (L) 08/02/2023 0412   NA 140 11/10/2007 0000   K 3.9 08/02/2023 0412   CL 103 08/02/2023 0412   CO2 20 (L) 08/02/2023 0412   GLUCOSE 72 08/02/2023 0412   BUN 34 (H) 08/02/2023 0412   BUN 10 11/10/2007 0000   CREATININE 0.80 08/02/2023 0412   CREATININE 0.74 06/26/2020 1522   CALCIUM  8.1 (L) 08/02/2023 0412   PROT 3.6 (L) 07/31/2023 0257   ALBUMIN 1.6 (L) 07/31/2023 0257   AST 16 07/31/2023 0257   ALT 10 07/31/2023 0257    ALKPHOS 25 (L) 07/31/2023 0257   BILITOT 0.7 07/31/2023 0257   GFRNONAA >60 08/02/2023 0412   Lipase     Component Value Date/Time   LIPASE <10 (L) 07/30/2023 0936       Studies/Results: US  RENAL Result Date: 08/01/2023 CLINICAL DATA:  Hydronephrosis EXAM: RENAL / URINARY TRACT ULTRASOUND COMPLETE COMPARISON:  CT abdomen pelvis 07/30/2023 FINDINGS: Right Kidney: Renal measurements: 11.4 x 4.8 x 4.6 cm = volume: 131 mL. Echogenicity within normal limits. No mass or hydronephrosis visualized. Left Kidney: Renal measurements: 12.2 x 5.0 x 4.7 cm = volume: 152 mL. Echogenicity within normal limits. No mass or hydronephrosis visualized. Bladder: Visualized due to overlying bandages. Other: None. IMPRESSION: No hydronephrosis. Electronically Signed   By: Norman Gatlin M.D.   On: 08/01/2023 22:19    Anti-infectives: Anti-infectives (From admission, onward)    Start     Dose/Rate Route Frequency Ordered Stop   07/30/23 1800  piperacillin-tazobactam (ZOSYN) IVPB 3.375 g        3.375 g 12.5 mL/hr over 240 Minutes Intravenous Every 8 hours 07/30/23 1332     07/30/23 1400  piperacillin-tazobactam (ZOSYN) IVPB 3.375 g  Status:  Discontinued        3.375 g 100 mL/hr over 30 Minutes Intravenous Every 8 hours 07/30/23 1329 07/30/23 1332  07/30/23 1015  piperacillin-tazobactam (ZOSYN) IVPB 3.375 g        3.375 g 100 mL/hr over 30 Minutes Intravenous  Once 07/30/23 1013 07/30/23 1311        Assessment/Plan POD 3, s/p ex lap with Hartmann's procedure for stercoral colitis with ischemia and perforation with feculent peritonitis, Dr. Polly 6/22  -NPO x chips and sips with meds -await return of bowel function -mobilize -spontaneously voiding -continue abx for at least 5 days post op -BID dressing changes to midline wound -WOC following -pulm toilet  -d/w patient with WOC, RN, and primary service  FEN - NPO X ice chips and sips with meds, K being replaced by primary service VTE -  lovenox ID - zosyn x 5 days post op  Nephrolithiasis - per medicine Chronic constipation - will need aggressive bowel regimen when bowel function returns HTN GLaucoma Anemia - hgb 10.2 this am    LOS: 3 days    Eulah Hammonds , [facility] 08/02/2023, 9:05 AM Please see Amion for pager number during day hours 7:00am-4:30pm or 7:00am -11:30am on weekends

## 2023-08-03 ENCOUNTER — Inpatient Hospital Stay (HOSPITAL_COMMUNITY)

## 2023-08-03 DIAGNOSIS — K631 Perforation of intestine (nontraumatic): Secondary | ICD-10-CM | POA: Diagnosis not present

## 2023-08-03 LAB — BASIC METABOLIC PANEL WITH GFR
Anion gap: 7 (ref 5–15)
BUN: 24 mg/dL — ABNORMAL HIGH (ref 8–23)
CO2: 25 mmol/L (ref 22–32)
Calcium: 8 mg/dL — ABNORMAL LOW (ref 8.9–10.3)
Chloride: 102 mmol/L (ref 98–111)
Creatinine, Ser: 0.65 mg/dL (ref 0.44–1.00)
GFR, Estimated: >60 mL/min (ref >60)
Glucose, Bld: 97 mg/dL (ref 70–99)
Potassium: 3.5 mmol/L (ref 3.5–5.1)
Sodium: 134 mmol/L — ABNORMAL LOW (ref 135–145)

## 2023-08-03 LAB — CBC
HCT: 29.5 % — ABNORMAL LOW (ref 36.0–46.0)
Hemoglobin: 9.9 g/dL — ABNORMAL LOW (ref 12.0–15.0)
MCH: 28.9 pg (ref 26.0–34.0)
MCHC: 33.6 g/dL (ref 30.0–36.0)
MCV: 86 fL (ref 80.0–100.0)
Platelets: 340 10*3/uL (ref 150–400)
RBC: 3.43 MIL/uL — ABNORMAL LOW (ref 3.87–5.11)
RDW: 14.3 % (ref 11.5–15.5)
WBC: 10.1 10*3/uL (ref 4.0–10.5)
nRBC: 0 % (ref 0.0–0.2)

## 2023-08-03 LAB — CULTURE, BLOOD (ROUTINE X 2)
Culture: NO GROWTH
Special Requests: ADEQUATE

## 2023-08-03 MED ORDER — FLEET ENEMA RE ENEM
1.0000 | ENEMA | Freq: Once | RECTAL | Status: AC
  Administered 2023-08-03: 1 via RECTAL
  Filled 2023-08-03: qty 1

## 2023-08-03 MED ORDER — BOOST / RESOURCE BREEZE PO LIQD CUSTOM
1.0000 | Freq: Three times a day (TID) | ORAL | Status: DC
  Administered 2023-08-03 – 2023-08-05 (×7): 1 via ORAL
  Administered 2023-08-05: 237 mL via ORAL
  Administered 2023-08-06 – 2023-08-10 (×10): 1 via ORAL

## 2023-08-03 MED ORDER — POTASSIUM CHLORIDE CRYS ER 20 MEQ PO TBCR
30.0000 meq | EXTENDED_RELEASE_TABLET | Freq: Four times a day (QID) | ORAL | Status: AC
  Administered 2023-08-03 (×2): 30 meq via ORAL
  Filled 2023-08-03 (×2): qty 1

## 2023-08-03 NOTE — Plan of Care (Signed)

## 2023-08-03 NOTE — Consult Note (Signed)
 WOC Nurse ostomy follow up Stoma type/location: LLQ colostomy Stomal assessment/size: 44 mm stoma, round, budded, os at center, pink, moist.   Peristomal assessment: intact Treatment options for stomal/peristomal skin: Ring #[id number] Output 50 ml of liquid stools on the bag at 0930. Ostomy pouching: 2pc. Barrier 2 3/4 #2, bag [id number]. Education provided: Provide educational ostomy care for the patient and her daughter. The pt did not follow the instructions, she was tired for the previous activities for today.   - The pouch need to be change twice per week or if is leaking. - Clean the skin with soup and water, or just water. Keep in mind that any product/moisturizing can stay at the skin before apply the next waffle, that will damage the seal.   - Cut the barrier with the same size and ostomy shape. - Take the protect plastic off the barrier. That can be use as a model for the next pouch system. The ostomy will change the size to a smaller one on the next weeks, keep measuring at least once a week to make sure if the size is correct.   - Apply on the skin, stretching a little the bottom of the ostomy site to apply correctly.   - Empty the pouch when becomes 1/3 full. - Teach how to open and close the lock in roll system. The daughter did with hands on. - A bag with filter will provide a release the gas without odor, does not need to protect against the water. - The pt can take a shower normally, the bag is waterproof.  Enrolled patient in [place] Secure Start Discharge program: Yes [phone])  WOC team will follow MON 1000. Please reconsult if further assistance is needed. Thank-you,  Lela Holm BSN, RN, [employer], WOC  (Pager: [phone])

## 2023-08-03 NOTE — Plan of Care (Signed)
 Pt has rested quietly throughout the night with no distress noted. Alert and oriented. ON room air. SR on the monitor. Up to BR to void with walker. Medicated for pain 3 times with dilaudid  with relief noted. Family member at bedside. No N/V. No other complaints voiced.     Problem: Education: Goal: Knowledge of General Education information will improve Description: Including pain rating scale, medication(s)/side effects and non-pharmacologic comfort measures Outcome: Progressing   Problem: Clinical Measurements: Goal: Respiratory complications will improve Outcome: Progressing Goal: Cardiovascular complication will be avoided Outcome: Progressing   Problem: Coping: Goal: Level of anxiety will decrease Outcome: Progressing   Problem: Pain Managment: Goal: General experience of comfort will improve and/or be controlled Outcome: Progressing

## 2023-08-03 NOTE — Progress Notes (Signed)
 PT Cancellation Note  Patient Details Name: Angela [name] MRN: [id number] DOB: [birth year]   Cancelled Treatment:    Reason Eval/Treat Not Completed: Other (comment) (Pt with ostomy nurse educating family. Will follow up if time allows.)   Olsen Mccutchan 08/03/2023, 9:33 AM

## 2023-08-03 NOTE — Progress Notes (Signed)
 Patient ID: Angela [name], female   DOB: 04/06/1950, 73 y.o.   MRN: [id number]   Acute Care Surgery Service Progress Note:    Chief Complaint/Subjective: Burping and belching some.  Family member at bedside. Had ostomy teaching this morning  Objective: Vital signs in last 24 hours: Temp:  [97.7 F (36.5 C)-98.2 F (36.8 C)] 97.7 F (36.5 C) (06/25 2000) Pulse Rate:  [79-95] 95 (06/26 0023) Resp:  [18-23] 18 (06/26 0023) BP: (107-146)/(66-83) 146/83 (06/26 0023) SpO2:  [92 %] 92 % (06/26 0023) Last BM Date :  (PTA)  Intake/Output from previous day: 06/25 0701 - 06/26 0700 In: 380 [P.O.:380] Out: -  Intake/Output this shift: No intake/output data recorded.  Lungs:  nonlabored  Cardiovascular: reg  Abd: Ostomy is viable.  Has ostomy sweat.  No air in the bag.  Midline dressing is intact.  She seems a little bit bloated and distended to me  Extremities: no edema, +SCDs  Neuro: alert, nonfocal  Lab Results: CBC  Recent Labs    08/02/23 0412 08/03/23 0603  WBC 14.1* 10.1  HGB 10.2* 9.9*  HCT 30.9* 29.5*  PLT 325 340   BMET Recent Labs    08/02/23 0412 08/03/23 0603  NA 134* 134*  K 3.9 3.5  CL 103 102  CO2 20* 25  GLUCOSE 72 97  BUN 34* 24*  CREATININE 0.80 0.65  CALCIUM  8.1* 8.0*   LFT    Latest Ref Rng & Units 07/31/2023    2:57 AM 07/30/2023    9:36 AM 07/29/2023    8:13 AM  Hepatic Function  Total Protein 6.5 - 8.1 g/dL 3.6  6.1  7.0   Albumin 3.5 - 5.0 g/dL 1.6  2.8  3.5   AST 15 - 41 U/L 16  23  23    ALT 0 - 44 U/L 10  14  12    Alk Phosphatase 38 - 126 U/L 25  60  105   Total Bilirubin 0.0 - 1.2 mg/dL 0.7  0.6  0.5    PT/INR No results for input(s): LABPROT, INR in the last 72 hours. ABG No results for input(s): PHART, HCO3 in the last 72 hours.  Invalid input(s): PCO2, PO2  Studies/Results:  Anti-infectives: Anti-infectives (From admission, onward)    Start     Dose/Rate Route Frequency Ordered Stop   07/30/23 1800   piperacillin-tazobactam (ZOSYN) IVPB 3.375 g        3.375 g 12.5 mL/hr over 240 Minutes Intravenous Every 8 hours 07/30/23 1332     07/30/23 1400  piperacillin-tazobactam (ZOSYN) IVPB 3.375 g  Status:  Discontinued        3.375 g 100 mL/hr over 30 Minutes Intravenous Every 8 hours 07/30/23 1329 07/30/23 1332   07/30/23 1015  piperacillin-tazobactam (ZOSYN) IVPB 3.375 g        3.375 g 100 mL/hr over 30 Minutes Intravenous  Once 07/30/23 1013 07/30/23 1311       Medications: Scheduled Meds:  acetaminophen   1,000 mg Oral Q6H   Chlorhexidine  Gluconate Cloth  6 each Topical Daily   enoxaparin (LOVENOX) injection  40 mg Subcutaneous Q24H   feeding supplement  1 Container Oral TID BM   latanoprost  1 drop Both Eyes QHS   methocarbamol  500 mg Oral TID   Or   methocarbamol (ROBAXIN) injection  500 mg Intravenous TID   sodium chloride flush  3 mL Intravenous Q12H   timolol  1 drop Both Eyes BID   Continuous Infusions:  sodium chloride 100 mL/hr at 08/02/23 1054   norepinephrine (LEVOPHED) Adult infusion     piperacillin-tazobactam 3.375 g (08/03/23 0828)   PRN Meds:.albuterol , HYDROmorphone  (DILAUDID ) injection, melatonin, metoprolol tartrate, ondansetron  **OR** ondansetron  (ZOFRAN ) IV, mouth rinse, oxyCODONE   Assessment/Plan: Patient Active Problem List   Diagnosis Date Noted   Bowel perforation (HCC) 07/30/2023   Hydroureteronephrosis 07/30/2023   Metabolic acidosis with increased anion gap and accumulation of organic acids 07/30/2023   Hyponatremia 07/30/2023   Glaucoma 07/30/2023   Perforated sigmoid colon (HCC) 07/30/2023   Chronic idiopathic constipation 06/25/2020   Hematochezia 06/25/2020   Hyperlipidemia 06/25/2020   Elevated hemoglobin A1c 01/22/2020   Leukocytosis 01/22/2020   Essential hypertension 06/19/2019   Abnormal mammogram 05/15/2017   POD 4, s/p ex lap with Hartmann's procedure for stercoral colitis with ischemia and perforation with feculent peritonitis,  Dr. Polly 6/22   -CLD -I do not think we should advance her today but I will give her some protein shakes as well. -Abdominal x-ray June 26 shows some air in the stomach, small bowel distention up to about 3-1/2 cm.  She does have some stool burden. -- will give enema via ostomy -await return of bowel function -mobilize -spontaneously voiding -Afebrile, white blood cell count trending down -continue abx for at least 5 days post op -BID dressing changes to midline wound -WOC following -continue ostomy education and wound teaching -pulm toilet  -d/w patient with WOC, RN, and primary service   FEN -clears with protein shakes  VTE - lovenox ID - zosyn x 5 days post op   Nephrolithiasis - per medicine Chronic constipation - will need aggressive bowel regimen when bowel function returns HTN GLaucoma Anemia - hgb stable Disposition:  LOS: 4 days    Angela [name] M. Tanda, MD, FACS General, Bariatric, & Minimally Invasive Surgery [phone] [facility], [facility]

## 2023-08-03 NOTE — Progress Notes (Signed)
 PROGRESS NOTE  Angela [name] FMW:[id number] DOB: Dec 13, 1950 DOA: 07/30/2023 PCP: Catherine Fuller A, DO   LOS: 4 days   Brief narrative:   Angela [name] is a 73 y.o. female with medical history significant o hypertension, hyperlipidemia, glaucoma and prior tobacco abuse who presents with abdominal pain and bloating.  She underwent a CT scan of her abdomen pelvis, which noted mild left hydroureteronephrosis to the level of a obstructing 9 mm mid ureteral stone and large volume stool burden with stercoral colitis.  Urinalysis noted small hemoglobin, 21-50 RBCs/hpf, 6-10 squamous epithelial cells/hpf, and 6-10 WBCs.   Admission was recommended due to concern for sepsis from intra-abdominal infection, but patient declined for which she was prescribed Augmentin  and discharged home. After returning home, she experienced increased abdominal pain and bloating and returned to the hospital . In the emergency department, patient was noted to be tachypneic.  Labs showed hyponatremia with sodium of 132, lactic acid 2.4.  Repeat renal CT revealed interval development of intraperitoneal free air and moderate volume free fluid containing layering hyperdensities consistent with bowel perforation, distal left ureteral stone measuring 9 mm with interval decrease left hydroureteronephrosis.  Patient was then admitted to the hospital for further evaluation and treatment.    Assessment/Plan: Principal Problem:   Bowel perforation (HCC) Active Problems:   Hydroureteronephrosis   Chronic idiopathic constipation   Metabolic acidosis with increased anion gap and accumulation of organic acids   Hyponatremia   Essential hypertension   Glaucoma   Perforated sigmoid colon (HCC)    Sepsis secondary to bowel perforation  -CT of the abdomen pelvis noting bowel perforation.  Status post exploratory laparotomy with Hartmann procedure for stercoral colitis with ischemia and perforation with feculent peritonitis on 07/30/2023.   -  on IV Zosyn.,  Continue at least for 5 days postoperatively, continue to monitor white blood cell count closely - Management per general surgery, imaging is pending to see if appropriate to advance diet.  . - She was encouraged to use incentive spirometer.  . -Wound care to follow for colostomy needs and education.   Hydroureteronephrosis secondary to left ureteral stone   Status post explorative laparotomy.  Renal function and urine output normal.  Denies further pain. - Does appear to be having left kidney stone in distal ureter, but serial imaging including follow-up from for CT abdomen pelvis to CT renal protocol showing improvement, and renal ultrasound obtained 6/24 showing no further hydronephrosis.   Chronic idiopathic constipation Patient is a 79-month history of constipation with hemorrhoids.  CT noted large stool burden on presentation.  Currently status post surgery.  No colostomy output yet.   Metabolic acidosis with elevated anion gap Improved at this time.  Latest bicarbonate of 24.   Hyponatremia Sodium has improved to 134.   Essential hypertension Lisinopril  on hold.  Latest blood pressure of 107/56.  Will continue to monitor  Glaucoma Will resume timolol drops.  DVT prophylaxis: enoxaparin (LOVENOX) injection 40 mg Start: 07/31/23 1000 SCDs Start: 07/30/23 1954 SCDs Start: 07/30/23 1953   Disposition: Uncertain at this time.  Likely need rehabilitation.  Status is: Inpatient Remains inpatient appropriate because: Pending clinical improvement, status post surgery, n.p.o. status, likely need for rehab medicine    Code Status:     Code Status: Full Code  Family Communication: Spoke with  daughter at bedside  Consultants: General Surgery  Procedures: Exploratory laparotomy with Hartmann procedure on 07/30/2023  Anti-infectives:  Zosyn IV  Anti-infectives (From admission, onward)  Start     Dose/Rate Route Frequency Ordered Stop   07/30/23 1800   piperacillin-tazobactam (ZOSYN) IVPB 3.375 g        3.375 g 12.5 mL/hr over 240 Minutes Intravenous Every 8 hours 07/30/23 1332     07/30/23 1400  piperacillin-tazobactam (ZOSYN) IVPB 3.375 g  Status:  Discontinued        3.375 g 100 mL/hr over 30 Minutes Intravenous Every 8 hours 07/30/23 1329 07/30/23 1332   07/30/23 1015  piperacillin-tazobactam (ZOSYN) IVPB 3.375 g        3.375 g 100 mL/hr over 30 Minutes Intravenous  Once 07/30/23 1013 07/30/23 1311       Subjective:  Abdominal pain is controlled, has some burping and belching.  Objective: Vitals:   08/03/23 0000 08/03/23 0023  BP:  (!) 146/83  Pulse:  95  Resp: 18 18  Temp:    SpO2:  92%   No intake or output data in the 24 hours ending 08/03/23 1410  Filed Weights   07/30/23 1405 07/30/23 1930 07/31/23 0300  Weight: 57.2 kg 60 kg 61.8 kg   Body mass index is 23.39 kg/m.   Physical Exam:  Awake Alert, Oriented X 3, No new F.N deficits, Normal affect Symmetrical Chest wall movement, major entry at the bases RRR,No Gallops,Rubs or new Murmurs, No Parasternal Heave +ve B.Sounds, ostomy present, midline surgical wound bandaged  no Cyanosis, Clubbing ,trace  edema.   Data Review: I have personally reviewed the following laboratory data and studies,  CBC: Recent Labs  Lab 07/29/23 0826 07/30/23 0936 07/30/23 1724 07/30/23 2039 07/31/23 0257 08/01/23 0354 08/02/23 0412 08/03/23 0603  WBC 12.6* 8.8  --  8.1 9.1 11.0* 14.1* 10.1  NEUTROABS 10.8* 7.9*  --   --   --   --   --   --   HGB 11.6* 12.9   < > 10.3* 9.1* 9.1* 10.2* 9.9*  HCT 34.5* 37.4   < > 30.9* 28.4* 27.5* 30.9* 29.5*  MCV 84.8 84.8  --  87.0 89.6 87.3 86.3 86.0  PLT 408* 344  --  299 226 274 325 340   < > = values in this interval not displayed.   Basic Metabolic Panel: Recent Labs  Lab 07/30/23 0936 07/30/23 1724 07/30/23 2039 07/31/23 0257 08/01/23 0354 08/02/23 0412 08/03/23 0603  NA 132* 133*  --  134* 134* 134* 134*  K 4.0 4.0   --  4.0 3.4* 3.9 3.5  CL 95*  --   --  104 100 103 102  CO2 21*  --   --  17* 24 20* 25  GLUCOSE 142*  --   --  83 73 72 97  BUN 32*  --   --  22 29* 34* 24*  CREATININE 0.85  --  0.79 0.66 0.85 0.80 0.65  CALCIUM  9.5  --   --  7.5* 8.4* 8.1* 8.0*  MG  --   --   --   --   --  1.8  --   PHOS  --   --   --   --   --  2.7  --    Liver Function Tests: Recent Labs  Lab 07/29/23 0813 07/30/23 0936 07/31/23 0257  AST 23 23 16   ALT 12 14 10   ALKPHOS 105 60 25*  BILITOT 0.5 0.6 0.7  PROT 7.0 6.1* 3.6*  ALBUMIN 3.5 2.8* 1.6*   Recent Labs  Lab 07/29/23 0813 07/30/23 0936  LIPASE <10* <10*  No results for input(s): AMMONIA in the last 168 hours. Cardiac Enzymes: No results for input(s): CKTOTAL, CKMB, CKMBINDEX, TROPONINI in the last 168 hours. BNP (last 3 results) No results for input(s): BNP in the last 8760 hours.  ProBNP (last 3 results) No results for input(s): PROBNP in the last 8760 hours.  CBG: No results for input(s): GLUCAP in the last 168 hours. Recent Results (from the past 240 hours)  Resp panel by RT-PCR (RSV, Flu A&B, Covid) Anterior Nasal Swab     Status: None   Collection Time: 07/29/23  8:26 AM   Specimen: Anterior Nasal Swab  Result Value Ref Range Status   SARS Coronavirus 2 by RT PCR NEGATIVE NEGATIVE Final    Comment: (NOTE) SARS-CoV-2 target nucleic acids are NOT DETECTED.  The SARS-CoV-2 RNA is generally detectable in upper respiratory specimens during the acute phase of infection. The lowest concentration of SARS-CoV-2 viral copies this assay can detect is 138 copies/mL. A negative result does not preclude SARS-Cov-2 infection and should not be used as the sole basis for treatment or other patient management decisions. A negative result may occur with  improper specimen collection/handling, submission of specimen other than nasopharyngeal swab, presence of viral mutation(s) within the areas targeted by this assay, and inadequate  number of viral copies(<138 copies/mL). A negative result must be combined with clinical observations, patient history, and epidemiological information. The expected result is Negative.  Fact Sheet for Patients:  BloggerCourse.com  Fact Sheet for Healthcare Providers:  SeriousBroker.it  This test is no t yet approved or cleared by the [place]  FDA and  has been authorized for detection and/or diagnosis of SARS-CoV-2 by FDA under an Emergency Use Authorization (EUA). This EUA will remain  in effect (meaning this test can be used) for the duration of the COVID-19 declaration under Section 564(b)(1) of the Act, 21 U.S.C.section 360bbb-3(b)(1), unless the authorization is terminated  or revoked sooner.       Influenza A by PCR NEGATIVE NEGATIVE Final   Influenza B by PCR NEGATIVE NEGATIVE Final    Comment: (NOTE) The Xpert Xpress SARS-CoV-2/FLU/RSV plus assay is intended as an aid in the diagnosis of influenza from Nasopharyngeal swab specimens and should not be used as a sole basis for treatment. Nasal washings and aspirates are unacceptable for Xpert Xpress SARS-CoV-2/FLU/RSV testing.  Fact Sheet for Patients: BloggerCourse.com  Fact Sheet for Healthcare Providers: SeriousBroker.it  This test is not yet approved or cleared by the [place]  FDA and has been authorized for detection and/or diagnosis of SARS-CoV-2 by FDA under an Emergency Use Authorization (EUA). This EUA will remain in effect (meaning this test can be used) for the duration of the COVID-19 declaration under Section 564(b)(1) of the Act, 21 U.S.C. section 360bbb-3(b)(1), unless the authorization is terminated or revoked.     Resp Syncytial Virus by PCR NEGATIVE NEGATIVE Final    Comment: (NOTE) Fact Sheet for Patients: BloggerCourse.com  Fact Sheet for Healthcare  Providers: SeriousBroker.it  This test is not yet approved or cleared by the [place]  FDA and has been authorized for detection and/or diagnosis of SARS-CoV-2 by FDA under an Emergency Use Authorization (EUA). This EUA will remain in effect (meaning this test can be used) for the duration of the COVID-19 declaration under Section 564(b)(1) of the Act, 21 U.S.C. section 360bbb-3(b)(1), unless the authorization is terminated or revoked.  Performed at [facility], [address] [place], [place] [address]   Blood Culture (routine x 2)  Status: None (Preliminary result)   Collection Time: 07/29/23  8:29 AM   Specimen: BLOOD  Result Value Ref Range Status   Specimen Description   Final    BLOOD RIGHT ANTECUBITAL Performed at [facility], [address] [place], [place] [address]    Special Requests   Final    BOTTLES DRAWN AEROBIC AND ANAEROBIC Blood Culture adequate volume Performed at [facility], [address] [place], [place] [address]    Culture  Setup Time   Final    GRAM POSITIVE COCCI IN CHAINS ANAEROBIC BOTTLE ONLY CRITICAL RESULT CALLED TO, READ BACK BY AND VERIFIED WITH: PHARMD HEATHER WILSON ON 08/01/23 @ 1250 BY DRT    Culture   Final    CULTURE REINCUBATED FOR BETTER GROWTH Performed at [facility], [address], [place], [place] [address]    Report Status PENDING  Incomplete  Blood Culture ID Panel (Reflexed)     Status: None   Collection Time: 07/29/23  8:29 AM  Result Value Ref Range Status   Enterococcus faecalis NOT DETECTED NOT DETECTED Final   Enterococcus Faecium NOT DETECTED NOT DETECTED Final   Listeria monocytogenes NOT DETECTED NOT DETECTED Final   Staphylococcus species NOT DETECTED NOT DETECTED Final   Staphylococcus aureus (BCID) NOT DETECTED NOT DETECTED Final   Staphylococcus epidermidis NOT DETECTED NOT DETECTED Final   Staphylococcus  lugdunensis NOT DETECTED NOT DETECTED Final   Streptococcus species NOT DETECTED NOT DETECTED Final   Streptococcus agalactiae NOT DETECTED NOT DETECTED Final   Streptococcus pneumoniae NOT DETECTED NOT DETECTED Final   Streptococcus pyogenes NOT DETECTED NOT DETECTED Final   A.calcoaceticus-baumannii NOT DETECTED NOT DETECTED Final   Bacteroides fragilis NOT DETECTED NOT DETECTED Final   Enterobacterales NOT DETECTED NOT DETECTED Final   Enterobacter cloacae complex NOT DETECTED NOT DETECTED Final   Escherichia coli NOT DETECTED NOT DETECTED Final   Klebsiella aerogenes NOT DETECTED NOT DETECTED Final   Klebsiella oxytoca NOT DETECTED NOT DETECTED Final   Klebsiella pneumoniae NOT DETECTED NOT DETECTED Final   Proteus species NOT DETECTED NOT DETECTED Final   Salmonella species NOT DETECTED NOT DETECTED Final   Serratia marcescens NOT DETECTED NOT DETECTED Final   Haemophilus influenzae NOT DETECTED NOT DETECTED Final   Neisseria meningitidis NOT DETECTED NOT DETECTED Final   Pseudomonas aeruginosa NOT DETECTED NOT DETECTED Final   Stenotrophomonas maltophilia NOT DETECTED NOT DETECTED Final   Candida albicans NOT DETECTED NOT DETECTED Final   Candida auris NOT DETECTED NOT DETECTED Final   Candida glabrata NOT DETECTED NOT DETECTED Final   Candida krusei NOT DETECTED NOT DETECTED Final   Candida parapsilosis NOT DETECTED NOT DETECTED Final   Candida tropicalis NOT DETECTED NOT DETECTED Final   Cryptococcus neoformans/gattii NOT DETECTED NOT DETECTED Final    Comment: Performed at [facility], [address], [place], [place] [address]  Blood Culture (routine x 2)     Status: None   Collection Time: 07/29/23  8:31 AM   Specimen: BLOOD RIGHT FOREARM  Result Value Ref Range Status   Specimen Description   Final    BLOOD RIGHT FOREARM Performed at [facility], [address] [place], [place] [address]    Special Requests   Final    BOTTLES DRAWN AEROBIC AND ANAEROBIC  Blood Culture adequate volume Performed at [facility], [address], [place], [place] [address]    Culture   Final    NO GROWTH 5  DAYS Performed at [facility], [address] [place], [place] [address]    Report Status 08/03/2023 FINAL  Final  Urine Culture     Status: None   Collection Time: 07/30/23  9:36 AM   Specimen: Urine, Clean Catch  Result Value Ref Range Status   Specimen Description   Final    URINE, CLEAN CATCH Performed at [facility], [address], [place], [place] [address]    Special Requests   Final    NONE Performed at [facility], [address], [place], [place] [address]    Culture   Final    NO GROWTH Performed at [facility], [address] [place], [place] [address]    Report Status 07/31/2023 FINAL  Final  Culture, blood (Routine X 2) w Reflex to ID Panel     Status: None (Preliminary result)   Collection Time: 07/30/23 11:00 AM   Specimen: BLOOD  Result Value Ref Range Status   Specimen Description   Final    BLOOD LEFT ANTECUBITAL Performed at [facility], [address], [place], [place] [address]    Special Requests   Final    BOTTLES DRAWN AEROBIC AND ANAEROBIC Blood Culture results may not be optimal due to an inadequate volume of blood received in culture bottles Performed at [facility], [address], [place], [place] [address]    Culture   Final    NO GROWTH 4 DAYS Performed at [facility], [address], [place], [place] [address]    Report Status PENDING  Incomplete  Culture, blood (Routine X 2) w Reflex to ID Panel     Status: None (Preliminary result)   Collection Time: 07/30/23 11:01 AM   Specimen: BLOOD  Result Value Ref Range Status   Specimen Description   Final    BLOOD RIGHT ANTECUBITAL Performed at [facility], [address], [place], [place] [address]    Special  Requests   Final    BOTTLES DRAWN AEROBIC AND ANAEROBIC Blood Culture results may not be optimal due to an inadequate volume of blood received in culture bottles Performed at [facility], [address], [place], [place] [address]    Culture   Final    NO GROWTH 4 DAYS Performed at [facility], [address] [place], [place] [address]    Report Status PENDING  Incomplete  MRSA Next Gen by PCR, Nasal     Status: None   Collection Time: 07/30/23  8:38 PM   Specimen: Nasal Mucosa; Nasal Swab  Result Value Ref Range Status   MRSA by PCR Next Gen NOT DETECTED NOT DETECTED Final    Comment: (NOTE) The GeneXpert MRSA Assay (FDA approved for NASAL specimens only), is one component of a comprehensive MRSA colonization surveillance program. It is not intended to diagnose MRSA infection nor to guide or monitor treatment for MRSA infections. Test performance is not FDA approved in patients less than 50 years old. Performed at [facility], [address], [place], [place] [address]      Studies: US  RENAL Result Date: 08/01/2023 CLINICAL DATA:  Hydronephrosis EXAM: RENAL / URINARY TRACT ULTRASOUND COMPLETE COMPARISON:  CT abdomen pelvis 07/30/2023 FINDINGS: Right Kidney: Renal measurements: 11.4 x 4.8 x 4.6 cm = volume: 131 mL. Echogenicity within normal limits. No mass or hydronephrosis visualized. Left Kidney: Renal measurements: 12.2 x 5.0 x 4.7 cm = volume: 152 mL.  Echogenicity within normal limits. No mass or hydronephrosis visualized. Bladder: Visualized due to overlying bandages. Other: None. IMPRESSION: No hydronephrosis. Electronically Signed   By: Norman Gatlin M.D.   On: 08/01/2023 22:19       Brayton Lye, MD  [facility] 08/03/2023  If 7PM-7AM, please contact night-coverage

## 2023-08-03 NOTE — Progress Notes (Incomplete)
 4 Days Post-Op  Subjective: Patient c/o pain rated at 6 out of 10 this morning.  States pain is worse with movement better with rest which is not abnormal postoperative.endorses feeling bloated and nausea.  Colostomy still with some air present.  Daughter at bedside.  Objective: Vital signs in last 24 hours: Temp:  [97.7 F (36.5 C)-98.2 F (36.8 C)] 97.7 F (36.5 C) (06/25 2000) Pulse Rate:  [79-95] 95 (06/26 0023) Resp:  [18-23] 18 (06/26 0023) BP: (107-146)/(66-83) 146/83 (06/26 0023) SpO2:  [92 %] 92 % (06/26 0023) Last BM Date :  (PTA)  Intake/Output from previous day: 06/25 0701 - 06/26 0700 In: 380 [P.O.:380] Out: -  Intake/Output this shift: No intake/output data recorded.  PE: Gen: NAD, sitting up in her chair Heart: regular Lungs: effort nonlabored  Abd: soft, but with distention, colostomy viable and a bit edematous, but looks well. No output.  Midline wound is clean and packed.     Lab Results:  Recent Labs    08/02/23 0412 08/03/23 0603  WBC 14.1* 10.1  HGB 10.2* 9.9*  HCT 30.9* 29.5*  PLT 325 340   BMET Recent Labs    08/02/23 0412 08/03/23 0603  NA 134* 134*  K 3.9 3.5  CL 103 102  CO2 20* 25  GLUCOSE 72 97  BUN 34* 24*  CREATININE 0.80 0.65  CALCIUM  8.1* 8.0*   PT/INR No results for input(s): LABPROT, INR in the last 72 hours.  CMP     Component Value Date/Time   NA 134 (L) 08/03/2023 0603   NA 140 11/10/2007 0000   K 3.5 08/03/2023 0603   CL 102 08/03/2023 0603   CO2 25 08/03/2023 0603   GLUCOSE 97 08/03/2023 0603   BUN 24 (H) 08/03/2023 0603   BUN 10 11/10/2007 0000   CREATININE 0.65 08/03/2023 0603   CREATININE 0.74 06/26/2020 1522   CALCIUM  8.0 (L) 08/03/2023 0603   PROT 3.6 (L) 07/31/2023 0257   ALBUMIN 1.6 (L) 07/31/2023 0257   AST 16 07/31/2023 0257   ALT 10 07/31/2023 0257   ALKPHOS 25 (L) 07/31/2023 0257   BILITOT 0.7 07/31/2023 0257   GFRNONAA >60 08/03/2023 0603   Lipase     Component Value Date/Time    LIPASE <10 (L) 07/30/2023 0936       Studies/Results: US  RENAL Result Date: 08/01/2023 CLINICAL DATA:  Hydronephrosis EXAM: RENAL / URINARY TRACT ULTRASOUND COMPLETE COMPARISON:  CT abdomen pelvis 07/30/2023 FINDINGS: Right Kidney: Renal measurements: 11.4 x 4.8 x 4.6 cm = volume: 131 mL. Echogenicity within normal limits. No mass or hydronephrosis visualized. Left Kidney: Renal measurements: 12.2 x 5.0 x 4.7 cm = volume: 152 mL. Echogenicity within normal limits. No mass or hydronephrosis visualized. Bladder: Visualized due to overlying bandages. Other: None. IMPRESSION: No hydronephrosis. Electronically Signed   By: Norman Gatlin M.D.   On: 08/01/2023 22:19    Anti-infectives: Anti-infectives (From admission, onward)    Start     Dose/Rate Route Frequency Ordered Stop   07/30/23 1800  piperacillin-tazobactam (ZOSYN) IVPB 3.375 g        3.375 g 12.5 mL/hr over 240 Minutes Intravenous Every 8 hours 07/30/23 1332     07/30/23 1400  piperacillin-tazobactam (ZOSYN) IVPB 3.375 g  Status:  Discontinued        3.375 g 100 mL/hr over 30 Minutes Intravenous Every 8 hours 07/30/23 1329 07/30/23 1332   07/30/23 1015  piperacillin-tazobactam (ZOSYN) IVPB 3.375 g  3.375 g 100 mL/hr over 30 Minutes Intravenous  Once 07/30/23 1013 07/30/23 1311        Assessment/Plan POD 4, s/p ex lap with Hartmann's procedure for stercoral colitis with ischemia and perforation with feculent peritonitis, Dr. Polly 6/22  -NPO x chips and sips with meds -await return of bowel function -mobilize -spontaneously voiding -continue abx for at least 5 days post op -BID dressing changes to midline wound -WOC following -pulm toilet  -d/w patient with WOC, RN, and primary service  FEN - NPO X ice chips and sips with meds, K being replaced by primary service VTE - lovenox ID - zosyn x 5 days post op  Nephrolithiasis - per medicine Chronic constipation - will need aggressive bowel regimen when bowel  function returns HTN GLaucoma Anemia - hgb 10.2 this am    LOS: 4 days    Eulah Hammonds , [facility] 08/03/2023, 8:30 AM Please see Amion for pager number during day hours 7:00am-4:30pm or 7:00am -11:30am on weekends

## 2023-08-03 NOTE — Progress Notes (Signed)
 Physical Therapy Treatment Patient Details Name: Angela [name] MRN: [id number] DOB: [birth year] Today's Date: 08/03/2023   History of Present Illness Angela [name] is a 73 y.o. female who presents with abdominal pain and bloating  Renal CT revealed interval development of intraperitoneal free air and moderate volume free fluid containing layering hyperdensities consistent with bowel perforation, distal left ureteral stone measuring 9 mm with interval decrease left hydroureteronephrosis. . Past medical history significant of hypertension, hyperlipidemia, glaucoma and prior tobacco abuse    PT Comments  Pt with fair tolerance to treatment today. Pt received ambulating to bathroom with NT. Pt able to ambulate in hallway however too fatigued to perform steps today. Patient needs to practice stairs next session. No change in DC/DME recs at this time. PT will continue to follow.     If plan is discharge home, recommend the following: A little help with walking and/or transfers;A little help with bathing/dressing/bathroom;Assistance with cooking/housework;Direct supervision/assist for medications management;Assist for transportation;Help with stairs or ramp for entrance   Can travel by private vehicle        Equipment Recommendations  Rolling walker (2 wheels)    Recommendations for Other Services       Precautions / Restrictions Precautions Precautions: Fall Recall of Precautions/Restrictions: Intact Restrictions Weight Bearing Restrictions Per Provider Order: No     Mobility  Bed Mobility Overal bed mobility: Needs Assistance Bed Mobility: Sit to Supine       Sit to supine: Contact guard assist   General bed mobility comments: Family wanting to assist pt back to bed. Pt can perform on her own.    Transfers Overall transfer level: Needs assistance Equipment used: Rolling walker (2 wheels) Transfers: Sit to/from Stand Sit to Stand: Contact guard assist           General  transfer comment: Cues for hand placement.    Ambulation/Gait Ambulation/Gait assistance: Contact guard assist Gait Distance (Feet): 125 Feet Assistive device: Rolling walker (2 wheels) Gait Pattern/deviations: Decreased stride length, Step-through pattern Gait velocity: decreased     General Gait Details: Slowed step through pattern. Constan cues for proximity to RW.   Stairs             Wheelchair Mobility     Tilt Bed    Modified Rankin (Stroke Patients Only)       Balance Overall balance assessment: Needs assistance Sitting-balance support: Bilateral upper extremity supported, Feet supported Sitting balance-Leahy Scale: Good     Standing balance support: Bilateral upper extremity supported, During functional activity Standing balance-Leahy Scale: Fair                              Hotel manager: No apparent difficulties  Cognition Arousal: Alert Behavior During Therapy: Flat affect                           PT - Cognition Comments: Very flat affect. Following commands: Intact      Cueing Cueing Techniques: Verbal cues, Tactile cues  Exercises      General Comments General comments (skin integrity, edema, etc.): VSS. Daughter present.      Pertinent Vitals/Pain Pain Assessment Pain Assessment: Faces Faces Pain Scale: Hurts a little bit Pain Location: Abdomen Pain Descriptors / Indicators: Sore Pain Intervention(s): Monitored during session    Home Living  Prior Function            PT Goals (current goals can now be found in the care plan section) Progress towards PT goals: Progressing toward goals    Frequency    Min 3X/week      PT Plan      Co-evaluation              AM-PAC PT 6 Clicks Mobility   Outcome Measure  Help needed turning from your back to your side while in a flat bed without using bedrails?: A Little Help needed  moving from lying on your back to sitting on the side of a flat bed without using bedrails?: A Little Help needed moving to and from a bed to a chair (including a wheelchair)?: A Little Help needed standing up from a chair using your arms (e.g., wheelchair or bedside chair)?: A Little Help needed to walk in hospital room?: A Little Help needed climbing 3-5 steps with a railing? : A Little 6 Click Score: 18    End of Session Equipment Utilized During Treatment:  (Gait belt deferred due to new colostomy bag) Activity Tolerance: Patient tolerated treatment well Patient left: with call bell/phone within reach;in chair;with chair alarm set;with family/visitor present Nurse Communication: Mobility status PT Visit Diagnosis: Other abnormalities of gait and mobility (R26.89)     Time: 8967-8954 PT Time Calculation (min) (ACUTE ONLY): 13 min  Charges:    $Gait Training: 8-22 mins PT General Charges $$ ACUTE PT VISIT: 1 Visit                     Sueellen NOVAK, PT, DPT Acute Rehab Services [phone]    Casin Federici 08/03/2023, 2:57 PM

## 2023-08-04 DIAGNOSIS — K631 Perforation of intestine (nontraumatic): Secondary | ICD-10-CM | POA: Diagnosis not present

## 2023-08-04 LAB — CBC
HCT: 30.5 % — ABNORMAL LOW (ref 36.0–46.0)
Hemoglobin: 10.1 g/dL — ABNORMAL LOW (ref 12.0–15.0)
MCH: 28.7 pg (ref 26.0–34.0)
MCHC: 33.1 g/dL (ref 30.0–36.0)
MCV: 86.6 fL (ref 80.0–100.0)
Platelets: 371 10*3/uL (ref 150–400)
RBC: 3.52 MIL/uL — ABNORMAL LOW (ref 3.87–5.11)
RDW: 14.4 % (ref 11.5–15.5)
WBC: 9.8 10*3/uL (ref 4.0–10.5)
nRBC: 0.2 % (ref 0.0–0.2)

## 2023-08-04 LAB — CULTURE, BLOOD (ROUTINE X 2)
Culture: NO GROWTH
Culture: NO GROWTH

## 2023-08-04 LAB — BASIC METABOLIC PANEL WITH GFR
Anion gap: 9 (ref 5–15)
BUN: 15 mg/dL (ref 8–23)
CO2: 21 mmol/L — ABNORMAL LOW (ref 22–32)
Calcium: 7.9 mg/dL — ABNORMAL LOW (ref 8.9–10.3)
Chloride: 104 mmol/L (ref 98–111)
Creatinine, Ser: 0.5 mg/dL (ref 0.44–1.00)
GFR, Estimated: >60 mL/min (ref >60)
Glucose, Bld: 126 mg/dL — ABNORMAL HIGH (ref 70–99)
Potassium: 3.7 mmol/L (ref 3.5–5.1)
Sodium: 134 mmol/L — ABNORMAL LOW (ref 135–145)

## 2023-08-04 LAB — MAGNESIUM: Magnesium: 1.5 mg/dL — ABNORMAL LOW (ref 1.7–2.4)

## 2023-08-04 LAB — PHOSPHORUS: Phosphorus: 2.6 mg/dL (ref 2.5–4.6)

## 2023-08-04 MED ORDER — PANTOPRAZOLE SODIUM 40 MG IV SOLR
40.0000 mg | INTRAVENOUS | Status: DC
  Administered 2023-08-04 – 2023-08-09 (×5): 40 mg via INTRAVENOUS
  Filled 2023-08-04 (×5): qty 10

## 2023-08-04 MED ORDER — MAGNESIUM SULFATE 4 GM/100ML IV SOLN
4.0000 g | Freq: Once | INTRAVENOUS | Status: AC
  Administered 2023-08-04: 4 g via INTRAVENOUS
  Filled 2023-08-04: qty 100

## 2023-08-04 MED ORDER — CARVEDILOL 3.125 MG PO TABS
3.1250 mg | ORAL_TABLET | Freq: Two times a day (BID) | ORAL | Status: DC
  Administered 2023-08-04 – 2023-08-11 (×14): 3.125 mg via ORAL
  Filled 2023-08-04 (×14): qty 1

## 2023-08-04 MED ORDER — POTASSIUM PHOSPHATES 15 MMOLE/5ML IV SOLN
15.0000 mmol | Freq: Once | INTRAVENOUS | Status: AC
  Administered 2023-08-04: 15 mmol via INTRAVENOUS
  Filled 2023-08-04: qty 5

## 2023-08-04 MED ORDER — POTASSIUM CHLORIDE CRYS ER 20 MEQ PO TBCR
40.0000 meq | EXTENDED_RELEASE_TABLET | Freq: Once | ORAL | Status: AC
  Administered 2023-08-04: 40 meq via ORAL
  Filled 2023-08-04: qty 2

## 2023-08-04 NOTE — Progress Notes (Signed)
 PROGRESS NOTE  Angela [name] FMW:[id number] DOB: 1950-04-10 DOA: 07/30/2023 PCP: Catherine Fuller A, DO   LOS: 5 days   Brief narrative:   Angela [name] is a 73 y.o. female with medical history significant o hypertension, hyperlipidemia, glaucoma and prior tobacco abuse who presents with abdominal pain and bloating. CT abdomen pelvis, which  mild left hydroureteronephrosis and large volume stool burden with stercoral colitis.   Admission was recommended due to concern for sepsis from intra-abdominal infection, but patient declined for which she was prescribed Augmentin  and discharged home.  She presents back with worsening pain, repeat renal CT revealed interval development of intraperitoneal free air and moderate volume free fluid containing layering hyperdensities consistent with bowel perforation, distal left ureteral stone measuring 9 mm with interval decrease left hydroureteronephrosis.  Patient went to surgery for bowel perforation, please see discussion below.    Assessment/Plan: Principal Problem:   Bowel perforation (HCC) Active Problems:   Hydroureteronephrosis   Chronic idiopathic constipation   Metabolic acidosis with increased anion gap and accumulation of organic acids   Hyponatremia   Essential hypertension   Glaucoma   Perforated sigmoid colon (HCC)    Sepsis secondary to bowel perforation  -CT of the abdomen pelvis noting bowel perforation.  Status post exploratory laparotomy with Hartmann procedure for stercoral colitis with ischemia and perforation with feculent peritonitis on 07/30/2023.  -  on IV Zosyn .,  Continue at least for 5 days postoperatively, complete course today. - Management per general surgery, clear liquid diet, hold on advancement given some evidence of ileus. - He was encouraged to ambulate, use incentive spirometer. - She was encouraged to use incentive spirometer.  . -Wound care to follow for colostomy needs and education. - Will start on Protonix  given some reflux  Postoperative ileus -Encouraged to ambulate, minimize narcotics, replete electrolyte for target ofK > 5, Mg > 2, Phos > 3    Hydroureteronephrosis secondary to left ureteral stone   Status post explorative laparotomy.  Renal function and urine output normal.  Denies further pain. - Does appear to be having left kidney stone in distal ureter, but serial imaging including follow-up from for CT abdomen pelvis to CT renal protocol showing improvement, and renal ultrasound obtained 6/24 showing no further hydronephrosis.   Chronic idiopathic constipation Patient is a 67-month history of constipation with hemorrhoids.  CT noted large stool burden on presentation.  Currently status post surgery.  No colostomy output yet.   Metabolic acidosis with elevated anion gap Improved at this time.  Latest bicarbonate of 24.   Hyponatremia Sodium has improved to 134.   Essential hypertension Lisinopril  on hold. Blood pressure started to increase, so we will start on low-dose Coreg especially with heart rate mildly elevated.  Glaucoma Will resume timolol  drops.  DVT prophylaxis: enoxaparin  (LOVENOX ) injection 40 mg Start: 07/31/23 1000 SCDs Start: 07/30/23 1954 SCDs Start: 07/30/23 1953   Disposition: Uncertain at this time.  Likely need rehabilitation.  Status is: Inpatient Remains inpatient appropriate because: Pending clinical improvement, status post surgery, n.p.o. status, likely need for rehab medicine    Code Status:     Code Status: Full Code  Family Communication: Spoke with  daughter at bedside  Consultants: General Surgery  Procedures: Exploratory laparotomy with Hartmann procedure on 07/30/2023  Anti-infectives:  Zosyn  IV  Anti-infectives (From admission, onward)    Start     Dose/Rate Route Frequency Ordered Stop   07/30/23 1800  piperacillin -tazobactam (ZOSYN ) IVPB 3.375 g  3.375 g 12.5 mL/hr over 240 Minutes Intravenous Every 8 hours 07/30/23  1332     07/30/23 1400  piperacillin -tazobactam (ZOSYN ) IVPB 3.375 g  Status:  Discontinued        3.375 g 100 mL/hr over 30 Minutes Intravenous Every 8 hours 07/30/23 1329 07/30/23 1332   07/30/23 1015  piperacillin -tazobactam (ZOSYN ) IVPB 3.375 g        3.375 g 100 mL/hr over 30 Minutes Intravenous  Once 07/30/23 1013 07/30/23 1311       Subjective:  Abdominal pain is controlled, has some burping and belching.  Objective: Vitals:   08/04/23 0600 08/04/23 0800  BP:  130/67  Pulse:  81  Resp: 18 15  Temp:  98.2 F (36.8 C)  SpO2:      Intake/Output Summary (Last 24 hours) at 08/04/2023 1123 Last data filed at 08/04/2023 1100 Gross per 24 hour  Intake 3131.69 ml  Output --  Net 3131.69 ml    Filed Weights   07/30/23 1405 07/30/23 1930 07/31/23 0300  Weight: 57.2 kg 60 kg 61.8 kg   Body mass index is 23.39 kg/m.   Physical Exam:  Awake Alert, Oriented X 3, No new F.N deficits, Normal affect Symmetrical Chest wall movement, Good air movement bilaterally, CTAB RRR,No Gallops,Rubs or new Murmurs, No Parasternal Heave +ve B.Sounds, ostomy present, midline surgical wound bandaged  no Cyanosis, Clubbing ,trace  edema.   Data Review: I have personally reviewed the following laboratory data and studies,  CBC: Recent Labs  Lab 07/29/23 0826 07/30/23 0936 07/30/23 1724 07/31/23 0257 08/01/23 0354 08/02/23 0412 08/03/23 0603 08/04/23 0731  WBC 12.6* 8.8   < > 9.1 11.0* 14.1* 10.1 9.8  NEUTROABS 10.8* 7.9*  --   --   --   --   --   --   HGB 11.6* 12.9   < > 9.1* 9.1* 10.2* 9.9* 10.1*  HCT 34.5* 37.4   < > 28.4* 27.5* 30.9* 29.5* 30.5*  MCV 84.8 84.8   < > 89.6 87.3 86.3 86.0 86.6  PLT 408* 344   < > 226 274 325 340 371   < > = values in this interval not displayed.   Basic Metabolic Panel: Recent Labs  Lab 07/31/23 0257 08/01/23 0354 08/02/23 0412 08/03/23 0603 08/04/23 0731  NA 134* 134* 134* 134* 134*  K 4.0 3.4* 3.9 3.5 3.7  CL 104 100 103 102 104   CO2 17* 24 20* 25 21*  GLUCOSE 83 73 72 97 126*  BUN 22 29* 34* 24* 15  CREATININE 0.66 0.85 0.80 0.65 0.50  CALCIUM  7.5* 8.4* 8.1* 8.0* 7.9*  MG  --   --  1.8  --  1.5*  PHOS  --   --  2.7  --  2.6   Liver Function Tests: Recent Labs  Lab 07/29/23 0813 07/30/23 0936 07/31/23 0257  AST 23 23 16   ALT 12 14 10   ALKPHOS 105 60 25*  BILITOT 0.5 0.6 0.7  PROT 7.0 6.1* 3.6*  ALBUMIN  3.5 2.8* 1.6*   Recent Labs  Lab 07/29/23 0813 07/30/23 0936  LIPASE <10* <10*   No results for input(s): AMMONIA in the last 168 hours. Cardiac Enzymes: No results for input(s): CKTOTAL, CKMB, CKMBINDEX, TROPONINI in the last 168 hours. BNP (last 3 results) No results for input(s): BNP in the last 8760 hours.  ProBNP (last 3 results) No results for input(s): PROBNP in the last 8760 hours.  CBG: No results for input(s): GLUCAP in  the last 168 hours. Recent Results (from the past 240 hours)  Resp panel by RT-PCR (RSV, Flu A&B, Covid) Anterior Nasal Swab     Status: None   Collection Time: 07/29/23  8:26 AM   Specimen: Anterior Nasal Swab  Result Value Ref Range Status   SARS Coronavirus 2 by RT PCR NEGATIVE NEGATIVE Final    Comment: (NOTE) SARS-CoV-2 target nucleic acids are NOT DETECTED.  The SARS-CoV-2 RNA is generally detectable in upper respiratory specimens during the acute phase of infection. The lowest concentration of SARS-CoV-2 viral copies this assay can detect is 138 copies/mL. A negative result does not preclude SARS-Cov-2 infection and should not be used as the sole basis for treatment or other patient management decisions. A negative result may occur with  improper specimen collection/handling, submission of specimen other than nasopharyngeal swab, presence of viral mutation(s) within the areas targeted by this assay, and inadequate number of viral copies(<138 copies/mL). A negative result must be combined with clinical observations, patient history, and  epidemiological information. The expected result is Negative.  Fact Sheet for Patients:  BloggerCourse.com  Fact Sheet for Healthcare Providers:  SeriousBroker.it  This test is no t yet approved or cleared by the [place]  FDA and  has been authorized for detection and/or diagnosis of SARS-CoV-2 by FDA under an Emergency Use Authorization (EUA). This EUA will remain  in effect (meaning this test can be used) for the duration of the COVID-19 declaration under Section 564(b)(1) of the Act, 21 U.S.C.section 360bbb-3(b)(1), unless the authorization is terminated  or revoked sooner.       Influenza A by PCR NEGATIVE NEGATIVE Final   Influenza B by PCR NEGATIVE NEGATIVE Final    Comment: (NOTE) The Xpert Xpress SARS-CoV-2/FLU/RSV plus assay is intended as an aid in the diagnosis of influenza from Nasopharyngeal swab specimens and should not be used as a sole basis for treatment. Nasal washings and aspirates are unacceptable for Xpert Xpress SARS-CoV-2/FLU/RSV testing.  Fact Sheet for Patients: BloggerCourse.com  Fact Sheet for Healthcare Providers: SeriousBroker.it  This test is not yet approved or cleared by the [place]  FDA and has been authorized for detection and/or diagnosis of SARS-CoV-2 by FDA under an Emergency Use Authorization (EUA). This EUA will remain in effect (meaning this test can be used) for the duration of the COVID-19 declaration under Section 564(b)(1) of the Act, 21 U.S.C. section 360bbb-3(b)(1), unless the authorization is terminated or revoked.     Resp Syncytial Virus by PCR NEGATIVE NEGATIVE Final    Comment: (NOTE) Fact Sheet for Patients: BloggerCourse.com  Fact Sheet for Healthcare Providers: SeriousBroker.it  This test is not yet approved or cleared by the [place]  FDA and has been  authorized for detection and/or diagnosis of SARS-CoV-2 by FDA under an Emergency Use Authorization (EUA). This EUA will remain in effect (meaning this test can be used) for the duration of the COVID-19 declaration under Section 564(b)(1) of the Act, 21 U.S.C. section 360bbb-3(b)(1), unless the authorization is terminated or revoked.  Performed at [facility], [address] [place], [place] [address]   Blood Culture (routine x 2)     Status: None (Preliminary result)   Collection Time: 07/29/23  8:29 AM   Specimen: BLOOD  Result Value Ref Range Status   Specimen Description   Final    BLOOD RIGHT ANTECUBITAL Performed at [facility], [address] [place], [place] [address]    Special Requests   Final    BOTTLES  DRAWN AEROBIC AND ANAEROBIC Blood Culture adequate volume Performed at [facility], [address] [place], [place] [address]    Culture  Setup Time   Final    GRAM POSITIVE COCCI IN CHAINS ANAEROBIC BOTTLE ONLY CRITICAL RESULT CALLED TO, READ BACK BY AND VERIFIED WITH: PHARMD HEATHER WILSON ON 08/01/23 @ 1250 BY DRT    Culture   Final    CULTURE REINCUBATED FOR BETTER GROWTH Performed at [facility], [address], [place], [place] [address]    Report Status PENDING  Incomplete  Blood Culture ID Panel (Reflexed)     Status: None   Collection Time: 07/29/23  8:29 AM  Result Value Ref Range Status   Enterococcus faecalis NOT DETECTED NOT DETECTED Final   Enterococcus Faecium NOT DETECTED NOT DETECTED Final   Listeria monocytogenes NOT DETECTED NOT DETECTED Final   Staphylococcus species NOT DETECTED NOT DETECTED Final   Staphylococcus aureus (BCID) NOT DETECTED NOT DETECTED Final   Staphylococcus epidermidis NOT DETECTED NOT DETECTED Final   Staphylococcus lugdunensis NOT DETECTED NOT DETECTED Final   Streptococcus species NOT DETECTED NOT DETECTED Final   Streptococcus agalactiae NOT DETECTED  NOT DETECTED Final   Streptococcus pneumoniae NOT DETECTED NOT DETECTED Final   Streptococcus pyogenes NOT DETECTED NOT DETECTED Final   A.calcoaceticus-baumannii NOT DETECTED NOT DETECTED Final   Bacteroides fragilis NOT DETECTED NOT DETECTED Final   Enterobacterales NOT DETECTED NOT DETECTED Final   Enterobacter cloacae complex NOT DETECTED NOT DETECTED Final   Escherichia coli NOT DETECTED NOT DETECTED Final   Klebsiella aerogenes NOT DETECTED NOT DETECTED Final   Klebsiella oxytoca NOT DETECTED NOT DETECTED Final   Klebsiella pneumoniae NOT DETECTED NOT DETECTED Final   Proteus species NOT DETECTED NOT DETECTED Final   Salmonella species NOT DETECTED NOT DETECTED Final   Serratia marcescens NOT DETECTED NOT DETECTED Final   Haemophilus influenzae NOT DETECTED NOT DETECTED Final   Neisseria meningitidis NOT DETECTED NOT DETECTED Final   Pseudomonas aeruginosa NOT DETECTED NOT DETECTED Final   Stenotrophomonas maltophilia NOT DETECTED NOT DETECTED Final   Candida albicans NOT DETECTED NOT DETECTED Final   Candida auris NOT DETECTED NOT DETECTED Final   Candida glabrata NOT DETECTED NOT DETECTED Final   Candida krusei NOT DETECTED NOT DETECTED Final   Candida parapsilosis NOT DETECTED NOT DETECTED Final   Candida tropicalis NOT DETECTED NOT DETECTED Final   Cryptococcus neoformans/gattii NOT DETECTED NOT DETECTED Final    Comment: Performed at [facility], [address], [place], [place] [address]  Blood Culture (routine x 2)     Status: None   Collection Time: 07/29/23  8:31 AM   Specimen: BLOOD RIGHT FOREARM  Result Value Ref Range Status   Specimen Description   Final    BLOOD RIGHT FOREARM Performed at [facility], [address], [place], [place] [address]    Special Requests   Final    BOTTLES DRAWN AEROBIC AND ANAEROBIC Blood Culture adequate volume Performed at [facility], [address] [place], [place] [address]    Culture   Final     NO GROWTH 5 DAYS Performed at [facility], [address], [place], [place] [address]    Report Status 08/03/2023 FINAL  Final  Urine Culture     Status: None   Collection Time: 07/30/23  9:36 AM   Specimen: Urine, Clean Catch  Result Value Ref Range Status   Specimen Description   Final  URINE, CLEAN CATCH Performed at [facility], [address] [place], [place] [address]    Special Requests   Final    NONE Performed at [facility], [address] [place], [place] [address]    Culture   Final    NO GROWTH Performed at [facility], [address] [place], [place] [address]    Report Status 07/31/2023 FINAL  Final  Culture, blood (Routine X 2) w Reflex to ID Panel     Status: None   Collection Time: 07/30/23 11:00 AM   Specimen: BLOOD  Result Value Ref Range Status   Specimen Description   Final    BLOOD LEFT ANTECUBITAL Performed at [facility], [address], [place], [place] [address]    Special Requests   Final    BOTTLES DRAWN AEROBIC AND ANAEROBIC Blood Culture results may not be optimal due to an inadequate volume of blood received in culture bottles Performed at [facility], [address] [place], [place] [address]    Culture   Final    NO GROWTH 5 DAYS Performed at [facility], [address] [place], [place] [address]    Report Status 08/04/2023 FINAL  Final  Culture, blood (Routine X 2) w Reflex to ID Panel     Status: None   Collection Time: 07/30/23 11:01 AM   Specimen: BLOOD  Result Value Ref Range Status   Specimen Description   Final    BLOOD RIGHT ANTECUBITAL Performed at [facility], [address], [place], [place] [address]    Special Requests   Final    BOTTLES DRAWN AEROBIC AND ANAEROBIC Blood Culture results may not be optimal due to an inadequate volume of blood received in culture bottles Performed at [facility], [address], [place], [place] [address]    Culture   Final    NO GROWTH 5 DAYS Performed at [facility], [address] [place], [place] [address]    Report Status 08/04/2023 FINAL  Final  MRSA Next Gen by PCR, Nasal     Status: None   Collection Time: 07/30/23  8:38 PM   Specimen: Nasal Mucosa; Nasal Swab  Result Value Ref Range Status   MRSA by PCR Next Gen NOT DETECTED NOT DETECTED Final    Comment: (NOTE) The GeneXpert MRSA Assay (FDA approved for NASAL specimens only), is one component of a comprehensive MRSA colonization surveillance program. It is not intended to diagnose MRSA infection nor to guide or monitor treatment for MRSA infections. Test performance is not FDA approved in patients less than 79 years old. Performed at [facility], [address], [place], [place] [address]      Studies: DG Abd Portable 1V Result Date: 08/03/2023 CLINICAL DATA:  Distension EXAM: PORTABLE ABDOMEN - 1 VIEW COMPARISON:  CT 07/30/2023 FINDINGS: Interval left lower quadrant ostomy. Mildly dilated gas-filled bowel in the left lower quadrant measuring up to 3.6 cm. Moderate to large stool burden. IMPRESSION: Interval left lower quadrant ostomy. Mildly dilated gas-filled bowel in the left lower quadrant, possible mild postoperative ileus. Moderate to large stool burden. Electronically Signed   By: Luke Bun M.D.   On: 08/03/2023 14:52       Brayton Lye, MD  [facility] 08/04/2023  If 7PM-7AM, please contact night-coverage

## 2023-08-04 NOTE — Plan of Care (Signed)

## 2023-08-04 NOTE — TOC Progression Note (Signed)
 Transition of Care [facility]) - Progression Note    Patient Details  Name: Angela [name] MRN: [id number] Date of Birth: August 17, 1950  Transition of Care [facility]) CM/SW Contact  Hendricks KANDICE Her, RN Phone Number: 08/04/2023, 3:57 PM  Clinical Narrative:      RNCM has ordered a BSC and a RW to be delivered bedside from [facility] will provide home health PT and OT. RNC Mspoke with Daughter regarding above.   TOC will continue to follow patient for any additional discharge needs            Expected Discharge Plan and Services                                               Social Determinants of Health (SDOH) Interventions SDOH Screenings   Food Insecurity: Patient Declined (08/02/2023)  Housing: Unknown (08/02/2023)  Transportation Needs: Patient Declined (08/02/2023)  Utilities: Patient Declined (08/02/2023)  Alcohol Screen: Low Risk  (04/19/2023)  Depression (PHQ2-9): Low Risk  (06/23/2023)  Financial Resource Strain: Patient Declined (04/19/2023)  Physical Activity: Sufficiently Active (04/19/2023)  Social Connections: Patient Declined (08/02/2023)  Stress: No Stress Concern Present (04/19/2023)  Tobacco Use: Medium Risk (07/30/2023)  Health Literacy: Adequate Health Literacy (04/19/2023)    Readmission Risk Interventions     No data to display

## 2023-08-04 NOTE — Progress Notes (Signed)
 Patient ID: Angela [name], female   DOB: 12/04/1950, 73 y.o.   MRN: [id number]   Acute Care Surgery Service Progress Note:    Chief Complaint/Subjective: Some burping with PO intake, still feels bloating. Ostomy appliance with stool and gas.  Objective: Vital signs in last 24 hours: Temp:  [98 F (36.7 C)-98.3 F (36.8 C)] 98.2 F (36.8 C) (06/27 0800) Pulse Rate:  [78-96] 81 (06/27 0800) Resp:  [15-20] 15 (06/27 0800) BP: (117-153)/(66-136) 130/67 (06/27 0800) SpO2:  [98 %-100 %] 100 % (06/27 0501) Last BM Date :  (PTA)  Intake/Output from previous day: 06/26 0701 - 06/27 0700 In: 240 [P.O.:240] Out: -  Intake/Output this shift: No intake/output data recorded.  Lungs:  nonlabored  Cardiovascular: reg  Abd: Ostomy is viable. Stool and air in bag.  Midline dressing is intact.  Soft. Moderately distended  Extremities: no edema, +SCDs  Neuro: alert, nonfocal  Lab Results: CBC  Recent Labs    08/03/23 0603 08/04/23 0731  WBC 10.1 9.8  HGB 9.9* 10.1*  HCT 29.5* 30.5*  PLT 340 371   BMET Recent Labs    08/03/23 0603 08/04/23 0731  NA 134* 134*  K 3.5 3.7  CL 102 104  CO2 25 21*  GLUCOSE 97 126*  BUN 24* 15  CREATININE 0.65 0.50  CALCIUM  8.0* 7.9*   LFT    Latest Ref Rng & Units 07/31/2023    2:57 AM 07/30/2023    9:36 AM 07/29/2023    8:13 AM  Hepatic Function  Total Protein 6.5 - 8.1 g/dL 3.6  6.1  7.0   Albumin  3.5 - 5.0 g/dL 1.6  2.8  3.5   AST 15 - 41 U/L 16  23  23    ALT 0 - 44 U/L 10  14  12    Alk Phosphatase 38 - 126 U/L 25  60  105   Total Bilirubin 0.0 - 1.2 mg/dL 0.7  0.6  0.5    PT/INR No results for input(s): LABPROT, INR in the last 72 hours. ABG No results for input(s): PHART, HCO3 in the last 72 hours.  Invalid input(s): PCO2, PO2  Studies/Results:  Anti-infectives: Anti-infectives (From admission, onward)    Start     Dose/Rate Route Frequency Ordered Stop   07/30/23 1800  piperacillin -tazobactam (ZOSYN ) IVPB  3.375 g        3.375 g 12.5 mL/hr over 240 Minutes Intravenous Every 8 hours 07/30/23 1332     07/30/23 1400  piperacillin -tazobactam (ZOSYN ) IVPB 3.375 g  Status:  Discontinued        3.375 g 100 mL/hr over 30 Minutes Intravenous Every 8 hours 07/30/23 1329 07/30/23 1332   07/30/23 1015  piperacillin -tazobactam (ZOSYN ) IVPB 3.375 g        3.375 g 100 mL/hr over 30 Minutes Intravenous  Once 07/30/23 1013 07/30/23 1311       Medications: Scheduled Meds:  acetaminophen   1,000 mg Oral Q6H   Chlorhexidine  Gluconate Cloth  6 each Topical Daily   enoxaparin  (LOVENOX ) injection  40 mg Subcutaneous Q24H   feeding supplement  1 Container Oral TID BM   latanoprost   1 drop Both Eyes QHS   methocarbamol   500 mg Oral TID   Or   methocarbamol  (ROBAXIN ) injection  500 mg Intravenous TID   sodium chloride  flush  3 mL Intravenous Q12H   timolol   1 drop Both Eyes BID   Continuous Infusions:  sodium chloride  100 mL/hr at 08/02/23 1054   magnesium  sulfate bolus IVPB     piperacillin -tazobactam 3.375 g (08/04/23 0918)   potassium PHOSPHATE IVPB (in mmol)     PRN Meds:.albuterol , HYDROmorphone  (DILAUDID ) injection, melatonin, metoprolol  tartrate, ondansetron  **OR** ondansetron  (ZOFRAN ) IV, mouth rinse, oxyCODONE   Assessment/Plan: Patient Active Problem List   Diagnosis Date Noted   Bowel perforation (HCC) 07/30/2023   Hydroureteronephrosis 07/30/2023   Metabolic acidosis with increased anion gap and accumulation of organic acids 07/30/2023   Hyponatremia 07/30/2023   Glaucoma 07/30/2023   Perforated sigmoid colon (HCC) 07/30/2023   Chronic idiopathic constipation 06/25/2020   Hematochezia 06/25/2020   Hyperlipidemia 06/25/2020   Elevated hemoglobin A1c 01/22/2020   Leukocytosis 01/22/2020   Essential hypertension 06/19/2019   Abnormal mammogram 05/15/2017   POD 5, s/p ex lap with Hartmann's procedure for stercoral colitis with ischemia and perforation with feculent peritonitis, Dr.  Polly 6/22   -CLD -Will not advance given still some distension and burping. -Abdominal x-ray June 26 shows some air in the stomach, small bowel distention up to about 3-1/2 cm.  She does have some stool burden. -await return of bowel function, aggressive electrolyte replacement K > 5, Mg > 2, Phos > 3 -mobilize -spontaneously voiding -Afebrile, white blood cell count trending down -BID dressing changes to midline wound -WOC following -continue ostomy education and wound teaching -pulm toilet  -d/w patient with RN, and primary service   FEN -clears with protein shakes  VTE - lovenox  ID - zosyn  x 5 days post op (complete course today)   Nephrolithiasis - per medicine Chronic constipation - will need aggressive bowel regimen when bowel function returns HTN GLaucoma Anemia - hgb stable Disposition:  LOS: 5 days    Orie Silversmith, MD [facility]

## 2023-08-04 NOTE — Progress Notes (Signed)
 Physical Therapy Treatment Patient Details Name: Angela [name] MRN: [id number] DOB: [birth year] Today's Date: 08/04/2023   History of Present Illness ALIANNAH [name] is a 73 y.o. female who presents with abdominal pain and bloating  Renal CT revealed interval development of intraperitoneal free air and moderate volume free fluid containing layering hyperdensities consistent with bowel perforation, distal left ureteral stone measuring 9 mm with interval decrease left hydroureteronephrosis. . Past medical history significant of hypertension, hyperlipidemia, glaucoma and prior tobacco abuse    PT Comments  Pt tolerated treatment well today. Pt was able to navigate stairs with CGA. No change in DC/DME recs at this time. PT will continue to follow.     If plan is discharge home, recommend the following: A little help with walking and/or transfers;A little help with bathing/dressing/bathroom;Assistance with cooking/housework;Direct supervision/assist for medications management;Assist for transportation;Help with stairs or ramp for entrance   Can travel by private vehicle        Equipment Recommendations  Rolling walker (2 wheels)    Recommendations for Other Services       Precautions / Restrictions Precautions Precautions: Fall Recall of Precautions/Restrictions: Intact Restrictions Weight Bearing Restrictions Per Provider Order: No     Mobility  Bed Mobility Overal bed mobility: Needs Assistance Bed Mobility: Supine to Sit     Supine to sit: Min assist     General bed mobility comments: Requesting HHA for trunk elevation.    Transfers Overall transfer level: Needs assistance Equipment used: Rolling walker (2 wheels) Transfers: Sit to/from Stand Sit to Stand: Supervision           General transfer comment: Cues for hand placement.    Ambulation/Gait Ambulation/Gait assistance: Supervision, +2 safety/equipment (Chair follow) Gait Distance (Feet): 200 Feet Assistive  device: Rolling walker (2 wheels) Gait Pattern/deviations: Decreased stride length, Step-through pattern Gait velocity: decreased     General Gait Details: Slowed step through pattern. Constan cues for proximity to RW. Chair follow provided but ultimately not needed.   Stairs Stairs: Yes Stairs assistance: Contact guard assist Stair Management: Two rails, Alternating pattern, Forwards Number of Stairs: 3 General stair comments: no LOB noted.   Wheelchair Mobility     Tilt Bed    Modified Rankin (Stroke Patients Only)       Balance Overall balance assessment: Needs assistance Sitting-balance support: Bilateral upper extremity supported, Feet supported Sitting balance-Leahy Scale: Good     Standing balance support: Bilateral upper extremity supported, During functional activity Standing balance-Leahy Scale: Fair                              Hotel manager: No apparent difficulties  Cognition Arousal: Alert Behavior During Therapy: Flat affect                           PT - Cognition Comments: Very flat affect. Following commands: Intact      Cueing Cueing Techniques: Verbal cues, Tactile cues  Exercises      General Comments General comments (skin integrity, edema, etc.): VSS      Pertinent Vitals/Pain Pain Assessment Pain Assessment: Faces Faces Pain Scale: Hurts a little bit Pain Location: Abdomen Pain Descriptors / Indicators: Sore Pain Intervention(s): Monitored during session    Home Living                          Prior  Function            PT Goals (current goals can now be found in the care plan section) Progress towards PT goals: Progressing toward goals    Frequency    Min 3X/week      PT Plan      Co-evaluation              AM-PAC PT 6 Clicks Mobility   Outcome Measure  Help needed turning from your back to your side while in a flat bed without using  bedrails?: A Little Help needed moving from lying on your back to sitting on the side of a flat bed without using bedrails?: A Little Help needed moving to and from a bed to a chair (including a wheelchair)?: A Little Help needed standing up from a chair using your arms (e.g., wheelchair or bedside chair)?: A Little Help needed to walk in hospital room?: A Little Help needed climbing 3-5 steps with a railing? : A Little 6 Click Score: 18    End of Session Equipment Utilized During Treatment:  (Gait belt deferred due to new colostomy bag) Activity Tolerance: Patient tolerated treatment well Patient left: with call bell/phone within reach;in chair;with chair alarm set;with family/visitor present Nurse Communication: Mobility status PT Visit Diagnosis: Other abnormalities of gait and mobility (R26.89)     Time: 9058-9040 PT Time Calculation (min) (ACUTE ONLY): 18 min  Charges:    $Gait Training: 8-22 mins PT General Charges $$ ACUTE PT VISIT: 1 Visit                     Makenley Shimp B, PT, DPT Acute Rehab Services [phone]    Osric Klopf 08/04/2023, 10:14 AM

## 2023-08-04 NOTE — Progress Notes (Signed)
 Occupational Therapy Treatment Patient Details Name: Angela [name] MRN: [id number] DOB: Jan 09, 1951 Today's Date: 08/04/2023   History of present illness Angela [name] is a 73 y.o. female who presents with abdominal pain and bloating  Renal CT revealed interval development of intraperitoneal free air and moderate volume free fluid containing layering hyperdensities consistent with bowel perforation, distal left ureteral stone measuring 9 mm with interval decrease left hydroureteronephrosis. . Past medical history significant of hypertension, hyperlipidemia, glaucoma and prior tobacco abuse   OT comments  Pt encouraged by family to get OOB to recliner, she was receptive to mobility. Pt ambulating around room and in bathroom to completing toileting with CGA to supervision. Pt demonstrating improvements in mobility today compared to her IE. She is moving more towards supervision level for OOB mobility. OT to continue to progress pt as able. DC plans remain appropriate for [facility].       If plan is discharge home, recommend the following:  A little help with bathing/dressing/bathroom;Assistance with cooking/housework   Equipment Recommendations  Tub/shower seat    Recommendations for Other Services      Precautions / Restrictions Precautions Precautions: Fall Recall of Precautions/Restrictions: Intact Restrictions Weight Bearing Restrictions Per Provider Order: No       Mobility Bed Mobility Overal bed mobility: Needs Assistance Bed Mobility: Supine to Sit     Supine to sit: Min assist     General bed mobility comments: min A trunk elevation.    Transfers Overall transfer level: Needs assistance Equipment used: Rolling walker (2 wheels) Transfers: Sit to/from Stand, Bed to chair/wheelchair/BSC Sit to Stand: Supervision     Step pivot transfers: Contact guard assist           Balance Overall balance assessment: Needs assistance Sitting-balance support: Bilateral upper  extremity supported, Feet supported Sitting balance-Leahy Scale: Good     Standing balance support: Bilateral upper extremity supported, During functional activity Standing balance-Leahy Scale: Fair                             ADL either performed or assessed with clinical judgement   ADL Overall ADL's : Needs assistance/impaired     Grooming: Standing;Wash/dry face;Supervision/safety                   Toilet Transfer: Contact guard assist;Rolling walker (2 wheels);Ambulation;Comfort height toilet   Toileting- Clothing Manipulation and Hygiene: Sit to/from stand;Supervision/safety       Functional mobility during ADLs: Rolling walker (2 wheels);Supervision/safety      Extremity/Trunk Assessment Upper Extremity Assessment Upper Extremity Assessment: Generalized weakness   Lower Extremity Assessment Lower Extremity Assessment: Generalized weakness   Cervical / Trunk Assessment Cervical / Trunk Assessment: Normal    Vision       Perception     Praxis     Communication Communication Communication: No apparent difficulties   Cognition Arousal: Alert Behavior During Therapy: Flat affect Cognition: No apparent impairments                               Following commands: Intact        Cueing   Cueing Techniques: Verbal cues, Tactile cues  Exercises      Shoulder Instructions       General Comments VSS; pt nephew in room and encouraging pt to sit in chair.    Pertinent Vitals/ Pain  Pain Assessment Pain Assessment: Faces Faces Pain Scale: Hurts a little bit Pain Location: Abdomen Pain Descriptors / Indicators: Sore Pain Intervention(s): Monitored during session, Limited activity within patient's tolerance  Home Living                                          Prior Functioning/Environment              Frequency  Min 2X/week        Progress Toward Goals  OT Goals(current goals can  now be found in the care plan section)  Progress towards OT goals: Progressing toward goals  Acute Rehab OT Goals Patient Stated Goal: To return home; get pain down OT Goal Formulation: With patient Time For Goal Achievement: 08/16/23 Potential to Achieve Goals: Good  Plan      Co-evaluation                 AM-PAC OT 6 Clicks Daily Activity     Outcome Measure   Help from another person eating meals?: None Help from another person taking care of personal grooming?: A Little Help from another person toileting, which includes using toliet, bedpan, or urinal?: A Little Help from another person bathing (including washing, rinsing, drying)?: A Little Help from another person to put on and taking off regular upper body clothing?: A Little Help from another person to put on and taking off regular lower body clothing?: A Little 6 Click Score: 19    End of Session Equipment Utilized During Treatment: Rolling walker (2 wheels);Gait belt  OT Visit Diagnosis: Unsteadiness on feet (R26.81);Other abnormalities of gait and mobility (R26.89);Pain;Muscle weakness (generalized) (M62.81) Pain - part of body:  (abdomen)   Activity Tolerance Patient tolerated treatment well   Patient Left with call bell/phone within reach;with family/visitor present;in chair   Nurse Communication Mobility status        Time: 8775-8765 OT Time Calculation (min): 10 min  Charges: OT General Charges $OT Visit: 1 Visit OT Treatments $Self Care/Home Management : 8-22 mins  08/04/2023  AB, OTR/L  Acute Rehabilitation Services  Office: [phone]   Angela [name] 08/04/2023, 4:02 PM

## 2023-08-05 DIAGNOSIS — K631 Perforation of intestine (nontraumatic): Secondary | ICD-10-CM | POA: Diagnosis not present

## 2023-08-05 LAB — CBC
HCT: 29.5 % — ABNORMAL LOW (ref 36.0–46.0)
Hemoglobin: 9.8 g/dL — ABNORMAL LOW (ref 12.0–15.0)
MCH: 28.4 pg (ref 26.0–34.0)
MCHC: 33.2 g/dL (ref 30.0–36.0)
MCV: 85.5 fL (ref 80.0–100.0)
Platelets: 449 10*3/uL — ABNORMAL HIGH (ref 150–400)
RBC: 3.45 MIL/uL — ABNORMAL LOW (ref 3.87–5.11)
RDW: 14.2 % (ref 11.5–15.5)
WBC: 11.8 10*3/uL — ABNORMAL HIGH (ref 4.0–10.5)
nRBC: 0.3 % — ABNORMAL HIGH (ref 0.0–0.2)

## 2023-08-05 LAB — BASIC METABOLIC PANEL WITH GFR
Anion gap: 10 (ref 5–15)
BUN: 14 mg/dL (ref 8–23)
CO2: 25 mmol/L (ref 22–32)
Calcium: 7.9 mg/dL — ABNORMAL LOW (ref 8.9–10.3)
Chloride: 102 mmol/L (ref 98–111)
Creatinine, Ser: 0.5 mg/dL (ref 0.44–1.00)
GFR, Estimated: >60 mL/min (ref >60)
Glucose, Bld: 115 mg/dL — ABNORMAL HIGH (ref 70–99)
Potassium: 3.7 mmol/L (ref 3.5–5.1)
Sodium: 137 mmol/L (ref 135–145)

## 2023-08-05 LAB — BRAIN NATRIURETIC PEPTIDE: B Natriuretic Peptide: 203.7 pg/mL — ABNORMAL HIGH (ref 0.0–100.0)

## 2023-08-05 MED ORDER — POTASSIUM CHLORIDE CRYS ER 20 MEQ PO TBCR
40.0000 meq | EXTENDED_RELEASE_TABLET | Freq: Once | ORAL | Status: AC
  Administered 2023-08-05: 40 meq via ORAL
  Filled 2023-08-05: qty 2

## 2023-08-05 MED ORDER — CALCIUM CARBONATE ANTACID 500 MG PO CHEW
800.0000 mg | CHEWABLE_TABLET | ORAL | Status: AC
  Administered 2023-08-05: 800 mg via ORAL
  Filled 2023-08-05: qty 4

## 2023-08-05 NOTE — Progress Notes (Signed)
    Assessment & Plan:   POD#6 - s/p ex lap with Hartmann's procedure for stercoral colitis with ischemia and perforation with feculent peritonitis, Dr. Polly 6/22   - increased ostomy output, no nausea - advance to full liquid diet - OOB, ambulating in halls - BID dressing changes to midline wound - WOC following - continue ostomy education and wound teaching - WBC 11 - monitor (off abx's)   FEN - full liquid diet VTE - lovenox  ID - [email or web address]  x 5 days post op, completed   Nephrolithiasis - per medicine Chronic constipation - will need aggressive bowel regimen when bowel function returns HTN GLaucoma Anemia - hgb stable        [name], MD [facility] A [facility] practice Office: [phone]        Chief Complaint: Colonic perforation  Subjective: Patient in bed, family at bedside.  Ambulated in hall this AM.  Does not like clear liquids, denies nausea.  Objective: Vital signs in last 24 hours: Temp:  [97.8 F (36.6 C)-99.1 F (37.3 C)] 98.2 F (36.8 C) (06/28 0328) Pulse Rate:  [80-88] 88 (06/28 0005) Resp:  [16-20] 20 (06/28 0600) BP: (132-154)/(75-92) 154/83 (06/28 0328) SpO2:  [96 %-98 %] 96 % (06/28 0005) Last BM Date : 08/04/23  Intake/Output from previous day: 06/27 0701 - 06/28 0700 In: 3724.4 [P.O.:360; I.V.:2530.6; IV Piggyback:833.8] Out: 30 [Stool:30] Intake/Output this shift: No intake/output data recorded.  Physical Exam: HEENT - sclerae clear, mucous membranes moist Abdomen - mild distension; midline dressing dry and intact; ostomy with moderate succus in bag  Lab Results:  Recent Labs    08/04/23 0731 08/05/23 0741  WBC 9.8 11.8*  HGB 10.1* 9.8*  HCT 30.5* 29.5*  PLT 371 449*   BMET Recent Labs    08/04/23 0731 08/05/23 0741  NA 134* 137  K 3.7 3.7  CL 104 102  CO2 21* 25  GLUCOSE 126* 115*  BUN 15 14  CREATININE 0.50 0.50  CALCIUM  7.9* 7.9*   PT/INR No results for input(s): LABPROT, INR in the last 72  hours. Comprehensive Metabolic Panel:    Component Value Date/Time   NA 137 08/05/2023 0741   NA 134 (L) 08/04/2023 0731   NA 140 11/10/2007 0000   K 3.7 08/05/2023 0741   K 3.7 08/04/2023 0731   CL 102 08/05/2023 0741   CL 104 08/04/2023 0731   CO2 25 08/05/2023 0741   CO2 21 (L) 08/04/2023 0731   BUN 14 08/05/2023 0741   BUN 15 08/04/2023 0731   BUN 10 11/10/2007 0000   CREATININE 0.50 08/05/2023 0741   CREATININE 0.50 08/04/2023 0731   CREATININE 0.74 06/26/2020 1522   GLUCOSE 115 (H) 08/05/2023 0741   GLUCOSE 126 (H) 08/04/2023 0731   CALCIUM  7.9 (L) 08/05/2023 0741   CALCIUM  7.9 (L) 08/04/2023 0731   AST 16 07/31/2023 0257   AST 23 07/30/2023 0936   ALT 10 07/31/2023 0257   ALT 14 07/30/2023 0936   ALKPHOS 25 (L) 07/31/2023 0257   ALKPHOS 60 07/30/2023 0936   BILITOT 0.7 07/31/2023 0257   BILITOT 0.6 07/30/2023 0936   PROT 3.6 (L) 07/31/2023 0257   PROT 6.1 (L) 07/30/2023 0936   ALBUMIN  1.6 (L) 07/31/2023 0257   ALBUMIN  2.8 (L) 07/30/2023 0936    Studies/Results: No results found.    [name] 08/05/2023  Patient ID: Angela [name] Angela [name], female   DOB: Aug 18, 1950, 73 y.o.   MRN: [id number]

## 2023-08-05 NOTE — Progress Notes (Signed)
 PROGRESS NOTE  GAGANDEEP KOSSMAN FMW:[id number] DOB: 1950-03-11 DOA: 07/30/2023 PCP: Catherine Charlies LABOR, DO   LOS: 6 days   Brief narrative:   Angela [name] is a 73 y.o. female with medical history significant o hypertension, hyperlipidemia, glaucoma and prior tobacco abuse who presents with abdominal pain and bloating. CT abdomen pelvis, which  mild left hydroureteronephrosis and large volume stool burden with stercoral colitis.   Admission was recommended due to concern for sepsis from intra-abdominal infection, but patient declined for which she was prescribed Augmentin  and discharged home.  She presents back with worsening pain, repeat renal CT revealed interval development of intraperitoneal free air and moderate volume free fluid containing layering hyperdensities consistent with bowel perforation, distal left ureteral stone measuring 9 mm with interval decrease left hydroureteronephrosis.  Patient went to surgery for bowel perforation, please see discussion below.    Assessment/Plan: Principal Problem:   Bowel perforation (HCC) Active Problems:   Hydroureteronephrosis   Chronic idiopathic constipation   Metabolic acidosis with increased anion gap and accumulation of organic acids   Hyponatremia   Essential hypertension   Glaucoma   Perforated sigmoid colon (HCC)    Sepsis secondary to bowel perforation  -CT of the abdomen pelvis noting bowel perforation.  Status post exploratory laparotomy with Hartmann procedure for stercoral colitis with ischemia and perforation with feculent peritonitis on 07/30/2023.  - Completed 5 days of IV Zosyn . - Management per general surgery, clear liquid diet, advanced to full liquid today.   - He was encouraged to ambulate, use incentive spirometer. -Wound care to follow for colostomy needs and education. - Will start on Protonix given some reflux - Blood cell count up to 11.8 today, continue to monitor closely.  Postoperative ileus -Encouraged to  ambulate, minimize narcotics, replete electrolyte for target ofK > 5, Mg > 2, Phos > 3    Hydroureteronephrosis secondary to left ureteral stone   Status post explorative laparotomy.  Renal function and urine output normal.  Denies further pain. - Does appear to be having left kidney stone in distal ureter, but serial imaging including follow-up from for CT abdomen pelvis to CT renal protocol showing improvement, and renal ultrasound obtained 6/24 showing no further hydronephrosis.   Chronic idiopathic constipation Patient is a 31-month history of constipation with hemorrhoids.  CT noted large stool burden on presentation.  Currently status post surgery.  No colostomy output yet.   Metabolic acidosis with elevated anion gap Improved at this time.  Latest bicarbonate of 24.   Hyponatremia Sodium has improved to 134.   Essential hypertension Lisinopril  on hold. Blood pressure started to increase, so we will start on low-dose Coreg especially with heart rate mildly elevated.  Glaucoma Will resume timolol  drops.  DVT prophylaxis: enoxaparin  (LOVENOX ) injection 40 mg Start: 07/31/23 1000 SCDs Start: 07/30/23 1954 SCDs Start: 07/30/23 1953   Disposition: Uncertain at this time.  Likely need rehabilitation.  Status is: Inpatient Remains inpatient appropriate because: Pending clinical improvement, status post surgery, n.p.o. status, likely need for rehab medicine    Code Status:     Code Status: Full Code  Family Communication: Spoke with sister-in-law at bedside  Consultants: General Surgery  Procedures: Exploratory laparotomy with Hartmann procedure on 07/30/2023  Anti-infectives:  Zosyn  IV  Anti-infectives (From admission, onward)    Start     Dose/Rate Route Frequency Ordered Stop   07/30/23 1800  piperacillin -tazobactam (ZOSYN ) IVPB 3.375 g        3.375 g 12.5 mL/hr over 240  Minutes Intravenous Every 8 hours 07/30/23 1332 08/04/23 2359   07/30/23 1400   piperacillin -tazobactam (ZOSYN ) IVPB 3.375 g  Status:  Discontinued        3.375 g 100 mL/hr over 30 Minutes Intravenous Every 8 hours 07/30/23 1329 07/30/23 1332   07/30/23 1015  piperacillin -tazobactam (ZOSYN ) IVPB 3.375 g        3.375 g 100 mL/hr over 30 Minutes Intravenous  Once 07/30/23 1013 07/30/23 1311       Subjective:  She ambulated couple times this morning already, she still reports belching and reflux, she reports some abdominal pain, she has only been asking for Tylenol , I have asked her to take some oxycodone . Objective: Vitals:   08/05/23 0554 08/05/23 0600  BP:    Pulse:    Resp: 20 20  Temp:    SpO2:      Intake/Output Summary (Last 24 hours) at 08/05/2023 1254 Last data filed at 08/05/2023 0553 Gross per 24 hour  Intake 608.7 ml  Output 30 ml  Net 578.7 ml    Filed Weights   07/30/23 1405 07/30/23 1930 07/31/23 0300  Weight: 57.2 kg 60 kg 61.8 kg   Body mass index is 23.39 kg/m.   Physical Exam:  Awake Alert, Oriented X 3, No new F.N deficits, Normal affect Symmetrical Chest wall movement, Good air movement bilaterally, CTAB RRR,No Gallops,Rubs or new Murmurs, No Parasternal Heave +ve B.Sounds, ostomy present, midline surgical wound bandaged  no Cyanosis, Clubbing , +1 edema.   Data Review: I have personally reviewed the following laboratory data and studies,  CBC: Recent Labs  Lab 07/30/23 0936 07/30/23 1724 08/01/23 0354 08/02/23 0412 08/03/23 0603 08/04/23 0731 08/05/23 0741  WBC 8.8   < > 11.0* 14.1* 10.1 9.8 11.8*  NEUTROABS 7.9*  --   --   --   --   --   --   HGB 12.9   < > 9.1* 10.2* 9.9* 10.1* 9.8*  HCT 37.4   < > 27.5* 30.9* 29.5* 30.5* 29.5*  MCV 84.8   < > 87.3 86.3 86.0 86.6 85.5  PLT 344   < > 274 325 340 371 449*   < > = values in this interval not displayed.   Basic Metabolic Panel: Recent Labs  Lab 08/01/23 0354 08/02/23 0412 08/03/23 0603 08/04/23 0731 08/05/23 0741  NA 134* 134* 134* 134* 137  K 3.4* 3.9  3.5 3.7 3.7  CL 100 103 102 104 102  CO2 24 20* 25 21* 25  GLUCOSE 73 72 97 126* 115*  BUN 29* 34* 24* 15 14  CREATININE 0.85 0.80 0.65 0.50 0.50  CALCIUM  8.4* 8.1* 8.0* 7.9* 7.9*  MG  --  1.8  --  1.5*  --   PHOS  --  2.7  --  2.6  --    Liver Function Tests: Recent Labs  Lab 07/30/23 0936 07/31/23 0257  AST 23 16  ALT 14 10  ALKPHOS 60 25*  BILITOT 0.6 0.7  PROT 6.1* 3.6*  ALBUMIN  2.8* 1.6*   Recent Labs  Lab 07/30/23 0936  LIPASE <10*   No results for input(s): AMMONIA in the last 168 hours. Cardiac Enzymes: No results for input(s): CKTOTAL, CKMB, CKMBINDEX, TROPONINI in the last 168 hours. BNP (last 3 results) Recent Labs    08/05/23 0741  BNP 203.7*    ProBNP (last 3 results) No results for input(s): PROBNP in the last 8760 hours.  CBG: No results for input(s): GLUCAP in the last  168 hours. Recent Results (from the past 240 hours)  Resp panel by RT-PCR (RSV, Flu A&B, Covid) Anterior Nasal Swab     Status: None   Collection Time: 07/29/23  8:26 AM   Specimen: Anterior Nasal Swab  Result Value Ref Range Status   SARS Coronavirus 2 by RT PCR NEGATIVE NEGATIVE Final    Comment: (NOTE) SARS-CoV-2 target nucleic acids are NOT DETECTED.  The SARS-CoV-2 RNA is generally detectable in upper respiratory specimens during the acute phase of infection. The lowest concentration of SARS-CoV-2 viral copies this assay can detect is 138 copies/mL. A negative result does not preclude SARS-Cov-2 infection and should not be used as the sole basis for treatment or other patient management decisions. A negative result may occur with  improper specimen collection/handling, submission of specimen other than nasopharyngeal swab, presence of viral mutation(s) within the areas targeted by this assay, and inadequate number of viral copies(<138 copies/mL). A negative result must be combined with clinical observations, patient history, and  epidemiological information. The expected result is Negative.  Fact Sheet for Patients:  BloggerCourse.com  Fact Sheet for Healthcare Providers:  SeriousBroker.it  This test is no t yet approved or cleared by the [place]  FDA and  has been authorized for detection and/or diagnosis of SARS-CoV-2 by FDA under an Emergency Use Authorization (EUA). This EUA will remain  in effect (meaning this test can be used) for the duration of the COVID-19 declaration under Section 564(b)(1) of the Act, 21 U.S.C.section 360bbb-3(b)(1), unless the authorization is terminated  or revoked sooner.       Influenza A by PCR NEGATIVE NEGATIVE Final   Influenza B by PCR NEGATIVE NEGATIVE Final    Comment: (NOTE) The Xpert Xpress SARS-CoV-2/FLU/RSV plus assay is intended as an aid in the diagnosis of influenza from Nasopharyngeal swab specimens and should not be used as a sole basis for treatment. Nasal washings and aspirates are unacceptable for Xpert Xpress SARS-CoV-2/FLU/RSV testing.  Fact Sheet for Patients: BloggerCourse.com  Fact Sheet for Healthcare Providers: SeriousBroker.it  This test is not yet approved or cleared by the [place]  FDA and has been authorized for detection and/or diagnosis of SARS-CoV-2 by FDA under an Emergency Use Authorization (EUA). This EUA will remain in effect (meaning this test can be used) for the duration of the COVID-19 declaration under Section 564(b)(1) of the Act, 21 U.S.C. section 360bbb-3(b)(1), unless the authorization is terminated or revoked.     Resp Syncytial Virus by PCR NEGATIVE NEGATIVE Final    Comment: (NOTE) Fact Sheet for Patients: BloggerCourse.com  Fact Sheet for Healthcare Providers: SeriousBroker.it  This test is not yet approved or cleared by the [place]  FDA and has been  authorized for detection and/or diagnosis of SARS-CoV-2 by FDA under an Emergency Use Authorization (EUA). This EUA will remain in effect (meaning this test can be used) for the duration of the COVID-19 declaration under Section 564(b)(1) of the Act, 21 U.S.C. section 360bbb-3(b)(1), unless the authorization is terminated or revoked.  Performed at [facility], [address] [place], [place] [address]   Blood Culture (routine x 2)     Status: None (Preliminary result)   Collection Time: 07/29/23  8:29 AM   Specimen: BLOOD  Result Value Ref Range Status   Specimen Description   Final    BLOOD RIGHT ANTECUBITAL Performed at [facility], [address] [place], [place] [address]    Special Requests   Final    BOTTLES DRAWN AEROBIC  AND ANAEROBIC Blood Culture adequate volume Performed at [facility], [address], [place], [place] [address]    Culture  Setup Time   Final    GRAM POSITIVE COCCI IN CHAINS ANAEROBIC BOTTLE ONLY CRITICAL RESULT CALLED TO, READ BACK BY AND VERIFIED WITH: PHARMD HEATHER WILSON ON 08/01/23 @ 1250 BY DRT    Culture   Final    CULTURE REINCUBATED FOR BETTER GROWTH Performed at [facility], [address], [place], [place] [address]    Report Status PENDING  Incomplete  Blood Culture ID Panel (Reflexed)     Status: None   Collection Time: 07/29/23  8:29 AM  Result Value Ref Range Status   Enterococcus faecalis NOT DETECTED NOT DETECTED Final   Enterococcus Faecium NOT DETECTED NOT DETECTED Final   Listeria monocytogenes NOT DETECTED NOT DETECTED Final   Staphylococcus species NOT DETECTED NOT DETECTED Final   Staphylococcus aureus (BCID) NOT DETECTED NOT DETECTED Final   Staphylococcus epidermidis NOT DETECTED NOT DETECTED Final   Staphylococcus lugdunensis NOT DETECTED NOT DETECTED Final   Streptococcus species NOT DETECTED NOT DETECTED Final   Streptococcus agalactiae NOT DETECTED  NOT DETECTED Final   Streptococcus pneumoniae NOT DETECTED NOT DETECTED Final   Streptococcus pyogenes NOT DETECTED NOT DETECTED Final   A.calcoaceticus-baumannii NOT DETECTED NOT DETECTED Final   Bacteroides fragilis NOT DETECTED NOT DETECTED Final   Enterobacterales NOT DETECTED NOT DETECTED Final   Enterobacter cloacae complex NOT DETECTED NOT DETECTED Final   Escherichia coli NOT DETECTED NOT DETECTED Final   Klebsiella aerogenes NOT DETECTED NOT DETECTED Final   Klebsiella oxytoca NOT DETECTED NOT DETECTED Final   Klebsiella pneumoniae NOT DETECTED NOT DETECTED Final   Proteus species NOT DETECTED NOT DETECTED Final   Salmonella species NOT DETECTED NOT DETECTED Final   Serratia marcescens NOT DETECTED NOT DETECTED Final   Haemophilus influenzae NOT DETECTED NOT DETECTED Final   Neisseria meningitidis NOT DETECTED NOT DETECTED Final   Pseudomonas aeruginosa NOT DETECTED NOT DETECTED Final   Stenotrophomonas maltophilia NOT DETECTED NOT DETECTED Final   Candida albicans NOT DETECTED NOT DETECTED Final   Candida auris NOT DETECTED NOT DETECTED Final   Candida glabrata NOT DETECTED NOT DETECTED Final   Candida krusei NOT DETECTED NOT DETECTED Final   Candida parapsilosis NOT DETECTED NOT DETECTED Final   Candida tropicalis NOT DETECTED NOT DETECTED Final   Cryptococcus neoformans/gattii NOT DETECTED NOT DETECTED Final    Comment: Performed at [facility], [address] [place], [place] [address]  Blood Culture (routine x 2)     Status: None   Collection Time: 07/29/23  8:31 AM   Specimen: BLOOD RIGHT FOREARM  Result Value Ref Range Status   Specimen Description   Final    BLOOD RIGHT FOREARM Performed at [facility], [address] [place], [place] [address]    Special Requests   Final    BOTTLES DRAWN AEROBIC AND ANAEROBIC Blood Culture adequate volume Performed at [facility], [address] [place], [place] [address]    Culture   Final     NO GROWTH 5 DAYS Performed at [facility], [address] [place], [place] [address]    Report Status 08/03/2023 FINAL  Final  Urine Culture     Status: None   Collection Time: 07/30/23  9:36 AM   Specimen: Urine, Clean Catch  Result Value Ref Range Status   Specimen Description   Final  URINE, CLEAN CATCH Performed at [facility], [address] [place], [place] [address]    Special Requests   Final    NONE Performed at [facility], [address], [place], [place] [address]    Culture   Final    NO GROWTH Performed at [facility], [address], [place], [place] [address]    Report Status 07/31/2023 FINAL  Final  Culture, blood (Routine X 2) w Reflex to ID Panel     Status: None   Collection Time: 07/30/23 11:00 AM   Specimen: BLOOD  Result Value Ref Range Status   Specimen Description   Final    BLOOD LEFT ANTECUBITAL Performed at [facility], [address], [place], [place] [address]    Special Requests   Final    BOTTLES DRAWN AEROBIC AND ANAEROBIC Blood Culture results may not be optimal due to an inadequate volume of blood received in culture bottles Performed at [facility], [address], [place], [place] [address]    Culture   Final    NO GROWTH 5 DAYS Performed at [facility], [address], [place], [place] [address]    Report Status 08/04/2023 FINAL  Final  Culture, blood (Routine X 2) w Reflex to ID Panel     Status: None   Collection Time: 07/30/23 11:01 AM   Specimen: BLOOD  Result Value Ref Range Status   Specimen Description   Final    BLOOD RIGHT ANTECUBITAL Performed at [facility], [address] [place], [place] [address]    Special Requests   Final    BOTTLES DRAWN AEROBIC AND ANAEROBIC Blood Culture results may not be optimal due to an inadequate volume of blood received in culture bottles Performed at [facility], [address], [place], [place] [address]    Culture   Final    NO GROWTH 5 DAYS Performed at [facility], [address], [place], [place] [address]    Report Status 08/04/2023 FINAL  Final  MRSA Next Gen by PCR, Nasal     Status: None   Collection Time: 07/30/23  8:38 PM   Specimen: Nasal Mucosa; Nasal Swab  Result Value Ref Range Status   MRSA by PCR Next Gen NOT DETECTED NOT DETECTED Final    Comment: (NOTE) The GeneXpert MRSA Assay (FDA approved for NASAL specimens only), is one component of a comprehensive MRSA colonization surveillance program. It is not intended to diagnose MRSA infection nor to guide or monitor treatment for MRSA infections. Test performance is not FDA approved in patients less than 15 years old. Performed at [facility], [address] [place], [place] [address]      Studies: No results found.      Brayton Lye, MD  [facility] 08/05/2023  If 7PM-7AM, please contact night-coverage

## 2023-08-05 NOTE — Plan of Care (Signed)

## 2023-08-06 ENCOUNTER — Inpatient Hospital Stay (HOSPITAL_COMMUNITY)

## 2023-08-06 ENCOUNTER — Telehealth (HOSPITAL_BASED_OUTPATIENT_CLINIC_OR_DEPARTMENT_OTHER): Payer: Self-pay | Admitting: *Deleted

## 2023-08-06 DIAGNOSIS — K631 Perforation of intestine (nontraumatic): Secondary | ICD-10-CM | POA: Diagnosis not present

## 2023-08-06 LAB — CBC
HCT: 28.1 % — ABNORMAL LOW (ref 36.0–46.0)
Hemoglobin: 9.4 g/dL — ABNORMAL LOW (ref 12.0–15.0)
MCH: 28.6 pg (ref 26.0–34.0)
MCHC: 33.5 g/dL (ref 30.0–36.0)
MCV: 85.4 fL (ref 80.0–100.0)
Platelets: 511 10*3/uL — ABNORMAL HIGH (ref 150–400)
RBC: 3.29 MIL/uL — ABNORMAL LOW (ref 3.87–5.11)
RDW: 14.2 % (ref 11.5–15.5)
WBC: 13 10*3/uL — ABNORMAL HIGH (ref 4.0–10.5)
nRBC: 0 % (ref 0.0–0.2)

## 2023-08-06 LAB — MAGNESIUM: Magnesium: 1.7 mg/dL (ref 1.7–2.4)

## 2023-08-06 LAB — BASIC METABOLIC PANEL WITH GFR
Anion gap: 15 (ref 5–15)
BUN: 13 mg/dL (ref 8–23)
CO2: 21 mmol/L — ABNORMAL LOW (ref 22–32)
Calcium: 8.1 mg/dL — ABNORMAL LOW (ref 8.9–10.3)
Chloride: 100 mmol/L (ref 98–111)
Creatinine, Ser: 0.54 mg/dL (ref 0.44–1.00)
GFR, Estimated: >60 mL/min (ref >60)
Glucose, Bld: 91 mg/dL (ref 70–99)
Potassium: 4.2 mmol/L (ref 3.5–5.1)
Sodium: 136 mmol/L (ref 135–145)

## 2023-08-06 LAB — URINALYSIS, ROUTINE W REFLEX MICROSCOPIC
Bilirubin Urine: NEGATIVE
Glucose, UA: NEGATIVE mg/dL
Hgb urine dipstick: NEGATIVE
Ketones, ur: 5 mg/dL — AB
Leukocytes,Ua: NEGATIVE
Nitrite: NEGATIVE
Protein, ur: NEGATIVE mg/dL
Specific Gravity, Urine: 1.009 (ref 1.005–1.030)
pH: 6 (ref 5.0–8.0)

## 2023-08-06 LAB — PHOSPHORUS: Phosphorus: 3 mg/dL (ref 2.5–4.6)

## 2023-08-06 LAB — HEPATIC FUNCTION PANEL
ALT: 17 U/L (ref 0–44)
AST: 24 U/L (ref 15–41)
Albumin: 1.6 g/dL — ABNORMAL LOW (ref 3.5–5.0)
Alkaline Phosphatase: 62 U/L (ref 38–126)
Bilirubin, Direct: 0.1 mg/dL (ref 0.0–0.2)
Indirect Bilirubin: 0.4 mg/dL (ref 0.3–0.9)
Total Bilirubin: 0.5 mg/dL (ref 0.0–1.2)
Total Protein: 5.3 g/dL — ABNORMAL LOW (ref 6.5–8.1)

## 2023-08-06 LAB — BRAIN NATRIURETIC PEPTIDE: B Natriuretic Peptide: 313.8 pg/mL — ABNORMAL HIGH (ref 0.0–100.0)

## 2023-08-06 MED ORDER — IOHEXOL 350 MG/ML SOLN
75.0000 mL | Freq: Once | INTRAVENOUS | Status: AC | PRN
  Administered 2023-08-06: 75 mL via INTRAVENOUS

## 2023-08-06 MED ORDER — ALPRAZOLAM 0.5 MG PO TABS
0.5000 mg | ORAL_TABLET | Freq: Every evening | ORAL | Status: DC | PRN
  Administered 2023-08-06: 0.5 mg via ORAL
  Filled 2023-08-06: qty 1

## 2023-08-06 MED ORDER — HYDRALAZINE HCL 20 MG/ML IJ SOLN
10.0000 mg | INTRAMUSCULAR | Status: DC | PRN
  Administered 2023-08-28: 10 mg via INTRAVENOUS
  Filled 2023-08-06: qty 1

## 2023-08-06 MED ORDER — ACYCLOVIR 200 MG PO CAPS
400.0000 mg | ORAL_CAPSULE | Freq: Three times a day (TID) | ORAL | Status: DC
  Administered 2023-08-06 – 2023-08-08 (×7): 400 mg via ORAL
  Filled 2023-08-06 (×9): qty 2

## 2023-08-06 MED ORDER — ACETAMINOPHEN 325 MG PO TABS
650.0000 mg | ORAL_TABLET | Freq: Four times a day (QID) | ORAL | Status: DC
  Administered 2023-08-06 – 2023-08-08 (×7): 650 mg via ORAL
  Filled 2023-08-06 (×6): qty 2

## 2023-08-06 MED ORDER — PIPERACILLIN-TAZOBACTAM 3.375 G IVPB
3.3750 g | Freq: Three times a day (TID) | INTRAVENOUS | Status: AC
  Administered 2023-08-06 – 2023-08-18 (×36): 3.375 g via INTRAVENOUS
  Filled 2023-08-06 (×37): qty 50

## 2023-08-06 MED ORDER — MAGIC MOUTHWASH
5.0000 mL | Freq: Four times a day (QID) | ORAL | Status: DC
  Administered 2023-08-06 – 2023-08-18 (×40): 5 mL via ORAL
  Filled 2023-08-06 (×54): qty 5

## 2023-08-06 MED ORDER — FUROSEMIDE 10 MG/ML IJ SOLN
40.0000 mg | Freq: Once | INTRAMUSCULAR | Status: AC
  Administered 2023-08-06: 40 mg via INTRAVENOUS
  Filled 2023-08-06: qty 4

## 2023-08-06 NOTE — Plan of Care (Signed)
 Pt has rested ;quietly throughout the night but did not get much sleep. Alert and oriented. On room air. SR on the monitor. Up to BR to void. Pt has been up every hour to hour and a half to void. Not going much each time, MD notified. Also c/o heartburn and tums given with relief noted. No other complaints voiced.     Problem: Education: Goal: Knowledge of General Education information will improve Description: Including pain rating scale, medication(s)/side effects and non-pharmacologic comfort measures Outcome: Progressing   Problem: Health Behavior/Discharge Planning: Goal: Ability to manage health-related needs will improve Outcome: Progressing   Problem: Clinical Measurements: Goal: Respiratory complications will improve Outcome: Progressing Goal: Cardiovascular complication will be avoided Outcome: Progressing   Problem: Pain Managment: Goal: General experience of comfort will improve and/or be controlled Outcome: Progressing

## 2023-08-06 NOTE — Progress Notes (Signed)
    Assessment & Plan: POD#7 - s/p ex lap with Hartmann's procedure for stercoral colitis with ischemia and perforation with feculent peritonitis, Dr. Polly 6/22             - full liquid diet - increased abdominal pain overnight - mild increase in WBC - 13K this AM - repeat CT scan ordered by primary service - pending today - BID dressing changes to midline wound - WOC following - continue ostomy education and wound teaching   FEN - full liquid diet VTE - lovenox  ID - [email or web address]  x 5 days post op, completed        [name], MD [facility] practice Office: [phone]        Chief Complaint: Colonic perforation  Subjective: Patient in bed, family at bedside.  Objective: Vital signs in last 24 hours: Temp:  [98 F (36.7 C)-99.5 F (37.5 C)] 99.5 F (37.5 C) (06/29 1235) Pulse Rate:  [86-89] 89 (06/29 1235) Resp:  [18-35] 35 (06/29 1235) BP: (150-157)/(76-84) 152/76 (06/29 1235) SpO2:  [93 %-98 %] 93 % (06/29 1235) Last BM Date : 08/05/23  Intake/Output from previous day: 06/28 0701 - 06/29 0700 In: 240 [P.O.:240] Out: -  Intake/Output this shift: No intake/output data recorded.  Physical Exam: HEENT - sclerae clear, mucous membranes moist Abdomen - mild to moderate distension; small liquid stool in ostomy bag; midline dressing dry and intact  Lab Results:  Recent Labs    08/05/23 0741 08/06/23 0807  WBC 11.8* 13.0*  HGB 9.8* 9.4*  HCT 29.5* 28.1*  PLT 449* 511*   BMET Recent Labs    08/05/23 0741 08/06/23 0807  NA 137 136  K 3.7 4.2  CL 102 100  CO2 25 21*  GLUCOSE 115* 91  BUN 14 13  CREATININE 0.50 0.54  CALCIUM  7.9* 8.1*   PT/INR No results for input(s): LABPROT, INR in the last 72 hours. Comprehensive Metabolic Panel:    Component Value Date/Time   NA 136 08/06/2023 0807   NA 137 08/05/2023 0741   NA 140 11/10/2007 0000   K 4.2 08/06/2023 0807   K 3.7 08/05/2023 0741   CL 100 08/06/2023 0807   CL 102  08/05/2023 0741   CO2 21 (L) 08/06/2023 0807   CO2 25 08/05/2023 0741   BUN 13 08/06/2023 0807   BUN 14 08/05/2023 0741   BUN 10 11/10/2007 0000   CREATININE 0.54 08/06/2023 0807   CREATININE 0.50 08/05/2023 0741   CREATININE 0.74 06/26/2020 1522   GLUCOSE 91 08/06/2023 0807   GLUCOSE 115 (H) 08/05/2023 0741   CALCIUM  8.1 (L) 08/06/2023 0807   CALCIUM  7.9 (L) 08/05/2023 0741   AST 24 08/06/2023 0807   AST 16 07/31/2023 0257   ALT 17 08/06/2023 0807   ALT 10 07/31/2023 0257   ALKPHOS 62 08/06/2023 0807   ALKPHOS 25 (L) 07/31/2023 0257   BILITOT 0.5 08/06/2023 0807   BILITOT 0.7 07/31/2023 0257   PROT 5.3 (L) 08/06/2023 0807   PROT 3.6 (L) 07/31/2023 0257   ALBUMIN  1.6 (L) 08/06/2023 0807   ALBUMIN  1.6 (L) 07/31/2023 0257    Studies/Results: No results found.    [name] 08/06/2023  Patient ID: Angela [name] March, female   DOB: 1951/01/24, 73 y.o.   MRN: [id number]

## 2023-08-06 NOTE — Progress Notes (Signed)
 PROGRESS NOTE  Angela MAXIM FMW:[id number] DOB: 1950-05-09 DOA: 07/30/2023 PCP: Catherine Fuller A, DO   LOS: 7 days   Brief narrative:   Angela [name] is a 73 y.o. female with medical history significant o hypertension, hyperlipidemia, glaucoma and prior tobacco abuse who presents with abdominal pain and bloating. CT abdomen pelvis, which  mild left hydroureteronephrosis and large volume stool burden with stercoral colitis.   Admission was recommended due to concern for sepsis from intra-abdominal infection, but patient declined for which she was prescribed Augmentin  and discharged home.  She presents back with worsening pain, repeat renal CT revealed interval development of intraperitoneal free air and moderate volume free fluid containing layering hyperdensities consistent with bowel perforation, distal left ureteral stone measuring 9 mm with interval decrease left hydroureteronephrosis.  Patient went to surgery for bowel perforation, please see discussion below.    Assessment/Plan: Principal Problem:   Bowel perforation (HCC) Active Problems:   Hydroureteronephrosis   Chronic idiopathic constipation   Metabolic acidosis with increased anion gap and accumulation of organic acids   Hyponatremia   Essential hypertension   Glaucoma   Perforated sigmoid colon (HCC)    Sepsis secondary to bowel perforation  -CT of the abdomen pelvis noting bowel perforation.  Status post exploratory laparotomy with Hartmann procedure for stercoral colitis with ischemia and perforation with feculent peritonitis on 07/30/2023.  - Completed 5 days of IV Zosyn .  Was stopped 6/27, will resume back as she is having worsening leukocytosis. - Management per general surgery, clear liquid diet, advanced to full liquid . - He was encouraged to ambulate, use incentive spirometer. -Wound care to follow for colostomy needs and education. - She is having worsening leukocytosis, so we will resume back on IV Zosyn , and  will obtain CT abdomen pelvis as she is still with significant belching, reflux likely due to significant ileus, continue with IV Protonix for her bad reflux, following CT abdomen pelvis . - Will await further recommendations from GI.  Postoperative ileus -Encouraged to ambulate, minimize narcotics, replete electrolyte for target ofK > 5, Mg > 2, Phos > 3  -Ordered on IV Protonix for significant reflux, this can be related to her ileus.   Hydroureteronephrosis secondary to left ureteral stone   Status post explorative laparotomy.  Renal function and urine output normal.  Denies further pain. - Does appear to be having left kidney stone in distal ureter, but serial imaging including follow-up from for CT abdomen pelvis to CT renal protocol showing improvement, and renal ultrasound obtained 6/24 showing no further hydronephrosis.  - Repeat CT abdomen pelvis today with contrast given worsening leukocytosis and this will assess her hydronephrosis as well.  Odynophagia - Oral exam significant for oral ulcers, will start on antiviral and Magic mouthwash.  Lower extremity edema - this is secondary to volume overload, BNP is elevated, will give 1 dose of IV Lasix and check 2D echo.   Chronic idiopathic constipation Patient is a 18-month history of constipation with hemorrhoids.  CT noted large stool burden on presentation.  Currently status post surgery.  No colostomy output yet.   Metabolic acidosis with elevated anion gap Improved at this time.  Latest bicarbonate of 24.   Hyponatremia Sodium has improved to 134.   Essential hypertension Lisinopril  on hold. Blood pressure started to increase, so we will start on low-dose Coreg especially with heart rate mildly elevated.  Glaucoma Will resume timolol  drops.  DVT prophylaxis: enoxaparin  (LOVENOX ) injection 40 mg Start: 07/31/23 1000 SCDs  Start: 07/30/23 1954 SCDs Start: 07/30/23 1953   Disposition: Uncertain at this time.  Likely need  rehabilitation.  Status is: Inpatient Remains inpatient appropriate because: Pending clinical improvement, status post surgery, n.p.o. status, likely need for rehab medicine    Code Status:     Code Status: Full Code  Family Communication: Spoke with sister-in-law at bedside  Consultants: General Surgery  Procedures: Exploratory laparotomy with Hartmann procedure on 07/30/2023  Anti-infectives:  Zosyn  IV  Anti-infectives (From admission, onward)    Start     Dose/Rate Route Frequency Ordered Stop   08/06/23 1130  acyclovir (ZOVIRAX) 200 MG capsule 400 mg        400 mg Oral 3 times daily 08/06/23 1037 08/13/23 0959   08/06/23 1100  piperacillin -tazobactam (ZOSYN ) IVPB 3.375 g        3.375 g 12.5 mL/hr over 240 Minutes Intravenous Every 8 hours 08/06/23 1051     07/30/23 1800  piperacillin -tazobactam (ZOSYN ) IVPB 3.375 g        3.375 g 12.5 mL/hr over 240 Minutes Intravenous Every 8 hours 07/30/23 1332 08/04/23 2359   07/30/23 1400  piperacillin -tazobactam (ZOSYN ) IVPB 3.375 g  Status:  Discontinued        3.375 g 100 mL/hr over 30 Minutes Intravenous Every 8 hours 07/30/23 1329 07/30/23 1332   07/30/23 1015  piperacillin -tazobactam (ZOSYN ) IVPB 3.375 g        3.375 g 100 mL/hr over 30 Minutes Intravenous  Once 07/30/23 1013 07/30/23 1311       Subjective:  Patient reports pain with swallowing, bad reflux   Objective: Vitals:   08/06/23 1043 08/06/23 1235  BP: (!) 157/84 (!) 152/76  Pulse:  89  Resp: 19 (!) 35  Temp: 99 F (37.2 C) 99.5 F (37.5 C)  SpO2:  93%    Intake/Output Summary (Last 24 hours) at 08/06/2023 1316 Last data filed at 08/06/2023 0600 Gross per 24 hour  Intake 240 ml  Output --  Net 240 ml    Filed Weights   07/30/23 1405 07/30/23 1930 07/31/23 0300  Weight: 57.2 kg 60 kg 61.8 kg   Body mass index is 23.39 kg/m.   Physical Exam:  Awake Alert, Oriented X 3, No new F.N deficits, Normal affect Symmetrical Chest wall movement, Good  air movement bilaterally RRR,No Gallops,Rubs or new Murmurs, No Parasternal Heave +ve B.Sounds, ostomy present liquid brown stool present,, midline surgical wound bandaged  no Cyanosis, Clubbing , +2  edema.   Data Review: I have personally reviewed the following laboratory data and studies,  CBC: Recent Labs  Lab 08/02/23 0412 08/03/23 0603 08/04/23 0731 08/05/23 0741 08/06/23 0807  WBC 14.1* 10.1 9.8 11.8* 13.0*  HGB 10.2* 9.9* 10.1* 9.8* 9.4*  HCT 30.9* 29.5* 30.5* 29.5* 28.1*  MCV 86.3 86.0 86.6 85.5 85.4  PLT 325 340 371 449* 511*   Basic Metabolic Panel: Recent Labs  Lab 08/02/23 0412 08/03/23 0603 08/04/23 0731 08/05/23 0741 08/06/23 0807  NA 134* 134* 134* 137 136  K 3.9 3.5 3.7 3.7 4.2  CL 103 102 104 102 100  CO2 20* 25 21* 25 21*  GLUCOSE 72 97 126* 115* 91  BUN 34* 24* 15 14 13   CREATININE 0.80 0.65 0.50 0.50 0.54  CALCIUM  8.1* 8.0* 7.9* 7.9* 8.1*  MG 1.8  --  1.5*  --  1.7  PHOS 2.7  --  2.6  --  3.0   Liver Function Tests: Recent Labs  Lab 07/31/23 0257 08/06/23 9192  AST 16 24  ALT 10 17  ALKPHOS 25* 62  BILITOT 0.7 0.5  PROT 3.6* 5.3*  ALBUMIN  1.6* 1.6*   No results for input(s): LIPASE, AMYLASE in the last 168 hours.  No results for input(s): AMMONIA in the last 168 hours. Cardiac Enzymes: No results for input(s): CKTOTAL, CKMB, CKMBINDEX, TROPONINI in the last 168 hours. BNP (last 3 results) Recent Labs    08/05/23 0741 08/06/23 1030  BNP 203.7* 313.8*    ProBNP (last 3 results) No results for input(s): PROBNP in the last 8760 hours.  CBG: No results for input(s): GLUCAP in the last 168 hours. Recent Results (from the past 240 hours)  Resp panel by RT-PCR (RSV, Flu A&B, Covid) Anterior Nasal Swab     Status: None   Collection Time: 07/29/23  8:26 AM   Specimen: Anterior Nasal Swab  Result Value Ref Range Status   SARS Coronavirus 2 by RT PCR NEGATIVE NEGATIVE Final    Comment: (NOTE) SARS-CoV-2 target  nucleic acids are NOT DETECTED.  The SARS-CoV-2 RNA is generally detectable in upper respiratory specimens during the acute phase of infection. The lowest concentration of SARS-CoV-2 viral copies this assay can detect is 138 copies/mL. A negative result does not preclude SARS-Cov-2 infection and should not be used as the sole basis for treatment or other patient management decisions. A negative result may occur with  improper specimen collection/handling, submission of specimen other than nasopharyngeal swab, presence of viral mutation(s) within the areas targeted by this assay, and inadequate number of viral copies(<138 copies/mL). A negative result must be combined with clinical observations, patient history, and epidemiological information. The expected result is Negative.  Fact Sheet for Patients:  BloggerCourse.com  Fact Sheet for Healthcare Providers:  SeriousBroker.it  This test is no t yet approved or cleared by the [place]  FDA and  has been authorized for detection and/or diagnosis of SARS-CoV-2 by FDA under an Emergency Use Authorization (EUA). This EUA will remain  in effect (meaning this test can be used) for the duration of the COVID-19 declaration under Section 564(b)(1) of the Act, 21 U.S.C.section 360bbb-3(b)(1), unless the authorization is terminated  or revoked sooner.       Influenza A by PCR NEGATIVE NEGATIVE Final   Influenza B by PCR NEGATIVE NEGATIVE Final    Comment: (NOTE) The Xpert Xpress SARS-CoV-2/FLU/RSV plus assay is intended as an aid in the diagnosis of influenza from Nasopharyngeal swab specimens and should not be used as a sole basis for treatment. Nasal washings and aspirates are unacceptable for Xpert Xpress SARS-CoV-2/FLU/RSV testing.  Fact Sheet for Patients: BloggerCourse.com  Fact Sheet for Healthcare  Providers: SeriousBroker.it  This test is not yet approved or cleared by the [place]  FDA and has been authorized for detection and/or diagnosis of SARS-CoV-2 by FDA under an Emergency Use Authorization (EUA). This EUA will remain in effect (meaning this test can be used) for the duration of the COVID-19 declaration under Section 564(b)(1) of the Act, 21 U.S.C. section 360bbb-3(b)(1), unless the authorization is terminated or revoked.     Resp Syncytial Virus by PCR NEGATIVE NEGATIVE Final    Comment: (NOTE) Fact Sheet for Patients: BloggerCourse.com  Fact Sheet for Healthcare Providers: SeriousBroker.it  This test is not yet approved or cleared by the [place]  FDA and has been authorized for detection and/or diagnosis of SARS-CoV-2 by FDA under an Emergency Use Authorization (EUA). This EUA will remain in effect (meaning this test can be used) for the duration  of the COVID-19 declaration under Section 564(b)(1) of the Act, 21 U.S.C. section 360bbb-3(b)(1), unless the authorization is terminated or revoked.  Performed at [facility], [address], [place], [place] [address]   Blood Culture (routine x 2)     Status: None (Preliminary result)   Collection Time: 07/29/23  8:29 AM   Specimen: BLOOD  Result Value Ref Range Status   Specimen Description   Final    BLOOD RIGHT ANTECUBITAL Performed at [facility], [address] [place], [place] [address]    Special Requests   Final    BOTTLES DRAWN AEROBIC AND ANAEROBIC Blood Culture adequate volume Performed at [facility], [address], [place], [place] [address]    Culture  Setup Time   Final    GRAM POSITIVE COCCI IN CHAINS ANAEROBIC BOTTLE ONLY CRITICAL RESULT CALLED TO, READ BACK BY AND VERIFIED WITH: PHARMD HEATHER WILSON ON 08/01/23 @ 1250 BY DRT    Culture   Final     GRAM POSITIVE COCCI ORGANISM NOT VIABLE Performed at [facility], [address] [place], [place] [address]    Report Status PENDING  Incomplete  Blood Culture ID Panel (Reflexed)     Status: None   Collection Time: 07/29/23  8:29 AM  Result Value Ref Range Status   Enterococcus faecalis NOT DETECTED NOT DETECTED Final   Enterococcus Faecium NOT DETECTED NOT DETECTED Final   Listeria monocytogenes NOT DETECTED NOT DETECTED Final   Staphylococcus species NOT DETECTED NOT DETECTED Final   Staphylococcus aureus (BCID) NOT DETECTED NOT DETECTED Final   Staphylococcus epidermidis NOT DETECTED NOT DETECTED Final   Staphylococcus lugdunensis NOT DETECTED NOT DETECTED Final   Streptococcus species NOT DETECTED NOT DETECTED Final   Streptococcus agalactiae NOT DETECTED NOT DETECTED Final   Streptococcus pneumoniae NOT DETECTED NOT DETECTED Final   Streptococcus pyogenes NOT DETECTED NOT DETECTED Final   A.calcoaceticus-baumannii NOT DETECTED NOT DETECTED Final   Bacteroides fragilis NOT DETECTED NOT DETECTED Final   Enterobacterales NOT DETECTED NOT DETECTED Final   Enterobacter cloacae complex NOT DETECTED NOT DETECTED Final   Escherichia coli NOT DETECTED NOT DETECTED Final   Klebsiella aerogenes NOT DETECTED NOT DETECTED Final   Klebsiella oxytoca NOT DETECTED NOT DETECTED Final   Klebsiella pneumoniae NOT DETECTED NOT DETECTED Final   Proteus species NOT DETECTED NOT DETECTED Final   Salmonella species NOT DETECTED NOT DETECTED Final   Serratia marcescens NOT DETECTED NOT DETECTED Final   Haemophilus influenzae NOT DETECTED NOT DETECTED Final   Neisseria meningitidis NOT DETECTED NOT DETECTED Final   Pseudomonas aeruginosa NOT DETECTED NOT DETECTED Final   Stenotrophomonas maltophilia NOT DETECTED NOT DETECTED Final   Candida albicans NOT DETECTED NOT DETECTED Final   Candida auris NOT DETECTED NOT DETECTED Final   Candida glabrata NOT DETECTED NOT DETECTED Final   Candida  krusei NOT DETECTED NOT DETECTED Final   Candida parapsilosis NOT DETECTED NOT DETECTED Final   Candida tropicalis NOT DETECTED NOT DETECTED Final   Cryptococcus neoformans/gattii NOT DETECTED NOT DETECTED Final    Comment: Performed at [facility], [address], [place], [place] [address]  Blood Culture (routine x 2)     Status: None   Collection Time: 07/29/23  8:31 AM   Specimen: BLOOD RIGHT FOREARM  Result Value Ref Range Status   Specimen Description   Final    BLOOD RIGHT FOREARM Performed at [facility], [address] [place], [place]  [address]    Special Requests   Final    BOTTLES DRAWN AEROBIC AND ANAEROBIC Blood Culture adequate volume Performed at [facility], [address] [place], [place] [address]    Culture   Final    NO GROWTH 5 DAYS Performed at [facility], [address] [place], [place] [address]    Report Status 08/03/2023 FINAL  Final  Urine Culture     Status: None   Collection Time: 07/30/23  9:36 AM   Specimen: Urine, Clean Catch  Result Value Ref Range Status   Specimen Description   Final    URINE, CLEAN CATCH Performed at [facility], [address] [place], [place] [address]    Special Requests   Final    NONE Performed at [facility], [address], [place], [place] [address]    Culture   Final    NO GROWTH Performed at [facility], [address], [place], [place] [address]    Report Status 07/31/2023 FINAL  Final  Culture, blood (Routine X 2) w Reflex to ID Panel     Status: None   Collection Time: 07/30/23 11:00 AM   Specimen: BLOOD  Result Value Ref Range Status   Specimen Description   Final    BLOOD LEFT ANTECUBITAL Performed at [facility], [address], [place], [place] [address]    Special Requests   Final    BOTTLES DRAWN AEROBIC AND ANAEROBIC Blood Culture results may not be optimal due to an inadequate  volume of blood received in culture bottles Performed at [facility], [address] [place], [place] [address]    Culture   Final    NO GROWTH 5 DAYS Performed at [facility], [address], [place], [place] [address]    Report Status 08/04/2023 FINAL  Final  Culture, blood (Routine X 2) w Reflex to ID Panel     Status: None   Collection Time: 07/30/23 11:01 AM   Specimen: BLOOD  Result Value Ref Range Status   Specimen Description   Final    BLOOD RIGHT ANTECUBITAL Performed at [facility], [address] [place], [place] [address]    Special Requests   Final    BOTTLES DRAWN AEROBIC AND ANAEROBIC Blood Culture results may not be optimal due to an inadequate volume of blood received in culture bottles Performed at [facility], [address], [place], [place] [address]    Culture   Final    NO GROWTH 5 DAYS Performed at [facility], [address], [place], [place] [address]    Report Status 08/04/2023 FINAL  Final  MRSA Next Gen by PCR, Nasal     Status: None   Collection Time: 07/30/23  8:38 PM   Specimen: Nasal Mucosa; Nasal Swab  Result Value Ref Range Status   MRSA by PCR Next Gen NOT DETECTED NOT DETECTED Final    Comment: (NOTE) The GeneXpert MRSA Assay (FDA approved for NASAL specimens only), is one component of a comprehensive MRSA colonization surveillance program. It is not intended to diagnose MRSA infection nor to guide or monitor treatment for MRSA infections. Test performance is not FDA approved in patients less than 55 years old. Performed at [facility], [address], [place], [place] [address]      Studies: No results found.      Brayton Lye, MD  [facility] 08/06/2023  If 7PM-7AM, please contact night-coverage

## 2023-08-06 NOTE — Telephone Encounter (Signed)
 Post ED Visit - Positive Culture Follow-up  Culture report reviewed by antimicrobial stewardship pharmacist: [facility] []  Rankin Dee, Pharm.D. []  Venetia Gully, Pharm.D., BCPS AQ-ID []  Garrel Crews, Pharm.D., BCPS []  Almarie Lunger, Pharm.D., BCPS []  [place], [address].D., BCPS, AAHIVP []  Rosaline Bihari, Pharm.D., BCPS, AAHIVP []  Vernell Meier, PharmD, BCPS []  Latanya Hint, PharmD, BCPS []  Donald Medley, PharmD, BCPS []  Rocky Bold, PharmD []  Dorothyann Alert, PharmD, BCPS [x]  Dorn Poot, PharmD  [facility] []  Rosaline Edison, PharmD []  Romona Bliss, PharmD []  Dolphus Roller, PharmD []  Veva Seip, Rph []  Vernell Daunt) Leonce, PharmD []  Eva Allis, PharmD []  Rosaline Millet, PharmD []  Iantha Batch, PharmD []  Arvin Gauss, PharmD []  Wanda Hasting, PharmD []  Ronal Rav, PharmD []  Rocky Slade, PharmD []  Bard Jeans, PharmD   Positive blood culture Pt currently admitted for treatment  Angela [name] 08/06/2023, 12:57 PM

## 2023-08-07 ENCOUNTER — Inpatient Hospital Stay (HOSPITAL_COMMUNITY)

## 2023-08-07 DIAGNOSIS — I10 Essential (primary) hypertension: Secondary | ICD-10-CM | POA: Diagnosis not present

## 2023-08-07 DIAGNOSIS — K631 Perforation of intestine (nontraumatic): Secondary | ICD-10-CM | POA: Diagnosis not present

## 2023-08-07 LAB — PHOSPHORUS: Phosphorus: 3.6 mg/dL (ref 2.5–4.6)

## 2023-08-07 LAB — BASIC METABOLIC PANEL WITH GFR
Anion gap: 13 (ref 5–15)
BUN: 14 mg/dL (ref 8–23)
CO2: 24 mmol/L (ref 22–32)
Calcium: 8 mg/dL — ABNORMAL LOW (ref 8.9–10.3)
Chloride: 99 mmol/L (ref 98–111)
Creatinine, Ser: 0.88 mg/dL (ref 0.44–1.00)
GFR, Estimated: >60 mL/min (ref >60)
Glucose, Bld: 72 mg/dL (ref 70–99)
Potassium: 3.8 mmol/L (ref 3.5–5.1)
Sodium: 136 mmol/L (ref 135–145)

## 2023-08-07 LAB — CULTURE, BLOOD (ROUTINE X 2)
Culture  Setup Time: NO GROWTH
Special Requests: ADEQUATE

## 2023-08-07 LAB — ECHOCARDIOGRAM COMPLETE
Area-P 1/2: 2.63 cm2
Calc EF: 58.3 %
Height: 64 in
S' Lateral: 2.9 cm
Single Plane A2C EF: 61.1 %
Single Plane A4C EF: 56 %
Weight: 2179.91 [oz_av]

## 2023-08-07 LAB — CBC
HCT: 28 % — ABNORMAL LOW (ref 36.0–46.0)
Hemoglobin: 9.2 g/dL — ABNORMAL LOW (ref 12.0–15.0)
MCH: 28.6 pg (ref 26.0–34.0)
MCHC: 32.9 g/dL (ref 30.0–36.0)
MCV: 87 fL (ref 80.0–100.0)
Platelets: 491 10*3/uL — ABNORMAL HIGH (ref 150–400)
RBC: 3.22 MIL/uL — ABNORMAL LOW (ref 3.87–5.11)
RDW: 14.3 % (ref 11.5–15.5)
WBC: 11.9 10*3/uL — ABNORMAL HIGH (ref 4.0–10.5)
nRBC: 0 % (ref 0.0–0.2)

## 2023-08-07 LAB — PROTIME-INR
INR: 1.2 (ref 0.8–1.2)
Prothrombin Time: 15.8 s — ABNORMAL HIGH (ref 11.4–15.2)

## 2023-08-07 LAB — MAGNESIUM: Magnesium: 1.8 mg/dL (ref 1.7–2.4)

## 2023-08-07 MED ORDER — LIDOCAINE HCL 1 % IJ SOLN
10.0000 mL | Freq: Once | INTRAMUSCULAR | Status: AC
  Administered 2023-08-07: 10 mL via INTRADERMAL

## 2023-08-07 MED ORDER — MIDAZOLAM HCL 2 MG/2ML IJ SOLN
INTRAMUSCULAR | Status: AC | PRN
  Administered 2023-08-07 (×4): 1 mg via INTRAVENOUS

## 2023-08-07 MED ORDER — MIDAZOLAM HCL 2 MG/2ML IJ SOLN
INTRAMUSCULAR | Status: AC
  Filled 2023-08-07: qty 2

## 2023-08-07 MED ORDER — DAKINS (1/4 STRENGTH) 0.125 % EX SOLN
Freq: Two times a day (BID) | CUTANEOUS | Status: AC
  Administered 2023-08-08: 1
  Filled 2023-08-07: qty 473

## 2023-08-07 MED ORDER — POLYETHYLENE GLYCOL 3350 17 G PO PACK
17.0000 g | PACK | Freq: Every day | ORAL | Status: AC
  Administered 2023-08-07 – 2023-08-08 (×2): 17 g
  Filled 2023-08-07 (×3): qty 1

## 2023-08-07 MED ORDER — FENTANYL CITRATE (PF) 100 MCG/2ML IJ SOLN
INTRAMUSCULAR | Status: AC
Start: 2023-08-07 — End: 2023-08-07
  Filled 2023-08-07: qty 2

## 2023-08-07 MED ORDER — FENTANYL CITRATE (PF) 100 MCG/2ML IJ SOLN
INTRAMUSCULAR | Status: AC
  Filled 2023-08-07: qty 2

## 2023-08-07 MED ORDER — FENTANYL CITRATE (PF) 100 MCG/2ML IJ SOLN
INTRAMUSCULAR | Status: AC | PRN
  Administered 2023-08-07 (×4): 50 ug via INTRAVENOUS

## 2023-08-07 MED ORDER — LORAZEPAM 2 MG/ML IJ SOLN
0.5000 mg | Freq: Every evening | INTRAMUSCULAR | Status: DC | PRN
  Administered 2023-08-07: 0.5 mg via INTRAVENOUS
  Filled 2023-08-07: qty 1

## 2023-08-07 NOTE — Consult Note (Signed)
 Chief Complaint: Patient was seen in consultation today for intraabdominal fluid collection   Procedure: Percutaneous drain placement  Referring Physician(s): Dr. Brayton Elgergawy  Supervising Physician: Luverne Aran  Patient Status: [facility] - In-pt  History of Present Illness: Angela [name] is a 73 y.o. female with a history of chronic constipation who presented to the [facility] ED on 6/21 with complaints of abdominal pain for the past several weeks. CT revealed left renal stone and evidence of some colitis and WBC 12.6. It was recommended that patient be transferred for admission, but she elected to go home. She returned again on 6/22 with complaints of worsening abdominal pain. Repeat CT revealed pneumoperitoneum with a moderate amount of free fluid. General surgery was consulted and patient underwent ex lap with sigmoidectomy and Hartmann's procedure on 6/22 with Dr. KANDICE Idler. She was started on IV antibiotics, her diet was gradually advanced, and patient initially seemed to be recovering well. Unfortunately, her WBC count started to uptrend a few days ago and patient started to report worsening abdominal pain. CT A/P completed which revealed complex free fluid w/ air in the surgical bed concerning for superimposed infection vs bowel leak. IR consulted for percutaneous drain placement. Case and imaging reviewed and approved by Dr. KANDICE Luverne.   Patient is resting in bed with her family at the bedside. She admits to worsening LLQ abdominal pain and some mild pain around her incision site. Denies any fevers/chills, nausea/vomiting, chest pain, or shortness of breath. NPO since midnight. INR pending. VSS. All questions and concerns answered at the bedside.   Code Status: Full Code  Past Medical History:  Diagnosis Date   Arthritis    Blood in stool    greater than 4 years since last incidence of blood in stool    Colon polyps    Glaucoma    Hyperlipidemia    Hypertension     Inclusion cyst 2021   face   PNA (pneumonia) 2006   Strain of left wrist 04/16/2019    Past Surgical History:  Procedure Laterality Date   ABSCESS DRAINAGE  2021   face- inclusion cyst   BREAST BIOPSY Left 07/19/2018   US  guided breast bx with clip placement- benign   COLON RESECTION SIGMOID  07/30/2023   Procedure: COLECTOMY, SIGMOID, OPEN;  Surgeon: Idler Cordella LABOR, MD;  Location: [facility] OR;  Service: General;;   EYE SURGERY  1956   HEMORRHOID SURGERY  2001   LAPAROTOMY N/A 07/30/2023   Procedure: LAPAROTOMY, EXPLORATORY;  Surgeon: Idler Cordella LABOR, MD;  Location: [facility] OR;  Service: General;  Laterality: N/A;   RADIOACTIVE SEED GUIDED EXCISIONAL BREAST BIOPSY Left 03/15/2019   Procedure: RADIOACTIVE SEED GUIDED EXCISIONAL LEFT  BREAST BIOPSY;  Surgeon: Gladis Cough, MD;  Location: [facility];  Service: General;  Laterality: Left;   TONSILLECTOMY  1963   WISDOM TOOTH EXTRACTION      Allergies: Codeine  Medications: Prior to Admission medications   Medication Sig Start Date End Date Taking? Authorizing Provider  albuterol  (VENTOLIN  HFA) 108 (90 Base) MCG/ACT inhaler Inhale 2 puffs into the lungs every 6 (six) hours as needed for wheezing or shortness of breath. 03/03/22  Yes Kuneff, Renee A, DO  Calcium  Polycarbophil (FIBER-CAPS PO) Take 1 capsule by mouth daily at 12 noon.   Yes [provider]  HYDROcodone -acetaminophen  (NORCO/VICODIN) 5-325 MG tablet Take 1 tablet by mouth every 6 (six) hours as needed for moderate pain (pain score 4-6). 07/29/23  Yes Zackowski, Scott,  MD  hydrocortisone  (ANUSOL -HC) 25 MG suppository Place 1 suppository (25 mg total) rectally 2 (two) times daily. 06/23/23  Yes Kuneff, Renee A, DO  hydrocortisone  cream 1 % Apply 1 Application topically 2 (two) times daily. 06/08/23  Yes Kuneff, Renee A, DO  latanoprost  (XALATAN ) 0.005 % ophthalmic solution Place 1 drop into both eyes at bedtime. 08/11/17  Yes [provider]  LINZESS 72  MCG capsule Take 72 mcg by mouth daily before breakfast. 06/26/23  Yes [provider]  lisinopril  (ZESTRIL ) 10 MG tablet TAKE 1 TABLET BY MOUTH DAILY 05/22/23  Yes Kuneff, Renee A, DO  Multiple Vitamin (MULTIVITAMIN) tablet Take 1 tablet by mouth daily.   Yes [provider]  ondansetron  (ZOFRAN -ODT) 4 MG disintegrating tablet Take 1 tablet (4 mg total) by mouth every 8 (eight) hours as needed for nausea or vomiting. 07/29/23  Yes Zackowski, Scott, MD  Probiotic Product (DAILY PROBIOTIC PO) Take 1 capsule by mouth daily at 12 noon.   Yes [provider]  timolol  (TIMOPTIC ) 0.5 % ophthalmic solution Place 1 drop into both eyes 2 (two) times daily. 11/14/19  Yes [provider]  amoxicillin -clavulanate (AUGMENTIN ) 875-125 MG tablet Take 1 tablet by mouth every 12 (twelve) hours. Patient not taking: Reported on 07/31/2023 07/29/23   Zackowski, Scott, MD  atorvastatin  (LIPITOR) 20 MG tablet Take 1 tablet (20 mg total) by mouth at bedtime. Patient not taking: Reported on 02/22/2023 11/30/21   Catherine Charlies LABOR, DO     Family History  Problem Relation Age of Onset   Heart attack Father    Heart attack Sister    Hyperlipidemia Sister    Hypertension Sister     Social History   Socioeconomic History   Marital status: Married    Spouse name: Not on file   Number of children: Not on file   Years of education: Not on file   Highest education level: Bachelor's degree (e.g., BA, AB, BS)  Occupational History   Not on file  Tobacco Use   Smoking status: Former    Current packs/day: 0.00    Types: Cigars, Cigarettes   Smokeless tobacco: Never   Tobacco comments:    *occasional smoker   Vaping Use   Vaping status: Former  Substance and Sexual Activity   Alcohol use: Yes    Alcohol/week: 4.0 standard drinks of alcohol    Types: 4 Glasses of wine per week    Comment: *rarely    Drug use: No   Sexual activity: Yes    Partners: Male    Comment: Married  Other  Topics Concern   Not on file  Social History Narrative   Married. Retired. 2 children.   Bachelors degree.   Exercises routinely.   Drinks caffeine.   Smoke alarm in the home. Wears her seatbelt. Owns firearms.   Feels safe in her relationships.   Social Drivers of Health   Financial Resource Strain: Patient Declined (04/19/2023)   Overall Financial Resource Strain (CARDIA)    Difficulty of Paying Living Expenses: Patient declined  Food Insecurity: Patient Declined (08/02/2023)   Hunger Vital Sign    Worried About Running Out of Food in the Last Year: Patient declined    Ran Out of Food in the Last Year: Patient declined  Transportation Needs: Patient Declined (08/02/2023)   PRAPARE - Transportation    Lack of Transportation (Medical): Patient declined    Lack of Transportation (Non-Medical): Patient declined  Physical Activity: Sufficiently Active (04/19/2023)  Exercise Vital Sign    Days of Exercise per Week: 5 days    Minutes of Exercise per Session: 30 min  Stress: No Stress Concern Present (04/19/2023)   [employer] of Occupational Health - Occupational Stress Questionnaire    Feeling of Stress : Not at all  Social Connections: Patient Declined (08/02/2023)   Social Connection and Isolation Panel    Frequency of Communication with Friends and Family: Patient declined    Frequency of Social Gatherings with Friends and Family: Patient declined    Attends Religious Services: Patient declined    Database administrator or Organizations: Patient declined    Attends Banker Meetings: Patient declined    Marital Status: Patient declined    Review of Systems  Gastrointestinal:  Positive for abdominal pain (worse in the LLQ).  Denies any N/V, chest pain, shortness of breath, fevers/chills. All other ROS negative.  Vital Signs: BP (!) 159/84   Pulse 89   Temp 98.2 F (36.8 C) (Oral)   Resp 17   Ht 5' 4 (1.626 m)   Wt 136 lb 3.9 oz (61.8 kg)   SpO2 91%    BMI 23.39 kg/m    Physical Exam Vitals reviewed.  Constitutional:      Comments: Appears uncomfortable  HENT:     Head: Normocephalic and atraumatic.     Nose:     Comments: NG tube in place w/ ~ 200cc of green fluid in container    Mouth/Throat:     Mouth: Mucous membranes are moist.     Pharynx: Oropharynx is clear.   Cardiovascular:     Rate and Rhythm: Normal rate and regular rhythm.     Heart sounds: Normal heart sounds.  Pulmonary:     Effort: Pulmonary effort is normal.     Breath sounds: Normal breath sounds.  Abdominal:     Palpations: Abdomen is soft.     Tenderness: There is abdominal tenderness.     Comments: Midline incision w/ clean, dry dressing. Colostomy in place w/ small amount of soft stool. TTP of the right and left lower quadrants   Musculoskeletal:     Cervical back: Normal range of motion.   Skin:    General: Skin is warm and dry.   Neurological:     General: No focal deficit present.     Mental Status: She is alert and oriented to person, place, and time.   Psychiatric:        Mood and Affect: Mood normal.        Behavior: Behavior normal.     Imaging: DG Abd Portable 1V Result Date: 08/06/2023 CLINICAL DATA:  NG tube placement. EXAM: PORTABLE ABDOMEN - 1 VIEW COMPARISON:  CT earlier today FINDINGS: Tip and side port of the enteric tube below the diaphragm in the stomach. Dilated bowel loops in the upper abdomen are partially included in the field of view. IMPRESSION: Tip and side port of the enteric tube below the diaphragm in the stomach. Electronically Signed   By: Andrea Gasman M.D.   On: 08/06/2023 20:14   CT ABDOMEN PELVIS W CONTRAST Addendum Date: 08/06/2023 ADDENDUM REPORT: 08/06/2023 16:20 ADDENDUM: Critical Value/emergent results were called by telephone at the time of interpretation on 08/06/2023 at 4:20 pm to provider DAWOOD [facility] , who verbally acknowledged these results. Electronically Signed   By: [name] Birmingham M.D.   On:  08/06/2023 16:20   Result Date: 08/06/2023 CLINICAL DATA:  History of bowel perforation  postoperatively and hydronephrosis. Worsening leukocytosis. EXAM: CT ABDOMEN AND PELVIS WITH CONTRAST TECHNIQUE: Multidetector CT imaging of the abdomen and pelvis was performed using the standard protocol following bolus administration of intravenous contrast. RADIATION DOSE REDUCTION: This exam was performed according to the departmental dose-optimization program which includes automated exposure control, adjustment of the mA and/or kV according to patient size and/or use of iterative reconstruction technique. CONTRAST:  75mL OMNIPAQUE  IOHEXOL  350 MG/ML SOLN COMPARISON:  07/30/2023. FINDINGS: Lower chest: Multi-vessel coronary artery calcifications are noted. There is a small left pleural effusion. Atelectasis or infiltrate is present at the lung bases bilaterally, greater on the left than on the right. Hepatobiliary: Stable cysts are present in the left lobe of the liver. The gallbladder is without stones. No biliary ductal dilatation is seen. Pancreas: Unremarkable. No pancreatic ductal dilatation or surrounding inflammatory changes. Spleen: Normal in size without focal abnormality. Adrenals/Urinary Tract: The adrenal glands are within normal limits. There is a slightly delayed nephrogram on the left. No renal calculus is seen bilaterally. Scattered hypodensities are noted in the kidneys bilaterally, likely cysts. There is moderate hydronephrosis on the left with a 9 mm calculus in the distal left ureter at the pelvic inlet. No hydronephrosis on the right. The bladder is unremarkable. Stomach/Bowel: The stomach is distended with fluid. Multiple loops of distended small bowel are noted in the abdomen measuring up to 4.3 cm in the mid left abdomen. No definite transition point is seen. There is evidence of partial colectomy with left lower quadrant colostomy. Appendix appears normal. Vascular/Lymphatic: Aortic  atherosclerosis. No enlarged abdominal or pelvic lymph nodes. Reproductive: Uterus and bilateral adnexa are unremarkable. Other: Postsurgical changes are noted in the low anterior abdominal wall. Fat stranding, free air, and free fluid are present in the inferior abdomen and pelvis. There is a complex fluid collection containing air in the pelvis and presacral space. Mild anasarca is noted. Musculoskeletal: Degenerative changes are noted in the thoracolumbar spine. No acute osseous abnormality is seen. IMPRESSION: 1. Status post partial colectomy with left lower quadrant ostomy. Complex free fluid with multiple foci of air, most pronounced in the surgical bed. While findings may be postsurgical, possibility of superimposed infection or bowel leak cannot be excluded. 2. A few mildly distended loops of small bowel in the mid left abdomen with no obvious transition point, possible postoperative ileus versus early or partial small bowel obstruction. 3. Small left pleural effusion with atelectasis or infiltrate at the lung bases. 4. Moderate left hydroureteronephrosis with a 9 mm stone in the distal left ureter at the pelvic inlet, unchanged. 5. Aortic atherosclerosis and coronary artery calcifications. Electronically Signed: By: [name] Birmingham M.D. On: 08/06/2023 16:10   DG Abd Portable 1V Result Date: 08/03/2023 CLINICAL DATA:  Distension EXAM: PORTABLE ABDOMEN - 1 VIEW COMPARISON:  CT 07/30/2023 FINDINGS: Interval left lower quadrant ostomy. Mildly dilated gas-filled bowel in the left lower quadrant measuring up to 3.6 cm. Moderate to large stool burden. IMPRESSION: Interval left lower quadrant ostomy. Mildly dilated gas-filled bowel in the left lower quadrant, possible mild postoperative ileus. Moderate to large stool burden. Electronically Signed   By: Luke Bun M.D.   On: 08/03/2023 14:52   US  RENAL Result Date: 08/01/2023 CLINICAL DATA:  Hydronephrosis EXAM: RENAL / URINARY TRACT ULTRASOUND COMPLETE  COMPARISON:  CT abdomen pelvis 07/30/2023 FINDINGS: Right Kidney: Renal measurements: 11.4 x 4.8 x 4.6 cm = volume: 131 mL. Echogenicity within normal limits. No mass or hydronephrosis visualized. Left Kidney: Renal measurements: 12.2 x  5.0 x 4.7 cm = volume: 152 mL. Echogenicity within normal limits. No mass or hydronephrosis visualized. Bladder: Visualized due to overlying bandages. Other: None. IMPRESSION: No hydronephrosis. Electronically Signed   By: Norman Gatlin M.D.   On: 08/01/2023 22:19   DG Chest Port 1 View Result Date: 07/30/2023 CLINICAL DATA:  Abdominal pain EXAM: PORTABLE CHEST 1 VIEW COMPARISON:  07/29/2023 FINDINGS: Heart size and mediastinal contours are normal. Aortic atherosclerotic calcifications. Low lung volumes. No pleural fluid, interstitial edema or airspace consolidation. The visualized osseous structures appear intact. IMPRESSION: Low lung volumes. No acute findings. Electronically Signed   By: Waddell Calk M.D.   On: 07/30/2023 10:23   CT Renal Stone Study Result Date: 07/30/2023 CLINICAL DATA:  Known left ureteral stone with worsening abdominal pain and hematuria EXAM: CT ABDOMEN AND PELVIS WITHOUT CONTRAST TECHNIQUE: Multidetector CT imaging of the abdomen and pelvis was performed following the standard protocol without IV contrast. RADIATION DOSE REDUCTION: This exam was performed according to the departmental dose-optimization program which includes automated exposure control, adjustment of the mA and/or kV according to patient size and/or use of iterative reconstruction technique. COMPARISON:  CT abdomen and pelvis dated 07/29/2023 FINDINGS: Lower chest: No focal consolidation or pulmonary nodule in the lung bases. No pleural effusion or pneumothorax demonstrated. Partially imaged heart size is normal. Coronary artery calcifications. Hepatobiliary: Unchanged segment 2 cyst. No intra or extrahepatic biliary ductal dilation. Vicariously excreted contrast within the  gallbladder. Pancreas: No focal lesions or main ductal dilation. Spleen: Normal in size without focal abnormality. Adrenals/Urinary Tract: No adrenal nodules. Interval decreased left hydroureteronephrosis. Excreted contrast material within the renal collecting systems. Similar location of distal left ureteral stone measuring 9 mm (2:54). Urinary bladder is decompressed. Stomach/Bowel: Normal appearance of the stomach. Persistent large volume stool within the dilated sigmoid colon. Interval increased dilation of the stool-filled cecum. Appendix is not discretely seen. Vascular/Lymphatic: Aortic atherosclerosis. No enlarged abdominal or pelvic lymph nodes. Reproductive: No adnexal masses. Other: Interval development of intraperitoneal free air. New moderate volume free fluid containing layering hyperdensities. Musculoskeletal: No acute or abnormal lytic or blastic osseous lesions. Multilevel degenerative changes of the partially imaged thoracic and lumbar spine. Small fat-containing paraumbilical hernia. IMPRESSION: 1. Interval development of intraperitoneal free air and moderate volume free fluid containing layering hyperdensities, consistent with bowel perforation. 2. Persistent large volume stool within the dilated sigmoid colon. Interval increased dilation of the stool-filled cecum. 3. Similar location of distal left ureteral stone measuring 9 mm with interval decreased left hydroureteronephrosis. 4.  Aortic Atherosclerosis (ICD10-I70.0). Critical Value/emergent results were called by telephone at the time of interpretation on 07/30/2023 at 10:06 am to provider SCOTT ZACKOWSKI , who verbally acknowledged these results. Electronically Signed   By: Limin  Xu M.D.   On: 07/30/2023 10:12   CT ABDOMEN PELVIS W CONTRAST Result Date: 07/29/2023 CLINICAL DATA:  Six week history of left lower quadrant abdominal pain associated with nausea and chills EXAM: CT ABDOMEN AND PELVIS WITH CONTRAST TECHNIQUE: Multidetector CT  imaging of the abdomen and pelvis was performed using the standard protocol following bolus administration of intravenous contrast. RADIATION DOSE REDUCTION: This exam was performed according to the departmental dose-optimization program which includes automated exposure control, adjustment of the mA and/or kV according to patient size and/or use of iterative reconstruction technique. CONTRAST:  OMNIPAQUE  IOHEXOL  300 MG/ML  SOLN COMPARISON:  CT abdomen and pelvis dated 09/17/2020 FINDINGS: Lower chest: No focal consolidation or pulmonary nodule in the lung bases. No pleural effusion  or pneumothorax demonstrated. Partially imaged heart size is normal. Hepatobiliary: Unchanged 1.7 cm segment 2 hypodensity (2:14), likely cyst. Additional subcentimeter hypodensity (2:17), too small to characterize but also likely cysts. No intra or extrahepatic biliary ductal dilation. Normal gallbladder. Pancreas: No focal lesions or main ductal dilation. Spleen: Normal in size without focal abnormality. Adrenals/Urinary Tract: No adrenal nodules. Mild left hydroureteronephrosis to the level of an obstructing 9 mm mid ureteral stone. Bilateral simple/minimally complicated cysts. No focal bladder wall thickening. Stomach/Bowel: Normal appearance of the stomach. Mural thickening of the sigmoid colon, which contains large volume stool. Moderate volume stool throughout the colon. Appendix is not discretely seen. Vascular/Lymphatic: Aortic atherosclerosis. No enlarged abdominal or pelvic lymph nodes. Reproductive: No adnexal masses. Other: Trace pelvic free fluid.  No free air or fluid collection. Musculoskeletal: No acute or abnormal lytic or blastic osseous lesions. Multilevel degenerative changes of the partially imaged thoracic and lumbar spine. Small fat-containing left inguinal hernia. IMPRESSION: 1. Mild left hydroureteronephrosis to the level of an obstructing 9 mm mid ureteral stone. 2. Mural thickening of the sigmoid colon,  which contains large volume stool, which may be related to stercoral colitis. Consider correlation with colonoscopy once acute symptoms have resolved to exclude underlying mass lesion in this area if one has not been performed recently. 3.  Aortic Atherosclerosis (ICD10-I70.0). Electronically Signed   By: Limin  Xu M.D.   On: 07/29/2023 09:54   DG Chest Port 1 View Result Date: 07/29/2023 CLINICAL DATA:  Lower abdominal pain, nausea, chills EXAM: PORTABLE CHEST 1 VIEW COMPARISON:  None Available. FINDINGS: Normal lung volumes. No focal consolidations. No pleural effusion or pneumothorax. The heart size and mediastinal contours are within normal limits. No acute osseous abnormality. IMPRESSION: No acute disease. Electronically Signed   By: Limin  Xu M.D.   On: 07/29/2023 08:41    Labs:  CBC: Recent Labs    08/04/23 0731 08/05/23 0741 08/06/23 0807 08/07/23 0610  WBC 9.8 11.8* 13.0* 11.9*  HGB 10.1* 9.8* 9.4* 9.2*  HCT 30.5* 29.5* 28.1* 28.0*  PLT 371 449* 511* 491*    COAGS: Recent Labs    07/29/23 0826  INR 1.1    BMP: Recent Labs    08/04/23 0731 08/05/23 0741 08/06/23 0807 08/07/23 0610  NA 134* 137 136 136  K 3.7 3.7 4.2 3.8  CL 104 102 100 99  CO2 21* 25 21* 24  GLUCOSE 126* 115* 91 72  BUN 15 14 13 14   CALCIUM  7.9* 7.9* 8.1* 8.0*  CREATININE 0.50 0.50 0.54 0.88  GFRNONAA >60 >60 >60 >60    LIVER FUNCTION TESTS: Recent Labs    07/29/23 0813 07/30/23 0936 07/31/23 0257 08/06/23 0807  BILITOT 0.5 0.6 0.7 0.5  AST 23 23 16 24   ALT 12 14 10 17   ALKPHOS 105 60 25* 62  PROT 7.0 6.1* 3.6* 5.3*  ALBUMIN  3.5 2.8* 1.6* 1.6*    TUMOR MARKERS: No results for input(s): AFPTM, CEA, CA199, CHROMGRNA in the last 8760 hours.  Assessment and Plan:  Sepsis secondary to bowel perforation: RAJAH LAMBA is a 73 y.o. female with a history of chronic constipation who recently underwent ex-lap with sigmoidectomy and Hartmann's procedure due to bowel perforation  with feculent peritonitis. CT A/P on 6/29 with concern for new complex free fluid collection with air.   -NPO since midnight  -INR pending -WBC 11.9 today; continue IV antibiotics per primary -Plan for drain placement with Dr. KANDICE Moan  Risks and benefits discussed with the  patient including bleeding, infection, damage to adjacent structures, bowel perforation/fistula connection, and sepsis.  All of the patient's questions were answered, patient is agreeable to proceed. Consent signed and in chart.  Thank you for this interesting consult. I greatly enjoyed meeting EVADNE OSE and look forward to participating in their care. A copy of this report was sent to the requesting provider on this date.  Electronically Signed: Glennon CHRISTELLA Bal, PA-C 08/07/2023, 8:19 AM   I spent a total of 40 Minutes  in face to face clinical consultation, greater than 50% of which was counseling/coordinating care for abscess drain placement.

## 2023-08-07 NOTE — Progress Notes (Signed)
 PROGRESS NOTE  Angela [name] FMW:[id number] DOB: 11/12/1950 DOA: 07/30/2023 PCP: Catherine Fuller A, DO   LOS: 8 days   Brief narrative:   Angela [name] is a 73 y.o. female with medical history significant o hypertension, hyperlipidemia, glaucoma and prior tobacco abuse who presents with abdominal pain and bloating. CT abdomen pelvis, which  mild left hydroureteronephrosis and large volume stool burden with stercoral colitis.   Admission was recommended due to concern for sepsis from intra-abdominal infection, but patient declined for which she was prescribed Augmentin  and discharged home.  She presents back with worsening pain, repeat renal CT revealed interval development of intraperitoneal free air and moderate volume free fluid containing layering hyperdensities consistent with bowel perforation, distal left ureteral stone measuring 9 mm with interval decrease left hydroureteronephrosis.  Patient went to surgery for bowel perforation, please see discussion below.    Assessment/Plan: Principal Problem:   Bowel perforation (HCC) Active Problems:   Hydroureteronephrosis   Chronic idiopathic constipation   Metabolic acidosis with increased anion gap and accumulation of organic acids   Hyponatremia   Essential hypertension   Glaucoma   Perforated sigmoid colon (HCC)    Sepsis secondary to bowel perforation  -CT of the abdomen pelvis noting bowel perforation.  Status post exploratory laparotomy with Hartmann procedure for stercoral colitis with ischemia and perforation with feculent peritonitis on 07/30/2023.  - Improving, IV Zosyn  discontinued, her diet has been advanced.   - As of 6/29, worsening leukocytosis, worsening abdominal pain, so repeat imaging has been obtained, significant for pelvic abscess, . - IR consulted, plan for percutaneous drain today . - Back on IV antibiotics .   Postoperative ileus -Encouraged to ambulate, minimize narcotics, replete electrolyte for target ofK >  5, Mg > 2, Phos > 3  -Ordered on IV Protonix for significant reflux, this can be related to her ileus. -Repeat imaging showing significant ileus, will discuss with general surgery if TPN is appropriate   Hydroureteronephrosis secondary to left ureteral stone   -Status post explorative laparotomy.  Renal function and urine output normal.  Denies further pain. - Does appear to be having left kidney stone in distal ureter, initially repeat renal ultrasound showing no further hydronephrosis, but upon repeat CT abdomen pelvis 6/29, left hydroureter nephrosis remains with no significant change, so we will consult urology for further recommendations.   Odynophagia - Oral exam significant for oral ulcers, will start on antiviral and Magic mouthwash.  Lower extremity edema - this is secondary to volume overload, BNP is elevated, will give 1 dose of IV Lasix and check 2D echo.  Chronic idiopathic constipation Patient is a 62-month history of constipation with hemorrhoids.  CT noted large stool burden on presentation.  Currently status post surgery.  No colostomy output yet.   Metabolic acidosis with elevated anion gap Improved at this time.  Latest bicarbonate of 24.   Hyponatremia Resolved   Essential hypertension Lisinopril  on hold. Blood pressure started to increase, so  started on low-dose Coreg especially with heart rate mildly elevated.  Glaucoma Will resume timolol  drops.  DVT prophylaxis: enoxaparin  (LOVENOX ) injection 40 mg Start: 07/31/23 1000 SCDs Start: 07/30/23 1954 SCDs Start: 07/30/23 1953   Disposition: Uncertain at this time.  Likely need rehabilitation.  Status is: Inpatient Remains inpatient appropriate because: Pending clinical improvement, status post surgery, n.p.o. status, likely need for rehab medicine    Code Status:     Code Status: Full Code  Family Communication: Spoke with husband, and daughter at  bedside  Consultants: General  Surgery  Procedures: Exploratory laparotomy with Hartmann procedure on 07/30/2023  Anti-infectives:  Zosyn  IV  Anti-infectives (From admission, onward)    Start     Dose/Rate Route Frequency Ordered Stop   08/06/23 1130  acyclovir (ZOVIRAX) 200 MG capsule 400 mg        400 mg Oral 3 times daily 08/06/23 1037 08/13/23 0959   08/06/23 1100  piperacillin -tazobactam (ZOSYN ) IVPB 3.375 g        3.375 g 12.5 mL/hr over 240 Minutes Intravenous Every 8 hours 08/06/23 1051     07/30/23 1800  piperacillin -tazobactam (ZOSYN ) IVPB 3.375 g        3.375 g 12.5 mL/hr over 240 Minutes Intravenous Every 8 hours 07/30/23 1332 08/04/23 2359   07/30/23 1400  piperacillin -tazobactam (ZOSYN ) IVPB 3.375 g  Status:  Discontinued        3.375 g 100 mL/hr over 30 Minutes Intravenous Every 8 hours 07/30/23 1329 07/30/23 1332   07/30/23 1015  piperacillin -tazobactam (ZOSYN ) IVPB 3.375 g        3.375 g 100 mL/hr over 30 Minutes Intravenous  Once 07/30/23 1013 07/30/23 1311       Subjective:  She reports poor night sleep, reports Xanax did not help much overnight.  Objective: Vitals:   08/07/23 1138 08/07/23 1257  BP: (!) 151/77 (!) 146/70  Pulse: 77 86  Resp: (!) 22 (!) 35  Temp: 97.9 F (36.6 C)   SpO2: 92% 100%    Intake/Output Summary (Last 24 hours) at 08/07/2023 1307 Last data filed at 08/07/2023 0400 Gross per 24 hour  Intake --  Output 1200 ml  Net -1200 ml    Filed Weights   07/30/23 1405 07/30/23 1930 07/31/23 0300  Weight: 57.2 kg 60 kg 61.8 kg   Body mass index is 23.39 kg/m.   Physical Exam:  Awake Alert, Oriented X 3, No new F.N deficits, Normal affect Symmetrical Chest wall movement, Good air movement bilaterally, CTAB RRR,No Gallops,Rubs or new Murmurs, No Parasternal Heave +ve B.Sounds, ostomy present liquid brown stool present,, midline surgical wound bandaged  no Cyanosis, Clubbing , +1 edema.   Data Review: I have personally reviewed the following laboratory  data and studies,  CBC: Recent Labs  Lab 08/03/23 0603 08/04/23 0731 08/05/23 0741 08/06/23 0807 08/07/23 0610  WBC 10.1 9.8 11.8* 13.0* 11.9*  HGB 9.9* 10.1* 9.8* 9.4* 9.2*  HCT 29.5* 30.5* 29.5* 28.1* 28.0*  MCV 86.0 86.6 85.5 85.4 87.0  PLT 340 371 449* 511* 491*   Basic Metabolic Panel: Recent Labs  Lab 08/02/23 0412 08/03/23 0603 08/04/23 0731 08/05/23 0741 08/06/23 0807 08/07/23 0610  NA 134* 134* 134* 137 136 136  K 3.9 3.5 3.7 3.7 4.2 3.8  CL 103 102 104 102 100 99  CO2 20* 25 21* 25 21* 24  GLUCOSE 72 97 126* 115* 91 72  BUN 34* 24* 15 14 13 14   CREATININE 0.80 0.65 0.50 0.50 0.54 0.88  CALCIUM  8.1* 8.0* 7.9* 7.9* 8.1* 8.0*  MG 1.8  --  1.5*  --  1.7 1.8  PHOS 2.7  --  2.6  --  3.0 3.6   Liver Function Tests: Recent Labs  Lab 08/06/23 0807  AST 24  ALT 17  ALKPHOS 62  BILITOT 0.5  PROT 5.3*  ALBUMIN  1.6*   No results for input(s): LIPASE, AMYLASE in the last 168 hours.  No results for input(s): AMMONIA in the last 168 hours. Cardiac Enzymes: No results for  input(s): CKTOTAL, CKMB, CKMBINDEX, TROPONINI in the last 168 hours. BNP (last 3 results) Recent Labs    08/05/23 0741 08/06/23 1030  BNP 203.7* 313.8*    ProBNP (last 3 results) No results for input(s): PROBNP in the last 8760 hours.  CBG: No results for input(s): GLUCAP in the last 168 hours. Recent Results (from the past 240 hours)  Resp panel by RT-PCR (RSV, Flu A&B, Covid) Anterior Nasal Swab     Status: None   Collection Time: 07/29/23  8:26 AM   Specimen: Anterior Nasal Swab  Result Value Ref Range Status   SARS Coronavirus 2 by RT PCR NEGATIVE NEGATIVE Final    Comment: (NOTE) SARS-CoV-2 target nucleic acids are NOT DETECTED.  The SARS-CoV-2 RNA is generally detectable in upper respiratory specimens during the acute phase of infection. The lowest concentration of SARS-CoV-2 viral copies this assay can detect is 138 copies/mL. A negative result does not  preclude SARS-Cov-2 infection and should not be used as the sole basis for treatment or other patient management decisions. A negative result may occur with  improper specimen collection/handling, submission of specimen other than nasopharyngeal swab, presence of viral mutation(s) within the areas targeted by this assay, and inadequate number of viral copies(<138 copies/mL). A negative result must be combined with clinical observations, patient history, and epidemiological information. The expected result is Negative.  Fact Sheet for Patients:  BloggerCourse.com  Fact Sheet for Healthcare Providers:  SeriousBroker.it  This test is no t yet approved or cleared by the [place]  FDA and  has been authorized for detection and/or diagnosis of SARS-CoV-2 by FDA under an Emergency Use Authorization (EUA). This EUA will remain  in effect (meaning this test can be used) for the duration of the COVID-19 declaration under Section 564(b)(1) of the Act, 21 U.S.C.section 360bbb-3(b)(1), unless the authorization is terminated  or revoked sooner.       Influenza A by PCR NEGATIVE NEGATIVE Final   Influenza B by PCR NEGATIVE NEGATIVE Final    Comment: (NOTE) The Xpert Xpress SARS-CoV-2/FLU/RSV plus assay is intended as an aid in the diagnosis of influenza from Nasopharyngeal swab specimens and should not be used as a sole basis for treatment. Nasal washings and aspirates are unacceptable for Xpert Xpress SARS-CoV-2/FLU/RSV testing.  Fact Sheet for Patients: BloggerCourse.com  Fact Sheet for Healthcare Providers: SeriousBroker.it  This test is not yet approved or cleared by the [place]  FDA and has been authorized for detection and/or diagnosis of SARS-CoV-2 by FDA under an Emergency Use Authorization (EUA). This EUA will remain in effect (meaning this test can be used) for the  duration of the COVID-19 declaration under Section 564(b)(1) of the Act, 21 U.S.C. section 360bbb-3(b)(1), unless the authorization is terminated or revoked.     Resp Syncytial Virus by PCR NEGATIVE NEGATIVE Final    Comment: (NOTE) Fact Sheet for Patients: BloggerCourse.com  Fact Sheet for Healthcare Providers: SeriousBroker.it  This test is not yet approved or cleared by the [place]  FDA and has been authorized for detection and/or diagnosis of SARS-CoV-2 by FDA under an Emergency Use Authorization (EUA). This EUA will remain in effect (meaning this test can be used) for the duration of the COVID-19 declaration under Section 564(b)(1) of the Act, 21 U.S.C. section 360bbb-3(b)(1), unless the authorization is terminated or revoked.  Performed at [facility], [address], [place], [place] [address]   Blood Culture (routine x 2)     Status: None (Preliminary result)   Collection  Time: 07/29/23  8:29 AM   Specimen: BLOOD  Result Value Ref Range Status   Specimen Description   Final    BLOOD RIGHT ANTECUBITAL Performed at [facility], [address] [place], [place] [address]    Special Requests   Final    BOTTLES DRAWN AEROBIC AND ANAEROBIC Blood Culture adequate volume Performed at [facility], [address] [place], [place] [address]    Culture  Setup Time   Final    GRAM POSITIVE COCCI IN CHAINS ANAEROBIC BOTTLE ONLY CRITICAL RESULT CALLED TO, READ BACK BY AND VERIFIED WITH: PHARMD HEATHER WILSON ON 08/01/23 @ 1250 BY DRT    Culture   Final    GRAM POSITIVE COCCI ORGANISM NOT VIABLE Performed at [facility], [address], [place], [place] [address]    Report Status PENDING  Incomplete  Blood Culture ID Panel (Reflexed)     Status: None   Collection Time: 07/29/23  8:29 AM  Result Value Ref Range Status   Enterococcus faecalis NOT  DETECTED NOT DETECTED Final   Enterococcus Faecium NOT DETECTED NOT DETECTED Final   Listeria monocytogenes NOT DETECTED NOT DETECTED Final   Staphylococcus species NOT DETECTED NOT DETECTED Final   Staphylococcus aureus (BCID) NOT DETECTED NOT DETECTED Final   Staphylococcus epidermidis NOT DETECTED NOT DETECTED Final   Staphylococcus lugdunensis NOT DETECTED NOT DETECTED Final   Streptococcus species NOT DETECTED NOT DETECTED Final   Streptococcus agalactiae NOT DETECTED NOT DETECTED Final   Streptococcus pneumoniae NOT DETECTED NOT DETECTED Final   Streptococcus pyogenes NOT DETECTED NOT DETECTED Final   A.calcoaceticus-baumannii NOT DETECTED NOT DETECTED Final   Bacteroides fragilis NOT DETECTED NOT DETECTED Final   Enterobacterales NOT DETECTED NOT DETECTED Final   Enterobacter cloacae complex NOT DETECTED NOT DETECTED Final   Escherichia coli NOT DETECTED NOT DETECTED Final   Klebsiella aerogenes NOT DETECTED NOT DETECTED Final   Klebsiella oxytoca NOT DETECTED NOT DETECTED Final   Klebsiella pneumoniae NOT DETECTED NOT DETECTED Final   Proteus species NOT DETECTED NOT DETECTED Final   Salmonella species NOT DETECTED NOT DETECTED Final   Serratia marcescens NOT DETECTED NOT DETECTED Final   Haemophilus influenzae NOT DETECTED NOT DETECTED Final   Neisseria meningitidis NOT DETECTED NOT DETECTED Final   Pseudomonas aeruginosa NOT DETECTED NOT DETECTED Final   Stenotrophomonas maltophilia NOT DETECTED NOT DETECTED Final   Candida albicans NOT DETECTED NOT DETECTED Final   Candida auris NOT DETECTED NOT DETECTED Final   Candida glabrata NOT DETECTED NOT DETECTED Final   Candida krusei NOT DETECTED NOT DETECTED Final   Candida parapsilosis NOT DETECTED NOT DETECTED Final   Candida tropicalis NOT DETECTED NOT DETECTED Final   Cryptococcus neoformans/gattii NOT DETECTED NOT DETECTED Final    Comment: Performed at [facility], [address] [place], [place] [address]  Blood  Culture (routine x 2)     Status: None   Collection Time: 07/29/23  8:31 AM   Specimen: BLOOD RIGHT FOREARM  Result Value Ref Range Status   Specimen Description   Final    BLOOD RIGHT FOREARM Performed at [facility], [address], [place], [place] [address]    Special Requests   Final    BOTTLES DRAWN AEROBIC AND ANAEROBIC Blood Culture adequate volume Performed at [facility], [address] [place], [place] [address]    Culture   Final    NO GROWTH 5 DAYS Performed at [facility], [address] [place], [place] [address]    Report Status 08/03/2023 FINAL  Final  Urine Culture     Status: None   Collection Time: 07/30/23  9:36 AM   Specimen: Urine, Clean Catch  Result Value Ref Range Status   Specimen Description   Final    URINE, CLEAN CATCH Performed at [facility], [address] [place], [place] [address]    Special Requests   Final    NONE Performed at [facility], [address] [place], [place] [address]    Culture   Final    NO GROWTH Performed at [facility], [address], [place], [place] [address]    Report Status 07/31/2023 FINAL  Final  Culture, blood (Routine X 2) w Reflex to ID Panel     Status: None   Collection Time: 07/30/23 11:00 AM   Specimen: BLOOD  Result Value Ref Range Status   Specimen Description   Final    BLOOD LEFT ANTECUBITAL Performed at [facility], [address], [place], [place] [address]    Special Requests   Final    BOTTLES DRAWN AEROBIC AND ANAEROBIC Blood Culture results may not be optimal due to an inadequate volume of blood received in culture bottles Performed at [facility], [address] [place], [place] [address]    Culture   Final    NO GROWTH 5 DAYS Performed at [facility], [address] [place], [place] [address]    Report Status 08/04/2023 FINAL  Final  Culture, blood  (Routine X 2) w Reflex to ID Panel     Status: None   Collection Time: 07/30/23 11:01 AM   Specimen: BLOOD  Result Value Ref Range Status   Specimen Description   Final    BLOOD RIGHT ANTECUBITAL Performed at [facility], [address] [place], [place] [address]    Special Requests   Final    BOTTLES DRAWN AEROBIC AND ANAEROBIC Blood Culture results may not be optimal due to an inadequate volume of blood received in culture bottles Performed at [facility], [address] [place], [place] [address]    Culture   Final    NO GROWTH 5 DAYS Performed at [facility], [address] [place], [place] [address]    Report Status 08/04/2023 FINAL  Final  MRSA Next Gen by PCR, Nasal     Status: None   Collection Time: 07/30/23  8:38 PM   Specimen: Nasal Mucosa; Nasal Swab  Result Value Ref Range Status   MRSA by PCR Next Gen NOT DETECTED NOT DETECTED Final    Comment: (NOTE) The GeneXpert MRSA Assay (FDA approved for NASAL specimens only), is one component of a comprehensive MRSA colonization surveillance program. It is not intended to diagnose MRSA infection nor to guide or monitor treatment for MRSA infections. Test performance is not FDA approved in patients less than 44 years old. Performed at [facility], [address], [place], [place] [address]      Studies: DG Abd Portable 1V Result Date: 08/06/2023 CLINICAL DATA:  NG tube placement. EXAM: PORTABLE ABDOMEN - 1 VIEW COMPARISON:  CT earlier today FINDINGS: Tip and side port of the enteric tube below the diaphragm in the stomach. Dilated bowel loops in the upper abdomen are partially included in the field of view. IMPRESSION: Tip and side port of the enteric tube below the diaphragm in the  stomach. Electronically Signed   By: Andrea Gasman M.D.   On: 08/06/2023 20:14   CT ABDOMEN PELVIS W CONTRAST Addendum Date: 08/06/2023 ADDENDUM REPORT: 08/06/2023 16:20 ADDENDUM:  Critical Value/emergent results were called by telephone at the time of interpretation on 08/06/2023 at 4:20 pm to provider Naiara Lombardozzi [facility] , who verbally acknowledged these results. Electronically Signed   By: [name] Birmingham M.D.   On: 08/06/2023 16:20   Result Date: 08/06/2023 CLINICAL DATA:  History of bowel perforation postoperatively and hydronephrosis. Worsening leukocytosis. EXAM: CT ABDOMEN AND PELVIS WITH CONTRAST TECHNIQUE: Multidetector CT imaging of the abdomen and pelvis was performed using the standard protocol following bolus administration of intravenous contrast. RADIATION DOSE REDUCTION: This exam was performed according to the departmental dose-optimization program which includes automated exposure control, adjustment of the mA and/or kV according to patient size and/or use of iterative reconstruction technique. CONTRAST:  75mL OMNIPAQUE  IOHEXOL  350 MG/ML SOLN COMPARISON:  07/30/2023. FINDINGS: Lower chest: Multi-vessel coronary artery calcifications are noted. There is a small left pleural effusion. Atelectasis or infiltrate is present at the lung bases bilaterally, greater on the left than on the right. Hepatobiliary: Stable cysts are present in the left lobe of the liver. The gallbladder is without stones. No biliary ductal dilatation is seen. Pancreas: Unremarkable. No pancreatic ductal dilatation or surrounding inflammatory changes. Spleen: Normal in size without focal abnormality. Adrenals/Urinary Tract: The adrenal glands are within normal limits. There is a slightly delayed nephrogram on the left. No renal calculus is seen bilaterally. Scattered hypodensities are noted in the kidneys bilaterally, likely cysts. There is moderate hydronephrosis on the left with a 9 mm calculus in the distal left ureter at the pelvic inlet. No hydronephrosis on the right. The bladder is unremarkable. Stomach/Bowel: The stomach is distended with fluid. Multiple loops of distended small bowel are noted in the  abdomen measuring up to 4.3 cm in the mid left abdomen. No definite transition point is seen. There is evidence of partial colectomy with left lower quadrant colostomy. Appendix appears normal. Vascular/Lymphatic: Aortic atherosclerosis. No enlarged abdominal or pelvic lymph nodes. Reproductive: Uterus and bilateral adnexa are unremarkable. Other: Postsurgical changes are noted in the low anterior abdominal wall. Fat stranding, free air, and free fluid are present in the inferior abdomen and pelvis. There is a complex fluid collection containing air in the pelvis and presacral space. Mild anasarca is noted. Musculoskeletal: Degenerative changes are noted in the thoracolumbar spine. No acute osseous abnormality is seen. IMPRESSION: 1. Status post partial colectomy with left lower quadrant ostomy. Complex free fluid with multiple foci of air, most pronounced in the surgical bed. While findings may be postsurgical, possibility of superimposed infection or bowel leak cannot be excluded. 2. A few mildly distended loops of small bowel in the mid left abdomen with no obvious transition point, possible postoperative ileus versus early or partial small bowel obstruction. 3. Small left pleural effusion with atelectasis or infiltrate at the lung bases. 4. Moderate left hydroureteronephrosis with a 9 mm stone in the distal left ureter at the pelvic inlet, unchanged. 5. Aortic atherosclerosis and coronary artery calcifications. Electronically Signed: By: [name] Birmingham M.D. On: 08/06/2023 16:10        Brayton Lye, MD  [facility] 08/07/2023  If 7PM-7AM, please contact night-coverage

## 2023-08-07 NOTE — Procedures (Signed)
 Interventional Radiology Procedure:   Indications: Post operative abscess in pelvis  Procedure: CT guided drain placement  Findings: 12 Fr drain placed via right transgluteal approach. Aspirated 100 ml of purulent fluid.  Complications: None     EBL: Minimal  Plan: Send fluid for culture  [name] R. Philip, MD  Pager: [phone]

## 2023-08-07 NOTE — Progress Notes (Signed)
 8 Days Post-Op   Subjective/Chief Complaint: Complains of abd soreness   Objective: Vital signs in last 24 hours: Temp:  [98 F (36.7 C)-99.5 F (37.5 C)] 98.2 F (36.8 C) (06/30 0343) Pulse Rate:  [88-89] 89 (06/30 0343) Resp:  [17-35] 17 (06/30 0343) BP: (145-159)/(76-85) 159/84 (06/30 0343) SpO2:  [91 %-93 %] 91 % (06/30 0343) Last BM Date : 08/07/23  Intake/Output from previous day: 06/29 0701 - 06/30 0700 In: -  Out: 1300 [Emesis/NG output:1200; Stool:100] Intake/Output this shift: No intake/output data recorded.  General appearance: alert and cooperative Resp: clear to auscultation bilaterally Cardio: regular rate and rhythm GI: soft, moderate tenderness. distended  Lab Results:  Recent Labs    08/06/23 0807 08/07/23 0610  WBC 13.0* 11.9*  HGB 9.4* 9.2*  HCT 28.1* 28.0*  PLT 511* 491*   BMET Recent Labs    08/06/23 0807 08/07/23 0610  NA 136 136  K 4.2 3.8  CL 100 99  CO2 21* 24  GLUCOSE 91 72  BUN 13 14  CREATININE 0.54 0.88  CALCIUM  8.1* 8.0*   PT/INR Recent Labs    08/07/23 0909  LABPROT 15.8*  INR 1.2   ABG No results for input(s): PHART, HCO3 in the last 72 hours.  Invalid input(s): PCO2, PO2  Studies/Results: DG Abd Portable 1V Result Date: 08/06/2023 CLINICAL DATA:  NG tube placement. EXAM: PORTABLE ABDOMEN - 1 VIEW COMPARISON:  CT earlier today FINDINGS: Tip and side port of the enteric tube below the diaphragm in the stomach. Dilated bowel loops in the upper abdomen are partially included in the field of view. IMPRESSION: Tip and side port of the enteric tube below the diaphragm in the stomach. Electronically Signed   By: Andrea Gasman M.D.   On: 08/06/2023 20:14   CT ABDOMEN PELVIS W CONTRAST Addendum Date: 08/06/2023 ADDENDUM REPORT: 08/06/2023 16:20 ADDENDUM: Critical Value/emergent results were called by telephone at the time of interpretation on 08/06/2023 at 4:20 pm to provider DAWOOD [facility] , who verbally  acknowledged these results. Electronically Signed   By: [name] Birmingham M.D.   On: 08/06/2023 16:20   Result Date: 08/06/2023 CLINICAL DATA:  History of bowel perforation postoperatively and hydronephrosis. Worsening leukocytosis. EXAM: CT ABDOMEN AND PELVIS WITH CONTRAST TECHNIQUE: Multidetector CT imaging of the abdomen and pelvis was performed using the standard protocol following bolus administration of intravenous contrast. RADIATION DOSE REDUCTION: This exam was performed according to the departmental dose-optimization program which includes automated exposure control, adjustment of the mA and/or kV according to patient size and/or use of iterative reconstruction technique. CONTRAST:  75mL OMNIPAQUE  IOHEXOL  350 MG/ML SOLN COMPARISON:  07/30/2023. FINDINGS: Lower chest: Multi-vessel coronary artery calcifications are noted. There is a small left pleural effusion. Atelectasis or infiltrate is present at the lung bases bilaterally, greater on the left than on the right. Hepatobiliary: Stable cysts are present in the left lobe of the liver. The gallbladder is without stones. No biliary ductal dilatation is seen. Pancreas: Unremarkable. No pancreatic ductal dilatation or surrounding inflammatory changes. Spleen: Normal in size without focal abnormality. Adrenals/Urinary Tract: The adrenal glands are within normal limits. There is a slightly delayed nephrogram on the left. No renal calculus is seen bilaterally. Scattered hypodensities are noted in the kidneys bilaterally, likely cysts. There is moderate hydronephrosis on the left with a 9 mm calculus in the distal left ureter at the pelvic inlet. No hydronephrosis on the right. The bladder is unremarkable. Stomach/Bowel: The stomach is distended with fluid. Multiple loops  of distended small bowel are noted in the abdomen measuring up to 4.3 cm in the mid left abdomen. No definite transition point is seen. There is evidence of partial colectomy with left lower  quadrant colostomy. Appendix appears normal. Vascular/Lymphatic: Aortic atherosclerosis. No enlarged abdominal or pelvic lymph nodes. Reproductive: Uterus and bilateral adnexa are unremarkable. Other: Postsurgical changes are noted in the low anterior abdominal wall. Fat stranding, free air, and free fluid are present in the inferior abdomen and pelvis. There is a complex fluid collection containing air in the pelvis and presacral space. Mild anasarca is noted. Musculoskeletal: Degenerative changes are noted in the thoracolumbar spine. No acute osseous abnormality is seen. IMPRESSION: 1. Status post partial colectomy with left lower quadrant ostomy. Complex free fluid with multiple foci of air, most pronounced in the surgical bed. While findings may be postsurgical, possibility of superimposed infection or bowel leak cannot be excluded. 2. A few mildly distended loops of small bowel in the mid left abdomen with no obvious transition point, possible postoperative ileus versus early or partial small bowel obstruction. 3. Small left pleural effusion with atelectasis or infiltrate at the lung bases. 4. Moderate left hydroureteronephrosis with a 9 mm stone in the distal left ureter at the pelvic inlet, unchanged. 5. Aortic atherosclerosis and coronary artery calcifications. Electronically Signed: By: [name] Birmingham M.D. On: 08/06/2023 16:10    Anti-infectives: Anti-infectives (From admission, onward)    Start     Dose/Rate Route Frequency Ordered Stop   08/06/23 1130  acyclovir (ZOVIRAX) 200 MG capsule 400 mg        400 mg Oral 3 times daily 08/06/23 1037 08/13/23 0959   08/06/23 1100  piperacillin -tazobactam (ZOSYN ) IVPB 3.375 g        3.375 g 12.5 mL/hr over 240 Minutes Intravenous Every 8 hours 08/06/23 1051     07/30/23 1800  piperacillin -tazobactam (ZOSYN ) IVPB 3.375 g        3.375 g 12.5 mL/hr over 240 Minutes Intravenous Every 8 hours 07/30/23 1332 08/04/23 2359   07/30/23 1400   piperacillin -tazobactam (ZOSYN ) IVPB 3.375 g  Status:  Discontinued        3.375 g 100 mL/hr over 30 Minutes Intravenous Every 8 hours 07/30/23 1329 07/30/23 1332   07/30/23 1015  piperacillin -tazobactam (ZOSYN ) IVPB 3.375 g        3.375 g 100 mL/hr over 30 Minutes Intravenous  Once 07/30/23 1013 07/30/23 1311       Assessment/Plan: s/p Procedure(s): LAPAROTOMY, EXPLORATORY (N/A) COLECTOMY, SIGMOID, OPEN Continue ng and bowel rest POD#8 - s/p ex lap with Hartmann's procedure for stercoral colitis with ischemia and perforation with feculent peritonitis, Dr. Polly 6/22             - ng and bowel rest - increased abdominal pain overnight - mild increase in WBC - 13K this AM - repeat CT scan ordered by primary service - IR planning for perc drain - BID dressing changes to midline wound - WOC following - continue ostomy education and wound teaching   FEN - npo VTE - lovenox  ID - zosyn  x 5 days post op, completed  LOS: 8 days    Deward Null III 08/07/2023

## 2023-08-07 NOTE — Progress Notes (Signed)
 PT Cancellation Note  Patient Details Name: Angela [name] MRN: [id number] DOB: 12/31/1950   Cancelled Treatment:    Reason Eval/Treat Not Completed: Patient at procedure or test/unavailable (Pt off the floor at procedure. Will follow up if time allows.)   Mcgregor Tinnon 08/07/2023, 1:46 PM

## 2023-08-07 NOTE — Plan of Care (Signed)

## 2023-08-07 NOTE — Plan of Care (Signed)
 Urology consulted to address incidental finding of 9 mm left distal ureteral stone.  Patient will be unavailable and interventional radiology for some time.  Chart reviewed and based on preserve renal function, mild leukocytosis, and no indication of systemic infectious process, will see her formally in the morning to discuss placing of ureteral stent.

## 2023-08-07 NOTE — Consult Note (Signed)
 WOC Nurse ostomy follow up Stoma type/location: LLQ colostomy Stomal assessment/size:  1 3/4 in stoma, round, red, moist, os at center Peristomal assessment: intact Treatment options for stomal/peristomal skin: 2 in barrier ring Output brown stool Ostomy pouching: 2pc. Flat with barrier ring Education provided:  Spouse, daughter, and family member present at bedside for teaching.  Discussed frequency of pouch change and emptying.  Demonstrated with a return demonstration process to open and close pouch for emptying.  Daughter was able to cut skin barrier, demonstrated application of barrier ring, skin barrier, and attachement of pouch.  Discussed need to order supplies post discharge.  Plan to continue teaching on Thursday at 10 am with family present. Enrolled patient in [place] Secure Start Discharge program: Yes  WOC team will continue to follow for ostomy teaching.  Doyal Polite, RN, MSN, [facility] WOC Team

## 2023-08-08 ENCOUNTER — Telehealth (HOSPITAL_BASED_OUTPATIENT_CLINIC_OR_DEPARTMENT_OTHER): Payer: Self-pay | Admitting: *Deleted

## 2023-08-08 DIAGNOSIS — K631 Perforation of intestine (nontraumatic): Secondary | ICD-10-CM | POA: Diagnosis not present

## 2023-08-08 LAB — MAGNESIUM: Magnesium: 1.9 mg/dL (ref 1.7–2.4)

## 2023-08-08 LAB — BASIC METABOLIC PANEL WITH GFR
Anion gap: 16 — ABNORMAL HIGH (ref 5–15)
BUN: 15 mg/dL (ref 8–23)
CO2: 24 mmol/L (ref 22–32)
Calcium: 7.6 mg/dL — ABNORMAL LOW (ref 8.9–10.3)
Chloride: 98 mmol/L (ref 98–111)
Creatinine, Ser: 0.7 mg/dL (ref 0.44–1.00)
GFR, Estimated: >60 mL/min (ref >60)
Glucose, Bld: 72 mg/dL (ref 70–99)
Potassium: 3.2 mmol/L — ABNORMAL LOW (ref 3.5–5.1)
Sodium: 138 mmol/L (ref 135–145)

## 2023-08-08 LAB — CBC
HCT: 27.5 % — ABNORMAL LOW (ref 36.0–46.0)
Hemoglobin: 9.1 g/dL — ABNORMAL LOW (ref 12.0–15.0)
MCH: 28.9 pg (ref 26.0–34.0)
MCHC: 33.1 g/dL (ref 30.0–36.0)
MCV: 87.3 fL (ref 80.0–100.0)
Platelets: 597 10*3/uL — ABNORMAL HIGH (ref 150–400)
RBC: 3.15 MIL/uL — ABNORMAL LOW (ref 3.87–5.11)
RDW: 14.6 % (ref 11.5–15.5)
WBC: 11.1 10*3/uL — ABNORMAL HIGH (ref 4.0–10.5)
nRBC: 0 % (ref 0.0–0.2)

## 2023-08-08 LAB — PHOSPHORUS: Phosphorus: 3 mg/dL (ref 2.5–4.6)

## 2023-08-08 MED ORDER — LORAZEPAM 2 MG/ML IJ SOLN
0.5000 mg | INTRAMUSCULAR | Status: AC
  Administered 2023-08-08: 0.5 mg via INTRAVENOUS
  Filled 2023-08-08: qty 1

## 2023-08-08 MED ORDER — POTASSIUM CHLORIDE CRYS ER 20 MEQ PO TBCR
40.0000 meq | EXTENDED_RELEASE_TABLET | Freq: Four times a day (QID) | ORAL | Status: AC
  Administered 2023-08-08 (×2): 40 meq via ORAL
  Filled 2023-08-08 (×2): qty 2

## 2023-08-08 MED ORDER — MAGNESIUM SULFATE IN D5W 1-5 GM/100ML-% IV SOLN
1.0000 g | Freq: Once | INTRAVENOUS | Status: AC
  Administered 2023-08-08: 1 g via INTRAVENOUS
  Filled 2023-08-08: qty 100

## 2023-08-08 MED ORDER — ACETAMINOPHEN 500 MG PO TABS
1000.0000 mg | ORAL_TABLET | Freq: Four times a day (QID) | ORAL | Status: DC
  Administered 2023-08-08 – 2023-08-14 (×13): 1000 mg via ORAL
  Filled 2023-08-08 (×14): qty 2

## 2023-08-08 NOTE — Progress Notes (Signed)
 Physical Therapy Treatment Patient Details Name: Angela [name] MRN: [id number] DOB: December 25, 1950 Today's Date: 08/08/2023   History of Present Illness Angela [name] is a 73 y.o. female who presents with abdominal pain and bloating  Renal CT revealed interval development of intraperitoneal free air and moderate volume free fluid containing layering hyperdensities consistent with bowel perforation, distal left ureteral stone measuring 9 mm with interval decrease left hydroureteronephrosis. . Past medical history significant of hypertension, hyperlipidemia, glaucoma and prior tobacco abuse    PT Comments  Pt with fair tolerance to treatment today. Pt able to ambulate short distance in hallway with RW CGA/Min A. 1 LOB noted requiring Min A to correct. Pt now with new drain placement. No change in DC/DME recs at this time. PT will continue to follow.     If plan is discharge home, recommend the following: A little help with walking and/or transfers;A little help with bathing/dressing/bathroom;Assistance with cooking/housework;Direct supervision/assist for medications management;Assist for transportation;Help with stairs or ramp for entrance   Can travel by private vehicle        Equipment Recommendations  Rolling walker (2 wheels)    Recommendations for Other Services       Precautions / Restrictions Precautions Precautions: Fall;Other (comment) Recall of Precautions/Restrictions: Intact Precaution/Restrictions Comments: New ostomy bag and JP drain Restrictions Weight Bearing Restrictions Per Provider Order: No     Mobility  Bed Mobility               General bed mobility comments: In recliner    Transfers Overall transfer level: Needs assistance Equipment used: Rolling walker (2 wheels) Transfers: Sit to/from Stand Sit to Stand: Contact guard assist           General transfer comment: Cues for hand placement.    Ambulation/Gait Ambulation/Gait assistance: Contact  guard assist, Min assist Gait Distance (Feet): 75 Feet Assistive device: Rolling walker (2 wheels) Gait Pattern/deviations: Decreased stride length, Step-through pattern Gait velocity: decreased     General Gait Details: Slowed step through pattern. 1 LOB noted when ambulating in room requiring Min A to correct.   Stairs             Wheelchair Mobility     Tilt Bed    Modified Rankin (Stroke Patients Only)       Balance Overall balance assessment: Needs assistance Sitting-balance support: Bilateral upper extremity supported, Feet supported Sitting balance-Leahy Scale: Good     Standing balance support: Bilateral upper extremity supported, During functional activity Standing balance-Leahy Scale: Fair                              Hotel manager: No apparent difficulties  Cognition Arousal: Alert Behavior During Therapy: Flat affect                           PT - Cognition Comments: Very flat affect. Following commands: Intact      Cueing Cueing Techniques: Verbal cues, Tactile cues  Exercises      General Comments General comments (skin integrity, edema, etc.): VSS      Pertinent Vitals/Pain Pain Assessment Pain Assessment: 0-10 Pain Score: 8  Pain Location: Abdomen, New JP drain site Pain Descriptors / Indicators: Sore Pain Intervention(s): Monitored during session, Limited activity within patient's tolerance    Home Living  Prior Function            PT Goals (current goals can now be found in the care plan section) Progress towards PT goals: Progressing toward goals    Frequency    Min 3X/week      PT Plan      Co-evaluation              AM-PAC PT 6 Clicks Mobility   Outcome Measure  Help needed turning from your back to your side while in a flat bed without using bedrails?: A Little Help needed moving from lying on your back to sitting  on the side of a flat bed without using bedrails?: A Little Help needed moving to and from a bed to a chair (including a wheelchair)?: A Little Help needed standing up from a chair using your arms (e.g., wheelchair or bedside chair)?: A Little Help needed to walk in hospital room?: A Little Help needed climbing 3-5 steps with a railing? : A Little 6 Click Score: 18    End of Session Equipment Utilized During Treatment:  (Deferred gait belt due to ostomy bag and new JP drain) Activity Tolerance: Patient tolerated treatment well Patient left: in chair;with call bell/phone within reach;with family/visitor present Nurse Communication: Mobility status PT Visit Diagnosis: Other abnormalities of gait and mobility (R26.89)     Time: 8570-8553 PT Time Calculation (min) (ACUTE ONLY): 17 min  Charges:    $Gait Training: 8-22 mins PT General Charges $$ ACUTE PT VISIT: 1 Visit                     Tayjah Lobdell B, PT, DPT Acute Rehab Services [phone]    Penina Reisner 08/08/2023, 3:54 PM

## 2023-08-08 NOTE — Progress Notes (Signed)
 9 Days Post-Op Subjective: Patient was quite sedated and resting in bed.  She was eventually awoken and seemed coherent while awake, but nodded off frequently.  Her sister was present at bedside again and stated that she had not woken up since being administered sedating medications last night.  Objective: Vital signs in last 24 hours: Temp:  [97.9 F (36.6 C)-98.7 F (37.1 C)] 97.9 F (36.6 C) (07/01 0930) Pulse Rate:  [78-95] 82 (07/01 0930) Resp:  [15-99] 20 (07/01 0930) BP: (114-156)/(66-132) 149/74 (07/01 0930) SpO2:  [91 %-100 %] 95 % (07/01 0930)  Assessment/Plan: # 9 mm left distal ureteral stone Patient had expressed some discomfort previously but was unable to clearly differentiate between this and her other diffuse abdominal pain.  Stone has been in place for some time and is less likely to be an acute change. Renal function is preserved. Will continue to monitor and consider lithotripsy over the coming days once she has had an opportunity to recover slightly. Urology will follow peripherally.  Case and plan discussed with Dr. Lovie  Intake/Output from previous day: 06/30 0701 - 07/01 0700 In: -  Out: 2120 [Urine:500; Emesis/NG output:1250; Drains:370]  Intake/Output this shift: Total I/O In: -  Out: 50 [Drains:50]  Physical Exam:  General: Sedated CV: No cyanosis Lungs: equal chest rise GU: Purewick in place draining clear yellow urine.  Lab Results: Recent Labs    08/06/23 0807 08/07/23 0610 08/08/23 0831  HGB 9.4* 9.2* 9.1*  HCT 28.1* 28.0* 27.5*   BMET Recent Labs    08/07/23 0610 08/08/23 0831  NA 136 138  K 3.8 3.2*  CL 99 98  CO2 24 24  GLUCOSE 72 72  BUN 14 15  CREATININE 0.88 0.70  CALCIUM  8.0* 7.6*  HGB 9.2* 9.1*  WBC 11.9* 11.1*     Studies/Results: ECHOCARDIOGRAM COMPLETE Result Date: 08/07/2023    ECHOCARDIOGRAM REPORT   Patient Name:   Angela [name] Date of Exam: 08/07/2023 Medical Rec #:  [id number]      Height:        64.0 in Accession #:    [id number]     Weight:       136.2 lb Date of Birth:  27-Jan-1951     BSA:          1.662 m Patient Age:    72 years       BP:           137/59 mmHg Patient Gender: F              HR:           79 bpm. Exam Location:  Inpatient Procedure: 2D Echo, Cardiac Doppler and Color Doppler (Both Spectral and Color            Flow Doppler were utilized during procedure). Indications:    CHF  History:        Patient has no prior history of Echocardiogram examinations.                 CHF; Risk Factors:Hypertension.  Sonographer:    Vella Key Referring Phys: BRAYTON GORMAN LYE IMPRESSIONS  1. Left ventricular ejection fraction, by estimation, is 60 to 65%. The left ventricle has normal function. The left ventricle has no regional wall motion abnormalities. There is mild concentric left ventricular hypertrophy. Left ventricular diastolic parameters were normal.  2. Right ventricular systolic function is normal. The right ventricular size is normal.  3. The mitral valve  is normal in structure. Trivial mitral valve regurgitation. No evidence of mitral stenosis.  4. The aortic valve is normal in structure. Aortic valve regurgitation is not visualized. No aortic stenosis is present.  5. The inferior vena cava is normal in size with greater than 50% respiratory variability, suggesting right atrial pressure of 3 mmHg. FINDINGS  Left Ventricle: Left ventricular ejection fraction, by estimation, is 60 to 65%. The left ventricle has normal function. The left ventricle has no regional wall motion abnormalities. The left ventricular internal cavity size was normal in size. There is  mild concentric left ventricular hypertrophy. Left ventricular diastolic parameters were normal. Right Ventricle: The right ventricular size is normal. No increase in right ventricular wall thickness. Right ventricular systolic function is normal. Left Atrium: Left atrial size was normal in size. Right Atrium: Right atrial size was  normal in size. Pericardium: There is no evidence of pericardial effusion. Mitral Valve: The mitral valve is normal in structure. Trivial mitral valve regurgitation. No evidence of mitral valve stenosis. Tricuspid Valve: The tricuspid valve is normal in structure. Tricuspid valve regurgitation is mild . No evidence of tricuspid stenosis. Aortic Valve: The aortic valve is normal in structure. Aortic valve regurgitation is not visualized. No aortic stenosis is present. Pulmonic Valve: The pulmonic valve was normal in structure. Pulmonic valve regurgitation is trivial. No evidence of pulmonic stenosis. Aorta: The aortic root is normal in size and structure. Venous: The inferior vena cava is normal in size with greater than 50% respiratory variability, suggesting right atrial pressure of 3 mmHg. IAS/Shunts: No atrial level shunt detected by color flow Doppler.  LEFT VENTRICLE PLAX 2D LVIDd:         3.20 cm     Diastology LVIDs:         2.90 cm     LV e' medial:    8.70 cm/s LV PW:         1.00 cm     LV E/e' medial:  9.2 LV IVS:        0.90 cm     LV e' lateral:   9.46 cm/s LVOT diam:     1.80 cm     LV E/e' lateral: 8.5 LV SV:         78 LV SV Index:   47 LVOT Area:     2.54 cm  LV Volumes (MOD) LV vol d, MOD A2C: 65.8 ml LV vol d, MOD A4C: 69.6 ml LV vol s, MOD A2C: 25.6 ml LV vol s, MOD A4C: 30.6 ml LV SV MOD A2C:     40.2 ml LV SV MOD A4C:     69.6 ml LV SV MOD BP:      39.5 ml RIGHT VENTRICLE RV Basal diam:  2.90 cm RV S prime:     17.70 cm/s TAPSE (M-mode): 2.5 cm LEFT ATRIUM             Index        RIGHT ATRIUM           Index LA diam:        2.70 cm 1.62 cm/m   RA Area:     12.90 cm LA Vol (A2C):   41.9 ml 25.21 ml/m  RA Volume:   26.20 ml  15.77 ml/m LA Vol (A4C):   37.9 ml 22.81 ml/m LA Biplane Vol: 40.5 ml 24.37 ml/m  AORTIC VALVE LVOT Vmax:   152.00 cm/s LVOT Vmean:  109.000 cm/s LVOT VTI:  0.305 m  AORTA Ao Root diam: 3.00 cm Ao Asc diam:  3.20 cm MITRAL VALVE               TRICUSPID VALVE MV Area  (PHT): 2.63 cm    TV Peak grad:   30.2 mmHg MV Decel Time: 288 msec    TV Vmax:        2.75 m/s MV E velocity: 80.20 cm/s MV A velocity: 99.60 cm/s  SHUNTS MV E/A ratio:  0.81        Systemic VTI:  0.30 m                            Systemic Diam: 1.80 cm Aditya Sabharwal Electronically signed by Ria Commander Signature Date/Time: 08/07/2023/3:37:59 PM    Final    CT GUIDED PERITONEAL/RETROPERITONEAL FLUID DRAIN BY PERC CATH Result Date: 08/07/2023 INDICATION: 73 year old with history of 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 Patient has postoperative fluid collections that are concerning for abscess. EXAM: CT-GUIDED PLACEMENT OF PELVIC ABSCESS DRAIN TECHNIQUE: Multidetector CT imaging of the pelvis was performed following the standard protocol without IV contrast. RADIATION DOSE REDUCTION: This exam was performed according to the departmental dose-optimization program which includes automated exposure control, adjustment of the mA and/or kV according to patient size and/or use of iterative reconstruction technique. MEDICATIONS: Moderate sedation ANESTHESIA/SEDATION: Moderate (conscious) sedation was employed during this procedure. A total of Versed  4 mg and Fentanyl  200 mcg was administered intravenously by the radiology nurse. Total intra-service moderate Sedation Time: 31 minutes. The patient's level of consciousness and vital signs were monitored continuously by radiology nursing throughout the procedure under my direct supervision. COMPLICATIONS: None immediate. PROCEDURE: Informed written consent was obtained from the patient after a thorough discussion of the procedural risks, benefits and alternatives. All questions were addressed. Maximal Sterile Barrier Technique was utilized including caps, mask, sterile gowns, sterile gloves, sterile drape, hand hygiene and skin antiseptic. A timeout was performed prior to the initiation of the  procedure. Patient was placed prone on the CT scanner. Images through the pelvis were obtained. Right buttock region was prepped with chlorhexidine  and sterile field was created. Skin and soft tissues were anesthetized with 1% lidocaine . Small incision was made. Using CT guidance, an 18 gauge trocar needle was directed into the pelvic fluid collection using a right transgluteal approach. Gray colored purulent fluid was aspirated. Superstiff Amplatz wire was advanced into the collection. Follow up CT images were obtained. The tract was dilated to accommodate a 12 [place] multipurpose drain. 100 mL of purulent fluid was aspirated. Follow up CT images were obtained. Drain was sutured to skin and attached to a suction bulb. Fluid was sent for culture. FINDINGS: 19 French drain placed from a right transgluteal approach. 100 mL of purulent fluid was removed. IMPRESSION: CT-guided placement of a drain in the pelvic abscess. Electronically Signed   By: Juliene Balder M.D.   On: 08/07/2023 14:34   DG Abd Portable 1V Result Date: 08/06/2023 CLINICAL DATA:  NG tube placement. EXAM: PORTABLE ABDOMEN - 1 VIEW COMPARISON:  CT earlier today FINDINGS: Tip and side port of the enteric tube below the diaphragm in the stomach. Dilated bowel loops in the upper abdomen are partially included in the field of view. IMPRESSION: Tip and side port of the enteric tube below the diaphragm in the stomach. Electronically Signed   By: Andrea Gasman M.D.   On:  08/06/2023 20:14   CT ABDOMEN PELVIS W CONTRAST Addendum Date: 08/06/2023 ADDENDUM REPORT: 08/06/2023 16:20 ADDENDUM: Critical Value/emergent results were called by telephone at the time of interpretation on 08/06/2023 at 4:20 pm to provider DAWOOD [facility] , who verbally acknowledged these results. Electronically Signed   By: [name] Birmingham M.D.   On: 08/06/2023 16:20   Result Date: 08/06/2023 CLINICAL DATA:  History of bowel perforation postoperatively and hydronephrosis. Worsening  leukocytosis. EXAM: CT ABDOMEN AND PELVIS WITH CONTRAST TECHNIQUE: Multidetector CT imaging of the abdomen and pelvis was performed using the standard protocol following bolus administration of intravenous contrast. RADIATION DOSE REDUCTION: This exam was performed according to the departmental dose-optimization program which includes automated exposure control, adjustment of the mA and/or kV according to patient size and/or use of iterative reconstruction technique. CONTRAST:  75mL OMNIPAQUE  IOHEXOL  350 MG/ML SOLN COMPARISON:  07/30/2023. FINDINGS: Lower chest: Multi-vessel coronary artery calcifications are noted. There is a small left pleural effusion. Atelectasis or infiltrate is present at the lung bases bilaterally, greater on the left than on the right. Hepatobiliary: Stable cysts are present in the left lobe of the liver. The gallbladder is without stones. No biliary ductal dilatation is seen. Pancreas: Unremarkable. No pancreatic ductal dilatation or surrounding inflammatory changes. Spleen: Normal in size without focal abnormality. Adrenals/Urinary Tract: The adrenal glands are within normal limits. There is a slightly delayed nephrogram on the left. No renal calculus is seen bilaterally. Scattered hypodensities are noted in the kidneys bilaterally, likely cysts. There is moderate hydronephrosis on the left with a 9 mm calculus in the distal left ureter at the pelvic inlet. No hydronephrosis on the right. The bladder is unremarkable. Stomach/Bowel: The stomach is distended with fluid. Multiple loops of distended small bowel are noted in the abdomen measuring up to 4.3 cm in the mid left abdomen. No definite transition point is seen. There is evidence of partial colectomy with left lower quadrant colostomy. Appendix appears normal. Vascular/Lymphatic: Aortic atherosclerosis. No enlarged abdominal or pelvic lymph nodes. Reproductive: Uterus and bilateral adnexa are unremarkable. Other: Postsurgical changes  are noted in the low anterior abdominal wall. Fat stranding, free air, and free fluid are present in the inferior abdomen and pelvis. There is a complex fluid collection containing air in the pelvis and presacral space. Mild anasarca is noted. Musculoskeletal: Degenerative changes are noted in the thoracolumbar spine. No acute osseous abnormality is seen. IMPRESSION: 1. Status post partial colectomy with left lower quadrant ostomy. Complex free fluid with multiple foci of air, most pronounced in the surgical bed. While findings may be postsurgical, possibility of superimposed infection or bowel leak cannot be excluded. 2. A few mildly distended loops of small bowel in the mid left abdomen with no obvious transition point, possible postoperative ileus versus early or partial small bowel obstruction. 3. Small left pleural effusion with atelectasis or infiltrate at the lung bases. 4. Moderate left hydroureteronephrosis with a 9 mm stone in the distal left ureter at the pelvic inlet, unchanged. 5. Aortic atherosclerosis and coronary artery calcifications. Electronically Signed: By: [name] Birmingham M.D. On: 08/06/2023 16:10      LOS: 9 days   Ole Bourdon, NP [facility] Pager: [phone]  08/08/2023, 12:45 PM

## 2023-08-08 NOTE — Progress Notes (Signed)
 9 Days Post-Op  Subjective: Feels well today. Mittens in place for some confusion overnight.  Having a ton of output from her colostomy since yesterday.  Minimal NGT output.  Is mobilizing per RN.  Objective: Vital signs in last 24 hours: Temp:  [97.9 F (36.6 C)-98.7 F (37.1 C)] 97.9 F (36.6 C) (07/01 0930) Pulse Rate:  [78-95] 82 (07/01 0930) Resp:  [15-99] 20 (07/01 0930) BP: (114-156)/(66-132) 149/74 (07/01 0930) SpO2:  [91 %-100 %] 95 % (07/01 0930) Last BM Date : 08/07/23  Intake/Output from previous day: 06/30 0701 - 07/01 0700 In: -  Out: 2120 [Urine:500; Emesis/NG output:1250; Drains:370] Intake/Output this shift: Total I/O In: -  Out: 50 [Drains:50]  PE: Gen: NAD Lungs: effort nonlabored  Abd: soft, colostomy viable and with good output, mostly liquid.  NGT in place with minimal output currently.  Midline wound with green/blue drainage still and some fibrin, but fascia intact.  IR drain with copious chocolate milk looking output, over 300cc since placement   Lab Results:  Recent Labs    08/07/23 0610 08/08/23 0831  WBC 11.9* 11.1*  HGB 9.2* 9.1*  HCT 28.0* 27.5*  PLT 491* 597*   BMET Recent Labs    08/07/23 0610 08/08/23 0831  NA 136 138  K 3.8 3.2*  CL 99 98  CO2 24 24  GLUCOSE 72 72  BUN 14 15  CREATININE 0.88 0.70  CALCIUM  8.0* 7.6*   PT/INR Recent Labs    08/07/23 0909  LABPROT 15.8*  INR 1.2    CMP     Component Value Date/Time   NA 138 08/08/2023 0831   NA 140 11/10/2007 0000   K 3.2 (L) 08/08/2023 0831   CL 98 08/08/2023 0831   CO2 24 08/08/2023 0831   GLUCOSE 72 08/08/2023 0831   BUN 15 08/08/2023 0831   BUN 10 11/10/2007 0000   CREATININE 0.70 08/08/2023 0831   CREATININE 0.74 06/26/2020 1522   CALCIUM  7.6 (L) 08/08/2023 0831   PROT 5.3 (L) 08/06/2023 0807   ALBUMIN  1.6 (L) 08/06/2023 0807   AST 24 08/06/2023 0807   ALT 17 08/06/2023 0807   ALKPHOS 62 08/06/2023 0807   BILITOT 0.5 08/06/2023 0807   GFRNONAA  >60 08/08/2023 0831   Lipase     Component Value Date/Time   LIPASE <10 (L) 07/30/2023 0936       Studies/Results: ECHOCARDIOGRAM COMPLETE Result Date: 08/07/2023    ECHOCARDIOGRAM REPORT   Patient Name:   Angela [name] Date of Exam: 08/07/2023 Medical Rec #:  [id number]      Height:       64.0 in Accession #:    [id number]     Weight:       136.2 lb Date of Birth:  [birth year]     BSA:          1.662 m Patient Age:    73 years       BP:           137/59 mmHg Patient Gender: F              HR:           79 bpm. Exam Location:  Inpatient Procedure: 2D Echo, Cardiac Doppler and Color Doppler (Both Spectral and Color            Flow Doppler were utilized during procedure). Indications:    CHF  History:        Patient has no prior  history of Echocardiogram examinations.                 CHF; Risk Factors:Hypertension.  Sonographer:    Vella Key Referring Phys: BRAYTON GORMAN LYE IMPRESSIONS  1. Left ventricular ejection fraction, by estimation, is 60 to 65%. The left ventricle has normal function. The left ventricle has no regional wall motion abnormalities. There is mild concentric left ventricular hypertrophy. Left ventricular diastolic parameters were normal.  2. Right ventricular systolic function is normal. The right ventricular size is normal.  3. The mitral valve is normal in structure. Trivial mitral valve regurgitation. No evidence of mitral stenosis.  4. The aortic valve is normal in structure. Aortic valve regurgitation is not visualized. No aortic stenosis is present.  5. The inferior vena cava is normal in size with greater than 50% respiratory variability, suggesting right atrial pressure of 3 mmHg. FINDINGS  Left Ventricle: Left ventricular ejection fraction, by estimation, is 60 to 65%. The left ventricle has normal function. The left ventricle has no regional wall motion abnormalities. The left ventricular internal cavity size was normal in size. There is  mild concentric left ventricular  hypertrophy. Left ventricular diastolic parameters were normal. Right Ventricle: The right ventricular size is normal. No increase in right ventricular wall thickness. Right ventricular systolic function is normal. Left Atrium: Left atrial size was normal in size. Right Atrium: Right atrial size was normal in size. Pericardium: There is no evidence of pericardial effusion. Mitral Valve: The mitral valve is normal in structure. Trivial mitral valve regurgitation. No evidence of mitral valve stenosis. Tricuspid Valve: The tricuspid valve is normal in structure. Tricuspid valve regurgitation is mild . No evidence of tricuspid stenosis. Aortic Valve: The aortic valve is normal in structure. Aortic valve regurgitation is not visualized. No aortic stenosis is present. Pulmonic Valve: The pulmonic valve was normal in structure. Pulmonic valve regurgitation is trivial. No evidence of pulmonic stenosis. Aorta: The aortic root is normal in size and structure. Venous: The inferior vena cava is normal in size with greater than 50% respiratory variability, suggesting right atrial pressure of 3 mmHg. IAS/Shunts: No atrial level shunt detected by color flow Doppler.  LEFT VENTRICLE PLAX 2D LVIDd:         3.20 cm     Diastology LVIDs:         2.90 cm     LV e' medial:    8.70 cm/s LV PW:         1.00 cm     LV E/e' medial:  9.2 LV IVS:        0.90 cm     LV e' lateral:   9.46 cm/s LVOT diam:     1.80 cm     LV E/e' lateral: 8.5 LV SV:         78 LV SV Index:   47 LVOT Area:     2.54 cm  LV Volumes (MOD) LV vol d, MOD A2C: 65.8 ml LV vol d, MOD A4C: 69.6 ml LV vol s, MOD A2C: 25.6 ml LV vol s, MOD A4C: 30.6 ml LV SV MOD A2C:     40.2 ml LV SV MOD A4C:     69.6 ml LV SV MOD BP:      39.5 ml RIGHT VENTRICLE RV Basal diam:  2.90 cm RV S prime:     17.70 cm/s TAPSE (M-mode): 2.5 cm LEFT ATRIUM             Index  RIGHT ATRIUM           Index LA diam:        2.70 cm 1.62 cm/m   RA Area:     12.90 cm LA Vol (A2C):   41.9 ml 25.21  ml/m  RA Volume:   26.20 ml  15.77 ml/m LA Vol (A4C):   37.9 ml 22.81 ml/m LA Biplane Vol: 40.5 ml 24.37 ml/m  AORTIC VALVE LVOT Vmax:   152.00 cm/s LVOT Vmean:  109.000 cm/s LVOT VTI:    0.305 m  AORTA Ao Root diam: 3.00 cm Ao Asc diam:  3.20 cm MITRAL VALVE               TRICUSPID VALVE MV Area (PHT): 2.63 cm    TV Peak grad:   30.2 mmHg MV Decel Time: 288 msec    TV Vmax:        2.75 m/s MV E velocity: 80.20 cm/s MV A velocity: 99.60 cm/s  SHUNTS MV E/A ratio:  0.81        Systemic VTI:  0.30 m                            Systemic Diam: 1.80 cm Aditya Sabharwal Electronically signed by Ria Commander Signature Date/Time: 08/07/2023/3:37:59 PM    Final    CT GUIDED PERITONEAL/RETROPERITONEAL FLUID DRAIN BY PERC CATH Result Date: 08/07/2023 INDICATION: 73 year old with history of 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 Patient has postoperative fluid collections that are concerning for abscess. EXAM: CT-GUIDED PLACEMENT OF PELVIC ABSCESS DRAIN TECHNIQUE: Multidetector CT imaging of the pelvis was performed following the standard protocol without IV contrast. RADIATION DOSE REDUCTION: This exam was performed according to the departmental dose-optimization program which includes automated exposure control, adjustment of the mA and/or kV according to patient size and/or use of iterative reconstruction technique. MEDICATIONS: Moderate sedation ANESTHESIA/SEDATION: Moderate (conscious) sedation was employed during this procedure. A total of Versed  4 mg and Fentanyl  200 mcg was administered intravenously by the radiology nurse. Total intra-service moderate Sedation Time: 31 minutes. The patient's level of consciousness and vital signs were monitored continuously by radiology nursing throughout the procedure under my direct supervision. COMPLICATIONS: None immediate. PROCEDURE: Informed written consent was obtained from the patient after a thorough  discussion of the procedural risks, benefits and alternatives. All questions were addressed. Maximal Sterile Barrier Technique was utilized including caps, mask, sterile gowns, sterile gloves, sterile drape, hand hygiene and skin antiseptic. A timeout was performed prior to the initiation of the procedure. Patient was placed prone on the CT scanner. Images through the pelvis were obtained. Right buttock region was prepped with chlorhexidine  and sterile field was created. Skin and soft tissues were anesthetized with 1% lidocaine . Small incision was made. Using CT guidance, an 18 gauge trocar needle was directed into the pelvic fluid collection using a right transgluteal approach. Gray colored purulent fluid was aspirated. Superstiff Amplatz wire was advanced into the collection. Follow up CT images were obtained. The tract was dilated to accommodate a 12 [place] multipurpose drain. 100 mL of purulent fluid was aspirated. Follow up CT images were obtained. Drain was sutured to skin and attached to a suction bulb. Fluid was sent for culture. FINDINGS: 93 French drain placed from a right transgluteal approach. 100 mL of purulent fluid was removed. IMPRESSION: CT-guided placement of a drain in the pelvic abscess. Electronically Signed   By: Juliene  Philip M.D.   On: 08/07/2023 14:34   DG Abd Portable 1V Result Date: 08/06/2023 CLINICAL DATA:  NG tube placement. EXAM: PORTABLE ABDOMEN - 1 VIEW COMPARISON:  CT earlier today FINDINGS: Tip and side port of the enteric tube below the diaphragm in the stomach. Dilated bowel loops in the upper abdomen are partially included in the field of view. IMPRESSION: Tip and side port of the enteric tube below the diaphragm in the stomach. Electronically Signed   By: Andrea Gasman M.D.   On: 08/06/2023 20:14   CT ABDOMEN PELVIS W CONTRAST Addendum Date: 08/06/2023 ADDENDUM REPORT: 08/06/2023 16:20 ADDENDUM: Critical Value/emergent results were called by telephone at the time of  interpretation on 08/06/2023 at 4:20 pm to provider [facility] , who verbally acknowledged these results. Electronically Signed   By: [name] Birmingham M.D.   On: 08/06/2023 16:20   Result Date: 08/06/2023 CLINICAL DATA:  History of bowel perforation postoperatively and hydronephrosis. Worsening leukocytosis. EXAM: CT ABDOMEN AND PELVIS WITH CONTRAST TECHNIQUE: Multidetector CT imaging of the abdomen and pelvis was performed using the standard protocol following bolus administration of intravenous contrast. RADIATION DOSE REDUCTION: This exam was performed according to the departmental dose-optimization program which includes automated exposure control, adjustment of the mA and/or kV according to patient size and/or use of iterative reconstruction technique. CONTRAST:  75mL OMNIPAQUE  IOHEXOL  350 MG/ML SOLN COMPARISON:  07/30/2023. FINDINGS: Lower chest: Multi-vessel coronary artery calcifications are noted. There is a small left pleural effusion. Atelectasis or infiltrate is present at the lung bases bilaterally, greater on the left than on the right. Hepatobiliary: Stable cysts are present in the left lobe of the liver. The gallbladder is without stones. No biliary ductal dilatation is seen. Pancreas: Unremarkable. No pancreatic ductal dilatation or surrounding inflammatory changes. Spleen: Normal in size without focal abnormality. Adrenals/Urinary Tract: The adrenal glands are within normal limits. There is a slightly delayed nephrogram on the left. No renal calculus is seen bilaterally. Scattered hypodensities are noted in the kidneys bilaterally, likely cysts. There is moderate hydronephrosis on the left with a 9 mm calculus in the distal left ureter at the pelvic inlet. No hydronephrosis on the right. The bladder is unremarkable. Stomach/Bowel: The stomach is distended with fluid. Multiple loops of distended small bowel are noted in the abdomen measuring up to 4.3 cm in the mid left abdomen. No definite  transition point is seen. There is evidence of partial colectomy with left lower quadrant colostomy. Appendix appears normal. Vascular/Lymphatic: Aortic atherosclerosis. No enlarged abdominal or pelvic lymph nodes. Reproductive: Uterus and bilateral adnexa are unremarkable. Other: Postsurgical changes are noted in the low anterior abdominal wall. Fat stranding, free air, and free fluid are present in the inferior abdomen and pelvis. There is a complex fluid collection containing air in the pelvis and presacral space. Mild anasarca is noted. Musculoskeletal: Degenerative changes are noted in the thoracolumbar spine. No acute osseous abnormality is seen. IMPRESSION: 1. Status post partial colectomy with left lower quadrant ostomy. Complex free fluid with multiple foci of air, most pronounced in the surgical bed. While findings may be postsurgical, possibility of superimposed infection or bowel leak cannot be excluded. 2. A few mildly distended loops of small bowel in the mid left abdomen with no obvious transition point, possible postoperative ileus versus early or partial small bowel obstruction. 3. Small left pleural effusion with atelectasis or infiltrate at the lung bases. 4. Moderate left hydroureteronephrosis with a 9 mm stone in the distal left ureter at the  pelvic inlet, unchanged. 5. Aortic atherosclerosis and coronary artery calcifications. Electronically Signed: By: [name] Birmingham M.D. On: 08/06/2023 16:10    Anti-infectives: Anti-infectives (From admission, onward)    Start     Dose/Rate Route Frequency Ordered Stop   08/06/23 1130  acyclovir (ZOVIRAX) 200 MG capsule 400 mg        400 mg Oral 3 times daily 08/06/23 1037 08/13/23 0959   08/06/23 1100  piperacillin -tazobactam (ZOSYN ) IVPB 3.375 g        3.375 g 12.5 mL/hr over 240 Minutes Intravenous Every 8 hours 08/06/23 1051     07/30/23 1800  piperacillin -tazobactam (ZOSYN ) IVPB 3.375 g        3.375 g 12.5 mL/hr over 240 Minutes Intravenous  Every 8 hours 07/30/23 1332 08/04/23 2359   07/30/23 1400  piperacillin -tazobactam (ZOSYN ) IVPB 3.375 g  Status:  Discontinued        3.375 g 100 mL/hr over 30 Minutes Intravenous Every 8 hours 07/30/23 1329 07/30/23 1332   07/30/23 1015  piperacillin -tazobactam (ZOSYN ) IVPB 3.375 g        3.375 g 100 mL/hr over 30 Minutes Intravenous  Once 07/30/23 1013 07/30/23 1311        Assessment/Plan POD 9, s/p ex lap with Hartmann's procedure for stercoral colitis with ischemia and perforation with feculent peritonitis, Dr. Polly 6/22 -can DC NGT and give clear liquid diet given minimal output from NGT today and colostomy has starting working really well.  Had to empty multiple times already today.   -mobilize -spontaneously voiding, DC purewick and mobilize to bathroom -continue abx due to pelvic abscess, WBC down to 11 -BID dressing changes to midline wound with Dakin's for pseudomonas x3 days, can extend to 5 if needed. -WOC following -pulm toilet  -IR drain in place for this pelvic abscess.  Will continue to monitor.  This could be a rectal stump break down, but treatment is control with a drain, so continue this for now and monitor output. -d/w patient and daughter at bedside.  D/w primary on the ward  FEN - Dc NGT/CLD/Breeze VTE - lovenox  ID - zosyn    Nephrolithiasis - per urology, no plans for today Chronic constipation - bowel regimen HTN GLaucoma Anemia - hgb 9.1 this am    LOS: 9 days    Burnard FORBES Banter , [facility] 08/08/2023, 12:34 PM Please see Amion for pager number during day hours 7:00am-4:30pm or 7:00am -11:30am on weekends

## 2023-08-08 NOTE — Plan of Care (Signed)

## 2023-08-08 NOTE — Progress Notes (Signed)
 PROGRESS NOTE  Angela [name] FMW:[id number] DOB: Jul 17, 1950 DOA: 07/30/2023 PCP: Catherine Charlies LABOR, DO   LOS: 9 days   Brief narrative:   Angela [name] is a 73 y.o. female with medical history significant o hypertension, hyperlipidemia, glaucoma and prior tobacco abuse who presents with abdominal pain and bloating. CT abdomen pelvis, which  mild left hydroureteronephrosis and large volume stool burden with stercoral colitis.   Admission was recommended due to concern for sepsis from intra-abdominal infection, but patient declined for which she was prescribed Augmentin  and discharged home.  She presents back with worsening pain, repeat renal CT revealed interval development of intraperitoneal free air and moderate volume free fluid containing layering hyperdensities consistent with bowel perforation, distal left ureteral stone measuring 9 mm with interval decrease left hydroureteronephrosis.  Patient went to surgery for bowel perforation, please see discussion below.    Assessment/Plan: Principal Problem:   Bowel perforation (HCC) Active Problems:   Hydroureteronephrosis   Chronic idiopathic constipation   Metabolic acidosis with increased anion gap and accumulation of organic acids   Hyponatremia   Essential hypertension   Glaucoma   Perforated sigmoid colon (HCC)    Sepsis secondary to bowel perforation  -CT of the abdomen pelvis noting bowel perforation.  Status post exploratory laparotomy with Hartmann procedure for stercoral colitis with ischemia and perforation with feculent peritonitis on 07/30/2023. .   - As of 6/29, worsening leukocytosis, worsening abdominal pain, so repeat imaging has been obtained, significant for pelvic abscess, back on Zosyn  - Status post drain insertion by IR 6/30 - Follow on intraoperative culture and adjust antibiotics as needed.   Postoperative ileus -Encouraged to ambulate, minimize narcotics, replete electrolyte for target ofK > 5, Mg > 2, Phos > 3   -NGT on ILS inserted over the weekend, minimal NGT output - Improved output via colostomy.  Hydroureteronephrosis secondary to left ureteral stone   -Status post explorative laparotomy.  Renal function and urine output normal.  Denies further pain. - Does appear to be having left kidney stone in distal ureter, initially repeat renal ultrasound showing no further hydronephrosis, but upon repeat CT abdomen pelvis 6/29, left hydroureter nephrosis remains with no significant change, neurology consult greatly appreciated, likely will need lithotripsy when more stable.  Odynophagia - Oral exam significant for oral ulcers, improved on Valtrex, no further pain, resolved, continue with Magic mouthwash   Lower extremity edema - this is secondary to volume overload, BNP is elevated, improving . - 2D echo with a preserved EF and normal diastolic function.  Chronic idiopathic constipation Patient is a 47-month history of constipation with hemorrhoids.  CT noted large stool burden on presentation.  Currently status post surgery.  No colostomy output yet.   Metabolic acidosis with elevated anion gap Improved at this time.  Latest bicarbonate of 24.   Hyponatremia Resolved   Essential hypertension Lisinopril  on hold. Blood pressure started to increase, so  started on low-dose Coreg especially with heart rate mildly elevated.  Hospital delirium - Significant confusion overnight, improving, avoid Ativan and minimize narcotics.  Glaucoma Will resume timolol  drops.  DVT prophylaxis: enoxaparin  (LOVENOX ) injection 40 mg Start: 07/31/23 1000 SCDs Start: 07/30/23 1954 SCDs Start: 07/30/23 1953   Disposition: Uncertain at this time.  Likely need rehabilitation.  Status is: Inpatient Remains inpatient appropriate because: Pending clinical improvement, status post surgery, n.p.o. status, likely need for rehab medicine    Code Status:     Code Status: Full Code  Family Communication: Spoke with  sister at bedside  Consultants: General Surgery  Procedures: Exploratory laparotomy with Hartmann procedure on 07/30/2023  Anti-infectives:  Zosyn  IV  Anti-infectives (From admission, onward)    Start     Dose/Rate Route Frequency Ordered Stop   08/06/23 1130  acyclovir (ZOVIRAX) 200 MG capsule 400 mg        400 mg Oral 3 times daily 08/06/23 1037 08/13/23 0959   08/06/23 1100  piperacillin -tazobactam (ZOSYN ) IVPB 3.375 g        3.375 g 12.5 mL/hr over 240 Minutes Intravenous Every 8 hours 08/06/23 1051     07/30/23 1800  piperacillin -tazobactam (ZOSYN ) IVPB 3.375 g        3.375 g 12.5 mL/hr over 240 Minutes Intravenous Every 8 hours 07/30/23 1332 Aug 11, 2023 2359   07/30/23 1400  piperacillin -tazobactam (ZOSYN ) IVPB 3.375 g  Status:  Discontinued        3.375 g 100 mL/hr over 30 Minutes Intravenous Every 8 hours 07/30/23 1329 07/30/23 1332   07/30/23 1015  piperacillin -tazobactam (ZOSYN ) IVPB 3.375 g        3.375 g 100 mL/hr over 30 Minutes Intravenous  Once 07/30/23 1013 07/30/23 1311       Subjective:  Had some significant delirium overnight, was confused this morning  Objective: Vitals:   08/08/23 0614 08/08/23 0930  BP: (!) 152/74 (!) 149/74  Pulse: 89 82  Resp: (!) 24 20  Temp: 98.2 F (36.8 C) 97.9 F (36.6 C)  SpO2: 96% 95%    Intake/Output Summary (Last 24 hours) at 08/08/2023 1416 Last data filed at 08/08/2023 1205 Gross per 24 hour  Intake --  Output 2520 ml  Net -2520 ml    Filed Weights   07/30/23 1405 07/30/23 1930 07/31/23 0300  Weight: 57.2 kg 60 kg 61.8 kg   Body mass index is 23.39 kg/m.   Physical Exam:  Awake Alert, confused Symmetrical Chest wall movement, no wheezing, diminished air entry at the bases +ve B.Sounds, ostomy present liquid brown stool present,, midline surgical wound bandaged  no Cyanosis, Clubbing , +1 edema.   Data Review: I have personally reviewed the following laboratory data and studies,  CBC: Recent Labs   Lab 08-11-2023 0731 08/05/23 0741 08/06/23 0807 08/07/23 0610 08/08/23 0831  WBC 9.8 11.8* 13.0* 11.9* 11.1*  HGB 10.1* 9.8* 9.4* 9.2* 9.1*  HCT 30.5* 29.5* 28.1* 28.0* 27.5*  MCV 86.6 85.5 85.4 87.0 87.3  PLT 371 449* 511* 491* 597*   Basic Metabolic Panel: Recent Labs  Lab 08/02/23 0412 08/03/23 0603 08-11-2023 0731 08/05/23 0741 08/06/23 0807 08/07/23 0610 08/08/23 0831  NA 134*   < > 134* 137 136 136 138  K 3.9   < > 3.7 3.7 4.2 3.8 3.2*  CL 103   < > 104 102 100 99 98  CO2 20*   < > 21* 25 21* 24 24  GLUCOSE 72   < > 126* 115* 91 72 72  BUN 34*   < > 15 14 13 14 15   CREATININE 0.80   < > 0.50 0.50 0.54 0.88 0.70  CALCIUM  8.1*   < > 7.9* 7.9* 8.1* 8.0* 7.6*  MG 1.8  --  1.5*  --  1.7 1.8 1.9  PHOS 2.7  --  2.6  --  3.0 3.6 3.0   < > = values in this interval not displayed.   Liver Function Tests: Recent Labs  Lab 08/06/23 0807  AST 24  ALT 17  ALKPHOS 62  BILITOT 0.5  PROT 5.3*  ALBUMIN  1.6*   No results for input(s): LIPASE, AMYLASE in the last 168 hours.  No results for input(s): AMMONIA in the last 168 hours. Cardiac Enzymes: No results for input(s): CKTOTAL, CKMB, CKMBINDEX, TROPONINI in the last 168 hours. BNP (last 3 results) Recent Labs    08/05/23 0741 08/06/23 1030  BNP 203.7* 313.8*    ProBNP (last 3 results) No results for input(s): PROBNP in the last 8760 hours.  CBG: No results for input(s): GLUCAP in the last 168 hours. Recent Results (from the past 240 hours)  Urine Culture     Status: None   Collection Time: 07/30/23  9:36 AM   Specimen: Urine, Clean Catch  Result Value Ref Range Status   Specimen Description   Final    URINE, CLEAN CATCH Performed at [facility], [address] [place], [place] [address]    Special Requests   Final    NONE Performed at [facility], [address] [place], [place] [address]    Culture   Final    NO GROWTH Performed at [facility], [address] [place], [place] [address]    Report Status 07/31/2023 FINAL  Final  Culture, blood (Routine X 2) w Reflex to ID Panel     Status: None   Collection Time: 07/30/23 11:00 AM   Specimen: BLOOD  Result Value Ref Range Status   Specimen Description   Final    BLOOD LEFT ANTECUBITAL Performed at [facility], [address] [place], [place] [address]    Special Requests   Final    BOTTLES DRAWN AEROBIC AND ANAEROBIC Blood Culture results may not be optimal due to an inadequate volume of blood received in culture bottles Performed at [facility], [address], [place], [place] [address]    Culture   Final    NO GROWTH 5 DAYS Performed at [facility], [address], [place], [place] [address]    Report Status 08/04/2023 FINAL  Final  Culture, blood (Routine X 2) w Reflex to ID Panel     Status: None   Collection Time: 07/30/23 11:01 AM   Specimen: BLOOD  Result Value Ref Range Status   Specimen Description   Final    BLOOD RIGHT ANTECUBITAL Performed at [facility], [address], [place], [place] [address]    Special Requests   Final    BOTTLES DRAWN AEROBIC AND ANAEROBIC Blood Culture results may not be optimal due to an inadequate volume of blood received in culture bottles Performed at [facility], [address] [place], [place] [address]    Culture   Final    NO GROWTH 5 DAYS Performed at [facility], [address] [place], [place] [address]    Report Status 08/04/2023 FINAL  Final  MRSA Next Gen by PCR, Nasal     Status: None   Collection Time: 07/30/23  8:38 PM   Specimen: Nasal Mucosa; Nasal Swab  Result Value Ref Range Status   MRSA by PCR Next Gen NOT DETECTED NOT DETECTED Final    Comment: (NOTE) The GeneXpert MRSA Assay (FDA approved for NASAL specimens only), is one component of a comprehensive MRSA colonization  surveillance program. It is not intended to diagnose MRSA infection nor to guide or monitor treatment for MRSA infections. Test performance is not FDA approved in patients less than 75 years old. Performed at [facility],  [address], [place], [place] [address]   Aerobic/Anaerobic Culture w Gram Stain (surgical/deep wound)     Status: None (Preliminary result)   Collection Time: 08/07/23  1:41 PM   Specimen: Abscess  Result Value Ref Range Status   Specimen Description ABSCESS  Final   Special Requests NONE  Final   Gram Stain   Final    ABUNDANT WBC PRESENT, PREDOMINANTLY PMN ABUNDANT GRAM NEGATIVE RODS ABUNDANT GRAM POSITIVE COCCI Performed at [facility], [address] [place], [place] [address]    Culture   Final    ABUNDANT MULTIPLE ORGANISMS PRESENT, NONE PREDOMINANT   Report Status PENDING  Incomplete     Studies: ECHOCARDIOGRAM COMPLETE Result Date: 08/07/2023    ECHOCARDIOGRAM REPORT   Patient Name:   YUNA PIZZOLATO Date of Exam: 08/07/2023 Medical Rec #:  [id number]      Height:       64.0 in Accession #:    [id number]     Weight:       136.2 lb Date of Birth:  06-05-1950     BSA:          1.662 m Patient Age:    72 years       BP:           137/59 mmHg Patient Gender: F              HR:           79 bpm. Exam Location:  Inpatient Procedure: 2D Echo, Cardiac Doppler and Color Doppler (Both Spectral and Color            Flow Doppler were utilized during procedure). Indications:    CHF  History:        Patient has no prior history of Echocardiogram examinations.                 CHF; Risk Factors:Hypertension.  Sonographer:    Vella Key Referring Phys: BRAYTON GORMAN LYE IMPRESSIONS  1. Left ventricular ejection fraction, by estimation, is 60 to 65%. The left ventricle has normal function. The left ventricle has no regional wall motion abnormalities. There is mild concentric left ventricular hypertrophy. Left ventricular diastolic parameters were normal.  2. Right  ventricular systolic function is normal. The right ventricular size is normal.  3. The mitral valve is normal in structure. Trivial mitral valve regurgitation. No evidence of mitral stenosis.  4. The aortic valve is normal in structure. Aortic valve regurgitation is not visualized. No aortic stenosis is present.  5. The inferior vena cava is normal in size with greater than 50% respiratory variability, suggesting right atrial pressure of 3 mmHg. FINDINGS  Left Ventricle: Left ventricular ejection fraction, by estimation, is 60 to 65%. The left ventricle has normal function. The left ventricle has no regional wall motion abnormalities. The left ventricular internal cavity size was normal in size. There is  mild concentric left ventricular hypertrophy. Left ventricular diastolic parameters were normal. Right Ventricle: The right ventricular size is normal. No increase in right ventricular wall thickness. Right ventricular systolic function is normal. Left Atrium: Left atrial size was normal in size. Right Atrium: Right atrial size was normal in size. Pericardium: There is no evidence of pericardial effusion. Mitral Valve: The mitral valve is normal in structure. Trivial mitral valve regurgitation. No evidence of mitral valve stenosis. Tricuspid Valve: The tricuspid valve is normal in structure. Tricuspid valve regurgitation is mild . No evidence of tricuspid stenosis. Aortic  Valve: The aortic valve is normal in structure. Aortic valve regurgitation is not visualized. No aortic stenosis is present. Pulmonic Valve: The pulmonic valve was normal in structure. Pulmonic valve regurgitation is trivial. No evidence of pulmonic stenosis. Aorta: The aortic root is normal in size and structure. Venous: The inferior vena cava is normal in size with greater than 50% respiratory variability, suggesting right atrial pressure of 3 mmHg. IAS/Shunts: No atrial level shunt detected by color flow Doppler.  LEFT VENTRICLE PLAX 2D LVIDd:          3.20 cm     Diastology LVIDs:         2.90 cm     LV e' medial:    8.70 cm/s LV PW:         1.00 cm     LV E/e' medial:  9.2 LV IVS:        0.90 cm     LV e' lateral:   9.46 cm/s LVOT diam:     1.80 cm     LV E/e' lateral: 8.5 LV SV:         78 LV SV Index:   47 LVOT Area:     2.54 cm  LV Volumes (MOD) LV vol d, MOD A2C: 65.8 ml LV vol d, MOD A4C: 69.6 ml LV vol s, MOD A2C: 25.6 ml LV vol s, MOD A4C: 30.6 ml LV SV MOD A2C:     40.2 ml LV SV MOD A4C:     69.6 ml LV SV MOD BP:      39.5 ml RIGHT VENTRICLE RV Basal diam:  2.90 cm RV S prime:     17.70 cm/s TAPSE (M-mode): 2.5 cm LEFT ATRIUM             Index        RIGHT ATRIUM           Index LA diam:        2.70 cm 1.62 cm/m   RA Area:     12.90 cm LA Vol (A2C):   41.9 ml 25.21 ml/m  RA Volume:   26.20 ml  15.77 ml/m LA Vol (A4C):   37.9 ml 22.81 ml/m LA Biplane Vol: 40.5 ml 24.37 ml/m  AORTIC VALVE LVOT Vmax:   152.00 cm/s LVOT Vmean:  109.000 cm/s LVOT VTI:    0.305 m  AORTA Ao Root diam: 3.00 cm Ao Asc diam:  3.20 cm MITRAL VALVE               TRICUSPID VALVE MV Area (PHT): 2.63 cm    TV Peak grad:   30.2 mmHg MV Decel Time: 288 msec    TV Vmax:        2.75 m/s MV E velocity: 80.20 cm/s MV A velocity: 99.60 cm/s  SHUNTS MV E/A ratio:  0.81        Systemic VTI:  0.30 m                            Systemic Diam: 1.80 cm Aditya Sabharwal Electronically signed by Ria Commander Signature Date/Time: 08/07/2023/3:37:59 PM    Final    CT GUIDED PERITONEAL/RETROPERITONEAL FLUID DRAIN BY PERC CATH Result Date: 08/07/2023 INDICATION: 73 year old with history of 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 Patient has postoperative fluid collections that are concerning for abscess. EXAM: CT-GUIDED PLACEMENT OF PELVIC ABSCESS DRAIN TECHNIQUE: Multidetector CT imaging of  the pelvis was performed following the standard protocol without IV contrast. RADIATION DOSE REDUCTION: This exam was performed  according to the departmental dose-optimization program which includes automated exposure control, adjustment of the mA and/or kV according to patient size and/or use of iterative reconstruction technique. MEDICATIONS: Moderate sedation ANESTHESIA/SEDATION: Moderate (conscious) sedation was employed during this procedure. A total of Versed  4 mg and Fentanyl  200 mcg was administered intravenously by the radiology nurse. Total intra-service moderate Sedation Time: 31 minutes. The patient's level of consciousness and vital signs were monitored continuously by radiology nursing throughout the procedure under my direct supervision. COMPLICATIONS: None immediate. PROCEDURE: Informed written consent was obtained from the patient after a thorough discussion of the procedural risks, benefits and alternatives. All questions were addressed. Maximal Sterile Barrier Technique was utilized including caps, mask, sterile gowns, sterile gloves, sterile drape, hand hygiene and skin antiseptic. A timeout was performed prior to the initiation of the procedure. Patient was placed prone on the CT scanner. Images through the pelvis were obtained. Right buttock region was prepped with chlorhexidine  and sterile field was created. Skin and soft tissues were anesthetized with 1% lidocaine . Small incision was made. Using CT guidance, an 18 gauge trocar needle was directed into the pelvic fluid collection using a right transgluteal approach. Gray colored purulent fluid was aspirated. Superstiff Amplatz wire was advanced into the collection. Follow up CT images were obtained. The tract was dilated to accommodate a 12 [place] multipurpose drain. 100 mL of purulent fluid was aspirated. Follow up CT images were obtained. Drain was sutured to skin and attached to a suction bulb. Fluid was sent for culture. FINDINGS: 58 French drain placed from a right transgluteal approach. 100 mL of purulent fluid was removed. IMPRESSION: CT-guided placement of a  drain in the pelvic abscess. Electronically Signed   By: Juliene Balder M.D.   On: 08/07/2023 14:34   DG Abd Portable 1V Result Date: 08/06/2023 CLINICAL DATA:  NG tube placement. EXAM: PORTABLE ABDOMEN - 1 VIEW COMPARISON:  CT earlier today FINDINGS: Tip and side port of the enteric tube below the diaphragm in the stomach. Dilated bowel loops in the upper abdomen are partially included in the field of view. IMPRESSION: Tip and side port of the enteric tube below the diaphragm in the stomach. Electronically Signed   By: Andrea Gasman M.D.   On: 08/06/2023 20:14   CT ABDOMEN PELVIS W CONTRAST Addendum Date: 08/06/2023 ADDENDUM REPORT: 08/06/2023 16:20 ADDENDUM: Critical Value/emergent results were called by telephone at the time of interpretation on 08/06/2023 at 4:20 pm to provider Myka Hitz [facility] , who verbally acknowledged these results. Electronically Signed   By: [name] Birmingham M.D.   On: 08/06/2023 16:20   Result Date: 08/06/2023 CLINICAL DATA:  History of bowel perforation postoperatively and hydronephrosis. Worsening leukocytosis. EXAM: CT ABDOMEN AND PELVIS WITH CONTRAST TECHNIQUE: Multidetector CT imaging of the abdomen and pelvis was performed using the standard protocol following bolus administration of intravenous contrast. RADIATION DOSE REDUCTION: This exam was performed according to the departmental dose-optimization program which includes automated exposure control, adjustment of the mA and/or kV according to patient size and/or use of iterative reconstruction technique. CONTRAST:  75mL OMNIPAQUE  IOHEXOL  350 MG/ML SOLN COMPARISON:  07/30/2023. FINDINGS: Lower chest: Multi-vessel coronary artery calcifications are noted. There is a small left pleural effusion. Atelectasis or infiltrate is present at the lung bases bilaterally, greater on the left than on the right. Hepatobiliary: Stable cysts are present in the left lobe of the liver. The  gallbladder is without stones. No biliary ductal  dilatation is seen. Pancreas: Unremarkable. No pancreatic ductal dilatation or surrounding inflammatory changes. Spleen: Normal in size without focal abnormality. Adrenals/Urinary Tract: The adrenal glands are within normal limits. There is a slightly delayed nephrogram on the left. No renal calculus is seen bilaterally. Scattered hypodensities are noted in the kidneys bilaterally, likely cysts. There is moderate hydronephrosis on the left with a 9 mm calculus in the distal left ureter at the pelvic inlet. No hydronephrosis on the right. The bladder is unremarkable. Stomach/Bowel: The stomach is distended with fluid. Multiple loops of distended small bowel are noted in the abdomen measuring up to 4.3 cm in the mid left abdomen. No definite transition point is seen. There is evidence of partial colectomy with left lower quadrant colostomy. Appendix appears normal. Vascular/Lymphatic: Aortic atherosclerosis. No enlarged abdominal or pelvic lymph nodes. Reproductive: Uterus and bilateral adnexa are unremarkable. Other: Postsurgical changes are noted in the low anterior abdominal wall. Fat stranding, free air, and free fluid are present in the inferior abdomen and pelvis. There is a complex fluid collection containing air in the pelvis and presacral space. Mild anasarca is noted. Musculoskeletal: Degenerative changes are noted in the thoracolumbar spine. No acute osseous abnormality is seen. IMPRESSION: 1. Status post partial colectomy with left lower quadrant ostomy. Complex free fluid with multiple foci of air, most pronounced in the surgical bed. While findings may be postsurgical, possibility of superimposed infection or bowel leak cannot be excluded. 2. A few mildly distended loops of small bowel in the mid left abdomen with no obvious transition point, possible postoperative ileus versus early or partial small bowel obstruction. 3. Small left pleural effusion with atelectasis or infiltrate at the lung bases. 4.  Moderate left hydroureteronephrosis with a 9 mm stone in the distal left ureter at the pelvic inlet, unchanged. 5. Aortic atherosclerosis and coronary artery calcifications. Electronically Signed: By: [name] Birmingham M.D. On: 08/06/2023 16:10        Brayton Lye, MD  [facility] 08/08/2023  If 7PM-7AM, please contact night-coverage

## 2023-08-08 NOTE — Progress Notes (Signed)
 Referring Provider(s):  Dr. Brayton Elgergawy   Supervising Physician: Jenna Hacker  Patient Status:  [facility] - In-pt  Chief Complaint:  intraabdominal fluid collection s/p percutaneous drain placement 08/07/23 Dr. Philip   Subjective:  Pt seen lying in bed with daughter at bedside. Pt states she is feeling better, no pain at drain site.  All pt and daughter questions answered.    Allergies: Codeine  Medications: Prior to Admission medications   Medication Sig Start Date End Date Taking? Authorizing Provider  albuterol  (VENTOLIN  HFA) 108 (90 Base) MCG/ACT inhaler Inhale 2 puffs into the lungs every 6 (six) hours as needed for wheezing or shortness of breath. 03/03/22  Yes Kuneff, Renee A, DO  Calcium  Polycarbophil (FIBER-CAPS PO) Take 1 capsule by mouth daily at 12 noon.   Yes [provider]  HYDROcodone -acetaminophen  (NORCO/VICODIN) 5-325 MG tablet Take 1 tablet by mouth every 6 (six) hours as needed for moderate pain (pain score 4-6). 07/29/23  Yes Zackowski, Scott, MD  hydrocortisone  (ANUSOL -HC) 25 MG suppository Place 1 suppository (25 mg total) rectally 2 (two) times daily. 06/23/23  Yes Kuneff, Renee A, DO  hydrocortisone  cream 1 % Apply 1 Application topically 2 (two) times daily. 06/08/23  Yes Kuneff, Renee A, DO  latanoprost  (XALATAN ) 0.005 % ophthalmic solution Place 1 drop into both eyes at bedtime. 08/11/17  Yes [provider]  LINZESS 72 MCG capsule Take 72 mcg by mouth daily before breakfast. 06/26/23  Yes [provider]  lisinopril  (ZESTRIL ) 10 MG tablet TAKE 1 TABLET BY MOUTH DAILY 05/22/23  Yes Kuneff, Renee A, DO  Multiple Vitamin (MULTIVITAMIN) tablet Take 1 tablet by mouth daily.   Yes [provider]  ondansetron  (ZOFRAN -ODT) 4 MG disintegrating tablet Take 1 tablet (4 mg total) by mouth every 8 (eight) hours as needed for nausea or vomiting. 07/29/23  Yes Zackowski, Scott, MD  Probiotic Product (DAILY PROBIOTIC PO) Take 1 capsule by  mouth daily at 12 noon.   Yes [provider]  timolol  (TIMOPTIC ) 0.5 % ophthalmic solution Place 1 drop into both eyes 2 (two) times daily. 11/14/19  Yes [provider]  amoxicillin -clavulanate (AUGMENTIN ) 875-125 MG tablet Take 1 tablet by mouth every 12 (twelve) hours. Patient not taking: Reported on 07/31/2023 07/29/23   Zackowski, Scott, MD  atorvastatin  (LIPITOR) 20 MG tablet Take 1 tablet (20 mg total) by mouth at bedtime. Patient not taking: Reported on 02/22/2023 11/30/21   Catherine Fuller A, DO     Vital Signs: BP (!) 149/74 (BP Location: Right Arm)   Pulse 82   Temp 97.9 F (36.6 C) (Oral)   Resp 20   Ht 5' 4 (1.626 m)   Wt 136 lb 3.9 oz (61.8 kg)   SpO2 95%   BMI 23.39 kg/m   Physical Exam Constitutional:      Appearance: Normal appearance.   Skin:    Comments: Transgluteal F/A easily, suture/statlock in place Skin clean, dry, without signs of infection   Output 45 mL, prulent, creamy colored    Neurological:     Mental Status: She is alert and oriented to person, place, and time.   Psychiatric:        Behavior: Behavior normal.      Labs:  CBC: Recent Labs    08/05/23 0741 08/06/23 0807 08/07/23 0610 08/08/23 0831  WBC 11.8* 13.0* 11.9* 11.1*  HGB 9.8* 9.4* 9.2* 9.1*  HCT 29.5* 28.1* 28.0* 27.5*  PLT 449* 511* 491* 597*    COAGS:  Recent Labs    07/29/23 0826 08/07/23 0909  INR 1.1 1.2    BMP: Recent Labs    08/05/23 0741 08/06/23 0807 08/07/23 0610 08/08/23 0831  NA 137 136 136 138  K 3.7 4.2 3.8 3.2*  CL 102 100 99 98  CO2 25 21* 24 24  GLUCOSE 115* 91 72 72  BUN 14 13 14 15   CALCIUM  7.9* 8.1* 8.0* 7.6*  CREATININE 0.50 0.54 0.88 0.70  GFRNONAA >60 >60 >60 >60    LIVER FUNCTION TESTS: Recent Labs    07/29/23 0813 07/30/23 0936 07/31/23 0257 08/06/23 0807  BILITOT 0.5 0.6 0.7 0.5  AST 23 23 16 24   ALT 12 14 10 17   ALKPHOS 105 60 25* 62  PROT 7.0 6.1* 3.6* 5.3*  ALBUMIN  3.5 2.8* 1.6* 1.6*     Assessment and Plan:  Intraabdominal fluid collection s/p percutaneous drain placement 08/07/23 Dr. Philip.    Drain Location: Right transgluteal  Size: Fr size: 12 Fr Date of placement: 08/07/23   Currently to: Drain collection device: suction bulb 24 hour output:  Output by Drain (mL) 08/06/23 0701 - 08/06/23 1900 08/06/23 1901 - 08/07/23 0700 08/07/23 0701 - 08/07/23 1900 08/07/23 1901 - 08/08/23 0700 08/08/23 0701 - 08/08/23 1259  Closed System Drain Right Buttock Bulb (JP) 12 Fr.   270 100 50    Interval imaging/drain manipulation:  None   Current examination: Flushes/aspirates easily.  Insertion site unremarkable. Suture and stat lock in place. Dressed appropriately.   Plan: Continue TID flushes with 5 cc NS. Record output Q shift. Dressing changes QD or PRN if soiled.  Call IR APP or on call IR MD if difficulty flushing or sudden change in drain output.  Repeat imaging/possible drain injection once output < 10 mL/QD (excluding flush material). Consideration for drain removal if output is < 10 mL/QD (excluding flush material), pending discussion with the providing surgical service.  Discharge planning: Please contact IR APP or on call IR MD prior to patient d/c to ensure appropriate follow up plans are in place. Typically patient will follow up with IR clinic 10-14 days post d/c for repeat imaging/possible drain injection. IR scheduler will contact patient with date/time of appointment. Patient will need to flush drain QD with 5 cc NS, record output QD, dressing changes every 2-3 days or earlier if soiled.   IR will continue to follow - please call with questions or concerns.  Electronically Signed: Lavanda JAYSON Jurist, PA-C 08/08/2023, 12:59 PM    I spent a total of 15 Minutes at the the patient's bedside AND on the patient's hospital floor or unit, greater than 50% of which was counseling/coordinating care for intraabdominal fluid collection s/p percutaneous drain  placement 08/07/23 Dr. Philip

## 2023-08-08 NOTE — Progress Notes (Incomplete)
 Initial Nutrition Assessment  DOCUMENTATION CODES:      INTERVENTION:  ***   NUTRITION DIAGNOSIS:     related to   as evidenced by  .    GOAL:        MONITOR:      REASON FOR ASSESSMENT:        ASSESSMENT:   Pt with PMH significant for: HLD, glaucoma, HTN. Presented with c/o abdomial pain and bloating. CT revealed mild L hydroureteronephrosis and large volume stool burden w/ stercoral colitis. She declined admission at that time and was discharged on ABX. Presented back d/t worsening pain. Repeat CT revealed bowel perforation and a distal ureteral stone. Subsequently admitted.  6/21 ED visit; pt declined admission 6/22 admitted; exlap w/ Hartmann's procedure 6/25 advanced to clear liquid diet 6/26 abd xray: some air in stomach, small bowel distention up to about 3.5cm - some stool burden 6/28 advanced to full liquid diet 6/29 worsening abd pain and leukocytosis; CT: pelvic abscess; NPO 6/30 perc drain placed   Average Meal Intake 6/25: 0-50% x2 documented meals 6/27: 50% x2 documented meals   24 Hour Recall B: L: D: Snacks:   Admit Weight: 57.2kg - ?accuracy; weight appears stated/pulled forward Current Weight: 61.8kg   Per chart review, no significant weight changes in last 6-12 months. Has shown 6.3% weight loss in last 10 months, which is not considered clinically significant for the one year or six months time frame.   Needs new weight collection 2/2 no weight in >1 week. Order entered to collect new weight to assess trend. Likely some weight loss due to NPO status and increased estimated needs 2/2 acuity of illness, presence of surgery, and infection.  Drains/Lines: NGT (gastric) placed 6/29 LLQ: colostomy R buttock: JP drain (12Fr): 370 ml x24 hours  Meds: pantoprazole, Miralax, IV ABX  Labs: Na+ 138 (wdl) K+ 3.2 (L) PHOS 3.0 (wdl) Mg 1.9 (wdl) WBC 13.0>11.9>11.1 (H) CBGs 72-72 x24 hours   NUTRITION - FOCUSED PHYSICAL EXAM:  {RD  Focused Exam List:21252}  Diet Order:   Diet Order             Diet NPO time specified Except for: Sips with Meds  Diet effective now             EDUCATION NEEDS:      Skin:  Skin Assessment: Reviewed RN Assessment  Last BM:  6/30 - type 7 x3  Height:  Ht Readings from Last 1 Encounters:  07/30/23 5' 4 (1.626 m)   Weight:  Wt Readings from Last 1 Encounters:  07/31/23 61.8 kg    Ideal Body Weight:  54.55 kg  BMI:  Body mass index is 23.39 kg/m.  Estimated Nutritional Needs:   Kcal:     Protein:     Fluid:     Blair Deaner MS, RD, LDN Registered Dietitian Clinical Nutrition RD Inpatient Contact Info in Amion

## 2023-08-08 NOTE — Telephone Encounter (Signed)
 Post ED Visit - Positive Culture Follow-up  Culture report reviewed by antimicrobial stewardship pharmacist: [facility] [x]  Gillis, [place].D. []  Venetia Gully, Pharm.D., BCPS AQ-ID []  Garrel Crews, Pharm.D., BCPS []  Almarie Lunger, [address].D., BCPS []  [address], [address].D., BCPS, AAHIVP []  Rosaline Bihari, Pharm.D., BCPS, AAHIVP []  Vernell Meier, PharmD, BCPS []  Latanya Hint, PharmD, BCPS []  Donald Medley, PharmD, BCPS []  Rocky Bold, PharmD []  Dorothyann Alert, PharmD, BCPS []  Morene Babe, PharmD  [facility] []  Rosaline Edison, PharmD []  Romona Bliss, PharmD []  Dolphus Roller, PharmD []  Veva Seip, Rph []  Vernell Daunt) Leonce, PharmD []  Eva Allis, PharmD []  Rosaline Millet, PharmD []  Iantha Batch, PharmD []  Arvin Gauss, PharmD []  Wanda Hasting, PharmD []  Ronal Rav, PharmD []  Rocky Slade, PharmD []  Bard Jeans, PharmD   Positive blood culture Treated with amoxicillin -pot clavulanate, organism sensitive to the same.  Pt currently admitted, and no further patient follow-up is required at this time.  Angela [name] 08/08/2023, 9:11 AM

## 2023-08-09 DIAGNOSIS — K631 Perforation of intestine (nontraumatic): Secondary | ICD-10-CM | POA: Diagnosis not present

## 2023-08-09 DIAGNOSIS — I10 Essential (primary) hypertension: Secondary | ICD-10-CM | POA: Diagnosis not present

## 2023-08-09 DIAGNOSIS — N201 Calculus of ureter: Secondary | ICD-10-CM

## 2023-08-09 DIAGNOSIS — N1330 Unspecified hydronephrosis: Secondary | ICD-10-CM | POA: Diagnosis not present

## 2023-08-09 LAB — CBC
HCT: 27.3 % — ABNORMAL LOW (ref 36.0–46.0)
Hemoglobin: 8.9 g/dL — ABNORMAL LOW (ref 12.0–15.0)
MCH: 28.4 pg (ref 26.0–34.0)
MCHC: 32.6 g/dL (ref 30.0–36.0)
MCV: 87.2 fL (ref 80.0–100.0)
Platelets: 588 10*3/uL — ABNORMAL HIGH (ref 150–400)
RBC: 3.13 MIL/uL — ABNORMAL LOW (ref 3.87–5.11)
RDW: 14.8 % (ref 11.5–15.5)
WBC: 9.9 10*3/uL (ref 4.0–10.5)
nRBC: 0 % (ref 0.0–0.2)

## 2023-08-09 LAB — BASIC METABOLIC PANEL WITH GFR
Anion gap: 14 (ref 5–15)
BUN: 13 mg/dL (ref 8–23)
CO2: 24 mmol/L (ref 22–32)
Calcium: 7.7 mg/dL — ABNORMAL LOW (ref 8.9–10.3)
Chloride: 98 mmol/L (ref 98–111)
Creatinine, Ser: 0.6 mg/dL (ref 0.44–1.00)
GFR, Estimated: >60 mL/min (ref >60)
Glucose, Bld: 88 mg/dL (ref 70–99)
Potassium: 4.2 mmol/L (ref 3.5–5.1)
Sodium: 136 mmol/L (ref 135–145)

## 2023-08-09 LAB — AEROBIC/ANAEROBIC CULTURE W GRAM STAIN (SURGICAL/DEEP WOUND)

## 2023-08-09 LAB — MAGNESIUM: Magnesium: 2 mg/dL (ref 1.7–2.4)

## 2023-08-09 LAB — PHOSPHORUS: Phosphorus: 2.8 mg/dL (ref 2.5–4.6)

## 2023-08-09 NOTE — TOC Progression Note (Signed)
 Transition of Care [facility]) - Progression Note    Patient Details  Name: Angela [name] MRN: [id number] Date of Birth: 27-Apr-1950  [facility] CM/SW Contact  Nola Devere Hands, RN Phone Number: 08/09/2023, 9:56 AM  Clinical Narrative:    Patient not medically ready for discharge. TOC Team will continue to follow.         Expected Discharge Plan and Services                                               Social Determinants of Health (SDOH) Interventions SDOH Screenings   Food Insecurity: Patient Declined (08/02/2023)  Housing: Unknown (08/02/2023)  Transportation Needs: Patient Declined (08/02/2023)  Utilities: Patient Declined (08/02/2023)  Alcohol Screen: Low Risk  (04/19/2023)  Depression (PHQ2-9): Low Risk  (06/23/2023)  Financial Resource Strain: Patient Declined (04/19/2023)  Physical Activity: Sufficiently Active (04/19/2023)  Social Connections: Patient Declined (08/02/2023)  Stress: No Stress Concern Present (04/19/2023)  Tobacco Use: Medium Risk (07/30/2023)  Health Literacy: Adequate Health Literacy (04/19/2023)    Readmission Risk Interventions     No data to display

## 2023-08-09 NOTE — Plan of Care (Signed)

## 2023-08-09 NOTE — Progress Notes (Signed)
 10 Days Post-Op Subjective: Patient's mentation appeared back to baseline this morning.  She was alert, oriented, and engaged.  She was sitting up in chair.  She was accompanied by her husband and daughter, who is a member of Building services engineer here at [employer].  Objective: Vital signs in last 24 hours: Temp:  [97.8 F (36.6 C)-98.3 F (36.8 C)] 98.3 F (36.8 C) (07/02 0739) Pulse Rate:  [75-89] 89 (07/02 0739) Resp:  [14-29] 29 (07/02 0739) BP: (115-149)/(65-81) 117/81 (07/02 0739) SpO2:  [91 %-97 %] 97 % (07/02 0739)  Assessment/Plan: # 9 mm left distal ureteral stone Pain is present but stone is localized to the same area as her ostomy.  Now that she is more awake her and her husband shared that the stone was diagnosed a few weeks ago and they have been waiting to get on the other side of her more pressing medical problems before addressing it. Renal function is preserved. Looking for OR time now.  If this can be scheduled while she is in hospital then we will do so, if sooner availability is at [facility] and she is medically stable for discharge, we will complete this on an outpatient basis.  Should her renal function decompensate acutely, will consider urgent stent placement. Urology will follow peripherally.  Case and plan discussed with Dr. Lovie  Intake/Output from previous day: 07/01 0701 - 07/02 0700 In: 230 [P.O.:120; I.V.:10; IV Piggyback:100] Out: 880 [Urine:310; Emesis/NG output:350; Drains:140; Stool:80]  Intake/Output this shift: No intake/output data recorded.  Physical Exam:  General: Sedated CV: No cyanosis Lungs: equal chest rise GU: Purewick in place draining clear yellow urine.  Lab Results: Recent Labs    08/07/23 0610 08/08/23 0831 08/09/23 0546  HGB 9.2* 9.1* 8.9*  HCT 28.0* 27.5* 27.3*   BMET Recent Labs    08/08/23 0831 08/09/23 0546  NA 138 136  K 3.2* 4.2  CL 98 98  CO2 24 24  GLUCOSE 72 88  BUN 15 13  CREATININE 0.70 0.60   CALCIUM  7.6* 7.7*  HGB 9.1* 8.9*  WBC 11.1* 9.9     Studies/Results: ECHOCARDIOGRAM COMPLETE Result Date: 08/07/2023    ECHOCARDIOGRAM REPORT   Patient Name:   LYAN HOLCK Date of Exam: 08/07/2023 Medical Rec #:  [id number]      Height:       64.0 in Accession #:    [id number]     Weight:       136.2 lb Date of Birth:  1950/12/28     BSA:          1.662 m Patient Age:    73 years       BP:           137/59 mmHg Patient Gender: F              HR:           79 bpm. Exam Location:  Inpatient Procedure: 2D Echo, Cardiac Doppler and Color Doppler (Both Spectral and Color            Flow Doppler were utilized during procedure). Indications:    CHF  History:        Patient has no prior history of Echocardiogram examinations.                 CHF; Risk Factors:Hypertension.  Sonographer:    Vella Key Referring Phys: BRAYTON GORMAN LYE IMPRESSIONS  1. Left ventricular ejection fraction, by estimation, is 60  to 65%. The left ventricle has normal function. The left ventricle has no regional wall motion abnormalities. There is mild concentric left ventricular hypertrophy. Left ventricular diastolic parameters were normal.  2. Right ventricular systolic function is normal. The right ventricular size is normal.  3. The mitral valve is normal in structure. Trivial mitral valve regurgitation. No evidence of mitral stenosis.  4. The aortic valve is normal in structure. Aortic valve regurgitation is not visualized. No aortic stenosis is present.  5. The inferior vena cava is normal in size with greater than 50% respiratory variability, suggesting right atrial pressure of 3 mmHg. FINDINGS  Left Ventricle: Left ventricular ejection fraction, by estimation, is 60 to 65%. The left ventricle has normal function. The left ventricle has no regional wall motion abnormalities. The left ventricular internal cavity size was normal in size. There is  mild concentric left ventricular hypertrophy. Left ventricular diastolic parameters  were normal. Right Ventricle: The right ventricular size is normal. No increase in right ventricular wall thickness. Right ventricular systolic function is normal. Left Atrium: Left atrial size was normal in size. Right Atrium: Right atrial size was normal in size. Pericardium: There is no evidence of pericardial effusion. Mitral Valve: The mitral valve is normal in structure. Trivial mitral valve regurgitation. No evidence of mitral valve stenosis. Tricuspid Valve: The tricuspid valve is normal in structure. Tricuspid valve regurgitation is mild . No evidence of tricuspid stenosis. Aortic Valve: The aortic valve is normal in structure. Aortic valve regurgitation is not visualized. No aortic stenosis is present. Pulmonic Valve: The pulmonic valve was normal in structure. Pulmonic valve regurgitation is trivial. No evidence of pulmonic stenosis. Aorta: The aortic root is normal in size and structure. Venous: The inferior vena cava is normal in size with greater than 50% respiratory variability, suggesting right atrial pressure of 3 mmHg. IAS/Shunts: No atrial level shunt detected by color flow Doppler.  LEFT VENTRICLE PLAX 2D LVIDd:         3.20 cm     Diastology LVIDs:         2.90 cm     LV e' medial:    8.70 cm/s LV PW:         1.00 cm     LV E/e' medial:  9.2 LV IVS:        0.90 cm     LV e' lateral:   9.46 cm/s LVOT diam:     1.80 cm     LV E/e' lateral: 8.5 LV SV:         78 LV SV Index:   47 LVOT Area:     2.54 cm  LV Volumes (MOD) LV vol d, MOD A2C: 65.8 ml LV vol d, MOD A4C: 69.6 ml LV vol s, MOD A2C: 25.6 ml LV vol s, MOD A4C: 30.6 ml LV SV MOD A2C:     40.2 ml LV SV MOD A4C:     69.6 ml LV SV MOD BP:      39.5 ml RIGHT VENTRICLE RV Basal diam:  2.90 cm RV S prime:     17.70 cm/s TAPSE (M-mode): 2.5 cm LEFT ATRIUM             Index        RIGHT ATRIUM           Index LA diam:        2.70 cm 1.62 cm/m   RA Area:     12.90 cm LA Vol (A2C):  41.9 ml 25.21 ml/m  RA Volume:   26.20 ml  15.77 ml/m LA Vol  (A4C):   37.9 ml 22.81 ml/m LA Biplane Vol: 40.5 ml 24.37 ml/m  AORTIC VALVE LVOT Vmax:   152.00 cm/s LVOT Vmean:  109.000 cm/s LVOT VTI:    0.305 m  AORTA Ao Root diam: 3.00 cm Ao Asc diam:  3.20 cm MITRAL VALVE               TRICUSPID VALVE MV Area (PHT): 2.63 cm    TV Peak grad:   30.2 mmHg MV Decel Time: 288 msec    TV Vmax:        2.75 m/s MV E velocity: 80.20 cm/s MV A velocity: 99.60 cm/s  SHUNTS MV E/A ratio:  0.81        Systemic VTI:  0.30 m                            Systemic Diam: 1.80 cm Aditya Sabharwal Electronically signed by Ria Commander Signature Date/Time: 08/07/2023/3:37:59 PM    Final    CT GUIDED PERITONEAL/RETROPERITONEAL FLUID DRAIN BY PERC CATH Result Date: 08/07/2023 INDICATION: 73 year old with history of 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 Patient has postoperative fluid collections that are concerning for abscess. EXAM: CT-GUIDED PLACEMENT OF PELVIC ABSCESS DRAIN TECHNIQUE: Multidetector CT imaging of the pelvis was performed following the standard protocol without IV contrast. RADIATION DOSE REDUCTION: This exam was performed according to the departmental dose-optimization program which includes automated exposure control, adjustment of the mA and/or kV according to patient size and/or use of iterative reconstruction technique. MEDICATIONS: Moderate sedation ANESTHESIA/SEDATION: Moderate (conscious) sedation was employed during this procedure. A total of Versed  4 mg and Fentanyl  200 mcg was administered intravenously by the radiology nurse. Total intra-service moderate Sedation Time: 31 minutes. The patient's level of consciousness and vital signs were monitored continuously by radiology nursing throughout the procedure under my direct supervision. COMPLICATIONS: None immediate. PROCEDURE: Informed written consent was obtained from the patient after a thorough discussion of the procedural risks, benefits and  alternatives. All questions were addressed. Maximal Sterile Barrier Technique was utilized including caps, mask, sterile gowns, sterile gloves, sterile drape, hand hygiene and skin antiseptic. A timeout was performed prior to the initiation of the procedure. Patient was placed prone on the CT scanner. Images through the pelvis were obtained. Right buttock region was prepped with chlorhexidine  and sterile field was created. Skin and soft tissues were anesthetized with 1% lidocaine . Small incision was made. Using CT guidance, an 18 gauge trocar needle was directed into the pelvic fluid collection using a right transgluteal approach. Gray colored purulent fluid was aspirated. Superstiff Amplatz wire was advanced into the collection. Follow up CT images were obtained. The tract was dilated to accommodate a 12 [place] multipurpose drain. 100 mL of purulent fluid was aspirated. Follow up CT images were obtained. Drain was sutured to skin and attached to a suction bulb. Fluid was sent for culture. FINDINGS: 52 French drain placed from a right transgluteal approach. 100 mL of purulent fluid was removed. IMPRESSION: CT-guided placement of a drain in the pelvic abscess. Electronically Signed   By: Juliene Balder M.D.   On: 08/07/2023 14:34      LOS: 10 days   Ole Bourdon, NP [facility] Pager: [phone]  08/09/2023, 8:44 AM

## 2023-08-09 NOTE — Progress Notes (Signed)
 PROGRESS NOTE        PATIENT DETAILS Name: Angela [name] Age: 73 y.o. Sex: female Date of Birth: December 23, 1950 Admit Date: 07/30/2023 Admitting Physician Maximino DELENA Sharps, MD ERE:[id number], [name], DO  Brief Summary: Patient is a 73 y.o.  female with history of HTN, HLD who presented with worsening abdominal pain-CT imaging showed intraperitoneal free air consistent with bowel perforation  Significant events: 6/21>> evaluated in the ED for abdominal pain-found to have left mid ureteral stone-9 mm with hydronephrosis-and large volume of stool-managed with supportive care and discharged home. 6/22>> worsening abdominal pain-repeat CT scan consistent with bowel perforation-surgery consult-admit to [facility].  Significant studies: 6/22>> CT renal stone study: Interval development of intraperitoneal free air-moderate volume free fluid-consistent with bowel perforation, persistent large volume stool with dilated sigmoid colon.  Similar location of left ureteral stone-9 mm. 6/29>> CT abdomen/pelvis: S/p partial colectomy-LLQ ostomy-complex free fluid with multiple foci of air-most pronounced in the surgical bed-possibility of superimposed infection or bowel leak cannot be excluded. 6/30>> echo: EF 60-65%.  Significant microbiology data: 6/22>> urine culture: No growth 6/22>> blood culture: No growth 6/30>> pelvic abscess culture: Gram-negative rods/gram-positive cocci  Procedures: 6/22>> sigmoidectomy with end colostomy. 6/30>> CT-guided drain and pelvic abscess by IR  Consults: General Surgery IR Urology  Subjective: Lying comfortably in bed-denies any chest pain or shortness of breath.  Objective: Vitals: Blood pressure 117/81, pulse 89, temperature 98.3 F (36.8 C), temperature source Oral, resp. rate (!) 29, height 5' 4 (1.626 m), weight 61.8 kg, SpO2 97%.   Exam: Gen Exam:Alert awake-not in any distress HEENT:atraumatic, normocephalic Chest: B/L clear to  auscultation anteriorly CVS:S1S2 regular Abdomen: Soft-mildly tender around the incision site-brown stools in ostomy bag. Extremities:++ edema Neurology: Non focal Skin: no rash  Pertinent Labs/Radiology:    Latest Ref Rng & Units 08/09/2023    5:46 AM 08/08/2023    8:31 AM 08/07/2023    6:10 AM  CBC  WBC 4.0 - 10.5 K/uL 9.9  11.1  11.9   Hemoglobin 12.0 - 15.0 g/dL 8.9  9.1  9.2   Hematocrit 36.0 - 46.0 % 27.3  27.5  28.0   Platelets 150 - 400 K/uL 588  597  491     Lab Results  Component Value Date   NA 136 08/09/2023   K 4.2 08/09/2023   CL 98 08/09/2023   CO2 24 08/09/2023     Assessment/Plan: Sepsis secondary to stercoral colitis with ischemia/perforation of sigmoid colon with feculent peritonitis-s/p sigmoid colectomy/ostomy on 6/22-complicated by postoperative ileus and then residual pelvic abscess requiring CT-guided drain placement by on 6/30. Slowly improving-sepsis physiology has resolved-ileus much better-has brown stools in ostomy bag.  Tolerating clear liquid diet. On Zosyn -await final culture data CCS/IR following-await further recommendations.  Hydronephrosis secondary to distal left ureteral stone Urology following-with recommendations for supportive care for now-once her medical problems have stabilized-urology will arrange for lithotripsy versus stent placement. Continue to monitor renal function closely  Hospital delirium Secondary to acute illness/narcotics Completely awake/alert this morning Delirium precautions  Normocytic anemia Due to acute illness Follow CBC-if significant drop-will transfuse  Thrombocytosis Likely reactive Supportive care  HTN BP stable with Coreg Lisinopril  remains on hold Follow/optimize  Lower extremity edema Secondary to hypoalbuminemia/IV fluid resuscitation in the setting of acute illness Echo stable-UA negative for proteinuria Compression stockings for now-once she is a bit  more stable-will attempt gentle diuresis  with IV Lasix.  Chronic idiopathic constipation Bowel function recovering-brown stools Once more stable-will need to be placed on a bowel regimen.  Odynophagia secondary to oral/aphthous ulcers Improved on Valtrex and Magic mouthwash Supportive care  Glaucoma Timolol  eyedrops  Debility/deconditioning PT OT eval-Home health recommended.  Code status:   Code Status: Full Code   DVT Prophylaxis: enoxaparin  (LOVENOX ) injection 40 mg Start: 07/31/23 1000 SCDs Start: 07/30/23 1954 SCDs Start: 07/30/23 1953   Family Communication: Daughter at bedside   Disposition Plan: Status is: Inpatient Remains inpatient appropriate because: Severity of illness   Planned Discharge Destination:Home health   Diet: Diet Order             Diet clear liquid Fluid consistency: Thin  Diet effective now                     Antimicrobial agents: Anti-infectives (From admission, onward)    Start     Dose/Rate Route Frequency Ordered Stop   08/06/23 1130  acyclovir (ZOVIRAX) 200 MG capsule 400 mg  Status:  Discontinued        400 mg Oral 3 times daily 08/06/23 1037 08/08/23 1424   08/06/23 1100  piperacillin -tazobactam (ZOSYN ) IVPB 3.375 g        3.375 g 12.5 mL/hr over 240 Minutes Intravenous Every 8 hours 08/06/23 1051     07/30/23 1800  piperacillin -tazobactam (ZOSYN ) IVPB 3.375 g        3.375 g 12.5 mL/hr over 240 Minutes Intravenous Every 8 hours 07/30/23 1332 08/04/23 2359   07/30/23 1400  piperacillin -tazobactam (ZOSYN ) IVPB 3.375 g  Status:  Discontinued        3.375 g 100 mL/hr over 30 Minutes Intravenous Every 8 hours 07/30/23 1329 07/30/23 1332   07/30/23 1015  piperacillin -tazobactam (ZOSYN ) IVPB 3.375 g        3.375 g 100 mL/hr over 30 Minutes Intravenous  Once 07/30/23 1013 07/30/23 1311        MEDICATIONS: Scheduled Meds:  acetaminophen   1,000 mg Oral Q6H   carvedilol  3.125 mg Oral BID WC   Chlorhexidine  Gluconate Cloth  6 each Topical Daily   enoxaparin   (LOVENOX ) injection  40 mg Subcutaneous Q24H   feeding supplement  1 Container Oral TID BM   latanoprost   1 drop Both Eyes QHS   magic mouthwash  5 mL Oral QID   methocarbamol   500 mg Oral TID   Or   methocarbamol  (ROBAXIN ) injection  500 mg Intravenous TID   pantoprazole (PROTONIX) IV  40 mg Intravenous Q24H   polyethylene glycol  17 g Per Tube Daily   sodium chloride  flush  3 mL Intravenous Q12H   sodium hypochlorite   Irrigation BID   timolol   1 drop Both Eyes BID   Continuous Infusions:  piperacillin -tazobactam (ZOSYN )  IV 3.375 g (08/09/23 0850)   PRN Meds:.albuterol , hydrALAZINE, HYDROmorphone  (DILAUDID ) injection, melatonin, metoprolol  tartrate, ondansetron  **OR** ondansetron  (ZOFRAN ) IV, mouth rinse, oxyCODONE    I have personally reviewed following labs and imaging studies  LABORATORY DATA: CBC: Recent Labs  Lab 08/05/23 0741 08/06/23 0807 08/07/23 0610 08/08/23 0831 08/09/23 0546  WBC 11.8* 13.0* 11.9* 11.1* 9.9  HGB 9.8* 9.4* 9.2* 9.1* 8.9*  HCT 29.5* 28.1* 28.0* 27.5* 27.3*  MCV 85.5 85.4 87.0 87.3 87.2  PLT 449* 511* 491* 597* 588*    Basic Metabolic Panel: Recent Labs  Lab 08/04/23 0731 08/05/23 0741 08/06/23 9192 08/07/23 0610 08/08/23 0831 08/09/23 9453  NA 134* 137 136 136 138 136  K 3.7 3.7 4.2 3.8 3.2* 4.2  CL 104 102 100 99 98 98  CO2 21* 25 21* 24 24 24   GLUCOSE 126* 115* 91 72 72 88  BUN 15 14 13 14 15 13   CREATININE 0.50 0.50 0.54 0.88 0.70 0.60  CALCIUM  7.9* 7.9* 8.1* 8.0* 7.6* 7.7*  MG 1.5*  --  1.7 1.8 1.9 2.0  PHOS 2.6  --  3.0 3.6 3.0 2.8    GFR: Estimated Creatinine Clearance: 56.5 mL/min (by C-G formula based on SCr of 0.6 mg/dL).  Liver Function Tests: Recent Labs  Lab 08/06/23 0807  AST 24  ALT 17  ALKPHOS 62  BILITOT 0.5  PROT 5.3*  ALBUMIN  1.6*   No results for input(s): LIPASE, AMYLASE in the last 168 hours. No results for input(s): AMMONIA in the last 168 hours.  Coagulation Profile: Recent Labs  Lab  08/07/23 0909  INR 1.2    Cardiac Enzymes: No results for input(s): CKTOTAL, CKMB, CKMBINDEX, TROPONINI in the last 168 hours.  BNP (last 3 results) No results for input(s): PROBNP in the last 8760 hours.  Lipid Profile: No results for input(s): CHOL, HDL, LDLCALC, TRIG, CHOLHDL, LDLDIRECT in the last 72 hours.  Thyroid  Function Tests: No results for input(s): TSH, T4TOTAL, FREET4, T3FREE, THYROIDAB in the last 72 hours.  Anemia Panel: No results for input(s): VITAMINB12, FOLATE, FERRITIN, TIBC, IRON, RETICCTPCT in the last 72 hours.  Urine analysis:    Component Value Date/Time   COLORURINE STRAW (A) 08/06/2023 1123   APPEARANCEUR CLEAR 08/06/2023 1123   LABSPEC 1.009 08/06/2023 1123   PHURINE 6.0 08/06/2023 1123   GLUCOSEU NEGATIVE 08/06/2023 1123   HGBUR NEGATIVE 08/06/2023 1123   BILIRUBINUR NEGATIVE 08/06/2023 1123   KETONESUR 5 (A) 08/06/2023 1123   PROTEINUR NEGATIVE 08/06/2023 1123   NITRITE NEGATIVE 08/06/2023 1123   LEUKOCYTESUR NEGATIVE 08/06/2023 1123    Sepsis Labs: Lactic Acid, Venous    Component Value Date/Time   LATICACIDVEN 1.8 07/30/2023 1213    MICROBIOLOGY: Recent Results (from the past 240 hours)  Culture, blood (Routine X 2) w Reflex to ID Panel     Status: None   Collection Time: 07/30/23 11:00 AM   Specimen: BLOOD  Result Value Ref Range Status   Specimen Description   Final    BLOOD LEFT ANTECUBITAL Performed at [facility], [address], [place], [place] [address]    Special Requests   Final    BOTTLES DRAWN AEROBIC AND ANAEROBIC Blood Culture results may not be optimal due to an inadequate volume of blood received in culture bottles Performed at [facility], [address] [place], [place] [address]    Culture   Final    NO GROWTH 5 DAYS Performed at [facility], [address], [place], [place] [address]    Report Status 08/04/2023  FINAL  Final  Culture, blood (Routine X 2) w Reflex to ID Panel     Status: None   Collection Time: 07/30/23 11:01 AM   Specimen: BLOOD  Result Value Ref Range Status   Specimen Description   Final    BLOOD RIGHT ANTECUBITAL Performed at [facility], [address], [place], [place] [address]    Special Requests   Final    BOTTLES DRAWN AEROBIC AND ANAEROBIC Blood Culture results may not be optimal due to an inadequate volume of blood received in culture bottles Performed at [facility], [address], [place], [place] [address]    Culture   Final    NO GROWTH 5 DAYS Performed at [facility], [address], [place], [place] [address]    Report Status 08/04/2023 FINAL  Final  MRSA Next Gen by PCR, Nasal     Status: None   Collection Time: 07/30/23  8:38 PM   Specimen: Nasal Mucosa; Nasal Swab  Result Value Ref Range Status   MRSA by PCR Next Gen NOT DETECTED NOT DETECTED Final    Comment: (NOTE) The GeneXpert MRSA Assay (FDA approved for NASAL specimens only), is one component of a comprehensive MRSA colonization surveillance program. It is not intended to diagnose MRSA infection nor to guide or monitor treatment for MRSA infections. Test performance is not FDA approved in patients less than 36 years old. Performed at [facility], [address] [place], [place] [address]   Aerobic/Anaerobic Culture w Gram Stain (surgical/deep wound)     Status: None (Preliminary result)   Collection Time: 08/07/23  1:41 PM   Specimen: Abscess  Result Value Ref Range Status   Specimen Description ABSCESS  Final   Special Requests NONE  Final   Gram Stain   Final    ABUNDANT WBC PRESENT, PREDOMINANTLY PMN ABUNDANT GRAM NEGATIVE RODS ABUNDANT GRAM POSITIVE COCCI Performed at [facility], [address], [place], [place] [address]    Culture   Final    ABUNDANT MULTIPLE ORGANISMS PRESENT, NONE PREDOMINANT   Report Status  PENDING  Incomplete    RADIOLOGY STUDIES/RESULTS: ECHOCARDIOGRAM COMPLETE Result Date: 08/07/2023    ECHOCARDIOGRAM REPORT   Patient Name:   Angela [name] Date of Exam: 08/07/2023 Medical Rec #:  [id number]      Height:       64.0 in Accession #:    [id number]     Weight:       136.2 lb Date of Birth:  [birth year]     BSA:          1.662 m Patient Age:    72 years       BP:           137/59 mmHg Patient Gender: F              HR:           79 bpm. Exam Location:  Inpatient Procedure: 2D Echo, Cardiac Doppler and Color Doppler (Both Spectral and Color            Flow Doppler were utilized during procedure). Indications:    CHF  History:        Patient has no prior history of Echocardiogram examinations.                 CHF; Risk Factors:Hypertension.  Sonographer:    Vella Key Referring Phys: BRAYTON GORMAN LYE IMPRESSIONS  1. Left ventricular ejection fraction, by estimation, is 60 to 65%. The left ventricle has normal function. The left ventricle has no regional wall motion abnormalities. There is mild concentric left ventricular hypertrophy. Left ventricular diastolic parameters were normal.  2. Right ventricular systolic function is normal. The right ventricular size is normal.  3. The mitral valve is normal in structure. Trivial mitral valve regurgitation. No evidence of mitral stenosis.  4. The aortic valve is normal in structure. Aortic valve regurgitation is not visualized. No aortic stenosis is present.  5. The inferior vena cava is normal in size with greater than  50% respiratory variability, suggesting right atrial pressure of 3 mmHg. FINDINGS  Left Ventricle: Left ventricular ejection fraction, by estimation, is 60 to 65%. The left ventricle has normal function. The left ventricle has no regional wall motion abnormalities. The left ventricular internal cavity size was normal in size. There is  mild concentric left ventricular hypertrophy. Left ventricular diastolic parameters were normal. Right  Ventricle: The right ventricular size is normal. No increase in right ventricular wall thickness. Right ventricular systolic function is normal. Left Atrium: Left atrial size was normal in size. Right Atrium: Right atrial size was normal in size. Pericardium: There is no evidence of pericardial effusion. Mitral Valve: The mitral valve is normal in structure. Trivial mitral valve regurgitation. No evidence of mitral valve stenosis. Tricuspid Valve: The tricuspid valve is normal in structure. Tricuspid valve regurgitation is mild . No evidence of tricuspid stenosis. Aortic Valve: The aortic valve is normal in structure. Aortic valve regurgitation is not visualized. No aortic stenosis is present. Pulmonic Valve: The pulmonic valve was normal in structure. Pulmonic valve regurgitation is trivial. No evidence of pulmonic stenosis. Aorta: The aortic root is normal in size and structure. Venous: The inferior vena cava is normal in size with greater than 50% respiratory variability, suggesting right atrial pressure of 3 mmHg. IAS/Shunts: No atrial level shunt detected by color flow Doppler.  LEFT VENTRICLE PLAX 2D LVIDd:         3.20 cm     Diastology LVIDs:         2.90 cm     LV e' medial:    8.70 cm/s LV PW:         1.00 cm     LV E/e' medial:  9.2 LV IVS:        0.90 cm     LV e' lateral:   9.46 cm/s LVOT diam:     1.80 cm     LV E/e' lateral: 8.5 LV SV:         78 LV SV Index:   47 LVOT Area:     2.54 cm  LV Volumes (MOD) LV vol d, MOD A2C: 65.8 ml LV vol d, MOD A4C: 69.6 ml LV vol s, MOD A2C: 25.6 ml LV vol s, MOD A4C: 30.6 ml LV SV MOD A2C:     40.2 ml LV SV MOD A4C:     69.6 ml LV SV MOD BP:      39.5 ml RIGHT VENTRICLE RV Basal diam:  2.90 cm RV S prime:     17.70 cm/s TAPSE (M-mode): 2.5 cm LEFT ATRIUM             Index        RIGHT ATRIUM           Index LA diam:        2.70 cm 1.62 cm/m   RA Area:     12.90 cm LA Vol (A2C):   41.9 ml 25.21 ml/m  RA Volume:   26.20 ml  15.77 ml/m LA Vol (A4C):   37.9 ml  22.81 ml/m LA Biplane Vol: 40.5 ml 24.37 ml/m  AORTIC VALVE LVOT Vmax:   152.00 cm/s LVOT Vmean:  109.000 cm/s LVOT VTI:    0.305 m  AORTA Ao Root diam: 3.00 cm Ao Asc diam:  3.20 cm MITRAL VALVE               TRICUSPID VALVE MV Area (PHT): 2.63 cm    TV Peak grad:  30.2 mmHg MV Decel Time: 288 msec    TV Vmax:        2.75 m/s MV E velocity: 80.20 cm/s MV A velocity: 99.60 cm/s  SHUNTS MV E/A ratio:  0.81        Systemic VTI:  0.30 m                            Systemic Diam: 1.80 cm Aditya Sabharwal Electronically signed by Ria Commander Signature Date/Time: 08/07/2023/3:37:59 PM    Final    CT GUIDED PERITONEAL/RETROPERITONEAL FLUID DRAIN BY PERC CATH Result Date: 08/07/2023 INDICATION: 73 year old with history of 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 Patient has postoperative fluid collections that are concerning for abscess. EXAM: CT-GUIDED PLACEMENT OF PELVIC ABSCESS DRAIN TECHNIQUE: Multidetector CT imaging of the pelvis was performed following the standard protocol without IV contrast. RADIATION DOSE REDUCTION: This exam was performed according to the departmental dose-optimization program which includes automated exposure control, adjustment of the mA and/or kV according to patient size and/or use of iterative reconstruction technique. MEDICATIONS: Moderate sedation ANESTHESIA/SEDATION: Moderate (conscious) sedation was employed during this procedure. A total of Versed  4 mg and Fentanyl  200 mcg was administered intravenously by the radiology nurse. Total intra-service moderate Sedation Time: 31 minutes. The patient's level of consciousness and vital signs were monitored continuously by radiology nursing throughout the procedure under my direct supervision. COMPLICATIONS: None immediate. PROCEDURE: Informed written consent was obtained from the patient after a thorough discussion of the procedural risks, benefits and alternatives. All  questions were addressed. Maximal Sterile Barrier Technique was utilized including caps, mask, sterile gowns, sterile gloves, sterile drape, hand hygiene and skin antiseptic. A timeout was performed prior to the initiation of the procedure. Patient was placed prone on the CT scanner. Images through the pelvis were obtained. Right buttock region was prepped with chlorhexidine  and sterile field was created. Skin and soft tissues were anesthetized with 1% lidocaine . Small incision was made. Using CT guidance, an 18 gauge trocar needle was directed into the pelvic fluid collection using a right transgluteal approach. Gray colored purulent fluid was aspirated. Superstiff Amplatz wire was advanced into the collection. Follow up CT images were obtained. The tract was dilated to accommodate a 12 [place] multipurpose drain. 100 mL of purulent fluid was aspirated. Follow up CT images were obtained. Drain was sutured to skin and attached to a suction bulb. Fluid was sent for culture. FINDINGS: 70 French drain placed from a right transgluteal approach. 100 mL of purulent fluid was removed. IMPRESSION: CT-guided placement of a drain in the pelvic abscess. Electronically Signed   By: Juliene Balder M.D.   On: 08/07/2023 14:34     LOS: 10 days   Donalda Applebaum, MD  [facility]    To contact the attending provider between 7A-7P or the covering provider during after hours 7P-7A, please log into the web site [email or web address] and access using universal [place] password for that web site. If you do not have the password, please call the hospital operator.  08/09/2023, 9:44 AM

## 2023-08-09 NOTE — Progress Notes (Signed)
 Occupational Therapy Treatment Patient Details Name: Angela [name] MRN: [id number] DOB: September 14, 1950 Today's Date: 08/09/2023   History of present illness Angela [name] is a 73 y.o. female who presents with abdominal pain and bloating  Renal CT revealed interval development of intraperitoneal free air and moderate volume free fluid containing layering hyperdensities consistent with bowel perforation, distal left ureteral stone measuring 9 mm with interval decrease left hydroureteronephrosis. . Past medical history significant of hypertension, hyperlipidemia, glaucoma and prior tobacco abuse   OT comments  Pt continuing to progress towards pt focused goals. With motivation from OT, pt was able to complete 283ft hall ambulation with CGA +RW. Tolerating a total of 18 mins OOB activity to ambulate and complete ADLs. She was fatigue by end of session, needing more assist for LBD ADLs. OT to continue working on pt activity tolerance and promote ind. Patient would benefit from post acute Home OT services to help maximize functional independence in natural environment       If plan is discharge home, recommend the following:  A little help with bathing/dressing/bathroom;Assistance with cooking/housework   Equipment Recommendations  Tub/shower seat    Recommendations for Other Services      Precautions / Restrictions Precautions Precautions: Fall;Other (comment) Recall of Precautions/Restrictions: Intact Precaution/Restrictions Comments: New ostomy bag and JP drain Restrictions Weight Bearing Restrictions Per Provider Order: No       Mobility Bed Mobility               General bed mobility comments: In recliner on arrival    Transfers Overall transfer level: Needs assistance Equipment used: Rolling walker (2 wheels) Transfers: Sit to/from Stand Sit to Stand: Contact guard assist                 Balance Overall balance assessment: Needs assistance Sitting-balance support:  Bilateral upper extremity supported, Feet supported Sitting balance-Leahy Scale: Good     Standing balance support: Bilateral upper extremity supported, During functional activity Standing balance-Leahy Scale: Fair                             ADL either performed or assessed with clinical judgement   ADL       Grooming: Standing;Wash/dry face;Supervision/safety               Lower Body Dressing: Total assistance;Sitting/lateral leans Lower Body Dressing Details (indicate cue type and reason): doff/don bilat socks, pt needed assist today given fatigue. Toilet Transfer: Contact guard assist;Rolling walker (2 wheels);Ambulation;Comfort height toilet   Toileting- Clothing Manipulation and Hygiene: Sitting/lateral lean;Contact guard assist       Functional mobility during ADLs: Contact guard assist;Rolling walker (2 wheels)      Extremity/Trunk Assessment Upper Extremity Assessment Upper Extremity Assessment: Generalized weakness            Vision       Perception     Praxis     Communication Communication Communication: No apparent difficulties   Cognition Arousal: Alert Behavior During Therapy: Flat affect Cognition: No apparent impairments                               Following commands: Intact        Cueing   Cueing Techniques: Verbal cues  Exercises      Shoulder Instructions       General Comments VSS    Pertinent Vitals/ Pain  Pain Assessment Pain Assessment: Faces Faces Pain Scale: Hurts a little bit Pain Location: abdomen Pain Descriptors / Indicators: Discomfort Pain Intervention(s): Monitored during session  Home Living                                          Prior Functioning/Environment              Frequency  Min 2X/week        Progress Toward Goals  OT Goals(current goals can now be found in the care plan section)  Progress towards OT goals: Progressing toward  goals  Acute Rehab OT Goals Patient Stated Goal: To return home; get pain down OT Goal Formulation: With patient Time For Goal Achievement: 08/16/23 Potential to Achieve Goals: Good  Plan      Co-evaluation                 AM-PAC OT 6 Clicks Daily Activity     Outcome Measure   Help from another person eating meals?: None Help from another person taking care of personal grooming?: A Little Help from another person toileting, which includes using toliet, bedpan, or urinal?: A Little Help from another person bathing (including washing, rinsing, drying)?: A Little Help from another person to put on and taking off regular upper body clothing?: A Little Help from another person to put on and taking off regular lower body clothing?: A Little 6 Click Score: 19    End of Session Equipment Utilized During Treatment: Rolling walker (2 wheels);Gait belt  OT Visit Diagnosis: Unsteadiness on feet (R26.81);Other abnormalities of gait and mobility (R26.89);Pain;Muscle weakness (generalized) (M62.81) Pain - part of body:  (abdomen)   Activity Tolerance Patient tolerated treatment well   Patient Left with call bell/phone within reach;with family/visitor present;in chair   Nurse Communication Mobility status        Time: 8572-8546 OT Time Calculation (min): 26 min  Charges: OT General Charges $OT Visit: 1 Visit OT Treatments $Self Care/Home Management : 8-22 mins $Therapeutic Activity: 8-22 mins  08/09/2023  AB, OTR/L  Acute Rehabilitation Services  Office: [phone]   Angela [name] 08/09/2023, 3:12 PM

## 2023-08-09 NOTE — Plan of Care (Signed)

## 2023-08-10 DIAGNOSIS — N201 Calculus of ureter: Secondary | ICD-10-CM | POA: Diagnosis not present

## 2023-08-10 DIAGNOSIS — K631 Perforation of intestine (nontraumatic): Secondary | ICD-10-CM | POA: Diagnosis not present

## 2023-08-10 DIAGNOSIS — I10 Essential (primary) hypertension: Secondary | ICD-10-CM | POA: Diagnosis not present

## 2023-08-10 DIAGNOSIS — N1330 Unspecified hydronephrosis: Secondary | ICD-10-CM | POA: Diagnosis not present

## 2023-08-10 LAB — COMPREHENSIVE METABOLIC PANEL WITH GFR
ALT: 12 U/L (ref 0–44)
AST: 17 U/L (ref 15–41)
Albumin: 1.6 g/dL — ABNORMAL LOW (ref 3.5–5.0)
Alkaline Phosphatase: 75 U/L (ref 38–126)
Anion gap: 13 (ref 5–15)
BUN: 16 mg/dL (ref 8–23)
CO2: 26 mmol/L (ref 22–32)
Calcium: 7.8 mg/dL — ABNORMAL LOW (ref 8.9–10.3)
Chloride: 95 mmol/L — ABNORMAL LOW (ref 98–111)
Creatinine, Ser: 0.56 mg/dL (ref 0.44–1.00)
GFR, Estimated: >60 mL/min (ref >60)
Glucose, Bld: 84 mg/dL (ref 70–99)
Potassium: 3.5 mmol/L (ref 3.5–5.1)
Sodium: 134 mmol/L — ABNORMAL LOW (ref 135–145)
Total Bilirubin: 0.8 mg/dL (ref 0.0–1.2)
Total Protein: 5 g/dL — ABNORMAL LOW (ref 6.5–8.1)

## 2023-08-10 LAB — CBC
HCT: 26.6 % — ABNORMAL LOW (ref 36.0–46.0)
Hemoglobin: 8.7 g/dL — ABNORMAL LOW (ref 12.0–15.0)
MCH: 28.6 pg (ref 26.0–34.0)
MCHC: 32.7 g/dL (ref 30.0–36.0)
MCV: 87.5 fL (ref 80.0–100.0)
Platelets: 701 10*3/uL — ABNORMAL HIGH (ref 150–400)
RBC: 3.04 MIL/uL — ABNORMAL LOW (ref 3.87–5.11)
RDW: 14.7 % (ref 11.5–15.5)
WBC: 7.7 10*3/uL (ref 4.0–10.5)
nRBC: 0 % (ref 0.0–0.2)

## 2023-08-10 LAB — PHOSPHORUS: Phosphorus: 3.2 mg/dL (ref 2.5–4.6)

## 2023-08-10 LAB — MAGNESIUM: Magnesium: 1.6 mg/dL — ABNORMAL LOW (ref 1.7–2.4)

## 2023-08-10 MED ORDER — FUROSEMIDE 10 MG/ML IJ SOLN
20.0000 mg | Freq: Once | INTRAMUSCULAR | Status: AC
  Administered 2023-08-10: 20 mg via INTRAVENOUS
  Filled 2023-08-10: qty 2

## 2023-08-10 MED ORDER — MAGNESIUM SULFATE 4 GM/100ML IV SOLN
4.0000 g | Freq: Once | INTRAVENOUS | Status: AC
  Administered 2023-08-10: 4 g via INTRAVENOUS
  Filled 2023-08-10: qty 100

## 2023-08-10 MED ORDER — PANTOPRAZOLE SODIUM 40 MG PO TBEC
40.0000 mg | DELAYED_RELEASE_TABLET | Freq: Every day | ORAL | Status: DC
  Administered 2023-08-10 – 2023-08-11 (×2): 40 mg via ORAL
  Filled 2023-08-10 (×2): qty 1

## 2023-08-10 MED ORDER — POTASSIUM CHLORIDE CRYS ER 20 MEQ PO TBCR
40.0000 meq | EXTENDED_RELEASE_TABLET | Freq: Once | ORAL | Status: AC
  Administered 2023-08-10: 40 meq via ORAL
  Filled 2023-08-10: qty 2

## 2023-08-10 NOTE — Consult Note (Signed)
 WOC Nurse ostomy follow up Stoma type/location: LLQ colostomy Stomal assessment/size: 1 3/4 inch stoma, round, red, moist, os at center Peristomal assessment: intact Treatment options for stomal/peristomal skin: 2 inch barrier ring Output Brown stool Ostomy pouching: 2 pc flat with barrier ring  Education provided:  Explained stoma characteristics (budded, flush, color, texture, care) Demonstrated pouch change (cutting new skin barrier, measuring stoma, cleaning peristomal skin and stoma, use of barrier ring) Education on emptying when 1/3 to 1/2 full and how to empty Demonstrated use of wick to clean spout  Discussed bathing, diet, gas, medication use, constipation Discussed risk of peristomal hernia Provided patient with [employer] and marked items currently using Answered patient/family questions.  Spouse present during teaching session and performed removal of pouch, cutting of skin barrier, application of barrier ring, and application of new pouch.     Enrolled patient in [place] Secure Start Discharge program: Yes  WOC team will continue to follow for ostomy teaching.  Doyal Polite, RN, MSN, [facility] WOC Team

## 2023-08-10 NOTE — Progress Notes (Signed)
PHARMACIST - PHYSICIAN COMMUNICATION  DR:   Jerral Ralph   CONCERNING: IV to Oral Route Change Policy  RECOMMENDATION: This patient is receiving protonix by the intravenous route.  Based on criteria approved by the Pharmacy and Therapeutics Committee, the intravenous medication(s) is/are being converted to the equivalent oral dose form(s).   DESCRIPTION: These criteria include: The patient is eating (either orally or via tube) and/or has been taking other orally administered medications for a least 24 hours The patient has no evidence of active gastrointestinal bleeding or impaired GI absorption (gastrectomy, short bowel, patient on TNA or NPO).  If you have questions about this conversion, please contact the Pharmacy Department  []   [phone] )  [facility] []   [phone] )  [facility] [x]   [phone] )  [facility] []   [phone] )  [facility] []   [phone] )  [facility]

## 2023-08-10 NOTE — Progress Notes (Signed)
 11 Days Post-Op  Subjective: Feeling better today, but feels more distended and having burping.  Walking well.  Nephew at bedside.  Having a lot of output from colostomy.  Objective: Vital signs in last 24 hours: Temp:  [97.2 F (36.2 C)-97.9 F (36.6 C)] 97.2 F (36.2 C) (07/03 0928) Pulse Rate:  [71-79] 76 (07/03 0929) Resp:  [14-22] 18 (07/03 0929) BP: (117-134)/(61-77) 134/77 (07/03 0929) SpO2:  [92 %-98 %] 98 % (07/03 0929) Last BM Date : 08/09/23  Intake/Output from previous day: 07/02 0701 - 07/03 0700 In: 480 [P.O.:480] Out: 205 [Urine:200; Drains:5] Intake/Output this shift: No intake/output data recorded.  PE: Gen: NAD Lungs: effort nonlabored  Abd: soft, colostomy viable and with good output, solid stool.  Distended.  Midline wound fascia intact and no further pseudomonal drainage.  IR drain with minimal more thin, but still cloudy/slightly tan, 5cc of output   Lab Results:  Recent Labs    08/09/23 0546 08/10/23 0554  WBC 9.9 7.7  HGB 8.9* 8.7*  HCT 27.3* 26.6*  PLT 588* 701*   BMET Recent Labs    08/09/23 0546 08/10/23 0554  NA 136 134*  K 4.2 3.5  CL 98 95*  CO2 24 26  GLUCOSE 88 84  BUN 13 16  CREATININE 0.60 0.56  CALCIUM  7.7* 7.8*   PT/INR No results for input(s): LABPROT, INR in the last 72 hours.   CMP     Component Value Date/Time   NA 134 (L) 08/10/2023 0554   NA 140 11/10/2007 0000   K 3.5 08/10/2023 0554   CL 95 (L) 08/10/2023 0554   CO2 26 08/10/2023 0554   GLUCOSE 84 08/10/2023 0554   BUN 16 08/10/2023 0554   BUN 10 11/10/2007 0000   CREATININE 0.56 08/10/2023 0554   CREATININE 0.74 06/26/2020 1522   CALCIUM  7.8 (L) 08/10/2023 0554   PROT 5.0 (L) 08/10/2023 0554   ALBUMIN  1.6 (L) 08/10/2023 0554   AST 17 08/10/2023 0554   ALT 12 08/10/2023 0554   ALKPHOS 75 08/10/2023 0554   BILITOT 0.8 08/10/2023 0554   GFRNONAA >60 08/10/2023 0554   Lipase     Component Value Date/Time   LIPASE <10 (L) 07/30/2023  0936       Studies/Results: No results found.   Anti-infectives: Anti-infectives (From admission, onward)    Start     Dose/Rate Route Frequency Ordered Stop   08/06/23 1130  acyclovir (ZOVIRAX) 200 MG capsule 400 mg  Status:  Discontinued        400 mg Oral 3 times daily 08/06/23 1037 08/08/23 1424   08/06/23 1100  piperacillin -tazobactam (ZOSYN ) IVPB 3.375 g        3.375 g 12.5 mL/hr over 240 Minutes Intravenous Every 8 hours 08/06/23 1051     07/30/23 1800  piperacillin -tazobactam (ZOSYN ) IVPB 3.375 g        3.375 g 12.5 mL/hr over 240 Minutes Intravenous Every 8 hours 07/30/23 1332 08/04/23 2359   07/30/23 1400  piperacillin -tazobactam (ZOSYN ) IVPB 3.375 g  Status:  Discontinued        3.375 g 100 mL/hr over 30 Minutes Intravenous Every 8 hours 07/30/23 1329 07/30/23 1332   07/30/23 1015  piperacillin -tazobactam (ZOSYN ) IVPB 3.375 g        3.375 g 100 mL/hr over 30 Minutes Intravenous  Once 07/30/23 1013 07/30/23 1311        Assessment/Plan POD 11, s/p ex lap with Hartmann's procedure for stercoral colitis with ischemia and  perforation with feculent peritonitis, Dr. Polly 6/22 -will decrease diet back down to CLD from FLD due to abdominal distention  -mobilize -continue abx due to pelvic abscess, WBC normal though -BID dressing changes to midline wound  -WOC following -pulm toilet  -IR drain in place for this pelvic abscess.  Will continue to monitor.  This could be a rectal stump break down, but treatment is control with a drain, so continue this for now and monitor output.  D/w patient and her nephew at bedside today. -d/w primary  FEN - CLD/Breeze VTE - lovenox  ID - zosyn    Nephrolithiasis - per urology, no plans for today Chronic constipation - bowel regimen HTN GLaucoma Anemia - hgb 8.7 this am    LOS: 11 days    Burnard FORBES Banter , [facility] Surgery 08/10/2023, 1:29 PM Please see Amion for pager number during day hours 7:00am-4:30pm or  7:00am -11:30am on weekends

## 2023-08-10 NOTE — Plan of Care (Signed)

## 2023-08-10 NOTE — Progress Notes (Signed)
 PROGRESS NOTE        PATIENT DETAILS Name: Angela [name] Age: 73 y.o. Sex: female Date of Birth: 07-Jun-1950 Admit Date: 07/30/2023 Admitting Physician Maximino DELENA Sharps, MD ERE:[id number], [name], DO  Brief Summary: Patient is a 73 y.o.  female with history of HTN, HLD who presented with worsening abdominal pain-CT imaging showed intraperitoneal free air consistent with bowel perforation  Significant events: 6/21>> evaluated in the ED for abdominal pain-found to have left mid ureteral stone-9 mm with hydronephrosis-and large volume of stool-managed with supportive care and discharged home. 6/22>> worsening abdominal pain-repeat CT scan consistent with bowel perforation-surgery consult-admit to [facility].  Significant studies: 6/22>> CT renal stone study: Interval development of intraperitoneal free air-moderate volume free fluid-consistent with bowel perforation, persistent large volume stool with dilated sigmoid colon.  Similar location of left ureteral stone-9 mm. 6/29>> CT abdomen/pelvis: S/p partial colectomy-LLQ ostomy-complex free fluid with multiple foci of air-most pronounced in the surgical bed-possibility of superimposed infection or bowel leak cannot be excluded. 6/30>> echo: EF 60-65%.  Significant microbiology data: 6/22>> urine culture: No growth 6/22>> blood culture: No growth 6/30>> pelvic abscess culture: Gram-negative rods/gram-positive cocci  Procedures: 6/22>> sigmoidectomy with end colostomy. 6/30>> CT-guided drain and pelvic abscess by IR  Consults: General Surgery IR Urology  Subjective: Solid brown-colored stool in ostomy-no complaints.  Tolerating full liquids.  Abdomen still somewhat mildly distended.  Objective: Vitals: Blood pressure 134/77, pulse 76, temperature (!) 97.2 F (36.2 C), temperature source Oral, resp. rate 18, height 5' 4 (1.626 m), weight 61.8 kg, SpO2 98%.   Exam: Awake/alert Chest: Clear to auscultation CVS:  S1-S2 regular Abdomen: Soft-mildly distended-appropriately tender-brown stool in ostomy Nonfocal exam ++ Edema in B/L legs.  Pertinent Labs/Radiology:    Latest Ref Rng & Units 08/10/2023    5:54 AM 08/09/2023    5:46 AM 08/08/2023    8:31 AM  CBC  WBC 4.0 - 10.5 K/uL 7.7  9.9  11.1   Hemoglobin 12.0 - 15.0 g/dL 8.7  8.9  9.1   Hematocrit 36.0 - 46.0 % 26.6  27.3  27.5   Platelets 150 - 400 K/uL 701  588  597     Lab Results  Component Value Date   NA 134 (L) 08/10/2023   K 3.5 08/10/2023   CL 95 (L) 08/10/2023   CO2 26 08/10/2023     Assessment/Plan: Sepsis secondary to stercoral colitis with ischemia/perforation of sigmoid colon with feculent peritonitis-s/p sigmoid colectomy/ostomy on 6/22-complicated by postoperative ileus and then residual pelvic abscess requiring CT-guided drain placement by on 6/30. Slow improvement Tolerating full liquids On Zosyn  CCA/IR following-await further recommendations   Hydronephrosis secondary to distal left ureteral stone Urology following-with recommendations for supportive care for now-once her medical problems have stabilized-urology will arrange for lithotripsy versus stent placement. Continue to monitor renal function closely  Hospital delirium Secondary to acute illness/narcotics Completely awake/alert this morning Delirium precautions  Normocytic anemia Due to acute illness Follow CBC-if significant drop-will transfuse  Thrombocytosis Likely reactive Supportive care  Hypomagnesemia Replete/recheck.  HTN BP stable with Coreg Lisinopril  remains on hold Follow/optimize  Lower extremity edema Secondary to hypoalbuminemia/IV fluid resuscitation in the setting of acute illness Echo stable-UA negative for proteinuria Since stabilizing/improving-will attempt gentle diuresis with Lasix-IV Lasix 20 mg today.  Chronic idiopathic constipation Bowel function recovering-brown stools Once more stable-will need to be  placed on a  bowel regimen.  Odynophagia secondary to oral/aphthous ulcers Improved on Valtrex and Magic mouthwash Supportive care  Glaucoma Timolol  eyedrops  Debility/deconditioning PT OT eval-Home health recommended.  Code status:   Code Status: Full Code   DVT Prophylaxis: Place TED hose Start: 08/09/23 1000 enoxaparin  (LOVENOX ) injection 40 mg Start: 07/31/23 1000 SCDs Start: 07/30/23 1954 SCDs Start: 07/30/23 1953   Family Communication: Spouse at bedside   Disposition Plan: Status is: Inpatient Remains inpatient appropriate because: Severity of illness   Planned Discharge Destination:Home health   Diet: Diet Order             Diet full liquid Fluid consistency: Thin  Diet effective now                     Antimicrobial agents: Anti-infectives (From admission, onward)    Start     Dose/Rate Route Frequency Ordered Stop   08/06/23 1130  acyclovir (ZOVIRAX) 200 MG capsule 400 mg  Status:  Discontinued        400 mg Oral 3 times daily 08/06/23 1037 08/08/23 1424   08/06/23 1100  piperacillin -tazobactam (ZOSYN ) IVPB 3.375 g        3.375 g 12.5 mL/hr over 240 Minutes Intravenous Every 8 hours 08/06/23 1051     07/30/23 1800  piperacillin -tazobactam (ZOSYN ) IVPB 3.375 g        3.375 g 12.5 mL/hr over 240 Minutes Intravenous Every 8 hours 07/30/23 1332 08/04/23 2359   07/30/23 1400  piperacillin -tazobactam (ZOSYN ) IVPB 3.375 g  Status:  Discontinued        3.375 g 100 mL/hr over 30 Minutes Intravenous Every 8 hours 07/30/23 1329 07/30/23 1332   07/30/23 1015  piperacillin -tazobactam (ZOSYN ) IVPB 3.375 g        3.375 g 100 mL/hr over 30 Minutes Intravenous  Once 07/30/23 1013 07/30/23 1311        MEDICATIONS: Scheduled Meds:  acetaminophen   1,000 mg Oral Q6H   carvedilol  3.125 mg Oral BID WC   Chlorhexidine  Gluconate Cloth  6 each Topical Daily   enoxaparin  (LOVENOX ) injection  40 mg Subcutaneous Q24H   feeding supplement  1 Container Oral TID BM    latanoprost   1 drop Both Eyes QHS   magic mouthwash  5 mL Oral QID   methocarbamol   500 mg Oral TID   Or   methocarbamol  (ROBAXIN ) injection  500 mg Intravenous TID   pantoprazole  40 mg Oral Daily   sodium chloride  flush  3 mL Intravenous Q12H   timolol   1 drop Both Eyes BID   Continuous Infusions:  piperacillin -tazobactam (ZOSYN )  IV 3.375 g (08/10/23 0833)   PRN Meds:.albuterol , hydrALAZINE, HYDROmorphone  (DILAUDID ) injection, melatonin, metoprolol  tartrate, ondansetron  **OR** ondansetron  (ZOFRAN ) IV, mouth rinse, oxyCODONE    I have personally reviewed following labs and imaging studies  LABORATORY DATA: CBC: Recent Labs  Lab 08/06/23 0807 08/07/23 0610 08/08/23 0831 08/09/23 0546 08/10/23 0554  WBC 13.0* 11.9* 11.1* 9.9 7.7  HGB 9.4* 9.2* 9.1* 8.9* 8.7*  HCT 28.1* 28.0* 27.5* 27.3* 26.6*  MCV 85.4 87.0 87.3 87.2 87.5  PLT 511* 491* 597* 588* 701*    Basic Metabolic Panel: Recent Labs  Lab 08/06/23 0807 08/07/23 0610 08/08/23 0831 08/09/23 0546 08/10/23 0554  NA 136 136 138 136 134*  K 4.2 3.8 3.2* 4.2 3.5  CL 100 99 98 98 95*  CO2 21* 24 24 24 26   GLUCOSE 91 72 72 88 84  BUN 13  14 15 13 16   CREATININE 0.54 0.88 0.70 0.60 0.56  CALCIUM  8.1* 8.0* 7.6* 7.7* 7.8*  MG 1.7 1.8 1.9 2.0 1.6*  PHOS 3.0 3.6 3.0 2.8 3.2    GFR: Estimated Creatinine Clearance: 56.5 mL/min (by C-G formula based on SCr of 0.56 mg/dL).  Liver Function Tests: Recent Labs  Lab 08/06/23 0807 08/10/23 0554  AST 24 17  ALT 17 12  ALKPHOS 62 75  BILITOT 0.5 0.8  PROT 5.3* 5.0*  ALBUMIN  1.6* 1.6*   No results for input(s): LIPASE, AMYLASE in the last 168 hours. No results for input(s): AMMONIA in the last 168 hours.  Coagulation Profile: Recent Labs  Lab 08/07/23 0909  INR 1.2    Cardiac Enzymes: No results for input(s): CKTOTAL, CKMB, CKMBINDEX, TROPONINI in the last 168 hours.  BNP (last 3 results) No results for input(s): PROBNP in the last 8760  hours.  Lipid Profile: No results for input(s): CHOL, HDL, LDLCALC, TRIG, CHOLHDL, LDLDIRECT in the last 72 hours.  Thyroid  Function Tests: No results for input(s): TSH, T4TOTAL, FREET4, T3FREE, THYROIDAB in the last 72 hours.  Anemia Panel: No results for input(s): VITAMINB12, FOLATE, FERRITIN, TIBC, IRON, RETICCTPCT in the last 72 hours.  Urine analysis:    Component Value Date/Time   COLORURINE STRAW (A) 08/06/2023 1123   APPEARANCEUR CLEAR 08/06/2023 1123   LABSPEC 1.009 08/06/2023 1123   PHURINE 6.0 08/06/2023 1123   GLUCOSEU NEGATIVE 08/06/2023 1123   HGBUR NEGATIVE 08/06/2023 1123   BILIRUBINUR NEGATIVE 08/06/2023 1123   KETONESUR 5 (A) 08/06/2023 1123   PROTEINUR NEGATIVE 08/06/2023 1123   NITRITE NEGATIVE 08/06/2023 1123   LEUKOCYTESUR NEGATIVE 08/06/2023 1123    Sepsis Labs: Lactic Acid, Venous    Component Value Date/Time   LATICACIDVEN 1.8 07/30/2023 1213    MICROBIOLOGY: Recent Results (from the past 240 hours)  Aerobic/Anaerobic Culture w Gram Stain (surgical/deep wound)     Status: None   Collection Time: 08/07/23  1:41 PM   Specimen: Abscess  Result Value Ref Range Status   Specimen Description ABSCESS  Final   Special Requests NONE  Final   Gram Stain   Final    ABUNDANT WBC PRESENT, PREDOMINANTLY PMN ABUNDANT GRAM NEGATIVE RODS ABUNDANT GRAM POSITIVE COCCI Performed at [facility], [address], [place], [place] [address]    Culture   Final    ABUNDANT MULTIPLE ORGANISMS PRESENT, NONE PREDOMINANT MIXED ANAEROBIC FLORA PRESENT.  CALL LAB IF FURTHER IID REQUIRED.    Report Status 08/09/2023 FINAL  Final    RADIOLOGY STUDIES/RESULTS: No results found.    LOS: 11 days   Donalda Applebaum, MD  [facility]    To contact the attending provider between 7A-7P or the covering provider during after hours 7P-7A, please log into the web site [email or web address] and access using universal [place]  password for that web site. If you do not have the password, please call the hospital operator.  08/10/2023, 11:46 AM

## 2023-08-10 NOTE — Progress Notes (Signed)
 Physical Therapy Treatment Patient Details Name: Angela [name] MRN: [id number] DOB: Nov 21, 1950 Today's Date: 08/10/2023   History of Present Illness Angela [name] is a 73 y.o. female who presents with abdominal pain and bloating  Renal CT revealed interval development of intraperitoneal free air and moderate volume free fluid containing layering hyperdensities consistent with bowel perforation, distal left ureteral stone measuring 9 mm with interval decrease left hydroureteronephrosis. . Past medical history significant of hypertension, hyperlipidemia, glaucoma and prior tobacco abuse    PT Comments  Pt tolerated treatment well today. Pt received ambulating in hallway with nephew. Pt able to ambulate around unit with RW at supervision level. No change in DC/DME recs at this time. PT will continue to follow.     If plan is discharge home, recommend the following: A little help with walking and/or transfers;A little help with bathing/dressing/bathroom;Assistance with cooking/housework;Direct supervision/assist for medications management;Assist for transportation;Help with stairs or ramp for entrance   Can travel by private vehicle        Equipment Recommendations  Rolling walker (2 wheels)    Recommendations for Other Services       Precautions / Restrictions Precautions Precautions: Fall;Other (comment) Recall of Precautions/Restrictions: Intact Precaution/Restrictions Comments: New ostomy bag and JP drain Restrictions Weight Bearing Restrictions Per Provider Order: No     Mobility  Bed Mobility               General bed mobility comments: Pt received in hallway with family member.    Transfers                   General transfer comment: Pt received in hallway with family member.    Ambulation/Gait Ambulation/Gait assistance: Supervision Gait Distance (Feet): 300 Feet Assistive device: Rolling walker (2 wheels) Gait Pattern/deviations: Decreased stride  length, Step-through pattern Gait velocity: decreased     General Gait Details: Slowed step through pattern. no LOB noted.   Stairs             Wheelchair Mobility     Tilt Bed    Modified Rankin (Stroke Patients Only)       Balance Overall balance assessment: Needs assistance Sitting-balance support: Bilateral upper extremity supported, Feet supported Sitting balance-Leahy Scale: Good     Standing balance support: Bilateral upper extremity supported, During functional activity Standing balance-Leahy Scale: Fair                              Hotel manager: No apparent difficulties  Cognition Arousal: Alert Behavior During Therapy: WFL for tasks assessed/performed                             Following commands: Intact      Cueing Cueing Techniques: Verbal cues  Exercises      General Comments General comments (skin integrity, edema, etc.): VSS      Pertinent Vitals/Pain Pain Assessment Pain Assessment: Faces Faces Pain Scale: Hurts a little bit Pain Location: abdomen Pain Descriptors / Indicators: Discomfort Pain Intervention(s): Monitored during session    Home Living                          Prior Function            PT Goals (current goals can now be found in the care plan section) Progress towards PT goals:  Progressing toward goals    Frequency    Min 3X/week      PT Plan      Co-evaluation              AM-PAC PT 6 Clicks Mobility   Outcome Measure  Help needed turning from your back to your side while in a flat bed without using bedrails?: A Little Help needed moving from lying on your back to sitting on the side of a flat bed without using bedrails?: A Little Help needed moving to and from a bed to a chair (including a wheelchair)?: A Little Help needed standing up from a chair using your arms (e.g., wheelchair or bedside chair)?: A Little Help needed to  walk in hospital room?: A Little Help needed climbing 3-5 steps with a railing? : A Little 6 Click Score: 18    End of Session Equipment Utilized During Treatment: Gait belt Activity Tolerance: Patient tolerated treatment well Patient left: with call bell/phone within reach;with family/visitor present;in bed;with nursing/sitter in room Nurse Communication: Mobility status PT Visit Diagnosis: Other abnormalities of gait and mobility (R26.89)     Time: 8781-8773 PT Time Calculation (min) (ACUTE ONLY): 8 min  Charges:    $Gait Training: 8-22 mins PT General Charges $$ ACUTE PT VISIT: 1 Visit                     Angela [name], PT, DPT Acute Rehab Services [phone]    Angela [name] 08/10/2023, 4:45 PM

## 2023-08-10 NOTE — Plan of Care (Signed)

## 2023-08-11 ENCOUNTER — Inpatient Hospital Stay (HOSPITAL_COMMUNITY)

## 2023-08-11 DIAGNOSIS — I10 Essential (primary) hypertension: Secondary | ICD-10-CM | POA: Diagnosis not present

## 2023-08-11 DIAGNOSIS — N1330 Unspecified hydronephrosis: Secondary | ICD-10-CM | POA: Diagnosis not present

## 2023-08-11 DIAGNOSIS — K631 Perforation of intestine (nontraumatic): Secondary | ICD-10-CM | POA: Diagnosis not present

## 2023-08-11 DIAGNOSIS — N201 Calculus of ureter: Secondary | ICD-10-CM | POA: Diagnosis not present

## 2023-08-11 LAB — MAGNESIUM: Magnesium: 1.9 mg/dL (ref 1.7–2.4)

## 2023-08-11 LAB — COMPREHENSIVE METABOLIC PANEL WITH GFR
ALT: 11 U/L (ref 0–44)
AST: 13 U/L — ABNORMAL LOW (ref 15–41)
Albumin: 1.7 g/dL — ABNORMAL LOW (ref 3.5–5.0)
Alkaline Phosphatase: 75 U/L (ref 38–126)
Anion gap: 14 (ref 5–15)
BUN: 17 mg/dL (ref 8–23)
CO2: 24 mmol/L (ref 22–32)
Calcium: 7.8 mg/dL — ABNORMAL LOW (ref 8.9–10.3)
Chloride: 97 mmol/L — ABNORMAL LOW (ref 98–111)
Creatinine, Ser: 0.59 mg/dL (ref 0.44–1.00)
GFR, Estimated: >60 mL/min (ref >60)
Glucose, Bld: 86 mg/dL (ref 70–99)
Potassium: 3.4 mmol/L — ABNORMAL LOW (ref 3.5–5.1)
Sodium: 135 mmol/L (ref 135–145)
Total Bilirubin: 1.2 mg/dL (ref 0.0–1.2)
Total Protein: 5.2 g/dL — ABNORMAL LOW (ref 6.5–8.1)

## 2023-08-11 MED ORDER — PHENOL 1.4 % MT LIQD
1.0000 | OROMUCOSAL | Status: DC | PRN
  Administered 2023-08-11 (×2): 1 via OROMUCOSAL
  Filled 2023-08-11: qty 177

## 2023-08-11 MED ORDER — POTASSIUM CHLORIDE CRYS ER 20 MEQ PO TBCR
40.0000 meq | EXTENDED_RELEASE_TABLET | Freq: Once | ORAL | Status: AC
  Administered 2023-08-11: 40 meq via ORAL
  Filled 2023-08-11: qty 2

## 2023-08-11 MED ORDER — POTASSIUM CHLORIDE 10 MEQ/100ML IV SOLN
10.0000 meq | INTRAVENOUS | Status: AC
  Administered 2023-08-11 (×2): 10 meq via INTRAVENOUS
  Filled 2023-08-11: qty 100

## 2023-08-11 MED ORDER — METOPROLOL TARTRATE 5 MG/5ML IV SOLN: 5.0000 mg | Freq: Four times a day (QID) | INTRAVENOUS | Status: DC | PRN

## 2023-08-11 MED ORDER — SODIUM CHLORIDE 0.9 % IV SOLN: INTRAVENOUS | Status: DC

## 2023-08-11 NOTE — Progress Notes (Signed)
 Pt tooks med but then vomited after potassium. Dr. Raenelle notified

## 2023-08-11 NOTE — Progress Notes (Signed)
 PROGRESS NOTE        PATIENT DETAILS Name: Angela [name] Age: 73 y.o. Sex: female Date of Birth: 1950-08-29 Admit Date: 07/30/2023 Admitting Physician Maximino DELENA Sharps, MD ERE:[id number], [name], DO  Brief Summary: Patient is a 73 y.o.  female with history of HTN, HLD who presented with worsening abdominal pain-CT imaging showed intraperitoneal free air consistent with bowel perforation  Significant events: 6/21>> evaluated in the ED for abdominal pain-found to have left mid ureteral stone-9 mm with hydronephrosis-and large volume of stool-managed with supportive care and discharged home. 6/22>> worsening abdominal pain-repeat CT scan consistent with bowel perforation-surgery consult-admit to [facility].  Significant studies: 6/22>> CT renal stone study: Interval development of intraperitoneal free air-moderate volume free fluid-consistent with bowel perforation, persistent large volume stool with dilated sigmoid colon.  Similar location of left ureteral stone-9 mm. 6/29>> CT abdomen/pelvis: S/p partial colectomy-LLQ ostomy-complex free fluid with multiple foci of air-most pronounced in the surgical bed-possibility of superimposed infection or bowel leak cannot be excluded. 6/30>> echo: EF 60-65%.  Significant microbiology data: 6/22>> urine culture: No growth 6/22>> blood culture: No growth 6/30>> pelvic abscess culture: Gram-negative rods/gram-positive cocci-mixed anaerobic flora.  Procedures: 6/22>> sigmoidectomy with end colostomy. 6/30>> CT-guided drain and pelvic abscess by IR  Consults: General Surgery IR Urology  Subjective: Still with some mild distention of her abdomen-was nauseous earlier this morning but was informed later by RN that the patient actually had a small amount of vomitus.  Objective: Vitals: Blood pressure (!) 104/92, pulse 80, temperature (!) 97.2 F (36.2 C), temperature source Oral, resp. rate 18, height 5' 4 (1.626 m), weight  61.8 kg, SpO2 98%.   Exam: Awake/alert Abdomen: Soft-slightly distended-appropriately tender-brown stool in ostomy bag Extremities:+ Edema  Pertinent Labs/Radiology:    Latest Ref Rng & Units 08/10/2023    5:54 AM 08/09/2023    5:46 AM 08/08/2023    8:31 AM  CBC  WBC 4.0 - 10.5 K/uL 7.7  9.9  11.1   Hemoglobin 12.0 - 15.0 g/dL 8.7  8.9  9.1   Hematocrit 36.0 - 46.0 % 26.6  27.3  27.5   Platelets 150 - 400 K/uL 701  588  597     Lab Results  Component Value Date   NA 135 08/11/2023   K 3.4 (L) 08/11/2023   CL 97 (L) 08/11/2023   CO2 24 08/11/2023     Assessment/Plan: Sepsis secondary to stercoral colitis with ischemia/perforation of sigmoid colon with feculent peritonitis-s/p sigmoid colectomy/ostomy on 6/22-complicated by postoperative ileus and then residual pelvic abscess requiring CT-guided drain placement by on 6/30. Slow improvement-appears to have developed some amount of ileus-has abdomen is distended but she also has brown stools in ostomy bag-vomited earlier this morning. Was downgraded to clear liquids on 7/3 by general surgery Remains on Zosyn  Await further recommendations from general surgery service.  Hydronephrosis secondary to distal left ureteral stone Urology following-with recommendations for supportive care for now-once her medical problems have stabilized-urology will arrange for lithotripsy versus stent placement. Continue to monitor renal function closely  Hospital delirium Secondary to acute illness/narcotics Completely awake/alert this morning Delirium precautions  Normocytic anemia Due to acute illness Follow CBC-if significant drop-will transfuse  Thrombocytosis Likely reactive Supportive care  Hypomagnesemia Repleted  Hypokalemia Replete/recheck  HTN BP stable with Coreg  Lisinopril  remains on hold Follow/optimize  Lower extremity edema Secondary to  hypoalbuminemia/IV fluid resuscitation in the setting of acute illness Echo stable-UA  negative for proteinuria Hold off on further furosemide  today given concerns for vomiting/ileus.  Chronic idiopathic constipation Bowel function recovering-brown stools Once more stable-will need to be placed on a bowel regimen.  Odynophagia secondary to oral/aphthous ulcers Improved on Valtrex and Magic mouthwash Supportive care  Glaucoma Timolol  eyedrops  Debility/deconditioning PT OT eval-Home health recommended.  Code status:   Code Status: Full Code   DVT Prophylaxis: Place TED hose Start: 08/09/23 1000 enoxaparin  (LOVENOX ) injection 40 mg Start: 07/31/23 1000 SCDs Start: 07/30/23 1954 SCDs Start: 07/30/23 1953   Family Communication: Sister-in-law at bedside   Disposition Plan: Status is: Inpatient Remains inpatient appropriate because: Severity of illness   Planned Discharge Destination:Home health   Diet: Diet Order             Diet clear liquid Fluid consistency: Thin  Diet effective now                     Antimicrobial agents: Anti-infectives (From admission, onward)    Start     Dose/Rate Route Frequency Ordered Stop   08/06/23 1130  acyclovir  (ZOVIRAX ) 200 MG capsule 400 mg  Status:  Discontinued        400 mg Oral 3 times daily 08/06/23 1037 08/08/23 1424   08/06/23 1100  piperacillin -tazobactam (ZOSYN ) IVPB 3.375 g        3.375 g 12.5 mL/hr over 240 Minutes Intravenous Every 8 hours 08/06/23 1051     07/30/23 1800  piperacillin -tazobactam (ZOSYN ) IVPB 3.375 g        3.375 g 12.5 mL/hr over 240 Minutes Intravenous Every 8 hours 07/30/23 1332 08/04/23 2359   07/30/23 1400  piperacillin -tazobactam (ZOSYN ) IVPB 3.375 g  Status:  Discontinued        3.375 g 100 mL/hr over 30 Minutes Intravenous Every 8 hours 07/30/23 1329 07/30/23 1332   07/30/23 1015  piperacillin -tazobactam (ZOSYN ) IVPB 3.375 g        3.375 g 100 mL/hr over 30 Minutes Intravenous  Once 07/30/23 1013 07/30/23 1311        MEDICATIONS: Scheduled Meds:  acetaminophen    1,000 mg Oral Q6H   carvedilol   3.125 mg Oral BID WC   Chlorhexidine  Gluconate Cloth  6 each Topical Daily   enoxaparin  (LOVENOX ) injection  40 mg Subcutaneous Q24H   feeding supplement  1 Container Oral TID BM   latanoprost   1 drop Both Eyes QHS   magic mouthwash  5 mL Oral QID   methocarbamol   500 mg Oral TID   Or   methocarbamol  (ROBAXIN ) injection  500 mg Intravenous TID   pantoprazole   40 mg Oral Daily   sodium chloride  flush  3 mL Intravenous Q12H   timolol   1 drop Both Eyes BID   Continuous Infusions:  piperacillin -tazobactam (ZOSYN )  IV 3.375 g (08/11/23 0842)   potassium chloride      PRN Meds:.albuterol , hydrALAZINE , HYDROmorphone  (DILAUDID ) injection, melatonin, metoprolol  tartrate, ondansetron  **OR** ondansetron  (ZOFRAN ) IV, mouth rinse, oxyCODONE    I have personally reviewed following labs and imaging studies  LABORATORY DATA: CBC: Recent Labs  Lab 08/06/23 0807 08/07/23 0610 08/08/23 0831 08/09/23 0546 08/10/23 0554  WBC 13.0* 11.9* 11.1* 9.9 7.7  HGB 9.4* 9.2* 9.1* 8.9* 8.7*  HCT 28.1* 28.0* 27.5* 27.3* 26.6*  MCV 85.4 87.0 87.3 87.2 87.5  PLT 511* 491* 597* 588* 701*    Basic Metabolic Panel: Recent Labs  Lab 08/06/23 0807 08/07/23 0610  08/08/23 0831 08/09/23 0546 08/10/23 0554 08/11/23 0505  NA 136 136 138 136 134* 135  K 4.2 3.8 3.2* 4.2 3.5 3.4*  CL 100 99 98 98 95* 97*  CO2 21* 24 24 24 26 24   GLUCOSE 91 72 72 88 84 86  BUN 13 14 15 13 16 17   CREATININE 0.54 0.88 0.70 0.60 0.56 0.59  CALCIUM  8.1* 8.0* 7.6* 7.7* 7.8* 7.8*  MG 1.7 1.8 1.9 2.0 1.6* 1.9  PHOS 3.0 3.6 3.0 2.8 3.2  --     GFR: Estimated Creatinine Clearance: 56.5 mL/min (by C-G formula based on SCr of 0.59 mg/dL).  Liver Function Tests: Recent Labs  Lab 08/06/23 0807 08/10/23 0554 08/11/23 0505  AST 24 17 13*  ALT 17 12 11   ALKPHOS 62 75 75  BILITOT 0.5 0.8 1.2  PROT 5.3* 5.0* 5.2*  ALBUMIN  1.6* 1.6* 1.7*   No results for input(s): LIPASE, AMYLASE in the  last 168 hours. No results for input(s): AMMONIA in the last 168 hours.  Coagulation Profile: Recent Labs  Lab 08/07/23 0909  INR 1.2    Cardiac Enzymes: No results for input(s): CKTOTAL, CKMB, CKMBINDEX, TROPONINI in the last 168 hours.  BNP (last 3 results) No results for input(s): PROBNP in the last 8760 hours.  Lipid Profile: No results for input(s): CHOL, HDL, LDLCALC, TRIG, CHOLHDL, LDLDIRECT in the last 72 hours.  Thyroid  Function Tests: No results for input(s): TSH, T4TOTAL, FREET4, T3FREE, THYROIDAB in the last 72 hours.  Anemia Panel: No results for input(s): VITAMINB12, FOLATE, FERRITIN, TIBC, IRON, RETICCTPCT in the last 72 hours.  Urine analysis:    Component Value Date/Time   COLORURINE STRAW (A) 08/06/2023 1123   APPEARANCEUR CLEAR 08/06/2023 1123   LABSPEC 1.009 08/06/2023 1123   PHURINE 6.0 08/06/2023 1123   GLUCOSEU NEGATIVE 08/06/2023 1123   HGBUR NEGATIVE 08/06/2023 1123   BILIRUBINUR NEGATIVE 08/06/2023 1123   KETONESUR 5 (A) 08/06/2023 1123   PROTEINUR NEGATIVE 08/06/2023 1123   NITRITE NEGATIVE 08/06/2023 1123   LEUKOCYTESUR NEGATIVE 08/06/2023 1123    Sepsis Labs: Lactic Acid, Venous    Component Value Date/Time   LATICACIDVEN 1.8 07/30/2023 1213    MICROBIOLOGY: Recent Results (from the past 240 hours)  Aerobic/Anaerobic Culture w Gram Stain (surgical/deep wound)     Status: None   Collection Time: 08/07/23  1:41 PM   Specimen: Abscess  Result Value Ref Range Status   Specimen Description ABSCESS  Final   Special Requests NONE  Final   Gram Stain   Final    ABUNDANT WBC PRESENT, PREDOMINANTLY PMN ABUNDANT GRAM NEGATIVE RODS ABUNDANT GRAM POSITIVE COCCI Performed at [facility], [address], [place], [place] [address]    Culture   Final    ABUNDANT MULTIPLE ORGANISMS PRESENT, NONE PREDOMINANT MIXED ANAEROBIC FLORA PRESENT.  CALL LAB IF FURTHER IID REQUIRED.    Report  Status 08/09/2023 FINAL  Final    RADIOLOGY STUDIES/RESULTS: No results found.    LOS: 12 days   Donalda Applebaum, MD  [facility]    To contact the attending provider between 7A-7P or the covering provider during after hours 7P-7A, please log into the web site [email or web address] and access using universal [place] password for that web site. If you do not have the password, please call the hospital operator.  08/11/2023, 11:08 AM

## 2023-08-11 NOTE — Plan of Care (Signed)
  Problem: Education: Goal: Knowledge of General Education information will improve Description: Including pain rating scale, medication(s)/side effects and non-pharmacologic comfort measures Outcome: Progressing   Problem: Health Behavior/Discharge Planning: Goal: Ability to manage health-related needs will improve Outcome: Progressing   Problem: Clinical Measurements: Goal: Ability to maintain clinical measurements within normal limits will improve Outcome: Progressing Goal: Will remain free from infection Outcome: Progressing Goal: Diagnostic test results will improve Outcome: Progressing Goal: Respiratory complications will improve Outcome: Progressing Goal: Cardiovascular complication will be avoided Outcome: Progressing   Problem: Nutrition: Goal: Adequate nutrition will be maintained Outcome: Progressing   Problem: Activity: Goal: Risk for activity intolerance will decrease Outcome: Progressing   Problem: Coping: Goal: Level of anxiety will decrease Outcome: Progressing   Problem: Elimination: Goal: Will not experience complications related to bowel motility Outcome: Progressing Goal: Will not experience complications related to urinary retention Outcome: Progressing   Problem: Safety: Goal: Ability to remain free from injury will improve Outcome: Progressing   Problem: Skin Integrity: Goal: Risk for impaired skin integrity will decrease Outcome: Progressing   Problem: Safety: Goal: Non-violent Restraint(s) Outcome: Progressing

## 2023-08-11 NOTE — Progress Notes (Signed)
 12 Days Post-Op   Subjective/Chief Complaint: NAUSEATED WITH K- tolerating liquids of  Ostomy working    Objective: Vital signs in last 24 hours: Temp:  [97.2 F (36.2 C)-98.5 F (36.9 C)] 97.2 F (36.2 C) (07/04 0847) Pulse Rate:  [80] 80 (07/04 0847) Resp:  [18] 18 (07/04 0847) BP: (104-131)/(72-92) 104/92 (07/04 0847) SpO2:  [98 %] 98 % (07/04 0847) Last BM Date : 08/11/23  Intake/Output from previous day: No intake/output data recorded. Intake/Output this shift: No intake/output data recorded.  Gen: NAD Lungs: effort nonlabored  Abd: soft, colostomy viable and with good output, solid stool.  Distended.  Midline wound fascia intact and no further drainage .  IR drain with minimal more thin, but still cloudy/slightly tan,  Lab Results:  Recent Labs    08/09/23 0546 08/10/23 0554  WBC 9.9 7.7  HGB 8.9* 8.7*  HCT 27.3* 26.6*  PLT 588* 701*   BMET Recent Labs    08/10/23 0554 08/11/23 0505  NA 134* 135  K 3.5 3.4*  CL 95* 97*  CO2 26 24  GLUCOSE 84 86  BUN 16 17  CREATININE 0.56 0.59  CALCIUM  7.8* 7.8*   PT/INR No results for input(s): LABPROT, INR in the last 72 hours. ABG No results for input(s): PHART, HCO3 in the last 72 hours.  Invalid input(s): PCO2, PO2  Studies/Results: No results found.  Anti-infectives: Anti-infectives (From admission, onward)    Start     Dose/Rate Route Frequency Ordered Stop   08/06/23 1130  acyclovir  (ZOVIRAX ) 200 MG capsule 400 mg  Status:  Discontinued        400 mg Oral 3 times daily 08/06/23 1037 08/08/23 1424   08/06/23 1100  piperacillin -tazobactam (ZOSYN ) IVPB 3.375 g        3.375 g 12.5 mL/hr over 240 Minutes Intravenous Every 8 hours 08/06/23 1051     07/30/23 1800  piperacillin -tazobactam (ZOSYN ) IVPB 3.375 g        3.375 g 12.5 mL/hr over 240 Minutes Intravenous Every 8 hours 07/30/23 1332 08/04/23 2359   07/30/23 1400  piperacillin -tazobactam (ZOSYN ) IVPB 3.375 g  Status:  Discontinued         3.375 g 100 mL/hr over 30 Minutes Intravenous Every 8 hours 07/30/23 1329 07/30/23 1332   07/30/23 1015  piperacillin -tazobactam (ZOSYN ) IVPB 3.375 g        3.375 g 100 mL/hr over 30 Minutes Intravenous  Once 07/30/23 1013 07/30/23 1311       Assessment/Plan: s/p Procedure(s): LAPAROTOMY, EXPLORATORY (N/A) COLECTOMY, SIGMOID, OPEN POD 12, s/p ex lap with Hartmann's procedure for stercoral colitis with ischemia and perforation with feculent peritonitis, Dr. Polly 6/22 -CLD from FLD due to abdominal distention  Avoid po K for N/V  Check  KUB - suspect ileus given ostomy output  NGT if she vomits  -mobilize -continue abx due to pelvic abscess, WBC normal though -BID dressing changes to midline wound  -WOC following -pulm toilet  -IR drain in place for this pelvic abscess.  Will continue to monitor.  This could be a rectal stump break down, but treatment is control with a drain, so continue this for now and monitor output.  D/w patient and her nephew at bedside today.    FEN - CLD/Breeze VTE - lovenox  ID - zosyn     Nephrolithiasis - per urology, no plans for today Chronic constipation - bowel regimen HTN GLaucoma  LOS: 12 days    Debby DELENA Shipper MD  08/11/2023

## 2023-08-11 NOTE — Progress Notes (Signed)
 OT Cancellation Note  Patient Details Name: Angela [name] MRN: [id number] DOB: 29-Aug-1950   Cancelled Treatment:    Reason Eval/Treat Not Completed: Patient declined, no reason specified (Pt declined, has been too nauseous and recent GI issues that have been challenging for her lately. She did not have enough energy for therapy. OT will follow-up with pt as able.)  08/11/2023  AB, OTR/L  Acute Rehabilitation Services  Office: [phone]   Angela [name] 08/11/2023, 4:47 PM

## 2023-08-11 NOTE — Progress Notes (Signed)
Pt vomited again

## 2023-08-12 ENCOUNTER — Inpatient Hospital Stay (HOSPITAL_COMMUNITY)

## 2023-08-12 ENCOUNTER — Other Ambulatory Visit: Payer: Self-pay

## 2023-08-12 DIAGNOSIS — K631 Perforation of intestine (nontraumatic): Secondary | ICD-10-CM | POA: Diagnosis not present

## 2023-08-12 DIAGNOSIS — I10 Essential (primary) hypertension: Secondary | ICD-10-CM | POA: Diagnosis not present

## 2023-08-12 DIAGNOSIS — N1330 Unspecified hydronephrosis: Secondary | ICD-10-CM | POA: Diagnosis not present

## 2023-08-12 DIAGNOSIS — N201 Calculus of ureter: Secondary | ICD-10-CM | POA: Diagnosis not present

## 2023-08-12 LAB — BASIC METABOLIC PANEL WITH GFR
Anion gap: 11 (ref 5–15)
BUN: 15 mg/dL (ref 8–23)
CO2: 30 mmol/L (ref 22–32)
Calcium: 8 mg/dL — ABNORMAL LOW (ref 8.9–10.3)
Chloride: 95 mmol/L — ABNORMAL LOW (ref 98–111)
Creatinine, Ser: 0.81 mg/dL (ref 0.44–1.00)
GFR, Estimated: >60 mL/min (ref >60)
Glucose, Bld: 74 mg/dL (ref 70–99)
Potassium: 4.1 mmol/L (ref 3.5–5.1)
Sodium: 136 mmol/L (ref 135–145)

## 2023-08-12 LAB — CBC
HCT: 27.1 % — ABNORMAL LOW (ref 36.0–46.0)
Hemoglobin: 9 g/dL — ABNORMAL LOW (ref 12.0–15.0)
MCH: 29.2 pg (ref 26.0–34.0)
MCHC: 33.2 g/dL (ref 30.0–36.0)
MCV: 88 fL (ref 80.0–100.0)
Platelets: 787 K/uL — ABNORMAL HIGH (ref 150–400)
RBC: 3.08 MIL/uL — ABNORMAL LOW (ref 3.87–5.11)
RDW: 15.4 % (ref 11.5–15.5)
WBC: 6.7 K/uL (ref 4.0–10.5)
nRBC: 0 % (ref 0.0–0.2)

## 2023-08-12 LAB — PHOSPHORUS: Phosphorus: 3 mg/dL (ref 2.5–4.6)

## 2023-08-12 LAB — GLUCOSE, CAPILLARY
Glucose-Capillary: 90 mg/dL (ref 70–99)
Glucose-Capillary: 104 mg/dL — ABNORMAL HIGH (ref 70–99)

## 2023-08-12 LAB — MAGNESIUM: Magnesium: 1.7 mg/dL (ref 1.7–2.4)

## 2023-08-12 MED ORDER — INSULIN ASPART 100 UNIT/ML IJ SOLN: 0.0000 [IU] | Freq: Four times a day (QID) | INTRAMUSCULAR | Status: DC

## 2023-08-12 MED ORDER — DIPHENHYDRAMINE HCL 50 MG/ML IJ SOLN
12.5000 mg | Freq: Once | INTRAMUSCULAR | Status: AC | PRN
  Administered 2023-08-12: 12.5 mg via INTRAVENOUS
  Filled 2023-08-12: qty 1

## 2023-08-12 MED ORDER — FAT EMUL FISH OIL/PLANT BASED 20% (SMOFLIPID)IV EMUL
250.0000 mL | INTRAVENOUS | Status: AC
  Administered 2023-08-12: 250 mL via INTRAVENOUS
  Filled 2023-08-12: qty 250

## 2023-08-12 MED ORDER — SODIUM CHLORIDE 0.9% FLUSH
10.0000 mL | INTRAVENOUS | Status: DC | PRN
  Administered 2023-08-24: 10 mL
  Administered 2023-08-30: 20 mL
  Administered 2023-09-01: 10 mL

## 2023-08-12 MED ORDER — THIAMINE HCL 100 MG/ML IJ SOLN
100.0000 mg | Freq: Once | INTRAMUSCULAR | Status: AC
  Administered 2023-08-12: 100 mg via INTRAVENOUS
  Filled 2023-08-12: qty 2

## 2023-08-12 MED ORDER — THIAMINE HCL 100 MG/ML IJ SOLN
100.0000 mg | INTRAMUSCULAR | Status: AC
  Administered 2023-08-13 – 2023-08-17 (×5): 100 mg via INTRAVENOUS
  Filled 2023-08-12 (×5): qty 2

## 2023-08-12 MED ORDER — MAGNESIUM SULFATE 2 GM/50ML IV SOLN
2.0000 g | Freq: Once | INTRAVENOUS | Status: AC
  Administered 2023-08-12: 2 g via INTRAVENOUS
  Filled 2023-08-12: qty 50

## 2023-08-12 MED ORDER — TRACE MINERALS CU-MN-SE-ZN 300-55-60-3000 MCG/ML IV SOLN
INTRAVENOUS | Status: AC
  Filled 2023-08-12 (×2): qty 1000

## 2023-08-12 MED ORDER — IOHEXOL 350 MG/ML SOLN
75.0000 mL | Freq: Once | INTRAVENOUS | Status: AC | PRN
  Administered 2023-08-12: 75 mL via INTRAVENOUS

## 2023-08-12 NOTE — Progress Notes (Signed)
 13 Days Post-Op   Subjective/Chief Complaint: Patient with nausea vomiting distention.  NG tube replaced yesterday.  Feels better.  She did not tolerate her diet   Objective: Vital signs in last 24 hours: Temp:  [98 F (36.7 C)-98.1 F (36.7 C)] 98 F (36.7 C) (07/05 0758) Pulse Rate:  [77-92] 89 (07/05 0758) Resp:  [14-21] 16 (07/05 0758) BP: (104-138)/(63-72) 118/64 (07/05 0758) SpO2:  [93 %-97 %] 97 % (07/05 0758) Last BM Date : 08/11/23  Intake/Output from previous day: 07/04 0701 - 07/05 0700 In: 629.3 [IV Piggyback:629.3] Out: 2255 [Emesis/NG output:2250; Drains:5] Intake/Output this shift: Total I/O In: -  Out: 100 [Urine:100]    Lab Results:  Gen: NAD Lungs: effort nonlabored  Abd: soft, colostomy viable and with good output, liquid stool.  Distended.  Midline wound fascia intact and no further drainage .  IR drain with minimal more thin, but still cloudy/slightly tan   BMET Recent Labs    08/11/23 0505 08/12/23 0401  NA 135 136  K 3.4* 4.1  CL 97* 95*  CO2 24 30  GLUCOSE 86 74  BUN 17 15  CREATININE 0.59 0.81  CALCIUM  7.8* 8.0*   PT/INR No results for input(s): LABPROT, INR in the last 72 hours. ABG No results for input(s): PHART, HCO3 in the last 72 hours.  Invalid input(s): PCO2, PO2  Studies/Results: US  EKG SITE RITE Result Date: 08/12/2023 If Site Rite image not attached, placement could not be confirmed due to current cardiac rhythm.  DG Abd 2 Views Result Date: 08/12/2023 CLINICAL DATA:  Follow-up ileus EXAM: ABDOMEN - 2 VIEW COMPARISON:  08/11/2023 FINDINGS: Enteric tube tip and side port are within the gastric fundus. Percutaneous drainage catheter enters from a right lower quadrant approach and terminates in the right hemipelvis. Persistent dilated loops of small bowel within the left hemiabdomen measure up to 4.3 cm, similar to previous exam. No signs of pneumoperitoneum. IMPRESSION: Persistent dilated loops of small bowel  within the left hemiabdomen, similar to previous exam. Electronically Signed   By: Waddell Calk M.D.   On: 08/12/2023 09:17   DG Abd 1 View Result Date: 08/11/2023 CLINICAL DATA:  NG tube placement EXAM: ABDOMEN - 1 VIEW COMPARISON:  08/11/2023 FINDINGS: NG tube tip is in the fundus of the stomach. Dilated small bowel loops. Gas within the colon, but the colon appears to be decompressed. No organomegaly or free air. IMPRESSION: NG tube tip in the stomach. Gaseous distension of multiple small bowel loops, similar to prior study. Electronically Signed   By: Franky Crease M.D.   On: 08/11/2023 18:41   DG Abd 1 View Result Date: 08/11/2023 CLINICAL DATA:  Follow-up ileus EXAM: ABDOMEN - 1 VIEW COMPARISON:  08/06/2023 radiograph and CT FINDINGS: Few air dilated loops of small bowel measuring up to 4 cm with scattered colon gas. Left lower quadrant ostomy. Drainage catheter in the right pelvis. Decreased stool in the colon. IMPRESSION: Few air dilated loops of small bowel with scattered colon gas, suggesting ileus, findings do not appear greatly changed compared to scout image from the CT 08/06/2023. Electronically Signed   By: Luke Bun M.D.   On: 08/11/2023 14:15    Anti-infectives: Anti-infectives (From admission, onward)    Start     Dose/Rate Route Frequency Ordered Stop   08/06/23 1130  acyclovir  (ZOVIRAX ) 200 MG capsule 400 mg  Status:  Discontinued        400 mg Oral 3 times daily 08/06/23 1037 08/08/23 1424  08/06/23 1100  piperacillin -tazobactam (ZOSYN ) IVPB 3.375 g        3.375 g 12.5 mL/hr over 240 Minutes Intravenous Every 8 hours 08/06/23 1051     07/30/23 1800  piperacillin -tazobactam (ZOSYN ) IVPB 3.375 g        3.375 g 12.5 mL/hr over 240 Minutes Intravenous Every 8 hours 07/30/23 1332 08/04/23 2359   07/30/23 1400  piperacillin -tazobactam (ZOSYN ) IVPB 3.375 g  Status:  Discontinued        3.375 g 100 mL/hr over 30 Minutes Intravenous Every 8 hours 07/30/23 1329 07/30/23 1332    07/30/23 1015  piperacillin -tazobactam (ZOSYN ) IVPB 3.375 g        3.375 g 100 mL/hr over 30 Minutes Intravenous  Once 07/30/23 1013 07/30/23 1311       Assessment/Plan: Procedure(s): LAPAROTOMY, EXPLORATORY (N/A) COLECTOMY, SIGMOID, OPEN s/p Procedure(s): LAPAROTOMY, EXPLORATORY (N/A) COLECTOMY, SIGMOID, OPEN POD 13, s/p ex lap with Hartmann's procedure for stercoral colitis with ischemia and perforation with feculent peritonitis, Dr. Polly 6/22 - N.p.o. status.  Recommend CT scanning to exclude abscess as cause of ileus.  Recommend TNA therefore have ordered PICC line and can start TNA due to feeding intolerance    -mobilizing well in hallway.  Discussed care with patient and nephew at bedside. -continue abx due to pelvic abscess, WBC normal though -BID dressing changes to midline wound  -WOC following -pulm toilet  -  FEN -n.p.o./TNA VTE - lovenox  ID - zosyn     Nephrolithiasis - per urology, no plans for today Chronic constipation - bowel regimen HTN GLaucoma  LOS: 13 days    Debby DELENA Shipper MD  08/12/2023

## 2023-08-12 NOTE — Progress Notes (Signed)
 PROGRESS NOTE        PATIENT DETAILS Name: Angela [name] Age: 73 y.o. Sex: female Date of Birth: 09/29/1950 Admit Date: 07/30/2023 Admitting Physician Maximino DELENA Sharps, MD ERE:[id number], [name], DO  Brief Summary: Patient is a 73 y.o.  female with history of HTN, HLD who presented with worsening abdominal pain-CT imaging showed intraperitoneal free air consistent with bowel perforation  Significant events: 6/21>> evaluated in the ED for abdominal pain-found to have left mid ureteral stone-9 mm with hydronephrosis-and large volume of stool-managed with supportive care and discharged home. 6/22>> worsening abdominal pain-repeat CT scan consistent with bowel perforation-surgery consult-admit to [facility]. 7/04>> vomiting-recurrent ileus-NG tube reinserted  Significant studies: 6/22>> CT renal stone study: Interval development of intraperitoneal free air-moderate volume free fluid-consistent with bowel perforation, persistent large volume stool with dilated sigmoid colon.  Similar location of left ureteral stone-9 mm. 6/29>> CT abdomen/pelvis: S/p partial colectomy-LLQ ostomy-complex free fluid with multiple foci of air-most pronounced in the surgical bed-possibility of superimposed infection or bowel leak cannot be excluded. 6/30>> echo: EF 60-65%.  Significant microbiology data: 6/22>> urine culture: No growth 6/22>> blood culture: No growth 6/30>> pelvic abscess culture: Gram-negative rods/gram-positive cocci-mixed anaerobic flora.  Procedures: 6/22>> sigmoidectomy with end colostomy. 6/30>> CT-guided drain and pelvic abscess by IR  Consults: General Surgery IR Urology  Subjective: No major issues overnight-no further vomiting since NG tube reinserted.  Objective: Vitals: Blood pressure 118/64, pulse 89, temperature 98 F (36.7 C), temperature source Oral, resp. rate 16, height 5' 4 (1.626 m), weight 61.8 kg, SpO2 97%.   Exam: Awake/alert Not in  distress Abdomen: Soft-appropriately tender-minimally distended-brown stool seen in ostomy bag (per patient-this is old stool-bag has not been changed since yesterday)  Pertinent Labs/Radiology:    Latest Ref Rng & Units 08/12/2023    4:01 AM 08/10/2023    5:54 AM 08/09/2023    5:46 AM  CBC  WBC 4.0 - 10.5 K/uL 6.7  7.7  9.9   Hemoglobin 12.0 - 15.0 g/dL 9.0  8.7  8.9   Hematocrit 36.0 - 46.0 % 27.1  26.6  27.3   Platelets 150 - 400 K/uL 787  701  588     Lab Results  Component Value Date   NA 136 08/12/2023   K 4.1 08/12/2023   CL 95 (L) 08/12/2023   CO2 30 08/12/2023     Assessment/Plan: Sepsis secondary to stercoral colitis with ischemia/perforation of sigmoid colon with feculent peritonitis-s/p sigmoid colectomy/ostomy on 6/22-complicated by postoperative ileus and then residual pelvic abscess requiring CT-guided drain placement by on 6/30. Had improved but ileus likely occurred-vomiting on 7/4-NG tube in place Remains n.p.o. TNA being started CT abdomen/pelvis recommended by general surgery Remains on Zosyn .   Hydronephrosis secondary to distal left ureteral stone Urology following-with recommendations for supportive care for now-once her medical problems have stabilized-urology will arrange for lithotripsy versus stent placement. Continue to monitor renal function closely  Hospital delirium Secondary to acute illness/narcotics Completely awake/alert this morning Delirium precautions  Normocytic anemia Due to acute illness Follow CBC-if significant drop-will transfuse  Thrombocytosis Likely reactive Supportive care  Hypomagnesemia Repleted  Hypokalemia Replete/recheck  HTN BP stable with Coreg  Lisinopril  remains on hold Follow/optimize  Lower extremity edema Secondary to hypoalbuminemia/IV fluid resuscitation in the setting of acute illness Echo stable-UA negative for proteinuria Hold off on further furosemide  today given  concerns for  vomiting/ileus.  Chronic idiopathic constipation Bowel function recovering-brown stools Once more stable-will need to be placed on a bowel regimen.  Odynophagia secondary to oral/aphthous ulcers Improved on Valtrex and Magic mouthwash Supportive care  Glaucoma Timolol  eyedrops  Debility/deconditioning PT OT eval-Home health recommended.  Code status:   Code Status: Full Code   DVT Prophylaxis: Place TED hose Start: 08/09/23 1000 enoxaparin  (LOVENOX ) injection 40 mg Start: 07/31/23 1000 SCDs Start: 07/30/23 1954 SCDs Start: 07/30/23 1953   Family Communication: None at bedside.   Disposition Plan: Status is: Inpatient Remains inpatient appropriate because: Severity of illness   Planned Discharge Destination:Home health   Diet: Diet Order             Diet NPO time specified  Diet effective now                     Antimicrobial agents: Anti-infectives (From admission, onward)    Start     Dose/Rate Route Frequency Ordered Stop   08/06/23 1130  acyclovir  (ZOVIRAX ) 200 MG capsule 400 mg  Status:  Discontinued        400 mg Oral 3 times daily 08/06/23 1037 08/08/23 1424   08/06/23 1100  piperacillin -tazobactam (ZOSYN ) IVPB 3.375 g        3.375 g 12.5 mL/hr over 240 Minutes Intravenous Every 8 hours 08/06/23 1051     07/30/23 1800  piperacillin -tazobactam (ZOSYN ) IVPB 3.375 g        3.375 g 12.5 mL/hr over 240 Minutes Intravenous Every 8 hours 07/30/23 1332 08/04/23 2359   07/30/23 1400  piperacillin -tazobactam (ZOSYN ) IVPB 3.375 g  Status:  Discontinued        3.375 g 100 mL/hr over 30 Minutes Intravenous Every 8 hours 07/30/23 1329 07/30/23 1332   07/30/23 1015  piperacillin -tazobactam (ZOSYN ) IVPB 3.375 g        3.375 g 100 mL/hr over 30 Minutes Intravenous  Once 07/30/23 1013 07/30/23 1311        MEDICATIONS: Scheduled Meds:  acetaminophen   1,000 mg Oral Q6H   carvedilol   3.125 mg Oral BID WC   Chlorhexidine  Gluconate Cloth  6 each Topical  Daily   enoxaparin  (LOVENOX ) injection  40 mg Subcutaneous Q24H   feeding supplement  1 Container Oral TID BM   latanoprost   1 drop Both Eyes QHS   magic mouthwash  5 mL Oral QID   methocarbamol   500 mg Oral TID   Or   methocarbamol  (ROBAXIN ) injection  500 mg Intravenous TID   pantoprazole   40 mg Oral Daily   sodium chloride  flush  3 mL Intravenous Q12H   timolol   1 drop Both Eyes BID   Continuous Infusions:  sodium chloride  75 mL/hr at 08/11/23 1707   magnesium  sulfate bolus IVPB 2 g (08/12/23 0944)   piperacillin -tazobactam (ZOSYN )  IV 3.375 g (08/12/23 0939)   PRN Meds:.albuterol , hydrALAZINE , HYDROmorphone  (DILAUDID ) injection, metoprolol  tartrate, ondansetron  **OR** ondansetron  (ZOFRAN ) IV, mouth rinse, oxyCODONE , phenol   I have personally reviewed following labs and imaging studies  LABORATORY DATA: CBC: Recent Labs  Lab 08/07/23 0610 08/08/23 0831 08/09/23 0546 08/10/23 0554 08/12/23 0401  WBC 11.9* 11.1* 9.9 7.7 6.7  HGB 9.2* 9.1* 8.9* 8.7* 9.0*  HCT 28.0* 27.5* 27.3* 26.6* 27.1*  MCV 87.0 87.3 87.2 87.5 88.0  PLT 491* 597* 588* 701* 787*    Basic Metabolic Panel: Recent Labs  Lab 08/06/23 0807 08/07/23 0610 08/08/23 0831 08/09/23 9453 08/10/23 0554 08/11/23 0505 08/12/23 0401  NA 136 136 138 136 134* 135 136  K 4.2 3.8 3.2* 4.2 3.5 3.4* 4.1  CL 100 99 98 98 95* 97* 95*  CO2 21* 24 24 24 26 24 30   GLUCOSE 91 72 72 88 84 86 74  BUN 13 14 15 13 16 17 15   CREATININE 0.54 0.88 0.70 0.60 0.56 0.59 0.81  CALCIUM  8.1* 8.0* 7.6* 7.7* 7.8* 7.8* 8.0*  MG 1.7 1.8 1.9 2.0 1.6* 1.9 1.7  PHOS 3.0 3.6 3.0 2.8 3.2  --   --     GFR: Estimated Creatinine Clearance: 55.8 mL/min (by C-G formula based on SCr of 0.81 mg/dL).  Liver Function Tests: Recent Labs  Lab 08/06/23 0807 08/10/23 0554 08/11/23 0505  AST 24 17 13*  ALT 17 12 11   ALKPHOS 62 75 75  BILITOT 0.5 0.8 1.2  PROT 5.3* 5.0* 5.2*  ALBUMIN  1.6* 1.6* 1.7*   No results for input(s): LIPASE,  AMYLASE in the last 168 hours. No results for input(s): AMMONIA in the last 168 hours.  Coagulation Profile: Recent Labs  Lab 08/07/23 0909  INR 1.2    Cardiac Enzymes: No results for input(s): CKTOTAL, CKMB, CKMBINDEX, TROPONINI in the last 168 hours.  BNP (last 3 results) No results for input(s): PROBNP in the last 8760 hours.  Lipid Profile: No results for input(s): CHOL, HDL, LDLCALC, TRIG, CHOLHDL, LDLDIRECT in the last 72 hours.  Thyroid  Function Tests: No results for input(s): TSH, T4TOTAL, FREET4, T3FREE, THYROIDAB in the last 72 hours.  Anemia Panel: No results for input(s): VITAMINB12, FOLATE, FERRITIN, TIBC, IRON, RETICCTPCT in the last 72 hours.  Urine analysis:    Component Value Date/Time   COLORURINE STRAW (A) 08/06/2023 1123   APPEARANCEUR CLEAR 08/06/2023 1123   LABSPEC 1.009 08/06/2023 1123   PHURINE 6.0 08/06/2023 1123   GLUCOSEU NEGATIVE 08/06/2023 1123   HGBUR NEGATIVE 08/06/2023 1123   BILIRUBINUR NEGATIVE 08/06/2023 1123   KETONESUR 5 (A) 08/06/2023 1123   PROTEINUR NEGATIVE 08/06/2023 1123   NITRITE NEGATIVE 08/06/2023 1123   LEUKOCYTESUR NEGATIVE 08/06/2023 1123    Sepsis Labs: Lactic Acid, Venous    Component Value Date/Time   LATICACIDVEN 1.8 07/30/2023 1213    MICROBIOLOGY: Recent Results (from the past 240 hours)  Aerobic/Anaerobic Culture w Gram Stain (surgical/deep wound)     Status: None   Collection Time: 08/07/23  1:41 PM   Specimen: Abscess  Result Value Ref Range Status   Specimen Description ABSCESS  Final   Special Requests NONE  Final   Gram Stain   Final    ABUNDANT WBC PRESENT, PREDOMINANTLY PMN ABUNDANT GRAM NEGATIVE RODS ABUNDANT GRAM POSITIVE COCCI Performed at [facility], [address] [place], [place] [address]    Culture   Final    ABUNDANT MULTIPLE ORGANISMS PRESENT, NONE PREDOMINANT MIXED ANAEROBIC FLORA PRESENT.  CALL LAB IF FURTHER IID  REQUIRED.    Report Status 08/09/2023 FINAL  Final    RADIOLOGY STUDIES/RESULTS: US  EKG SITE RITE Result Date: 08/12/2023 If Site Rite image not attached, placement could not be confirmed due to current cardiac rhythm.  DG Abd 2 Views Result Date: 08/12/2023 CLINICAL DATA:  Follow-up ileus EXAM: ABDOMEN - 2 VIEW COMPARISON:  08/11/2023 FINDINGS: Enteric tube tip and side port are within the gastric fundus. Percutaneous drainage catheter enters from a right lower quadrant approach and terminates in the right hemipelvis. Persistent dilated loops of small bowel within the left hemiabdomen measure up to 4.3 cm, similar to previous  exam. No signs of pneumoperitoneum. IMPRESSION: Persistent dilated loops of small bowel within the left hemiabdomen, similar to previous exam. Electronically Signed   By: Waddell Calk M.D.   On: 08/12/2023 09:17   DG Abd 1 View Result Date: 08/11/2023 CLINICAL DATA:  NG tube placement EXAM: ABDOMEN - 1 VIEW COMPARISON:  08/11/2023 FINDINGS: NG tube tip is in the fundus of the stomach. Dilated small bowel loops. Gas within the colon, but the colon appears to be decompressed. No organomegaly or free air. IMPRESSION: NG tube tip in the stomach. Gaseous distension of multiple small bowel loops, similar to prior study. Electronically Signed   By: Franky Crease M.D.   On: 08/11/2023 18:41   DG Abd 1 View Result Date: 08/11/2023 CLINICAL DATA:  Follow-up ileus EXAM: ABDOMEN - 1 VIEW COMPARISON:  08/06/2023 radiograph and CT FINDINGS: Few air dilated loops of small bowel measuring up to 4 cm with scattered colon gas. Left lower quadrant ostomy. Drainage catheter in the right pelvis. Decreased stool in the colon. IMPRESSION: Few air dilated loops of small bowel with scattered colon gas, suggesting ileus, findings do not appear greatly changed compared to scout image from the CT 08/06/2023. Electronically Signed   By: Luke Bun M.D.   On: 08/11/2023 14:15      LOS: 13 days    Donalda Applebaum, MD  [facility]    To contact the attending provider between 7A-7P or the covering provider during after hours 7P-7A, please log into the web site [email or web address] and access using universal [place] password for that web site. If you do not have the password, please call the hospital operator.  08/12/2023, 10:43 AM

## 2023-08-12 NOTE — Plan of Care (Signed)
 NG tube still draining bile in the canister at low int suction.

## 2023-08-12 NOTE — Progress Notes (Addendum)
 PHARMACY - TOTAL PARENTERAL NUTRITION CONSULT NOTE   Indication: intolerance to feeding, recurrent ileus  Patient Measurements: Height: 5' 4 (162.6 cm) Weight: 61.8 kg (136 lb 3.9 oz) IBW/kg (Calculated) : 54.7 TPN AdjBW (KG): 57.2 Body mass index is 23.39 kg/m. Usual Weight: ~59 kg  Assessment:  73 y.o. F s/p exp lap with Hartmann's procedure for stercoral colitis with ischemia and perforation with feculent peritonitis on 6/22. Noted pt has been NPO/clear liquid/full liquid diet since after surgery with limited intake. Pt now with ileus on Xray (dilated loops of small bowel) and N/V with liquid diet so NGT replaced 7/4, now NPO and starting TPN. Pt at risk of refeeding with prolonged period of little po intake since admission.  Glucose / Insulin : No h/o DM.  Electrolytes: K 4.1 (goal >4 with ileus), Mg 1.7 (MD replaced) (goal >2 with ileus), Corr Ca 9.8 Renal: Scr <1, BUN wnl. Hepatic: LFTs wnl. Alb 1.7. Intake / Output; MIVF: NGT o/p 1450. Colostomy o/p not being recorded but has good o/p. IR drain 5 ml. MIVF: NS at 75 ml/hr GI Imaging: 7/5 abd xray - persistent dilated loops of small bowel GI Surgeries / Procedures:  6/22 exp lap with Hartmann's procedure for stercoral colitis with ischemia and perforation with feculent peritonitis  Central access: PICC to be placed 7/5 TPN start date: 7/5  Nutritional Goals: Awaiting RD assessment for goal calculations  RD Assessment:  Pending  Current Nutrition:  NPO  Plan:  Start Clinimix E 8/10 at 46ml/hr + SMOFlipids at 20.8 ml/hr x 12 hr to provide 80 gm protein and 1160 kcal. Will likely change to compounded TPN tomorrow and advance to goal rate as tolerated. Electrolytes in TPN: Na 46mEq/L, K 51mEq/L, Ca 4.5mEq/L, Mg 45mEq/L, and Phos 15mmol/L. Cl:Ac 1:1 Add standard MVI and trace elements to TPN Initiate very Sensitive q6h SSI and adjust as needed  Will add thiamine  100mg  IV q24h outside of TPN x 6 days (7/5-7/10) Reduce NS to  25 mL/hr at 1800 when TPN hangs Monitor TPN labs on Mon/Thurs, daily until stable F/u RD assessment for nutrition goals  Vito Ralph, PharmD, BCPS Please see amion for complete clinical pharmacist phone list 08/12/2023,10:53 AM

## 2023-08-12 NOTE — Progress Notes (Addendum)
 Referring Physician(s): Dr. Sherlon  Supervising Physician: Jenna Hacker  Patient Status:  [facility] - In-pt  Chief Complaint: Intra-abdominal fluid collection  Subjective: Drowsy, but arousable. NGT remains in place to suction.  Answered all questions of patient and family at bedside.   Allergies: Codeine  Medications: Prior to Admission medications   Medication Sig Start Date End Date Taking? Authorizing Provider  albuterol  (VENTOLIN  HFA) 108 (90 Base) MCG/ACT inhaler Inhale 2 puffs into the lungs every 6 (six) hours as needed for wheezing or shortness of breath. 03/03/22  Yes Kuneff, Renee A, DO  Calcium  Polycarbophil (FIBER-CAPS PO) Take 1 capsule by mouth daily at 12 noon.   Yes [provider]  HYDROcodone -acetaminophen  (NORCO/VICODIN) 5-325 MG tablet Take 1 tablet by mouth every 6 (six) hours as needed for moderate pain (pain score 4-6). 07/29/23  Yes Zackowski, Scott, MD  hydrocortisone  (ANUSOL -HC) 25 MG suppository Place 1 suppository (25 mg total) rectally 2 (two) times daily. 06/23/23  Yes Kuneff, Renee A, DO  hydrocortisone  cream 1 % Apply 1 Application topically 2 (two) times daily. 06/08/23  Yes Kuneff, Renee A, DO  latanoprost  (XALATAN ) 0.005 % ophthalmic solution Place 1 drop into both eyes at bedtime. 08/11/17  Yes [provider]  LINZESS 72 MCG capsule Take 72 mcg by mouth daily before breakfast. 06/26/23  Yes [provider]  lisinopril  (ZESTRIL ) 10 MG tablet TAKE 1 TABLET BY MOUTH DAILY 05/22/23  Yes Kuneff, Renee A, DO  Multiple Vitamin (MULTIVITAMIN) tablet Take 1 tablet by mouth daily.   Yes [provider]  ondansetron  (ZOFRAN -ODT) 4 MG disintegrating tablet Take 1 tablet (4 mg total) by mouth every 8 (eight) hours as needed for nausea or vomiting. 07/29/23  Yes Zackowski, Scott, MD  Probiotic Product (DAILY PROBIOTIC PO) Take 1 capsule by mouth daily at 12 noon.   Yes [provider]  timolol  (TIMOPTIC ) 0.5 %  ophthalmic solution Place 1 drop into both eyes 2 (two) times daily. 11/14/19  Yes [provider]  amoxicillin -clavulanate (AUGMENTIN ) 875-125 MG tablet Take 1 tablet by mouth every 12 (twelve) hours. Patient not taking: Reported on 07/31/2023 07/29/23   Zackowski, Scott, MD  atorvastatin  (LIPITOR) 20 MG tablet Take 1 tablet (20 mg total) by mouth at bedtime. Patient not taking: Reported on 02/22/2023 11/30/21   Catherine Fuller A, DO     Vital Signs: BP 118/64 (BP Location: Right Arm)   Pulse 89   Temp 98 F (36.7 C) (Oral)   Resp 16   Ht 5' 4 (1.626 m)   Wt 136 lb 3.9 oz (61.8 kg)   SpO2 97%   BMI 23.39 kg/m   Physical Exam NAD, drowsy after pain med but arousable Abdomen: generalized tenderness, ostomy in place. TG drain on the right.  Site intact.  Dressing clean and dry. Tan, murky fluid in bulb.   Imaging: US  EKG SITE RITE Result Date: 08/12/2023 If Site Rite image not attached, placement could not be confirmed due to current cardiac rhythm.  DG Abd 2 Views Result Date: 08/12/2023 CLINICAL DATA:  Follow-up ileus EXAM: ABDOMEN - 2 VIEW COMPARISON:  08/11/2023 FINDINGS: Enteric tube tip and side port are within the gastric fundus. Percutaneous drainage catheter enters from a right lower quadrant approach and terminates in the right hemipelvis. Persistent dilated loops of small bowel within the left hemiabdomen measure up to 4.3 cm, similar to previous exam. No signs of pneumoperitoneum. IMPRESSION: Persistent dilated loops of small bowel within the left hemiabdomen, similar to  previous exam. Electronically Signed   By: Waddell Calk M.D.   On: 08/12/2023 09:17   DG Abd 1 View Result Date: 08/11/2023 CLINICAL DATA:  NG tube placement EXAM: ABDOMEN - 1 VIEW COMPARISON:  08/11/2023 FINDINGS: NG tube tip is in the fundus of the stomach. Dilated small bowel loops. Gas within the colon, but the colon appears to be decompressed. No organomegaly or free air. IMPRESSION: NG tube tip in  the stomach. Gaseous distension of multiple small bowel loops, similar to prior study. Electronically Signed   By: Franky Crease M.D.   On: 08/11/2023 18:41   DG Abd 1 View Result Date: 08/11/2023 CLINICAL DATA:  Follow-up ileus EXAM: ABDOMEN - 1 VIEW COMPARISON:  08/06/2023 radiograph and CT FINDINGS: Few air dilated loops of small bowel measuring up to 4 cm with scattered colon gas. Left lower quadrant ostomy. Drainage catheter in the right pelvis. Decreased stool in the colon. IMPRESSION: Few air dilated loops of small bowel with scattered colon gas, suggesting ileus, findings do not appear greatly changed compared to scout image from the CT 08/06/2023. Electronically Signed   By: Luke Bun M.D.   On: 08/11/2023 14:15    Labs:  CBC: Recent Labs    08/08/23 0831 08/09/23 0546 08/10/23 0554 08/12/23 0401  WBC 11.1* 9.9 7.7 6.7  HGB 9.1* 8.9* 8.7* 9.0*  HCT 27.5* 27.3* 26.6* 27.1*  PLT 597* 588* 701* 787*    COAGS: Recent Labs    07/29/23 0826 08/07/23 0909  INR 1.1 1.2    BMP: Recent Labs    08/09/23 0546 08/10/23 0554 08/11/23 0505 08/12/23 0401  NA 136 134* 135 136  K 4.2 3.5 3.4* 4.1  CL 98 95* 97* 95*  CO2 24 26 24 30   GLUCOSE 88 84 86 74  BUN 13 16 17 15   CALCIUM  7.7* 7.8* 7.8* 8.0*  CREATININE 0.60 0.56 0.59 0.81  GFRNONAA >60 >60 >60 >60    LIVER FUNCTION TESTS: Recent Labs    07/31/23 0257 08/06/23 0807 08/10/23 0554 08/11/23 0505  BILITOT 0.7 0.5 0.8 1.2  AST 16 24 17  13*  ALT 10 17 12 11   ALKPHOS 25* 62 75 75  PROT 3.6* 5.3* 5.0* 5.2*  ALBUMIN  1.6* 1.6* 1.6* 1.7*    Assessment and Plan: Intra-abdominal fluid collection s/p drain placement 6/30/5 by Dr. Philip Afebrile.  Drain output recorded at 140 mL yesterday, only a small amount of fluid in bulb this AM.  Remain tan, murky in appearance.  Still with NGT to suction.  Drowsy on pain medication but arouses to ask questions which are answered.  Continue current drain management.     Electronically Signed: Kaiden Dardis Sue-Ellen Denham Mose, PA 08/12/2023, 12:58 PM   I spent a total of 15 Minutes at the the patient's bedside AND on the patient's hospital floor or unit, greater than 50% of which was counseling/coordinating care for intra-abdominal fluid collection.

## 2023-08-12 NOTE — Progress Notes (Signed)
 Nutrition Follow-up  DOCUMENTATION CODES:   Not applicable  INTERVENTION:   HIGH refeeding risk due to NPO/CL/FL diet with poor tolerance of po x 13 days  TPN to meet estimated nutritional needs; TPN order per Pharmacy IV Thiamine  100 mg for at least 5 days Monitor magnesium , potassium, and phosphorus daily for at least 3 days, MD to replete as needed, as pt is at risk for refeeding syndrome  Daily weights Requesting new measured weight now given no wt in >1 week   NUTRITION DIAGNOSIS:   Inadequate oral intake related to altered GI function, acute illness as evidenced by NPO status.   GOAL:   Patient will meet greater than or equal to 90% of their needs  MONITOR:   Diet advancement, I & O's, Weight trends, Labs, Skin (TPN tolerance)  REASON FOR ASSESSMENT:   Consult New TPN/TNA  ASSESSMENT:   73 yo female with stercoral colitis admitted on 6/22 with ischemia and perforation with feculent peritonitis requiring sigmoidectomy with end colostomy. Pt seen in ED on 6/21 with CT indicating colitis with large stool burden-discharged on antibiotics. Post-op course complicated by pelvic abscess and post-op ileus, inability to tolerate po. PMH includes chronic idiopathic constipation, HLD, HTN  73 yo female with stercoral colitis admitted on 6/22 with ischemia and perforation with feculent peritonitis requiring sigmoidectomy with end colostomy. Pt seen in ED on 6/21 with CT indicating colitis with large stool burden-discharged on antibiotics. Post-op course complicated by pelvic abscess and post-op ileus, inability to tolerate po. PMH includes chronic idiopathic constipation, HLD, HTN  6/22 Admitted, Ex Lap, open sigmoid colectomy with colostomy for stercoral colitis with ischemia and perforation with feculent peritonitis 6/23 NPO with ice chips 6/25 CL diet initiated 6/28 Advanced to [facility] diet 6/29 Back to NPO, CT A/P: complex free fluid with multiple foci of air, possible infection  or bowel leak 6/30 CT-guided drainage of pelvic abscess by IR, drain left in place, ECHO EF 60-65% 7/01 Started back on CL diet 7/02 Advanced to [facility] Diet 7/03 Diet downgraded to CL  due to abdominal distention 7/04 Not tolerating CL diet, NG to LIS, NPO 7/05 Abd xray with persistent dilated loops of small bowel within the left hemiabdomen measure up to 4.3 cm. PICC line placement with TPN initiation  NPO. Pt not tolerating oral diet, +N/V with abd distention. NG tube replaced yesterday to LIS with significant output, 2250 mL in 24 hours, 1L out so far today. Pt feels better post NG insertion LLQ Colostomy with +stool output-pt reporting stool in bag today is old, not changed since yesterday IR drain to pelvic abscess: minimal output  Abd xray today:  Persistent dilated loops of small bowel within the left hemiabdomen measure up to 4.3 cm. Planning CT A/P today to exclude abscess as cause of ileus  NPO,CL or FL diet since admission (13 days). Limited documentation of po intake with last recorded intake from 6/27 (50% of CL tray). RD suspects pt po intake would have been limited since admission post surgery given N/V, abdominal discomfort, ileus.   Noted pt with odynophagia this admission, exam significant for oral ulcers per MD, pt started on antiviral and magic mouthwash.   Current wt 61.8 kg but from 6/23 (> 1 week ago). Requesting new weight and now daily weights with TPN. Admit weights (6/22) 57-60 kg (all on same day)  Unable to obtain nutrition history from patient at this time   BLE edema present  Labs: Sodium 136 (L)  Potassium 4.1 (  wdl) BUN/Creatinine wdl Magnesium  1.7 (wdl) Phosphorus not checked today but wdl 7/3  Meds:  Protonix  Thiamine  SS novolog  ordered today  Diet Order:   Diet Order             Diet NPO time specified  Diet effective now                   EDUCATION NEEDS:   Not appropriate for education at this time  Skin:  Skin Assessment: Skin  Integrity Issues: Skin Integrity Issues:: Incisions Incisions: abdomen (closed)  Last BM:  7/5 via colostomy  Height:   Ht Readings from Last 1 Encounters:  07/30/23 5' 4 (1.626 m)    Weight:   Wt Readings from Last 1 Encounters:  07/31/23 61.8 kg    BMI:  Body mass index is 23.39 kg/m.  Estimated Nutritional Needs:   Kcal:  1650-1850 kcals  Protein:  80-90 g  Fluid:  >/= 1.7 L  Betsey Finger MS, RDN, LDN, CNSC Registered Dietitian 3 Clinical Nutrition RD Inpatient Contact Info in Amion

## 2023-08-12 NOTE — Progress Notes (Signed)
 Peripherally Inserted Central Catheter Placement  The IV Nurse has discussed with the patient and/or persons authorized to consent for the patient, the purpose of this procedure and the potential benefits and risks involved with this procedure.  The benefits include less needle sticks, lab draws from the catheter, and the patient may be discharged home with the catheter. Risks include, but not limited to, infection, bleeding, blood clot (thrombus formation), and puncture of an artery; nerve damage and irregular heartbeat and possibility to perform a PICC exchange if needed/ordered by physician.  Alternatives to this procedure were also discussed.  Bard Power PICC patient education guide, fact sheet on infection prevention and patient information card has been provided to patient /or left at bedside.    PICC Placement Documentation  PICC Double Lumen 08/12/23 Right Basilic 37 cm 1 cm (Active)  Indication for Insertion or Continuance of Line Administration of hyperosmolar/irritating solutions (i.e. TPN, Vancomycin, etc.) 08/12/23 1139  Exposed Catheter (cm) 1 cm 08/12/23 1139  Site Assessment Clean, Dry, Intact 08/12/23 1139  Lumen #1 Status Flushed;Saline locked;Blood return noted 08/12/23 1139  Lumen #2 Status Flushed;Saline locked;Blood return noted 08/12/23 1139  Dressing Type Transparent;Securing device 08/12/23 1139  Dressing Status Antimicrobial disc/dressing in place;Clean, Dry, Intact 08/12/23 1139  Line Care Connections checked and tightened 08/12/23 1139  Line Adjustment (NICU/IV Team Only) No 08/12/23 1139  Dressing Intervention New dressing;Adhesive placed at insertion site (IV team only);Adhesive placed around edges of dressing (IV team/ICU RN only) 08/12/23 1139  Dressing Change Due 08/19/23 08/12/23 1139       Angela [name] 08/12/2023, 11:40 AM

## 2023-08-13 DIAGNOSIS — K631 Perforation of intestine (nontraumatic): Secondary | ICD-10-CM | POA: Diagnosis not present

## 2023-08-13 DIAGNOSIS — N1330 Unspecified hydronephrosis: Secondary | ICD-10-CM | POA: Diagnosis not present

## 2023-08-13 DIAGNOSIS — I10 Essential (primary) hypertension: Secondary | ICD-10-CM | POA: Diagnosis not present

## 2023-08-13 DIAGNOSIS — N201 Calculus of ureter: Secondary | ICD-10-CM | POA: Diagnosis not present

## 2023-08-13 LAB — MAGNESIUM: Magnesium: 1.7 mg/dL (ref 1.7–2.4)

## 2023-08-13 LAB — GLUCOSE, CAPILLARY
Glucose-Capillary: 142 mg/dL — ABNORMAL HIGH (ref 70–99)
Glucose-Capillary: 162 mg/dL — ABNORMAL HIGH (ref 70–99)
Glucose-Capillary: 141 mg/dL — ABNORMAL HIGH (ref 70–99)
Glucose-Capillary: 125 mg/dL — ABNORMAL HIGH (ref 70–99)
Glucose-Capillary: 114 mg/dL — ABNORMAL HIGH (ref 70–99)

## 2023-08-13 LAB — BASIC METABOLIC PANEL WITH GFR
Anion gap: 9 (ref 5–15)
BUN: 17 mg/dL (ref 8–23)
CO2: 31 mmol/L (ref 22–32)
Calcium: 7.3 mg/dL — ABNORMAL LOW (ref 8.9–10.3)
Chloride: 98 mmol/L (ref 98–111)
Creatinine, Ser: 0.5 mg/dL (ref 0.44–1.00)
GFR, Estimated: >60 mL/min (ref >60)
Glucose, Bld: 137 mg/dL — ABNORMAL HIGH (ref 70–99)
Potassium: 2.8 mmol/L — ABNORMAL LOW (ref 3.5–5.1)
Sodium: 138 mmol/L (ref 135–145)

## 2023-08-13 LAB — PHOSPHORUS: Phosphorus: 2.1 mg/dL — ABNORMAL LOW (ref 2.5–4.6)

## 2023-08-13 MED ORDER — POTASSIUM CHLORIDE 10 MEQ/50ML IV SOLN
10.0000 meq | INTRAVENOUS | Status: DC
  Filled 2023-08-13 (×4): qty 50

## 2023-08-13 MED ORDER — TRACE MINERALS CU-MN-SE-ZN 300-55-60-3000 MCG/ML IV SOLN
INTRAVENOUS | Status: AC
  Filled 2023-08-13 (×2): qty 1000

## 2023-08-13 MED ORDER — POTASSIUM CHLORIDE IN NACL 40-0.9 MEQ/L-% IV SOLN
INTRAVENOUS | Status: AC
  Filled 2023-08-13: qty 1000

## 2023-08-13 MED ORDER — MAGNESIUM SULFATE 50 % IJ SOLN
3.0000 g | Freq: Once | INTRAVENOUS | Status: AC
  Administered 2023-08-13: 3 g via INTRAVENOUS
  Filled 2023-08-13: qty 6

## 2023-08-13 MED ORDER — MELATONIN 5 MG PO TABS
5.0000 mg | ORAL_TABLET | Freq: Every evening | ORAL | Status: DC | PRN
  Administered 2023-08-13 – 2023-08-15 (×2): 5 mg
  Filled 2023-08-13 (×2): qty 1

## 2023-08-13 MED ORDER — POTASSIUM CHLORIDE 10 MEQ/50ML IV SOLN
10.0000 meq | INTRAVENOUS | Status: AC
  Administered 2023-08-13 (×4): 10 meq via INTRAVENOUS
  Filled 2023-08-13 (×4): qty 50

## 2023-08-13 MED ORDER — FAT EMUL FISH OIL/PLANT BASED 20% (SMOFLIPID)IV EMUL
250.0000 mL | INTRAVENOUS | Status: AC
  Administered 2023-08-13: 250 mL via INTRAVENOUS
  Filled 2023-08-13: qty 250

## 2023-08-13 MED ORDER — POTASSIUM PHOSPHATES 15 MMOLE/5ML IV SOLN
30.0000 mmol | Freq: Once | INTRAVENOUS | Status: AC
  Administered 2023-08-13: 30 mmol via INTRAVENOUS
  Filled 2023-08-13: qty 10

## 2023-08-13 NOTE — Progress Notes (Signed)
 14 Days Post-Op   Subjective/Chief Complaint: Patient with nausea today. NG tube in place. Feels better with tube. She did not tolerate CLD.    Objective: Vital signs in last 24 hours: Temp:  [98 F (36.7 C)] 98 F (36.7 C) (07/05 1925) Pulse Rate:  [88-92] 92 (07/06 0400) Resp:  [15-27] 17 (07/06 0400) BP: (113-121)/(46-61) 119/46 (07/06 0400) SpO2:  [88 %-94 %] 94 % (07/06 0400) Last BM Date : 08/11/23  Intake/Output from previous day: 07/05 0701 - 07/06 0700 In: 1928.9 [P.O.:120; I.V.:1603.8; IV Piggyback:205.1] Out: 4305 [Urine:1550; Emesis/NG output:2450; Drains:5; Stool:300] Intake/Output this shift: No intake/output data recorded.    Lab Results:  Gen: NAD Lungs: effort nonlabored  Abd: soft, colostomy viable and with good output, liquid stool.  Mild distention. Midline wound fascia intact and no further drainage .  IR drain with minimal more thin, but still cloudy/feculent   BMET Recent Labs    08/12/23 0401 08/13/23 0325  NA 136 138  K 4.1 2.8*  CL 95* 98  CO2 30 31  GLUCOSE 74 137*  BUN 15 17  CREATININE 0.81 0.50  CALCIUM  8.0* 7.3*   PT/INR No results for input(s): LABPROT, INR in the last 72 hours. ABG No results for input(s): PHART, HCO3 in the last 72 hours.  Invalid input(s): PCO2, PO2  Studies/Results: CT ABDOMEN PELVIS W CONTRAST Result Date: 08/12/2023 CLINICAL DATA:  Postop abdominal pain. Patient underwent exploratory laparotomy with Hartmann's procedure for stercoral colitis and perforation. Subsequent pelvic percutaneous drain placement for abscess. EXAM: CT ABDOMEN AND PELVIS WITH CONTRAST TECHNIQUE: Multidetector CT imaging of the abdomen and pelvis was performed using the standard protocol following bolus administration of intravenous contrast. RADIATION DOSE REDUCTION: This exam was performed according to the departmental dose-optimization program which includes automated exposure control, adjustment of the mA and/or kV  according to patient size and/or use of iterative reconstruction technique. CONTRAST:  75mL OMNIPAQUE  IOHEXOL  350 MG/ML SOLN COMPARISON:  Abdominopelvic CT 08/06/2023. FINDINGS: Lower chest: Trace left pleural effusion and mild bibasilar atelectasis again noted, similar to previous CT. There is atherosclerosis of the aorta and coronary arteries. Hepatobiliary: The liver is normal in density without suspicious focal abnormality. Stable hepatic cysts. No evidence of gallstones, gallbladder wall thickening or biliary dilatation. Pancreas: Unremarkable. No pancreatic ductal dilatation or surrounding inflammatory changes. Spleen: Normal in size without focal abnormality. Adrenals/Urinary Tract: Both adrenal glands appear normal. There is a persistent partially obstructing calculus in the distal left ureter, measuring 11 mm on coronal image 65/7. Resulting hydronephrosis and hydroureter with delayed contrast excretion. No evidence of right ureteral obstruction. There are small renal cysts bilaterally for which no specific follow-up imaging is recommended. The bladder appears unremarkable for its degree of distention. Stomach/Bowel: No enteric contrast administered. Nasogastric tube extends into the mid stomach. The stomach is decompressed. Multiple fluid-filled loops of dilated small bowel are again noted, without focal transition point or significant wall thickening. The appendix is not clearly visualized, although no pericecal inflammatory changes are identified. Colonic stool burden has improved compared with the previous study. The colon is relatively decompressed proximal to the sigmoid colostomy. The rectal pouch is fluid-filled and mildly distended. Vascular/Lymphatic: There are no enlarged abdominal or pelvic lymph nodes. Aortic and branch vessel atherosclerosis without evidence of aneurysm or large vessel occlusion. The portal, superior mesenteric and splenic veins are patent. Reproductive: The uterus and ovaries  appear unremarkable. No evidence of adnexal mass. Other: Open midline anterior abdominal wall incision. New right transgluteal percutaneous drain  appears appropriately position within the previously demonstrated complex pelvic fluid collection. However, this collection remains sizable and has slightly enlarged compared with the previous study, measuring 8.0 x 6.7 cm on image 65/4. This collection likely communicates with an adjacent collection along the left pelvic sidewall, measuring 7.0 x 2.9 cm on image 68/4 and abuts the rectal pouch suture line. There are other less well-defined but enlarging peritoneal fluid collections with associated air bubbles and peritoneal enhancement, highly suspicious for peritonitis. A component inferior to the cecum measures up to 8.0 x 6.0 cm on image 58/4, and there is a component inferior to the colostomy measuring 7.4 x 5.3 cm on image 52/4. Musculoskeletal: No acute or significant osseous findings. Multilevel spondylosis. No evidence of discitis or osteomyelitis. IMPRESSION: 1. New right transgluteal percutaneous drain appears appropriately positioned within the previously demonstrated complex pelvic fluid collection. However, this collection remains sizable and has slightly enlarged compared with the previous study. This abuts the rectal suture line and a leak in this area cannot be excluded. 2. There are other less well-defined but enlarging peritoneal fluid collections with associated air bubbles and peritoneal enhancement, highly suspicious for peritonitis. 3. Persistent partially obstructing 11 mm calculus in the distal left ureter with resulting hydronephrosis and hydroureter. 4. Persistent fluid-filled loops of dilated small bowel without focal transition point, likely ileus. 5. No other significant parenchymal findings. 6.  Aortic Atherosclerosis (ICD10-I70.0). 7. These results will be called to the ordering clinician or representative by the Radiologist Assistant, and  communication documented in the PACS or [employer]. Electronically Signed   By: Elsie Perone M.D.   On: 08/12/2023 19:06   US  EKG SITE RITE Result Date: 08/12/2023 If Site Rite image not attached, placement could not be confirmed due to current cardiac rhythm.  DG Abd 2 Views Result Date: 08/12/2023 CLINICAL DATA:  Follow-up ileus EXAM: ABDOMEN - 2 VIEW COMPARISON:  08/11/2023 FINDINGS: Enteric tube tip and side port are within the gastric fundus. Percutaneous drainage catheter enters from a right lower quadrant approach and terminates in the right hemipelvis. Persistent dilated loops of small bowel within the left hemiabdomen measure up to 4.3 cm, similar to previous exam. No signs of pneumoperitoneum. IMPRESSION: Persistent dilated loops of small bowel within the left hemiabdomen, similar to previous exam. Electronically Signed   By: Waddell Calk M.D.   On: 08/12/2023 09:17   DG Abd 1 View Result Date: 08/11/2023 CLINICAL DATA:  NG tube placement EXAM: ABDOMEN - 1 VIEW COMPARISON:  08/11/2023 FINDINGS: NG tube tip is in the fundus of the stomach. Dilated small bowel loops. Gas within the colon, but the colon appears to be decompressed. No organomegaly or free air. IMPRESSION: NG tube tip in the stomach. Gaseous distension of multiple small bowel loops, similar to prior study. Electronically Signed   By: Franky Crease M.D.   On: 08/11/2023 18:41   DG Abd 1 View Result Date: 08/11/2023 CLINICAL DATA:  Follow-up ileus EXAM: ABDOMEN - 1 VIEW COMPARISON:  08/06/2023 radiograph and CT FINDINGS: Few air dilated loops of small bowel measuring up to 4 cm with scattered colon gas. Left lower quadrant ostomy. Drainage catheter in the right pelvis. Decreased stool in the colon. IMPRESSION: Few air dilated loops of small bowel with scattered colon gas, suggesting ileus, findings do not appear greatly changed compared to scout image from the CT 08/06/2023. Electronically Signed   By: Luke Bun M.D.   On:  08/11/2023 14:15    Anti-infectives: Anti-infectives (From admission,  onward)    Start     Dose/Rate Route Frequency Ordered Stop   08/06/23 1130  acyclovir  (ZOVIRAX ) 200 MG capsule 400 mg  Status:  Discontinued        400 mg Oral 3 times daily 08/06/23 1037 08/08/23 1424   08/06/23 1100  piperacillin -tazobactam (ZOSYN ) IVPB 3.375 g        3.375 g 12.5 mL/hr over 240 Minutes Intravenous Every 8 hours 08/06/23 1051     07/30/23 1800  piperacillin -tazobactam (ZOSYN ) IVPB 3.375 g        3.375 g 12.5 mL/hr over 240 Minutes Intravenous Every 8 hours 07/30/23 1332 08/04/23 2359   07/30/23 1400  piperacillin -tazobactam (ZOSYN ) IVPB 3.375 g  Status:  Discontinued        3.375 g 100 mL/hr over 30 Minutes Intravenous Every 8 hours 07/30/23 1329 07/30/23 1332   07/30/23 1015  piperacillin -tazobactam (ZOSYN ) IVPB 3.375 g        3.375 g 100 mL/hr over 30 Minutes Intravenous  Once 07/30/23 1013 07/30/23 1311       Assessment/Plan: Procedure(s): LAPAROTOMY, EXPLORATORY (N/A) COLECTOMY, SIGMOID, OPEN s/p Procedure(s): LAPAROTOMY, EXPLORATORY (N/A) COLECTOMY, SIGMOID, OPEN POD 14, s/p ex lap with Hartmann's procedure for stercoral colitis with ischemia and perforation with feculent peritonitis, Dr. Polly 6/22  - N.p.o. status.  CT scan (7/5) shows collection remains sizable and has slightly enlarged compared with the previous study. This abuts the rectal suture line and a leak in this area cannot be excluded.   -TNA therefore have ordered PICC line and can start TNA due to feeding intolerance.    -Mobilizing well in hallway. -continue abx due to pelvic abscess, WBC normal though -BID dressing changes to midline wound  -WOC following -pulm toilet  -  FEN -n.p.o./TNA VTE - lovenox  ID - zosyn     Nephrolithiasis - per urology,  Chronic constipation - bowel regimen HTN Glaucoma  LOS: 14 days    [employer] PA-C  08/13/2023

## 2023-08-13 NOTE — Progress Notes (Signed)
 PHARMACY - TOTAL PARENTERAL NUTRITION CONSULT NOTE   Indication: intolerance to feeding, recurrent ileus  Patient Measurements: Height: 5' 4 (162.6 cm) Weight: 61.8 kg (136 lb 3.9 oz) IBW/kg (Calculated) : 54.7 TPN AdjBW (KG): 57.2 Body mass index is 23.39 kg/m. Usual Weight: ~59 kg  Assessment:  73 y.o. F s/p exp lap with Hartmann's procedure for stercoral colitis with ischemia and perforation with feculent peritonitis on 6/22. Noted pt has been NPO/clear liquid/full liquid diet since after surgery with limited intake. Pt now with ileus on Xray (dilated loops of small bowel) and N/V with liquid diet so NGT replaced 7/4, now NPO and starting TPN. Pt at risk of refeeding with prolonged period of little po intake since admission.  Glucose / Insulin : No h/o DM. CBGs <180. No SSI given. Electrolytes: K 2.8 (goal >4 with ileus), Mg 1.7 (goal >2 with ileus), Phos 2.1, Corr Ca 9.1 Renal: Scr <1, BUN wnl. Hepatic: LFTs wnl. Alb 1.7. Intake / Output; MIVF: NGT o/p 2600. Colostomy o/p 300. UOP 800 ml yday (not all of urine charted).  IR drain 5 ml. Neg 1.7L yesterday. MIVF: NS at 75 ml/hr (forgot to to enter order to reduce MIVF yesterday when TPN started (safety zone entered) but appears pt needed the extra fluid anyways). GI Imaging: 7/5 abd xray - persistent dilated loops of small bowel GI Surgeries / Procedures:  6/22 exp lap with Hartmann's procedure for stercoral colitis with ischemia and perforation with feculent peritonitis  Central access: PICC to be placed 7/5 TPN start date: 7/5  Nutritional Goals: Goal TPN (AA 50g/L, dextrose  15%, lipids 25g/L) at 75 ml/hr will provide 1728 kcal and 90 gm protein.  RD Assessment: Estimated Needs Total Energy Estimated Needs: 1650-1850 kcals Total Protein Estimated Needs: 80-90 g Total Fluid Estimated Needs: >/= 1.7 L  Current Nutrition:  NPO TPN  Plan:  Continue Clinimix E 8/10 at 46ml/hr + SMOFlipids at 20.8 ml/hr x 12 hr to provide 80  gm protein and 1160 kcal. Will not change to compounded TPN or advance further today with drop in lytes. Electrolytes in TPN: Na 23mEq/L, K 27mEq/L, Ca 4.5mEq/L, Mg 69mEq/L, and Phos 15mmol/L. Cl:Ac 1:1 Kphos 30 mmol IVPB KCl 10mEq/50ml q1h x 4 Add K 40mEq/L to NS and continue at 75 ml/hr Add standard MVI and trace elements to TPN Continue very Sensitive q6h SSI and adjust as needed  Will add thiamine  100mg  IV q24h outside of TPN x 6 days (7/5-7/10) Monitor TPN labs on Mon/Thurs, daily until stable  Vito Ralph, PharmD, BCPS Please see amion for complete clinical pharmacist phone list 08/13/2023,8:50 AM

## 2023-08-13 NOTE — Progress Notes (Signed)
 PROGRESS NOTE        PATIENT DETAILS Name: Angela [name] Age: 73 y.o. Sex: female Date of Birth: 1951/01/09 Admit Date: 07/30/2023 Admitting Physician Maximino DELENA Sharps, MD ERE:[id number], [name], DO  Brief Summary: Patient is a 73 y.o.  female with history of HTN, HLD who presented with worsening abdominal pain-CT imaging showed intraperitoneal free air consistent with bowel perforation  Significant events: 6/21>> evaluated in the ED for abdominal pain-found to have left mid ureteral stone-9 mm with hydronephrosis-and large volume of stool-managed with supportive care and discharged home. 6/22>> worsening abdominal pain-repeat CT scan consistent with bowel perforation-surgery consult-admit to [facility]. 7/04>> vomiting-recurrent ileus-NG tube reinserted 7/05>> TNA started  Significant studies: 6/22>> CT renal stone study: Interval development of intraperitoneal free air-moderate volume free fluid-consistent with bowel perforation, persistent large volume stool with dilated sigmoid colon.  Similar location of left ureteral stone-9 mm. 6/29>> CT abdomen/pelvis: S/p partial colectomy-LLQ ostomy-complex free fluid with multiple foci of air-most pronounced in the surgical bed-possibility of superimposed infection or bowel leak cannot be excluded. 6/30>> echo: EF 60-65%. 7/05>> CT abdomen/pelvis: Previous collection with pigtail has increased in size, other less well-defined but enlarging peritoneal fluid collections.  Persistent-partially obstructing 11 mm calculus in the distal left ureter.  Persistent fluid loops of dilated small bowel-likely ileus.  Significant microbiology data: 6/22>> urine culture: No growth 6/22>> blood culture: No growth 6/30>> pelvic abscess culture: Gram-negative rods/gram-positive cocci-mixed anaerobic flora.  Procedures: 6/22>> sigmoidectomy with end colostomy. 6/30>> CT-guided drain and pelvic abscess by IR  Consults: General  Surgery IR Urology  Subjective: No major issues overnight-appears unchanged-wanting to drink some apple juice.  Objective: Vitals: Blood pressure (!) 119/46, pulse 92, temperature 98 F (36.7 C), temperature source Oral, resp. rate 17, height 5' 4 (1.626 m), weight 61.8 kg, SpO2 94%.   Exam: Awake/alert Not in any distress Chest: Clear to auscultation CVS: S1-S2 regular Abdomen: Soft-appropriately tender-mildly distended-brown stools in ostomy. Nonfocal exam Extremities: Mild edema  Pertinent Labs/Radiology:    Latest Ref Rng & Units 08/12/2023    4:01 AM 08/10/2023    5:54 AM 08/09/2023    5:46 AM  CBC  WBC 4.0 - 10.5 K/uL 6.7  7.7  9.9   Hemoglobin 12.0 - 15.0 g/dL 9.0  8.7  8.9   Hematocrit 36.0 - 46.0 % 27.1  26.6  27.3   Platelets 150 - 400 K/uL 787  701  588     Lab Results  Component Value Date   NA 138 08/13/2023   K 2.8 (L) 08/13/2023   CL 98 08/13/2023   CO2 31 08/13/2023     Assessment/Plan: Sepsis secondary to stercoral colitis with ischemia/perforation of sigmoid colon with feculent peritonitis-s/p sigmoid colectomy/ostomy on 6/22-complicated by postoperative ileus and then residual pelvic abscess requiring CT-guided drain placement by on 6/30. Had improved-but unfortunately had a recurrence of ileus-requiring reinsertion of NG tube on 7/4-TNA started on 7/5 Remains on Zosyn  Repeat CT with enlarging fluid collections in the peritoneum General Surgery following Remains on Zosyn  Await further recommendations from general surgery service   Hydronephrosis secondary to distal left ureteral stone Urology following-with recommendations for supportive care for now-once her medical problems have stabilized-urology will arrange for lithotripsy versus stent placement. Continue to monitor renal function closely  Hospital delirium Secondary to acute illness/narcotics Completely awake/alert this morning Delirium precautions  Normocytic  anemia Due to acute  illness Follow CBC-if significant drop-will transfuse  Thrombocytosis Likely reactive Supportive care  Hypomagnesemia/hypophosphatemia/hypokalemia Secondary to NG tube suctioning Replete per pharmacy protocol-as patient on TNA  HTN BP stable-all oral antihypertensives on hold due to n.p.o. status Continue with as needed IV hydralazine  and metoprolol .   Lower extremity edema Secondary to hypoalbuminemia/IV fluid resuscitation in the setting of acute illness Echo stable-UA negative for proteinuria Hold off on further furosemide  today given concerns for vomiting/ileus.  Chronic idiopathic constipation Bowel function recovering-brown stools Once more stable-will need to be placed on a bowel regimen.  Odynophagia secondary to oral/aphthous ulcers Improved on Valtrex and Magic mouthwash Supportive care  Glaucoma Timolol  eyedrops  Debility/deconditioning PT OT eval-Home health recommended.  Code status:   Code Status: Full Code   DVT Prophylaxis: Place TED hose Start: 08/09/23 1000 enoxaparin  (LOVENOX ) injection 40 mg Start: 07/31/23 1000 SCDs Start: 07/30/23 1954 SCDs Start: 07/30/23 1953   Family Communication: Multiple family members at bedside.   Disposition Plan: Status is: Inpatient Remains inpatient appropriate because: Severity of illness   Planned Discharge Destination:Home health   Diet: Diet Order             Diet NPO time specified  Diet effective now                     Antimicrobial agents: Anti-infectives (From admission, onward)    Start     Dose/Rate Route Frequency Ordered Stop   08/06/23 1130  acyclovir  (ZOVIRAX ) 200 MG capsule 400 mg  Status:  Discontinued        400 mg Oral 3 times daily 08/06/23 1037 08/08/23 1424   08/06/23 1100  piperacillin -tazobactam (ZOSYN ) IVPB 3.375 g        3.375 g 12.5 mL/hr over 240 Minutes Intravenous Every 8 hours 08/06/23 1051     07/30/23 1800  piperacillin -tazobactam (ZOSYN ) IVPB 3.375 g         3.375 g 12.5 mL/hr over 240 Minutes Intravenous Every 8 hours 07/30/23 1332 08/04/23 2359   07/30/23 1400  piperacillin -tazobactam (ZOSYN ) IVPB 3.375 g  Status:  Discontinued        3.375 g 100 mL/hr over 30 Minutes Intravenous Every 8 hours 07/30/23 1329 07/30/23 1332   07/30/23 1015  piperacillin -tazobactam (ZOSYN ) IVPB 3.375 g        3.375 g 100 mL/hr over 30 Minutes Intravenous  Once 07/30/23 1013 07/30/23 1311        MEDICATIONS: Scheduled Meds:  acetaminophen   1,000 mg Oral Q6H   carvedilol   3.125 mg Oral BID WC   Chlorhexidine  Gluconate Cloth  6 each Topical Daily   enoxaparin  (LOVENOX ) injection  40 mg Subcutaneous Q24H   insulin  aspart  0-6 Units Subcutaneous Q6H   latanoprost   1 drop Both Eyes QHS   magic mouthwash  5 mL Oral QID   methocarbamol   500 mg Oral TID   Or   methocarbamol  (ROBAXIN ) injection  500 mg Intravenous TID   pantoprazole   40 mg Oral Daily   sodium chloride  flush  3 mL Intravenous Q12H   thiamine  (VITAMIN B1) injection  100 mg Intravenous Q24H   timolol   1 drop Both Eyes BID   Continuous Infusions:  sodium chloride  Stopped (08/12/23 1428)   0.9 % NaCl with KCl 40 mEq / L     TPN (CLINIMIX-E) Adult     And   fat emul(SMOFlipid )     magnesium  sulfate bolus IVPB 3 g (08/13/23 0901)  piperacillin -tazobactam (ZOSYN )  IV 3.375 g (08/13/23 0835)   potassium chloride      potassium PHOSPHATE  IVPB (in mmol) 30 mmol (08/13/23 0913)   TPN (CLINIMIX-E) Adult 46 mL/hr at 08/12/23 1800   PRN Meds:.albuterol , hydrALAZINE , HYDROmorphone  (DILAUDID ) injection, metoprolol  tartrate, ondansetron  **OR** ondansetron  (ZOFRAN ) IV, mouth rinse, oxyCODONE , phenol, sodium chloride  flush   I have personally reviewed following labs and imaging studies  LABORATORY DATA: CBC: Recent Labs  Lab 08/07/23 0610 08/08/23 0831 08/09/23 0546 08/10/23 0554 08/12/23 0401  WBC 11.9* 11.1* 9.9 7.7 6.7  HGB 9.2* 9.1* 8.9* 8.7* 9.0*  HCT 28.0* 27.5* 27.3* 26.6* 27.1*  MCV  87.0 87.3 87.2 87.5 88.0  PLT 491* 597* 588* 701* 787*    Basic Metabolic Panel: Recent Labs  Lab 08/08/23 0831 08/09/23 0546 08/10/23 0554 08/11/23 0505 08/12/23 0401 08/12/23 0500 08/13/23 0325  NA 138 136 134* 135 136  --  138  K 3.2* 4.2 3.5 3.4* 4.1  --  2.8*  CL 98 98 95* 97* 95*  --  98  CO2 24 24 26 24 30   --  31  GLUCOSE 72 88 84 86 74  --  137*  BUN 15 13 16 17 15   --  17  CREATININE 0.70 0.60 0.56 0.59 0.81  --  0.50  CALCIUM  7.6* 7.7* 7.8* 7.8* 8.0*  --  7.3*  MG 1.9 2.0 1.6* 1.9 1.7  --  1.7  PHOS 3.0 2.8 3.2  --   --  3.0 2.1*    GFR: Estimated Creatinine Clearance: 56.5 mL/min (by C-G formula based on SCr of 0.5 mg/dL).  Liver Function Tests: Recent Labs  Lab 08/10/23 0554 08/11/23 0505  AST 17 13*  ALT 12 11  ALKPHOS 75 75  BILITOT 0.8 1.2  PROT 5.0* 5.2*  ALBUMIN  1.6* 1.7*   No results for input(s): LIPASE, AMYLASE in the last 168 hours. No results for input(s): AMMONIA in the last 168 hours.  Coagulation Profile: Recent Labs  Lab 08/07/23 0909  INR 1.2    Cardiac Enzymes: No results for input(s): CKTOTAL, CKMB, CKMBINDEX, TROPONINI in the last 168 hours.  BNP (last 3 results) No results for input(s): PROBNP in the last 8760 hours.  Lipid Profile: No results for input(s): CHOL, HDL, LDLCALC, TRIG, CHOLHDL, LDLDIRECT in the last 72 hours.  Thyroid  Function Tests: No results for input(s): TSH, T4TOTAL, FREET4, T3FREE, THYROIDAB in the last 72 hours.  Anemia Panel: No results for input(s): VITAMINB12, FOLATE, FERRITIN, TIBC, IRON, RETICCTPCT in the last 72 hours.  Urine analysis:    Component Value Date/Time   COLORURINE STRAW (A) 08/06/2023 1123   APPEARANCEUR CLEAR 08/06/2023 1123   LABSPEC 1.009 08/06/2023 1123   PHURINE 6.0 08/06/2023 1123   GLUCOSEU NEGATIVE 08/06/2023 1123   HGBUR NEGATIVE 08/06/2023 1123   BILIRUBINUR NEGATIVE 08/06/2023 1123   KETONESUR 5 (A) 08/06/2023  1123   PROTEINUR NEGATIVE 08/06/2023 1123   NITRITE NEGATIVE 08/06/2023 1123   LEUKOCYTESUR NEGATIVE 08/06/2023 1123    Sepsis Labs: Lactic Acid, Venous    Component Value Date/Time   LATICACIDVEN 1.8 07/30/2023 1213    MICROBIOLOGY: Recent Results (from the past 240 hours)  Aerobic/Anaerobic Culture w Gram Stain (surgical/deep wound)     Status: None   Collection Time: 08/07/23  1:41 PM   Specimen: Abscess  Result Value Ref Range Status   Specimen Description ABSCESS  Final   Special Requests NONE  Final   Gram Stain   Final    ABUNDANT  WBC PRESENT, PREDOMINANTLY PMN ABUNDANT GRAM NEGATIVE RODS ABUNDANT GRAM POSITIVE COCCI Performed at [facility], [address] [place], [place] [address]    Culture   Final    ABUNDANT MULTIPLE ORGANISMS PRESENT, NONE PREDOMINANT MIXED ANAEROBIC FLORA PRESENT.  CALL LAB IF FURTHER IID REQUIRED.    Report Status 08/09/2023 FINAL  Final    RADIOLOGY STUDIES/RESULTS: CT ABDOMEN PELVIS W CONTRAST Result Date: 08/12/2023 CLINICAL DATA:  Postop abdominal pain. Patient underwent exploratory laparotomy with Hartmann's procedure for stercoral colitis and perforation. Subsequent pelvic percutaneous drain placement for abscess. EXAM: CT ABDOMEN AND PELVIS WITH CONTRAST TECHNIQUE: Multidetector CT imaging of the abdomen and pelvis was performed using the standard protocol following bolus administration of intravenous contrast. RADIATION DOSE REDUCTION: This exam was performed according to the departmental dose-optimization program which includes automated exposure control, adjustment of the mA and/or kV according to patient size and/or use of iterative reconstruction technique. CONTRAST:  75mL OMNIPAQUE  IOHEXOL  350 MG/ML SOLN COMPARISON:  Abdominopelvic CT 08/06/2023. FINDINGS: Lower chest: Trace left pleural effusion and mild bibasilar atelectasis again noted, similar to previous CT. There is atherosclerosis of the aorta and coronary arteries.  Hepatobiliary: The liver is normal in density without suspicious focal abnormality. Stable hepatic cysts. No evidence of gallstones, gallbladder wall thickening or biliary dilatation. Pancreas: Unremarkable. No pancreatic ductal dilatation or surrounding inflammatory changes. Spleen: Normal in size without focal abnormality. Adrenals/Urinary Tract: Both adrenal glands appear normal. There is a persistent partially obstructing calculus in the distal left ureter, measuring 11 mm on coronal image 65/7. Resulting hydronephrosis and hydroureter with delayed contrast excretion. No evidence of right ureteral obstruction. There are small renal cysts bilaterally for which no specific follow-up imaging is recommended. The bladder appears unremarkable for its degree of distention. Stomach/Bowel: No enteric contrast administered. Nasogastric tube extends into the mid stomach. The stomach is decompressed. Multiple fluid-filled loops of dilated small bowel are again noted, without focal transition point or significant wall thickening. The appendix is not clearly visualized, although no pericecal inflammatory changes are identified. Colonic stool burden has improved compared with the previous study. The colon is relatively decompressed proximal to the sigmoid colostomy. The rectal pouch is fluid-filled and mildly distended. Vascular/Lymphatic: There are no enlarged abdominal or pelvic lymph nodes. Aortic and branch vessel atherosclerosis without evidence of aneurysm or large vessel occlusion. The portal, superior mesenteric and splenic veins are patent. Reproductive: The uterus and ovaries appear unremarkable. No evidence of adnexal mass. Other: Open midline anterior abdominal wall incision. New right transgluteal percutaneous drain appears appropriately position within the previously demonstrated complex pelvic fluid collection. However, this collection remains sizable and has slightly enlarged compared with the previous study,  measuring 8.0 x 6.7 cm on image 65/4. This collection likely communicates with an adjacent collection along the left pelvic sidewall, measuring 7.0 x 2.9 cm on image 68/4 and abuts the rectal pouch suture line. There are other less well-defined but enlarging peritoneal fluid collections with associated air bubbles and peritoneal enhancement, highly suspicious for peritonitis. A component inferior to the cecum measures up to 8.0 x 6.0 cm on image 58/4, and there is a component inferior to the colostomy measuring 7.4 x 5.3 cm on image 52/4. Musculoskeletal: No acute or significant osseous findings. Multilevel spondylosis. No evidence of discitis or osteomyelitis. IMPRESSION: 1. New right transgluteal percutaneous drain appears appropriately positioned within the previously demonstrated complex pelvic fluid collection. However, this collection remains sizable and has slightly enlarged compared with the previous study. This  abuts the rectal suture line and a leak in this area cannot be excluded. 2. There are other less well-defined but enlarging peritoneal fluid collections with associated air bubbles and peritoneal enhancement, highly suspicious for peritonitis. 3. Persistent partially obstructing 11 mm calculus in the distal left ureter with resulting hydronephrosis and hydroureter. 4. Persistent fluid-filled loops of dilated small bowel without focal transition point, likely ileus. 5. No other significant parenchymal findings. 6.  Aortic Atherosclerosis (ICD10-I70.0). 7. These results will be called to the ordering clinician or representative by the Radiologist Assistant, and communication documented in the PACS or [employer]. Electronically Signed   By: Elsie Perone M.D.   On: 08/12/2023 19:06   US  EKG SITE RITE Result Date: 08/12/2023 If Site Rite image not attached, placement could not be confirmed due to current cardiac rhythm.  DG Abd 2 Views Result Date: 08/12/2023 CLINICAL DATA:  Follow-up ileus  EXAM: ABDOMEN - 2 VIEW COMPARISON:  08/11/2023 FINDINGS: Enteric tube tip and side port are within the gastric fundus. Percutaneous drainage catheter enters from a right lower quadrant approach and terminates in the right hemipelvis. Persistent dilated loops of small bowel within the left hemiabdomen measure up to 4.3 cm, similar to previous exam. No signs of pneumoperitoneum. IMPRESSION: Persistent dilated loops of small bowel within the left hemiabdomen, similar to previous exam. Electronically Signed   By: Waddell Calk M.D.   On: 08/12/2023 09:17   DG Abd 1 View Result Date: 08/11/2023 CLINICAL DATA:  NG tube placement EXAM: ABDOMEN - 1 VIEW COMPARISON:  08/11/2023 FINDINGS: NG tube tip is in the fundus of the stomach. Dilated small bowel loops. Gas within the colon, but the colon appears to be decompressed. No organomegaly or free air. IMPRESSION: NG tube tip in the stomach. Gaseous distension of multiple small bowel loops, similar to prior study. Electronically Signed   By: Franky Crease M.D.   On: 08/11/2023 18:41   DG Abd 1 View Result Date: 08/11/2023 CLINICAL DATA:  Follow-up ileus EXAM: ABDOMEN - 1 VIEW COMPARISON:  08/06/2023 radiograph and CT FINDINGS: Few air dilated loops of small bowel measuring up to 4 cm with scattered colon gas. Left lower quadrant ostomy. Drainage catheter in the right pelvis. Decreased stool in the colon. IMPRESSION: Few air dilated loops of small bowel with scattered colon gas, suggesting ileus, findings do not appear greatly changed compared to scout image from the CT 08/06/2023. Electronically Signed   By: Luke Bun M.D.   On: 08/11/2023 14:15      LOS: 14 days   Donalda Applebaum, MD  [facility]    To contact the attending provider between 7A-7P or the covering provider during after hours 7P-7A, please log into the web site [email or web address] and access using universal [place] password for that web site. If you do not have the password, please call  the hospital operator.  08/13/2023, 9:50 AM

## 2023-08-13 NOTE — Plan of Care (Signed)
  Problem: Education: Goal: Knowledge of General Education information will improve Description: Including pain rating scale, medication(s)/side effects and non-pharmacologic comfort measures 08/13/2023 1858 by Rowland Hadassah RAMAN, RN Outcome: Progressing 08/13/2023 1858 by Rowland Hadassah RAMAN, RN Outcome: Progressing 08/13/2023 1858 by Rowland Hadassah RAMAN, RN Outcome: Progressing   Problem: Clinical Measurements: Goal: Ability to maintain clinical measurements within normal limits will improve 08/13/2023 1858 by Rowland Hadassah RAMAN, RN Outcome: Progressing 08/13/2023 1858 by Rowland Hadassah RAMAN, RN Outcome: Progressing 08/13/2023 1858 by Rowland Hadassah RAMAN, RN Outcome: Progressing Goal: Will remain free from infection 08/13/2023 1858 by Rowland Hadassah RAMAN, RN Outcome: Progressing 08/13/2023 1858 by Rowland Hadassah RAMAN, RN Outcome: Progressing 08/13/2023 1858 by Rowland Hadassah RAMAN, RN Outcome: Progressing Goal: Diagnostic test results will improve 08/13/2023 1858 by Rowland Hadassah RAMAN, RN Outcome: Progressing 08/13/2023 1858 by Rowland Hadassah RAMAN, RN Outcome: Progressing 08/13/2023 1858 by Rowland Hadassah RAMAN, RN Outcome: Progressing Goal: Respiratory complications will improve 08/13/2023 1858 by Rowland Hadassah RAMAN, RN Outcome: Progressing 08/13/2023 1858 by Rowland Hadassah RAMAN, RN Outcome: Progressing 08/13/2023 1858 by Rowland Hadassah RAMAN, RN Outcome: Progressing Goal: Cardiovascular complication will be avoided 08/13/2023 1858 by Rowland Hadassah RAMAN, RN Outcome: Progressing 08/13/2023 1858 by Rowland Hadassah RAMAN, RN Outcome: Progressing 08/13/2023 1858 by Rowland Hadassah RAMAN, RN Outcome: Progressing   Problem: Clinical Measurements: Goal: Will remain free from infection 08/13/2023 1858 by Rowland Hadassah RAMAN, RN Outcome: Progressing 08/13/2023 1858 by Rowland Hadassah RAMAN, RN Outcome: Progressing 08/13/2023 1858 by Rowland Hadassah RAMAN, RN Outcome: Progressing   Problem: Clinical Measurements: Goal: Respiratory complications will improve 08/13/2023 1858 by  Rowland Hadassah RAMAN, RN Outcome: Progressing 08/13/2023 1858 by Rowland Hadassah RAMAN, RN Outcome: Progressing 08/13/2023 1858 by Rowland Hadassah RAMAN, RN Outcome: Progressing   Problem: Clinical Measurements: Goal: Diagnostic test results will improve 08/13/2023 1858 by Rowland Hadassah RAMAN, RN Outcome: Progressing 08/13/2023 1858 by Rowland Hadassah RAMAN, RN Outcome: Progressing 08/13/2023 1858 by Rowland Hadassah RAMAN, RN Outcome: Progressing   Problem: Clinical Measurements: Goal: Cardiovascular complication will be avoided 08/13/2023 1858 by Rowland Hadassah RAMAN, RN Outcome: Progressing 08/13/2023 1858 by Rowland Hadassah RAMAN, RN Outcome: Progressing 08/13/2023 1858 by Rowland Hadassah RAMAN, RN Outcome: Progressing   Problem: Activity: Goal: Risk for activity intolerance will decrease 08/13/2023 1858 by Rowland Hadassah RAMAN, RN Outcome: Progressing 08/13/2023 1858 by Rowland Hadassah RAMAN, RN Outcome: Progressing 08/13/2023 1858 by Rowland Hadassah RAMAN, RN Outcome: Progressing   Problem: Nutrition: Goal: Adequate nutrition will be maintained 08/13/2023 1858 by Rowland Hadassah RAMAN, RN Outcome: Progressing 08/13/2023 1858 by Rowland Hadassah RAMAN, RN Outcome: Progressing 08/13/2023 1858 by Rowland Hadassah RAMAN, RN Outcome: Progressing   Problem: Coping: Goal: Level of anxiety will decrease 08/13/2023 1858 by Rowland Hadassah RAMAN, RN Outcome: Progressing 08/13/2023 1858 by Rowland Hadassah RAMAN, RN Outcome: Progressing 08/13/2023 1858 by Rowland Hadassah RAMAN, RN Outcome: Progressing   Problem: Elimination: Goal: Will not experience complications related to bowel motility 08/13/2023 1858 by Rowland Hadassah RAMAN, RN Outcome: Progressing 08/13/2023 1858 by Rowland Hadassah RAMAN, RN Outcome: Progressing 08/13/2023 1858 by Rowland Hadassah RAMAN, RN Outcome: Progressing Goal: Will not experience complications related to urinary retention 08/13/2023 1858 by Rowland Hadassah RAMAN, RN Outcome: Progressing 08/13/2023 1858 by Rowland Hadassah RAMAN, RN Outcome: Progressing 08/13/2023 1858 by Rowland Hadassah RAMAN, RN Outcome: Progressing

## 2023-08-13 NOTE — Plan of Care (Signed)
 Patient did not get enough sleep last night due to the NG tube and received pain medication throughout the night. Patient drainage from the NG tube is still bile with sediments in it.

## 2023-08-14 ENCOUNTER — Inpatient Hospital Stay (HOSPITAL_COMMUNITY)

## 2023-08-14 DIAGNOSIS — N1330 Unspecified hydronephrosis: Secondary | ICD-10-CM | POA: Diagnosis not present

## 2023-08-14 DIAGNOSIS — N201 Calculus of ureter: Secondary | ICD-10-CM | POA: Diagnosis not present

## 2023-08-14 DIAGNOSIS — I10 Essential (primary) hypertension: Secondary | ICD-10-CM | POA: Diagnosis not present

## 2023-08-14 DIAGNOSIS — K631 Perforation of intestine (nontraumatic): Secondary | ICD-10-CM | POA: Diagnosis not present

## 2023-08-14 DIAGNOSIS — E43 Unspecified severe protein-calorie malnutrition: Secondary | ICD-10-CM | POA: Insufficient documentation

## 2023-08-14 LAB — COMPREHENSIVE METABOLIC PANEL WITH GFR
ALT: 11 U/L (ref 0–44)
AST: 13 U/L — ABNORMAL LOW (ref 15–41)
Albumin: <1.5 g/dL — ABNORMAL LOW (ref 3.5–5.0)
Alkaline Phosphatase: 54 U/L (ref 38–126)
Anion gap: 7 (ref 5–15)
BUN: 17 mg/dL (ref 8–23)
CO2: 30 mmol/L (ref 22–32)
Calcium: 7.5 mg/dL — ABNORMAL LOW (ref 8.9–10.3)
Chloride: 100 mmol/L (ref 98–111)
Creatinine, Ser: 0.41 mg/dL — ABNORMAL LOW (ref 0.44–1.00)
GFR, Estimated: >60 mL/min (ref >60)
Glucose, Bld: 144 mg/dL — ABNORMAL HIGH (ref 70–99)
Potassium: 3.3 mmol/L — ABNORMAL LOW (ref 3.5–5.1)
Sodium: 137 mmol/L (ref 135–145)
Total Bilirubin: 0.3 mg/dL (ref 0.0–1.2)
Total Protein: 4.8 g/dL — ABNORMAL LOW (ref 6.5–8.1)

## 2023-08-14 LAB — GLUCOSE, CAPILLARY
Glucose-Capillary: 111 mg/dL — ABNORMAL HIGH (ref 70–99)
Glucose-Capillary: 129 mg/dL — ABNORMAL HIGH (ref 70–99)
Glucose-Capillary: 131 mg/dL — ABNORMAL HIGH (ref 70–99)
Glucose-Capillary: 135 mg/dL — ABNORMAL HIGH (ref 70–99)
Glucose-Capillary: 136 mg/dL — ABNORMAL HIGH (ref 70–99)
Glucose-Capillary: 147 mg/dL — ABNORMAL HIGH (ref 70–99)

## 2023-08-14 LAB — CBC
HCT: 23.2 % — ABNORMAL LOW (ref 36.0–46.0)
Hemoglobin: 7.2 g/dL — ABNORMAL LOW (ref 12.0–15.0)
MCH: 28.3 pg (ref 26.0–34.0)
MCHC: 31 g/dL (ref 30.0–36.0)
MCV: 91.3 fL (ref 80.0–100.0)
Platelets: 639 K/uL — ABNORMAL HIGH (ref 150–400)
RBC: 2.54 MIL/uL — ABNORMAL LOW (ref 3.87–5.11)
RDW: 15.5 % (ref 11.5–15.5)
WBC: 5.9 K/uL (ref 4.0–10.5)
nRBC: 0 % (ref 0.0–0.2)

## 2023-08-14 LAB — MAGNESIUM: Magnesium: 1.9 mg/dL (ref 1.7–2.4)

## 2023-08-14 LAB — PHOSPHORUS: Phosphorus: 2 mg/dL — ABNORMAL LOW (ref 2.5–4.6)

## 2023-08-14 LAB — TRIGLYCERIDES: Triglycerides: 129 mg/dL (ref <150)

## 2023-08-14 MED ORDER — PANTOPRAZOLE SODIUM 40 MG IV SOLR
40.0000 mg | Freq: Two times a day (BID) | INTRAVENOUS | Status: DC
  Administered 2023-08-14 – 2023-09-01 (×37): 40 mg via INTRAVENOUS
  Filled 2023-08-14 (×37): qty 10

## 2023-08-14 MED ORDER — MAGNESIUM SULFATE 2 GM/50ML IV SOLN
2.0000 g | Freq: Once | INTRAVENOUS | Status: AC
  Administered 2023-08-14: 2 g via INTRAVENOUS
  Filled 2023-08-14: qty 50

## 2023-08-14 MED ORDER — MIDAZOLAM HCL 2 MG/2ML IJ SOLN
INTRAMUSCULAR | Status: AC | PRN
  Administered 2023-08-14: 1 mg via INTRAVENOUS
  Administered 2023-08-14: .5 mg via INTRAVENOUS
  Administered 2023-08-14: 1 mg via INTRAVENOUS
  Administered 2023-08-14 (×2): .5 mg via INTRAVENOUS

## 2023-08-14 MED ORDER — POTASSIUM CHLORIDE IN NACL 40-0.9 MEQ/L-% IV SOLN
INTRAVENOUS | Status: AC
  Filled 2023-08-14: qty 1000

## 2023-08-14 MED ORDER — FENTANYL CITRATE (PF) 100 MCG/2ML IJ SOLN
INTRAMUSCULAR | Status: AC
  Filled 2023-08-14: qty 4

## 2023-08-14 MED ORDER — ACETAMINOPHEN 500 MG PO TABS
1000.0000 mg | ORAL_TABLET | Freq: Four times a day (QID) | ORAL | Status: DC
  Administered 2023-08-14 – 2023-08-18 (×10): 1000 mg
  Filled 2023-08-14 (×11): qty 2

## 2023-08-14 MED ORDER — LIDOCAINE HCL (PF) 1 % IJ SOLN
30.0000 mL | Freq: Once | INTRAMUSCULAR | Status: AC
  Administered 2023-08-14: 30 mL

## 2023-08-14 MED ORDER — LIDOCAINE HCL 1 % IJ SOLN: 10.0000 mL | Freq: Once | INTRAMUSCULAR | Status: DC

## 2023-08-14 MED ORDER — DIPHENHYDRAMINE HCL 50 MG/ML IJ SOLN
12.5000 mg | Freq: Three times a day (TID) | INTRAMUSCULAR | Status: DC | PRN
  Administered 2023-08-14 – 2023-08-16 (×3): 12.5 mg via INTRAVENOUS
  Filled 2023-08-14 (×3): qty 1

## 2023-08-14 MED ORDER — MIDAZOLAM HCL 2 MG/2ML IJ SOLN
INTRAMUSCULAR | Status: AC
  Filled 2023-08-14: qty 4

## 2023-08-14 MED ORDER — TRAVASOL 10 % IV SOLN
INTRAVENOUS | Status: AC
  Filled 2023-08-14: qty 660

## 2023-08-14 MED ORDER — OXYCODONE HCL 5 MG PO TABS
5.0000 mg | ORAL_TABLET | ORAL | Status: DC | PRN
  Administered 2023-08-16 – 2023-08-18 (×3): 10 mg
  Filled 2023-08-14 (×3): qty 2

## 2023-08-14 MED ORDER — FENTANYL CITRATE (PF) 100 MCG/2ML IJ SOLN
INTRAMUSCULAR | Status: AC | PRN
  Administered 2023-08-14 (×5): 25 ug via INTRAVENOUS
  Administered 2023-08-14: 50 ug via INTRAVENOUS

## 2023-08-14 MED ORDER — POTASSIUM PHOSPHATES 15 MMOLE/5ML IV SOLN
45.0000 mmol | Freq: Once | INTRAVENOUS | Status: AC
  Administered 2023-08-14: 45 mmol via INTRAVENOUS
  Filled 2023-08-14: qty 15

## 2023-08-14 NOTE — Progress Notes (Addendum)
 PROGRESS NOTE        PATIENT DETAILS Name: Angela [name] Age: 73 y.o. Sex: female Date of Birth: 09-07-1950 Admit Date: 07/30/2023 Admitting Physician Maximino DELENA Sharps, MD ERE:[id number], [name], DO  Brief Summary: Patient is a 73 y.o.  female with history of HTN, HLD who presented with worsening abdominal pain-CT imaging showed intraperitoneal free air consistent with bowel perforation  Significant events: 6/21>> evaluated in the ED for abdominal pain-found to have left mid ureteral stone-9 mm with hydronephrosis-and large volume of stool-managed with supportive care and discharged home. 6/22>> worsening abdominal pain-repeat CT scan consistent with bowel perforation-surgery consult-admit to [facility]. 7/04>> vomiting-recurrent ileus-NG tube reinserted 7/05>> TNA started  Significant studies: 6/22>> CT renal stone study: Interval development of intraperitoneal free air-moderate volume free fluid-consistent with bowel perforation, persistent large volume stool with dilated sigmoid colon.  Similar location of left ureteral stone-9 mm. 6/29>> CT abdomen/pelvis: S/p partial colectomy-LLQ ostomy-complex free fluid with multiple foci of air-most pronounced in the surgical bed-possibility of superimposed infection or bowel leak cannot be excluded. 6/30>> echo: EF 60-65%. 7/05>> CT abdomen/pelvis: Previous collection with pigtail has increased in size, other less well-defined but enlarging peritoneal fluid collections.  Persistent-partially obstructing 11 mm calculus in the distal left ureter.  Persistent fluid loops of dilated small bowel-likely ileus.  Significant microbiology data: 6/22>> urine culture: No growth 6/22>> blood culture: No growth 6/30>> pelvic abscess culture: Gram-negative rods/gram-positive cocci-mixed anaerobic flora.  Procedures: 6/22>> sigmoidectomy with end colostomy. 6/30>> CT-guided drain and pelvic abscess by IR  Consults: General  Surgery IR Urology  Subjective: She could not sleep last night-due to pain in her stomach and also pain around her NG tube..  Objective: Vitals: Blood pressure (!) 146/67, pulse 93, temperature 98.4 F (36.9 C), temperature source Axillary, resp. rate (!) 23, height 5' 4 (1.626 m), weight 59.8 kg, SpO2 94%.   Exam: Awake/alert Chest: Clear to auscultation CVS: S1-S2 regular Abdomen: Slightly distended-soft-brown stools in ostomy bag. Extremities:+ Edema Neurology: Nonfocal.  Pertinent Labs/Radiology:    Latest Ref Rng & Units 08/14/2023    1:30 AM 08/12/2023    4:01 AM 08/10/2023    5:54 AM  CBC  WBC 4.0 - 10.5 K/uL 5.9  6.7  7.7   Hemoglobin 12.0 - 15.0 g/dL 7.2  9.0  8.7   Hematocrit 36.0 - 46.0 % 23.2  27.1  26.6   Platelets 150 - 400 K/uL 639  787  701     Lab Results  Component Value Date   NA 137 08/14/2023   K 3.3 (L) 08/14/2023   CL 100 08/14/2023   CO2 30 08/14/2023     Assessment/Plan: Sepsis secondary to stercoral colitis with ischemia/perforation of sigmoid colon with feculent peritonitis-s/p sigmoid colectomy/ostomy on 6/22-complicated by postoperative ileus and then residual pelvic abscess requiring CT-guided drain placement by on 6/30. Had improved-but unfortunately had a recurrence of ileus-requiring reinsertion of NG tube on 7/4-TNA started on 7/5 Remains on Zosyn  Repeat CT with enlarging fluid collections in the peritoneum General Surgery following-with plans to reconsult IR for possible more drain placement. Remains on Zosyn   Coffee-ground NG tube emesis PPI changed to IV twice daily Slight drop in hemoglobin-discussed with general surgery-repeat later today Holding Lovenox   Follow closely-if significant drop in hemoglobin or overt bleeding becomes apparent-will probably need GI consultation and PRBC transfusion.  Hydronephrosis secondary  to distal left ureteral stone Urology following-with recommendations for supportive care for now-once her  medical problems have stabilized-urology will arrange for lithotripsy versus stent placement. Continue to monitor renal function closely  Hospital delirium Secondary to acute illness/narcotics Completely awake/alert this morning Will need to be cautious with narcotics/other medications-but she has enlarging residual abscesses in her belly-and is requiring narcotics for pain control.  She is also n.p.o.-limited choices at this point. Delirium precautions  Normocytic anemia Due to acute illness Follow CBC-if significant drop-will transfuse  Thrombocytosis Likely reactive Supportive care  Hypomagnesemia/hypophosphatemia/hypokalemia Secondary to NG tube suctioning Replete per pharmacy protocol-as patient on TNA  HTN BP stable-all oral antihypertensives on hold due to n.p.o. status Continue with as needed IV hydralazine  and metoprolol .   Lower extremity edema Secondary to hypoalbuminemia/IV fluid resuscitation in the setting of acute illness Echo stable-UA negative for proteinuria Hold off on further furosemide  today given concerns for vomiting/ileus.  Chronic idiopathic constipation Bowel function recovering-brown stools Once more stable-will need to be placed on a bowel regimen.  Odynophagia secondary to oral/aphthous ulcers Improved on Valtrex and Magic mouthwash Supportive care  Glaucoma Timolol  eyedrops  Debility/deconditioning PT OT eval-Home health recommended.  Code status:   Code Status: Full Code   DVT Prophylaxis: Place TED hose Start: 08/09/23 1000 SCDs Start: 07/30/23 1954 SCDs Start: 07/30/23 1953   Family Communication: Multiple family members at bedside.   Disposition Plan: Status is: Inpatient Remains inpatient appropriate because: Severity of illness   Planned Discharge Destination:Home health   Diet: Diet Order             Diet NPO time specified  Diet effective now                     Antimicrobial agents: Anti-infectives  (From admission, onward)    Start     Dose/Rate Route Frequency Ordered Stop   08/06/23 1130  acyclovir  (ZOVIRAX ) 200 MG capsule 400 mg  Status:  Discontinued        400 mg Oral 3 times daily 08/06/23 1037 08/08/23 1424   08/06/23 1100  piperacillin -tazobactam (ZOSYN ) IVPB 3.375 g        3.375 g 12.5 mL/hr over 240 Minutes Intravenous Every 8 hours 08/06/23 1051     07/30/23 1800  piperacillin -tazobactam (ZOSYN ) IVPB 3.375 g        3.375 g 12.5 mL/hr over 240 Minutes Intravenous Every 8 hours 07/30/23 1332 08/04/23 2359   07/30/23 1400  piperacillin -tazobactam (ZOSYN ) IVPB 3.375 g  Status:  Discontinued        3.375 g 100 mL/hr over 30 Minutes Intravenous Every 8 hours 07/30/23 1329 07/30/23 1332   07/30/23 1015  piperacillin -tazobactam (ZOSYN ) IVPB 3.375 g        3.375 g 100 mL/hr over 30 Minutes Intravenous  Once 07/30/23 1013 07/30/23 1311        MEDICATIONS: Scheduled Meds:  acetaminophen   1,000 mg Per Tube Q6H   Chlorhexidine  Gluconate Cloth  6 each Topical Daily   insulin  aspart  0-6 Units Subcutaneous Q6H   latanoprost   1 drop Both Eyes QHS   magic mouthwash  5 mL Oral QID   methocarbamol   500 mg Oral TID   Or   methocarbamol  (ROBAXIN ) injection  500 mg Intravenous TID   pantoprazole  (PROTONIX ) IV  40 mg Intravenous Q12H   sodium chloride  flush  3 mL Intravenous Q12H   thiamine  (VITAMIN B1) injection  100 mg Intravenous Q24H   timolol   1  drop Both Eyes BID   Continuous Infusions:  0.9 % NaCl with KCl 40 mEq / L 75 mL/hr at 08/13/23 2259   0.9 % NaCl with KCl 40 mEq / L     magnesium  sulfate bolus IVPB 2 g (08/14/23 1016)   piperacillin -tazobactam (ZOSYN )  IV 3.375 g (08/14/23 1015)   potassium PHOSPHATE  IVPB (in mmol) 45 mmol (08/14/23 1010)   TPN (CLINIMIX-E) Adult 46 mL/hr at 08/13/23 1900   TPN ADULT (ION)     PRN Meds:.albuterol , hydrALAZINE , HYDROmorphone  (DILAUDID ) injection, melatonin, metoprolol  tartrate, ondansetron  **OR** ondansetron  (ZOFRAN ) IV, mouth  rinse, oxyCODONE , phenol, sodium chloride  flush   I have personally reviewed following labs and imaging studies  LABORATORY DATA: CBC: Recent Labs  Lab 08/08/23 0831 08/09/23 0546 08/10/23 0554 08/12/23 0401 08/14/23 0130  WBC 11.1* 9.9 7.7 6.7 5.9  HGB 9.1* 8.9* 8.7* 9.0* 7.2*  HCT 27.5* 27.3* 26.6* 27.1* 23.2*  MCV 87.3 87.2 87.5 88.0 91.3  PLT 597* 588* 701* 787* 639*    Basic Metabolic Panel: Recent Labs  Lab 08/09/23 0546 08/10/23 0554 08/11/23 0505 08/12/23 0401 08/12/23 0500 08/13/23 0325 08/14/23 0130  NA 136 134* 135 136  --  138 137  K 4.2 3.5 3.4* 4.1  --  2.8* 3.3*  CL 98 95* 97* 95*  --  98 100  CO2 24 26 24 30   --  31 30  GLUCOSE 88 84 86 74  --  137* 144*  BUN 13 16 17 15   --  17 17  CREATININE 0.60 0.56 0.59 0.81  --  0.50 0.41*  CALCIUM  7.7* 7.8* 7.8* 8.0*  --  7.3* 7.5*  MG 2.0 1.6* 1.9 1.7  --  1.7 1.9  PHOS 2.8 3.2  --   --  3.0 2.1* 2.0*    GFR: Estimated Creatinine Clearance: 56.5 mL/min (A) (by C-G formula based on SCr of 0.41 mg/dL (L)).  Liver Function Tests: Recent Labs  Lab 08/10/23 0554 08/11/23 0505 08/14/23 0130  AST 17 13* 13*  ALT 12 11 11   ALKPHOS 75 75 54  BILITOT 0.8 1.2 0.3  PROT 5.0* 5.2* 4.8*  ALBUMIN  1.6* 1.7* <1.5*   No results for input(s): LIPASE, AMYLASE in the last 168 hours. No results for input(s): AMMONIA in the last 168 hours.  Coagulation Profile: No results for input(s): INR, PROTIME in the last 168 hours.   Cardiac Enzymes: No results for input(s): CKTOTAL, CKMB, CKMBINDEX, TROPONINI in the last 168 hours.  BNP (last 3 results) No results for input(s): PROBNP in the last 8760 hours.  Lipid Profile: Recent Labs    08/14/23 0131  TRIG 129    Thyroid  Function Tests: No results for input(s): TSH, T4TOTAL, FREET4, T3FREE, THYROIDAB in the last 72 hours.  Anemia Panel: No results for input(s): VITAMINB12, FOLATE, FERRITIN, TIBC, IRON, RETICCTPCT in  the last 72 hours.  Urine analysis:    Component Value Date/Time   COLORURINE STRAW (A) 08/06/2023 1123   APPEARANCEUR CLEAR 08/06/2023 1123   LABSPEC 1.009 08/06/2023 1123   PHURINE 6.0 08/06/2023 1123   GLUCOSEU NEGATIVE 08/06/2023 1123   HGBUR NEGATIVE 08/06/2023 1123   BILIRUBINUR NEGATIVE 08/06/2023 1123   KETONESUR 5 (A) 08/06/2023 1123   PROTEINUR NEGATIVE 08/06/2023 1123   NITRITE NEGATIVE 08/06/2023 1123   LEUKOCYTESUR NEGATIVE 08/06/2023 1123    Sepsis Labs: Lactic Acid, Venous    Component Value Date/Time   LATICACIDVEN 1.8 07/30/2023 1213    MICROBIOLOGY: Recent Results (from the past 240  hours)  Aerobic/Anaerobic Culture w Gram Stain (surgical/deep wound)     Status: None   Collection Time: 08/07/23  1:41 PM   Specimen: Abscess  Result Value Ref Range Status   Specimen Description ABSCESS  Final   Special Requests NONE  Final   Gram Stain   Final    ABUNDANT WBC PRESENT, PREDOMINANTLY PMN ABUNDANT GRAM NEGATIVE RODS ABUNDANT GRAM POSITIVE COCCI Performed at [facility], [address] [place], [place] [address]    Culture   Final    ABUNDANT MULTIPLE ORGANISMS PRESENT, NONE PREDOMINANT MIXED ANAEROBIC FLORA PRESENT.  CALL LAB IF FURTHER IID REQUIRED.    Report Status 08/09/2023 FINAL  Final    RADIOLOGY STUDIES/RESULTS: CT ABDOMEN PELVIS W CONTRAST Result Date: 08/12/2023 CLINICAL DATA:  Postop abdominal pain. Patient underwent exploratory laparotomy with Hartmann's procedure for stercoral colitis and perforation. Subsequent pelvic percutaneous drain placement for abscess. EXAM: CT ABDOMEN AND PELVIS WITH CONTRAST TECHNIQUE: Multidetector CT imaging of the abdomen and pelvis was performed using the standard protocol following bolus administration of intravenous contrast. RADIATION DOSE REDUCTION: This exam was performed according to the departmental dose-optimization program which includes automated exposure control, adjustment of the mA and/or kV  according to patient size and/or use of iterative reconstruction technique. CONTRAST:  75mL OMNIPAQUE  IOHEXOL  350 MG/ML SOLN COMPARISON:  Abdominopelvic CT 08/06/2023. FINDINGS: Lower chest: Trace left pleural effusion and mild bibasilar atelectasis again noted, similar to previous CT. There is atherosclerosis of the aorta and coronary arteries. Hepatobiliary: The liver is normal in density without suspicious focal abnormality. Stable hepatic cysts. No evidence of gallstones, gallbladder wall thickening or biliary dilatation. Pancreas: Unremarkable. No pancreatic ductal dilatation or surrounding inflammatory changes. Spleen: Normal in size without focal abnormality. Adrenals/Urinary Tract: Both adrenal glands appear normal. There is a persistent partially obstructing calculus in the distal left ureter, measuring 11 mm on coronal image 65/7. Resulting hydronephrosis and hydroureter with delayed contrast excretion. No evidence of right ureteral obstruction. There are small renal cysts bilaterally for which no specific follow-up imaging is recommended. The bladder appears unremarkable for its degree of distention. Stomach/Bowel: No enteric contrast administered. Nasogastric tube extends into the mid stomach. The stomach is decompressed. Multiple fluid-filled loops of dilated small bowel are again noted, without focal transition point or significant wall thickening. The appendix is not clearly visualized, although no pericecal inflammatory changes are identified. Colonic stool burden has improved compared with the previous study. The colon is relatively decompressed proximal to the sigmoid colostomy. The rectal pouch is fluid-filled and mildly distended. Vascular/Lymphatic: There are no enlarged abdominal or pelvic lymph nodes. Aortic and branch vessel atherosclerosis without evidence of aneurysm or large vessel occlusion. The portal, superior mesenteric and splenic veins are patent. Reproductive: The uterus and ovaries  appear unremarkable. No evidence of adnexal mass. Other: Open midline anterior abdominal wall incision. New right transgluteal percutaneous drain appears appropriately position within the previously demonstrated complex pelvic fluid collection. However, this collection remains sizable and has slightly enlarged compared with the previous study, measuring 8.0 x 6.7 cm on image 65/4. This collection likely communicates with an adjacent collection along the left pelvic sidewall, measuring 7.0 x 2.9 cm on image 68/4 and abuts the rectal pouch suture line. There are other less well-defined but enlarging peritoneal fluid collections with associated air bubbles and peritoneal enhancement, highly suspicious for peritonitis. A component inferior to the cecum measures up to 8.0 x 6.0 cm on image 58/4, and there is a component inferior to  the colostomy measuring 7.4 x 5.3 cm on image 52/4. Musculoskeletal: No acute or significant osseous findings. Multilevel spondylosis. No evidence of discitis or osteomyelitis. IMPRESSION: 1. New right transgluteal percutaneous drain appears appropriately positioned within the previously demonstrated complex pelvic fluid collection. However, this collection remains sizable and has slightly enlarged compared with the previous study. This abuts the rectal suture line and a leak in this area cannot be excluded. 2. There are other less well-defined but enlarging peritoneal fluid collections with associated air bubbles and peritoneal enhancement, highly suspicious for peritonitis. 3. Persistent partially obstructing 11 mm calculus in the distal left ureter with resulting hydronephrosis and hydroureter. 4. Persistent fluid-filled loops of dilated small bowel without focal transition point, likely ileus. 5. No other significant parenchymal findings. 6.  Aortic Atherosclerosis (ICD10-I70.0). 7. These results will be called to the ordering clinician or representative by the Radiologist Assistant, and  communication documented in the PACS or [employer]. Electronically Signed   By: Elsie Perone M.D.   On: 08/12/2023 19:06      LOS: 15 days   Donalda Applebaum, MD  [facility]    To contact the attending provider between 7A-7P or the covering provider during after hours 7P-7A, please log into the web site [email or web address] and access using universal [place] password for that web site. If you do not have the password, please call the hospital operator.  08/14/2023, 11:14 AM

## 2023-08-14 NOTE — Procedures (Signed)
 Interventional Radiology Procedure:   Indications: Post operative abscesses.  Existing transgluteal drain is malpositioned  Procedure: CT guided placement of RLQ drain and right transgluteal pelvic drain  Findings: 12 Fr drain placed in RLQ and aspirated 400 ml of tan colored fluid.  Old transgluteal drain was removed.  New 12 Fr right transgluteal drain placed and aspirated 100 ml of tan fluid.  Complications: None     EBL: Minimal  Plan: Follow drain outputs  Leoda Smithhart R. Philip, MD  Pager: [phone]

## 2023-08-14 NOTE — Progress Notes (Signed)
 PHARMACY - TOTAL PARENTERAL NUTRITION CONSULT NOTE   Indication: intolerance to feeding, recurrent ileus  Patient Measurements: Height: 5' 4 (162.6 cm) Weight: 59.8 kg (131 lb 13.4 oz) IBW/kg (Calculated) : 54.7 TPN AdjBW (KG): 57.2 Body mass index is 22.63 kg/m. Usual Weight: ~59 kg  Assessment:  73 y.o. F s/p exp lap with Hartmann's procedure for stercoral colitis with ischemia and perforation with feculent peritonitis on 6/22. Noted pt has been NPO/clear liquid/full liquid diet since after surgery with limited intake. Pt now with ileus on Xray (dilated loops of small bowel) and N/V with liquid diet so NGT replaced 7/4, now NPO and starting TPN. Pt at risk of refeeding with prolonged period of little po intake since admission.  Glucose / Insulin : No h/o DM. CBGs <150. No SSI given. Electrolytes: K 3.3 (goal >4 with ileus), Mg 1.9 (goal >2 with ileus), Phos 2, Corr Ca at least 9.5 Renal: Scr <1, BUN wnl. Hepatic: LFTs wnl. Alb <1.5. Intake / Output; MIVF: NGT o/p 1350. Colostomy o/p 50. UOP 1300 ml yday (?all of urine charted).  IR drain 5 ml. MIVF: NS with 40mEq K/L at 75 ml/hr (order also for NS at 75ml/hr but confirmed not currently hanging). GI Imaging: 7/5 abd xray - persistent dilated loops of small bowel GI Surgeries / Procedures:  6/22 exp lap with Hartmann's procedure for stercoral colitis with ischemia and perforation with feculent peritonitis  Central access: PICC to be placed 7/5 TPN start date: 7/5  Nutritional Goals: Goal TPN (AA 50g/L, dextrose  15%, lipids 25g/L) at 75 ml/hr will provide 1728 kcal and 90 gm protein.  RD Assessment: Estimated Needs Total Energy Estimated Needs: 1650-1850 kcals Total Protein Estimated Needs: 80-90 g Total Fluid Estimated Needs: >/= 1.7 L  Current Nutrition:  NPO TPN  Plan:  Change to compounded TPN at 55 ml/hr to provide (meeting estimated needs). Will advance to goal tomorrow as tolerated. Electrolytes in TPN: increase Na  to 75mEq/L, increase K to 65mEq/L, increase Ca to 47mEq/L, increase Mg to 7 mEq/L, and increase Phos to 25 mmol/L (max in TPN). Cl:Ac 1:1 Kphos 45 mmol IVPB Decrease MIVF (NS with 40mEq K/L) to 40ml/hr when new TPN hangs. Add standard MVI and trace elements to TPN Continue very Sensitive q6h SSI and adjust as needed  Thiamine  100mg  IV q24h outside of TPN x 6 days (7/5-7/10) Monitor TPN labs on Mon/Thurs, daily until stable  Vito Ralph, PharmD, BCPS Please see amion for complete clinical pharmacist phone list 08/14/2023,8:14 AM

## 2023-08-14 NOTE — Consult Note (Addendum)
 WOC Nurse ostomy follow up Stoma type/location: LLQ colostomy  Stomal assessment/size:  1 3/4 inch, round, red, moist, os at center Peristomal assessment: intact Treatment options for stomal/peristomal skin:  2 inch barrier ring Output brown stool Ostomy pouching: 2pc.flat with barrier ring  Education provided: Spouse at bedside, able to demonstrate removal of pouch, cutting of skin barrier, application of barrier ring, skin barrier, and pouch.  Spouse able to verbalize need to change 2x weekly and frequency of emptying. [employer] catalog marked with needed supplies on previous visit. Enrolled patient in [place] Secure Start Discharge program: Yes  WOC team will continue to follow for ostomy teaching.  Doyal Polite, RN, MSN, [facility] WOC Team

## 2023-08-14 NOTE — Progress Notes (Addendum)
 Nutrition Follow-up  DOCUMENTATION CODES:   Severe malnutrition in context of acute illness/injury  INTERVENTION:   Monitor for diet advancement and tolerance  HIGH refeeding risk due to NPO/CL/FL diet with poor tolerance of po x 13 days   TPN to meet estimated nutritional needs; TPN order per Pharmacy IV Thiamine  100 mg for at least 5 days Monitor magnesium , potassium, and phosphorus daily for at least 3 days, MD to replete as needed, as pt is at risk for refeeding syndrome  Daily weights on TPN  NUTRITION DIAGNOSIS:  Severe Malnutrition related to acute illness (stercoral colitis w/ ischemia and performation w/ feculent peritonitis) as evidenced by mild fat depletion, moderate fat depletion. - new dx established s/p NFPE  GOAL:  Patient will meet greater than or equal to 90% of their needs  MONITOR:  TF tolerance, I & O's, Diet advancement, Labs  REASON FOR ASSESSMENT:   Consult New TPN/TNA  ASSESSMENT:   73 yo female with stercoral colitis admitted on 6/22 with ischemia and perforation with feculent peritonitis requiring sigmoidectomy with end colostomy. Pt seen in ED on 6/21 with CT indicating colitis with large stool burden-discharged on antibiotics. Post-op course complicated by pelvic abscess and post-op ileus, inability to tolerate po. PMH includes chronic idiopathic constipation, HLD, HTN  6/22 Admitted, Ex Lap, open sigmoid colectomy with colostomy for stercoral colitis with ischemia and perforation with feculent peritonitis 6/23 NPO with ice chips 6/25 CL diet initiated 6/28 Advanced to [facility] diet 6/29 Back to NPO, CT A/P: complex free fluid with multiple foci of air, possible infection or bowel leak 6/30 CT-guided drainage of pelvic abscess by IR, drain left in place, ECHO EF 60-65% 7/01 Started back on CL diet 7/02 Advanced to [facility] Diet 7/03 Diet downgraded to CL  due to abdominal distention 7/04 Not tolerating CL diet, NG to LIS, NPO 7/05 Abd xray with persistent  dilated loops of small bowel within the left hemiabdomen measure up to 4.3 cm. PICC line placement with TPN initiation 7/07 IR consulted to eval for additional drain placement  Patient seen at bedside with sister present. Pleasant and conversant. Willing to engage with RD for nutrition interview. TPN infusing. Observed with coffee ground appearing NGT output.   Per CT scan yesterday, appears R transgluteal percutaneous is appropriately positioned, however pelvic fluid collection has slightly enlarged compared to previous study. Other less well-defined, but enlarging peritoneal fluid collections also present and suspicious for peritonitis. Also notable for likely ileus. Attending re-consulting IR to eval for additional drain.   Electrolytes being repleted 2/2 refeeding and TPN being advanced to goal rate tomorrow.   24 Hour Recall B: Boost supplement L: sandwich w/ fruit and chips  D: protein, starch, veg  Incorporates yogurt and dairy in her diet as well. Reports reduced intake leading up to admission. She and her husband had a case of what she thinks was Norovirus back in March of this year and attributes this to weight loss and reduced intake. No difficulty chewing or swallowing at baseline. Adequate dentition observed.   Admit Weight: 57.2kg - appears pulled forward from previous admission Current Weight: 59.8kg +moderate edema to BLEs  Endorses UBW of 135-140lbs, however this is not reflected in her weight hx, per chart review. Has shown 9.3% weight loss in last year and 2.1% in last six months. Neither of these is considered clinically significant for the time frame reviewed. Even reported weight loss of 6.6% in last three months is considered not clinicalyl significant, although concerning.  Per Of note, true body weight likely lower than what is reported currently as she is with  moderate edema on exam and abdomen distended. She does endorse constipation at baseline.   Drains/Lines: NGT  (gastric): x24 hours R basilic: PICC (placed 7/05) LLQ: colostomy: 50ml x24 hours R buttock: JP drain (12 Fr): none documented UOP: x24 hours   Labs: Sodium 137 (wdl)  Potassium 4.1>2.8>3.3 (L) BUN/Creatinine wdl Magnesium  1.9 (wdl) Phosphorus 3.0>2.1>2.0 (L) Hgb 8.7>9.0>7.2 (L) CBGs 137-144 x24 hours   Meds:  Protonix  Thiamine  SS novolog   Drips: 40mEq x1 45 mmol potassium PHOS x1 IV ABX  NUTRITION - FOCUSED PHYSICAL EXAM:  Flowsheet Row Most Recent Value  Orbital Region Mild depletion  Upper Arm Region Moderate depletion  Thoracic and Lumbar Region Unable to assess  Buccal Region Moderate depletion  Temple Region Moderate depletion  Clavicle Bone Region Moderate depletion  Clavicle and Acromion Bone Region Moderate depletion  Scapular Bone Region Moderate depletion  Dorsal Hand No depletion  Patellar Region No depletion  Anterior Thigh Region Mild depletion  Posterior Calf Region Unable to assess  [edema]  Edema (RD Assessment) Moderate  Hair Reviewed  Eyes Reviewed  Mouth Reviewed  Skin Reviewed  Nails Reviewed     Diet Order:   Diet Order             Diet NPO time specified  Diet effective now            EDUCATION NEEDS:  Education needs have been addressed  Skin:  Skin Assessment: Skin Integrity Issues: Skin Integrity Issues:: Incisions Incisions: abdomen (closed)  Last BM:  7/6 via colostomy  Height:  Ht Readings from Last 1 Encounters:  07/30/23 5' 4 (1.626 m)   Weight:  Wt Readings from Last 1 Encounters:  08/14/23 59.8 kg   Ideal Body Weight:  54.55 kg  BMI:  Body mass index is 22.63 kg/m.  Estimated Nutritional Needs:   Kcal:  1650-1850 kcals  Protein:  80-90 g  Fluid:  >/= 1.7 L  Blair Deaner MS, RD, LDN Registered Dietitian Clinical Nutrition RD Inpatient Contact Info in Amion

## 2023-08-14 NOTE — Consult Note (Signed)
 Chief Complaint: Pelvic fluid collection  Referring Provider(s): Dr. Richerd Silversmith  Supervising Physician: Philip Cornet  Patient Status: [facility] - In-pt  History of Present Illness: Angela [name] is a 73 y.o. female with a h/o HTN, HLD, glaucoma who recently underwent ex lap with sigmoidectomy and Hartmann's procedure on 6/22 with Dr. KANDICE Idler. She was started on IV antibiotics, her diet was gradually advanced, and patient initially seemed to be recovering well, but CT on 6/29 revealed complex free fluid w/ air in the surgical bed concerning for superimposed infection vs bowel leak. IR consulted for percutaneous drain placement.   Dr. Philip placed a drain into the fluid collection on 6/30.  A CT on 7/5 was notable for enlarged fluid collection despite evidence of adequate placement of drain. IR consulted for potential placement of additional drain into the collection.   She has been NPO well beyond required time for sedation. Currently receiving TPN. NG in place draining to intermit wall suction.   Does not wear CPAP or use supplemental home O2.   Allergies Reviewed:  Codeine   Patient is Full Code  Past Medical History:  Diagnosis Date   Arthritis    Blood in stool    greater than 4 years since last incidence of blood in stool    Colon polyps    Glaucoma    Hyperlipidemia    Hypertension    Inclusion cyst 2021   face   PNA (pneumonia) 2006   Strain of left wrist 04/16/2019    Past Surgical History:  Procedure Laterality Date   ABSCESS DRAINAGE  2021   face- inclusion cyst   BREAST BIOPSY Left 07/19/2018   US  guided breast bx with clip placement- benign   COLON RESECTION SIGMOID  07/30/2023   Procedure: COLECTOMY, SIGMOID, OPEN;  Surgeon: Idler Cordella LABOR, MD;  Location: [facility] OR;  Service: General;;   EYE SURGERY  1956   HEMORRHOID SURGERY  2001   LAPAROTOMY N/A 07/30/2023   Procedure: LAPAROTOMY, EXPLORATORY;  Surgeon: Idler Cordella LABOR, MD;  Location: [facility] OR;   Service: General;  Laterality: N/A;   RADIOACTIVE SEED GUIDED EXCISIONAL BREAST BIOPSY Left 03/15/2019   Procedure: RADIOACTIVE SEED GUIDED EXCISIONAL LEFT  BREAST BIOPSY;  Surgeon: Gladis Cough, MD;  Location: [facility];  Service: General;  Laterality: Left;   TONSILLECTOMY  1963   WISDOM TOOTH EXTRACTION        Medications: Prior to Admission medications   Medication Sig Start Date End Date Taking? Authorizing Provider  albuterol  (VENTOLIN  HFA) 108 (90 Base) MCG/ACT inhaler Inhale 2 puffs into the lungs every 6 (six) hours as needed for wheezing or shortness of breath. 03/03/22  Yes Kuneff, Renee A, DO  Calcium  Polycarbophil (FIBER-CAPS PO) Take 1 capsule by mouth daily at 12 noon.   Yes [provider]  HYDROcodone -acetaminophen  (NORCO/VICODIN) 5-325 MG tablet Take 1 tablet by mouth every 6 (six) hours as needed for moderate pain (pain score 4-6). 07/29/23  Yes Zackowski, Scott, MD  hydrocortisone  (ANUSOL -HC) 25 MG suppository Place 1 suppository (25 mg total) rectally 2 (two) times daily. 06/23/23  Yes Kuneff, Renee A, DO  hydrocortisone  cream 1 % Apply 1 Application topically 2 (two) times daily. 06/08/23  Yes Kuneff, Renee A, DO  latanoprost  (XALATAN ) 0.005 % ophthalmic solution Place 1 drop into both eyes at bedtime. 08/11/17  Yes [provider]  LINZESS 72 MCG capsule Take 72 mcg by mouth daily before breakfast. 06/26/23  Yes [provider]  lisinopril  (ZESTRIL ) 10 MG tablet TAKE 1 TABLET BY MOUTH DAILY 05/22/23  Yes Kuneff, Renee A, DO  Multiple Vitamin (MULTIVITAMIN) tablet Take 1 tablet by mouth daily.   Yes [provider]  ondansetron  (ZOFRAN -ODT) 4 MG disintegrating tablet Take 1 tablet (4 mg total) by mouth every 8 (eight) hours as needed for nausea or vomiting. 07/29/23  Yes Zackowski, Scott, MD  Probiotic Product (DAILY PROBIOTIC PO) Take 1 capsule by mouth daily at 12 noon.   Yes [provider]  timolol  (TIMOPTIC ) 0.5 %  ophthalmic solution Place 1 drop into both eyes 2 (two) times daily. 11/14/19  Yes [provider]  amoxicillin -clavulanate (AUGMENTIN ) 875-125 MG tablet Take 1 tablet by mouth every 12 (twelve) hours. Patient not taking: Reported on 07/31/2023 07/29/23   Zackowski, Scott, MD  atorvastatin  (LIPITOR) 20 MG tablet Take 1 tablet (20 mg total) by mouth at bedtime. Patient not taking: Reported on 02/22/2023 11/30/21   Catherine Charlies LABOR, DO     Family History  Problem Relation Age of Onset   Heart attack Father    Heart attack Sister    Hyperlipidemia Sister    Hypertension Sister     Social History   Socioeconomic History   Marital status: Married    Spouse name: Not on file   Number of children: Not on file   Years of education: Not on file   Highest education level: Bachelor's degree (e.g., BA, AB, BS)  Occupational History   Not on file  Tobacco Use   Smoking status: Former    Current packs/day: 0.00    Types: Cigars, Cigarettes   Smokeless tobacco: Never   Tobacco comments:    *occasional smoker   Vaping Use   Vaping status: Former  Substance and Sexual Activity   Alcohol use: Yes    Alcohol/week: 4.0 standard drinks of alcohol    Types: 4 Glasses of wine per week    Comment: *rarely    Drug use: No   Sexual activity: Yes    Partners: Male    Comment: Married  Other Topics Concern   Not on file  Social History Narrative   Married. Retired. 2 children.   Bachelors degree.   Exercises routinely.   Drinks caffeine.   Smoke alarm in the home. Wears her seatbelt. Owns firearms.   Feels safe in her relationships.   Social Drivers of Health   Financial Resource Strain: Patient Declined (04/19/2023)   Overall Financial Resource Strain (CARDIA)    Difficulty of Paying Living Expenses: Patient declined  Food Insecurity: Patient Declined (08/02/2023)   Hunger Vital Sign    Worried About Running Out of Food in the Last Year: Patient declined    Ran Out of Food in the  Last Year: Patient declined  Transportation Needs: Patient Declined (08/02/2023)   PRAPARE - Administrator, Civil Service (Medical): Patient declined    Lack of Transportation (Non-Medical): Patient declined  Physical Activity: Sufficiently Active (04/19/2023)   Exercise Vital Sign    Days of Exercise per Week: 5 days    Minutes of Exercise per Session: 30 min  Stress: No Stress Concern Present (04/19/2023)   [employer] of Occupational Health - Occupational Stress Questionnaire    Feeling of Stress : Not at all  Social Connections: Patient Declined (08/02/2023)   Social Connection and Isolation Panel    Frequency of Communication with Friends and Family: Patient declined    Frequency of Social Gatherings with  Friends and Family: Patient declined    Attends Religious Services: Patient declined    Active Member of Clubs or Organizations: Patient declined    Attends Banker Meetings: Patient declined    Marital Status: Patient declined     Review of Systems: A 12 point ROS discussed and pertinent positives are indicated in the HPI above.  All other systems are negative.  Review of Systems  Constitutional:  Negative for chills and fever.  Respiratory:  Negative for chest tightness and shortness of breath.   Cardiovascular:  Negative for chest pain.  Gastrointestinal:  Positive for abdominal distention and abdominal pain. Negative for blood in stool, nausea and vomiting.  Genitourinary:  Negative for hematuria.    Vital Signs: BP 103/61 (BP Location: Left Arm)   Pulse 69   Temp 97.7 F (36.5 C) (Oral)   Resp 14   Ht 5' 4 (1.626 m)   Wt 131 lb 13.4 oz (59.8 kg)   SpO2 93%   BMI 22.63 kg/m   Advance Care Plan: No documents on file   Physical Exam HENT:     Nose:     Comments: NG present Cardiovascular:     Rate and Rhythm: Normal rate and regular rhythm.  Pulmonary:     Effort: Pulmonary effort is normal.     Breath sounds: Normal breath  sounds.  Abdominal:     Tenderness: There is abdominal tenderness.     Comments: Midline abd surgical wound. R TG drain insertion unremarkable. Scant bloody/tan output in bulb. Ostomy present.   Musculoskeletal:     Comments: RUE PICC  Skin:    General: Skin is warm and dry.  Neurological:     Mental Status: She is alert and oriented to person, place, and time.  Psychiatric:        Mood and Affect: Mood normal.        Behavior: Behavior normal.        Thought Content: Thought content normal.        Judgment: Judgment normal.     Imaging: CT ABDOMEN PELVIS W CONTRAST Result Date: 08/12/2023 CLINICAL DATA:  Postop abdominal pain. Patient underwent exploratory laparotomy with Hartmann's procedure for stercoral colitis and perforation. Subsequent pelvic percutaneous drain placement for abscess. EXAM: CT ABDOMEN AND PELVIS WITH CONTRAST TECHNIQUE: Multidetector CT imaging of the abdomen and pelvis was performed using the standard protocol following bolus administration of intravenous contrast. RADIATION DOSE REDUCTION: This exam was performed according to the departmental dose-optimization program which includes automated exposure control, adjustment of the mA and/or kV according to patient size and/or use of iterative reconstruction technique. CONTRAST:  75mL OMNIPAQUE  IOHEXOL  350 MG/ML SOLN COMPARISON:  Abdominopelvic CT 08/06/2023. FINDINGS: Lower chest: Trace left pleural effusion and mild bibasilar atelectasis again noted, similar to previous CT. There is atherosclerosis of the aorta and coronary arteries. Hepatobiliary: The liver is normal in density without suspicious focal abnormality. Stable hepatic cysts. No evidence of gallstones, gallbladder wall thickening or biliary dilatation. Pancreas: Unremarkable. No pancreatic ductal dilatation or surrounding inflammatory changes. Spleen: Normal in size without focal abnormality. Adrenals/Urinary Tract: Both adrenal glands appear normal. There is a  persistent partially obstructing calculus in the distal left ureter, measuring 11 mm on coronal image 65/7. Resulting hydronephrosis and hydroureter with delayed contrast excretion. No evidence of right ureteral obstruction. There are small renal cysts bilaterally for which no specific follow-up imaging is recommended. The bladder appears unremarkable for its degree of distention. Stomach/Bowel: No enteric contrast  administered. Nasogastric tube extends into the mid stomach. The stomach is decompressed. Multiple fluid-filled loops of dilated small bowel are again noted, without focal transition point or significant wall thickening. The appendix is not clearly visualized, although no pericecal inflammatory changes are identified. Colonic stool burden has improved compared with the previous study. The colon is relatively decompressed proximal to the sigmoid colostomy. The rectal pouch is fluid-filled and mildly distended. Vascular/Lymphatic: There are no enlarged abdominal or pelvic lymph nodes. Aortic and branch vessel atherosclerosis without evidence of aneurysm or large vessel occlusion. The portal, superior mesenteric and splenic veins are patent. Reproductive: The uterus and ovaries appear unremarkable. No evidence of adnexal mass. Other: Open midline anterior abdominal wall incision. New right transgluteal percutaneous drain appears appropriately position within the previously demonstrated complex pelvic fluid collection. However, this collection remains sizable and has slightly enlarged compared with the previous study, measuring 8.0 x 6.7 cm on image 65/4. This collection likely communicates with an adjacent collection along the left pelvic sidewall, measuring 7.0 x 2.9 cm on image 68/4 and abuts the rectal pouch suture line. There are other less well-defined but enlarging peritoneal fluid collections with associated air bubbles and peritoneal enhancement, highly suspicious for peritonitis. A component  inferior to the cecum measures up to 8.0 x 6.0 cm on image 58/4, and there is a component inferior to the colostomy measuring 7.4 x 5.3 cm on image 52/4. Musculoskeletal: No acute or significant osseous findings. Multilevel spondylosis. No evidence of discitis or osteomyelitis. IMPRESSION: 1. New right transgluteal percutaneous drain appears appropriately positioned within the previously demonstrated complex pelvic fluid collection. However, this collection remains sizable and has slightly enlarged compared with the previous study. This abuts the rectal suture line and a leak in this area cannot be excluded. 2. There are other less well-defined but enlarging peritoneal fluid collections with associated air bubbles and peritoneal enhancement, highly suspicious for peritonitis. 3. Persistent partially obstructing 11 mm calculus in the distal left ureter with resulting hydronephrosis and hydroureter. 4. Persistent fluid-filled loops of dilated small bowel without focal transition point, likely ileus. 5. No other significant parenchymal findings. 6.  Aortic Atherosclerosis (ICD10-I70.0). 7. These results will be called to the ordering clinician or representative by the Radiologist Assistant, and communication documented in the PACS or [employer]. Electronically Signed   By: Elsie Perone M.D.   On: 08/12/2023 19:06   US  EKG SITE RITE Result Date: 08/12/2023 If Site Rite image not attached, placement could not be confirmed due to current cardiac rhythm.  DG Abd 2 Views Result Date: 08/12/2023 CLINICAL DATA:  Follow-up ileus EXAM: ABDOMEN - 2 VIEW COMPARISON:  08/11/2023 FINDINGS: Enteric tube tip and side port are within the gastric fundus. Percutaneous drainage catheter enters from a right lower quadrant approach and terminates in the right hemipelvis. Persistent dilated loops of small bowel within the left hemiabdomen measure up to 4.3 cm, similar to previous exam. No signs of pneumoperitoneum. IMPRESSION:  Persistent dilated loops of small bowel within the left hemiabdomen, similar to previous exam. Electronically Signed   By: Waddell Calk M.D.   On: 08/12/2023 09:17   DG Abd 1 View Result Date: 08/11/2023 CLINICAL DATA:  NG tube placement EXAM: ABDOMEN - 1 VIEW COMPARISON:  08/11/2023 FINDINGS: NG tube tip is in the fundus of the stomach. Dilated small bowel loops. Gas within the colon, but the colon appears to be decompressed. No organomegaly or free air. IMPRESSION: NG tube tip in the stomach. Gaseous distension of multiple  small bowel loops, similar to prior study. Electronically Signed   By: Franky Crease M.D.   On: 08/11/2023 18:41   DG Abd 1 View Result Date: 08/11/2023 CLINICAL DATA:  Follow-up ileus EXAM: ABDOMEN - 1 VIEW COMPARISON:  08/06/2023 radiograph and CT FINDINGS: Few air dilated loops of small bowel measuring up to 4 cm with scattered colon gas. Left lower quadrant ostomy. Drainage catheter in the right pelvis. Decreased stool in the colon. IMPRESSION: Few air dilated loops of small bowel with scattered colon gas, suggesting ileus, findings do not appear greatly changed compared to scout image from the CT 08/06/2023. Electronically Signed   By: Luke Bun M.D.   On: 08/11/2023 14:15   ECHOCARDIOGRAM COMPLETE Result Date: 08/07/2023    ECHOCARDIOGRAM REPORT   Patient Name:   YELITZA REACH Date of Exam: 08/07/2023 Medical Rec #:  [id number]      Height:       64.0 in Accession #:    [id number]     Weight:       136.2 lb Date of Birth:  11-28-1950     BSA:          1.662 m Patient Age:    72 years       BP:           137/59 mmHg Patient Gender: F              HR:           79 bpm. Exam Location:  Inpatient Procedure: 2D Echo, Cardiac Doppler and Color Doppler (Both Spectral and Color            Flow Doppler were utilized during procedure). Indications:    CHF  History:        Patient has no prior history of Echocardiogram examinations.                 CHF; Risk Factors:Hypertension.   Sonographer:    Vella Key Referring Phys: BRAYTON GORMAN LYE IMPRESSIONS  1. Left ventricular ejection fraction, by estimation, is 60 to 65%. The left ventricle has normal function. The left ventricle has no regional wall motion abnormalities. There is mild concentric left ventricular hypertrophy. Left ventricular diastolic parameters were normal.  2. Right ventricular systolic function is normal. The right ventricular size is normal.  3. The mitral valve is normal in structure. Trivial mitral valve regurgitation. No evidence of mitral stenosis.  4. The aortic valve is normal in structure. Aortic valve regurgitation is not visualized. No aortic stenosis is present.  5. The inferior vena cava is normal in size with greater than 50% respiratory variability, suggesting right atrial pressure of 3 mmHg. FINDINGS  Left Ventricle: Left ventricular ejection fraction, by estimation, is 60 to 65%. The left ventricle has normal function. The left ventricle has no regional wall motion abnormalities. The left ventricular internal cavity size was normal in size. There is  mild concentric left ventricular hypertrophy. Left ventricular diastolic parameters were normal. Right Ventricle: The right ventricular size is normal. No increase in right ventricular wall thickness. Right ventricular systolic function is normal. Left Atrium: Left atrial size was normal in size. Right Atrium: Right atrial size was normal in size. Pericardium: There is no evidence of pericardial effusion. Mitral Valve: The mitral valve is normal in structure. Trivial mitral valve regurgitation. No evidence of mitral valve stenosis. Tricuspid Valve: The tricuspid valve is normal in structure. Tricuspid valve regurgitation is mild . No evidence of tricuspid  stenosis. Aortic Valve: The aortic valve is normal in structure. Aortic valve regurgitation is not visualized. No aortic stenosis is present. Pulmonic Valve: The pulmonic valve was normal in structure. Pulmonic  valve regurgitation is trivial. No evidence of pulmonic stenosis. Aorta: The aortic root is normal in size and structure. Venous: The inferior vena cava is normal in size with greater than 50% respiratory variability, suggesting right atrial pressure of 3 mmHg. IAS/Shunts: No atrial level shunt detected by color flow Doppler.  LEFT VENTRICLE PLAX 2D LVIDd:         3.20 cm     Diastology LVIDs:         2.90 cm     LV e' medial:    8.70 cm/s LV PW:         1.00 cm     LV E/e' medial:  9.2 LV IVS:        0.90 cm     LV e' lateral:   9.46 cm/s LVOT diam:     1.80 cm     LV E/e' lateral: 8.5 LV SV:         78 LV SV Index:   47 LVOT Area:     2.54 cm  LV Volumes (MOD) LV vol d, MOD A2C: 65.8 ml LV vol d, MOD A4C: 69.6 ml LV vol s, MOD A2C: 25.6 ml LV vol s, MOD A4C: 30.6 ml LV SV MOD A2C:     40.2 ml LV SV MOD A4C:     69.6 ml LV SV MOD BP:      39.5 ml RIGHT VENTRICLE RV Basal diam:  2.90 cm RV S prime:     17.70 cm/s TAPSE (M-mode): 2.5 cm LEFT ATRIUM             Index        RIGHT ATRIUM           Index LA diam:        2.70 cm 1.62 cm/m   RA Area:     12.90 cm LA Vol (A2C):   41.9 ml 25.21 ml/m  RA Volume:   26.20 ml  15.77 ml/m LA Vol (A4C):   37.9 ml 22.81 ml/m LA Biplane Vol: 40.5 ml 24.37 ml/m  AORTIC VALVE LVOT Vmax:   152.00 cm/s LVOT Vmean:  109.000 cm/s LVOT VTI:    0.305 m  AORTA Ao Root diam: 3.00 cm Ao Asc diam:  3.20 cm MITRAL VALVE               TRICUSPID VALVE MV Area (PHT): 2.63 cm    TV Peak grad:   30.2 mmHg MV Decel Time: 288 msec    TV Vmax:        2.75 m/s MV E velocity: 80.20 cm/s MV A velocity: 99.60 cm/s  SHUNTS MV E/A ratio:  0.81        Systemic VTI:  0.30 m                            Systemic Diam: 1.80 cm Aditya Sabharwal Electronically signed by Ria Commander Signature Date/Time: 08/07/2023/3:37:59 PM    Final    CT GUIDED PERITONEAL/RETROPERITONEAL FLUID DRAIN BY PERC CATH Result Date: 08/07/2023 INDICATION: 73 year old with history of exploratory laparotomy with Hartmann's  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 Patient has postoperative fluid collections that are concerning for abscess. EXAM: CT-GUIDED PLACEMENT OF PELVIC ABSCESS DRAIN TECHNIQUE: Multidetector  CT imaging of the pelvis was performed following the standard protocol without IV contrast. RADIATION DOSE REDUCTION: This exam was performed according to the departmental dose-optimization program which includes automated exposure control, adjustment of the mA and/or kV according to patient size and/or use of iterative reconstruction technique. MEDICATIONS: Moderate sedation ANESTHESIA/SEDATION: Moderate (conscious) sedation was employed during this procedure. A total of Versed  4 mg and Fentanyl  200 mcg was administered intravenously by the radiology nurse. Total intra-service moderate Sedation Time: 31 minutes. The patient's level of consciousness and vital signs were monitored continuously by radiology nursing throughout the procedure under my direct supervision. COMPLICATIONS: None immediate. PROCEDURE: Informed written consent was obtained from the patient after a thorough discussion of the procedural risks, benefits and alternatives. All questions were addressed. Maximal Sterile Barrier Technique was utilized including caps, mask, sterile gowns, sterile gloves, sterile drape, hand hygiene and skin antiseptic. A timeout was performed prior to the initiation of the procedure. Patient was placed prone on the CT scanner. Images through the pelvis were obtained. Right buttock region was prepped with chlorhexidine  and sterile field was created. Skin and soft tissues were anesthetized with 1% lidocaine . Small incision was made. Using CT guidance, an 18 gauge trocar needle was directed into the pelvic fluid collection using a right transgluteal approach. Gray colored purulent fluid was aspirated. Superstiff Amplatz wire was advanced into the collection. Follow up CT images were obtained. The tract was  dilated to accommodate a 12 [place] multipurpose drain. 100 mL of purulent fluid was aspirated. Follow up CT images were obtained. Drain was sutured to skin and attached to a suction bulb. Fluid was sent for culture. FINDINGS: 43 French drain placed from a right transgluteal approach. 100 mL of purulent fluid was removed. IMPRESSION: CT-guided placement of a drain in the pelvic abscess. Electronically Signed   By: Juliene Balder M.D.   On: 08/07/2023 14:34   DG Abd Portable 1V Result Date: 08/06/2023 CLINICAL DATA:  NG tube placement. EXAM: PORTABLE ABDOMEN - 1 VIEW COMPARISON:  CT earlier today FINDINGS: Tip and side port of the enteric tube below the diaphragm in the stomach. Dilated bowel loops in the upper abdomen are partially included in the field of view. IMPRESSION: Tip and side port of the enteric tube below the diaphragm in the stomach. Electronically Signed   By: Andrea Gasman M.D.   On: 08/06/2023 20:14   CT ABDOMEN PELVIS W CONTRAST Addendum Date: 08/06/2023 ADDENDUM REPORT: 08/06/2023 16:20 ADDENDUM: Critical Value/emergent results were called by telephone at the time of interpretation on 08/06/2023 at 4:20 pm to provider DAWOOD [facility] , who verbally acknowledged these results. Electronically Signed   By: [name] Birmingham M.D.   On: 08/06/2023 16:20   Result Date: 08/06/2023 CLINICAL DATA:  History of bowel perforation postoperatively and hydronephrosis. Worsening leukocytosis. EXAM: CT ABDOMEN AND PELVIS WITH CONTRAST TECHNIQUE: Multidetector CT imaging of the abdomen and pelvis was performed using the standard protocol following bolus administration of intravenous contrast. RADIATION DOSE REDUCTION: This exam was performed according to the departmental dose-optimization program which includes automated exposure control, adjustment of the mA and/or kV according to patient size and/or use of iterative reconstruction technique. CONTRAST:  75mL OMNIPAQUE  IOHEXOL  350 MG/ML SOLN COMPARISON:   07/30/2023. FINDINGS: Lower chest: Multi-vessel coronary artery calcifications are noted. There is a small left pleural effusion. Atelectasis or infiltrate is present at the lung bases bilaterally, greater on the left than on the right. Hepatobiliary: Stable cysts are present in the left lobe of  the liver. The gallbladder is without stones. No biliary ductal dilatation is seen. Pancreas: Unremarkable. No pancreatic ductal dilatation or surrounding inflammatory changes. Spleen: Normal in size without focal abnormality. Adrenals/Urinary Tract: The adrenal glands are within normal limits. There is a slightly delayed nephrogram on the left. No renal calculus is seen bilaterally. Scattered hypodensities are noted in the kidneys bilaterally, likely cysts. There is moderate hydronephrosis on the left with a 9 mm calculus in the distal left ureter at the pelvic inlet. No hydronephrosis on the right. The bladder is unremarkable. Stomach/Bowel: The stomach is distended with fluid. Multiple loops of distended small bowel are noted in the abdomen measuring up to 4.3 cm in the mid left abdomen. No definite transition point is seen. There is evidence of partial colectomy with left lower quadrant colostomy. Appendix appears normal. Vascular/Lymphatic: Aortic atherosclerosis. No enlarged abdominal or pelvic lymph nodes. Reproductive: Uterus and bilateral adnexa are unremarkable. Other: Postsurgical changes are noted in the low anterior abdominal wall. Fat stranding, free air, and free fluid are present in the inferior abdomen and pelvis. There is a complex fluid collection containing air in the pelvis and presacral space. Mild anasarca is noted. Musculoskeletal: Degenerative changes are noted in the thoracolumbar spine. No acute osseous abnormality is seen. IMPRESSION: 1. Status post partial colectomy with left lower quadrant ostomy. Complex free fluid with multiple foci of air, most pronounced in the surgical bed. While findings  may be postsurgical, possibility of superimposed infection or bowel leak cannot be excluded. 2. A few mildly distended loops of small bowel in the mid left abdomen with no obvious transition point, possible postoperative ileus versus early or partial small bowel obstruction. 3. Small left pleural effusion with atelectasis or infiltrate at the lung bases. 4. Moderate left hydroureteronephrosis with a 9 mm stone in the distal left ureter at the pelvic inlet, unchanged. 5. Aortic atherosclerosis and coronary artery calcifications. Electronically Signed: By: [name] Birmingham M.D. On: 08/06/2023 16:10   DG Abd Portable 1V Result Date: 08/03/2023 CLINICAL DATA:  Distension EXAM: PORTABLE ABDOMEN - 1 VIEW COMPARISON:  CT 07/30/2023 FINDINGS: Interval left lower quadrant ostomy. Mildly dilated gas-filled bowel in the left lower quadrant measuring up to 3.6 cm. Moderate to large stool burden. IMPRESSION: Interval left lower quadrant ostomy. Mildly dilated gas-filled bowel in the left lower quadrant, possible mild postoperative ileus. Moderate to large stool burden. Electronically Signed   By: Luke Bun M.D.   On: 08/03/2023 14:52   US  RENAL Result Date: 08/01/2023 CLINICAL DATA:  Hydronephrosis EXAM: RENAL / URINARY TRACT ULTRASOUND COMPLETE COMPARISON:  CT abdomen pelvis 07/30/2023 FINDINGS: Right Kidney: Renal measurements: 11.4 x 4.8 x 4.6 cm = volume: 131 mL. Echogenicity within normal limits. No mass or hydronephrosis visualized. Left Kidney: Renal measurements: 12.2 x 5.0 x 4.7 cm = volume: 152 mL. Echogenicity within normal limits. No mass or hydronephrosis visualized. Bladder: Visualized due to overlying bandages. Other: None. IMPRESSION: No hydronephrosis. Electronically Signed   By: Norman Gatlin M.D.   On: 08/01/2023 22:19   DG Chest Port 1 View Result Date: 07/30/2023 CLINICAL DATA:  Abdominal pain EXAM: PORTABLE CHEST 1 VIEW COMPARISON:  07/29/2023 FINDINGS: Heart size and mediastinal contours are  normal. Aortic atherosclerotic calcifications. Low lung volumes. No pleural fluid, interstitial edema or airspace consolidation. The visualized osseous structures appear intact. IMPRESSION: Low lung volumes. No acute findings. Electronically Signed   By: Birmingham Calk M.D.   On: 07/30/2023 10:23   CT Renal Stone Study Result Date:  07/30/2023 CLINICAL DATA:  Known left ureteral stone with worsening abdominal pain and hematuria EXAM: CT ABDOMEN AND PELVIS WITHOUT CONTRAST TECHNIQUE: Multidetector CT imaging of the abdomen and pelvis was performed following the standard protocol without IV contrast. RADIATION DOSE REDUCTION: This exam was performed according to the departmental dose-optimization program which includes automated exposure control, adjustment of the mA and/or kV according to patient size and/or use of iterative reconstruction technique. COMPARISON:  CT abdomen and pelvis dated 07/29/2023 FINDINGS: Lower chest: No focal consolidation or pulmonary nodule in the lung bases. No pleural effusion or pneumothorax demonstrated. Partially imaged heart size is normal. Coronary artery calcifications. Hepatobiliary: Unchanged segment 2 cyst. No intra or extrahepatic biliary ductal dilation. Vicariously excreted contrast within the gallbladder. Pancreas: No focal lesions or main ductal dilation. Spleen: Normal in size without focal abnormality. Adrenals/Urinary Tract: No adrenal nodules. Interval decreased left hydroureteronephrosis. Excreted contrast material within the renal collecting systems. Similar location of distal left ureteral stone measuring 9 mm (2:54). Urinary bladder is decompressed. Stomach/Bowel: Normal appearance of the stomach. Persistent large volume stool within the dilated sigmoid colon. Interval increased dilation of the stool-filled cecum. Appendix is not discretely seen. Vascular/Lymphatic: Aortic atherosclerosis. No enlarged abdominal or pelvic lymph nodes. Reproductive: No adnexal masses.  Other: Interval development of intraperitoneal free air. New moderate volume free fluid containing layering hyperdensities. Musculoskeletal: No acute or abnormal lytic or blastic osseous lesions. Multilevel degenerative changes of the partially imaged thoracic and lumbar spine. Small fat-containing paraumbilical hernia. IMPRESSION: 1. Interval development of intraperitoneal free air and moderate volume free fluid containing layering hyperdensities, consistent with bowel perforation. 2. Persistent large volume stool within the dilated sigmoid colon. Interval increased dilation of the stool-filled cecum. 3. Similar location of distal left ureteral stone measuring 9 mm with interval decreased left hydroureteronephrosis. 4.  Aortic Atherosclerosis (ICD10-I70.0). Critical Value/emergent results were called by telephone at the time of interpretation on 07/30/2023 at 10:06 am to provider SCOTT ZACKOWSKI , who verbally acknowledged these results. Electronically Signed   By: Limin  Xu M.D.   On: 07/30/2023 10:12   CT ABDOMEN PELVIS W CONTRAST Result Date: 07/29/2023 CLINICAL DATA:  Six week history of left lower quadrant abdominal pain associated with nausea and chills EXAM: CT ABDOMEN AND PELVIS WITH CONTRAST TECHNIQUE: Multidetector CT imaging of the abdomen and pelvis was performed using the standard protocol following bolus administration of intravenous contrast. RADIATION DOSE REDUCTION: This exam was performed according to the departmental dose-optimization program which includes automated exposure control, adjustment of the mA and/or kV according to patient size and/or use of iterative reconstruction technique. CONTRAST:  OMNIPAQUE  IOHEXOL  300 MG/ML  SOLN COMPARISON:  CT abdomen and pelvis dated 09/17/2020 FINDINGS: Lower chest: No focal consolidation or pulmonary nodule in the lung bases. No pleural effusion or pneumothorax demonstrated. Partially imaged heart size is normal. Hepatobiliary: Unchanged 1.7 cm  segment 2 hypodensity (2:14), likely cyst. Additional subcentimeter hypodensity (2:17), too small to characterize but also likely cysts. No intra or extrahepatic biliary ductal dilation. Normal gallbladder. Pancreas: No focal lesions or main ductal dilation. Spleen: Normal in size without focal abnormality. Adrenals/Urinary Tract: No adrenal nodules. Mild left hydroureteronephrosis to the level of an obstructing 9 mm mid ureteral stone. Bilateral simple/minimally complicated cysts. No focal bladder wall thickening. Stomach/Bowel: Normal appearance of the stomach. Mural thickening of the sigmoid colon, which contains large volume stool. Moderate volume stool throughout the colon. Appendix is not discretely seen. Vascular/Lymphatic: Aortic atherosclerosis. No enlarged abdominal or pelvic lymph nodes. Reproductive:  No adnexal masses. Other: Trace pelvic free fluid.  No free air or fluid collection. Musculoskeletal: No acute or abnormal lytic or blastic osseous lesions. Multilevel degenerative changes of the partially imaged thoracic and lumbar spine. Small fat-containing left inguinal hernia. IMPRESSION: 1. Mild left hydroureteronephrosis to the level of an obstructing 9 mm mid ureteral stone. 2. Mural thickening of the sigmoid colon, which contains large volume stool, which may be related to stercoral colitis. Consider correlation with colonoscopy once acute symptoms have resolved to exclude underlying mass lesion in this area if one has not been performed recently. 3.  Aortic Atherosclerosis (ICD10-I70.0). Electronically Signed   By: Limin  Xu M.D.   On: 07/29/2023 09:54   DG Chest Port 1 View Result Date: 07/29/2023 CLINICAL DATA:  Lower abdominal pain, nausea, chills EXAM: PORTABLE CHEST 1 VIEW COMPARISON:  None Available. FINDINGS: Normal lung volumes. No focal consolidations. No pleural effusion or pneumothorax. The heart size and mediastinal contours are within normal limits. No acute osseous abnormality.  IMPRESSION: No acute disease. Electronically Signed   By: Limin  Xu M.D.   On: 07/29/2023 08:41    Labs:  CBC: Recent Labs    08/09/23 0546 08/10/23 0554 08/12/23 0401 08/14/23 0130  WBC 9.9 7.7 6.7 5.9  HGB 8.9* 8.7* 9.0* 7.2*  HCT 27.3* 26.6* 27.1* 23.2*  PLT 588* 701* 787* 639*    COAGS: Recent Labs    07/29/23 0826 08/07/23 0909  INR 1.1 1.2    BMP: Recent Labs    08/11/23 0505 08/12/23 0401 08/13/23 0325 08/14/23 0130  NA 135 136 138 137  K 3.4* 4.1 2.8* 3.3*  CL 97* 95* 98 100  CO2 24 30 31 30   GLUCOSE 86 74 137* 144*  BUN 17 15 17 17   CALCIUM  7.8* 8.0* 7.3* 7.5*  CREATININE 0.59 0.81 0.50 0.41*  GFRNONAA >60 >60 >60 >60    LIVER FUNCTION TESTS: Recent Labs    08/06/23 0807 08/10/23 0554 08/11/23 0505 08/14/23 0130  BILITOT 0.5 0.8 1.2 0.3  AST 24 17 13* 13*  ALT 17 12 11 11   ALKPHOS 62 75 75 54  PROT 5.3* 5.0* 5.2* 4.8*  ALBUMIN  1.6* 1.6* 1.7* <1.5*    TUMOR MARKERS: No results for input(s): AFPTM, CEA, CA199, CHROMGRNA in the last 8760 hours.  Assessment and Plan:  After speaking with the patient, learned the drain had not been flushed in days. Flushed easily with 5 cc NS, does not aspirate.   Request for image guided reposition of existing fluid collection drain or placement of new drain into the collection. Existing drain originally placed 6/30 by Dr. Philip. Case shared with Dr. Philip who reviewed imaging, notes that tip of current drain appears pulled back from original location in collection. Recommends reposition and/or placement of new fluid collection drain. Patient and patient family aware that she may need multiple drains to address the fluid collection.   Dr. Philip will attempt to perform this today if the schedule allows. Patient aware that procedure may need to occur tomorrow.   After speaking with the patient, learned the drain had not been flushed in days. Flushed easily with 5 cc NS, does not aspirate.   No  contraindications for procedure identified in ROS, physical exam, or review of pre-sedation considerations. Labs reviewed and within acceptable range; INR pending. 7/5 imaging available and reviewed VSS, afebrile Patient does not take a blood thinner  Receiving zosyn  from IP team's regimen  Risks and benefits discussed with the patient  including bleeding, infection, damage to adjacent structures, bowel perforation/fistula connection, and sepsis.  All of the patient's questions were answered, patient is agreeable to proceed. Consent signed and in chart.   Thank you for allowing our service to participate in HANIN DECOOK 's care.    Electronically Signed: Laymon Coast, NP   08/14/2023, 12:57 PM     I spent a total of 20 Minutes    in face to face in clinical consultation, greater than 50% of which was counseling/coordinating care for image guided drain reposition and or replacement.    (A copy of this note was sent to the referring provider and the time of visit.)

## 2023-08-14 NOTE — Plan of Care (Signed)

## 2023-08-14 NOTE — Progress Notes (Signed)
 PHARMACY - TOTAL PARENTERAL NUTRITION CONSULT NOTE  Indication: intolerance to feeding, recurrent ileus  Patient Measurements: Height: 5' 4 (162.6 cm) Weight: 59.8 kg (131 lb 13.4 oz) IBW/kg (Calculated) : 54.7 TPN AdjBW (KG): 57.2 Body mass index is 22.63 kg/m. Usual Weight: ~59 kg  Assessment:  72 YOF s/p exp lap with Hartmann's procedure for stercoral colitis with ischemia and perforation with feculent peritonitis on 6/22. Noted pt has been NPO/clear liquid/full liquid diet since after surgery with limited intake. Pt now with ileus on Xray (dilated loops of small bowel) and N/V with liquid diet so NGT replaced 7/4, now NPO and starting TPN. Pt at risk of refeeding with prolonged period of little po intake since admission.  Glucose / Insulin : no hx DM - CBGs <150. No SSI given. Electrolytes: K 3.3 (goal >4 with ileus), Mg 1.9 (goal >2 with ileus), Phos 2, Corr Ca at least 9.5 Renal: SCr < 1, BUN WNL Hepatic: LFTs / TG / tbili WNL, albumin  < 1.5 Intake / Output; MIVF: NGT o/p 1350. Colostomy o/p 50. UOP 1300 ml yday (?all of urine charted).  IR drain 5 ml. MIVF: NS with 40mEq K/L at 75 ml/hr (order also for NS at 75ml/hr but confirmed not currently hanging). GI Imaging: 7/5 abd xray - persistent dilated loops of small bowel GI Surgeries / Procedures:  6/22 ex-lap with Hartmann's procedure for stercoral colitis with ischemia and perf with feculent peritonitis  Central access: PICC placed 08/12/23 TPN start date: 08/12/23  Nutritional Goals: Goal TPN (AA 50g/L, CHO 15%, ILE 25g/L) at 75 ml/hr will provide 1728 kCal and 90g AA  RD Estimated Needs Total Energy Estimated Needs: 1650-1850 kcals Total Protein Estimated Needs: 80-90 g Total Fluid Estimated Needs: >/= 1.7 L  Current Nutrition:  TPN  Plan:  Change to compounded TPN at 55 ml/hr to provide (meeting estimated needs). Will advance to goal tomorrow as tolerated. Electrolytes in TPN: increase Na to 7mEq/L, increase K to  65mEq/L, increase Ca to 41mEq/L, increase Mg to 7 mEq/L, and increase Phos to 25 mmol/L (max in TPN). Cl:Ac 1:1 Add standard MVI and trace elements to TPN Continue very Sensitive q6h SSI and adjust as needed  Thiamine  100mg  IV q24h outside of TPN x 6 days (7/5-7/10) Kphos 45 mmol IVPB Decrease MIVF (NS with 40mEq K/L) to 40ml/hr when new TPN hangs. Monitor TPN labs on Mon/Thurs, daily until stable

## 2023-08-14 NOTE — Progress Notes (Signed)
 PT Cancellation Note  Patient Details Name: Angela [name] MRN: [id number] DOB: [birth year]   Cancelled Treatment:    Reason Eval/Treat Not Completed: Other (comment) Pt declining due to abdominal pain.  [name], PT, DPT Acute Rehabilitation Services Office [phone]    Angela [name] 08/14/2023, 4:13 PM

## 2023-08-14 NOTE — Progress Notes (Signed)
 Arrived to patient room. Patient in IR at this time. Powell Bowler, RN VAST

## 2023-08-14 NOTE — TOC Progression Note (Signed)
 Transition of Care [facility]) - Progression Note    Patient Details  Name: Angela [name] MRN: [id number] Date of Birth: March 26, 1950  Transition of Care [facility]) CM/SW Contact  Robynn Eileen Hoose, RN Phone Number: 08/14/2023, 12:59 PM  Clinical Narrative:   Discussed in progression rounds; pt noted to have coffee ground secretions in NG tube canister. IR consulted for possible additional drain placement.         Expected Discharge Plan and Services                                               Social Determinants of Health (SDOH) Interventions SDOH Screenings   Food Insecurity: Patient Declined (08/02/2023)  Housing: Unknown (08/02/2023)  Transportation Needs: Patient Declined (08/02/2023)  Utilities: Patient Declined (08/02/2023)  Alcohol Screen: Low Risk  (04/19/2023)  Depression (PHQ2-9): Low Risk  (06/23/2023)  Financial Resource Strain: Patient Declined (04/19/2023)  Physical Activity: Sufficiently Active (04/19/2023)  Social Connections: Patient Declined (08/02/2023)  Stress: No Stress Concern Present (04/19/2023)  Tobacco Use: Medium Risk (07/30/2023)  Health Literacy: Adequate Health Literacy (04/19/2023)    Readmission Risk Interventions     No data to display

## 2023-08-14 NOTE — Progress Notes (Addendum)
 15 Days Post-Op   Subjective/Chief Complaint: Patient still with abdominal pain and discomfort. CT scan from 7/5 reviewed with several fluid collections. Complains of difficulty sleeping. Some coffee ground appearing NGT output.   Objective: Vital signs in last 24 hours: Temp:  [97.7 F (36.5 C)-99.1 F (37.3 C)] 98.4 F (36.9 C) (07/07 0331) Pulse Rate:  [74-93] 93 (07/07 0331) Resp:  [16-23] 23 (07/07 0331) BP: (94-146)/(60-71) 146/67 (07/07 0331) SpO2:  [92 %-97 %] 94 % (07/07 0331) Weight:  [59.8 kg] 59.8 kg (07/07 0500) Last BM Date : 08/13/23  Intake/Output from previous day: 07/06 0701 - 07/07 0700 In: 2156.7 [I.V.:1286.3; IV Piggyback:870.4] Out: 2400 [Urine:1100; Emesis/NG output:1250; Stool:50] Intake/Output this shift: No intake/output data recorded.    Lab Results:  Gen: NAD Lungs: effort nonlabored  Abd: soft, colostomy viable and with some output, liquid stool.  Mild distention. Midline wound fascia intact and no further drainage .  IR drain with minimal more thin, but still cloudy/feculent, 0 output recorded but some fluid in drain.  BMET Recent Labs    08/13/23 0325 08/14/23 0130  NA 138 137  K 2.8* 3.3*  CL 98 100  CO2 31 30  GLUCOSE 137* 144*  BUN 17 17  CREATININE 0.50 0.41*  CALCIUM  7.3* 7.5*   PT/INR No results for input(s): LABPROT, INR in the last 72 hours. ABG No results for input(s): PHART, HCO3 in the last 72 hours.  Invalid input(s): PCO2, PO2  Studies/Results: CT ABDOMEN PELVIS W CONTRAST Result Date: 08/12/2023 CLINICAL DATA:  Postop abdominal pain. Patient underwent exploratory laparotomy with Hartmann's procedure for stercoral colitis and perforation. Subsequent pelvic percutaneous drain placement for abscess. EXAM: CT ABDOMEN AND PELVIS WITH CONTRAST TECHNIQUE: Multidetector CT imaging of the abdomen and pelvis was performed using the standard protocol following bolus administration of intravenous contrast. RADIATION  DOSE REDUCTION: This exam was performed according to the departmental dose-optimization program which includes automated exposure control, adjustment of the mA and/or kV according to patient size and/or use of iterative reconstruction technique. CONTRAST:  75mL OMNIPAQUE  IOHEXOL  350 MG/ML SOLN COMPARISON:  Abdominopelvic CT 08/06/2023. FINDINGS: Lower chest: Trace left pleural effusion and mild bibasilar atelectasis again noted, similar to previous CT. There is atherosclerosis of the aorta and coronary arteries. Hepatobiliary: The liver is normal in density without suspicious focal abnormality. Stable hepatic cysts. No evidence of gallstones, gallbladder wall thickening or biliary dilatation. Pancreas: Unremarkable. No pancreatic ductal dilatation or surrounding inflammatory changes. Spleen: Normal in size without focal abnormality. Adrenals/Urinary Tract: Both adrenal glands appear normal. There is a persistent partially obstructing calculus in the distal left ureter, measuring 11 mm on coronal image 65/7. Resulting hydronephrosis and hydroureter with delayed contrast excretion. No evidence of right ureteral obstruction. There are small renal cysts bilaterally for which no specific follow-up imaging is recommended. The bladder appears unremarkable for its degree of distention. Stomach/Bowel: No enteric contrast administered. Nasogastric tube extends into the mid stomach. The stomach is decompressed. Multiple fluid-filled loops of dilated small bowel are again noted, without focal transition point or significant wall thickening. The appendix is not clearly visualized, although no pericecal inflammatory changes are identified. Colonic stool burden has improved compared with the previous study. The colon is relatively decompressed proximal to the sigmoid colostomy. The rectal pouch is fluid-filled and mildly distended. Vascular/Lymphatic: There are no enlarged abdominal or pelvic lymph nodes. Aortic and branch vessel  atherosclerosis without evidence of aneurysm or large vessel occlusion. The portal, superior mesenteric and splenic veins are patent.  Reproductive: The uterus and ovaries appear unremarkable. No evidence of adnexal mass. Other: Open midline anterior abdominal wall incision. New right transgluteal percutaneous drain appears appropriately position within the previously demonstrated complex pelvic fluid collection. However, this collection remains sizable and has slightly enlarged compared with the previous study, measuring 8.0 x 6.7 cm on image 65/4. This collection likely communicates with an adjacent collection along the left pelvic sidewall, measuring 7.0 x 2.9 cm on image 68/4 and abuts the rectal pouch suture line. There are other less well-defined but enlarging peritoneal fluid collections with associated air bubbles and peritoneal enhancement, highly suspicious for peritonitis. A component inferior to the cecum measures up to 8.0 x 6.0 cm on image 58/4, and there is a component inferior to the colostomy measuring 7.4 x 5.3 cm on image 52/4. Musculoskeletal: No acute or significant osseous findings. Multilevel spondylosis. No evidence of discitis or osteomyelitis. IMPRESSION: 1. New right transgluteal percutaneous drain appears appropriately positioned within the previously demonstrated complex pelvic fluid collection. However, this collection remains sizable and has slightly enlarged compared with the previous study. This abuts the rectal suture line and a leak in this area cannot be excluded. 2. There are other less well-defined but enlarging peritoneal fluid collections with associated air bubbles and peritoneal enhancement, highly suspicious for peritonitis. 3. Persistent partially obstructing 11 mm calculus in the distal left ureter with resulting hydronephrosis and hydroureter. 4. Persistent fluid-filled loops of dilated small bowel without focal transition point, likely ileus. 5. No other significant  parenchymal findings. 6.  Aortic Atherosclerosis (ICD10-I70.0). 7. These results will be called to the ordering clinician or representative by the Radiologist Assistant, and communication documented in the PACS or [employer]. Electronically Signed   By: Elsie Perone M.D.   On: 08/12/2023 19:06    Anti-infectives: Anti-infectives (From admission, onward)    Start     Dose/Rate Route Frequency Ordered Stop   08/06/23 1130  acyclovir  (ZOVIRAX ) 200 MG capsule 400 mg  Status:  Discontinued        400 mg Oral 3 times daily 08/06/23 1037 08/08/23 1424   08/06/23 1100  piperacillin -tazobactam (ZOSYN ) IVPB 3.375 g        3.375 g 12.5 mL/hr over 240 Minutes Intravenous Every 8 hours 08/06/23 1051     07/30/23 1800  piperacillin -tazobactam (ZOSYN ) IVPB 3.375 g        3.375 g 12.5 mL/hr over 240 Minutes Intravenous Every 8 hours 07/30/23 1332 08/04/23 2359   07/30/23 1400  piperacillin -tazobactam (ZOSYN ) IVPB 3.375 g  Status:  Discontinued        3.375 g 100 mL/hr over 30 Minutes Intravenous Every 8 hours 07/30/23 1329 07/30/23 1332   07/30/23 1015  piperacillin -tazobactam (ZOSYN ) IVPB 3.375 g        3.375 g 100 mL/hr over 30 Minutes Intravenous  Once 07/30/23 1013 07/30/23 1311       Assessment/Plan: Procedure(s): LAPAROTOMY, EXPLORATORY (N/A) COLECTOMY, SIGMOID, OPEN s/p Procedure(s): LAPAROTOMY, EXPLORATORY (N/A) COLECTOMY, SIGMOID, OPEN POD 14, s/p ex lap with Hartmann's procedure for stercoral colitis with ischemia and perforation with feculent peritonitis, Dr. Polly 6/22  - N.p.o. status.  CT scan (7/5) shows collection remains sizable and has slightly enlarged compared with the previous study. This abuts the rectal suture line and a leak in this area cannot be excluded.   -Continue TNA  -Mobilizing well in hallway. -continue abx due to pelvic abscess, larger fluid collections from CT scan over weekend, will consult IR to eval for additional drain -  BID dressing changes  to midline wound  -WOC following -pulm toilet  - BID PPI  FEN -n.p.o./TNA VTE - lovenox  held given Hb drop, will fu afternoon CBC ID - zosyn     Nephrolithiasis - per urology,  Chronic constipation - bowel regimen HTN Glaucoma  LOS: 15 days   Discussed plan of care with patient and family at bedside.  Richerd Silversmith MD 08/14/2023

## 2023-08-15 ENCOUNTER — Inpatient Hospital Stay (HOSPITAL_COMMUNITY)

## 2023-08-15 DIAGNOSIS — I10 Essential (primary) hypertension: Secondary | ICD-10-CM | POA: Diagnosis not present

## 2023-08-15 DIAGNOSIS — N201 Calculus of ureter: Secondary | ICD-10-CM | POA: Diagnosis not present

## 2023-08-15 DIAGNOSIS — N1330 Unspecified hydronephrosis: Secondary | ICD-10-CM | POA: Diagnosis not present

## 2023-08-15 DIAGNOSIS — K631 Perforation of intestine (nontraumatic): Secondary | ICD-10-CM | POA: Diagnosis not present

## 2023-08-15 LAB — CBC
HCT: 23.2 % — ABNORMAL LOW (ref 36.0–46.0)
Hemoglobin: 7.3 g/dL — ABNORMAL LOW (ref 12.0–15.0)
MCH: 28.5 pg (ref 26.0–34.0)
MCHC: 31.5 g/dL (ref 30.0–36.0)
MCV: 90.6 fL (ref 80.0–100.0)
Platelets: 557 K/uL — ABNORMAL HIGH (ref 150–400)
RBC: 2.56 MIL/uL — ABNORMAL LOW (ref 3.87–5.11)
RDW: 15.7 % — ABNORMAL HIGH (ref 11.5–15.5)
WBC: 5.7 K/uL (ref 4.0–10.5)
nRBC: 0 % (ref 0.0–0.2)

## 2023-08-15 LAB — PROTIME-INR
INR: 1.1 (ref 0.8–1.2)
Prothrombin Time: 15.3 s — ABNORMAL HIGH (ref 11.4–15.2)

## 2023-08-15 LAB — GLUCOSE, CAPILLARY
Glucose-Capillary: 107 mg/dL — ABNORMAL HIGH (ref 70–99)
Glucose-Capillary: 114 mg/dL — ABNORMAL HIGH (ref 70–99)
Glucose-Capillary: 116 mg/dL — ABNORMAL HIGH (ref 70–99)
Glucose-Capillary: 129 mg/dL — ABNORMAL HIGH (ref 70–99)

## 2023-08-15 LAB — BASIC METABOLIC PANEL WITH GFR
Anion gap: 5 (ref 5–15)
BUN: 14 mg/dL (ref 8–23)
CO2: 27 mmol/L (ref 22–32)
Calcium: 7.5 mg/dL — ABNORMAL LOW (ref 8.9–10.3)
Chloride: 105 mmol/L (ref 98–111)
Creatinine, Ser: 0.43 mg/dL — ABNORMAL LOW (ref 0.44–1.00)
GFR, Estimated: >60 mL/min (ref >60)
Glucose, Bld: 124 mg/dL — ABNORMAL HIGH (ref 70–99)
Potassium: 4 mmol/L (ref 3.5–5.1)
Sodium: 137 mmol/L (ref 135–145)

## 2023-08-15 LAB — PHOSPHORUS: Phosphorus: 2.9 mg/dL (ref 2.5–4.6)

## 2023-08-15 LAB — MAGNESIUM: Magnesium: 1.8 mg/dL (ref 1.7–2.4)

## 2023-08-15 MED ORDER — SODIUM CHLORIDE 0.9% FLUSH
5.0000 mL | Freq: Three times a day (TID) | INTRAVENOUS | Status: AC
Start: 2023-08-15 — End: ?
  Administered 2023-08-15 – 2023-08-19 (×12): 5 mL

## 2023-08-15 MED ORDER — MAGNESIUM SULFATE 50 % IJ SOLN
3.0000 g | Freq: Once | INTRAVENOUS | Status: AC
  Administered 2023-08-15: 3 g via INTRAVENOUS
  Filled 2023-08-15: qty 6

## 2023-08-15 MED ORDER — POTASSIUM PHOSPHATES 15 MMOLE/5ML IV SOLN
15.0000 mmol | Freq: Once | INTRAVENOUS | Status: AC
  Administered 2023-08-15: 15 mmol via INTRAVENOUS
  Filled 2023-08-15: qty 5

## 2023-08-15 MED ORDER — SODIUM CHLORIDE 0.9% FLUSH
5.0000 mL | Freq: Three times a day (TID) | INTRAVENOUS | Status: DC
  Administered 2023-08-15 – 2023-08-27 (×32): 5 mL

## 2023-08-15 MED ORDER — TRAVASOL 10 % IV SOLN
INTRAVENOUS | Status: AC
  Filled 2023-08-15: qty 900

## 2023-08-15 NOTE — Progress Notes (Signed)
 OT Cancellation Note  Patient Details Name: FATIHA GUZY MRN: [id number] DOB: [birth year]   Cancelled Treatment:    Reason Eval/Treat Not Completed: Fatigue/lethargy limiting ability to participate. Pt received in bed, sister present. Pt politely declines participation in therapy at this time, stating she has just returned to bed after walking with PT and is fatigued. OT will re-attempt at a later date as able.   Keven Osborn L. Algie Westry, OTR/L  08/15/23, 3:47 PM

## 2023-08-15 NOTE — Consult Note (Addendum)
 WOC team consulted regarding wound care orders. WOC team managing ostomy only and all wound care orders are being managed by surgical team. Secure chat to primary nurse and added surgical PA to resolve this issue.    WOC will continue to see for ostomy needs.   ADDENDUM:  Burnard Louder, PA-C responded and adjusted wound care order.    Thank you,    Powell Bar MSN, RN-BC, [employer]

## 2023-08-15 NOTE — Plan of Care (Signed)

## 2023-08-15 NOTE — Progress Notes (Shared)
 Referring Provider(s): * No referring provider recorded for this case *  Supervising Physician: Vanice Revel  Patient Status:  [facility] - In-pt  Chief Complaint:  Pelvic fluid collection s/p RLQ drain and right transgluteal pelvic drain placement 08/14/23 Dr. Philip   Subjective:  ***  Allergies: Codeine  Medications: Prior to Admission medications   Medication Sig Start Date End Date Taking? Authorizing Provider  albuterol  (VENTOLIN  HFA) 108 (90 Base) MCG/ACT inhaler Inhale 2 puffs into the lungs every 6 (six) hours as needed for wheezing or shortness of breath. 03/03/22  Yes Kuneff, Renee A, DO  Calcium  Polycarbophil (FIBER-CAPS PO) Take 1 capsule by mouth daily at 12 noon.   Yes [provider]  HYDROcodone -acetaminophen  (NORCO/VICODIN) 5-325 MG tablet Take 1 tablet by mouth every 6 (six) hours as needed for moderate pain (pain score 4-6). 07/29/23  Yes Zackowski, Scott, MD  hydrocortisone  (ANUSOL -HC) 25 MG suppository Place 1 suppository (25 mg total) rectally 2 (two) times daily. 06/23/23  Yes Kuneff, Renee A, DO  hydrocortisone  cream 1 % Apply 1 Application topically 2 (two) times daily. 06/08/23  Yes Kuneff, Renee A, DO  latanoprost  (XALATAN ) 0.005 % ophthalmic solution Place 1 drop into both eyes at bedtime. 08/11/17  Yes [provider]  LINZESS 72 MCG capsule Take 72 mcg by mouth daily before breakfast. 06/26/23  Yes [provider]  lisinopril  (ZESTRIL ) 10 MG tablet TAKE 1 TABLET BY MOUTH DAILY 05/22/23  Yes Kuneff, Renee A, DO  Multiple Vitamin (MULTIVITAMIN) tablet Take 1 tablet by mouth daily.   Yes [provider]  ondansetron  (ZOFRAN -ODT) 4 MG disintegrating tablet Take 1 tablet (4 mg total) by mouth every 8 (eight) hours as needed for nausea or vomiting. 07/29/23  Yes Zackowski, Scott, MD  Probiotic Product (DAILY PROBIOTIC PO) Take 1 capsule by mouth daily at 12 noon.   Yes [provider]  timolol  (TIMOPTIC ) 0.5 % ophthalmic solution  Place 1 drop into both eyes 2 (two) times daily. 11/14/19  Yes [provider]  amoxicillin -clavulanate (AUGMENTIN ) 875-125 MG tablet Take 1 tablet by mouth every 12 (twelve) hours. Patient not taking: Reported on 07/31/2023 07/29/23   Zackowski, Scott, MD  atorvastatin  (LIPITOR) 20 MG tablet Take 1 tablet (20 mg total) by mouth at bedtime. Patient not taking: Reported on 02/22/2023 11/30/21   Catherine Fuller A, DO     Vital Signs: BP 130/89 (BP Location: Left Arm)   Pulse 80   Temp (!) 97.3 F (36.3 C) (Oral)   Resp 15   Ht 5' 4 (1.626 m)   Wt 132 lb 4.4 oz (60 kg)   SpO2 95%   BMI 22.71 kg/m   Physical Exam   Labs:  CBC: Recent Labs    08/10/23 0554 08/12/23 0401 08/14/23 0130 08/15/23 0431  WBC 7.7 6.7 5.9 5.7  HGB 8.7* 9.0* 7.2* 7.3*  HCT 26.6* 27.1* 23.2* 23.2*  PLT 701* 787* 639* 557*    COAGS: Recent Labs    07/29/23 0826 08/07/23 0909 08/15/23 0431  INR 1.1 1.2 1.1    BMP: Recent Labs    08/12/23 0401 08/13/23 0325 08/14/23 0130 08/15/23 0431  NA 136 138 137 137  K 4.1 2.8* 3.3* 4.0  CL 95* 98 100 105  CO2 30 31 30 27   GLUCOSE 74 137* 144* 124*  BUN 15 17 17 14   CALCIUM  8.0* 7.3* 7.5* 7.5*  CREATININE 0.81 0.50 0.41* 0.43*  GFRNONAA >60 >60 >60 >60    LIVER FUNCTION  TESTS: Recent Labs    08/06/23 0807 08/10/23 0554 08/11/23 0505 08/14/23 0130  BILITOT 0.5 0.8 1.2 0.3  AST 24 17 13* 13*  ALT 17 12 11 11   ALKPHOS 62 75 75 54  PROT 5.3* 5.0* 5.2* 4.8*  ALBUMIN  1.6* 1.6* 1.7* <1.5*    Assessment and Plan:  Pelvic fluid collection s/p RLQ drain and right transgluteal pelvic drain placement 08/14/23 Dr. Philip Ribas Location: RUQ, RLQ Size: Fr size: 12 Fr Date of placement: 08/14/23  Currently to: Drain collection device: suction bulb  Drain Location: right transgluteal  Size: Fr size: 12 Fr Date of placement: 08/14/23  Currently to: Drain collection device: suction bulb  24 hour output:  Output by Drain (mL) 08/13/23 0701  - 08/13/23 1900 08/13/23 1901 - 08/14/23 0700 08/14/23 0701 - 08/14/23 1900 08/14/23 1901 - 08/15/23 0700 08/15/23 0701 - 08/15/23 1155  Closed System Drain Right RLQ Bulb (JP) 12 Fr.   400 90   Closed System Drain Right Buttock Bulb (JP) 12 Fr.   100 50     Interval imaging/drain manipulation:  None   Current examination: Flushes/aspirates easily.  Insertion site unremarkable. Suture and stat lock in place. Dressed appropriately.   Plan: Continue TID flushes with 5 cc NS. Record output Q shift. Dressing changes QD or PRN if soiled.  Call IR APP or on call IR MD if difficulty flushing or sudden change in drain output.  Repeat imaging/possible drain injection once output < 10 mL/QD (excluding flush material). Consideration for drain removal if output is < 10 mL/QD (excluding flush material), pending discussion with the providing surgical service.  Discharge planning: Please contact IR APP or on call IR MD prior to patient d/c to ensure appropriate follow up plans are in place. Typically patient will follow up with IR clinic 10-14 days post d/c for repeat imaging/possible drain injection. IR scheduler will contact patient with date/time of appointment. Patient will need to flush drain QD with 5 cc NS, record output QD, dressing changes every 2-3 days or earlier if soiled.   IR will continue to follow - please call with questions or concerns.  Electronically Signed: Lavanda JAYSON Jurist, PA-C 08/15/2023, 11:52 AM    I spent a total of 15 Minutes at the the patient's bedside AND on the patient's hospital floor or unit, greater than 50% of which was counseling/coordinating care for Pelvic fluid collection s/p RLQ drain and right transgluteal pelvic drain placement 08/14/23 Dr. Philip.

## 2023-08-15 NOTE — Progress Notes (Signed)
 Physical Therapy Treatment Patient Details Name: Angela [name] MRN: [id number] DOB: 09/06/1950 Today's Date: 08/15/2023   History of Present Illness Angela [name] is a 73 y.o. female who presents with abdominal pain and bloating  Renal CT revealed interval development of intraperitoneal free air and moderate volume free fluid containing layering hyperdensities consistent with bowel perforation, distal left ureteral stone measuring 9 mm with interval decrease left hydroureteronephrosis. . Past medical history significant of hypertension, hyperlipidemia, glaucoma and prior tobacco abuse    PT Comments  Pt tolerated treatment well today. Pt today required more assistance with bed mobility today compared to previous session due to new JP drains being placed. However once upright pt still only required supervision to ambulate around unit with RW. No change in DC/DME recs at this time. PT will continue to follow.     If plan is discharge home, recommend the following: A little help with walking and/or transfers;A little help with bathing/dressing/bathroom;Assistance with cooking/housework;Direct supervision/assist for medications management;Assist for transportation;Help with stairs or ramp for entrance   Can travel by private vehicle        Equipment Recommendations  Rolling walker (2 wheels)    Recommendations for Other Services       Precautions / Restrictions Precautions Precautions: Fall;Other (comment) Recall of Precautions/Restrictions: Intact Precaution/Restrictions Comments: Multiple JP drains now along with ostomy bag. Also new NG tube as well. Restrictions Weight Bearing Restrictions Per Provider Order: No     Mobility  Bed Mobility Overal bed mobility: Needs Assistance Bed Mobility: Supine to Sit, Sit to Supine     Supine to sit: Mod assist Sit to supine: +2 for physical assistance, Min assist   General bed mobility comments: Mod A to sit EOB for trunk elevation and  scooting hip forwards. +2 Min A to return via helicopter with family.    Transfers Overall transfer level: Needs assistance Equipment used: Rolling walker (2 wheels) Transfers: Sit to/from Stand Sit to Stand: Min assist           General transfer comment: Min A to power up. Cues for hand placement.    Ambulation/Gait Ambulation/Gait assistance: Supervision, +2 safety/equipment Gait Distance (Feet): 200 Feet Assistive device: Rolling walker (2 wheels) Gait Pattern/deviations: Decreased stride length, Step-through pattern Gait velocity: decreased     General Gait Details: Slowed step through pattern. no LOB noted. Family member assisted with lines and leads.   Stairs             Wheelchair Mobility     Tilt Bed    Modified Rankin (Stroke Patients Only)       Balance Overall balance assessment: Needs assistance Sitting-balance support: Bilateral upper extremity supported, Feet supported Sitting balance-Leahy Scale: Good     Standing balance support: Bilateral upper extremity supported, During functional activity Standing balance-Leahy Scale: Fair Standing balance comment: Reliant on RW                            Communication Communication Communication: No apparent difficulties  Cognition Arousal: Alert Behavior During Therapy: WFL for tasks assessed/performed   PT - Cognitive impairments: No apparent impairments                         Following commands: Intact      Cueing Cueing Techniques: Verbal cues  Exercises      General Comments General comments (skin integrity, edema, etc.): VSS  Pertinent Vitals/Pain Pain Assessment Pain Assessment: Faces Faces Pain Scale: Hurts a little bit Pain Location: abdomen Pain Descriptors / Indicators: Discomfort Pain Intervention(s): Monitored during session, Limited activity within patient's tolerance, Repositioned    Home Living                          Prior  Function            PT Goals (current goals can now be found in the care plan section) Progress towards PT goals: Progressing toward goals    Frequency    Min 3X/week      PT Plan      Co-evaluation              AM-PAC PT 6 Clicks Mobility   Outcome Measure  Help needed turning from your back to your side while in a flat bed without using bedrails?: A Little Help needed moving from lying on your back to sitting on the side of a flat bed without using bedrails?: A Little Help needed moving to and from a bed to a chair (including a wheelchair)?: A Little Help needed standing up from a chair using your arms (e.g., wheelchair or bedside chair)?: A Little Help needed to walk in hospital room?: A Little Help needed climbing 3-5 steps with a railing? : A Little 6 Click Score: 18    End of Session Equipment Utilized During Treatment: Gait belt Activity Tolerance: Patient tolerated treatment well Patient left: with call bell/phone within reach;in bed;with family/visitor present Nurse Communication: Mobility status PT Visit Diagnosis: Other abnormalities of gait and mobility (R26.89)     Time: 8592-8574 PT Time Calculation (min) (ACUTE ONLY): 18 min  Charges:    $Gait Training: 8-22 mins PT General Charges $$ ACUTE PT VISIT: 1 Visit                     Sueellen NOVAK, PT, DPT Acute Rehab Services [phone]    Angela [name] 08/15/2023, 4:28 PM

## 2023-08-15 NOTE — Progress Notes (Signed)
 PROGRESS NOTE        PATIENT DETAILS Name: Angela [name] Age: 73 y.o. Sex: female Date of Birth: 07-02-1950 Admit Date: 07/30/2023 Admitting Physician Maximino DELENA Sharps, MD ERE:[id number], [name], DO  Brief Summary: Patient is a 73 y.o.  female with history of HTN, HLD who presented with worsening abdominal pain-patient was found to have sigmoid perforation with feculent peritonitis in the setting of stercoral colitis.  Patient underwent sigmoidectomy with end colostomy on 6/22-unfortunately postop course complicated by ileus and pelvic residual abscesses requiring IR drain placement.    Significant events: 6/21>> evaluated in the ED for abdominal pain-found to have left mid ureteral stone-9 mm with hydronephrosis-and large volume of stool-managed with supportive care and discharged home. 6/22>> worsening abdominal pain-repeat CT scan consistent with bowel perforation-surgery consult-admit to [facility]. 7/04>> vomiting-recurrent ileus-NG tube reinserted 7/05>> TNA started 7/7>> IR placed RLQ drain and right transgluteal pelvic drain.  Significant studies: 6/22>> CT renal stone study: Interval development of intraperitoneal free air-moderate volume free fluid-consistent with bowel perforation, persistent large volume stool with dilated sigmoid colon.  Similar location of left ureteral stone-9 mm. 6/29>> CT abdomen/pelvis: S/p partial colectomy-LLQ ostomy-complex free fluid with multiple foci of air-most pronounced in the surgical bed-possibility of superimposed infection or bowel leak cannot be excluded. 6/30>> echo: EF 60-65%. 7/05>> CT abdomen/pelvis: Previous collection with pigtail has increased in size, other less well-defined but enlarging peritoneal fluid collections.  Persistent-partially obstructing 11 mm calculus in the distal left ureter.  Persistent fluid loops of dilated small bowel-likely ileus.  Significant microbiology data: 6/22>> urine culture: No  growth 6/22>> blood culture: No growth 6/30>> pelvic abscess culture: Gram-negative rods/gram-positive cocci-mixed anaerobic flora. 7/07>> Pelvic abscess culture:Pending  Procedures: 6/22>> sigmoidectomy with end colostomy. 6/30>> CT-guided drain and pelvic abscess by IR  Consults: General Surgery IR Urology  Subjective: Less abdominal pain today-finally slept last night.  Objective: Vitals: Blood pressure (!) 147/92, pulse 77, temperature (!) 97.5 F (36.4 C), temperature source Oral, resp. rate (!) 22, height 5' 4 (1.626 m), weight 60 kg, SpO2 94%.   Exam: Awake/alert Chest: Clear to auscultation CVS: S1-S2 regular Abdomen: Soft slightly distended but much improved-empty ostomy bag this morning Extremities:+ Edema Nonfocal exam  Pertinent Labs/Radiology:    Latest Ref Rng & Units 08/15/2023    4:31 AM 08/14/2023    1:30 AM 08/12/2023    4:01 AM  CBC  WBC 4.0 - 10.5 K/uL 5.7  5.9  6.7   Hemoglobin 12.0 - 15.0 g/dL 7.3  7.2  9.0   Hematocrit 36.0 - 46.0 % 23.2  23.2  27.1   Platelets 150 - 400 K/uL 557  639  787     Lab Results  Component Value Date   NA 137 08/15/2023   K 4.0 08/15/2023   CL 105 08/15/2023   CO2 27 08/15/2023     Assessment/Plan: Sepsis secondary to stercoral colitis with ischemia/perforation of sigmoid colon with feculent peritonitis-s/p sigmoid colectomy/ostomy on 6/22-complicated by postoperative ileus and then residual pelvic abscess requiring CT-guided drain placement by on 6/30. Unchanged-remains n.p.o.-NG tube in place-TNA ongoing since 7/5 -IR consulted due to enlarging pelvic collections on repeat CT-subsequently RLQ drain and transgluteal drain placed on 7/7 Remains on Zosyn   Coffee-ground NG tube emesis Seems to have resolved-no longer coffee-ground emesis Continue PPI Hb essentially unchanged-continue to follow closely If overt  GI bleeding occurs-will need to get GI input.  If hemoglobin drops significantly-needs PRBC  transfusion. Continue to hold Lovenox  for another day or so.  Hydronephrosis secondary to distal left ureteral stone Urology following-with recommendations for supportive care for now-once her medical problems have stabilized-urology will arrange for lithotripsy versus stent placement. Continue to monitor renal function closely  Hospital delirium Secondary to acute illness/narcotics Completely awake/alert this morning Will need to be cautious with narcotics/other medications-but she has enlarging residual abscesses in her belly-and is requiring narcotics for pain control.  She is also n.p.o.-limited choices at this point. Delirium precautions  Normocytic anemia Due to acute illness,+/- coffee-ground emesis on 7/7 Hb essentially unchanged Follow and transfuse if significant drop.  Thrombocytosis Likely reactive Supportive care  Hypomagnesemia/hypophosphatemia/hypokalemia Secondary to NG tube suctioning Replete per pharmacy protocol-as patient on TNA  HTN BP stable-all oral antihypertensives on hold due to n.p.o. status Continue with as needed IV hydralazine  and metoprolol .   Lower extremity edema Secondary to hypoalbuminemia/IV fluid resuscitation in the setting of acute illness Echo stable-UA negative for proteinuria Will start diuretics once she is a bit more stable.  Chronic idiopathic constipation Bowel function recovering-brown stools Once more stable-will need to be placed on a bowel regimen.  Odynophagia secondary to oral/aphthous ulcers Improved on Valtrex and Magic mouthwash Supportive care  Glaucoma Timolol  eyedrops  Debility/deconditioning PT OT eval-Home health recommended.  Code status:   Code Status: Full Code   DVT Prophylaxis: Place TED hose Start: 08/09/23 1000 SCDs Start: 07/30/23 1954 SCDs Start: 07/30/23 1953   Family Communication: Multiple family members at bedside.   Disposition Plan: Status is: Inpatient Remains inpatient  appropriate because: Severity of illness   Planned Discharge Destination:Home health   Diet: Diet Order             Diet NPO time specified  Diet effective now                     Antimicrobial agents: Anti-infectives (From admission, onward)    Start     Dose/Rate Route Frequency Ordered Stop   08/06/23 1130  acyclovir  (ZOVIRAX ) 200 MG capsule 400 mg  Status:  Discontinued        400 mg Oral 3 times daily 08/06/23 1037 08/08/23 1424   08/06/23 1100  piperacillin -tazobactam (ZOSYN ) IVPB 3.375 g        3.375 g 12.5 mL/hr over 240 Minutes Intravenous Every 8 hours 08/06/23 1051     07/30/23 1800  piperacillin -tazobactam (ZOSYN ) IVPB 3.375 g        3.375 g 12.5 mL/hr over 240 Minutes Intravenous Every 8 hours 07/30/23 1332 08/04/23 2359   07/30/23 1400  piperacillin -tazobactam (ZOSYN ) IVPB 3.375 g  Status:  Discontinued        3.375 g 100 mL/hr over 30 Minutes Intravenous Every 8 hours 07/30/23 1329 07/30/23 1332   07/30/23 1015  piperacillin -tazobactam (ZOSYN ) IVPB 3.375 g        3.375 g 100 mL/hr over 30 Minutes Intravenous  Once 07/30/23 1013 07/30/23 1311        MEDICATIONS: Scheduled Meds:  acetaminophen   1,000 mg Per Tube Q6H   Chlorhexidine  Gluconate Cloth  6 each Topical Daily   insulin  aspart  0-6 Units Subcutaneous Q6H   latanoprost   1 drop Both Eyes QHS   magic mouthwash  5 mL Oral QID   methocarbamol   500 mg Oral TID   Or   methocarbamol  (ROBAXIN ) injection  500 mg Intravenous TID  pantoprazole  (PROTONIX ) IV  40 mg Intravenous Q12H   sodium chloride  flush  3 mL Intravenous Q12H   sodium chloride  flush  5 mL Intracatheter Q8H   sodium chloride  flush  5 mL Intracatheter Q8H   thiamine  (VITAMIN B1) injection  100 mg Intravenous Q24H   timolol   1 drop Both Eyes BID   Continuous Infusions:  0.9 % NaCl with KCl 40 mEq / L 40 mL/hr at 08/14/23 1901   magnesium  sulfate bolus IVPB 3 g (08/15/23 0922)   piperacillin -tazobactam (ZOSYN )  IV 3.375 g  (08/15/23 0923)   potassium PHOSPHATE  IVPB (in mmol) 15 mmol (08/15/23 0926)   TPN ADULT (ION) 55 mL/hr at 08/14/23 1914   TPN ADULT (ION)     PRN Meds:.albuterol , diphenhydrAMINE , hydrALAZINE , HYDROmorphone  (DILAUDID ) injection, melatonin, metoprolol  tartrate, ondansetron  **OR** ondansetron  (ZOFRAN ) IV, mouth rinse, oxyCODONE , phenol, sodium chloride  flush   I have personally reviewed following labs and imaging studies  LABORATORY DATA: CBC: Recent Labs  Lab 08/09/23 0546 08/10/23 0554 08/12/23 0401 08/14/23 0130 08/15/23 0431  WBC 9.9 7.7 6.7 5.9 5.7  HGB 8.9* 8.7* 9.0* 7.2* 7.3*  HCT 27.3* 26.6* 27.1* 23.2* 23.2*  MCV 87.2 87.5 88.0 91.3 90.6  PLT 588* 701* 787* 639* 557*    Basic Metabolic Panel: Recent Labs  Lab 08/10/23 0554 08/11/23 0505 08/12/23 0401 08/12/23 0500 08/13/23 0325 08/14/23 0130 08/15/23 0431  NA 134* 135 136  --  138 137 137  K 3.5 3.4* 4.1  --  2.8* 3.3* 4.0  CL 95* 97* 95*  --  98 100 105  CO2 26 24 30   --  31 30 27   GLUCOSE 84 86 74  --  137* 144* 124*  BUN 16 17 15   --  17 17 14   CREATININE 0.56 0.59 0.81  --  0.50 0.41* 0.43*  CALCIUM  7.8* 7.8* 8.0*  --  7.3* 7.5* 7.5*  MG 1.6* 1.9 1.7  --  1.7 1.9 1.8  PHOS 3.2  --   --  3.0 2.1* 2.0* 2.9    GFR: Estimated Creatinine Clearance: 56.5 mL/min (A) (by C-G formula based on SCr of 0.43 mg/dL (L)).  Liver Function Tests: Recent Labs  Lab 08/10/23 0554 08/11/23 0505 08/14/23 0130  AST 17 13* 13*  ALT 12 11 11   ALKPHOS 75 75 54  BILITOT 0.8 1.2 0.3  PROT 5.0* 5.2* 4.8*  ALBUMIN  1.6* 1.7* <1.5*   No results for input(s): LIPASE, AMYLASE in the last 168 hours. No results for input(s): AMMONIA in the last 168 hours.  Coagulation Profile: Recent Labs  Lab 08/15/23 0431  INR 1.1     Cardiac Enzymes: No results for input(s): CKTOTAL, CKMB, CKMBINDEX, TROPONINI in the last 168 hours.  BNP (last 3 results) No results for input(s): PROBNP in the last 8760  hours.  Lipid Profile: Recent Labs    08/14/23 0131  TRIG 129    Thyroid  Function Tests: No results for input(s): TSH, T4TOTAL, FREET4, T3FREE, THYROIDAB in the last 72 hours.  Anemia Panel: No results for input(s): VITAMINB12, FOLATE, FERRITIN, TIBC, IRON, RETICCTPCT in the last 72 hours.  Urine analysis:    Component Value Date/Time   COLORURINE STRAW (A) 08/06/2023 1123   APPEARANCEUR CLEAR 08/06/2023 1123   LABSPEC 1.009 08/06/2023 1123   PHURINE 6.0 08/06/2023 1123   GLUCOSEU NEGATIVE 08/06/2023 1123   HGBUR NEGATIVE 08/06/2023 1123   BILIRUBINUR NEGATIVE 08/06/2023 1123   KETONESUR 5 (A) 08/06/2023 1123   PROTEINUR NEGATIVE 08/06/2023 1123  NITRITE NEGATIVE 08/06/2023 1123   LEUKOCYTESUR NEGATIVE 08/06/2023 1123    Sepsis Labs: Lactic Acid, Venous    Component Value Date/Time   LATICACIDVEN 1.8 07/30/2023 1213    MICROBIOLOGY: Recent Results (from the past 240 hours)  Aerobic/Anaerobic Culture w Gram Stain (surgical/deep wound)     Status: None   Collection Time: 08/07/23  1:41 PM   Specimen: Abscess  Result Value Ref Range Status   Specimen Description ABSCESS  Final   Special Requests NONE  Final   Gram Stain   Final    ABUNDANT WBC PRESENT, PREDOMINANTLY PMN ABUNDANT GRAM NEGATIVE RODS ABUNDANT GRAM POSITIVE COCCI Performed at [facility], [address] [place], [place] [address]    Culture   Final    ABUNDANT MULTIPLE ORGANISMS PRESENT, NONE PREDOMINANT MIXED ANAEROBIC FLORA PRESENT.  CALL LAB IF FURTHER IID REQUIRED.    Report Status 08/09/2023 FINAL  Final  Aerobic/Anaerobic Culture w Gram Stain (surgical/deep wound)     Status: None (Preliminary result)   Collection Time: 08/14/23  6:13 PM   Specimen: Abscess  Result Value Ref Range Status   Specimen Description ABSCESS ABDOMEN  Final   Special Requests RLQ  Final   Gram Stain   Final    ABUNDANT WBC PRESENT, PREDOMINANTLY PMN ABUNDANT GRAM NEGATIVE  RODS ABUNDANT GRAM POSITIVE COCCI IN PAIRS    Culture   Final    TOO YOUNG TO READ Performed at [facility], [address], [place], [place] [address]    Report Status PENDING  Incomplete    RADIOLOGY STUDIES/RESULTS: CT GUIDED PERITONEAL/RETROPERITONEAL FLUID DRAIN BY PERC CATH Result Date: 08/15/2023 INDICATION: 73 year old with history of exploratory laparotomy with Hartmann's procedure for stercoral colitis with ischemia perforation. Patient has postoperative fluid collections. Previously placed right transgluteal drain has retracted and no longer appropriately positioned. EXAM: 1. CT-guided placement of right lower quadrant abdominal abscess drain 2. CT-guided placement of right transgluteal pelvic drain TECHNIQUE: Multidetector CT imaging of the abdomen and pelvis was performed following the standard protocol without IV contrast. RADIATION DOSE REDUCTION: This exam was performed according to the departmental dose-optimization program which includes automated exposure control, adjustment of the mA and/or kV according to patient size and/or use of iterative reconstruction technique. MEDICATIONS: The patient is currently admitted to the hospital and receiving intravenous antibiotics. The antibiotics were administered within an appropriate time frame prior to the initiation of the procedure. ANESTHESIA/SEDATION: Moderate (conscious) sedation was employed during this procedure. A total of Versed  3.5 mg and Fentanyl  175 mcg was administered intravenously by the radiology nurse. Total intra-service moderate Sedation Time: 83 minutes. The patient's level of consciousness and vital signs were monitored continuously by radiology nursing throughout the procedure under my direct supervision. COMPLICATIONS: None immediate. PROCEDURE: Informed written consent was obtained from the patient after a thorough discussion of the procedural risks, benefits and alternatives. All questions were addressed. Maximal  Sterile Barrier Technique was utilized including caps, mask, sterile gowns, sterile gloves, sterile drape, hand hygiene and skin antiseptic. A timeout was performed prior to the initiation of the procedure. Supine images were obtained of the abdomen and pelvis. Large fluid collection in the right lower quadrant was targeted. Right lower abdomen was prepped with chlorhexidine  and sterile field was created. Skin was anesthetized using 1% lidocaine . Small incision was made. Using CT guidance, an 18 gauge trocar needle was directed into the right lower quadrant fluid collection. Tan colored fluid was aspirated. Superstiff Amplatz wire was placed. Tract  was dilated to accommodate a 12 [place] multipurpose drain. Greater than 400 mL tan colored fluid was removed from this drain. The drain was pulled back into the right lower quadrant in order to optimize drainage. Follow up CT images were obtained. Drain was sutured to skin and attached to a suction bulb. Patient was rolled onto her left side and additional CT images were obtained. The existing transgluteal drain was cut and completely removed. The right buttock was prepped with chlorhexidine  and sterile field was created. Skin was anesthetized using 1% lidocaine . Small incision was made. Using CT guidance, 18 gauge trocar needle was directed into the pelvic fluid collection via a transgluteal approach. Tan colored fluid was aspirated. Superstiff Amplatz wire was placed. The tract was dilated to accommodate a 12 [place] multipurpose drain. 100 mL of tan colored fluid was removed. Drain was sutured to skin and attached to a suction bulb. FINDINGS: CT demonstrated multiple fluid collections in lower abdomen and pelvis. Large collections were in the right lower quadrant and in the pelvis. The existing transgluteal drain has retracted internally and no longer within the pelvic collection. Right lower quadrant drain was placed and greater than 400 mL of tan colored fluid was  removed. New right transgluteal pelvic drain was placed. 100 mL of tan colored fluid was removed. IMPRESSION: 1. CT-guided placement of a right lower quadrant abdominal abscess drain. 2. Removal of the old right transgluteal pelvic drain. 3. CT-guided placement of a new right transgluteal pelvic drain. Electronically Signed   By: Juliene Balder M.D.   On: 08/15/2023 10:11   CT GUIDED PERITONEAL/RETROPERITONEAL FLUID DRAIN BY PERC CATH Result Date: 08/15/2023 INDICATION: 73 year old with history of exploratory laparotomy with Hartmann's procedure for stercoral colitis with ischemia perforation. Patient has postoperative fluid collections. Previously placed right transgluteal drain has retracted and no longer appropriately positioned. EXAM: 1. CT-guided placement of right lower quadrant abdominal abscess drain 2. CT-guided placement of right transgluteal pelvic drain TECHNIQUE: Multidetector CT imaging of the abdomen and pelvis was performed following the standard protocol without IV contrast. RADIATION DOSE REDUCTION: This exam was performed according to the departmental dose-optimization program which includes automated exposure control, adjustment of the mA and/or kV according to patient size and/or use of iterative reconstruction technique. MEDICATIONS: The patient is currently admitted to the hospital and receiving intravenous antibiotics. The antibiotics were administered within an appropriate time frame prior to the initiation of the procedure. ANESTHESIA/SEDATION: Moderate (conscious) sedation was employed during this procedure. A total of Versed  3.5 mg and Fentanyl  175 mcg was administered intravenously by the radiology nurse. Total intra-service moderate Sedation Time: 83 minutes. The patient's level of consciousness and vital signs were monitored continuously by radiology nursing throughout the procedure under my direct supervision. COMPLICATIONS: None immediate. PROCEDURE: Informed written consent was  obtained from the patient after a thorough discussion of the procedural risks, benefits and alternatives. All questions were addressed. Maximal Sterile Barrier Technique was utilized including caps, mask, sterile gowns, sterile gloves, sterile drape, hand hygiene and skin antiseptic. A timeout was performed prior to the initiation of the procedure. Supine images were obtained of the abdomen and pelvis. Large fluid collection in the right lower quadrant was targeted. Right lower abdomen was prepped with chlorhexidine  and sterile field was created. Skin was anesthetized using 1% lidocaine . Small incision was made. Using CT guidance, an 18 gauge trocar needle was directed into the right lower quadrant fluid collection. Tan colored fluid was aspirated. Superstiff Amplatz wire was placed. Tract was dilated  to accommodate a 12 [place] multipurpose drain. Greater than 400 mL tan colored fluid was removed from this drain. The drain was pulled back into the right lower quadrant in order to optimize drainage. Follow up CT images were obtained. Drain was sutured to skin and attached to a suction bulb. Patient was rolled onto her left side and additional CT images were obtained. The existing transgluteal drain was cut and completely removed. The right buttock was prepped with chlorhexidine  and sterile field was created. Skin was anesthetized using 1% lidocaine . Small incision was made. Using CT guidance, 18 gauge trocar needle was directed into the pelvic fluid collection via a transgluteal approach. Tan colored fluid was aspirated. Superstiff Amplatz wire was placed. The tract was dilated to accommodate a 12 [place] multipurpose drain. 100 mL of tan colored fluid was removed. Drain was sutured to skin and attached to a suction bulb. FINDINGS: CT demonstrated multiple fluid collections in lower abdomen and pelvis. Large collections were in the right lower quadrant and in the pelvis. The existing transgluteal drain has retracted  internally and no longer within the pelvic collection. Right lower quadrant drain was placed and greater than 400 mL of tan colored fluid was removed. New right transgluteal pelvic drain was placed. 100 mL of tan colored fluid was removed. IMPRESSION: 1. CT-guided placement of a right lower quadrant abdominal abscess drain. 2. Removal of the old right transgluteal pelvic drain. 3. CT-guided placement of a new right transgluteal pelvic drain. Electronically Signed   By: Juliene Balder M.D.   On: 08/15/2023 10:11      LOS: 16 days   Donalda Applebaum, MD  [facility]    To contact the attending provider between 7A-7P or the covering provider during after hours 7P-7A, please log into the web site [email or web address] and access using universal [place] password for that web site. If you do not have the password, please call the hospital operator.  08/15/2023, 10:35 AM

## 2023-08-15 NOTE — Progress Notes (Signed)
 16 Days Post-Op   Subjective/Chief Complaint: Patient states she feels somewhat less bloated today from yesterday. Had IR drain replacement and additional drain placed yesterday afternoon   Objective: Vital signs in last 24 hours: Temp:  [97.3 F (36.3 C)-98 F (36.7 C)] 97.6 F (36.4 C) (07/08 1244) Pulse Rate:  [69-92] 69 (07/08 1244) Resp:  [15-28] 15 (07/08 1244) BP: (112-155)/(69-102) 136/69 (07/08 1244) SpO2:  [94 %-100 %] 95 % (07/08 0800) Weight:  [60 kg] 60 kg (07/08 0500) Last BM Date : 08/15/23  Intake/Output from previous day: 07/07 0701 - 07/08 0700 In: -  Out: 1465 [Urine:400; Emesis/NG output:425; Drains:640] Intake/Output this shift: No intake/output data recorded.    Lab Results:  Gen: NAD Lungs: effort nonlabored  Abd: soft, colostomy viable and with some output, liquid stool.  Mild distention, improved from yesterday. Midline wound fascia intact and no further drainage . IR drains with purulent fluid  BMET Recent Labs    08/14/23 0130 08/15/23 0431  NA 137 137  K 3.3* 4.0  CL 100 105  CO2 30 27  GLUCOSE 144* 124*  BUN 17 14  CREATININE 0.41* 0.43*  CALCIUM  7.5* 7.5*   PT/INR Recent Labs    08/15/23 0431  LABPROT 15.3*  INR 1.1   ABG No results for input(s): PHART, HCO3 in the last 72 hours.  Invalid input(s): PCO2, PO2  Studies/Results: CT GUIDED PERITONEAL/RETROPERITONEAL FLUID DRAIN BY PERC CATH Result Date: 08/15/2023 INDICATION: 73 year old with history of exploratory laparotomy with Hartmann's procedure for stercoral colitis with ischemia perforation. Patient has postoperative fluid collections. Previously placed right transgluteal drain has retracted and no longer appropriately positioned. EXAM: 1. CT-guided placement of right lower quadrant abdominal abscess drain 2. CT-guided placement of right transgluteal pelvic drain TECHNIQUE: Multidetector CT imaging of the abdomen and pelvis was performed following the standard  protocol without IV contrast. RADIATION DOSE REDUCTION: This exam was performed according to the departmental dose-optimization program which includes automated exposure control, adjustment of the mA and/or kV according to patient size and/or use of iterative reconstruction technique. MEDICATIONS: The patient is currently admitted to the hospital and receiving intravenous antibiotics. The antibiotics were administered within an appropriate time frame prior to the initiation of the procedure. ANESTHESIA/SEDATION: Moderate (conscious) sedation was employed during this procedure. A total of Versed  3.5 mg and Fentanyl  175 mcg was administered intravenously by the radiology nurse. Total intra-service moderate Sedation Time: 83 minutes. The patient's level of consciousness and vital signs were monitored continuously by radiology nursing throughout the procedure under my direct supervision. COMPLICATIONS: None immediate. PROCEDURE: Informed written consent was obtained from the patient after a thorough discussion of the procedural risks, benefits and alternatives. All questions were addressed. Maximal Sterile Barrier Technique was utilized including caps, mask, sterile gowns, sterile gloves, sterile drape, hand hygiene and skin antiseptic. A timeout was performed prior to the initiation of the procedure. Supine images were obtained of the abdomen and pelvis. Large fluid collection in the right lower quadrant was targeted. Right lower abdomen was prepped with chlorhexidine  and sterile field was created. Skin was anesthetized using 1% lidocaine . Small incision was made. Using CT guidance, an 18 gauge trocar needle was directed into the right lower quadrant fluid collection. Tan colored fluid was aspirated. Superstiff Amplatz wire was placed. Tract was dilated to accommodate a 12 [place] multipurpose drain. Greater than 400 mL tan colored fluid was removed from this drain. The drain was pulled back into the right lower  quadrant in order to  optimize drainage. Follow up CT images were obtained. Drain was sutured to skin and attached to a suction bulb. Patient was rolled onto her left side and additional CT images were obtained. The existing transgluteal drain was cut and completely removed. The right buttock was prepped with chlorhexidine  and sterile field was created. Skin was anesthetized using 1% lidocaine . Small incision was made. Using CT guidance, 18 gauge trocar needle was directed into the pelvic fluid collection via a transgluteal approach. Tan colored fluid was aspirated. Superstiff Amplatz wire was placed. The tract was dilated to accommodate a 12 [place] multipurpose drain. 100 mL of tan colored fluid was removed. Drain was sutured to skin and attached to a suction bulb. FINDINGS: CT demonstrated multiple fluid collections in lower abdomen and pelvis. Large collections were in the right lower quadrant and in the pelvis. The existing transgluteal drain has retracted internally and no longer within the pelvic collection. Right lower quadrant drain was placed and greater than 400 mL of tan colored fluid was removed. New right transgluteal pelvic drain was placed. 100 mL of tan colored fluid was removed. IMPRESSION: 1. CT-guided placement of a right lower quadrant abdominal abscess drain. 2. Removal of the old right transgluteal pelvic drain. 3. CT-guided placement of a new right transgluteal pelvic drain. Electronically Signed   By: Juliene Balder M.D.   On: 08/15/2023 10:11   CT GUIDED PERITONEAL/RETROPERITONEAL FLUID DRAIN BY PERC CATH Result Date: 08/15/2023 INDICATION: 73 year old with history of exploratory laparotomy with Hartmann's procedure for stercoral colitis with ischemia perforation. Patient has postoperative fluid collections. Previously placed right transgluteal drain has retracted and no longer appropriately positioned. EXAM: 1. CT-guided placement of right lower quadrant abdominal abscess drain 2. CT-guided  placement of right transgluteal pelvic drain TECHNIQUE: Multidetector CT imaging of the abdomen and pelvis was performed following the standard protocol without IV contrast. RADIATION DOSE REDUCTION: This exam was performed according to the departmental dose-optimization program which includes automated exposure control, adjustment of the mA and/or kV according to patient size and/or use of iterative reconstruction technique. MEDICATIONS: The patient is currently admitted to the hospital and receiving intravenous antibiotics. The antibiotics were administered within an appropriate time frame prior to the initiation of the procedure. ANESTHESIA/SEDATION: Moderate (conscious) sedation was employed during this procedure. A total of Versed  3.5 mg and Fentanyl  175 mcg was administered intravenously by the radiology nurse. Total intra-service moderate Sedation Time: 83 minutes. The patient's level of consciousness and vital signs were monitored continuously by radiology nursing throughout the procedure under my direct supervision. COMPLICATIONS: None immediate. PROCEDURE: Informed written consent was obtained from the patient after a thorough discussion of the procedural risks, benefits and alternatives. All questions were addressed. Maximal Sterile Barrier Technique was utilized including caps, mask, sterile gowns, sterile gloves, sterile drape, hand hygiene and skin antiseptic. A timeout was performed prior to the initiation of the procedure. Supine images were obtained of the abdomen and pelvis. Large fluid collection in the right lower quadrant was targeted. Right lower abdomen was prepped with chlorhexidine  and sterile field was created. Skin was anesthetized using 1% lidocaine . Small incision was made. Using CT guidance, an 18 gauge trocar needle was directed into the right lower quadrant fluid collection. Tan colored fluid was aspirated. Superstiff Amplatz wire was placed. Tract was dilated to accommodate a 12  [place] multipurpose drain. Greater than 400 mL tan colored fluid was removed from this drain. The drain was pulled back into the right lower quadrant in order to optimize drainage.  Follow up CT images were obtained. Drain was sutured to skin and attached to a suction bulb. Patient was rolled onto her left side and additional CT images were obtained. The existing transgluteal drain was cut and completely removed. The right buttock was prepped with chlorhexidine  and sterile field was created. Skin was anesthetized using 1% lidocaine . Small incision was made. Using CT guidance, 18 gauge trocar needle was directed into the pelvic fluid collection via a transgluteal approach. Tan colored fluid was aspirated. Superstiff Amplatz wire was placed. The tract was dilated to accommodate a 12 [place] multipurpose drain. 100 mL of tan colored fluid was removed. Drain was sutured to skin and attached to a suction bulb. FINDINGS: CT demonstrated multiple fluid collections in lower abdomen and pelvis. Large collections were in the right lower quadrant and in the pelvis. The existing transgluteal drain has retracted internally and no longer within the pelvic collection. Right lower quadrant drain was placed and greater than 400 mL of tan colored fluid was removed. New right transgluteal pelvic drain was placed. 100 mL of tan colored fluid was removed. IMPRESSION: 1. CT-guided placement of a right lower quadrant abdominal abscess drain. 2. Removal of the old right transgluteal pelvic drain. 3. CT-guided placement of a new right transgluteal pelvic drain. Electronically Signed   By: Juliene Balder M.D.   On: 08/15/2023 10:11    Anti-infectives: Anti-infectives (From admission, onward)    Start     Dose/Rate Route Frequency Ordered Stop   08/06/23 1130  acyclovir  (ZOVIRAX ) 200 MG capsule 400 mg  Status:  Discontinued        400 mg Oral 3 times daily 08/06/23 1037 08/08/23 1424   08/06/23 1100  piperacillin -tazobactam (ZOSYN ) IVPB  3.375 g        3.375 g 12.5 mL/hr over 240 Minutes Intravenous Every 8 hours 08/06/23 1051     07/30/23 1800  piperacillin -tazobactam (ZOSYN ) IVPB 3.375 g        3.375 g 12.5 mL/hr over 240 Minutes Intravenous Every 8 hours 07/30/23 1332 08/04/23 2359   07/30/23 1400  piperacillin -tazobactam (ZOSYN ) IVPB 3.375 g  Status:  Discontinued        3.375 g 100 mL/hr over 30 Minutes Intravenous Every 8 hours 07/30/23 1329 07/30/23 1332   07/30/23 1015  piperacillin -tazobactam (ZOSYN ) IVPB 3.375 g        3.375 g 100 mL/hr over 30 Minutes Intravenous  Once 07/30/23 1013 07/30/23 1311       Assessment/Plan: Procedure(s): LAPAROTOMY, EXPLORATORY (N/A) COLECTOMY, SIGMOID, OPEN s/p Procedure(s): LAPAROTOMY, EXPLORATORY (N/A) COLECTOMY, SIGMOID, OPEN POD 14, s/p ex lap with Hartmann's procedure for stercoral colitis with ischemia and perforation with feculent peritonitis, Dr. Polly 6/22  - N.p.o. status.  CT scan (7/5) shows collection remains sizable and has slightly enlarged compared with the previous study. This abuts the rectal suture line and a leak in this area cannot be excluded.   -Continue TNA  -Mobilizing well in hallway. -continue abx due to pelvic abscess, continue IR drains, flush per IR orders -BID dressing changes to midline wound  -WOC following -pulm toilet  - BID PPI  FEN -n.p.o./TNA VTE - lovenox  held yesterday, I think it is reasonable to restart given H/H stable today but will defer to primary team ID - [email or web address]     Nephrolithiasis - per urology,  Chronic constipation - bowel regimen HTN Glaucoma  LOS: 16 days   Discussed plan of care with patient and family at bedside.  Richerd Silversmith MD 08/15/2023

## 2023-08-16 ENCOUNTER — Other Ambulatory Visit: Payer: Self-pay | Admitting: Urology

## 2023-08-16 DIAGNOSIS — N201 Calculus of ureter: Secondary | ICD-10-CM | POA: Diagnosis not present

## 2023-08-16 DIAGNOSIS — I10 Essential (primary) hypertension: Secondary | ICD-10-CM | POA: Diagnosis not present

## 2023-08-16 DIAGNOSIS — N1330 Unspecified hydronephrosis: Secondary | ICD-10-CM | POA: Diagnosis not present

## 2023-08-16 DIAGNOSIS — K631 Perforation of intestine (nontraumatic): Secondary | ICD-10-CM | POA: Diagnosis not present

## 2023-08-16 LAB — BASIC METABOLIC PANEL WITH GFR
Anion gap: 7 (ref 5–15)
BUN: 15 mg/dL (ref 8–23)
CO2: 25 mmol/L (ref 22–32)
Calcium: 7.8 mg/dL — ABNORMAL LOW (ref 8.9–10.3)
Chloride: 103 mmol/L (ref 98–111)
Creatinine, Ser: 0.58 mg/dL (ref 0.44–1.00)
GFR, Estimated: >60 mL/min (ref >60)
Glucose, Bld: 125 mg/dL — ABNORMAL HIGH (ref 70–99)
Potassium: 4.3 mmol/L (ref 3.5–5.1)
Sodium: 135 mmol/L (ref 135–145)

## 2023-08-16 LAB — PHOSPHORUS: Phosphorus: 3.2 mg/dL (ref 2.5–4.6)

## 2023-08-16 LAB — GLUCOSE, CAPILLARY
Glucose-Capillary: 116 mg/dL — ABNORMAL HIGH (ref 70–99)
Glucose-Capillary: 118 mg/dL — ABNORMAL HIGH (ref 70–99)
Glucose-Capillary: 119 mg/dL — ABNORMAL HIGH (ref 70–99)
Glucose-Capillary: 127 mg/dL — ABNORMAL HIGH (ref 70–99)
Glucose-Capillary: 94 mg/dL (ref 70–99)

## 2023-08-16 LAB — MAGNESIUM: Magnesium: 2.2 mg/dL (ref 1.7–2.4)

## 2023-08-16 MED ORDER — TRAZODONE HCL 50 MG PO TABS
50.0000 mg | ORAL_TABLET | Freq: Every day | ORAL | Status: DC
  Administered 2023-08-17: 50 mg
  Filled 2023-08-16: qty 1

## 2023-08-16 MED ORDER — ENOXAPARIN SODIUM 40 MG/0.4ML IJ SOSY
40.0000 mg | PREFILLED_SYRINGE | INTRAMUSCULAR | Status: DC
  Administered 2023-08-17 – 2023-09-04 (×18): 40 mg via SUBCUTANEOUS
  Filled 2023-08-16 (×19): qty 0.4

## 2023-08-16 MED ORDER — TRAVASOL 10 % IV SOLN
INTRAVENOUS | Status: AC
  Filled 2023-08-16: qty 900

## 2023-08-16 MED ORDER — BISACODYL 10 MG RE SUPP
10.0000 mg | Freq: Every day | RECTAL | Status: DC
  Administered 2023-08-16 – 2023-09-03 (×15): 10 mg via RECTAL
  Filled 2023-08-16 (×18): qty 1

## 2023-08-16 NOTE — Progress Notes (Addendum)
 PROGRESS NOTE        PATIENT DETAILS Name: Angela [name] Age: 73 y.o. Sex: female Date of Birth: 1951/01/21 Admit Date: 07/30/2023 Admitting Physician Maximino DELENA Sharps, MD ERE:[id number], [name], DO  Brief Summary: Patient is a 73 y.o.  female with history of HTN, HLD who presented with worsening abdominal pain-patient was found to have sigmoid perforation with feculent peritonitis in the setting of stercoral colitis.  Patient underwent sigmoidectomy with end colostomy on 6/22-unfortunately postop course complicated by ileus and pelvic residual abscesses requiring IR drain placement.    Significant events: 6/21>> evaluated in the ED for abdominal pain-found to have left mid ureteral stone-9 mm with hydronephrosis-and large volume of stool-managed with supportive care and discharged home. 6/22>> worsening abdominal pain-repeat CT scan consistent with bowel perforation-surgery consult-admit to [facility]. 7/04>> vomiting-recurrent ileus-NG tube reinserted 7/05>> TNA started 7/7>> IR placed RLQ drain and right transgluteal pelvic drain.  Significant studies: 6/22>> CT renal stone study: Interval development of intraperitoneal free air-moderate volume free fluid-consistent with bowel perforation, persistent large volume stool with dilated sigmoid colon.  Similar location of left ureteral stone-9 mm. 6/29>> CT abdomen/pelvis: S/p partial colectomy-LLQ ostomy-complex free fluid with multiple foci of air-most pronounced in the surgical bed-possibility of superimposed infection or bowel leak cannot be excluded. 6/30>> echo: EF 60-65%. 7/05>> CT abdomen/pelvis: Previous collection with pigtail has increased in size, other less well-defined but enlarging peritoneal fluid collections.  Persistent-partially obstructing 11 mm calculus in the distal left ureter.  Persistent fluid loops of dilated small bowel-likely ileus.  Significant microbiology data: 6/22>> urine culture: No  growth 6/22>> blood culture: No growth 6/30>> pelvic abscess culture: Gram-negative rods/gram-positive cocci-mixed anaerobic flora. 7/07>> Pelvic abscess culture:moderate proteus and enterococcus  Procedures: 6/22>> sigmoidectomy with end colostomy. 6/30>> CT-guided drain and pelvic abscess by IR  Consults: General Surgery IR Urology  Subjective: Had a night last night-continues to have intermittent abdominal pain-no nausea or vomiting.  No output from ostomy per patient  Objective: Vitals: Blood pressure 136/66, pulse 74, temperature (!) 97.5 F (36.4 C), temperature source Oral, resp. rate 12, height 5' 4 (1.626 m), weight 59.6 kg, SpO2 97%.   Exam: Awake/alert CVS: S1-S2 regular Chest: Clear to auscultation Abdomen: Soft-slightly/appropriately tender-mildly distended.  Ostomy emptied this morning. Extremities:+ Edema Nonfocal exam.  Pertinent Labs/Radiology:    Latest Ref Rng & Units 08/15/2023    4:31 AM 08/14/2023    1:30 AM 08/12/2023    4:01 AM  CBC  WBC 4.0 - 10.5 K/uL 5.7  5.9  6.7   Hemoglobin 12.0 - 15.0 g/dL 7.3  7.2  9.0   Hematocrit 36.0 - 46.0 % 23.2  23.2  27.1   Platelets 150 - 400 K/uL 557  639  787     Lab Results  Component Value Date   NA 135 08/16/2023   K 4.3 08/16/2023   CL 103 08/16/2023   CO2 25 08/16/2023     Assessment/Plan: Sepsis secondary to stercoral colitis with ischemia/perforation of sigmoid colon with feculent peritonitis-s/p sigmoid colectomy/ostomy on 6/22-complicated by postoperative ileus and then residual pelvic abscess requiring CT-guided drain placement by on 6/30. Unchanged-remains n.p.o.-NG tube in place-TNA ongoing since 7/5 -IR consulted due to enlarging pelvic collections on repeat CT-subsequently RLQ drain and transgluteal drain placed on 7/7 Remains on Zosyn  General Surgery/IR following-await further recommendations.  Coffee-ground NG tube emesis  Seems to have resolved-no longer coffee-ground emesis-bile appearing  fluid in canister. Continue PPI Hb essentially unchanged-continue to follow closely If overt GI bleeding occurs-will need to get GI input.  If hemoglobin drops significantly-needs PRBC transfusion. Continue to hold Lovenox  for another day or so.  Hydronephrosis secondary to distal left ureteral stone Urology following-with recommendations for supportive care for now-now that her medical problems have more or less stabilized-patient is scheduled for ureteral stent placement on 7/11 noon.   Hospital delirium Secondary to acute illness/narcotics Completely awake/alert this morning Will need to be cautious with narcotics/other medications-but she has enlarging residual abscesses in her belly-and is requiring narcotics for pain control.  She is also n.p.o.-limited choices at this point. Delirium precautions  Normocytic anemia Due to acute illness,+/- coffee-ground emesis on 7/7 Hb essentially unchanged Follow and transfuse if significant drop.  Thrombocytosis Likely reactive Supportive care  Hypomagnesemia/hypophosphatemia/hypokalemia Secondary to NG tube suctioning Replete per pharmacy protocol-as patient on TNA  HTN BP stable-all oral antihypertensives on hold due to n.p.o. status Continue with as needed IV hydralazine  and metoprolol .   Lower extremity edema Secondary to hypoalbuminemia/IV fluid resuscitation in the setting of acute illness Echo stable-UA negative for proteinuria Will start diuretics once she is a bit more stable.  Chronic idiopathic constipation Bowel function recovering-brown stools Once more stable-will need to be placed on a bowel regimen.  Odynophagia secondary to oral/aphthous ulcers Improved on Valtrex and Magic mouthwash Supportive care  Glaucoma Timolol  eyedrops  Insomnia Appears to be primarily due to pain-anxiety Schedule trazodone  at night If no response-use IV Benadryl   Debility/deconditioning PT OT eval-Home health recommended.  Code  status:   Code Status: Full Code   DVT Prophylaxis: Place TED hose Start: 08/09/23 1000 SCDs Start: 07/30/23 1954 SCDs Start: 07/30/23 1953   Family Communication: Multiple family members at bedside.   Disposition Plan: Status is: Inpatient Remains inpatient appropriate because: Severity of illness   Planned Discharge Destination:Home health   Diet: Diet Order             Diet NPO time specified  Diet effective now                     Antimicrobial agents: Anti-infectives (From admission, onward)    Start     Dose/Rate Route Frequency Ordered Stop   08/06/23 1130  acyclovir  (ZOVIRAX ) 200 MG capsule 400 mg  Status:  Discontinued        400 mg Oral 3 times daily 08/06/23 1037 08/08/23 1424   08/06/23 1100  piperacillin -tazobactam (ZOSYN ) IVPB 3.375 g        3.375 g 12.5 mL/hr over 240 Minutes Intravenous Every 8 hours 08/06/23 1051     07/30/23 1800  piperacillin -tazobactam (ZOSYN ) IVPB 3.375 g        3.375 g 12.5 mL/hr over 240 Minutes Intravenous Every 8 hours 07/30/23 1332 08/04/23 2359   07/30/23 1400  piperacillin -tazobactam (ZOSYN ) IVPB 3.375 g  Status:  Discontinued        3.375 g 100 mL/hr over 30 Minutes Intravenous Every 8 hours 07/30/23 1329 07/30/23 1332   07/30/23 1015  piperacillin -tazobactam (ZOSYN ) IVPB 3.375 g        3.375 g 100 mL/hr over 30 Minutes Intravenous  Once 07/30/23 1013 07/30/23 1311        MEDICATIONS: Scheduled Meds:  acetaminophen   1,000 mg Per Tube Q6H   Chlorhexidine  Gluconate Cloth  6 each Topical Daily   latanoprost   1 drop Both Eyes QHS  magic mouthwash  5 mL Oral QID   methocarbamol   500 mg Oral TID   Or   methocarbamol  (ROBAXIN ) injection  500 mg Intravenous TID   pantoprazole  (PROTONIX ) IV  40 mg Intravenous Q12H   sodium chloride  flush  3 mL Intravenous Q12H   sodium chloride  flush  5 mL Intracatheter Q8H   sodium chloride  flush  5 mL Intracatheter Q8H   thiamine  (VITAMIN B1) injection  100 mg Intravenous Q24H    timolol   1 drop Both Eyes BID   Continuous Infusions:  piperacillin -tazobactam (ZOSYN )  IV 3.375 g (08/16/23 1007)   TPN ADULT (ION) 75 mL/hr at 08/15/23 1751   TPN ADULT (ION)     PRN Meds:.albuterol , diphenhydrAMINE , hydrALAZINE , HYDROmorphone  (DILAUDID ) injection, melatonin, metoprolol  tartrate, ondansetron  **OR** ondansetron  (ZOFRAN ) IV, mouth rinse, oxyCODONE , phenol, sodium chloride  flush   I have personally reviewed following labs and imaging studies  LABORATORY DATA: CBC: Recent Labs  Lab 08/10/23 0554 08/12/23 0401 08/14/23 0130 08/15/23 0431  WBC 7.7 6.7 5.9 5.7  HGB 8.7* 9.0* 7.2* 7.3*  HCT 26.6* 27.1* 23.2* 23.2*  MCV 87.5 88.0 91.3 90.6  PLT 701* 787* 639* 557*    Basic Metabolic Panel: Recent Labs  Lab 08/12/23 0401 08/12/23 0500 08/13/23 0325 08/14/23 0130 08/15/23 0431 08/16/23 0350  NA 136  --  138 137 137 135  K 4.1  --  2.8* 3.3* 4.0 4.3  CL 95*  --  98 100 105 103  CO2 30  --  31 30 27 25   GLUCOSE 74  --  137* 144* 124* 125*  BUN 15  --  17 17 14 15   CREATININE 0.81  --  0.50 0.41* 0.43* 0.58  CALCIUM  8.0*  --  7.3* 7.5* 7.5* 7.8*  MG 1.7  --  1.7 1.9 1.8 2.2  PHOS  --  3.0 2.1* 2.0* 2.9 3.2    GFR: Estimated Creatinine Clearance: 56.5 mL/min (by C-G formula based on SCr of 0.58 mg/dL).  Liver Function Tests: Recent Labs  Lab 08/10/23 0554 08/11/23 0505 08/14/23 0130  AST 17 13* 13*  ALT 12 11 11   ALKPHOS 75 75 54  BILITOT 0.8 1.2 0.3  PROT 5.0* 5.2* 4.8*  ALBUMIN  1.6* 1.7* <1.5*   No results for input(s): LIPASE, AMYLASE in the last 168 hours. No results for input(s): AMMONIA in the last 168 hours.  Coagulation Profile: Recent Labs  Lab 08/15/23 0431  INR 1.1     Cardiac Enzymes: No results for input(s): CKTOTAL, CKMB, CKMBINDEX, TROPONINI in the last 168 hours.  BNP (last 3 results) No results for input(s): PROBNP in the last 8760 hours.  Lipid Profile: Recent Labs    08/14/23 0131  TRIG 129     Thyroid  Function Tests: No results for input(s): TSH, T4TOTAL, FREET4, T3FREE, THYROIDAB in the last 72 hours.  Anemia Panel: No results for input(s): VITAMINB12, FOLATE, FERRITIN, TIBC, IRON, RETICCTPCT in the last 72 hours.  Urine analysis:    Component Value Date/Time   COLORURINE STRAW (A) 08/06/2023 1123   APPEARANCEUR CLEAR 08/06/2023 1123   LABSPEC 1.009 08/06/2023 1123   PHURINE 6.0 08/06/2023 1123   GLUCOSEU NEGATIVE 08/06/2023 1123   HGBUR NEGATIVE 08/06/2023 1123   BILIRUBINUR NEGATIVE 08/06/2023 1123   KETONESUR 5 (A) 08/06/2023 1123   PROTEINUR NEGATIVE 08/06/2023 1123   NITRITE NEGATIVE 08/06/2023 1123   LEUKOCYTESUR NEGATIVE 08/06/2023 1123    Sepsis Labs: Lactic Acid, Venous    Component Value Date/Time   LATICACIDVEN 1.8  07/30/2023 1213    MICROBIOLOGY: Recent Results (from the past 240 hours)  Aerobic/Anaerobic Culture w Gram Stain (surgical/deep wound)     Status: None   Collection Time: 08/07/23  1:41 PM   Specimen: Abscess  Result Value Ref Range Status   Specimen Description ABSCESS  Final   Special Requests NONE  Final   Gram Stain   Final    ABUNDANT WBC PRESENT, PREDOMINANTLY PMN ABUNDANT GRAM NEGATIVE RODS ABUNDANT GRAM POSITIVE COCCI Performed at [facility], [address] [place], [place] [address]    Culture   Final    ABUNDANT MULTIPLE ORGANISMS PRESENT, NONE PREDOMINANT MIXED ANAEROBIC FLORA PRESENT.  CALL LAB IF FURTHER IID REQUIRED.    Report Status 08/09/2023 FINAL  Final  Aerobic/Anaerobic Culture w Gram Stain (surgical/deep wound)     Status: None (Preliminary result)   Collection Time: 08/14/23  6:13 PM   Specimen: Abscess  Result Value Ref Range Status   Specimen Description ABSCESS ABDOMEN  Final   Special Requests RLQ  Final   Gram Stain   Final    ABUNDANT WBC PRESENT, PREDOMINANTLY PMN ABUNDANT GRAM NEGATIVE RODS ABUNDANT GRAM POSITIVE COCCI IN PAIRS    Culture   Final     MODERATE PROTEUS MIRABILIS MODERATE ENTEROCOCCUS FAECALIS SUSCEPTIBILITIES TO FOLLOW Performed at [facility], [address] [place], [place] [address]    Report Status PENDING  Incomplete    RADIOLOGY STUDIES/RESULTS: CT GUIDED PERITONEAL/RETROPERITONEAL FLUID DRAIN BY PERC CATH Result Date: 08/15/2023 INDICATION: 73 year old with history of exploratory laparotomy with Hartmann's procedure for stercoral colitis with ischemia perforation. Patient has postoperative fluid collections. Previously placed right transgluteal drain has retracted and no longer appropriately positioned. EXAM: 1. CT-guided placement of right lower quadrant abdominal abscess drain 2. CT-guided placement of right transgluteal pelvic drain TECHNIQUE: Multidetector CT imaging of the abdomen and pelvis was performed following the standard protocol without IV contrast. RADIATION DOSE REDUCTION: This exam was performed according to the departmental dose-optimization program which includes automated exposure control, adjustment of the mA and/or kV according to patient size and/or use of iterative reconstruction technique. MEDICATIONS: The patient is currently admitted to the hospital and receiving intravenous antibiotics. The antibiotics were administered within an appropriate time frame prior to the initiation of the procedure. ANESTHESIA/SEDATION: Moderate (conscious) sedation was employed during this procedure. A total of Versed  3.5 mg and Fentanyl  175 mcg was administered intravenously by the radiology nurse. Total intra-service moderate Sedation Time: 83 minutes. The patient's level of consciousness and vital signs were monitored continuously by radiology nursing throughout the procedure under my direct supervision. COMPLICATIONS: None immediate. PROCEDURE: Informed written consent was obtained from the patient after a thorough discussion of the procedural risks, benefits and alternatives. All questions were addressed. Maximal  Sterile Barrier Technique was utilized including caps, mask, sterile gowns, sterile gloves, sterile drape, hand hygiene and skin antiseptic. A timeout was performed prior to the initiation of the procedure. Supine images were obtained of the abdomen and pelvis. Large fluid collection in the right lower quadrant was targeted. Right lower abdomen was prepped with chlorhexidine  and sterile field was created. Skin was anesthetized using 1% lidocaine . Small incision was made. Using CT guidance, an 18 gauge trocar needle was directed into the right lower quadrant fluid collection. Tan colored fluid was aspirated. Superstiff Amplatz wire was placed. Tract was dilated to accommodate a 12 [place] multipurpose drain. Greater than 400 mL tan colored fluid was removed from this drain. The drain  was pulled back into the right lower quadrant in order to optimize drainage. Follow up CT images were obtained. Drain was sutured to skin and attached to a suction bulb. Patient was rolled onto her left side and additional CT images were obtained. The existing transgluteal drain was cut and completely removed. The right buttock was prepped with chlorhexidine  and sterile field was created. Skin was anesthetized using 1% lidocaine . Small incision was made. Using CT guidance, 18 gauge trocar needle was directed into the pelvic fluid collection via a transgluteal approach. Tan colored fluid was aspirated. Superstiff Amplatz wire was placed. The tract was dilated to accommodate a 12 [place] multipurpose drain. 100 mL of tan colored fluid was removed. Drain was sutured to skin and attached to a suction bulb. FINDINGS: CT demonstrated multiple fluid collections in lower abdomen and pelvis. Large collections were in the right lower quadrant and in the pelvis. The existing transgluteal drain has retracted internally and no longer within the pelvic collection. Right lower quadrant drain was placed and greater than 400 mL of tan colored fluid was  removed. New right transgluteal pelvic drain was placed. 100 mL of tan colored fluid was removed. IMPRESSION: 1. CT-guided placement of a right lower quadrant abdominal abscess drain. 2. Removal of the old right transgluteal pelvic drain. 3. CT-guided placement of a new right transgluteal pelvic drain. Electronically Signed   By: Juliene Balder M.D.   On: 08/15/2023 10:11   CT GUIDED PERITONEAL/RETROPERITONEAL FLUID DRAIN BY PERC CATH Result Date: 08/15/2023 INDICATION: 73 year old with history of exploratory laparotomy with Hartmann's procedure for stercoral colitis with ischemia perforation. Patient has postoperative fluid collections. Previously placed right transgluteal drain has retracted and no longer appropriately positioned. EXAM: 1. CT-guided placement of right lower quadrant abdominal abscess drain 2. CT-guided placement of right transgluteal pelvic drain TECHNIQUE: Multidetector CT imaging of the abdomen and pelvis was performed following the standard protocol without IV contrast. RADIATION DOSE REDUCTION: This exam was performed according to the departmental dose-optimization program which includes automated exposure control, adjustment of the mA and/or kV according to patient size and/or use of iterative reconstruction technique. MEDICATIONS: The patient is currently admitted to the hospital and receiving intravenous antibiotics. The antibiotics were administered within an appropriate time frame prior to the initiation of the procedure. ANESTHESIA/SEDATION: Moderate (conscious) sedation was employed during this procedure. A total of Versed  3.5 mg and Fentanyl  175 mcg was administered intravenously by the radiology nurse. Total intra-service moderate Sedation Time: 83 minutes. The patient's level of consciousness and vital signs were monitored continuously by radiology nursing throughout the procedure under my direct supervision. COMPLICATIONS: None immediate. PROCEDURE: Informed written consent was  obtained from the patient after a thorough discussion of the procedural risks, benefits and alternatives. All questions were addressed. Maximal Sterile Barrier Technique was utilized including caps, mask, sterile gowns, sterile gloves, sterile drape, hand hygiene and skin antiseptic. A timeout was performed prior to the initiation of the procedure. Supine images were obtained of the abdomen and pelvis. Large fluid collection in the right lower quadrant was targeted. Right lower abdomen was prepped with chlorhexidine  and sterile field was created. Skin was anesthetized using 1% lidocaine . Small incision was made. Using CT guidance, an 18 gauge trocar needle was directed into the right lower quadrant fluid collection. Tan colored fluid was aspirated. Superstiff Amplatz wire was placed. Tract was dilated to accommodate a 12 [place] multipurpose drain. Greater than 400 mL tan colored fluid was removed from this drain. The drain was pulled  back into the right lower quadrant in order to optimize drainage. Follow up CT images were obtained. Drain was sutured to skin and attached to a suction bulb. Patient was rolled onto her left side and additional CT images were obtained. The existing transgluteal drain was cut and completely removed. The right buttock was prepped with chlorhexidine  and sterile field was created. Skin was anesthetized using 1% lidocaine . Small incision was made. Using CT guidance, 18 gauge trocar needle was directed into the pelvic fluid collection via a transgluteal approach. Tan colored fluid was aspirated. Superstiff Amplatz wire was placed. The tract was dilated to accommodate a 12 [place] multipurpose drain. 100 mL of tan colored fluid was removed. Drain was sutured to skin and attached to a suction bulb. FINDINGS: CT demonstrated multiple fluid collections in lower abdomen and pelvis. Large collections were in the right lower quadrant and in the pelvis. The existing transgluteal drain has retracted  internally and no longer within the pelvic collection. Right lower quadrant drain was placed and greater than 400 mL of tan colored fluid was removed. New right transgluteal pelvic drain was placed. 100 mL of tan colored fluid was removed. IMPRESSION: 1. CT-guided placement of a right lower quadrant abdominal abscess drain. 2. Removal of the old right transgluteal pelvic drain. 3. CT-guided placement of a new right transgluteal pelvic drain. Electronically Signed   By: Juliene Balder M.D.   On: 08/15/2023 10:11      LOS: 17 days   Donalda Applebaum, MD  [facility]    To contact the attending provider between 7A-7P or the covering provider during after hours 7P-7A, please log into the web site [email or web address] and access using universal [place] password for that web site. If you do not have the password, please call the hospital operator.  08/16/2023, 12:00 PM

## 2023-08-16 NOTE — Plan of Care (Signed)
  Problem: Education: Goal: Knowledge of General Education information will improve Description: Including pain rating scale, medication(s)/side effects and non-pharmacologic comfort measures Outcome: Progressing   Problem: Health Behavior/Discharge Planning: Goal: Ability to manage health-related needs will improve Outcome: Progressing   Problem: Clinical Measurements: Goal: Will remain free from infection Outcome: Progressing Goal: Diagnostic test results will improve Outcome: Progressing   Problem: Activity: Goal: Risk for activity intolerance will decrease Outcome: Progressing   Problem: Nutrition: Goal: Adequate nutrition will be maintained Outcome: Progressing   Problem: Coping: Goal: Level of anxiety will decrease Outcome: Progressing

## 2023-08-16 NOTE — Progress Notes (Signed)
 PHARMACY - TOTAL PARENTERAL NUTRITION CONSULT NOTE  Indication: intolerance to feeding, recurrent ileus  Patient Measurements: Height: 5' 4 (162.6 cm) Weight: 59.6 kg (131 lb 6.3 oz) IBW/kg (Calculated) : 54.7 TPN AdjBW (KG): 57.2 Body mass index is 22.55 kg/m. Usual Weight: ~59 kg  Assessment:  72 YOF s/p exp lap with Hartmann's procedure for stercoral colitis with ischemia and perforation with feculent peritonitis on 6/22. Noted pt has been NPO/clear liquid/full liquid diet since after surgery with limited intake. Pt now with ileus on Xray (dilated loops of small bowel) and N/V with liquid diet so NGT replaced 7/4, now NPO and starting TPN. Pt at risk of refeeding with prolonged period of little po intake since admission.  Glucose / Insulin : no hx DM - CBGs < 150.  No SSI given. Electrolytes: Na 135, K 4.3 (goal; >/= 4 for ileus), Cl 103, Ca 7.8 [CoCa 9.88], Mag 2.2 (goal >/= 2 for ileus), Phos 3.2 Renal: SCr < 1, BUN WNL Hepatic: LFTs / TG / tbili WNL, albumin  < 1.5 [LFT's from 7/7] Intake / Output; MIVF: UOP 1.5 ml/kg/hr +1 unmeasured, NG/OG , IR drain , colostomy 0mL GI Imaging: 7/5 abd xray - persistent dilated loops of small bowel 7/5 CT - pelvic fluid collection slightly enlarged, suspicious for peritonitis, partially obstructing calculus in L ureter, ileus GI Surgeries / Procedures:  6/22 ex-lap with Hartmann's procedure for stercoral colitis with ischemia and perf with feculent peritonitis 7/8 CT-guided placement RLQ abd abscess drain [>457mL tan colored fluid removed]  Central access: PICC placed 08/12/23 TPN start date: 08/12/23  Nutritional Goals: Goal TPN (AA 50g/L, CHO 15%, ILE 25g/L) at 75 ml/hr will provide 90g AA, 270g CHO, 45g ILE, 1728 kCal and sterile water  RD Estimated Needs Total Energy Estimated Needs: 1650-1850 kcals Total Protein Estimated Needs: 80-90 g Total Fluid Estimated Needs: >/= 1.7 L  Current Nutrition:  TPN  Plan:  Continue  TPN at goal rate of 75 ml/hr to provide 100% of needs Electrolytes in TPN: Na 61mEq/L, K 56mEq/L, Ca 31mEq/L, Mag 50mEq/L, Phos 15mmol/L, Cl:Ac 1:1 Add standard MVI and trace elements to TPN Discontinue  vsSSI Thiamine  100mg  IV daily outside of TPN x 6 days (7/5-7/10) Monitor TPN labs on Mon/Thurs - labs in AM   Miquela Costabile BS, PharmD, BCPS Clinical Pharmacist 08/16/2023 8:08 AM  Contact: [phone] after 3 PM  Be curious, not judgmental... -Davina Sprinkles

## 2023-08-16 NOTE — Progress Notes (Signed)
 Referring Physician(s): Dr. Richerd Silversmith   Supervising Physician: Jennefer Rover  Patient Status:  [facility] - In-pt  Chief Complaint:  Pelvic fluid collection   Subjective: She is lying in bed with sister at bedside. She feels extremely fatigued and general pain and discomfort from recent major abd surgeries and current kidney stone (Urologist at bedside during time of this exam notes that she will likely return to OR to address this Friday). She is encouraged that the suction bulbs now have output and appear to be working.  Brief Hospital Course: Angela [name] is a 73 y.o. female with a h/o HTN, HLD, glaucoma who recently underwent ex lap with sigmoidectomy and Hartmann's procedure on 6/22 with Dr. KANDICE Idler. She was started on IV antibiotics, her diet was gradually advanced, and patient initially seemed to be recovering well, but CT on 6/29 revealed complex free fluid w/ air in the surgical bed concerning for superimposed infection vs bowel leak. IR consulted for percutaneous drain placement.   Dr. Philip placed a drain into the fluid collection on 6/30.   A CT on 7/5 was notable for enlarged fluid collection. Found that TG drain was not having any output despite image read that it appeared in place.   Patient returned to IR 7/7. Dr. Philip found that drain had become retracted. He removed the old TG drain and replaced it with two drains: R TG 12Fr and RLQ 12 Fr.    Allergies: Codeine  Medications: Prior to Admission medications   Medication Sig Start Date End Date Taking? Authorizing Provider  albuterol  (VENTOLIN  HFA) 108 (90 Base) MCG/ACT inhaler Inhale 2 puffs into the lungs every 6 (six) hours as needed for wheezing or shortness of breath. 03/03/22  Yes Kuneff, Renee A, DO  Calcium  Polycarbophil (FIBER-CAPS PO) Take 1 capsule by mouth daily at 12 noon.   Yes [provider]  HYDROcodone -acetaminophen  (NORCO/VICODIN) 5-325 MG tablet Take 1 tablet by mouth every 6 (six)  hours as needed for moderate pain (pain score 4-6). 07/29/23  Yes Zackowski, Scott, MD  hydrocortisone  (ANUSOL -HC) 25 MG suppository Place 1 suppository (25 mg total) rectally 2 (two) times daily. 06/23/23  Yes Kuneff, Renee A, DO  hydrocortisone  cream 1 % Apply 1 Application topically 2 (two) times daily. 06/08/23  Yes Kuneff, Renee A, DO  latanoprost  (XALATAN ) 0.005 % ophthalmic solution Place 1 drop into both eyes at bedtime. 08/11/17  Yes [provider]  LINZESS 72 MCG capsule Take 72 mcg by mouth daily before breakfast. 06/26/23  Yes [provider]  lisinopril  (ZESTRIL ) 10 MG tablet TAKE 1 TABLET BY MOUTH DAILY 05/22/23  Yes Kuneff, Renee A, DO  Multiple Vitamin (MULTIVITAMIN) tablet Take 1 tablet by mouth daily.   Yes [provider]  ondansetron  (ZOFRAN -ODT) 4 MG disintegrating tablet Take 1 tablet (4 mg total) by mouth every 8 (eight) hours as needed for nausea or vomiting. 07/29/23  Yes Zackowski, Scott, MD  Probiotic Product (DAILY PROBIOTIC PO) Take 1 capsule by mouth daily at 12 noon.   Yes [provider]  timolol  (TIMOPTIC ) 0.5 % ophthalmic solution Place 1 drop into both eyes 2 (two) times daily. 11/14/19  Yes [provider]  amoxicillin -clavulanate (AUGMENTIN ) 875-125 MG tablet Take 1 tablet by mouth every 12 (twelve) hours. Patient not taking: Reported on 07/31/2023 07/29/23   Zackowski, Scott, MD  atorvastatin  (LIPITOR) 20 MG tablet Take 1 tablet (20 mg total) by mouth at bedtime. Patient not taking: Reported on 02/22/2023 11/30/21  Kuneff, Renee A, DO     Vital Signs: BP 136/66 (BP Location: Left Arm)   Pulse 74   Temp (!) 97.5 F (36.4 C) (Oral)   Resp 12   Ht 5' 4 (1.626 m)   Wt 131 lb 6.3 oz (59.6 kg)   SpO2 97%   BMI 22.55 kg/m   Physical Exam Cardiovascular:     Rate and Rhythm: Normal rate.  Pulmonary:     Effort: Pulmonary effort is normal.  Abdominal:     General: A surgical scar is present.     Palpations: Abdomen is  soft.     Tenderness: There is generalized abdominal tenderness.     Comments: Midline surgical dressing intact. RLQ drain insertion site unremarkable, ext suture intact. Thin, tan fluid in collection bulb. Appropriately charged.   RLQ drain insertion site unremarkable, ext suture intact. Thin, tan fluid in collection bulb. Appropriately charged.   Skin:    General: Skin is warm and dry.  Neurological:     Mental Status: She is alert and oriented to person, place, and time.  Psychiatric:        Mood and Affect: Mood normal.        Behavior: Behavior normal.     Imaging: CT GUIDED PERITONEAL/RETROPERITONEAL FLUID DRAIN BY PERC CATH Result Date: 08/15/2023 INDICATION: 73 year old with history of exploratory laparotomy with Hartmann's procedure for stercoral colitis with ischemia perforation. Patient has postoperative fluid collections. Previously placed right transgluteal drain has retracted and no longer appropriately positioned. EXAM: 1. CT-guided placement of right lower quadrant abdominal abscess drain 2. CT-guided placement of right transgluteal pelvic drain TECHNIQUE: Multidetector CT imaging of the abdomen and pelvis was performed following the standard protocol without IV contrast. RADIATION DOSE REDUCTION: This exam was performed according to the departmental dose-optimization program which includes automated exposure control, adjustment of the mA and/or kV according to patient size and/or use of iterative reconstruction technique. MEDICATIONS: The patient is currently admitted to the hospital and receiving intravenous antibiotics. The antibiotics were administered within an appropriate time frame prior to the initiation of the procedure. ANESTHESIA/SEDATION: Moderate (conscious) sedation was employed during this procedure. A total of Versed  3.5 mg and Fentanyl  175 mcg was administered intravenously by the radiology nurse. Total intra-service moderate Sedation Time: 83 minutes. The patient's  level of consciousness and vital signs were monitored continuously by radiology nursing throughout the procedure under my direct supervision. COMPLICATIONS: None immediate. PROCEDURE: Informed written consent was obtained from the patient after a thorough discussion of the procedural risks, benefits and alternatives. All questions were addressed. Maximal Sterile Barrier Technique was utilized including caps, mask, sterile gowns, sterile gloves, sterile drape, hand hygiene and skin antiseptic. A timeout was performed prior to the initiation of the procedure. Supine images were obtained of the abdomen and pelvis. Large fluid collection in the right lower quadrant was targeted. Right lower abdomen was prepped with chlorhexidine  and sterile field was created. Skin was anesthetized using 1% lidocaine . Small incision was made. Using CT guidance, an 18 gauge trocar needle was directed into the right lower quadrant fluid collection. Tan colored fluid was aspirated. Superstiff Amplatz wire was placed. Tract was dilated to accommodate a 12 [place] multipurpose drain. Greater than 400 mL tan colored fluid was removed from this drain. The drain was pulled back into the right lower quadrant in order to optimize drainage. Follow up CT images were obtained. Drain was sutured to skin and attached to a suction bulb. Patient was rolled onto her  left side and additional CT images were obtained. The existing transgluteal drain was cut and completely removed. The right buttock was prepped with chlorhexidine  and sterile field was created. Skin was anesthetized using 1% lidocaine . Small incision was made. Using CT guidance, 18 gauge trocar needle was directed into the pelvic fluid collection via a transgluteal approach. Tan colored fluid was aspirated. Superstiff Amplatz wire was placed. The tract was dilated to accommodate a 12 [place] multipurpose drain. 100 mL of tan colored fluid was removed. Drain was sutured to skin and attached to a  suction bulb. FINDINGS: CT demonstrated multiple fluid collections in lower abdomen and pelvis. Large collections were in the right lower quadrant and in the pelvis. The existing transgluteal drain has retracted internally and no longer within the pelvic collection. Right lower quadrant drain was placed and greater than 400 mL of tan colored fluid was removed. New right transgluteal pelvic drain was placed. 100 mL of tan colored fluid was removed. IMPRESSION: 1. CT-guided placement of a right lower quadrant abdominal abscess drain. 2. Removal of the old right transgluteal pelvic drain. 3. CT-guided placement of a new right transgluteal pelvic drain. Electronically Signed   By: Juliene Balder M.D.   On: 08/15/2023 10:11   CT GUIDED PERITONEAL/RETROPERITONEAL FLUID DRAIN BY PERC CATH Result Date: 08/15/2023 INDICATION: 73 year old with history of exploratory laparotomy with Hartmann's procedure for stercoral colitis with ischemia perforation. Patient has postoperative fluid collections. Previously placed right transgluteal drain has retracted and no longer appropriately positioned. EXAM: 1. CT-guided placement of right lower quadrant abdominal abscess drain 2. CT-guided placement of right transgluteal pelvic drain TECHNIQUE: Multidetector CT imaging of the abdomen and pelvis was performed following the standard protocol without IV contrast. RADIATION DOSE REDUCTION: This exam was performed according to the departmental dose-optimization program which includes automated exposure control, adjustment of the mA and/or kV according to patient size and/or use of iterative reconstruction technique. MEDICATIONS: The patient is currently admitted to the hospital and receiving intravenous antibiotics. The antibiotics were administered within an appropriate time frame prior to the initiation of the procedure. ANESTHESIA/SEDATION: Moderate (conscious) sedation was employed during this procedure. A total of Versed  3.5 mg and  Fentanyl  175 mcg was administered intravenously by the radiology nurse. Total intra-service moderate Sedation Time: 83 minutes. The patient's level of consciousness and vital signs were monitored continuously by radiology nursing throughout the procedure under my direct supervision. COMPLICATIONS: None immediate. PROCEDURE: Informed written consent was obtained from the patient after a thorough discussion of the procedural risks, benefits and alternatives. All questions were addressed. Maximal Sterile Barrier Technique was utilized including caps, mask, sterile gowns, sterile gloves, sterile drape, hand hygiene and skin antiseptic. A timeout was performed prior to the initiation of the procedure. Supine images were obtained of the abdomen and pelvis. Large fluid collection in the right lower quadrant was targeted. Right lower abdomen was prepped with chlorhexidine  and sterile field was created. Skin was anesthetized using 1% lidocaine . Small incision was made. Using CT guidance, an 18 gauge trocar needle was directed into the right lower quadrant fluid collection. Tan colored fluid was aspirated. Superstiff Amplatz wire was placed. Tract was dilated to accommodate a 12 [place] multipurpose drain. Greater than 400 mL tan colored fluid was removed from this drain. The drain was pulled back into the right lower quadrant in order to optimize drainage. Follow up CT images were obtained. Drain was sutured to skin and attached to a suction bulb. Patient was rolled onto her left side  and additional CT images were obtained. The existing transgluteal drain was cut and completely removed. The right buttock was prepped with chlorhexidine  and sterile field was created. Skin was anesthetized using 1% lidocaine . Small incision was made. Using CT guidance, 18 gauge trocar needle was directed into the pelvic fluid collection via a transgluteal approach. Tan colored fluid was aspirated. Superstiff Amplatz wire was placed. The tract  was dilated to accommodate a 12 [place] multipurpose drain. 100 mL of tan colored fluid was removed. Drain was sutured to skin and attached to a suction bulb. FINDINGS: CT demonstrated multiple fluid collections in lower abdomen and pelvis. Large collections were in the right lower quadrant and in the pelvis. The existing transgluteal drain has retracted internally and no longer within the pelvic collection. Right lower quadrant drain was placed and greater than 400 mL of tan colored fluid was removed. New right transgluteal pelvic drain was placed. 100 mL of tan colored fluid was removed. IMPRESSION: 1. CT-guided placement of a right lower quadrant abdominal abscess drain. 2. Removal of the old right transgluteal pelvic drain. 3. CT-guided placement of a new right transgluteal pelvic drain. Electronically Signed   By: Juliene Balder M.D.   On: 08/15/2023 10:11   CT ABDOMEN PELVIS W CONTRAST Result Date: 08/12/2023 CLINICAL DATA:  Postop abdominal pain. Patient underwent exploratory laparotomy with Hartmann's procedure for stercoral colitis and perforation. Subsequent pelvic percutaneous drain placement for abscess. EXAM: CT ABDOMEN AND PELVIS WITH CONTRAST TECHNIQUE: Multidetector CT imaging of the abdomen and pelvis was performed using the standard protocol following bolus administration of intravenous contrast. RADIATION DOSE REDUCTION: This exam was performed according to the departmental dose-optimization program which includes automated exposure control, adjustment of the mA and/or kV according to patient size and/or use of iterative reconstruction technique. CONTRAST:  75mL OMNIPAQUE  IOHEXOL  350 MG/ML SOLN COMPARISON:  Abdominopelvic CT 08/06/2023. FINDINGS: Lower chest: Trace left pleural effusion and mild bibasilar atelectasis again noted, similar to previous CT. There is atherosclerosis of the aorta and coronary arteries. Hepatobiliary: The liver is normal in density without suspicious focal abnormality.  Stable hepatic cysts. No evidence of gallstones, gallbladder wall thickening or biliary dilatation. Pancreas: Unremarkable. No pancreatic ductal dilatation or surrounding inflammatory changes. Spleen: Normal in size without focal abnormality. Adrenals/Urinary Tract: Both adrenal glands appear normal. There is a persistent partially obstructing calculus in the distal left ureter, measuring 11 mm on coronal image 65/7. Resulting hydronephrosis and hydroureter with delayed contrast excretion. No evidence of right ureteral obstruction. There are small renal cysts bilaterally for which no specific follow-up imaging is recommended. The bladder appears unremarkable for its degree of distention. Stomach/Bowel: No enteric contrast administered. Nasogastric tube extends into the mid stomach. The stomach is decompressed. Multiple fluid-filled loops of dilated small bowel are again noted, without focal transition point or significant wall thickening. The appendix is not clearly visualized, although no pericecal inflammatory changes are identified. Colonic stool burden has improved compared with the previous study. The colon is relatively decompressed proximal to the sigmoid colostomy. The rectal pouch is fluid-filled and mildly distended. Vascular/Lymphatic: There are no enlarged abdominal or pelvic lymph nodes. Aortic and branch vessel atherosclerosis without evidence of aneurysm or large vessel occlusion. The portal, superior mesenteric and splenic veins are patent. Reproductive: The uterus and ovaries appear unremarkable. No evidence of adnexal mass. Other: Open midline anterior abdominal wall incision. New right transgluteal percutaneous drain appears appropriately position within the previously demonstrated complex pelvic fluid collection. However, this collection remains sizable and has  slightly enlarged compared with the previous study, measuring 8.0 x 6.7 cm on image 65/4. This collection likely communicates with an  adjacent collection along the left pelvic sidewall, measuring 7.0 x 2.9 cm on image 68/4 and abuts the rectal pouch suture line. There are other less well-defined but enlarging peritoneal fluid collections with associated air bubbles and peritoneal enhancement, highly suspicious for peritonitis. A component inferior to the cecum measures up to 8.0 x 6.0 cm on image 58/4, and there is a component inferior to the colostomy measuring 7.4 x 5.3 cm on image 52/4. Musculoskeletal: No acute or significant osseous findings. Multilevel spondylosis. No evidence of discitis or osteomyelitis. IMPRESSION: 1. New right transgluteal percutaneous drain appears appropriately positioned within the previously demonstrated complex pelvic fluid collection. However, this collection remains sizable and has slightly enlarged compared with the previous study. This abuts the rectal suture line and a leak in this area cannot be excluded. 2. There are other less well-defined but enlarging peritoneal fluid collections with associated air bubbles and peritoneal enhancement, highly suspicious for peritonitis. 3. Persistent partially obstructing 11 mm calculus in the distal left ureter with resulting hydronephrosis and hydroureter. 4. Persistent fluid-filled loops of dilated small bowel without focal transition point, likely ileus. 5. No other significant parenchymal findings. 6.  Aortic Atherosclerosis (ICD10-I70.0). 7. These results will be called to the ordering clinician or representative by the Radiologist Assistant, and communication documented in the PACS or [employer]. Electronically Signed   By: Elsie Perone M.D.   On: 08/12/2023 19:06    Labs:  CBC: Recent Labs    08/10/23 0554 08/12/23 0401 08/14/23 0130 08/15/23 0431  WBC 7.7 6.7 5.9 5.7  HGB 8.7* 9.0* 7.2* 7.3*  HCT 26.6* 27.1* 23.2* 23.2*  PLT 701* 787* 639* 557*    COAGS: Recent Labs    07/29/23 0826 08/07/23 0909 08/15/23 0431  INR 1.1 1.2 1.1     BMP: Recent Labs    08/13/23 0325 08/14/23 0130 08/15/23 0431 08/16/23 0350  NA 138 137 137 135  K 2.8* 3.3* 4.0 4.3  CL 98 100 105 103  CO2 31 30 27 25   GLUCOSE 137* 144* 124* 125*  BUN 17 17 14 15   CALCIUM  7.3* 7.5* 7.5* 7.8*  CREATININE 0.50 0.41* 0.43* 0.58  GFRNONAA >60 >60 >60 >60    LIVER FUNCTION TESTS: Recent Labs    08/06/23 0807 08/10/23 0554 08/11/23 0505 08/14/23 0130  BILITOT 0.5 0.8 1.2 0.3  AST 24 17 13* 13*  ALT 17 12 11 11   ALKPHOS 62 75 75 54  PROT 5.3* 5.0* 5.2* 4.8*  ALBUMIN  1.6* 1.6* 1.7* <1.5*    Assessment and Plan:  Drain Location: RLQ, R TG Size: Fr size: 12 Fr Date of placement: 08/14/23  Currently to: Drain collection device: suction bulb x2 24 hour output:  Output by Drain (mL) 08/14/23 0701 - 08/14/23 1900 08/14/23 1901 - 08/15/23 0700 08/15/23 0701 - 08/15/23 1900 08/15/23 1901 - 08/16/23 0700 08/16/23 0701 - 08/16/23 1133  Closed System Drain Right RLQ Bulb (JP) 12 Fr. 400 90 25 25   Closed System Drain Right Buttock Bulb (JP) 12 Fr. 100 50 50 60     Current examination: For both drains: Flushes/aspirates easily.  Insertion site unremarkable. Suture and stat lock in place. Dressed appropriately. \  Output: RLQ- thin, tan fluid TG- grey/tan  Output has been well over 10 ml from both drains. No new imaging indicated.  VSS.   Plan: Continue TID flushes  with 5 cc NS. Record output Q shift. Dressing changes QD or PRN if soiled.  Call IR APP or on call IR MD if difficulty flushing or sudden change in drain output.  Repeat imaging/possible drain injection once output < 10 mL/QD (excluding flush material). Consideration for drain removal if output is < 10 mL/QD (excluding flush material), pending discussion with the providing surgical service.  Discharge planning: Please contact IR APP or on call IR MD prior to patient d/c to ensure appropriate follow up plans are in place. Typically patient will follow up with IR clinic  10-14 days post d/c for repeat imaging/possible drain injection. IR scheduler will contact patient with date/time of appointment. Patient will need to flush drain QD with 5 cc NS, record output QD, dressing changes every 2-3 days or earlier if soiled.   IR will continue to follow - please call with questions or concerns.     Electronically Signed: Laymon Coast, NP 08/16/2023, 10:52 AM   I spent a total of 15 Minutes at the the patient's bedside AND on the patient's hospital floor or unit, greater than 50% of which was counseling/coordinating care for s/p pelvic fluid collection drain x2.

## 2023-08-16 NOTE — Progress Notes (Addendum)
 Nutrition Follow-up  DOCUMENTATION CODES:   Severe malnutrition in context of acute illness/injury  INTERVENTION:  Monitor for diet advancement and tolerance   TPN to meet estimated nutritional needs; TPN order per Pharmacy IV Thiamine  100 mg for at least 5 days (being given via IV outside TPN) to end on 7/10 Daily weights on TPN MVI added to TPN; start orally when appropriate  TPN providing: 1728 kcals and 90g protein (100% of estimated needs)   NUTRITION DIAGNOSIS:  Severe Malnutrition related to acute illness (stercoral colitis w/ ischemia and performation w/ feculent peritonitis) as evidenced by mild fat depletion, moderate fat depletion - remains applicable  GOAL:  Patient will meet greater than or equal to 90% of their needs - meeting via TPN at goal rate   MONITOR:  TF tolerance, I & O's, Diet advancement, Labs  REASON FOR ASSESSMENT:  Consult New TPN/TNA  ASSESSMENT:   73 yo female with stercoral colitis admitted on 6/22 with ischemia and perforation with feculent peritonitis requiring sigmoidectomy with end colostomy. Pt seen in ED on 6/21 with CT indicating colitis with large stool burden-discharged on antibiotics. Post-op course complicated by pelvic abscess and post-op ileus, inability to tolerate po. PMH includes chronic idiopathic constipation, HLD, HTN  6/22 Admitted, Ex Lap, open sigmoid colectomy with colostomy for stercoral colitis with ischemia and perforation with feculent peritonitis 6/23 NPO with ice chips 6/25 CL diet initiated 6/28 Advanced to [facility] diet 6/29 Back to NPO, CT A/P: complex free fluid with multiple foci of air, possible infection or bowel leak 6/30 CT-guided drainage of pelvic abscess by IR, drain left in place, ECHO EF 60-65% 7/01 Started back on CL diet 7/02 Advanced to [facility] Diet 7/03 Diet downgraded to CL  due to abdominal distention 7/04 Not tolerating CL diet, NG to LIS, NPO 7/05 Abd xray with persistent dilated loops of small bowel  within the left hemiabdomen measure up to 4.3 cm. PICC line placement with TPN initiation 7/07 Pelvic abscess culture: moderate proteus and enterococcus; IR removed old TG drain and replaced with two drains ( R TG 12Fr and RLQ 12 Fr) 7/07 CT-guided placement of abdominal abscess drain to RLQ - yield  7/08 TPN at goal rate  Abdominal fluid collection appeared to be enlarging. Patient back to IR on 7/07 where old TG drain removed and replaced with two drains. Had additional abdominal drain placed to RLQ. Both drains with thin, tan colored fluid collecting.  NGT output more bilious in appearance today. Some mild abdominal distention, however still soft. No N/V reported. Ostomy emptied this morning. Electrolytes being repleted 2/2 refeeding and TPN now at goal rate.    Admit Weight: 57.2kg  Current Weight: 59.6kg +moderate edema to BLEs   Current body weight remains close to reported UBW, however she is noted with edema. Moderate edema continues to BLEs. Likely accounts for uptick in weight since admission. True body weight likely lower than what is reported. Will continue to monitor trend as edema resolves to assess trube weight trend and update estimated needs as indicated. Dulcolax suppository ordered today.    Drains/Lines: NGT (gastric): x24 hours R basilic: PICC (placed 7/05) RLQ: JP drain (12 Fr): 50ml x24 hours LLQ: colostomy: 0ml x24 hours - ?accuracy R buttock: JP drain (12 Fr): x24 hours UOP: + 1 unmeasured occurrence  x24 hours  Refeeding labs have stabilized. Did receive potassium phosphate  yesterday. Insulin  has been discontinued.   Labs: Sodium 135 (wdl)  Potassium 3.3>4.0>4.3 (wdl) BUN/Creatinine  wdl Magnesium  2.2 (wdl) Phosphorus 2.0>2.9>3.2 (wdl) Hgb 9.0>7.2>7.3 (L) CBGs 124-125 x24 hours   Meds:  Dulcolax Protonix  Thiamine   Drips: 15 mmol potassium PHOS x1 IV ABX  Diet Order:   Diet Order             Diet NPO time specified  Diet  effective now           Diet NPO time specified  Diet effective now            EDUCATION NEEDS:   Education needs have been addressed  Skin:  Skin Assessment: Skin Integrity Issues: Skin Integrity Issues:: Incisions Incisions: abdomen (closed)  Last BM:  7/9 via ostomy  Height:  Ht Readings from Last 1 Encounters:  07/30/23 5' 4 (1.626 m)   Weight:  Wt Readings from Last 1 Encounters:  08/16/23 59.6 kg   Ideal Body Weight:  54.55 kg  BMI:  Body mass index is 22.55 kg/m.  Estimated Nutritional Needs:   Kcal:  1650-1850 kcals  Protein:  80-90 g  Fluid:  >/= 1.7 L  Blair Deaner MS, RD, LDN Registered Dietitian Clinical Nutrition RD Inpatient Contact Info in Amion

## 2023-08-16 NOTE — Plan of Care (Signed)

## 2023-08-16 NOTE — Progress Notes (Signed)
 17 Days Post-Op Subjective: Patient is alert, oriented, and in no distress.  He saw her with interventional radiology on rounds.  Objective: Vital signs in last 24 hours: Temp:  [97.3 F (36.3 C)-98.4 F (36.9 C)] 97.5 F (36.4 C) (07/09 0817) Pulse Rate:  [69-90] 74 (07/09 0817) Resp:  [12-21] 12 (07/09 0817) BP: (110-147)/(66-89) 136/66 (07/09 0817) SpO2:  [95 %-97 %] 97 % (07/09 0817) Weight:  [59.6 kg] 59.6 kg (07/09 0425)  Assessment/Plan: # 9 mm left distal ureteral stone # Multiple abdominal fluid collections  Drains placed into abdominal fluid collection on 6/30 had become retracted, as evidenced by large fluid collection on CT from 7/5.  This drain was removed and 2 more put in its place with 640 mL returned over the first 24 hours.  160 mL out yesterday.  Purulent drainage  Renal function is preserved.    Patient is scheduled tentatively for cystoscopy with ureteral stent placement and possible ureteroscopy/laser lithotripsy around noon on Friday with Dr. Lovie.   I expect that she will still be n.p.o., but if tube feeds are started before Friday, please pause at midnight  Urology will follow peripherally.  Case and plan discussed with Dr. Lovie  Intake/Output from previous day: 07/08 0701 - 07/09 0700 In: 7  Out: 2885 [Urine:2150; Emesis/NG output:575; Drains:160]  Intake/Output this shift: No intake/output data recorded.  Physical Exam:  General: Sedated CV: No cyanosis Lungs: equal chest rise GU: Purewick in place draining clear yellow urine.  Lab Results: Recent Labs    08/14/23 0130 08/15/23 0431  HGB 7.2* 7.3*  HCT 23.2* 23.2*   BMET Recent Labs    08/14/23 0130 08/15/23 0431 08/16/23 0350  NA 137 137 135  K 3.3* 4.0 4.3  CL 100 105 103  CO2 30 27 25   GLUCOSE 144* 124* 125*  BUN 17 14 15   CREATININE 0.41* 0.43* 0.58  CALCIUM  7.5* 7.5* 7.8*  HGB 7.2* 7.3*  --   WBC 5.9 5.7  --      Studies/Results: CT GUIDED  PERITONEAL/RETROPERITONEAL FLUID DRAIN BY PERC CATH Result Date: 08/15/2023 INDICATION: 73 year old with history of exploratory laparotomy with Hartmann's procedure for stercoral colitis with ischemia perforation. Patient has postoperative fluid collections. Previously placed right transgluteal drain has retracted and no longer appropriately positioned. EXAM: 1. CT-guided placement of right lower quadrant abdominal abscess drain 2. CT-guided placement of right transgluteal pelvic drain TECHNIQUE: Multidetector CT imaging of the abdomen and pelvis was performed following the standard protocol without IV contrast. RADIATION DOSE REDUCTION: This exam was performed according to the departmental dose-optimization program which includes automated exposure control, adjustment of the mA and/or kV according to patient size and/or use of iterative reconstruction technique. MEDICATIONS: The patient is currently admitted to the hospital and receiving intravenous antibiotics. The antibiotics were administered within an appropriate time frame prior to the initiation of the procedure. ANESTHESIA/SEDATION: Moderate (conscious) sedation was employed during this procedure. A total of Versed  3.5 mg and Fentanyl  175 mcg was administered intravenously by the radiology nurse. Total intra-service moderate Sedation Time: 83 minutes. The patient's level of consciousness and vital signs were monitored continuously by radiology nursing throughout the procedure under my direct supervision. COMPLICATIONS: None immediate. PROCEDURE: Informed written consent was obtained from the patient after a thorough discussion of the procedural risks, benefits and alternatives. All questions were addressed. Maximal Sterile Barrier Technique was utilized including caps, mask, sterile gowns, sterile gloves, sterile drape, hand hygiene and skin antiseptic. A timeout was performed  prior to the initiation of the procedure. Supine images were obtained of the  abdomen and pelvis. Large fluid collection in the right lower quadrant was targeted. Right lower abdomen was prepped with chlorhexidine  and sterile field was created. Skin was anesthetized using 1% lidocaine . Small incision was made. Using CT guidance, an 18 gauge trocar needle was directed into the right lower quadrant fluid collection. Tan colored fluid was aspirated. Superstiff Amplatz wire was placed. Tract was dilated to accommodate a 12 [place] multipurpose drain. Greater than 400 mL tan colored fluid was removed from this drain. The drain was pulled back into the right lower quadrant in order to optimize drainage. Follow up CT images were obtained. Drain was sutured to skin and attached to a suction bulb. Patient was rolled onto her left side and additional CT images were obtained. The existing transgluteal drain was cut and completely removed. The right buttock was prepped with chlorhexidine  and sterile field was created. Skin was anesthetized using 1% lidocaine . Small incision was made. Using CT guidance, 18 gauge trocar needle was directed into the pelvic fluid collection via a transgluteal approach. Tan colored fluid was aspirated. Superstiff Amplatz wire was placed. The tract was dilated to accommodate a 12 [place] multipurpose drain. 100 mL of tan colored fluid was removed. Drain was sutured to skin and attached to a suction bulb. FINDINGS: CT demonstrated multiple fluid collections in lower abdomen and pelvis. Large collections were in the right lower quadrant and in the pelvis. The existing transgluteal drain has retracted internally and no longer within the pelvic collection. Right lower quadrant drain was placed and greater than 400 mL of tan colored fluid was removed. New right transgluteal pelvic drain was placed. 100 mL of tan colored fluid was removed. IMPRESSION: 1. CT-guided placement of a right lower quadrant abdominal abscess drain. 2. Removal of the old right transgluteal pelvic drain. 3.  CT-guided placement of a new right transgluteal pelvic drain. Electronically Signed   By: Juliene Balder M.D.   On: 08/15/2023 10:11   CT GUIDED PERITONEAL/RETROPERITONEAL FLUID DRAIN BY PERC CATH Result Date: 08/15/2023 INDICATION: 73 year old with history of exploratory laparotomy with Hartmann's procedure for stercoral colitis with ischemia perforation. Patient has postoperative fluid collections. Previously placed right transgluteal drain has retracted and no longer appropriately positioned. EXAM: 1. CT-guided placement of right lower quadrant abdominal abscess drain 2. CT-guided placement of right transgluteal pelvic drain TECHNIQUE: Multidetector CT imaging of the abdomen and pelvis was performed following the standard protocol without IV contrast. RADIATION DOSE REDUCTION: This exam was performed according to the departmental dose-optimization program which includes automated exposure control, adjustment of the mA and/or kV according to patient size and/or use of iterative reconstruction technique. MEDICATIONS: The patient is currently admitted to the hospital and receiving intravenous antibiotics. The antibiotics were administered within an appropriate time frame prior to the initiation of the procedure. ANESTHESIA/SEDATION: Moderate (conscious) sedation was employed during this procedure. A total of Versed  3.5 mg and Fentanyl  175 mcg was administered intravenously by the radiology nurse. Total intra-service moderate Sedation Time: 83 minutes. The patient's level of consciousness and vital signs were monitored continuously by radiology nursing throughout the procedure under my direct supervision. COMPLICATIONS: None immediate. PROCEDURE: Informed written consent was obtained from the patient after a thorough discussion of the procedural risks, benefits and alternatives. All questions were addressed. Maximal Sterile Barrier Technique was utilized including caps, mask, sterile gowns, sterile gloves, sterile  drape, hand hygiene and skin antiseptic. A timeout was performed prior  to the initiation of the procedure. Supine images were obtained of the abdomen and pelvis. Large fluid collection in the right lower quadrant was targeted. Right lower abdomen was prepped with chlorhexidine  and sterile field was created. Skin was anesthetized using 1% lidocaine . Small incision was made. Using CT guidance, an 18 gauge trocar needle was directed into the right lower quadrant fluid collection. Tan colored fluid was aspirated. Superstiff Amplatz wire was placed. Tract was dilated to accommodate a 12 [place] multipurpose drain. Greater than 400 mL tan colored fluid was removed from this drain. The drain was pulled back into the right lower quadrant in order to optimize drainage. Follow up CT images were obtained. Drain was sutured to skin and attached to a suction bulb. Patient was rolled onto her left side and additional CT images were obtained. The existing transgluteal drain was cut and completely removed. The right buttock was prepped with chlorhexidine  and sterile field was created. Skin was anesthetized using 1% lidocaine . Small incision was made. Using CT guidance, 18 gauge trocar needle was directed into the pelvic fluid collection via a transgluteal approach. Tan colored fluid was aspirated. Superstiff Amplatz wire was placed. The tract was dilated to accommodate a 12 [place] multipurpose drain. 100 mL of tan colored fluid was removed. Drain was sutured to skin and attached to a suction bulb. FINDINGS: CT demonstrated multiple fluid collections in lower abdomen and pelvis. Large collections were in the right lower quadrant and in the pelvis. The existing transgluteal drain has retracted internally and no longer within the pelvic collection. Right lower quadrant drain was placed and greater than 400 mL of tan colored fluid was removed. New right transgluteal pelvic drain was placed. 100 mL of tan colored fluid was removed.  IMPRESSION: 1. CT-guided placement of a right lower quadrant abdominal abscess drain. 2. Removal of the old right transgluteal pelvic drain. 3. CT-guided placement of a new right transgluteal pelvic drain. Electronically Signed   By: Juliene Balder M.D.   On: 08/15/2023 10:11      LOS: 17 days   Ole Bourdon, NP [facility] Pager: [phone]  08/16/2023, 11:45 AM

## 2023-08-16 NOTE — Progress Notes (Signed)
 OT Cancellation Note  Patient Details Name: HELIA HAESE MRN: [id number] DOB: Oct 18, 1950   Cancelled Treatment:    Reason Eval/Treat Not Completed:  (Pt declined activity, reports signficant abdominal discomfort with sitting/mobilizing. Will continue to follow.)  Kennth Mliss Helling 08/16/2023, 11:31 AM Mliss HERO, OTR/L Acute Rehabilitation Services Office: [phone]

## 2023-08-16 NOTE — Progress Notes (Signed)
 17 Days Post-Op   Subjective/Chief Complaint: Patient reports abdomen feels weird not really able to characterize much further. She did not rest well last night.    Objective: Vital signs in last 24 hours: Temp:  [97.4 F (36.3 C)-98.4 F (36.9 C)] 97.7 F (36.5 C) (07/09 1203) Pulse Rate:  [72-90] 75 (07/09 1203) Resp:  [12-21] 14 (07/09 1203) BP: (110-157)/(66-81) 157/77 (07/09 1203) SpO2:  [95 %-97 %] 95 % (07/09 1203) Weight:  [59.6 kg] 59.6 kg (07/09 0425) Last BM Date : 08/16/23  Intake/Output from previous day: 07/08 0701 - 07/09 0700 In: 7  Out: 2885 [Urine:2150; Emesis/NG output:575; Drains:160] Intake/Output this shift: No intake/output data recorded.    Lab Results:  Gen: NAD Lungs: effort nonlabored  Abd: soft, colostomy viable and with small smear of stool, not much gas.  Mild distention. Midline wound fascia intact and no further drainage . IR drains with purulent fluid  BMET Recent Labs    08/15/23 0431 08/16/23 0350  NA 137 135  K 4.0 4.3  CL 105 103  CO2 27 25  GLUCOSE 124* 125*  BUN 14 15  CREATININE 0.43* 0.58  CALCIUM  7.5* 7.8*   PT/INR Recent Labs    08/15/23 0431  LABPROT 15.3*  INR 1.1   ABG No results for input(s): PHART, HCO3 in the last 72 hours.  Invalid input(s): PCO2, PO2  Studies/Results: CT GUIDED PERITONEAL/RETROPERITONEAL FLUID DRAIN BY PERC CATH Result Date: 08/15/2023 INDICATION: 73 year old with history of exploratory laparotomy with Hartmann's procedure for stercoral colitis with ischemia perforation. Patient has postoperative fluid collections. Previously placed right transgluteal drain has retracted and no longer appropriately positioned. EXAM: 1. CT-guided placement of right lower quadrant abdominal abscess drain 2. CT-guided placement of right transgluteal pelvic drain TECHNIQUE: Multidetector CT imaging of the abdomen and pelvis was performed following the standard protocol without IV contrast. RADIATION  DOSE REDUCTION: This exam was performed according to the departmental dose-optimization program which includes automated exposure control, adjustment of the mA and/or kV according to patient size and/or use of iterative reconstruction technique. MEDICATIONS: The patient is currently admitted to the hospital and receiving intravenous antibiotics. The antibiotics were administered within an appropriate time frame prior to the initiation of the procedure. ANESTHESIA/SEDATION: Moderate (conscious) sedation was employed during this procedure. A total of Versed  3.5 mg and Fentanyl  175 mcg was administered intravenously by the radiology nurse. Total intra-service moderate Sedation Time: 83 minutes. The patient's level of consciousness and vital signs were monitored continuously by radiology nursing throughout the procedure under my direct supervision. COMPLICATIONS: None immediate. PROCEDURE: Informed written consent was obtained from the patient after a thorough discussion of the procedural risks, benefits and alternatives. All questions were addressed. Maximal Sterile Barrier Technique was utilized including caps, mask, sterile gowns, sterile gloves, sterile drape, hand hygiene and skin antiseptic. A timeout was performed prior to the initiation of the procedure. Supine images were obtained of the abdomen and pelvis. Large fluid collection in the right lower quadrant was targeted. Right lower abdomen was prepped with chlorhexidine  and sterile field was created. Skin was anesthetized using 1% lidocaine . Small incision was made. Using CT guidance, an 18 gauge trocar needle was directed into the right lower quadrant fluid collection. Tan colored fluid was aspirated. Superstiff Amplatz wire was placed. Tract was dilated to accommodate a 12 [place] multipurpose drain. Greater than 400 mL tan colored fluid was removed from this drain. The drain was pulled back into the right lower quadrant in order to optimize  drainage. Follow  up CT images were obtained. Drain was sutured to skin and attached to a suction bulb. Patient was rolled onto her left side and additional CT images were obtained. The existing transgluteal drain was cut and completely removed. The right buttock was prepped with chlorhexidine  and sterile field was created. Skin was anesthetized using 1% lidocaine . Small incision was made. Using CT guidance, 18 gauge trocar needle was directed into the pelvic fluid collection via a transgluteal approach. Tan colored fluid was aspirated. Superstiff Amplatz wire was placed. The tract was dilated to accommodate a 12 [place] multipurpose drain. 100 mL of tan colored fluid was removed. Drain was sutured to skin and attached to a suction bulb. FINDINGS: CT demonstrated multiple fluid collections in lower abdomen and pelvis. Large collections were in the right lower quadrant and in the pelvis. The existing transgluteal drain has retracted internally and no longer within the pelvic collection. Right lower quadrant drain was placed and greater than 400 mL of tan colored fluid was removed. New right transgluteal pelvic drain was placed. 100 mL of tan colored fluid was removed. IMPRESSION: 1. CT-guided placement of a right lower quadrant abdominal abscess drain. 2. Removal of the old right transgluteal pelvic drain. 3. CT-guided placement of a new right transgluteal pelvic drain. Electronically Signed   By: Juliene Balder M.D.   On: 08/15/2023 10:11   CT GUIDED PERITONEAL/RETROPERITONEAL FLUID DRAIN BY PERC CATH Result Date: 08/15/2023 INDICATION: 73 year old with history of exploratory laparotomy with Hartmann's procedure for stercoral colitis with ischemia perforation. Patient has postoperative fluid collections. Previously placed right transgluteal drain has retracted and no longer appropriately positioned. EXAM: 1. CT-guided placement of right lower quadrant abdominal abscess drain 2. CT-guided placement of right transgluteal pelvic drain  TECHNIQUE: Multidetector CT imaging of the abdomen and pelvis was performed following the standard protocol without IV contrast. RADIATION DOSE REDUCTION: This exam was performed according to the departmental dose-optimization program which includes automated exposure control, adjustment of the mA and/or kV according to patient size and/or use of iterative reconstruction technique. MEDICATIONS: The patient is currently admitted to the hospital and receiving intravenous antibiotics. The antibiotics were administered within an appropriate time frame prior to the initiation of the procedure. ANESTHESIA/SEDATION: Moderate (conscious) sedation was employed during this procedure. A total of Versed  3.5 mg and Fentanyl  175 mcg was administered intravenously by the radiology nurse. Total intra-service moderate Sedation Time: 83 minutes. The patient's level of consciousness and vital signs were monitored continuously by radiology nursing throughout the procedure under my direct supervision. COMPLICATIONS: None immediate. PROCEDURE: Informed written consent was obtained from the patient after a thorough discussion of the procedural risks, benefits and alternatives. All questions were addressed. Maximal Sterile Barrier Technique was utilized including caps, mask, sterile gowns, sterile gloves, sterile drape, hand hygiene and skin antiseptic. A timeout was performed prior to the initiation of the procedure. Supine images were obtained of the abdomen and pelvis. Large fluid collection in the right lower quadrant was targeted. Right lower abdomen was prepped with chlorhexidine  and sterile field was created. Skin was anesthetized using 1% lidocaine . Small incision was made. Using CT guidance, an 18 gauge trocar needle was directed into the right lower quadrant fluid collection. Tan colored fluid was aspirated. Superstiff Amplatz wire was placed. Tract was dilated to accommodate a 12 [place] multipurpose drain. Greater than 400 mL  tan colored fluid was removed from this drain. The drain was pulled back into the right lower quadrant in order to optimize drainage.  Follow up CT images were obtained. Drain was sutured to skin and attached to a suction bulb. Patient was rolled onto her left side and additional CT images were obtained. The existing transgluteal drain was cut and completely removed. The right buttock was prepped with chlorhexidine  and sterile field was created. Skin was anesthetized using 1% lidocaine . Small incision was made. Using CT guidance, 18 gauge trocar needle was directed into the pelvic fluid collection via a transgluteal approach. Tan colored fluid was aspirated. Superstiff Amplatz wire was placed. The tract was dilated to accommodate a 12 [place] multipurpose drain. 100 mL of tan colored fluid was removed. Drain was sutured to skin and attached to a suction bulb. FINDINGS: CT demonstrated multiple fluid collections in lower abdomen and pelvis. Large collections were in the right lower quadrant and in the pelvis. The existing transgluteal drain has retracted internally and no longer within the pelvic collection. Right lower quadrant drain was placed and greater than 400 mL of tan colored fluid was removed. New right transgluteal pelvic drain was placed. 100 mL of tan colored fluid was removed. IMPRESSION: 1. CT-guided placement of a right lower quadrant abdominal abscess drain. 2. Removal of the old right transgluteal pelvic drain. 3. CT-guided placement of a new right transgluteal pelvic drain. Electronically Signed   By: Juliene Balder M.D.   On: 08/15/2023 10:11    Anti-infectives: Anti-infectives (From admission, onward)    Start     Dose/Rate Route Frequency Ordered Stop   08/06/23 1130  acyclovir  (ZOVIRAX ) 200 MG capsule 400 mg  Status:  Discontinued        400 mg Oral 3 times daily 08/06/23 1037 08/08/23 1424   08/06/23 1100  piperacillin -tazobactam (ZOSYN ) IVPB 3.375 g        3.375 g 12.5 mL/hr over 240  Minutes Intravenous Every 8 hours 08/06/23 1051     07/30/23 1800  piperacillin -tazobactam (ZOSYN ) IVPB 3.375 g        3.375 g 12.5 mL/hr over 240 Minutes Intravenous Every 8 hours 07/30/23 1332 08/04/23 2359   07/30/23 1400  piperacillin -tazobactam (ZOSYN ) IVPB 3.375 g  Status:  Discontinued        3.375 g 100 mL/hr over 30 Minutes Intravenous Every 8 hours 07/30/23 1329 07/30/23 1332   07/30/23 1015  piperacillin -tazobactam (ZOSYN ) IVPB 3.375 g        3.375 g 100 mL/hr over 30 Minutes Intravenous  Once 07/30/23 1013 07/30/23 1311       Assessment/Plan: Procedure(s): LAPAROTOMY, EXPLORATORY (N/A) COLECTOMY, SIGMOID, OPEN s/p Procedure(s): LAPAROTOMY, EXPLORATORY (N/A) COLECTOMY, SIGMOID, OPEN POD 17, s/p ex lap with Hartmann's procedure for stercoral colitis with ischemia and perforation with feculent peritonitis, Dr. Polly 6/22  - NGT output is 550 for the last 24h - will put to gravity and assess for possible removal tomorrow  - add stomal suppositories  - CT scan (7/5) shows collection remains sizable and has slightly enlarged compared with the previous study. This abuts the rectal suture line and a leak in this area cannot be excluded. - IR repositioned drains, both purulent. Cx with proteus mirabilis and enterococcus - Continue TNA - conitnue to mobilize as able. - BID dressing changes to midline wound  - WOC following - pulm toilet  - BID PPI  FEN -n.p.o./TNA/NGT to gravity  VTE - lovenox   ID - zosyn  6/29>>   - per TRH -  Nephrolithiasis - per urology,  Chronic constipation - bowel regimen HTN Glaucoma  LOS: 17 days   Discussed  plan of care with patient and family at bedside  Burnard JONELLE Louder, [facility] 08/16/2023, 2:27 PM Please see Amion for pager number during day hours 7:00am-4:30pm

## 2023-08-17 DIAGNOSIS — N1330 Unspecified hydronephrosis: Secondary | ICD-10-CM | POA: Diagnosis not present

## 2023-08-17 DIAGNOSIS — I10 Essential (primary) hypertension: Secondary | ICD-10-CM | POA: Diagnosis not present

## 2023-08-17 DIAGNOSIS — K631 Perforation of intestine (nontraumatic): Secondary | ICD-10-CM | POA: Diagnosis not present

## 2023-08-17 DIAGNOSIS — N201 Calculus of ureter: Secondary | ICD-10-CM | POA: Diagnosis not present

## 2023-08-17 LAB — CBC
HCT: 25.5 % — ABNORMAL LOW (ref 36.0–46.0)
Hemoglobin: 8 g/dL — ABNORMAL LOW (ref 12.0–15.0)
MCH: 28.1 pg (ref 26.0–34.0)
MCHC: 31.4 g/dL (ref 30.0–36.0)
MCV: 89.5 fL (ref 80.0–100.0)
Platelets: 513 K/uL — ABNORMAL HIGH (ref 150–400)
RBC: 2.85 MIL/uL — ABNORMAL LOW (ref 3.87–5.11)
RDW: 15.6 % — ABNORMAL HIGH (ref 11.5–15.5)
WBC: 8.7 K/uL (ref 4.0–10.5)
nRBC: 0 % (ref 0.0–0.2)

## 2023-08-17 LAB — COMPREHENSIVE METABOLIC PANEL WITH GFR
ALT: 9 U/L (ref 0–44)
AST: 17 U/L (ref 15–41)
Albumin: <1.5 g/dL — ABNORMAL LOW (ref 3.5–5.0)
Alkaline Phosphatase: 60 U/L (ref 38–126)
Anion gap: 5 (ref 5–15)
BUN: 18 mg/dL (ref 8–23)
CO2: 25 mmol/L (ref 22–32)
Calcium: 7.8 mg/dL — ABNORMAL LOW (ref 8.9–10.3)
Chloride: 103 mmol/L (ref 98–111)
Creatinine, Ser: 0.74 mg/dL (ref 0.44–1.00)
GFR, Estimated: >60 mL/min (ref >60)
Glucose, Bld: 104 mg/dL — ABNORMAL HIGH (ref 70–99)
Potassium: 4.2 mmol/L (ref 3.5–5.1)
Sodium: 133 mmol/L — ABNORMAL LOW (ref 135–145)
Total Bilirubin: 0.5 mg/dL (ref 0.0–1.2)
Total Protein: 5.2 g/dL — ABNORMAL LOW (ref 6.5–8.1)

## 2023-08-17 LAB — GLUCOSE, CAPILLARY
Glucose-Capillary: 110 mg/dL — ABNORMAL HIGH (ref 70–99)
Glucose-Capillary: 113 mg/dL — ABNORMAL HIGH (ref 70–99)
Glucose-Capillary: 114 mg/dL — ABNORMAL HIGH (ref 70–99)
Glucose-Capillary: 116 mg/dL — ABNORMAL HIGH (ref 70–99)

## 2023-08-17 LAB — MAGNESIUM: Magnesium: 1.9 mg/dL (ref 1.7–2.4)

## 2023-08-17 LAB — PHOSPHORUS: Phosphorus: 3.4 mg/dL (ref 2.5–4.6)

## 2023-08-17 MED ORDER — TRAVASOL 10 % IV SOLN
INTRAVENOUS | Status: AC
  Filled 2023-08-17: qty 900

## 2023-08-17 MED ORDER — MAGNESIUM SULFATE 2 GM/50ML IV SOLN
2.0000 g | Freq: Once | INTRAVENOUS | Status: AC
  Administered 2023-08-17: 2 g via INTRAVENOUS
  Filled 2023-08-17: qty 50

## 2023-08-17 MED ORDER — BOOST / RESOURCE BREEZE PO LIQD CUSTOM
1.0000 | Freq: Three times a day (TID) | ORAL | Status: DC
  Administered 2023-08-17 – 2023-08-21 (×4): 1 via ORAL

## 2023-08-17 NOTE — Progress Notes (Signed)
 PHARMACY - TOTAL PARENTERAL NUTRITION CONSULT NOTE  Indication: intolerance to feeding, recurrent ileus  Patient Measurements: Height: 5' 4 (162.6 cm) Weight: 62.7 kg (138 lb 3.7 oz) IBW/kg (Calculated) : 54.7 TPN AdjBW (KG): 57.2 Body mass index is 23.73 kg/m. Usual Weight: ~59 kg  Assessment:  72 YOF s/p exp lap with Hartmann's procedure for stercoral colitis with ischemia and perforation with feculent peritonitis on 6/22. Noted pt has been NPO/clear liquid/full liquid diet since after surgery with limited intake. Pt now with ileus on Xray (dilated loops of small bowel) and N/V with liquid diet so NGT replaced 7/4, now NPO and starting TPN. Pt at risk of refeeding with prolonged period of little po intake since admission.  Glucose / Insulin : no hx DM - CBGs < 150.  No SSI given. Electrolytes: Na 133, K 4.2 (goal; >/= 4 for ileus), Cl 103, Ca 7.8 [CoCa 9.88], Mag 1.9 (goal >/= 2 for ileus), Phos 3.4 Renal: SCr < 1, BUN WNL Hepatic: LFTs / TG / tbili WNL, albumin  < 1.5 [LFT's from 7/10] Intake / Output; MIVF: UOP 1.4 ml/kg/hr, NG/OG , IR drain , colostomy 0mL GI Imaging: 7/5 abd xray - persistent dilated loops of small bowel 7/5 CT - pelvic fluid collection slightly enlarged, suspicious for peritonitis, partially obstructing calculus in L ureter, ileus GI Surgeries / Procedures:  6/22 ex-lap with Hartmann's procedure for stercoral colitis with ischemia and perf with feculent peritonitis 7/8 CT-guided placement RLQ abd abscess drain [>4109mL tan colored fluid removed]  Central access: PICC placed 08/12/23 TPN start date: 08/12/23  Nutritional Goals: Goal TPN (AA 50g/L, CHO 15%, ILE 25g/L) at 75 ml/hr will provide 90g AA, 270g CHO, 45g ILE, 1728 kCal and sterile water  RD Estimated Needs Total Energy Estimated Needs: 1650-1850 kcals Total Protein Estimated Needs: 80-90 g Total Fluid Estimated Needs: >/= 1.7 L  Current Nutrition:  TPN  Plan:  Continue TPN at goal  rate of 75 ml/hr to provide 100% of needs Electrolytes in TPN: Na 20mEq/L, K 55mEq/L, Ca 50mEq/L, Mag 82mEq/L, Phos 27mmol/L, Cl:Ac 1:1 Add standard MVI and trace elements to TPN Magnesium  2 g iv x1 Thiamine  100mg  IV daily. Last dose 7/10 Monitor TPN labs on Mon/Thurs - mag AM 7/10   Ebony Rickel BS, PharmD, BCPS Clinical Pharmacist 08/17/2023 8:55 AM  Contact: [phone] after 3 PM  Be curious, not judgmental... -Davina Sprinkles

## 2023-08-17 NOTE — Plan of Care (Signed)
   Problem: Education: Goal: Knowledge of General Education information will improve Description Including pain rating scale, medication(s)/side effects and non-pharmacologic comfort measures Outcome: Progressing

## 2023-08-17 NOTE — Plan of Care (Signed)

## 2023-08-17 NOTE — Progress Notes (Signed)
 Physical Therapy Treatment Patient Details Name: Angela [name] MRN: [id number] DOB: 16-Mar-1950 Today's Date: 08/17/2023   History of Present Illness Angela [name] is a 73 y.o. female who presents with abdominal pain and bloating  Renal CT revealed interval development of intraperitoneal free air and moderate volume free fluid containing layering hyperdensities consistent with bowel perforation, distal left ureteral stone measuring 9 mm with interval decrease left hydroureteronephrosis. . Past medical history significant of hypertension, hyperlipidemia, glaucoma and prior tobacco abuse    PT Comments  Pt tolerated treatment well today. Pt today was able to ambulate in hallway with RW at supervision level. No change in DC/DME recs at this time. PT will continue to follow.     If plan is discharge home, recommend the following: A little help with walking and/or transfers;A little help with bathing/dressing/bathroom;Assistance with cooking/housework;Direct supervision/assist for medications management;Assist for transportation;Help with stairs or ramp for entrance   Can travel by private vehicle        Equipment Recommendations  Rolling walker (2 wheels)    Recommendations for Other Services       Precautions / Restrictions Precautions Precautions: Fall;Other (comment) Recall of Precautions/Restrictions: Intact Precaution/Restrictions Comments: Multiple JP drains now along with ostomy bag. NG tube has been pulled Restrictions Weight Bearing Restrictions Per Provider Order: No     Mobility  Bed Mobility Overal bed mobility: Needs Assistance Bed Mobility: Sit to Supine       Sit to supine: Min assist   General bed mobility comments: Min A for BLE management back into bed.    Transfers Overall transfer level: Needs assistance Equipment used: Rolling walker (2 wheels) Transfers: Sit to/from Stand, Bed to chair/wheelchair/BSC Sit to Stand: Contact guard assist   Step pivot  transfers: Contact guard assist       General transfer comment: Cues for hand placement    Ambulation/Gait Ambulation/Gait assistance: Supervision, +2 safety/equipment Gait Distance (Feet): 300 Feet Assistive device: Rolling walker (2 wheels) Gait Pattern/deviations: Decreased stride length, Step-through pattern Gait velocity: decreased     General Gait Details: Slowed step through pattern. no LOB noted. +2 assistance for lines and leads.   Stairs             Wheelchair Mobility     Tilt Bed    Modified Rankin (Stroke Patients Only)       Balance Overall balance assessment: Needs assistance Sitting-balance support: Bilateral upper extremity supported, Feet supported Sitting balance-Leahy Scale: Good     Standing balance support: Bilateral upper extremity supported, During functional activity Standing balance-Leahy Scale: Fair Standing balance comment: Reliant on RW                            Communication Communication Communication: No apparent difficulties  Cognition Arousal: Alert Behavior During Therapy: WFL for tasks assessed/performed   PT - Cognitive impairments: No apparent impairments                         Following commands: Intact      Cueing Cueing Techniques: Verbal cues  Exercises      General Comments General comments (skin integrity, edema, etc.): VSS      Pertinent Vitals/Pain Pain Assessment Pain Assessment: Faces Faces Pain Scale: Hurts a little bit Pain Location: abdomen Pain Descriptors / Indicators: Discomfort Pain Intervention(s): Monitored during session    Home Living  Prior Function            PT Goals (current goals can now be found in the care plan section) Progress towards PT goals: Progressing toward goals    Frequency    Min 3X/week      PT Plan      Co-evaluation              AM-PAC PT 6 Clicks Mobility   Outcome Measure   Help needed turning from your back to your side while in a flat bed without using bedrails?: A Little Help needed moving from lying on your back to sitting on the side of a flat bed without using bedrails?: A Little Help needed moving to and from a bed to a chair (including a wheelchair)?: A Little Help needed standing up from a chair using your arms (e.g., wheelchair or bedside chair)?: A Little Help needed to walk in hospital room?: A Little Help needed climbing 3-5 steps with a railing? : A Little 6 Click Score: 18    End of Session Equipment Utilized During Treatment: Gait belt Activity Tolerance: Patient tolerated treatment well Patient left: in bed;with call bell/phone within reach;with family/visitor present Nurse Communication: Mobility status PT Visit Diagnosis: Other abnormalities of gait and mobility (R26.89)     Time: 9084-9054 PT Time Calculation (min) (ACUTE ONLY): 30 min  Charges:    $Gait Training: 23-37 mins PT General Charges $$ ACUTE PT VISIT: 1 Visit                     Sueellen NOVAK, PT, DPT Acute Rehab Services [phone]    Roxie Gueye 08/17/2023, 2:09 PM

## 2023-08-17 NOTE — TOC Progression Note (Signed)
 Transition of Care [facility]) - Progression Note    Patient Details  Name: Angela [name] MRN: [id number] Date of Birth: [birth year]  [facility] CM/SW Contact  Corean JAYSON Canary, RN Phone Number: 08/17/2023, 10:01 AM  Clinical Narrative:    Patient with new drain from IR, remains working with PT with progressive weakness. Will need a walker and perhaps other DME She is currently on TPN  TOC will continue to follow for needs, recommendations, and transitions of care        Expected Discharge Plan and Services                                               Social Determinants of Health (SDOH) Interventions SDOH Screenings   Food Insecurity: Patient Declined (08/02/2023)  Housing: Unknown (08/02/2023)  Transportation Needs: Patient Declined (08/02/2023)  Utilities: Patient Declined (08/02/2023)  Alcohol Screen: Low Risk  (04/19/2023)  Depression (PHQ2-9): Low Risk  (06/23/2023)  Financial Resource Strain: Patient Declined (04/19/2023)  Physical Activity: Sufficiently Active (04/19/2023)  Social Connections: Patient Declined (08/02/2023)  Stress: No Stress Concern Present (04/19/2023)  Tobacco Use: Medium Risk (07/30/2023)  Health Literacy: Adequate Health Literacy (04/19/2023)    Readmission Risk Interventions     No data to display

## 2023-08-17 NOTE — Progress Notes (Signed)
 18 Days Post-Op   Subjective/Chief Complaint: Patient NGT to gravity overnight with only 100 cc out. Denies nausea. Getting ready to ambulate. Wants NGT out.    Objective: Vital signs in last 24 hours: Temp:  [97.6 F (36.4 C)-98.5 F (36.9 C)] 98 F (36.7 C) (07/10 0914) Pulse Rate:  [75-91] 76 (07/10 0914) Resp:  [12-25] 12 (07/10 0914) BP: (137-160)/(64-90) 139/71 (07/10 0914) SpO2:  [95 %-97 %] 97 % (07/10 0914) Weight:  [62.7 kg] 62.7 kg (07/10 0500) Last BM Date : 08/16/23  Intake/Output from previous day: 07/09 0701 - 07/10 0700 In: 2807.9 [I.V.:2738; IV Piggyback:49.9] Out: 2320 [Urine:2100; Emesis/NG output:100; Drains:120] Intake/Output this shift: No intake/output data recorded.    Lab Results:  Gen: NAD Lungs: effort nonlabored  Abd: soft, colostomy viable and with a little gas present.  Mild distention. Midline dressing c/d/i . IR drains with purulent fluid. NGT was to gravity with 100 cc bilious drainage  BMET Recent Labs    08/16/23 0350 08/17/23 0443  NA 135 133*  K 4.3 4.2  CL 103 103  CO2 25 25  GLUCOSE 125* 104*  BUN 15 18  CREATININE 0.58 0.74  CALCIUM  7.8* 7.8*   PT/INR Recent Labs    08/15/23 0431  LABPROT 15.3*  INR 1.1   ABG No results for input(s): PHART, HCO3 in the last 72 hours.  Invalid input(s): PCO2, PO2  Studies/Results: No results found.   Anti-infectives: Anti-infectives (From admission, onward)    Start     Dose/Rate Route Frequency Ordered Stop   08/06/23 1130  acyclovir  (ZOVIRAX ) 200 MG capsule 400 mg  Status:  Discontinued        400 mg Oral 3 times daily 08/06/23 1037 08/08/23 1424   08/06/23 1100  piperacillin -tazobactam (ZOSYN ) IVPB 3.375 g        3.375 g 12.5 mL/hr over 240 Minutes Intravenous Every 8 hours 08/06/23 1051     07/30/23 1800  piperacillin -tazobactam (ZOSYN ) IVPB 3.375 g        3.375 g 12.5 mL/hr over 240 Minutes Intravenous Every 8 hours 07/30/23 1332 08/04/23 2359   07/30/23  1400  piperacillin -tazobactam (ZOSYN ) IVPB 3.375 g  Status:  Discontinued        3.375 g 100 mL/hr over 30 Minutes Intravenous Every 8 hours 07/30/23 1329 07/30/23 1332   07/30/23 1015  piperacillin -tazobactam (ZOSYN ) IVPB 3.375 g        3.375 g 100 mL/hr over 30 Minutes Intravenous  Once 07/30/23 1013 07/30/23 1311       Assessment/Plan: Procedure(s): LAPAROTOMY, EXPLORATORY (N/A) COLECTOMY, SIGMOID, OPEN s/p Procedure(s): LAPAROTOMY, EXPLORATORY (N/A) COLECTOMY, SIGMOID, OPEN POD 18, s/p ex lap with Hartmann's procedure for stercoral colitis with ischemia and perforation with feculent peritonitis, Dr. Polly 6/22  - NGT low output on gravity, removed. Start CLD - continue stomal suppositories  - CT scan (7/5) shows collection remains sizable and has slightly enlarged compared with the previous study. This abuts the rectal suture line and a leak in this area cannot be excluded. - IR repositioned drains, both purulent. Cx with proteus mirabilis and enterococcus both sensitive to zosyn . Consider repeat scan when drain output decreases  - Continue TNA - conitnue to mobilize as able. - BID dressing changes to midline wound  - WOC following - pulm toilet  - BID PPI  FEN - CLD, TPN - will need to be NPO a MN for uro procedure tomorrow   VTE - lovenox   ID - zosyn  6/29>>   -  per TRH -  Nephrolithiasis - per urology,  Chronic constipation - bowel regimen HTN Glaucoma  LOS: 18 days   Discussed plan of care with patient and family at bedside  Burnard JONELLE Louder, [facility] 08/17/2023, 9:59 AM Please see Amion for pager number during day hours 7:00am-4:30pm

## 2023-08-17 NOTE — Progress Notes (Signed)
 PROGRESS NOTE        PATIENT DETAILS Name: Angela [name] Age: 73 y.o. Sex: female Date of Birth: 1950-10-16 Admit Date: 07/30/2023 Admitting Physician Maximino DELENA Sharps, MD ERE:[id number], [name], DO  Brief Summary: Patient is a 73 y.o.  female with history of HTN, HLD who presented with worsening abdominal pain-patient was found to have sigmoid perforation with feculent peritonitis in the setting of stercoral colitis.  Patient underwent sigmoidectomy with end colostomy on 6/22-unfortunately postop course complicated by ileus and pelvic residual abscesses requiring IR drain placement.    Significant events: 6/21>> evaluated in the ED for abdominal pain-found to have left mid ureteral stone-9 mm with hydronephrosis-and large volume of stool-managed with supportive care and discharged home. 6/22>> worsening abdominal pain-repeat CT scan consistent with bowel perforation-surgery consult-admit to [facility]. 7/04>> vomiting-recurrent ileus-NG tube reinserted 7/05>> TNA started 7/7>> IR placed RLQ drain and right transgluteal pelvic drain.  Significant studies: 6/22>> CT renal stone study: Interval development of intraperitoneal free air-moderate volume free fluid-consistent with bowel perforation, persistent large volume stool with dilated sigmoid colon.  Similar location of left ureteral stone-9 mm. 6/29>> CT abdomen/pelvis: S/p partial colectomy-LLQ ostomy-complex free fluid with multiple foci of air-most pronounced in the surgical bed-possibility of superimposed infection or bowel leak cannot be excluded. 6/30>> echo: EF 60-65%. 7/05>> CT abdomen/pelvis: Previous collection with pigtail has increased in size, other less well-defined but enlarging peritoneal fluid collections.  Persistent-partially obstructing 11 mm calculus in the distal left ureter.  Persistent fluid loops of dilated small bowel-likely ileus.  Significant microbiology data: 6/22>> urine culture: No  growth 6/22>> blood culture: No growth 6/30>> pelvic abscess culture: Gram-negative rods/gram-positive cocci-mixed anaerobic flora. 7/07>> Pelvic abscess culture:moderate proteus and enterococcus  Procedures: 6/22>> sigmoidectomy with end colostomy. 6/30>> CT-guided drain and pelvic abscess by IR  Consults: General Surgery IR Urology  Subjective: Trickle of loose brown stools in ostomy today.  No other complaints.  Finally got some sleep last night.  Objective: Vitals: Blood pressure 139/71, pulse 76, temperature 98 F (36.7 C), temperature source Oral, resp. rate 12, height 5' 4 (1.626 m), weight 62.7 kg, SpO2 97%.   Exam: Awake/alert CVS: S1-S2 regular Chest: Clear to auscultation Abdomen: Soft nontender-mildly distended-trickle of loose brown stool seen in ostomy bag Extremities:+ Edema Nonfocal exam  Pertinent Labs/Radiology:    Latest Ref Rng & Units 08/17/2023    4:43 AM 08/15/2023    4:31 AM 08/14/2023    1:30 AM  CBC  WBC 4.0 - 10.5 K/uL 8.7  5.7  5.9   Hemoglobin 12.0 - 15.0 g/dL 8.0  7.3  7.2   Hematocrit 36.0 - 46.0 % 25.5  23.2  23.2   Platelets 150 - 400 K/uL 513  557  639     Lab Results  Component Value Date   NA 133 (L) 08/17/2023   K 4.2 08/17/2023   CL 103 08/17/2023   CO2 25 08/17/2023     Assessment/Plan: Sepsis secondary to stercoral colitis with ischemia/perforation of sigmoid colon with feculent peritonitis-s/p sigmoid colectomy/ostomy on 6/22-complicated by postoperative ileus and then residual pelvic abscess requiring CT-guided drain placement by on 6/30. Unchanged-remains n.p.o.-NG tube in place-TNA ongoing since 7/5 -IR consulted due to enlarging pelvic collections on repeat CT-subsequently RLQ drain and transgluteal drain placed on 7/7 Remains on Zosyn  General Surgery following-will await further recommendations.  Coffee-ground  NG tube emesis Seems to have resolved-no longer coffee-ground emesis-bile appearing fluid in  canister. Continue PPI Hb essentially unchanged-continue to follow closely If overt GI bleeding occurs-will need to get GI input.  If hemoglobin drops significantly-needs PRBC transfusion.  Hydronephrosis secondary to distal left ureteral stone Urology following-with recommendations for supportive care for now-now that her medical problems have more or less stabilized-patient is scheduled for ureteral stent placement on 7/11 noon.   Hospital delirium Secondary to acute illness/narcotics Completely awake/alert this morning Maintain delirium precautions  Normocytic anemia Due to acute illness,+/- coffee-ground emesis on 7/7 Hb stable and better today. Follow and transfuse if significant drop.  Thrombocytosis Likely reactive Supportive care  Hypomagnesemia/hypophosphatemia/hypokalemia Secondary to NG tube suctioning Resolved.  HTN BP stable-all oral antihypertensives on hold due to n.p.o. status Continue with as needed IV hydralazine  and metoprolol .   Lower extremity edema Secondary to hypoalbuminemia/IV fluid resuscitation in the setting of acute illness Echo stable-UA negative for proteinuria Will start diuretics once she is a bit more stable.  Chronic idiopathic constipation Bowel function recovering-brown stools Once more stable-will need to be placed on a bowel regimen.  Odynophagia secondary to oral/aphthous ulcers Improved on Valtrex and Magic mouthwash Supportive care  Glaucoma Timolol  eyedrops  Insomnia Appears to be primarily due to pain-anxiety Schedule trazodone  at night If no response-use IV Benadryl   Debility/deconditioning PT OT eval-Home health recommended.  Code status:   Code Status: Full Code   DVT Prophylaxis: enoxaparin  (LOVENOX ) injection 40 mg Start: 08/17/23 1000 Place TED hose Start: 08/09/23 1000 SCDs Start: 07/30/23 1954 SCDs Start: 07/30/23 1953   Family Communication: Multiple family members at bedside.   Disposition  Plan: Status is: Inpatient Remains inpatient appropriate because: Severity of illness   Planned Discharge Destination:Home health   Diet: Diet Order             Diet NPO time specified Except for: Sips with Meds  Diet effective midnight           Diet clear liquid Room service appropriate? Yes; Fluid consistency: Thin  Diet effective now                     Antimicrobial agents: Anti-infectives (From admission, onward)    Start     Dose/Rate Route Frequency Ordered Stop   08/06/23 1130  acyclovir  (ZOVIRAX ) 200 MG capsule 400 mg  Status:  Discontinued        400 mg Oral 3 times daily 08/06/23 1037 08/08/23 1424   08/06/23 1100  piperacillin -tazobactam (ZOSYN ) IVPB 3.375 g        3.375 g 12.5 mL/hr over 240 Minutes Intravenous Every 8 hours 08/06/23 1051     07/30/23 1800  piperacillin -tazobactam (ZOSYN ) IVPB 3.375 g        3.375 g 12.5 mL/hr over 240 Minutes Intravenous Every 8 hours 07/30/23 1332 08/04/23 2359   07/30/23 1400  piperacillin -tazobactam (ZOSYN ) IVPB 3.375 g  Status:  Discontinued        3.375 g 100 mL/hr over 30 Minutes Intravenous Every 8 hours 07/30/23 1329 07/30/23 1332   07/30/23 1015  piperacillin -tazobactam (ZOSYN ) IVPB 3.375 g        3.375 g 100 mL/hr over 30 Minutes Intravenous  Once 07/30/23 1013 07/30/23 1311        MEDICATIONS: Scheduled Meds:  acetaminophen   1,000 mg Per Tube Q6H   bisacodyl   10 mg Rectal Daily   Chlorhexidine  Gluconate Cloth  6 each Topical Daily   enoxaparin  (LOVENOX )  injection  40 mg Subcutaneous Q24H   feeding supplement  1 Container Oral TID BM   latanoprost   1 drop Both Eyes QHS   magic mouthwash  5 mL Oral QID   methocarbamol   500 mg Oral TID   Or   methocarbamol  (ROBAXIN ) injection  500 mg Intravenous TID   pantoprazole  (PROTONIX ) IV  40 mg Intravenous Q12H   sodium chloride  flush  3 mL Intravenous Q12H   sodium chloride  flush  5 mL Intracatheter Q8H   sodium chloride  flush  5 mL Intracatheter Q8H    timolol   1 drop Both Eyes BID   traZODone   50 mg Per Tube QHS   Continuous Infusions:  piperacillin -tazobactam (ZOSYN )  IV 3.375 g (08/17/23 0847)   TPN ADULT (ION) 75 mL/hr at 08/17/23 0700   TPN ADULT (ION)     PRN Meds:.albuterol , diphenhydrAMINE , hydrALAZINE , HYDROmorphone  (DILAUDID ) injection, melatonin, metoprolol  tartrate, ondansetron  **OR** ondansetron  (ZOFRAN ) IV, mouth rinse, oxyCODONE , phenol, sodium chloride  flush   I have personally reviewed following labs and imaging studies  LABORATORY DATA: CBC: Recent Labs  Lab 08/12/23 0401 08/14/23 0130 08/15/23 0431 08/17/23 0443  WBC 6.7 5.9 5.7 8.7  HGB 9.0* 7.2* 7.3* 8.0*  HCT 27.1* 23.2* 23.2* 25.5*  MCV 88.0 91.3 90.6 89.5  PLT 787* 639* 557* 513*    Basic Metabolic Panel: Recent Labs  Lab 08/13/23 0325 08/14/23 0130 08/15/23 0431 08/16/23 0350 08/17/23 0443  NA 138 137 137 135 133*  K 2.8* 3.3* 4.0 4.3 4.2  CL 98 100 105 103 103  CO2 31 30 27 25 25   GLUCOSE 137* 144* 124* 125* 104*  BUN 17 17 14 15 18   CREATININE 0.50 0.41* 0.43* 0.58 0.74  CALCIUM  7.3* 7.5* 7.5* 7.8* 7.8*  MG 1.7 1.9 1.8 2.2 1.9  PHOS 2.1* 2.0* 2.9 3.2 3.4    GFR: Estimated Creatinine Clearance: 56.5 mL/min (by C-G formula based on SCr of 0.74 mg/dL).  Liver Function Tests: Recent Labs  Lab 08/11/23 0505 08/14/23 0130 08/17/23 0443  AST 13* 13* 17  ALT 11 11 9   ALKPHOS 75 54 60  BILITOT 1.2 0.3 0.5  PROT 5.2* 4.8* 5.2*  ALBUMIN  1.7* <1.5* <1.5*   No results for input(s): LIPASE, AMYLASE in the last 168 hours. No results for input(s): AMMONIA in the last 168 hours.  Coagulation Profile: Recent Labs  Lab 08/15/23 0431  INR 1.1     Cardiac Enzymes: No results for input(s): CKTOTAL, CKMB, CKMBINDEX, TROPONINI in the last 168 hours.  BNP (last 3 results) No results for input(s): PROBNP in the last 8760 hours.  Lipid Profile: No results for input(s): CHOL, HDL, LDLCALC, TRIG, CHOLHDL,  LDLDIRECT in the last 72 hours.   Thyroid  Function Tests: No results for input(s): TSH, T4TOTAL, FREET4, T3FREE, THYROIDAB in the last 72 hours.  Anemia Panel: No results for input(s): VITAMINB12, FOLATE, FERRITIN, TIBC, IRON, RETICCTPCT in the last 72 hours.  Urine analysis:    Component Value Date/Time   COLORURINE STRAW (A) 08/06/2023 1123   APPEARANCEUR CLEAR 08/06/2023 1123   LABSPEC 1.009 08/06/2023 1123   PHURINE 6.0 08/06/2023 1123   GLUCOSEU NEGATIVE 08/06/2023 1123   HGBUR NEGATIVE 08/06/2023 1123   BILIRUBINUR NEGATIVE 08/06/2023 1123   KETONESUR 5 (A) 08/06/2023 1123   PROTEINUR NEGATIVE 08/06/2023 1123   NITRITE NEGATIVE 08/06/2023 1123   LEUKOCYTESUR NEGATIVE 08/06/2023 1123    Sepsis Labs: Lactic Acid, Venous    Component Value Date/Time   LATICACIDVEN 1.8 07/30/2023 1213  MICROBIOLOGY: Recent Results (from the past 240 hours)  Aerobic/Anaerobic Culture w Gram Stain (surgical/deep wound)     Status: None   Collection Time: 08/07/23  1:41 PM   Specimen: Abscess  Result Value Ref Range Status   Specimen Description ABSCESS  Final   Special Requests NONE  Final   Gram Stain   Final    ABUNDANT WBC PRESENT, PREDOMINANTLY PMN ABUNDANT GRAM NEGATIVE RODS ABUNDANT GRAM POSITIVE COCCI Performed at [facility], [address] [place], [place] [address]    Culture   Final    ABUNDANT MULTIPLE ORGANISMS PRESENT, NONE PREDOMINANT MIXED ANAEROBIC FLORA PRESENT.  CALL LAB IF FURTHER IID REQUIRED.    Report Status 08/09/2023 FINAL  Final  Aerobic/Anaerobic Culture w Gram Stain (surgical/deep wound)     Status: None (Preliminary result)   Collection Time: 08/14/23  6:13 PM   Specimen: Abscess  Result Value Ref Range Status   Specimen Description ABSCESS ABDOMEN  Final   Special Requests RLQ  Final   Gram Stain   Final    ABUNDANT WBC PRESENT, PREDOMINANTLY PMN ABUNDANT GRAM NEGATIVE RODS ABUNDANT GRAM POSITIVE COCCI IN  PAIRS    Culture   Final    MODERATE PROTEUS MIRABILIS MODERATE ENTEROCOCCUS FAECALIS CULTURE REINCUBATED FOR BETTER GROWTH ABUNDANT BACTEROIDES THETAIOTAOMICRON BETA LACTAMASE POSITIVE Performed at [facility], [address], [place], [place] [address]    Report Status PENDING  Incomplete   Organism ID, Bacteria PROTEUS MIRABILIS  Final   Organism ID, Bacteria ENTEROCOCCUS FAECALIS  Final      Susceptibility   Enterococcus faecalis - MIC*    AMPICILLIN <=2 SENSITIVE Sensitive     VANCOMYCIN 1 SENSITIVE Sensitive     GENTAMICIN SYNERGY SENSITIVE Sensitive     * MODERATE ENTEROCOCCUS FAECALIS   Proteus mirabilis - MIC*    AMPICILLIN <=2 SENSITIVE Sensitive     CEFEPIME  <=0.12 SENSITIVE Sensitive     CEFTAZIDIME <=1 SENSITIVE Sensitive     CEFTRIAXONE <=0.25 SENSITIVE Sensitive     CIPROFLOXACIN <=0.25 SENSITIVE Sensitive     GENTAMICIN <=1 SENSITIVE Sensitive     IMIPENEM >=16 RESISTANT Resistant     TRIMETH/SULFA <=20 SENSITIVE Sensitive     AMPICILLIN/SULBACTAM <=2 SENSITIVE Sensitive     PIP/TAZO <=4 SENSITIVE Sensitive ug/mL    * MODERATE PROTEUS MIRABILIS    RADIOLOGY STUDIES/RESULTS: No results found.     LOS: 18 days   Donalda Applebaum, MD  [facility]    To contact the attending provider between 7A-7P or the covering provider during after hours 7P-7A, please log into the web site [email or web address] and access using universal [place] password for that web site. If you do not have the password, please call the hospital operator.  08/17/2023, 12:08 PM

## 2023-08-18 ENCOUNTER — Inpatient Hospital Stay (HOSPITAL_COMMUNITY): Admitting: Registered Nurse

## 2023-08-18 ENCOUNTER — Encounter (HOSPITAL_COMMUNITY)
Admission: EM | Disposition: A | Discharge status: Home / Self Care | Payer: Self-pay | Source: Home / Self Care | Attending: Internal Medicine

## 2023-08-18 ENCOUNTER — Inpatient Hospital Stay (HOSPITAL_COMMUNITY)

## 2023-08-18 DIAGNOSIS — N132 Hydronephrosis with renal and ureteral calculous obstruction: Secondary | ICD-10-CM | POA: Diagnosis not present

## 2023-08-18 DIAGNOSIS — Z87891 Personal history of nicotine dependence: Secondary | ICD-10-CM | POA: Diagnosis not present

## 2023-08-18 DIAGNOSIS — I10 Essential (primary) hypertension: Secondary | ICD-10-CM

## 2023-08-18 DIAGNOSIS — N1330 Unspecified hydronephrosis: Secondary | ICD-10-CM | POA: Diagnosis not present

## 2023-08-18 DIAGNOSIS — N201 Calculus of ureter: Secondary | ICD-10-CM | POA: Diagnosis not present

## 2023-08-18 DIAGNOSIS — K631 Perforation of intestine (nontraumatic): Secondary | ICD-10-CM | POA: Diagnosis not present

## 2023-08-18 HISTORY — PX: CYSTOSCOPY W/ URETERAL STENT PLACEMENT: SHX1429

## 2023-08-18 LAB — AEROBIC/ANAEROBIC CULTURE W GRAM STAIN (SURGICAL/DEEP WOUND)

## 2023-08-18 LAB — CBC
HCT: 25.8 % — ABNORMAL LOW (ref 36.0–46.0)
Hemoglobin: 8.2 g/dL — ABNORMAL LOW (ref 12.0–15.0)
MCH: 28.1 pg (ref 26.0–34.0)
MCHC: 31.8 g/dL (ref 30.0–36.0)
MCV: 88.4 fL (ref 80.0–100.0)
Platelets: 469 K/uL — ABNORMAL HIGH (ref 150–400)
RBC: 2.92 MIL/uL — ABNORMAL LOW (ref 3.87–5.11)
RDW: 15.8 % — ABNORMAL HIGH (ref 11.5–15.5)
WBC: 9.5 K/uL (ref 4.0–10.5)
nRBC: 0 % (ref 0.0–0.2)

## 2023-08-18 LAB — MAGNESIUM: Magnesium: 2.1 mg/dL (ref 1.7–2.4)

## 2023-08-18 SURGERY — CYSTOSCOPY, WITH RETROGRADE PYELOGRAM AND URETERAL STENT INSERTION
Anesthesia: General | Laterality: Left

## 2023-08-18 MED ORDER — ACETAMINOPHEN 500 MG PO TABS
1000.0000 mg | ORAL_TABLET | Freq: Four times a day (QID) | ORAL | Status: DC
  Administered 2023-08-19 – 2023-09-04 (×43): 1000 mg via ORAL
  Filled 2023-08-18 (×46): qty 2

## 2023-08-18 MED ORDER — FENTANYL CITRATE (PF) 250 MCG/5ML IJ SOLN
INTRAMUSCULAR | Status: DC | PRN
  Administered 2023-08-18 (×2): 50 ug via INTRAVENOUS

## 2023-08-18 MED ORDER — CHLORHEXIDINE GLUCONATE 0.12 % MT SOLN: 15.0000 mL | Freq: Once | OROMUCOSAL | Status: AC

## 2023-08-18 MED ORDER — TRAVASOL 10 % IV SOLN
INTRAVENOUS | Status: AC
  Filled 2023-08-18: qty 890.4

## 2023-08-18 MED ORDER — ACETAMINOPHEN 10 MG/ML IV SOLN
INTRAVENOUS | Status: DC | PRN
  Administered 2023-08-18: 1000 mg via INTRAVENOUS

## 2023-08-18 MED ORDER — OXYCODONE HCL 5 MG PO TABS
5.0000 mg | ORAL_TABLET | ORAL | Status: DC | PRN
  Administered 2023-08-18 – 2023-08-21 (×11): 10 mg via ORAL
  Administered 2023-08-22: 5 mg via ORAL
  Administered 2023-08-22 – 2023-08-30 (×10): 10 mg via ORAL
  Administered 2023-08-30: 5 mg via ORAL
  Administered 2023-08-30 – 2023-09-01 (×4): 10 mg via ORAL
  Filled 2023-08-18: qty 2
  Filled 2023-08-18: qty 1
  Filled 2023-08-18 (×10): qty 2
  Filled 2023-08-18: qty 1
  Filled 2023-08-18 (×15): qty 2

## 2023-08-18 MED ORDER — IOHEXOL 300 MG/ML  SOLN
INTRAMUSCULAR | Status: DC | PRN
  Administered 2023-08-18: 20 mL via URETHRAL

## 2023-08-18 MED ORDER — ACETAMINOPHEN 10 MG/ML IV SOLN
INTRAVENOUS | Status: AC
  Filled 2023-08-18: qty 100

## 2023-08-18 MED ORDER — SUCCINYLCHOLINE CHLORIDE 200 MG/10ML IV SOSY
PREFILLED_SYRINGE | INTRAVENOUS | Status: DC | PRN
  Administered 2023-08-18: 100 mg via INTRAVENOUS

## 2023-08-18 MED ORDER — ONDANSETRON HCL 4 MG/2ML IJ SOLN
INTRAMUSCULAR | Status: DC | PRN
  Administered 2023-08-18: 4 mg via INTRAVENOUS

## 2023-08-18 MED ORDER — SODIUM CHLORIDE 0.9 % IR SOLN
Status: DC | PRN
  Administered 2023-08-18: 3000 mL via INTRAVESICAL

## 2023-08-18 MED ORDER — SODIUM CHLORIDE 0.9 % IV SOLN
3.0000 g | Freq: Four times a day (QID) | INTRAVENOUS | Status: DC
  Administered 2023-08-18 – 2023-08-27 (×36): 3 g via INTRAVENOUS
  Filled 2023-08-18 (×35): qty 8

## 2023-08-18 MED ORDER — DEXAMETHASONE SODIUM PHOSPHATE 10 MG/ML IJ SOLN
INTRAMUSCULAR | Status: DC | PRN
  Administered 2023-08-18: 10 mg via INTRAVENOUS

## 2023-08-18 MED ORDER — LIDOCAINE HCL URETHRAL/MUCOSAL 2 % EX GEL
CUTANEOUS | Status: AC
Start: 2023-08-18 — End: 2023-08-18
  Filled 2023-08-18: qty 11

## 2023-08-18 MED ORDER — CHLORHEXIDINE GLUCONATE 0.12 % MT SOLN
OROMUCOSAL | Status: AC
  Administered 2023-08-18: 15 mL via OROMUCOSAL
  Filled 2023-08-18: qty 15

## 2023-08-18 MED ORDER — LIDOCAINE 2% (20 MG/ML) 5 ML SYRINGE
INTRAMUSCULAR | Status: DC | PRN
  Administered 2023-08-18: 100 mg via INTRAVENOUS

## 2023-08-18 MED ORDER — LACTATED RINGERS IV SOLN: INTRAVENOUS | Status: DC

## 2023-08-18 MED ORDER — MELATONIN 5 MG PO TABS
5.0000 mg | ORAL_TABLET | Freq: Every evening | ORAL | Status: DC | PRN
  Administered 2023-08-19 – 2023-08-31 (×5): 5 mg via ORAL
  Filled 2023-08-18 (×6): qty 1

## 2023-08-18 MED ORDER — TRAZODONE HCL 50 MG PO TABS
50.0000 mg | ORAL_TABLET | Freq: Every day | ORAL | Status: DC
  Administered 2023-08-18 – 2023-09-03 (×17): 50 mg via ORAL
  Filled 2023-08-18 (×18): qty 1

## 2023-08-18 MED ORDER — PROPOFOL 10 MG/ML IV BOLUS
INTRAVENOUS | Status: DC | PRN
  Administered 2023-08-18: 100 mg via INTRAVENOUS

## 2023-08-18 MED ORDER — FENTANYL CITRATE (PF) 250 MCG/5ML IJ SOLN
INTRAMUSCULAR | Status: AC
Start: 2023-08-18 — End: 2023-08-18
  Filled 2023-08-18: qty 5

## 2023-08-18 MED ORDER — PHENYLEPHRINE 80 MCG/ML (10ML) SYRINGE FOR IV PUSH (FOR BLOOD PRESSURE SUPPORT)
PREFILLED_SYRINGE | INTRAVENOUS | Status: DC | PRN
  Administered 2023-08-18 (×6): 120 ug via INTRAVENOUS

## 2023-08-18 MED ORDER — ORAL CARE MOUTH RINSE: 15.0000 mL | Freq: Once | OROMUCOSAL | Status: AC

## 2023-08-18 SURGICAL SUPPLY — 24 items
BAG DRAIN URO-CYSTO SKYTR STRL (DRAIN) ×1 IMPLANT
BASKET ZERO TIP NITINOL 2.4FR (BASKET) IMPLANT
BENZOIN TINCTURE PRP APPL 2/3 (GAUZE/BANDAGES/DRESSINGS) IMPLANT
CATH URETL OPEN END 6FR 70 (CATHETERS) ×1 IMPLANT
DRSG TEGADERM 4X4.75 (GAUZE/BANDAGES/DRESSINGS) IMPLANT
ELECTRODE REM PT RTRN 9FT ADLT (ELECTROSURGICAL) IMPLANT
FIBER LASER FLEXIVA 365 (UROLOGICAL SUPPLIES) IMPLANT
FIBER LASER TRACTIP 200 (UROLOGICAL SUPPLIES) IMPLANT
GLOVE BIO SURGEON STRL SZ7 (GLOVE) ×1 IMPLANT
GOWN STRL REUS W/ TWL LRG LVL3 (GOWN DISPOSABLE) ×1 IMPLANT
GUIDEWIRE STR DUAL SENSOR (WIRE) ×1 IMPLANT
GUIDEWIRE ZIPWRE .038 STRAIGHT (WIRE) IMPLANT
KIT TURNOVER KIT B (KITS) ×1 IMPLANT
MANIFOLD NEPTUNE II (INSTRUMENTS) ×1 IMPLANT
NS IRRIG 500ML POUR BTL (IV SOLUTION) ×1 IMPLANT
PACK CYSTO (CUSTOM PROCEDURE TRAY) ×1 IMPLANT
SHEATH NAVIGATOR HD 11/13X28 (SHEATH) IMPLANT
SHEATH NAVIGATOR HD 11/13X36 (SHEATH) IMPLANT
SHEATH NAVIGATOR HD 12/14X46 (SHEATH) IMPLANT
SLEEVE SCD COMPRESS KNEE MED (STOCKING) ×1 IMPLANT
SOL .9 NS 3000ML IRR UROMATIC (IV SOLUTION) ×2 IMPLANT
STENT CONTOUR 6FRX24X.038 (STENTS) IMPLANT
TUBE CONNECTING 12X1/4 (SUCTIONS) IMPLANT
TUBING UROLOGY SET (TUBING) ×1 IMPLANT

## 2023-08-18 NOTE — Transfer of Care (Signed)
 Immediate Anesthesia Transfer of Care Note  Patient: [name]  Procedure(s) Performed: CYSTOSCOPY, WITH RETROGRADE PYELOGRAM AND URETERAL STENT INSERTION (Left)  Patient Location: PACU  Anesthesia Type:General  Level of Consciousness: awake  Airway & Oxygen Therapy: Patient Spontanous Breathing  Post-op Assessment: Report given to RN and Post -op Vital signs reviewed and stable  Post vital signs: Reviewed and stable  Last Vitals:  Vitals Value Taken Time  BP 122/69 08/18/23 13:18  Temp    Pulse 72 08/18/23 13:20  Resp 16 08/18/23 13:20  SpO2 92 % 08/18/23 13:20  Vitals shown include unfiled device data.  Last Pain:  Vitals:   08/18/23 1123  TempSrc: Oral  PainSc:       Patients Stated Pain Goal: 4 (08/16/23 1004)  Complications: No notable events documented.

## 2023-08-18 NOTE — Progress Notes (Signed)
 [facility] night cross cover note:   General surgery's post-op documentation from today conveys that it is okay to a resume a clear liquid diet. Additionally, the patient has already completed the cystoscopy with left ureteral stent placement by urology today.   I subsequently ordered clear liquid diet for this patient, as above.     Eva Pore, DO Hospitalist

## 2023-08-18 NOTE — Progress Notes (Signed)
 Patient received to the unit in bed. A/O X 4. NG tube to low intermittent suctioning. Patient on telemetry. Family member at bedside

## 2023-08-18 NOTE — Progress Notes (Signed)
 PHARMACY - TOTAL PARENTERAL NUTRITION CONSULT NOTE  Indication: intolerance to feeding, recurrent ileus  Patient Measurements: Height: 5' 4 (162.6 cm) Weight: 62.7 kg (138 lb 3.7 oz) IBW/kg (Calculated) : 54.7 TPN AdjBW (KG): 57.2 Body mass index is 23.73 kg/m. Usual Weight: ~59 kg  Assessment:  Angela [name] s/p exp lap with Hartmann's procedure for stercoral colitis with ischemia and perforation with feculent peritonitis on 6/22. Noted pt has been NPO/clear liquid/full liquid diet since after surgery with limited intake. Pt now with ileus on Xray (dilated loops of small bowel) and N/V with liquid diet so NGT replaced 7/4, now NPO and starting TPN. Pt at risk of refeeding with prolonged period of little po intake since admission.  Glucose / Insulin : no hx DM - CBGs < 120, off insulin  Electrolytes: last labs 7/10: Na 133, K 4.2 (goal; >/= 4 for ileus), CoCa 9.9, 7/11 Mg 2.1 (received 2g, goal >/= 2 for ileus), others wnl Renal: SCr < 1, BUN WNL Hepatic: LFTs / TG / tbili WNL, albumin  < 1.5 [LFT's from 7/10] Intake / Output; MIVF: UOP 0.5 ml/kg/hr (incomplete charting), drain 161 ml, colostomy 0mL charted but MD noted trickle of loose stools in bag  GI Imaging: 7/5 abd xray - persistent dilated loops of small bowel 7/5 CT - pelvic fluid collection slightly enlarged, suspicious for peritonitis, partially obstructing calculus in L ureter, ileus GI Surgeries / Procedures:  6/22 ex-lap with Hartmann's procedure for stercoral colitis with ischemia and perf with feculent peritonitis 7/8 CT-guided RLQ abd abscess and transgluteal pelvic drains [>455mL tan colored fluid] 7/10 NGT removed   Central access: PICC placed 08/12/23 TPN start date: 08/12/23  Nutritional Goals: Goal TPN Angela ml/hr will provide 89g AA, 1660 kCal   RD Estimated Needs Total Energy Estimated Needs: 1650-1850 kcals Total Protein Estimated Needs: 80-90 g Total Fluid Estimated Needs: >/= 1.7 L  Current Nutrition:  TPN 7/10  CLD, Boost  7/11 NPO for urology stent procedure  Plan:  Continue TPN at goal rate of Angela ml/hr to provide 100% of needs Electrolytes in TPN: increase Na 125 mEq/L, decrease K 45 mEq/L, Ca 5 mEq/L, increase Mg 9 mEq/L, Phos 16 mmol/L, Cl:Ac 1:1 Add standard MVI and trace elements to TPN  Monitor TPN labs on Mon/Thurs and PRN  Jinnie Door, PharmD, BCPS, BCCP Clinical Pharmacist  Please check AMION for all [facility] phone numbers After 10:00 PM, call Main Pharmacy [phone]

## 2023-08-18 NOTE — Progress Notes (Signed)
 Physical Therapy Treatment Patient Details Name: Angela [name] MRN: [id number] DOB: 23-Jan-1951 Today's Date: 08/18/2023   History of Present Illness Angela [name] is a 73 y.o. female who presents with abdominal pain and bloating  Renal CT revealed interval development of intraperitoneal free air and moderate volume free fluid containing layering hyperdensities consistent with bowel perforation, distal left ureteral stone measuring 9 mm with interval decrease left hydroureteronephrosis. . Past medical history significant of hypertension, hyperlipidemia, glaucoma and prior tobacco abuse    PT Comments  Pt tolerated treatment well today. Pt with similar presentation to previous session. No change in DC/DME recs at this time. PT will continue to follow.     If plan is discharge home, recommend the following: A little help with walking and/or transfers;A little help with bathing/dressing/bathroom;Assistance with cooking/housework;Direct supervision/assist for medications management;Assist for transportation;Help with stairs or ramp for entrance   Can travel by private vehicle        Equipment Recommendations  Rolling walker (2 wheels)    Recommendations for Other Services       Precautions / Restrictions Precautions Precautions: Fall;Other (comment) Recall of Precautions/Restrictions: Intact Precaution/Restrictions Comments: Multiple JP drains now along with ostomy bag. NG tube has been pulled Restrictions Weight Bearing Restrictions Per Provider Order: No     Mobility  Bed Mobility Overal bed mobility: Needs Assistance Bed Mobility: Supine to Sit, Sit to Supine     Supine to sit: Min assist Sit to supine: Min assist   General bed mobility comments: Min A for BLE management back into bed. Family assisting pt back to bed.    Transfers Overall transfer level: Needs assistance Equipment used: Rolling walker (2 wheels) Transfers: Sit to/from Stand, Bed to  chair/wheelchair/BSC Sit to Stand: Contact guard assist   Step pivot transfers: Contact guard assist       General transfer comment: Cues for hand placement    Ambulation/Gait Ambulation/Gait assistance: Supervision, +2 safety/equipment Gait Distance (Feet): 200 Feet Assistive device: Rolling walker (2 wheels) Gait Pattern/deviations: Decreased stride length, Step-through pattern Gait velocity: decreased     General Gait Details: Slowed step through pattern. no LOB noted. +2 assistance for lines and leads.   Stairs             Wheelchair Mobility     Tilt Bed    Modified Rankin (Stroke Patients Only)       Balance Overall balance assessment: Needs assistance Sitting-balance support: Bilateral upper extremity supported, Feet supported Sitting balance-Leahy Scale: Good     Standing balance support: Bilateral upper extremity supported, During functional activity Standing balance-Leahy Scale: Fair Standing balance comment: Reliant on RW                            Communication Communication Communication: No apparent difficulties  Cognition Arousal: Alert Behavior During Therapy: WFL for tasks assessed/performed   PT - Cognitive impairments: No apparent impairments                         Following commands: Intact      Cueing Cueing Techniques: Verbal cues  Exercises      General Comments General comments (skin integrity, edema, etc.): VSS      Pertinent Vitals/Pain Pain Assessment Faces Pain Scale: Hurts a little bit Pain Location: abdomen Pain Descriptors / Indicators: Discomfort Pain Intervention(s): Monitored during session    Home Living  Prior Function            PT Goals (current goals can now be found in the care plan section) Progress towards PT goals: Progressing toward goals    Frequency    Min 3X/week      PT Plan      Co-evaluation               AM-PAC PT 6 Clicks Mobility   Outcome Measure  Help needed turning from your back to your side while in a flat bed without using bedrails?: A Little Help needed moving from lying on your back to sitting on the side of a flat bed without using bedrails?: A Little Help needed moving to and from a bed to a chair (including a wheelchair)?: A Little Help needed standing up from a chair using your arms (e.g., wheelchair or bedside chair)?: A Little Help needed to walk in hospital room?: A Little Help needed climbing 3-5 steps with a railing? : A Little 6 Click Score: 18    End of Session Equipment Utilized During Treatment: Gait belt Activity Tolerance: Patient tolerated treatment well Patient left: in bed;with call bell/phone within reach;with family/visitor present Nurse Communication: Mobility status PT Visit Diagnosis: Other abnormalities of gait and mobility (R26.89)     Time: 9089-9070 PT Time Calculation (min) (ACUTE ONLY): 19 min  Charges:    $Gait Training: 8-22 mins PT General Charges $$ ACUTE PT VISIT: 1 Visit                     Helon Wisinski B, PT, DPT Acute Rehab Services [phone]    Martin Belling 08/18/2023, 4:10 PM

## 2023-08-18 NOTE — Progress Notes (Addendum)
 PROGRESS NOTE        PATIENT DETAILS Name: Angela [name] Age: 73 y.o. Sex: female Date of Birth: Dec 27, 1950 Admit Date: 07/30/2023 Admitting Physician Maximino DELENA Sharps, MD ERE:[id number], [name], DO  Brief Summary: Patient is a 73 y.o.  female with history of HTN, HLD who presented with worsening abdominal pain-patient was found to have sigmoid perforation with feculent peritonitis in the setting of stercoral colitis.  Patient underwent sigmoidectomy with end colostomy on 6/22-unfortunately postop course complicated by ileus and pelvic residual abscesses requiring IR drain placement.    Significant events: 6/21>> evaluated in the ED for abdominal pain-found to have left mid ureteral stone-9 mm with hydronephrosis-and large volume of stool-managed with supportive care and discharged home. 6/22>> worsening abdominal pain-repeat CT scan consistent with bowel perforation-surgery consult-admit to [facility]. 6/29>> CT abdomen with pelvic abscess. 6/30>> CT-guided drain and pelvic abscess by IR 7/04>> vomiting-recurrent ileus-NG tube reinserted 7/05>> TNA started-repeat CT abdomen increased size of abscess. 7/07>> IR placed RLQ drain and right transgluteal pelvic drain. 7/10>> NGT removed  Significant studies: 6/22>> CT renal stone study: Interval development of intraperitoneal free air-moderate volume free fluid-consistent with bowel perforation, persistent large volume stool with dilated sigmoid colon.  Similar location of left ureteral stone-9 mm. 6/29>> CT abdomen/pelvis: S/p partial colectomy-LLQ ostomy-complex free fluid with multiple foci of air-most pronounced in the surgical bed-possibility of superimposed infection or bowel leak cannot be excluded. 6/30>> echo: EF 60-65%. 7/05>> CT abdomen/pelvis: Previous collection with pigtail has increased in size, other less well-defined but enlarging peritoneal fluid collections.  Persistent-partially obstructing 11 mm calculus  in the distal left ureter.  Persistent fluid loops of dilated small bowel-likely ileus.  Significant microbiology data: 6/22>> urine culture: No growth 6/22>> blood culture: No growth 6/30>> pelvic abscess culture: Gram-negative rods/gram-positive cocci-mixed anaerobic flora. 7/07>> Pelvic abscess culture:moderate proteus and enterococcus  Procedures: 6/22>> sigmoidectomy with end colostomy. 6/30>> CT-guided drain and pelvic abscess by IR  Consults: General Surgery IR Urology  Subjective: Feels better-NG tube removed yesterday.  Claims had multiple output from ostomy throughout the night.  Objective: Vitals: Blood pressure (!) 133/91, pulse 84, temperature 97.7 F (36.5 C), temperature source Oral, resp. rate 20, height 5' 4 (1.626 m), weight 62.7 kg, SpO2 98%.   Exam: Awake/alert Chest: Clear to auscultation CVS: S1-S2 regular Abdomen: Soft-mildly tender-dressing intact-not soaked. Extremities: Trace edema Nonfocal exam  Pertinent Labs/Radiology:    Latest Ref Rng & Units 08/18/2023    3:45 AM 08/17/2023    4:43 AM 08/15/2023    4:31 AM  CBC  WBC 4.0 - 10.5 K/uL 9.5  8.7  5.7   Hemoglobin 12.0 - 15.0 g/dL 8.2  8.0  7.3   Hematocrit 36.0 - 46.0 % 25.8  25.5  23.2   Platelets 150 - 400 K/uL 469  513  557     Lab Results  Component Value Date   NA 133 (L) 08/17/2023   K 4.2 08/17/2023   CL 103 08/17/2023   CO2 25 08/17/2023     Assessment/Plan: Sepsis secondary to stercoral colitis with ischemia/perforation of sigmoid colon with feculent peritonitis-s/p sigmoid colectomy/ostomy on 6/22-complicated by postoperative ileus and then residual pelvic abscess requiring CT-guided drain placement by on 6/30. Ileus seems to be slowly improving-multiple output in ostomy bag overnight.  NG tube removed yesterday Remains n.p.o. for ureteral stent placement later  today. Remains on Zosyn  General Surgery following-will await further recommendations. Continues to be on TNA.     Coffee-ground NG tube emesis Seems to have resolved-no longer coffee-ground emesis-bile appearing fluid in canister. Continue PPI Hb now improving-continue to follow. If overt GI bleeding occurs-will need to get GI input.  If hemoglobin drops significantly-needs PRBC transfusion.  Hydronephrosis secondary to distal left ureteral stone Urology following-with recommendations for supportive care for now-now that her medical problems have more or less stabilized-patient is scheduled for ureteral stent placement today.  Hospital delirium Secondary to acute illness/narcotics Completely awake/alert this morning Maintain delirium precautions  Normocytic anemia Due to acute illness,+/- coffee-ground emesis on 7/7 Hb stable and better today. Follow.  Thrombocytosis Likely reactive Supportive care  Hypomagnesemia/hypophosphatemia/hypokalemia Secondary to NG tube suctioning Resolved.  HTN BP stable-all oral antihypertensives on hold due to n.p.o. status Continue with as needed IV hydralazine  and metoprolol .   Lower extremity edema Secondary to hypoalbuminemia/IV fluid resuscitation in the setting of acute illness Echo stable-UA negative for proteinuria Will start diuretics once she is a bit more stable.  Chronic idiopathic constipation Bowel function recovering-brown stools Once more stable-will need to be placed on a bowel regimen.  Odynophagia secondary to oral/aphthous ulcers Improved on Valtrex and Magic mouthwash Supportive care  Glaucoma Timolol  eyedrops  Insomnia Appears to be primarily due to pain-anxiety Schedule trazodone  at night If no response-use IV Benadryl   Debility/deconditioning PT OT eval-Home health recommended.  Code status:   Code Status: Full Code   DVT Prophylaxis: enoxaparin  (LOVENOX ) injection 40 mg Start: 08/17/23 1000 Place TED hose Start: 08/09/23 1000 SCDs Start: 07/30/23 1954 SCDs Start: 07/30/23 1953   Family Communication: None  at bedside.   Disposition Plan: Status is: Inpatient Remains inpatient appropriate because: Severity of illness   Planned Discharge Destination:Home health   Diet: Diet Order             Diet NPO time specified Except for: Sips with Meds  Diet effective midnight                     Antimicrobial agents: Anti-infectives (From admission, onward)    Start     Dose/Rate Route Frequency Ordered Stop   08/06/23 1130  acyclovir  (ZOVIRAX ) 200 MG capsule 400 mg  Status:  Discontinued        400 mg Oral 3 times daily 08/06/23 1037 08/08/23 1424   08/06/23 1100  piperacillin -tazobactam (ZOSYN ) IVPB 3.375 g        3.375 g 12.5 mL/hr over 240 Minutes Intravenous Every 8 hours 08/06/23 1051     07/30/23 1800  piperacillin -tazobactam (ZOSYN ) IVPB 3.375 g        3.375 g 12.5 mL/hr over 240 Minutes Intravenous Every 8 hours 07/30/23 1332 08/04/23 2359   07/30/23 1400  piperacillin -tazobactam (ZOSYN ) IVPB 3.375 g  Status:  Discontinued        3.375 g 100 mL/hr over 30 Minutes Intravenous Every 8 hours 07/30/23 1329 07/30/23 1332   07/30/23 1015  piperacillin -tazobactam (ZOSYN ) IVPB 3.375 g        3.375 g 100 mL/hr over 30 Minutes Intravenous  Once 07/30/23 1013 07/30/23 1311        MEDICATIONS: Scheduled Meds:  acetaminophen   1,000 mg Per Tube Q6H   bisacodyl   10 mg Rectal Daily   Chlorhexidine  Gluconate Cloth  6 each Topical Daily   enoxaparin  (LOVENOX ) injection  40 mg Subcutaneous Q24H   feeding supplement  1 Container Oral TID BM  latanoprost   1 drop Both Eyes QHS   magic mouthwash  5 mL Oral QID   methocarbamol   500 mg Oral TID   Or   methocarbamol  (ROBAXIN ) injection  500 mg Intravenous TID   pantoprazole  (PROTONIX ) IV  40 mg Intravenous Q12H   sodium chloride  flush  3 mL Intravenous Q12H   sodium chloride  flush  5 mL Intracatheter Q8H   sodium chloride  flush  5 mL Intracatheter Q8H   timolol   1 drop Both Eyes BID   traZODone   50 mg Per Tube QHS   Continuous  Infusions:  piperacillin -tazobactam (ZOSYN )  IV 3.375 g (08/18/23 0109)   TPN ADULT (ION) 75 mL/hr at 08/17/23 1717   PRN Meds:.albuterol , diphenhydrAMINE , hydrALAZINE , HYDROmorphone  (DILAUDID ) injection, melatonin, metoprolol  tartrate, ondansetron  **OR** ondansetron  (ZOFRAN ) IV, mouth rinse, oxyCODONE , phenol, sodium chloride  flush   I have personally reviewed following labs and imaging studies  LABORATORY DATA: CBC: Recent Labs  Lab 08/12/23 0401 08/14/23 0130 08/15/23 0431 08/17/23 0443 08/18/23 0345  WBC 6.7 5.9 5.7 8.7 9.5  HGB 9.0* 7.2* 7.3* 8.0* 8.2*  HCT 27.1* 23.2* 23.2* 25.5* 25.8*  MCV 88.0 91.3 90.6 89.5 88.4  PLT 787* 639* 557* 513* 469*    Basic Metabolic Panel: Recent Labs  Lab 08/13/23 0325 08/14/23 0130 08/15/23 0431 08/16/23 0350 08/17/23 0443 08/18/23 0345  NA 138 137 137 135 133*  --   K 2.8* 3.3* 4.0 4.3 4.2  --   CL 98 100 105 103 103  --   CO2 31 30 27 25 25   --   GLUCOSE 137* 144* 124* 125* 104*  --   BUN 17 17 14 15 18   --   CREATININE 0.50 0.41* 0.43* 0.58 0.74  --   CALCIUM  7.3* 7.5* 7.5* 7.8* 7.8*  --   MG 1.7 1.9 1.8 2.2 1.9 2.1  PHOS 2.1* 2.0* 2.9 3.2 3.4  --     GFR: Estimated Creatinine Clearance: 56.5 mL/min (by C-G formula based on SCr of 0.74 mg/dL).  Liver Function Tests: Recent Labs  Lab 08/14/23 0130 08/17/23 0443  AST 13* 17  ALT 11 9  ALKPHOS 54 60  BILITOT 0.3 0.5  PROT 4.8* 5.2*  ALBUMIN  <1.5* <1.5*   No results for input(s): LIPASE, AMYLASE in the last 168 hours. No results for input(s): AMMONIA in the last 168 hours.  Coagulation Profile: Recent Labs  Lab 08/15/23 0431  INR 1.1     Cardiac Enzymes: No results for input(s): CKTOTAL, CKMB, CKMBINDEX, TROPONINI in the last 168 hours.  BNP (last 3 results) No results for input(s): PROBNP in the last 8760 hours.  Lipid Profile: No results for input(s): CHOL, HDL, LDLCALC, TRIG, CHOLHDL, LDLDIRECT in the last 72  hours.   Thyroid  Function Tests: No results for input(s): TSH, T4TOTAL, FREET4, T3FREE, THYROIDAB in the last 72 hours.  Anemia Panel: No results for input(s): VITAMINB12, FOLATE, FERRITIN, TIBC, IRON, RETICCTPCT in the last 72 hours.  Urine analysis:    Component Value Date/Time   COLORURINE STRAW (A) 08/06/2023 1123   APPEARANCEUR CLEAR 08/06/2023 1123   LABSPEC 1.009 08/06/2023 1123   PHURINE 6.0 08/06/2023 1123   GLUCOSEU NEGATIVE 08/06/2023 1123   HGBUR NEGATIVE 08/06/2023 1123   BILIRUBINUR NEGATIVE 08/06/2023 1123   KETONESUR 5 (A) 08/06/2023 1123   PROTEINUR NEGATIVE 08/06/2023 1123   NITRITE NEGATIVE 08/06/2023 1123   LEUKOCYTESUR NEGATIVE 08/06/2023 1123    Sepsis Labs: Lactic Acid, Venous    Component Value Date/Time   LATICACIDVEN  1.8 07/30/2023 1213    MICROBIOLOGY: Recent Results (from the past 240 hours)  Aerobic/Anaerobic Culture w Gram Stain (surgical/deep wound)     Status: None (Preliminary result)   Collection Time: 08/14/23  6:13 PM   Specimen: Abscess  Result Value Ref Range Status   Specimen Description ABSCESS ABDOMEN  Final   Special Requests RLQ  Final   Gram Stain   Final    ABUNDANT WBC PRESENT, PREDOMINANTLY PMN ABUNDANT GRAM NEGATIVE RODS ABUNDANT GRAM POSITIVE COCCI IN PAIRS    Culture   Final    MODERATE PROTEUS MIRABILIS MODERATE ENTEROCOCCUS FAECALIS CULTURE REINCUBATED FOR BETTER GROWTH ABUNDANT BACTEROIDES THETAIOTAOMICRON BETA LACTAMASE POSITIVE Performed at [facility], [address] [place], [place] [address]    Report Status PENDING  Incomplete   Organism ID, Bacteria PROTEUS MIRABILIS  Final   Organism ID, Bacteria ENTEROCOCCUS FAECALIS  Final      Susceptibility   Enterococcus faecalis - MIC*    AMPICILLIN  <=2 SENSITIVE Sensitive     VANCOMYCIN 1 SENSITIVE Sensitive     GENTAMICIN SYNERGY SENSITIVE Sensitive     * MODERATE ENTEROCOCCUS FAECALIS   Proteus mirabilis - MIC*     AMPICILLIN  <=2 SENSITIVE Sensitive     CEFEPIME  <=0.12 SENSITIVE Sensitive     CEFTAZIDIME <=1 SENSITIVE Sensitive     CEFTRIAXONE <=0.25 SENSITIVE Sensitive     CIPROFLOXACIN <=0.25 SENSITIVE Sensitive     GENTAMICIN <=1 SENSITIVE Sensitive     IMIPENEM >=16 RESISTANT Resistant     TRIMETH/SULFA <=20 SENSITIVE Sensitive     AMPICILLIN /SULBACTAM <=2 SENSITIVE Sensitive     PIP/TAZO <=4 SENSITIVE Sensitive ug/mL    * MODERATE PROTEUS MIRABILIS    RADIOLOGY STUDIES/RESULTS: No results found.     LOS: 19 days   Donalda Applebaum, MD  [facility]    To contact the attending provider between 7A-7P or the covering provider during after hours 7P-7A, please log into the web site [email or web address] and access using universal [place] password for that web site. If you do not have the password, please call the hospital operator.  08/18/2023, 6:16 AM

## 2023-08-18 NOTE — Anesthesia Procedure Notes (Signed)
 Procedure Name: Intubation Date/Time: 08/18/2023 12:34 PM  Performed by: Virgil Ee, CRNAPre-anesthesia Checklist: Patient identified, Patient being monitored, Timeout performed, Emergency Drugs available and Suction available Patient Re-evaluated:Patient Re-evaluated prior to induction Oxygen Delivery Method: Circle system utilized Preoxygenation: Pre-oxygenation with 100% oxygen Induction Type: IV induction Ventilation: Mask ventilation without difficulty Laryngoscope Size: 3 and Glidescope Grade View: Grade I Tube type: Oral Tube size: 7.0 mm Number of attempts: 1 Airway Equipment and Method: Stylet Placement Confirmation: ETT inserted through vocal cords under direct vision, positive ETCO2 and breath sounds checked- equal and bilateral Secured at: 21 cm Tube secured with: Tape Dental Injury: Teeth and Oropharynx as per pre-operative assessment

## 2023-08-18 NOTE — Progress Notes (Signed)
 * Day of Surgery *   Subjective/Chief Complaint: Patient tolerating small volume clears prior to midnight last night, minimal nausea. Having a little more output from stoma. Going to OR with urology today.    Objective: Vital signs in last 24 hours: Temp:  [97.4 F (36.3 C)-98.1 F (36.7 C)] 98.1 F (36.7 C) (07/11 1123) Pulse Rate:  [68-84] 69 (07/11 1123) Resp:  [15-20] 17 (07/11 1123) BP: (117-146)/(71-97) 123/71 (07/11 1123) SpO2:  [96 %-99 %] 97 % (07/11 1123) Weight:  [61.2 kg] 61.2 kg (07/11 1123) Last BM Date : 08/16/23  Intake/Output from previous day: 07/10 0701 - 07/11 0700 In: 624.6 [I.V.:524.6; IV Piggyback:99.9] Out: 961 [Urine:800; Drains:161] Intake/Output this shift: No intake/output data recorded.    Lab Results:  Gen: NAD Lungs: effort nonlabored  Abd: soft, colostomy viable and with a little gas and liquid stool present.  Mild distention. Midline dressing c/d/i . IR drains with purulent fluid.   BMET Recent Labs    08/16/23 0350 08/17/23 0443  NA 135 133*  K 4.3 4.2  CL 103 103  CO2 25 25  GLUCOSE 125* 104*  BUN 15 18  CREATININE 0.58 0.74  CALCIUM  7.8* 7.8*   PT/INR No results for input(s): LABPROT, INR in the last 72 hours.  ABG No results for input(s): PHART, HCO3 in the last 72 hours.  Invalid input(s): PCO2, PO2  Studies/Results: No results found.   Anti-infectives: Anti-infectives (From admission, onward)    Start     Dose/Rate Route Frequency Ordered Stop   08/18/23 1800  [MAR Hold]  Ampicillin -Sulbactam (UNASYN ) 3 g in sodium chloride  0.9 % 100 mL IVPB        (MAR Hold since Fri 08/18/2023 at 1122.Hold Reason: Transfer to a Procedural area)   3 g 200 mL/hr over 30 Minutes Intravenous Every 6 hours 08/18/23 0922     08/06/23 1130  acyclovir  (ZOVIRAX ) 200 MG capsule 400 mg  Status:  Discontinued        400 mg Oral 3 times daily 08/06/23 1037 08/08/23 1424   08/06/23 1100  piperacillin -tazobactam (ZOSYN ) IVPB  3.375 g        3.375 g 12.5 mL/hr over 240 Minutes Intravenous Every 8 hours 08/06/23 1051 08/18/23 1400   07/30/23 1800  piperacillin -tazobactam (ZOSYN ) IVPB 3.375 g        3.375 g 12.5 mL/hr over 240 Minutes Intravenous Every 8 hours 07/30/23 1332 08/04/23 2359   07/30/23 1400  piperacillin -tazobactam (ZOSYN ) IVPB 3.375 g  Status:  Discontinued        3.375 g 100 mL/hr over 30 Minutes Intravenous Every 8 hours 07/30/23 1329 07/30/23 1332   07/30/23 1015  piperacillin -tazobactam (ZOSYN ) IVPB 3.375 g        3.375 g 100 mL/hr over 30 Minutes Intravenous  Once 07/30/23 1013 07/30/23 1311       Assessment/Plan: Procedure(s): LAPAROTOMY, EXPLORATORY (N/A) COLECTOMY, SIGMOID, OPEN s/p Procedure(s): LAPAROTOMY, EXPLORATORY (N/A) COLECTOMY, SIGMOID, OPEN POD 19, s/p ex lap with Hartmann's procedure for stercoral colitis with ischemia and perforation with feculent peritonitis, Dr. Polly 6/22  - ok to resume CLD after procedure today - continue stomal suppositories  - CT scan (7/5) shows collection remains sizable and has slightly enlarged compared with the previous study. This abuts the rectal suture line and a leak in this area cannot be excluded. - IR repositioned drains, both purulent. Cx with proteus mirabilis and enterococcus both sensitive to zosyn . Consider repeat scan when drain output decreases  - Continue  TNA - conitnue to mobilize as able. - BID dressing changes to midline wound  - WOC following - pulm toilet  - BID PPI  FEN - NPO for OR, TPN  VTE - lovenox   ID - zosyn  6/29>>   - per [facility] -  Nephrolithiasis - per urology,  Chronic constipation - bowel regimen HTN Glaucoma  LOS: 19 days   Discussed plan of care with patient and family at bedside  Burnard JONELLE Louder, [facility] 08/18/2023, 11:41 AM Please see Amion for pager number during day hours 7:00am-4:30pm

## 2023-08-18 NOTE — Plan of Care (Signed)

## 2023-08-18 NOTE — Anesthesia Postprocedure Evaluation (Signed)
 Anesthesia Post Note  Patient: Angela [name]  Procedure(s) Performed: CYSTOSCOPY, WITH RETROGRADE PYELOGRAM AND URETERAL STENT INSERTION (Left)     Patient location during evaluation: PACU Anesthesia Type: General Level of consciousness: awake and alert Pain management: pain level controlled Vital Signs Assessment: post-procedure vital signs reviewed and stable Respiratory status: spontaneous breathing, nonlabored ventilation, respiratory function stable and patient connected to nasal cannula oxygen Cardiovascular status: blood pressure returned to baseline and stable Postop Assessment: no apparent nausea or vomiting Anesthetic complications: no   No notable events documented.  Last Vitals:  Vitals:   08/18/23 1330 08/18/23 1400  BP:    Pulse:    Resp:    Temp: 36.7 C 36.7 C  SpO2: 94%     Last Pain:  Vitals:   08/18/23 1400  TempSrc:   PainSc: 0-No pain                 Rome Ade

## 2023-08-18 NOTE — Progress Notes (Signed)
 OT Cancellation Note  Patient Details Name: Angela [name] MRN: [id number] DOB: [birth year]   Cancelled Treatment:    Reason Eval/Treat Not Completed: Patient at procedure or test/ unavailable (Pt in surgery.)  Kennth Mliss Helling 08/18/2023, 11:52 AM Mliss HERO, OTR/L Acute Rehabilitation Services Office: [phone]

## 2023-08-18 NOTE — Plan of Care (Signed)

## 2023-08-18 NOTE — Interval H&P Note (Signed)
 History and Physical Interval Note:  08/18/2023 11:51 AM  Arland GORMAN March  has presented today for surgery, with the diagnosis of LEFT URETERAL STONE.  The various methods of treatment have been discussed with the patient and family. After consideration of risks, benefits and other options for treatment, the patient has consented to  Procedure(s): CYSTOSCOPY, WITH RETROGRADE PYELOGRAM AND URETERAL STENT INSERTION (Left) as a surgical intervention.  The patient's history has been reviewed, patient examined, no change in status, stable for surgery.  I have reviewed the patient's chart and labs.  Questions were answered to the patient's satisfaction.     Adorian Gwynne L Honest Safranek

## 2023-08-18 NOTE — Op Note (Signed)
 Operative Note  Preoperative diagnosis:  1.  Left ureteral obstructing ureteral stone  Postoperative diagnosis: 1.  same  Procedure(s): 1.  Cystoscopy with left ureteral stent placement 2. left retrograde pyelogram with interpretation 3. Fluoroscopy <1 hour with intraoperative interpretation  Surgeon: Herlene Foot, MD  Assistants:  None  Anesthesia:  General  Complications:  None  EBL:  minimal  Specimens: 1. None  Drains/Catheters: 1.  Left 6Fr x 24cm ureteral stent  Intraoperative findings:   Cystoscopy demonstrated no suspicious lesions, masses, stones or other pathology. Retrograde pyelogram demonstrated left hydronephrosis and a filling defect in hte distal ureter consistent with the stone Successful left ureteral stent placement with curl in the renal pelvis and bladder respectively.  Indication:  Angela [name] is a 73 y.o. female with the above dx. After reviewing the management options for treatment, she elected to proceed with the above surgical procedure(s). We have discussed the potential benefits and risks of the procedure, side effects of the proposed treatment, the likelihood of the patient achieving the goals of the procedure, and any potential problems that might occur during the procedure or recuperation. Informed consent has been obtained.  Description of procedure: The patient was taken to the operating room and general anesthesia was induced.  The patient was placed in the dorsal lithotomy position, prepped and draped in the usual sterile fashion, and preoperative antibiotics were administered. A preoperative time-out was performed.   Cystourethroscopy was performed.  The patient's urethra was examined and was normal. The bladder was then systematically examined in its entirety. There was no evidence for any bladder tumors, stones, or other mucosal pathology.    Attention then turned to the left ureteral orifice. A 0.038 zip wire was passed through the  left orifice and over the wire a 5 Fr open ended catheter was inserted and passed up to the level of the renal pelvis. Omnipaque  contrast was injected through the ureteral catheter and a retrograde pyelogram was performed with findings as dictated above. The wire was then replaced and the open ended catheter was removed.   A 6Fr x 24 cm ureteral stent was advance over the wire. The stent was positioned appropriately under fluoroscopic and cystoscopic guidance.  The wire was then removed with an adequate stent curl noted in the renal pelvis as well as in the bladder.  The bladder was then emptied and the procedure ended.  The patient appeared to tolerate the procedure well and without complications.  The patient was able to be awakened and transferred to the recovery unit in satisfactory condition.   Plan:  return to floor Will arrange for ureteroscopy once over acute issues  KANDICE Herlene Foot MD [facility]  Pager: [phone]

## 2023-08-18 NOTE — Anesthesia Preprocedure Evaluation (Signed)
 Anesthesia Evaluation  Patient identified by MRN, date of birth, ID band Patient awake    Reviewed: Allergy & Precautions, NPO status , Patient's Chart, lab work & pertinent test results  History of Anesthesia Complications Negative for: history of anesthetic complications  Airway Mallampati: II  TM Distance: >3 FB Neck ROM: Full    Dental  (+) Dental Advisory Given   Pulmonary neg pulmonary ROS, neg sleep apnea, neg COPD, Patient abstained from smoking.Not current smoker, former smoker   breath sounds clear to auscultation       Cardiovascular Exercise Tolerance: Good METShypertension, Pt. on medications (-) CAD and (-) Past MI (-) dysrhythmias  Rhythm:Regular Rate:Normal     Neuro/Psych negative neurological ROS  negative psych ROS   GI/Hepatic Neg liver ROS,neg GERD  ,,S/p abdominal surgery for perforated viscus. Patient still on TPN with delayed return of bowel function. Denies nausea or vomiting today   Endo/Other  neg diabetes    Renal/GU Renal disease (9mm left ureteral stone)negative Renal ROS     Musculoskeletal  (+) Arthritis ,    Abdominal  (+)  Abdomen: tender.   Peds  Hematology   Anesthesia Other Findings Past Medical History: No date: Arthritis No date: Blood in stool     Comment:  greater than 4 years since last incidence of blood in               stool  No date: Colon polyps No date: Glaucoma No date: Hyperlipidemia No date: Hypertension 2021: Inclusion cyst     Comment:  face 2006: PNA (pneumonia) 04/16/2019: Strain of left wrist  Reproductive/Obstetrics                              Anesthesia Physical Anesthesia Plan  ASA: 3  Anesthesia Plan: General   Post-op Pain Management:    Induction: Intravenous and Rapid sequence  PONV Risk Score and Plan: 3 and Ondansetron  and Dexamethasone   Airway Management Planned: Oral ETT  Additional Equipment:  None  Intra-op Plan:   Post-operative Plan: Extubation in OR  Informed Consent: I have reviewed the patients History and Physical, chart, labs and discussed the procedure including the risks, benefits and alternatives for the proposed anesthesia with the patient or authorized representative who has indicated his/her understanding and acceptance.     Dental advisory given  Plan Discussed with: CRNA and Surgeon  Anesthesia Plan Comments: (Discussed risks of anesthesia with patient, including PONV, sore throat, lip/dental/eye damage. Rare risks discussed as well, such as cardiorespiratory and neurological sequelae, and allergic reactions. Discussed the role of CRNA in patient's perioperative care. Patient understands.)        Anesthesia Quick Evaluation

## 2023-08-19 ENCOUNTER — Encounter (HOSPITAL_COMMUNITY): Payer: Self-pay | Admitting: Urology

## 2023-08-19 DIAGNOSIS — K631 Perforation of intestine (nontraumatic): Secondary | ICD-10-CM | POA: Diagnosis not present

## 2023-08-19 LAB — CBC WITH DIFFERENTIAL/PLATELET
Abs Immature Granulocytes: 1.16 K/uL — ABNORMAL HIGH (ref 0.00–0.07)
Basophils Absolute: 0.1 K/uL (ref 0.0–0.1)
Basophils Relative: 1 %
Eosinophils Absolute: 0 K/uL (ref 0.0–0.5)
Eosinophils Relative: 0 %
HCT: 23.9 % — ABNORMAL LOW (ref 36.0–46.0)
Hemoglobin: 7.6 g/dL — ABNORMAL LOW (ref 12.0–15.0)
Immature Granulocytes: 13 %
Lymphocytes Relative: 19 %
Lymphs Abs: 1.7 K/uL (ref 0.7–4.0)
MCH: 28.3 pg (ref 26.0–34.0)
MCHC: 31.8 g/dL (ref 30.0–36.0)
MCV: 88.8 fL (ref 80.0–100.0)
Monocytes Absolute: 0.7 K/uL (ref 0.1–1.0)
Monocytes Relative: 8 %
Neutro Abs: 5.1 K/uL (ref 1.7–7.7)
Neutrophils Relative %: 59 %
Platelets: 416 K/uL — ABNORMAL HIGH (ref 150–400)
RBC: 2.69 MIL/uL — ABNORMAL LOW (ref 3.87–5.11)
RDW: 15.8 % — ABNORMAL HIGH (ref 11.5–15.5)
WBC: 8.8 K/uL (ref 4.0–10.5)
nRBC: 0 % (ref 0.0–0.2)

## 2023-08-19 LAB — COMPREHENSIVE METABOLIC PANEL WITH GFR
ALT: 10 U/L (ref 0–44)
AST: 15 U/L (ref 15–41)
Albumin: 1.6 g/dL — ABNORMAL LOW (ref 3.5–5.0)
Alkaline Phosphatase: 65 U/L (ref 38–126)
Anion gap: 8 (ref 5–15)
BUN: 18 mg/dL (ref 8–23)
CO2: 26 mmol/L (ref 22–32)
Calcium: 8.1 mg/dL — ABNORMAL LOW (ref 8.9–10.3)
Chloride: 102 mmol/L (ref 98–111)
Creatinine, Ser: 0.55 mg/dL (ref 0.44–1.00)
GFR, Estimated: >60 mL/min (ref >60)
Glucose, Bld: 125 mg/dL — ABNORMAL HIGH (ref 70–99)
Potassium: 3.9 mmol/L (ref 3.5–5.1)
Sodium: 136 mmol/L (ref 135–145)
Total Bilirubin: 0.3 mg/dL (ref 0.0–1.2)
Total Protein: 5.2 g/dL — ABNORMAL LOW (ref 6.5–8.1)

## 2023-08-19 LAB — TYPE AND SCREEN
ABO/RH(D): O POS
Antibody Screen: NEGATIVE

## 2023-08-19 LAB — PHOSPHORUS: Phosphorus: 3.7 mg/dL (ref 2.5–4.6)

## 2023-08-19 LAB — GLUCOSE, CAPILLARY
Glucose-Capillary: 104 mg/dL — ABNORMAL HIGH (ref 70–99)
Glucose-Capillary: 104 mg/dL — ABNORMAL HIGH (ref 70–99)
Glucose-Capillary: 127 mg/dL — ABNORMAL HIGH (ref 70–99)
Glucose-Capillary: 130 mg/dL — ABNORMAL HIGH (ref 70–99)

## 2023-08-19 LAB — MAGNESIUM: Magnesium: 1.9 mg/dL (ref 1.7–2.4)

## 2023-08-19 LAB — PROCALCITONIN: Procalcitonin: <0.1 ng/mL

## 2023-08-19 LAB — C-REACTIVE PROTEIN: CRP: 2.6 mg/dL — ABNORMAL HIGH (ref <1.0)

## 2023-08-19 MED ORDER — TRAVASOL 10 % IV SOLN
INTRAVENOUS | Status: AC
  Filled 2023-08-19: qty 890.4

## 2023-08-19 NOTE — Progress Notes (Signed)
 PROGRESS NOTE        PATIENT DETAILS Name: Angela [name] Age: 73 y.o. Sex: female Date of Birth: 1950-06-08 Admit Date: 07/30/2023 Admitting Physician Maximino DELENA Sharps, MD ERE:[id number], [name], DO  Brief Summary: Patient is a 73 y.o.  female with history of HTN, HLD who presented with worsening abdominal pain-patient was found to have sigmoid perforation with feculent peritonitis in the setting of stercoral colitis.  Patient underwent sigmoidectomy with end colostomy on 6/22-unfortunately postop course complicated by ileus and pelvic residual abscesses requiring IR drain placement.    Significant events: 6/21>> evaluated in the ED for abdominal pain-found to have left mid ureteral stone-9 mm with hydronephrosis-and large volume of stool-managed with supportive care and discharged home. 6/22>> worsening abdominal pain-repeat CT scan consistent with bowel perforation-surgery consult-admit to [facility]. 6/29>> CT abdomen with pelvic abscess. 6/30>> CT-guided drain and pelvic abscess by IR 7/04>> vomiting-recurrent ileus-NG tube reinserted 7/05>> TNA started-repeat CT abdomen increased size of abscess. 7/07>> IR placed RLQ drain and right transgluteal pelvic drain. 7/10>> NGT removed  Significant studies: 6/22>> CT renal stone study: Interval development of intraperitoneal free air-moderate volume free fluid-consistent with bowel perforation, persistent large volume stool with dilated sigmoid colon.  Similar location of left ureteral stone-9 mm. 6/29>> CT abdomen/pelvis: S/p partial colectomy-LLQ ostomy-complex free fluid with multiple foci of air-most pronounced in the surgical bed-possibility of superimposed infection or bowel leak cannot be excluded. 6/30>> echo: EF 60-65%. 7/05>> CT abdomen/pelvis: Previous collection with pigtail has increased in size, other less well-defined but enlarging peritoneal fluid collections.  Persistent-partially obstructing 11 mm calculus  in the distal left ureter.  Persistent fluid loops of dilated small bowel-likely ileus.  Significant microbiology data: 6/22>> urine culture: No growth 6/22>> blood culture: No growth 6/30>> pelvic abscess culture: Gram-negative rods/gram-positive cocci-mixed anaerobic flora. 7/07>> Pelvic abscess culture:moderate proteus and enterococcus  Procedures: 6/22>> sigmoidectomy with end colostomy.  By general surgeon Dr. Polly 6/30>> CT-guided drain and pelvic abscess by IR 08/19/2023 1.  Cystoscopy with left ureteral stent placement. 2. left retrograde pyelogram with interpretation. 3. Fluoroscopy <1 hour with intraoperative interpretation by urologist Dr. Arlyss Foot  Consults: General Surgery IR Urology  Subjective: Patient in bed denies any headache chest or abdominal pain, no nausea or bloating,  Objective: Vitals: Blood pressure (!) 150/77, pulse 84, temperature (!) 97.2 F (36.2 C), temperature source Oral, resp. rate 18, height 5' 4 (1.626 m), weight 61.2 kg, SpO2 96%.   Exam:  Awake Alert, No new F.N deficits, Normal affect [place].AT,PERRAL Supple Neck, No JVD,   Symmetrical Chest wall movement, Good air movement bilaterally, CTAB RRR,No Gallops, Rubs or new Murmurs,  +ve B.Sounds, Abd Soft, No tenderness,   2 abdominal drains, colostomy bag, right arm PICC line, NG tube   Assessment/Plan:  Sepsis secondary to stercoral colitis with ischemia/perforation of sigmoid colon with feculent peritonitis-s/p sigmoid colectomy/ostomy on 6/22-complicated by postoperative ileus and then residual pelvic abscess requiring CT-guided drain placement by on 6/30. Ileus seems to be slowly improving-multiple output in ostomy bag overnight.  NG tube removed yesterday Remains n.p.o. for ureteral stent placement later today. Remains on Zosyn  General Surgery following-will await further recommendations. Continues to be on TNA.    Coffee-ground NG tube emesis Seems to have resolved-no longer  coffee-ground emesis-bile appearing fluid in canister. Continue PPI Hb stable, this problem has resolved continue to  monitor clinically  Hydronephrosis secondary to distal left ureteral stone Urology following-with recommendations for supportive care for now-now that her medical problems have more or less stabilized underwent left ureteral stent placement by urology Dr. Arlyss Foot on 08/18/2023  Hospital delirium Secondary to acute illness/narcotics Completely awake/alert this morning Maintain delirium precautions  Normocytic anemia Due to acute illness,+/- coffee-ground emesis on 7/7 Hb stable and better today. Follow.  Thrombocytosis Likely reactive Supportive care  Hypomagnesemia/hypophosphatemia/hypokalemia Secondary to NG tube suctioning Resolved.  HTN BP stable-all oral antihypertensives on hold due to n.p.o. status Continue with as needed IV hydralazine  and metoprolol .   Lower extremity edema Secondary to hypoalbuminemia/IV fluid resuscitation in the setting of acute illness Echo stable-UA negative for proteinuria Will start diuretics once she is a bit more stable.  Chronic idiopathic constipation Bowel function recovering-brown stools Once more stable-will need to be placed on a bowel regimen.  Odynophagia secondary to oral/aphthous ulcers Improved on Valtrex and Magic mouthwash Supportive care  Glaucoma Timolol  eyedrops  Insomnia Appears to be primarily due to pain-anxiety Schedule trazodone  at night If no response-use IV Benadryl   Debility/deconditioning PT OT eval-Home health recommended.  Code status:   Code Status: Full Code   DVT Prophylaxis: enoxaparin  (LOVENOX ) injection 40 mg Start: 08/17/23 1000 Place TED hose Start: 08/09/23 1000 SCDs Start: 07/30/23 1954 SCDs Start: 07/30/23 1953   Family Communication: None at bedside.   Disposition Plan: Status is: Inpatient Remains inpatient appropriate because: Severity of illness    Planned Discharge Destination:Home health   Diet: Diet Order             Diet clear liquid Room service appropriate? Yes; Fluid consistency: Thin  Diet effective now                    Data Review:   Inpatient Medications  Scheduled Meds:  acetaminophen   1,000 mg Oral Q6H   bisacodyl   10 mg Rectal Daily   Chlorhexidine  Gluconate Cloth  6 each Topical Daily   enoxaparin  (LOVENOX ) injection  40 mg Subcutaneous Q24H   feeding supplement  1 Container Oral TID BM   latanoprost   1 drop Both Eyes QHS   methocarbamol   500 mg Oral TID   Or   methocarbamol  (ROBAXIN ) injection  500 mg Intravenous TID   pantoprazole  (PROTONIX ) IV  40 mg Intravenous Q12H   sodium chloride  flush  5 mL Intracatheter Q8H   timolol   1 drop Both Eyes BID   traZODone   50 mg Oral QHS   Continuous Infusions:  ampicillin -sulbactam (UNASYN ) IV 3 g (08/19/23 0546)   TPN ADULT (ION) 70 mL/hr at 08/18/23 1757   TPN ADULT (ION)     PRN Meds:.albuterol , diphenhydrAMINE , hydrALAZINE , HYDROmorphone  (DILAUDID ) injection, melatonin, metoprolol  tartrate, ondansetron  **OR** ondansetron  (ZOFRAN ) IV, mouth rinse, oxyCODONE , phenol, sodium chloride  flush  DVT Prophylaxis  enoxaparin  (LOVENOX ) injection 40 mg Start: 08/17/23 1000 Place TED hose Start: 08/09/23 1000 SCDs Start: 07/30/23 1954 SCDs Start: 07/30/23 1953   Recent Labs  Lab 08/14/23 0130 08/15/23 0431 08/17/23 0443 08/18/23 0345 08/19/23 0352  WBC 5.9 5.7 8.7 9.5 8.8  HGB 7.2* 7.3* 8.0* 8.2* 7.6*  HCT 23.2* 23.2* 25.5* 25.8* 23.9*  PLT 639* 557* 513* 469* 416*  MCV 91.3 90.6 89.5 88.4 88.8  MCH 28.3 28.5 28.1 28.1 28.3  MCHC 31.0 31.5 31.4 31.8 31.8  RDW 15.5 15.7* 15.6* 15.8* 15.8*  LYMPHSABS  --   --   --   --  1.7  MONOABS  --   --   --   --  0.7  EOSABS  --   --   --   --  0.0  BASOSABS  --   --   --   --  0.1    Recent Labs  Lab 08/14/23 0130 08/15/23 0431 08/16/23 0350 08/17/23 0443 08/18/23 0345 08/19/23 0352  NA 137 137  135 133*  --  136  K 3.3* 4.0 4.3 4.2  --  3.9  CL 100 105 103 103  --  102  CO2 30 27 25 25   --  26  ANIONGAP 7 5 7 5   --  8  GLUCOSE 144* 124* 125* 104*  --  125*  BUN 17 14 15 18   --  18  CREATININE 0.41* 0.43* 0.58 0.74  --  0.55  AST 13*  --   --  17  --  15  ALT 11  --   --  9  --  10  ALKPHOS 54  --   --  60  --  65  BILITOT 0.3  --   --  0.5  --  0.3  ALBUMIN  <1.5*  --   --  <1.5*  --  1.6*  CRP  --   --   --   --   --  2.6*  PROCALCITON  --   --   --   --   --  <0.10  INR  --  1.1  --   --   --   --   MG 1.9 1.8 2.2 1.9 2.1 1.9  PHOS 2.0* 2.9 3.2 3.4  --  3.7  CALCIUM  7.5* 7.5* 7.8* 7.8*  --  8.1*      Recent Labs  Lab 08/14/23 0130 08/15/23 0431 08/16/23 0350 08/17/23 0443 08/18/23 0345 08/19/23 0352  CRP  --   --   --   --   --  2.6*  PROCALCITON  --   --   --   --   --  <0.10  INR  --  1.1  --   --   --   --   MG 1.9 1.8 2.2 1.9 2.1 1.9  CALCIUM  7.5* 7.5* 7.8* 7.8*  --  8.1*    --------------------------------------------------------------------------------------------------------------- Lab Results  Component Value Date   CHOL 183 11/30/2021   HDL 62.70 11/30/2021   LDLCALC 101 (H) 11/30/2021   LDLDIRECT 124.0 10/23/2020   TRIG 129 08/14/2023   CHOLHDL 3 11/30/2021    Lab Results  Component Value Date   HGBA1C 6.0 11/30/2021   No results for input(s): TSH, T4TOTAL, FREET4, T3FREE, THYROIDAB in the last 72 hours. No results for input(s): VITAMINB12, FOLATE, FERRITIN, TIBC, IRON, RETICCTPCT in the last 72 hours. ------------------------------------------------------------------------------------------------------------------ Cardiac Enzymes No results for input(s): CKMB, TROPONINI, MYOGLOBIN in the last 168 hours.  Invalid input(s): CK  Micro Results Recent Results (from the past 240 hours)  Aerobic/Anaerobic Culture w Gram Stain (surgical/deep wound)     Status: None   Collection Time: 08/14/23  6:13 PM    Specimen: Abscess  Result Value Ref Range Status   Specimen Description ABSCESS ABDOMEN  Final   Special Requests RLQ  Final   Gram Stain   Final    ABUNDANT WBC PRESENT, PREDOMINANTLY PMN ABUNDANT GRAM NEGATIVE RODS ABUNDANT GRAM POSITIVE COCCI IN PAIRS    Culture   Final    MODERATE PROTEUS MIRABILIS MODERATE ENTEROCOCCUS FAECALIS ABUNDANT BACTEROIDES THETAIOTAOMICRON BETA LACTAMASE POSITIVE Performed at [facility], [address] [place], [place] [address]  Report Status 08/18/2023 FINAL  Final   Organism ID, Bacteria PROTEUS MIRABILIS  Final   Organism ID, Bacteria ENTEROCOCCUS FAECALIS  Final      Susceptibility   Enterococcus faecalis - MIC*    AMPICILLIN  <=2 SENSITIVE Sensitive     VANCOMYCIN 1 SENSITIVE Sensitive     GENTAMICIN SYNERGY SENSITIVE Sensitive     * MODERATE ENTEROCOCCUS FAECALIS   Proteus mirabilis - MIC*    AMPICILLIN  <=2 SENSITIVE Sensitive     CEFEPIME  <=0.12 SENSITIVE Sensitive     CEFTAZIDIME <=1 SENSITIVE Sensitive     CEFTRIAXONE <=0.25 SENSITIVE Sensitive     CIPROFLOXACIN <=0.25 SENSITIVE Sensitive     GENTAMICIN <=1 SENSITIVE Sensitive     IMIPENEM >=16 RESISTANT Resistant     TRIMETH/SULFA <=20 SENSITIVE Sensitive     AMPICILLIN /SULBACTAM <=2 SENSITIVE Sensitive     PIP/TAZO <=4 SENSITIVE Sensitive ug/mL    * MODERATE PROTEUS MIRABILIS    Radiology Reports  DG C-Arm 1-60 Min Result Date: 08/18/2023 CLINICAL DATA:  Cystoscopy and retrograde pyelogram with ureteral stent insertion EXAM: DG C-ARM 1-60 MIN FLUOROSCOPY: Fluoroscopy Time:  34.7 Radiation Exposure Index (if provided by the fluoroscopic device): 4.91 mGy Number of Acquired Spot Images: 1 COMPARISON:  CT abdomen pelvis 08/12/2023 FINDINGS: Contrast within the dilated ureter and collecting system. Multiple dilated loops of small bowel. IMPRESSION: Contrast within the dilated ureter collecting system during retrograde pyelogram. Dilated loops of small bowel similar to  08/12/2023. Electronically Signed   By: Norman Gatlin M.D.   On: 08/18/2023 17:11      Signature  -   Lavada Stank M.D on 08/19/2023 at 11:00 AM   -  To page go to [email or web address]

## 2023-08-19 NOTE — Progress Notes (Signed)
 OT Cancellation Note  Patient Details Name: Angela [name] MRN: [id number] DOB: 1950-09-04   Cancelled Treatment:    Reason Eval/Treat Not Completed: Other (comment).  Attempted skilled OT treatment session.  Pt. Declines stating she is still sore from yesterday's procedure and has just fallen asleep for a nap.  Reviewed I will check back tomorrow.  Pt. Verbalized understanding.    CHRISTELLA Nest Lorraine-COTA/L 08/19/2023, 1:50 PM

## 2023-08-19 NOTE — Plan of Care (Signed)

## 2023-08-19 NOTE — Progress Notes (Signed)
 PHARMACY - TOTAL PARENTERAL NUTRITION CONSULT NOTE  Indication: intolerance to feeding, recurrent ileus  Patient Measurements: Height: 5' 4 (162.6 cm) Weight: 61.2 kg (135 lb) IBW/kg (Calculated) : 54.7 TPN AdjBW (KG): 61.2 Body mass index is 23.17 kg/m. Usual Weight: ~59 kg  Assessment:  72 YOF s/p exp lap with Hartmann's procedure for stercoral colitis with ischemia and perforation with feculent peritonitis on 6/22. Noted pt has been NPO/clear liquid/full liquid diet since after surgery with limited intake. Pt now with ileus on Xray (dilated loops of small bowel) and N/V with liquid diet so NGT replaced 7/4, now NPO and starting TPN. Pt at risk of refeeding with prolonged period of little po intake since admission.  Glucose / Insulin : no hx DM - CBGs < 130, off insulin , dexamethasone  10mg  x1 Electrolytes: K 3.9 (goal; >/= 4 for ileus), CoCa 10, Mg 1.9 (received 2g, goal >/= 2 for ileus), others wnl Renal: SCr 0.55, BUN WNL Hepatic: LFTs / TG / tbili WNL, albumin  1.6   Intake / Output; MIVF: UOP 0.6 ml/kg/hr, NGT , drain 97ml, colostomy 0mL charted but MD noted trickle of loose stools in bag  GI Imaging: 7/5 abd xray - persistent dilated loops of small bowel 7/5 CT - pelvic fluid collection slightly enlarged, suspicious for peritonitis, partially obstructing calculus in L ureter, ileus GI Surgeries / Procedures:  6/22 ex-lap with Hartmann's procedure for stercoral colitis with ischemia and perf with feculent peritonitis 7/8 CT-guided RLQ abd abscess and transgluteal pelvic drains [>453mL tan colored fluid] 7/10 NGT removed  7/11 NGT replaced   Central access: PICC placed 08/12/23 TPN start date: 08/12/23  Nutritional Goals: Goal TPN 70 ml/hr will provide 89g AA, 1660 kCal   RD Estimated Needs Total Energy Estimated Needs: 1650-1850 kcals Total Protein Estimated Needs: 80-90 g Total Fluid Estimated Needs: >/= 1.7 L  Current Nutrition:  TPN 7/10 CLD, Boost  7/11 NPO for  urology stent procedure 7/12 CLD  Plan:  Continue TPN at goal rate of 70 ml/hr to provide 100% of needs Electrolytes in TPN: Na 125 mEq/L, increase K 55 mEq/L, Ca 5 mEq/L, increase Mg 11 mEq/L, Phos 16 mmol/L, Cl:Ac 1:1 Add standard MVI and trace elements to TPN  Monitor TPN labs on Mon/Thurs and PRN F/u PO intake and wean TPN as able   Jinnie Door, PharmD, BCPS, BCCP Clinical Pharmacist  Please check AMION for all [facility] Pharmacy phone numbers After 10:00 PM, call Main Pharmacy [phone]

## 2023-08-19 NOTE — Plan of Care (Signed)

## 2023-08-19 NOTE — Progress Notes (Signed)
 1 Day Post-Op   Subjective/Chief Complaint: Patient with minimal nausea. Having a little output from stoma. NG replaced in OR yesterday due to quite a bit of SB dilation seen  NG: 550  Objective: Vital signs in last 24 hours: Temp:  [97.2 F (36.2 C)-98.1 F (36.7 C)] 97.2 F (36.2 C) (07/12 0803) Pulse Rate:  [69-84] 84 (07/12 0803) Resp:  [17-25] 18 (07/12 0803) BP: (113-150)/(58-77) 150/77 (07/12 0803) SpO2:  [92 %-97 %] 96 % (07/12 0803) Weight:  [61.2 kg] 61.2 kg (07/11 1123) Last BM Date : 08/16/23  Intake/Output from previous day: 07/11 0701 - 07/12 0700 In: 110 [I.V.:10; IV Piggyback:100] Out: 2422 [Urine:875; Emesis/NG output:550; Drains:97] Intake/Output this shift: No intake/output data recorded.  Lab Results:  Gen: NAD Lungs: effort nonlabored  Abd: soft, colostomy viable and with a little gas and liquid stool present.  Moderate distention. Midline dressing c/d/i . IR drains with purulent fluid.   BMET Recent Labs    08/17/23 0443 08/19/23 0352  NA 133* 136  K 4.2 3.9  CL 103 102  CO2 25 26  GLUCOSE 104* 125*  BUN 18 18  CREATININE 0.74 0.55  CALCIUM  7.8* 8.1*   PT/INR No results for input(s): LABPROT, INR in the last 72 hours.  ABG No results for input(s): PHART, HCO3 in the last 72 hours.  Invalid input(s): PCO2, PO2  Studies/Results: DG C-Arm 1-60 Min Result Date: 08/18/2023 CLINICAL DATA:  Cystoscopy and retrograde pyelogram with ureteral stent insertion EXAM: DG C-ARM 1-60 MIN FLUOROSCOPY: Fluoroscopy Time:  34.7 Radiation Exposure Index (if provided by the fluoroscopic device): 4.91 mGy Number of Acquired Spot Images: 1 COMPARISON:  CT abdomen pelvis 08/12/2023 FINDINGS: Contrast within the dilated ureter and collecting system. Multiple dilated loops of small bowel. IMPRESSION: Contrast within the dilated ureter collecting system during retrograde pyelogram. Dilated loops of small bowel similar to 08/12/2023. Electronically Signed    By: Norman Gatlin M.D.   On: 08/18/2023 17:11     Anti-infectives: Anti-infectives (From admission, onward)    Start     Dose/Rate Route Frequency Ordered Stop   08/18/23 1800  Ampicillin -Sulbactam (UNASYN ) 3 g in sodium chloride  0.9 % 100 mL IVPB        3 g 200 mL/hr over 30 Minutes Intravenous Every 6 hours 08/18/23 0922     08/06/23 1130  acyclovir  (ZOVIRAX ) 200 MG capsule 400 mg  Status:  Discontinued        400 mg Oral 3 times daily 08/06/23 1037 08/08/23 1424   08/06/23 1100  piperacillin -tazobactam (ZOSYN ) IVPB 3.375 g        3.375 g 12.5 mL/hr over 240 Minutes Intravenous Every 8 hours 08/06/23 1051 08/18/23 1259   07/30/23 1800  piperacillin -tazobactam (ZOSYN ) IVPB 3.375 g        3.375 g 12.5 mL/hr over 240 Minutes Intravenous Every 8 hours 07/30/23 1332 08/04/23 2359   07/30/23 1400  piperacillin -tazobactam (ZOSYN ) IVPB 3.375 g  Status:  Discontinued        3.375 g 100 mL/hr over 30 Minutes Intravenous Every 8 hours 07/30/23 1329 07/30/23 1332   07/30/23 1015  piperacillin -tazobactam (ZOSYN ) IVPB 3.375 g        3.375 g 100 mL/hr over 30 Minutes Intravenous  Once 07/30/23 1013 07/30/23 1311       Assessment/Plan: Procedure(s): LAPAROTOMY, EXPLORATORY (N/A) COLECTOMY, SIGMOID, OPEN s/p Procedure(s): LAPAROTOMY, EXPLORATORY (N/A) COLECTOMY, SIGMOID, OPEN POD 20, s/p ex lap with Hartmann's procedure for stercoral colitis with ischemia and perforation  with feculent peritonitis, Dr. Polly 6/22  - NG to LWS today - continue stomal suppositories  - CT scan (7/5) shows collection remains sizable and has slightly enlarged compared with the previous study. This abuts the rectal suture line and a leak in this area cannot be excluded. - IR repositioned drains, both purulent. Cx with proteus mirabilis and enterococcus both sensitive to zosyn . Consider repeat scan when drain output decreases, early next week - Continue TNA - conitnue to mobilize as able. - BID dressing  changes to midline wound  - WOC following - pulm toilet  - BID PPI  FEN - NPO, TPN VTE - lovenox   ID - zosyn  6/29>>   - per [facility] -  Nephrolithiasis - per urology,  Chronic constipation - bowel regimen HTN Glaucoma  LOS: 20 days   Discussed plan of care with patient and family at bedside  Bernarda JAYSON Ned, MD  Colorectal and General Surgery [facility] Surgery

## 2023-08-20 DIAGNOSIS — K631 Perforation of intestine (nontraumatic): Secondary | ICD-10-CM | POA: Diagnosis not present

## 2023-08-20 LAB — CBC WITH DIFFERENTIAL/PLATELET
Abs Immature Granulocytes: 1.14 K/uL — ABNORMAL HIGH (ref 0.00–0.07)
Basophils Absolute: 0.1 K/uL (ref 0.0–0.1)
Basophils Relative: 1 %
Eosinophils Absolute: 0.3 K/uL (ref 0.0–0.5)
Eosinophils Relative: 3 %
HCT: 24 % — ABNORMAL LOW (ref 36.0–46.0)
Hemoglobin: 7.6 g/dL — ABNORMAL LOW (ref 12.0–15.0)
Immature Granulocytes: 11 %
Lymphocytes Relative: 23 %
Lymphs Abs: 2.4 K/uL (ref 0.7–4.0)
MCH: 28.3 pg (ref 26.0–34.0)
MCHC: 31.7 g/dL (ref 30.0–36.0)
MCV: 89.2 fL (ref 80.0–100.0)
Monocytes Absolute: 1 K/uL (ref 0.1–1.0)
Monocytes Relative: 10 %
Neutro Abs: 5.6 K/uL (ref 1.7–7.7)
Neutrophils Relative %: 52 %
Platelets: 435 K/uL — ABNORMAL HIGH (ref 150–400)
RBC: 2.69 MIL/uL — ABNORMAL LOW (ref 3.87–5.11)
RDW: 16.2 % — ABNORMAL HIGH (ref 11.5–15.5)
Smear Review: NORMAL
WBC: 10.6 K/uL — ABNORMAL HIGH (ref 4.0–10.5)
nRBC: 0 % (ref 0.0–0.2)

## 2023-08-20 LAB — COMPREHENSIVE METABOLIC PANEL WITH GFR
ALT: 14 U/L (ref 0–44)
AST: 17 U/L (ref 15–41)
Albumin: 1.7 g/dL — ABNORMAL LOW (ref 3.5–5.0)
Alkaline Phosphatase: 73 U/L (ref 38–126)
Anion gap: 7 (ref 5–15)
BUN: 21 mg/dL (ref 8–23)
CO2: 28 mmol/L (ref 22–32)
Calcium: 8.4 mg/dL — ABNORMAL LOW (ref 8.9–10.3)
Chloride: 103 mmol/L (ref 98–111)
Creatinine, Ser: 0.5 mg/dL (ref 0.44–1.00)
GFR, Estimated: >60 mL/min (ref >60)
Glucose, Bld: 109 mg/dL — ABNORMAL HIGH (ref 70–99)
Potassium: 3.8 mmol/L (ref 3.5–5.1)
Sodium: 138 mmol/L (ref 135–145)
Total Bilirubin: 0.4 mg/dL (ref 0.0–1.2)
Total Protein: 5.1 g/dL — ABNORMAL LOW (ref 6.5–8.1)

## 2023-08-20 LAB — GLUCOSE, CAPILLARY
Glucose-Capillary: 105 mg/dL — ABNORMAL HIGH (ref 70–99)
Glucose-Capillary: 108 mg/dL — ABNORMAL HIGH (ref 70–99)

## 2023-08-20 LAB — PHOSPHORUS: Phosphorus: 4 mg/dL (ref 2.5–4.6)

## 2023-08-20 LAB — MAGNESIUM: Magnesium: 1.8 mg/dL (ref 1.7–2.4)

## 2023-08-20 LAB — C-REACTIVE PROTEIN: CRP: 1.3 mg/dL — ABNORMAL HIGH (ref <1.0)

## 2023-08-20 LAB — PROCALCITONIN: Procalcitonin: <0.1 ng/mL

## 2023-08-20 MED ORDER — POTASSIUM CHLORIDE 20 MEQ PO PACK
40.0000 meq | PACK | Freq: Once | ORAL | Status: AC
  Administered 2023-08-20: 40 meq via ORAL
  Filled 2023-08-20: qty 2

## 2023-08-20 MED ORDER — TRAVASOL 10 % IV SOLN
INTRAVENOUS | Status: AC
  Filled 2023-08-20: qty 890.4

## 2023-08-20 MED ORDER — ADULT MULTIVITAMIN W/MINERALS CH
1.0000 | ORAL_TABLET | Freq: Every day | ORAL | Status: DC
  Administered 2023-08-21 – 2023-08-22 (×2): 1 via ORAL
  Filled 2023-08-20 (×3): qty 1

## 2023-08-20 MED ORDER — POTASSIUM CHLORIDE CRYS ER 20 MEQ PO TBCR: 40.0000 meq | EXTENDED_RELEASE_TABLET | Freq: Once | ORAL | Status: DC

## 2023-08-20 MED ORDER — FERROUS SULFATE 325 (65 FE) MG PO TABS
325.0000 mg | ORAL_TABLET | Freq: Two times a day (BID) | ORAL | Status: DC
  Administered 2023-08-20 – 2023-09-04 (×24): 325 mg via ORAL
  Filled 2023-08-20 (×26): qty 1

## 2023-08-20 MED ORDER — POTASSIUM CHLORIDE CRYS ER 20 MEQ PO TBCR
40.0000 meq | EXTENDED_RELEASE_TABLET | Freq: Once | ORAL | Status: AC
  Filled 2023-08-20: qty 2

## 2023-08-20 MED ORDER — FOLIC ACID 1 MG PO TABS
1.0000 mg | ORAL_TABLET | Freq: Every day | ORAL | Status: DC
  Administered 2023-08-20 – 2023-09-04 (×12): 1 mg via ORAL
  Filled 2023-08-20 (×14): qty 1

## 2023-08-20 MED ORDER — MAGNESIUM SULFATE 2 GM/50ML IV SOLN
2.0000 g | Freq: Once | INTRAVENOUS | Status: AC
  Administered 2023-08-20: 2 g via INTRAVENOUS
  Filled 2023-08-20: qty 50

## 2023-08-20 NOTE — Plan of Care (Signed)

## 2023-08-20 NOTE — Progress Notes (Signed)
 Occupational Therapy Treatment Patient Details Name: Angela [name] MRN: [id number] DOB: February 16, 1950 Today's Date: 08/20/2023   History of present illness Angela [name] is a 73 y.o. female who presents with abdominal pain and bloating  Renal CT revealed interval development of intraperitoneal free air and moderate volume free fluid containing layering hyperdensities consistent with bowel perforation, distal left ureteral stone measuring 9 mm with interval decrease left hydroureteronephrosis. . Past medical history significant of hypertension, hyperlipidemia, glaucoma and prior tobacco abuse   OT comments  Pt. Seen for skilled OT treatment session.  Pt. Able to complete bed mobility MOD I with hob elevated and use of rails.  In room ambulation to/from b.room and standing grooming tasks with S.  Reports feeling better overall but still not getting enough sleep/rest.  Cont. With acute OT POC.        If plan is discharge home, recommend the following:  A little help with bathing/dressing/bathroom;Assistance with cooking/housework   Equipment Recommendations  Tub/shower seat    Recommendations for Other Services      Precautions / Restrictions Precautions Precautions: Fall;Other (comment) Precaution/Restrictions Comments: Multiple JP drains now along with ostomy bag. NG tube has been pulled       Mobility Bed Mobility Overal bed mobility: Modified Independent Bed Mobility: Supine to Sit     Supine to sit: Supervision          Transfers Overall transfer level: Needs assistance Equipment used: Rolling walker (2 wheels) Transfers: Sit to/from Stand, Bed to chair/wheelchair/BSC Sit to Stand: Supervision     Step pivot transfers: Supervision     General transfer comment: Cues for hand placement     Balance                                           ADL either performed or assessed with clinical judgement   ADL Overall ADL's : Needs assistance/impaired      Grooming: Standing;Supervision/safety;Wash/dry Media planner (2 wheels);Ambulation;Comfort height toilet;Supervision/safety   Toileting- Clothing Manipulation and Hygiene: Sitting/lateral lean;Set up       Functional mobility during ADLs: Supervision/safety;Rolling walker (2 wheels)      Extremity/Trunk Assessment              Vision       Perception     Praxis     Communication Communication Communication: No apparent difficulties   Cognition Arousal: Alert Behavior During Therapy: WFL for tasks assessed/performed Cognition: No apparent impairments                               Following commands: Intact        Cueing   Cueing Techniques: Verbal cues  Exercises      Shoulder Instructions       General Comments      Pertinent Vitals/ Pain       Pain Assessment Pain Assessment: No/denies pain  Home Living                                          Prior Functioning/Environment  Frequency  Min 2X/week        Progress Toward Goals  OT Goals(current goals can now be found in the care plan section)  Progress towards OT goals: Progressing toward goals     Plan      Co-evaluation                 AM-PAC OT 6 Clicks Daily Activity     Outcome Measure   Help from another person eating meals?: None Help from another person taking care of personal grooming?: A Little Help from another person toileting, which includes using toliet, bedpan, or urinal?: A Little Help from another person bathing (including washing, rinsing, drying)?: A Little Help from another person to put on and taking off regular upper body clothing?: A Little Help from another person to put on and taking off regular lower body clothing?: A Little 6 Click Score: 19    End of Session Equipment Utilized During Treatment: Rolling walker (2 wheels);Gait belt  OT Visit  Diagnosis: Unsteadiness on feet (R26.81);Other abnormalities of gait and mobility (R26.89);Pain;Muscle weakness (generalized) (M62.81)   Activity Tolerance Patient tolerated treatment well   Patient Left with call bell/phone within reach;with family/visitor present;in chair;Other (comment) (MD present at end of session meeting with pt. and dtr.)   Nurse Communication Other (comment) (rn present at beg. of session, states ok to place pt on standby and disconnect additional leads during ambulation)        Time: 9042-8981 OT Time Calculation (min): 21 min  Charges: OT General Charges $OT Visit: 1 Visit OT Treatments $Self Care/Home Management : 8-22 mins  Randall, COTA/L Acute Rehabilitation [phone]   CHRISTELLA Nest Lorraine-COTA/L  08/20/2023, 11:41 AM

## 2023-08-20 NOTE — Progress Notes (Incomplete)
 CC: Follow up diabetes  HPI:  [name] is a 73 y.o. female living with a history stated below and presents today for follow up of his diabetes. Please see problem based assessment and plan for additional details.  Past Medical History:  Diagnosis Date   ESRD on hemodialysis (HCC)    Gangrene (HCC)    right foot   GERD (gastroesophageal reflux disease)    HFrEF (heart failure with reduced ejection fraction) (HCC)    Hyperlipidemia    Hypertension    Neuromuscular disorder (HCC)    diabetic neruopathy - hands   Osteomyelitis (HCC) 2010   left foot, s/p midfoot amputation   Osteomyelitis (HCC) 10/2013   RT BKA   Osteomyelitis of ankle or foot 06/2011   rt foot, s/p 5th ray amputation   PAD (peripheral artery disease) (HCC)    Pneumonia 2010   Retroperitoneal hematoma    06/11/2021 renal biopsy complicated by retroperitoneal hematoma. 5/9 - 06/23/2021 H/H 6.9/20.9.  Imaging revealed large retroperitoneal hematoma.  S/P 2 units packed cells H/H stabilized with final values of 11.3/36.4.   S/P transmetatarsal amputation of foot, left (HCC) 12/27/2019   SOB (shortness of breath)    uses inhaler prn   Type II diabetes mellitus (HCC) ~ 2002    Current Outpatient Medications on File Prior to Visit  Medication Sig Dispense Refill   amLODipine (NORVASC) 10 MG tablet Take 1 tablet (10 mg total) by mouth daily. (Patient not taking: Reported on 09/13/2021) 90 tablet 2   aspirin EC 81 MG tablet Take 1 tablet (81 mg total) by mouth daily. Swallow whole. (Patient not taking: Reported on 08/05/2021) 30 tablet 0   blood glucose meter kit and supplies KIT Dispense based on patient and insurance preference. Use up to four times daily as directed. (FOR ICD-9 250.00, 250.01). 1 each 0   Blood Glucose Monitoring Suppl (BLOOD GLUCOSE MONITOR SYSTEM) w/Device KIT Use to check blood sugar 3 (three) times daily. 1 kit 0   carvedilol (COREG) 6.25 MG tablet Take 1 tablet (6.25 mg total) by mouth 2 (two)  times daily with meals for hypertension 60 tablet 0   Continuous Blood Gluc Sensor (FREESTYLE LIBRE 2 SENSOR) MISC Place 1 sensor on the skin every 14 days. Use to check glucose continuously 2 each 3   Continuous Blood Gluc Sensor (FREESTYLE LIBRE 2 SENSOR) MISC Use as directed every 14 days 2 each 0   Darbepoetin Alfa (ARANESP) 100 MCG/0.5ML SOSY injection Inject 0.5 mLs (100 mcg total) into the vein every Wednesday with hemodialysis. 4.2 mL    DULoxetine (CYMBALTA) 30 MG capsule Take 2 capsules (60 mg total) by mouth daily. 180 capsule 1   ezetimibe (ZETIA) 10 MG tablet Take 1 tablet (10 mg total) by mouth daily. 30 tablet 0   fluticasone (FLONASE) 50 MCG/ACT nasal spray Place 1 spray into both nostrils daily. (Patient not taking: Reported on 09/13/2021) 16 g 0   glucose blood (ACCU-CHEK AVIVA PLUS) test strip Check blood sugar 3 times a day 100 each 12   Insulin Pen Needle 32G X 4 MM MISC Use to inject insulin 4 (four) times daily. 360 each 3   isosorbide mononitrate (IMDUR) 30 MG 24 hr tablet Take 0.5 tablets (15 mg total) by mouth daily for ischemic cardiomyopathy 15 tablet 0   melatonin (KP MELATONIN) 3 MG TABS tablet Take 1 tablet (3 mg total) by mouth at bedtime for insomnia. 30 tablet 0   oxyCODONE (OXY IR/ROXICODONE) 5 MG   immediate release tablet Take 1 tablet (5 mg total) by mouth every 6 (six) hours as needed for pain 120 tablet 0   pantoprazole (PROTONIX) 40 MG tablet Take 1 tablet by mouth once a day 30 tablet 11   polyethylene glycol powder (GLYCOLAX/MIRALAX) 17 GM/SCOOP powder Take 17 g by mouth daily. 510 g 0   rosuvastatin (CRESTOR) 10 MG tablet Take 1 tablet (10 mg total) by mouth daily. 90 tablet 2   senna-docusate (SENEXON-S) 8.6-50 MG tablet Take 1 tablet by mouth at bedtime for constipation 30 tablet 0   No current facility-administered medications on file prior to visit.    Family History  Problem Relation Age of Onset   Diabetes Mother    Hypertension Brother     Hypertension Sister    Anesthesia problems Neg Hx    Colon cancer Neg Hx    Rectal cancer Neg Hx    Stomach cancer Neg Hx     Social History   Socioeconomic History   Marital status: Single    Spouse name: Not on file   Number of children: 3   Years of education: 11th   Highest education level: Not on file  Occupational History    Employer: [employer]  Tobacco Use   Smoking status: Every Day    Years: 24.00    Types: Cigarettes    Passive exposure: Never   Smokeless tobacco: Never   Tobacco comments:    STARTED BACK SMOKING 2017. 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, makes chair parts)   Graduated from [employer]; No further school because he had a baby girl   He has 4 children  (17, 22 , 27, 30 as of 2013)   Social Determinants of Health   Financial Resource Strain: Not on file  Food Insecurity: No Food Insecurity (10/25/2021)   Hunger Vital Sign    Worried About Running Out of Food in the Last Year: Never true    Ran Out of Food in the Last Year: Never true  Transportation Needs: No Transportation Needs (10/25/2021)   PRAPARE - Transportation    Lack of Transportation (Medical): No    Lack of Transportation (Non-Medical): No  Physical Activity: Not on file  Stress: Not on file  Social Connections: Not on file  Intimate Partner Violence: Not on file    Review of Systems: ROS negative except for what is noted on the assessment and plan.  Vitals:   04/06/22 1458  BP: (!) 164/89  Pulse: 88  Temp: 98.1 F (36.7 C)  TempSrc: Oral  Weight: 157 lb 9.6 oz (71.5 kg)  Height: 5' 8" (1.727 m)     Physical Exam: Constitutional: no acute distress HENT: normocephalic atraumatic Eyes: conjunctiva non-erythematous Neck: supple Cardiovascular: regular rate and rhythm, no m/r/g Pulmonary/Chest: normal work of breathing on room air, lungs clear to auscultation bilaterally MSK: normal bulk and tone. Right AKA and left BKA no wounds or sores on residual limbs Neurological: alert & oriented x 3 Skin: warm and dry. Right IJ central line intact.   No erythema or soreness. Area is not covered with dressing Psych: flat affect  Assessment & Plan:   Hypertension, essential Assessment: Current regimen of amlodipine 10 mg and carvedilol 6.125 mg BID. He denies taking his blood pressure medications today. Nephrology team to continue managing his BP. Encouraged him to follow up with them.   Plan: - continue amlodipine 10 mg and carvedilol 6.125 mg BID - follow up further management per nephrology  Type 2 diabetes mellitus with diabetic peripheral angiopathy and gangrene, with long-term current use of insulin (HCC) Assessment: Current regimen of lantus 15 U QHS and novolog 5 U TID with meals. He infrequently checks his glucose levels and has missed a few of his insulin doses. A1c today 8.7% from 8.2%. We discussed importance of adhering to insulin regimen and checking glucose levels regularly. He endorses difficulties remember to take his insulin. If continues to have increase in A1c and poor adherence to monitoring his insulin could decrease insulin and add weekly GLP-1i.   Plan: - continue lantus 15 U QHS and novolog 5 U TID - encourage adherence to regimen and checking glucose levels regularly - needs eye exam, ophthalmology referral placed  PVD (peripheral vascular disease) Assessment: S/p left AKA and right BKA. He continues to have numbness/tingling in his fingers secondary to PAD. Best thing he can do is stop smoking which we discussed extensively today. He gets his left prosthetic  Friday which I believe will help with his ADL/iADL  Plan: - continue aspirin and rosuvastatin daily  ESRD on dialysis (HCC) Assessment: Hx of diabetic nephropathy. Misses some dialysis sessions due to difficulty with transportation. S/p failed fistula. His tunneled right IJ dialysis catheter was completely exposed today and not sutured in. He cannot remember the last time it was covered. Discussed bringing this up with renal navigator and asking if they are able to clean and cover the area at dialysis tomorrow.   Plan: - continue to work on dialysis adherence - follow up to make certain dialysis catheter is always covered and cleaned regularly. Hopeful he qualifies for medicaid and PCS  HLD (hyperlipidemia) Assessment: LDL at goal for secondary prevention  Plan: - continue rosuvastatin 10 mg daily  Tobacco use disorder Assessment: Extensive tobacco use disorder history. Discussed stopping today, he is interested in chantix as well as nicotine patches and gum. He has not had lung cancer CT screening. Last CT chest was in 2019 and showed pulmonary nodules. Does not appear these were ever followed up on.   Plan: - chantix (renally dosed .5 mg daily), nicotine patch and gum PRN - low con chest CT to follow up nodules  Pulmonary nodules Assessment: 2019 CT scan found to have mediastinal adenopathy. Reviewed by tumor board and PET scan performed. He was felt to be low risk for lung cancer. This does not see to have been followed since then. Will order low dose chest CT for lung cancer screening  Plan: - low dose CT chest  Patient discussed with Dr. Guilloud  Vasili Katsadouros, D.O. [facility], PGY-3 Phone: [phone] Date 04/07/2022 Time 10:29 AM  

## 2023-08-20 NOTE — Progress Notes (Signed)
 PROGRESS NOTE        PATIENT DETAILS Name: Angela [name] Age: 73 y.o. Sex: female Date of Birth: 1951/01/06 Admit Date: 07/30/2023 Admitting Physician Maximino DELENA Sharps, MD ERE:[id number], [name], DO  Brief Summary: Patient is a 73 y.o.  female with history of HTN, HLD who presented with worsening abdominal pain-patient was found to have sigmoid perforation with feculent peritonitis in the setting of stercoral colitis.  Patient underwent sigmoidectomy with end colostomy on 6/22-unfortunately postop course complicated by ileus and pelvic residual abscesses requiring IR drain placement.    Significant events: 6/21>> evaluated in the ED for abdominal pain-found to have left mid ureteral stone-9 mm with hydronephrosis-and large volume of stool-managed with supportive care and discharged home. 6/22>> worsening abdominal pain-repeat CT scan consistent with bowel perforation-surgery consult-admit to [facility]. 6/29>> CT abdomen with pelvic abscess. 6/30>> CT-guided drain and pelvic abscess by IR 7/04>> vomiting-recurrent ileus-NG tube reinserted 7/05>> TNA started-repeat CT abdomen increased size of abscess. 7/07>> IR placed RLQ drain and right transgluteal pelvic drain. 7/10>> NGT removed  Significant studies: 6/22>> CT renal stone study: Interval development of intraperitoneal free air-moderate volume free fluid-consistent with bowel perforation, persistent large volume stool with dilated sigmoid colon.  Similar location of left ureteral stone-9 mm. 6/29>> CT abdomen/pelvis: S/p partial colectomy-LLQ ostomy-complex free fluid with multiple foci of air-most pronounced in the surgical bed-possibility of superimposed infection or bowel leak cannot be excluded. 6/30>> echo: EF 60-65%. 7/05>> CT abdomen/pelvis: Previous collection with pigtail has increased in size, other less well-defined but enlarging peritoneal fluid collections.  Persistent-partially obstructing 11 mm calculus  in the distal left ureter.  Persistent fluid loops of dilated small bowel-likely ileus.  Significant microbiology data: 6/22>> urine culture: No growth 6/22>> blood culture: No growth 6/30>> pelvic abscess culture: Gram-negative rods/gram-positive cocci-mixed anaerobic flora. 7/07>> Pelvic abscess culture:moderate proteus and enterococcus  Procedures: 6/22>> sigmoidectomy with end colostomy.  By general surgeon Dr. Polly 6/30>> CT-guided drain and pelvic abscess by IR 08/19/2023 1.  Cystoscopy with left ureteral stent placement. 2. left retrograde pyelogram with interpretation. 3. Fluoroscopy <1 hour with intraoperative interpretation by urologist Dr. Arlyss Foot  Consults: General Surgery IR Urology  Subjective:  Patient in bed, appears comfortable, denies any headache, no fever, no chest pain or pressure, no shortness of breath , no abdominal pain. No new focal weakness.   Objective: Vitals: Blood pressure (!) 159/70, pulse 66, temperature 97.8 F (36.6 C), temperature source Oral, resp. rate 19, height 5' 4 (1.626 m), weight 64.4 kg, SpO2 96%.   Exam:  Awake Alert, No new F.N deficits, Normal affect [place].AT,PERRAL Supple Neck, No JVD,   Symmetrical Chest wall movement, Good air movement bilaterally, CTAB RRR,No Gallops, Rubs or new Murmurs,  +ve B.Sounds, Abd Soft, No tenderness,   2 abdominal drains, colostomy bag with brown stool, right arm PICC line, NG tube   Assessment/Plan:  Sepsis secondary to stercoral colitis with ischemia/perforation of sigmoid colon with feculent peritonitis-s/p sigmoid colectomy/ostomy on 6/22-complicated by postoperative ileus and then residual pelvic abscess requiring CT-guided drain placement by on 6/30. Ileus seems to be slowly improving-multiple output in ostomy bag overnight.  NG tube removed yesterday Remains n.p.o. for ureteral stent placement later today. Remains on Zosyn  General Surgery following-will await further  recommendations. Continues to be on TNA, clear liquid diet started 08/19/2023.    Coffee-ground NG tube  emesis Seems to have resolved-no longer coffee-ground emesis-bile appearing fluid in canister. Continue PPI Hb stable, this problem has resolved continue to monitor clinically  Hydronephrosis secondary to distal left ureteral stone Urology following-with recommendations for supportive care for now-now that her medical problems have more or less stabilized underwent left ureteral stent placement by urology Dr. Arlyss Foot on 08/18/2023  Hospital delirium Secondary to acute illness/narcotics Completely awake/alert this morning Maintain delirium precautions  Normocytic anemia Due to acute illness,+/- coffee-ground emesis on 7/7 Hemoglobin stable although low, type screen done will monitor, will likely require 1 to 2 units of packed RBCs soon. Thrombocytosis Likely reactive Supportive care  Hypomagnesemia/hypophosphatemia/hypokalemia Secondary to NG tube suctioning Resolved.  HTN BP stable-all oral antihypertensives on hold due to n.p.o. status Continue with as needed IV hydralazine  and metoprolol .   Lower extremity edema Secondary to hypoalbuminemia/IV fluid resuscitation in the setting of acute illness Echo stable-UA negative for proteinuria Will start diuretics once she is a bit more stable.  Chronic idiopathic constipation Bowel function recovering-brown stools Once more stable-will need to be placed on a bowel regimen.  Odynophagia secondary to oral/aphthous ulcers Improved on Valtrex and Magic mouthwash Supportive care  Glaucoma Timolol  eyedrops  Insomnia Appears to be primarily due to pain-anxiety Schedule trazodone  at night If no response-use IV Benadryl   Debility/deconditioning PT OT eval-Home health recommended.  Code status:   Code Status: Full Code   DVT Prophylaxis: enoxaparin  (LOVENOX ) injection 40 mg Start: 08/17/23 1000 Place TED hose Start:  08/09/23 1000 SCDs Start: 07/30/23 1954 SCDs Start: 07/30/23 1953   Family Communication: None at bedside.   Disposition Plan: Status is: Inpatient Remains inpatient appropriate because: Severity of illness   Planned Discharge Destination:Home health   Diet: Diet Order             Diet clear liquid Room service appropriate? Yes; Fluid consistency: Thin  Diet effective now                    Data Review:   Inpatient Medications  Scheduled Meds:  acetaminophen   1,000 mg Oral Q6H   bisacodyl   10 mg Rectal Daily   Chlorhexidine  Gluconate Cloth  6 each Topical Daily   enoxaparin  (LOVENOX ) injection  40 mg Subcutaneous Q24H   feeding supplement  1 Container Oral TID BM   ferrous sulfate   325 mg Oral BID WC   folic acid   1 mg Oral Daily   latanoprost   1 drop Both Eyes QHS   methocarbamol   500 mg Oral TID   Or   methocarbamol  (ROBAXIN ) injection  500 mg Intravenous TID   [START ON 08/21/2023] multivitamin with minerals  1 tablet Oral Daily   pantoprazole  (PROTONIX ) IV  40 mg Intravenous Q12H   sodium chloride  flush  5 mL Intracatheter Q8H   timolol   1 drop Both Eyes BID   traZODone   50 mg Oral QHS   Continuous Infusions:  ampicillin -sulbactam (UNASYN ) IV 3 g (08/20/23 0543)   magnesium  sulfate bolus IVPB 2 g (08/20/23 0950)   TPN ADULT (ION) 70 mL/hr at 08/19/23 1752   TPN ADULT (ION)     PRN Meds:.albuterol , diphenhydrAMINE , hydrALAZINE , HYDROmorphone  (DILAUDID ) injection, melatonin, metoprolol  tartrate, ondansetron  **OR** ondansetron  (ZOFRAN ) IV, mouth rinse, oxyCODONE , phenol, sodium chloride  flush  DVT Prophylaxis  enoxaparin  (LOVENOX ) injection 40 mg Start: 08/17/23 1000 Place TED hose Start: 08/09/23 1000 SCDs Start: 07/30/23 1954 SCDs Start: 07/30/23 1953   Recent Labs  Lab 08/15/23 0431 08/17/23 0443 08/18/23 0345 08/19/23  0352 08/20/23 0349  WBC 5.7 8.7 9.5 8.8 10.6*  HGB 7.3* 8.0* 8.2* 7.6* 7.6*  HCT 23.2* 25.5* 25.8* 23.9* 24.0*  PLT 557*  513* 469* 416* 435*  MCV 90.6 89.5 88.4 88.8 89.2  MCH 28.5 28.1 28.1 28.3 28.3  MCHC 31.5 31.4 31.8 31.8 31.7  RDW 15.7* 15.6* 15.8* 15.8* 16.2*  LYMPHSABS  --   --   --  1.7 2.4  MONOABS  --   --   --  0.7 1.0  EOSABS  --   --   --  0.0 0.3  BASOSABS  --   --   --  0.1 0.1    Recent Labs  Lab 08/14/23 0130 08/15/23 0431 08/16/23 0350 08/17/23 0443 08/18/23 0345 08/19/23 0352 08/20/23 0349  NA 137 137 135 133*  --  136 138  K 3.3* 4.0 4.3 4.2  --  3.9 3.8  CL 100 105 103 103  --  102 103  CO2 30 27 25 25   --  26 28  ANIONGAP 7 5 7 5   --  8 7  GLUCOSE 144* 124* 125* 104*  --  125* 109*  BUN 17 14 15 18   --  18 21  CREATININE 0.41* 0.43* 0.58 0.74  --  0.55 0.50  AST 13*  --   --  17  --  15 17  ALT 11  --   --  9  --  10 14  ALKPHOS 54  --   --  60  --  65 73  BILITOT 0.3  --   --  0.5  --  0.3 0.4  ALBUMIN  <1.5*  --   --  <1.5*  --  1.6* 1.7*  CRP  --   --   --   --   --  2.6* 1.3*  PROCALCITON  --   --   --   --   --  <0.10 <0.10  INR  --  1.1  --   --   --   --   --   MG 1.9 1.8 2.2 1.9 2.1 1.9 1.8  PHOS 2.0* 2.9 3.2 3.4  --  3.7 4.0  CALCIUM  7.5* 7.5* 7.8* 7.8*  --  8.1* 8.4*      Recent Labs  Lab 08/15/23 0431 08/16/23 0350 08/17/23 0443 08/18/23 0345 08/19/23 0352 08/20/23 0349  CRP  --   --   --   --  2.6* 1.3*  PROCALCITON  --   --   --   --  <0.10 <0.10  INR 1.1  --   --   --   --   --   MG 1.8 2.2 1.9 2.1 1.9 1.8  CALCIUM  7.5* 7.8* 7.8*  --  8.1* 8.4*    --------------------------------------------------------------------------------------------------------------- Lab Results  Component Value Date   CHOL 183 11/30/2021   HDL 62.70 11/30/2021   LDLCALC 101 (H) 11/30/2021   LDLDIRECT 124.0 10/23/2020   TRIG 129 08/14/2023   CHOLHDL 3 11/30/2021    Lab Results  Component Value Date   HGBA1C 6.0 11/30/2021   No results for input(s): TSH, T4TOTAL, FREET4, T3FREE, THYROIDAB in the last 72 hours. No results for input(s):  VITAMINB12, FOLATE, FERRITIN, TIBC, IRON, RETICCTPCT in the last 72 hours. ------------------------------------------------------------------------------------------------------------------ Cardiac Enzymes No results for input(s): CKMB, TROPONINI, MYOGLOBIN in the last 168 hours.  Invalid input(s): CK  Micro Results Recent Results (from the past 240 hours)  Aerobic/Anaerobic Culture w Gram Stain (surgical/deep wound)  Status: None   Collection Time: 08/14/23  6:13 PM   Specimen: Abscess  Result Value Ref Range Status   Specimen Description ABSCESS ABDOMEN  Final   Special Requests RLQ  Final   Gram Stain   Final    ABUNDANT WBC PRESENT, PREDOMINANTLY PMN ABUNDANT GRAM NEGATIVE RODS ABUNDANT GRAM POSITIVE COCCI IN PAIRS    Culture   Final    MODERATE PROTEUS MIRABILIS MODERATE ENTEROCOCCUS FAECALIS ABUNDANT BACTEROIDES THETAIOTAOMICRON BETA LACTAMASE POSITIVE Performed at [facility], [address] [place], [place] [address]    Report Status 08/18/2023 FINAL  Final   Organism ID, Bacteria PROTEUS MIRABILIS  Final   Organism ID, Bacteria ENTEROCOCCUS FAECALIS  Final      Susceptibility   Enterococcus faecalis - MIC*    AMPICILLIN  <=2 SENSITIVE Sensitive     VANCOMYCIN 1 SENSITIVE Sensitive     GENTAMICIN SYNERGY SENSITIVE Sensitive     * MODERATE ENTEROCOCCUS FAECALIS   Proteus mirabilis - MIC*    AMPICILLIN  <=2 SENSITIVE Sensitive     CEFEPIME  <=0.12 SENSITIVE Sensitive     CEFTAZIDIME <=1 SENSITIVE Sensitive     CEFTRIAXONE <=0.25 SENSITIVE Sensitive     CIPROFLOXACIN <=0.25 SENSITIVE Sensitive     GENTAMICIN <=1 SENSITIVE Sensitive     IMIPENEM >=16 RESISTANT Resistant     TRIMETH/SULFA <=20 SENSITIVE Sensitive     AMPICILLIN /SULBACTAM <=2 SENSITIVE Sensitive     PIP/TAZO <=4 SENSITIVE Sensitive ug/mL    * MODERATE PROTEUS MIRABILIS    Radiology Reports  DG C-Arm 1-60 Min Result Date: 08/18/2023 CLINICAL DATA:  Cystoscopy and  retrograde pyelogram with ureteral stent insertion EXAM: DG C-ARM 1-60 MIN FLUOROSCOPY: Fluoroscopy Time:  34.7 Radiation Exposure Index (if provided by the fluoroscopic device): 4.91 mGy Number of Acquired Spot Images: 1 COMPARISON:  CT abdomen pelvis 08/12/2023 FINDINGS: Contrast within the dilated ureter and collecting system. Multiple dilated loops of small bowel. IMPRESSION: Contrast within the dilated ureter collecting system during retrograde pyelogram. Dilated loops of small bowel similar to 08/12/2023. Electronically Signed   By: Norman Gatlin M.D.   On: 08/18/2023 17:11      Signature  -   Lavada Stank M.D on 08/20/2023 at 10:07 AM   -  To page go to [email or web address]

## 2023-08-20 NOTE — Progress Notes (Signed)
 PHARMACY - TOTAL PARENTERAL NUTRITION CONSULT NOTE  Indication: intolerance to feeding, recurrent ileus  Patient Measurements: Height: 5' 4 (162.6 cm) Weight: 64.4 kg (141 lb 15.6 oz) IBW/kg (Calculated) : 54.7 TPN AdjBW (KG): 61.2 Body mass index is 24.37 kg/m. Usual Weight: ~59 kg  Assessment:  72 YOF s/p exp lap with Hartmann's procedure for stercoral colitis with ischemia and perforation with feculent peritonitis on 6/22. Noted pt has been NPO/clear liquid/full liquid diet since after surgery with limited intake. Pt now with ileus on Xray (dilated loops of small bowel) and N/V with liquid diet so NGT replaced 7/4, now NPO and starting TPN. Pt at risk of refeeding with prolonged period of little po intake since admission.  Glucose / Insulin : no hx DM - CBGs < 110, off insulin   Electrolytes: K 3.8 (goal; >/= 4 for ileus), CoCa 10.2, phos 4 up, Mg 1.8 (goal >/= 2 for ileus), others wnl Renal: SCr 0.5, BUN WNL Hepatic: LFTs / TG / tbili WNL, albumin  1.6   Intake / Output; MIVF: UOP incompletely charted, NGT 0 ml charted (was 7/11), drain 95 ml, colostomy GI Imaging: 7/5 abd xray - persistent dilated loops of small bowel 7/5 CT - pelvic fluid collection slightly enlarged, suspicious for peritonitis, partially obstructing calculus in L ureter, ileus GI Surgeries / Procedures:  6/22 ex-lap with Hartmann's procedure for stercoral colitis with ischemia and perf with feculent peritonitis 7/8 CT-guided RLQ abd abscess and transgluteal pelvic drains [>411mL tan colored fluid] 7/10 NGT removed  7/11 NGT replaced   Central access: PICC placed 08/12/23 TPN start date: 08/12/23  Nutritional Goals: Goal TPN 70 ml/hr will provide 89g AA, 1660 kCal   RD Estimated Needs Total Energy Estimated Needs: 1650-1850 kcals Total Protein Estimated Needs: 80-90 g Total Fluid Estimated Needs: >/= 1.7 L  Current Nutrition:  TPN 7/10 CLD, Boost  7/11 NPO for urology stent procedure 7/12  CLD  Plan:  Continue TPN at goal rate of 70 ml/hr to provide 100% of needs Electrolytes in TPN: Na 125 mEq/L, increase K 70 mEq/L, decrease Ca 2 mEq/L, increase Mg 15 mEq/L (max), decrease Phos 10 mmol/L, Cl:Ac 1:1 Give PO Kcl 40 mEq x1 Give Mg 2g IV x1 Remove standard MVI and trace elements from TPN, give as PO tablet Monitor TPN labs on Mon/Thurs and PRN F/u PO intake and wean TPN as able  F/u repeat CT   Jinnie Door, PharmD, BCPS, BCCP Clinical Pharmacist  Please check AMION for all [facility] Pharmacy phone numbers After 10:00 PM, call Main Pharmacy [phone]

## 2023-08-20 NOTE — Progress Notes (Signed)
 2 Days Post-Op   Subjective/Chief Complaint: Patient with minimal nausea. Having output from stoma. NG still clamped despite order for LWS   Objective: Vital signs in last 24 hours: Temp:  [97.4 F (36.3 C)-97.8 F (36.6 C)] 97.8 F (36.6 C) (07/13 0500) Pulse Rate:  [66] 66 (07/12 1254) Resp:  [18-19] 19 (07/13 0500) BP: (127-159)/(58-70) 159/70 (07/13 0500) SpO2:  [96 %] 96 % (07/12 1254) Weight:  [64.4 kg] 64.4 kg (07/13 0500) Last BM Date : 08/19/23  Intake/Output from previous day: 07/12 0701 - 07/13 0700 In: 1818.1 [I.V.:1363.3; IV Piggyback:434.8] Out: 595 [Urine:400; Drains:95; Stool:100] Intake/Output this shift: No intake/output data recorded.  Lab Results:  Gen: NAD Lungs: effort nonlabored  Abd: soft, colostomy viable and with gas and liquid stool present.  Minimal distention. Midline dressing c/d/i . IR drains with purulent fluid.   BMET Recent Labs    08/19/23 0352 08/20/23 0349  NA 136 138  K 3.9 3.8  CL 102 103  CO2 26 28  GLUCOSE 125* 109*  BUN 18 21  CREATININE 0.55 0.50  CALCIUM  8.1* 8.4*   PT/INR No results for input(s): LABPROT, INR in the last 72 hours.  ABG No results for input(s): PHART, HCO3 in the last 72 hours.  Invalid input(s): PCO2, PO2  Studies/Results: DG C-Arm 1-60 Min Result Date: 08/18/2023 CLINICAL DATA:  Cystoscopy and retrograde pyelogram with ureteral stent insertion EXAM: DG C-ARM 1-60 MIN FLUOROSCOPY: Fluoroscopy Time:  34.7 Radiation Exposure Index (if provided by the fluoroscopic device): 4.91 mGy Number of Acquired Spot Images: 1 COMPARISON:  CT abdomen pelvis 08/12/2023 FINDINGS: Contrast within the dilated ureter and collecting system. Multiple dilated loops of small bowel. IMPRESSION: Contrast within the dilated ureter collecting system during retrograde pyelogram. Dilated loops of small bowel similar to 08/12/2023. Electronically Signed   By: Norman Gatlin M.D.   On: 08/18/2023 17:11      Anti-infectives: Anti-infectives (From admission, onward)    Start     Dose/Rate Route Frequency Ordered Stop   08/18/23 1800  Ampicillin -Sulbactam (UNASYN ) 3 g in sodium chloride  0.9 % 100 mL IVPB        3 g 200 mL/hr over 30 Minutes Intravenous Every 6 hours 08/18/23 0922     08/06/23 1130  acyclovir  (ZOVIRAX ) 200 MG capsule 400 mg  Status:  Discontinued        400 mg Oral 3 times daily 08/06/23 1037 08/08/23 1424   08/06/23 1100  piperacillin -tazobactam (ZOSYN ) IVPB 3.375 g        3.375 g 12.5 mL/hr over 240 Minutes Intravenous Every 8 hours 08/06/23 1051 08/18/23 1259   07/30/23 1800  piperacillin -tazobactam (ZOSYN ) IVPB 3.375 g        3.375 g 12.5 mL/hr over 240 Minutes Intravenous Every 8 hours 07/30/23 1332 08/04/23 2359   07/30/23 1400  piperacillin -tazobactam (ZOSYN ) IVPB 3.375 g  Status:  Discontinued        3.375 g 100 mL/hr over 30 Minutes Intravenous Every 8 hours 07/30/23 1329 07/30/23 1332   07/30/23 1015  piperacillin -tazobactam (ZOSYN ) IVPB 3.375 g        3.375 g 100 mL/hr over 30 Minutes Intravenous  Once 07/30/23 1013 07/30/23 1311       Assessment/Plan: Procedure(s): LAPAROTOMY, EXPLORATORY (N/A) COLECTOMY, SIGMOID, OPEN s/p Procedure(s): LAPAROTOMY, EXPLORATORY (N/A) COLECTOMY, SIGMOID, OPEN POD 21, s/p ex lap with Hartmann's procedure for stercoral colitis with ischemia and perforation with feculent peritonitis, Dr. Polly 6/22  - NG to LWS today - continue stomal  suppositories  - CT scan (7/5) shows collection remains sizable and has slightly enlarged compared with the previous study. This abuts the rectal suture line and a leak in this area cannot be excluded. - IR repositioned drains, both purulent. Cx with proteus mirabilis and enterococcus both sensitive to zosyn . Consider repeat scan when drain output decreases, early next week - Continue TNA - conitnue to mobilize as able. - BID dressing changes to midline wound  - WOC following - pulm  toilet  - BID PPI  FEN - clears, TPN VTE - lovenox   ID - zosyn  6/29>>   - per TRH -  Nephrolithiasis - per urology,  Chronic constipation - bowel regimen HTN Glaucoma  LOS: 21 days   Discussed plan of care with patient and family at bedside  Bernarda JAYSON Ned, MD  Colorectal and General Surgery [facility]

## 2023-08-21 DIAGNOSIS — K631 Perforation of intestine (nontraumatic): Secondary | ICD-10-CM | POA: Diagnosis not present

## 2023-08-21 LAB — GLUCOSE, CAPILLARY
Glucose-Capillary: 100 mg/dL — ABNORMAL HIGH (ref 70–99)
Glucose-Capillary: 100 mg/dL — ABNORMAL HIGH (ref 70–99)
Glucose-Capillary: 114 mg/dL — ABNORMAL HIGH (ref 70–99)
Glucose-Capillary: 117 mg/dL — ABNORMAL HIGH (ref 70–99)
Glucose-Capillary: 118 mg/dL — ABNORMAL HIGH (ref 70–99)
Glucose-Capillary: 85 mg/dL (ref 70–99)

## 2023-08-21 LAB — CBC WITH DIFFERENTIAL/PLATELET
Abs Immature Granulocytes: 0.69 K/uL — ABNORMAL HIGH (ref 0.00–0.07)
Basophils Absolute: 0.1 K/uL (ref 0.0–0.1)
Basophils Relative: 1 %
Eosinophils Absolute: 0.2 K/uL (ref 0.0–0.5)
Eosinophils Relative: 2 %
HCT: 23.4 % — ABNORMAL LOW (ref 36.0–46.0)
Hemoglobin: 7.3 g/dL — ABNORMAL LOW (ref 12.0–15.0)
Immature Granulocytes: 6 %
Lymphocytes Relative: 16 %
Lymphs Abs: 1.8 K/uL (ref 0.7–4.0)
MCH: 27.8 pg (ref 26.0–34.0)
MCHC: 31.2 g/dL (ref 30.0–36.0)
MCV: 89 fL (ref 80.0–100.0)
Monocytes Absolute: 0.9 K/uL (ref 0.1–1.0)
Monocytes Relative: 8 %
Neutro Abs: 7.6 K/uL (ref 1.7–7.7)
Neutrophils Relative %: 67 %
Platelets: 427 K/uL — ABNORMAL HIGH (ref 150–400)
RBC: 2.63 MIL/uL — ABNORMAL LOW (ref 3.87–5.11)
RDW: 16.5 % — ABNORMAL HIGH (ref 11.5–15.5)
Smear Review: NORMAL
WBC: 11.2 K/uL — ABNORMAL HIGH (ref 4.0–10.5)
nRBC: 0 % (ref 0.0–0.2)

## 2023-08-21 LAB — COMPREHENSIVE METABOLIC PANEL WITH GFR
ALT: 14 U/L (ref 0–44)
AST: 19 U/L (ref 15–41)
Albumin: 1.7 g/dL — ABNORMAL LOW (ref 3.5–5.0)
Alkaline Phosphatase: 84 U/L (ref 38–126)
Anion gap: 6 (ref 5–15)
BUN: 18 mg/dL (ref 8–23)
CO2: 27 mmol/L (ref 22–32)
Calcium: 8 mg/dL — ABNORMAL LOW (ref 8.9–10.3)
Chloride: 104 mmol/L (ref 98–111)
Creatinine, Ser: 0.47 mg/dL (ref 0.44–1.00)
GFR, Estimated: >60 mL/min (ref >60)
Glucose, Bld: 113 mg/dL — ABNORMAL HIGH (ref 70–99)
Potassium: 3.7 mmol/L (ref 3.5–5.1)
Sodium: 137 mmol/L (ref 135–145)
Total Bilirubin: 0.3 mg/dL (ref 0.0–1.2)
Total Protein: 5 g/dL — ABNORMAL LOW (ref 6.5–8.1)

## 2023-08-21 LAB — MAGNESIUM: Magnesium: 2 mg/dL (ref 1.7–2.4)

## 2023-08-21 LAB — C-REACTIVE PROTEIN: CRP: 1 mg/dL — ABNORMAL HIGH (ref <1.0)

## 2023-08-21 LAB — PHOSPHORUS: Phosphorus: 3.5 mg/dL (ref 2.5–4.6)

## 2023-08-21 LAB — TRIGLYCERIDES: Triglycerides: 152 mg/dL — ABNORMAL HIGH (ref <150)

## 2023-08-21 LAB — PROCALCITONIN: Procalcitonin: <0.1 ng/mL

## 2023-08-21 MED ORDER — ENSURE PLUS HIGH PROTEIN PO LIQD
237.0000 mL | Freq: Two times a day (BID) | ORAL | Status: DC
  Administered 2023-08-21 – 2023-09-04 (×15): 237 mL via ORAL

## 2023-08-21 MED ORDER — POTASSIUM CHLORIDE 20 MEQ PO PACK
40.0000 meq | PACK | ORAL | Status: AC
  Administered 2023-08-21: 40 meq via ORAL
  Filled 2023-08-21 (×2): qty 2

## 2023-08-21 MED ORDER — TRAVASOL 10 % IV SOLN
INTRAVENOUS | Status: AC
  Filled 2023-08-21: qty 890.4

## 2023-08-21 MED ORDER — POTASSIUM CHLORIDE CRYS ER 20 MEQ PO TBCR
40.0000 meq | EXTENDED_RELEASE_TABLET | ORAL | Status: DC
  Filled 2023-08-21: qty 2

## 2023-08-21 NOTE — Progress Notes (Signed)
 Patient ID: Angela [name], female   DOB: 1950-12-24, 73 y.o.   MRN: [id number] 3 Days Post-Op    Subjective: Tolerated some clears, spit up a little bile earlier ROS negative except as listed above. Objective: Vital signs in last 24 hours: Temp:  [97.4 F (36.3 C)-98 F (36.7 C)] 97.4 F (36.3 C) (07/14 0742) Pulse Rate:  [66] 66 (07/13 1417) Resp:  [12-20] 20 (07/14 0742) BP: (125-153)/(67-82) 145/74 (07/14 0456) SpO2:  [95 %] 95 % (07/13 1417) Weight:  [62.9 kg] 62.9 kg (07/14 0500) Last BM Date : 08/20/23  Intake/Output from previous day: 07/13 0701 - 07/14 0700 In: 15 [I.V.:5] Out: 675 [Urine:600; Drains:75] Intake/Output this shift: No intake/output data recorded.  General appearance: alert and cooperative GI: soft, wound OK, stoma with a good bit of stool  Lab Results: CBC  Recent Labs    08/20/23 0349 08/21/23 0317  WBC 10.6* 11.2*  HGB 7.6* 7.3*  HCT 24.0* 23.4*  PLT 435* 427*   BMET Recent Labs    08/20/23 0349 08/21/23 0317  NA 138 137  K 3.8 3.7  CL 103 104  CO2 28 27  GLUCOSE 109* 113*  BUN 21 18  CREATININE 0.50 0.47  CALCIUM  8.4* 8.0*   PT/INR No results for input(s): LABPROT, INR in the last 72 hours. ABG No results for input(s): PHART, HCO3 in the last 72 hours.  Invalid input(s): PCO2, PO2  Studies/Results: No results found.  Anti-infectives: Anti-infectives (From admission, onward)    Start     Dose/Rate Route Frequency Ordered Stop   08/18/23 1800  Ampicillin -Sulbactam (UNASYN ) 3 g in sodium chloride  0.9 % 100 mL IVPB        3 g 200 mL/hr over 30 Minutes Intravenous Every 6 hours 08/18/23 0922     08/06/23 1130  acyclovir  (ZOVIRAX ) 200 MG capsule 400 mg  Status:  Discontinued        400 mg Oral 3 times daily 08/06/23 1037 08/08/23 1424   08/06/23 1100  piperacillin -tazobactam (ZOSYN ) IVPB 3.375 g        3.375 g 12.5 mL/hr over 240 Minutes Intravenous Every 8 hours 08/06/23 1051 08/18/23 1259   07/30/23 1800   piperacillin -tazobactam (ZOSYN ) IVPB 3.375 g        3.375 g 12.5 mL/hr over 240 Minutes Intravenous Every 8 hours 07/30/23 1332 08/04/23 2359   07/30/23 1400  piperacillin -tazobactam (ZOSYN ) IVPB 3.375 g  Status:  Discontinued        3.375 g 100 mL/hr over 30 Minutes Intravenous Every 8 hours 07/30/23 1329 07/30/23 1332   07/30/23 1015  piperacillin -tazobactam (ZOSYN ) IVPB 3.375 g        3.375 g 100 mL/hr over 30 Minutes Intravenous  Once 07/30/23 1013 07/30/23 1311       Assessment/Plan: s/p ex lap with Hartmann's procedure for stercoral colitis with ischemia and perforation with feculent peritonitis, Dr. Polly 6/22  - try fulls, ostomy working - continue stomal suppositories  - CT scan (7/5) showed collection remains sizable and has slightly enlarged compared with the previous study. This abuts the rectal suture line and a leak in this area cannot be excluded. - IR repositioned drains, both purulent. Cx with proteus mirabilis and enterococcus both sensitive to zosyn . Consider repeat scan when drain output decreases, early next week - Continue TNA - conitnue to mobilize as able. - BID dressing changes to midline wound  - WOC following - pulm toilet  - BID PPI  FEN - fulle, TPN  VTE - lovenox   ID - zosyn  6/29>>   - per TRH -  Getting transfusion Nephrolithiasis - per urology,  Chronic constipation - bowel regimen HTN Glaucoma  I also spoke with her husband  LOS: 22 days    Dann Hummer, MD, MPH, FACS Trauma & General Surgery Use [email or web address] to contact on call provider  08/21/2023

## 2023-08-21 NOTE — Consult Note (Signed)
 WOC Nurse ostomy follow up Stoma type/location: LLQ colostomy  Stomal assessment/size:  1 3.4 inch, round, red, moist, ost at center Peristomal assessment: intact Treatment options for stomal/peristomal skin: 2 inch barrier ring Output brown stool Ostomy pouching: 2pc. Flat with barrier ring. 2 piece ostomy skin barrier (lawson #2), ostomy pouch (lawson [phone]), barrier rings (lawson [phone])  Education provided: Spouse demonstrated independently removing old ostomy appliance, cleaning around ostomy, application of barrier ring, skin barrier, and new pouch.  Able to verbalize frequency of pouch changes and emptying. Bedside RN present during visit and administered suppository into ostomy during appliance change. Enrolled patient in [employer] Secure Start Discharge program: Yes  WOC team will see patient on Monday for continued teaching/ reinforcement is still inpatient.   Thank you,  Doyal Polite, RN, MSN, [facility] WOC Team

## 2023-08-21 NOTE — Progress Notes (Signed)
 Nutrition Follow-up  DOCUMENTATION CODES:   Severe malnutrition in context of acute illness/injury  INTERVENTION:   Monitor for diet advancement and tolerance  Modify to Ensure Plus High Protein po BID, each supplement provides 350 kcal and 20 grams of protein   Start MVI w/ minerals, orally   TPN to meet estimated nutritional needs; TPN order per Pharmacy Daily weights on TPN    TPN providing: 1728 kcals and 90g protein (100% of estimated needs)   NUTRITION DIAGNOSIS:   Severe Malnutrition related to acute illness (stercoral colitis w/ ischemia and performation w/ feculent peritonitis) as evidenced by mild fat depletion, moderate fat depletion. - remains applicable  GOAL:  Patient will meet greater than or equal to 90% of their needs - meeting via TPN  MONITOR:  TF tolerance, I & O's, Diet advancement, Labs  REASON FOR ASSESSMENT:  Consult New TPN/TNA  ASSESSMENT:  73 yo female with stercoral colitis admitted on 6/22 with ischemia and perforation with feculent peritonitis requiring sigmoidectomy with end colostomy. Pt seen in ED on 6/21 with CT indicating colitis with large stool burden-discharged on antibiotics. Post-op course complicated by pelvic abscess and post-op ileus, inability to tolerate po. PMH includes chronic idiopathic constipation, HLD, HTN  6/22 Admitted, Ex Lap, open sigmoid colectomy with colostomy for stercoral colitis with ischemia and perforation with feculent peritonitis 6/23 NPO with ice chips 6/25 CL diet initiated 6/28 Advanced to [facility] diet 6/29 Back to NPO, CT A/P: complex free fluid with multiple foci of air, possible infection or bowel leak 6/30 CT-guided drainage of pelvic abscess by IR, drain left in place, ECHO EF 60-65% 7/01 Started back on CL diet 7/02 Advanced to [facility] Diet 7/03 Diet downgraded to CL  due to abdominal distention 7/04 Not tolerating CL diet, NG to LIS, NPO 7/05 Abd xray with persistent dilated loops of small bowel within  the left hemiabdomen measure up to 4.3 cm. PICC line placement with TPN initiation 7/07 Pelvic abscess culture: moderate proteus and enterococcus; IR removed old TG drain and replaced with two drains ( R TG 12Fr and RLQ 12 Fr) 7/07 CT-guided placement of abdominal abscess drain to RLQ - yield  7/08 TPN at goal rate 7/11 advanced to clear liquid diet 7/12 cytoscopy w/ L ureteral stent placement; L retrograde pyelogram 7/13 NGT removed 7/14 advanced to full liquid diet; transfused 1 unit PRBC  Spoke with patient and sister at bedside this morning. Patient up in chair at bedside. Biggest complaint is inability to sleep. Possible repeat CT early this week as drain output decreases.   Reports ostomy output is picking up. No output documented. TPN continues at goal rate. Advanced to full liquid diet yesterday. Drinking Ensure at time of visit. Does not prefer it, but understands the need to consume it. Will modify supplement full liquid appropriate supplement to maximize intake. No significant N/V. Some bilious appearing emesis documented this morning, per MD.  Dicussed nutrition support versus oral intake and preference for oral intake to meet majority of needs versus TPN. She verbalizes understanding. Yesterday's intake consisted of two apples, two fruit ices, and approximately three ounces of chicken broth. States she has tried bites of other foods as well. Trying a variety to assess tolerance.    Admit Weight: 57.2kg  Current Weight: 62.9kg Lowest Weight: 59.6kg on 7/09 +generalized, non-pitting to BLEs   Slight trend up since admission, which is desirable. She is noted with edema, although not significant. Continues with non-pitting, generalized edema to BLEs. Improved  from moderate and pitting in nature at time of last assessment. Will continue to monitor trend as edema resolves to assess true weight trend and update estimated needs as indicated. Stomal suppositories continue. Reports  ostomy changes are not daily, but bowels are starting to move.   Drains/Lines: R basilic: PICC (placed 7/05) RLQ: JP drain (12 Fr): 10ml x24 hours LLQ: colostomy: 0ml x24 hours - ?accuracy R buttock: JP drain (12 Fr): 65ml x24 hours UOP:  x24 hours   Refeeding labs have stabilized. Requiring potassium supplementation orally today. MVI now being taken orally and removed from TPN. Sodium and calcium  increased today. Potassium and PHOS content of TPN decreased today.    Labs: Sodium 133--->137 (wdl)  Potassium 3.7(wdl) BUN/Creatinine wdl Magnesium  2.0 (wdl) Phosphorus 3.5 (wdl) Hgb 7.6>7.6>7.3(L) CBGs 109-113 x24 hours   Meds:  Dulcolax Ferrous sulfate  Folic Acid  KCl  MVI w/ minerals Protonix  IV ABX   Diet Order:   Diet Order             Diet full liquid Fluid consistency: Thin  Diet effective now            EDUCATION NEEDS:  Education needs have been addressed  Skin:  Skin Assessment: Skin Integrity Issues: Skin Integrity Issues:: Incisions Incisions: abdomen (closed)  Last BM:  7/13 via ostomy - no output documented  Height:  Ht Readings from Last 1 Encounters:  08/18/23 5' 4 (1.626 m)   Weight:  Wt Readings from Last 1 Encounters:  08/21/23 62.9 kg   Ideal Body Weight:  54.55 kg  BMI:  Body mass index is 23.8 kg/m.  Estimated Nutritional Needs:   Kcal:  1650-1850 kcals  Protein:  80-90 g  Fluid:  >/= 1.7 L  Blair Deaner MS, RD, LDN Registered Dietitian Clinical Nutrition RD Inpatient Contact Info in Amion

## 2023-08-21 NOTE — Progress Notes (Signed)
 Pt ambulating on unit and unavailable for AM line.

## 2023-08-21 NOTE — Plan of Care (Signed)
  Problem: Education: Goal: Knowledge of General Education information will improve Description: Including pain rating scale, medication(s)/side effects and non-pharmacologic comfort measures Outcome: Progressing   Problem: Health Behavior/Discharge Planning: Goal: Ability to manage health-related needs will improve Outcome: Progressing Note: Encourage compliance/adherence with treatment plan for underlying cause Encourage participation in Archivist compliance/adherence with prescribed medication regimen   Problem: Clinical Measurements: Goal: Ability to maintain clinical measurements within normal limits will improve Outcome: Progressing   Problem: Coping: Goal: Level of anxiety will decrease Outcome: Progressing

## 2023-08-21 NOTE — Progress Notes (Signed)
 The patient tolerated walking to the bathroom with the use of a walker and one assist. Upon completion, the patient reported pain and requested their sleep aid, which was provided. PRN pain medication for pain was also administered. Shortly after taking the medications, the patient reported feeling nauseous. After a brief wait, the patient stated feeling better.   Approximately 10-15 minutes later, the patient's nephew called the nursing station to report the patient had vomited. Upon entering the room, the patient was found in green emesis. A further assessment revealed that there was no medication visible in the emesis.PRN Zofran  administered for nausea.  The patients abdominal dressing changed, and they were repositioned. Per-care provided, along with a complete linen and gown change. The patient tolerated the care fairly well.

## 2023-08-21 NOTE — Plan of Care (Signed)

## 2023-08-21 NOTE — Progress Notes (Signed)
 Physical Therapy Treatment Patient Details Name: Angela [name] MRN: [id number] DOB: 1950/04/02 Today's Date: 08/21/2023   History of Present Illness Angela [name] is a 73 y.o. female who presents with abdominal pain and bloating  Renal CT revealed interval development of intraperitoneal free air and moderate volume free fluid containing layering hyperdensities consistent with bowel perforation, distal left ureteral stone measuring 9 mm with interval decrease left hydroureteronephrosis. . Past medical history significant of hypertension, hyperlipidemia, glaucoma and prior tobacco abuse    PT Comments  Pt seen for PT tx with pt reporting fatigue & pain, noting she's already walked this morning. Pt agreeable to seated BLE exercises in hopes they will help BLE edema. PT educated pt on BLE exercises, pt unable to tolerate seated hip flexion 2/2 increased pain. Also discussed progression from RW to possibly SPC or no AD. Pt appreciative of session. Will continue to follow pt acutely.   If plan is discharge home, recommend the following: A little help with walking and/or transfers;A little help with bathing/dressing/bathroom;Assistance with cooking/housework;Direct supervision/assist for medications management;Assist for transportation;Help with stairs or ramp for entrance   Can travel by private vehicle        Equipment Recommendations  Rolling walker (2 wheels)    Recommendations for Other Services       Precautions / Restrictions Precautions Precautions: Fall;Other (comment) Precaution/Restrictions Comments: Multiple JP drains now along with ostomy bag. NG tube has been pulled Restrictions Weight Bearing Restrictions Per Provider Order: No     Mobility  Bed Mobility               General bed mobility comments: not tested, pt received & left sitting in recliner    Transfers                        Ambulation/Gait                   Stairs              Wheelchair Mobility     Tilt Bed    Modified Rankin (Stroke Patients Only)       Balance                                            Communication Communication Communication: No apparent difficulties  Cognition Arousal: Alert Behavior During Therapy: WFL for tasks assessed/performed   PT - Cognitive impairments: No apparent impairments                         Following commands: Intact      Cueing Cueing Techniques: Verbal cues  Exercises General Exercises - Lower Extremity Ankle Circles/Pumps: AROM, Seated, Both, 5 reps Long Arc Quad: Seated, AROM, Both, 10 reps Hip ABduction/ADduction: Seated, Strengthening, AROM, Both, 10 reps (hip adduction pillow squeezes) Hip Flexion/Marching:  (attempted in sitting but deferred after a few reps 2/2 pain)    General Comments        Pertinent Vitals/Pain Pain Assessment Pain Assessment: Faces Faces Pain Scale: Hurts little more Pain Location: abdomen Pain Descriptors / Indicators: Discomfort Pain Intervention(s): Limited activity within patient's tolerance    Home Living                          Prior Function  PT Goals (current goals can now be found in the care plan section) Acute Rehab PT Goals Patient Stated Goal: to get better PT Goal Formulation: With patient Time For Goal Achievement: 09/04/23 Potential to Achieve Goals: Good Progress towards PT goals: Progressing toward goals;Goals updated (updated transfer goal 2/2 progress)    Frequency    Min 3X/week      PT Plan      Co-evaluation              AM-PAC PT 6 Clicks Mobility   Outcome Measure  Help needed turning from your back to your side while in a flat bed without using bedrails?: A Little Help needed moving from lying on your back to sitting on the side of a flat bed without using bedrails?: A Little Help needed moving to and from a bed to a chair (including a wheelchair)?: A  Little Help needed standing up from a chair using your arms (e.g., wheelchair or bedside chair)?: A Little Help needed to walk in hospital room?: A Little Help needed climbing 3-5 steps with a railing? : A Little 6 Click Score: 18    End of Session   Activity Tolerance: Patient tolerated treatment well;Patient limited by fatigue;Patient limited by pain Patient left: in chair;with call bell/phone within reach;with family/visitor present   PT Visit Diagnosis: Other abnormalities of gait and mobility (R26.89);Pain Pain - part of body:  (abdomen)     Time: 8870-8857 PT Time Calculation (min) (ACUTE ONLY): 13 min  Charges:    $Therapeutic Activity: 8-22 mins PT General Charges $$ ACUTE PT VISIT: 1 Visit                     Richerd Pinal, PT, DPT 08/21/23, 11:49 AM   Richerd CHRISTELLA Pinal 08/21/2023, 11:48 AM

## 2023-08-21 NOTE — Progress Notes (Signed)
 PROGRESS NOTE        PATIENT DETAILS Name: Angela [name] Age: 73 y.o. Sex: female Date of Birth: Sep 25, 1950 Admit Date: 07/30/2023 Admitting Physician Maximino DELENA Sharps, MD ERE:[id number], [name], DO  Brief Summary: Patient is a 73 y.o.  female with history of HTN, HLD who presented with worsening abdominal pain-patient was found to have sigmoid perforation with feculent peritonitis in the setting of stercoral colitis.  Patient underwent sigmoidectomy with end colostomy on 6/22-unfortunately postop course complicated by ileus and pelvic residual abscesses requiring IR drain placement.    Significant events: 6/21>> evaluated in the ED for abdominal pain-found to have left mid ureteral stone-9 mm with hydronephrosis-and large volume of stool-managed with supportive care and discharged home. 6/22>> worsening abdominal pain-repeat CT scan consistent with bowel perforation-surgery consult-admit to [facility]. 6/29>> CT abdomen with pelvic abscess. 6/30>> CT-guided drain and pelvic abscess by IR 7/04>> vomiting-recurrent ileus-NG tube reinserted 7/05>> TNA started-repeat CT abdomen increased size of abscess. 7/07>> IR placed RLQ drain and right transgluteal pelvic drain. 7/10>> NGT removed  Significant studies: 6/22>> CT renal stone study: Interval development of intraperitoneal free air-moderate volume free fluid-consistent with bowel perforation, persistent large volume stool with dilated sigmoid colon.  Similar location of left ureteral stone-9 mm. 6/29>> CT abdomen/pelvis: S/p partial colectomy-LLQ ostomy-complex free fluid with multiple foci of air-most pronounced in the surgical bed-possibility of superimposed infection or bowel leak cannot be excluded. 6/30>> echo: EF 60-65%. 7/05>> CT abdomen/pelvis: Previous collection with pigtail has increased in size, other less well-defined but enlarging peritoneal fluid collections.  Persistent-partially obstructing 11 mm calculus  in the distal left ureter.  Persistent fluid loops of dilated small bowel-likely ileus.  Significant microbiology data: 6/22>> urine culture: No growth 6/22>> blood culture: No growth 6/30>> pelvic abscess culture: Gram-negative rods/gram-positive cocci-mixed anaerobic flora. 7/07>> Pelvic abscess culture:moderate proteus and enterococcus  Procedures: 6/22>> sigmoidectomy with end colostomy.  By general surgeon Dr. Polly 6/30>> CT-guided drain and pelvic abscess by IR 08/19/2023 1.  Cystoscopy with left ureteral stent placement. 2. left retrograde pyelogram with interpretation. 3. Fluoroscopy <1 hour with intraoperative interpretation by urologist Dr. Arlyss Foot  Consults: General Surgery IR Urology  Subjective:  Patient in bed, appears comfortable, denies any headache, no fever, no chest pain or pressure, no shortness of breath , no abdominal pain. No focal weakness.   Objective: Vitals: Blood pressure (!) 145/74, pulse 66, temperature (!) 97.4 F (36.3 C), temperature source Oral, resp. rate 20, height 5' 4 (1.626 m), weight 62.9 kg, SpO2 95%.   Exam:  Awake Alert, No new F.N deficits, Normal affect Hunter.AT,PERRAL Supple Neck, No JVD,   Symmetrical Chest wall movement, Good air movement bilaterally, CTAB RRR,No Gallops, Rubs or new Murmurs,  +ve B.Sounds, Abd Soft, No tenderness,   2 abdominal drains, colostomy bag with brown stool, right arm PICC line,   Assessment/Plan:  Sepsis secondary to stercoral colitis with ischemia/perforation of sigmoid colon with feculent peritonitis-s/p sigmoid colectomy/ostomy on 6/22-complicated by postoperative ileus and then residual pelvic abscess requiring CT-guided drain placement by on 6/30. Ileus seems to be slowly improving-multiple output in ostomy bag overnight.  NG tube removed yesterday Remains n.p.o. for ureteral stent placement later today. Remains on Zosyn  General Surgery following-will await further  recommendations. Continues to be on TNA, clear liquid diet started 08/19/2023.    Hydronephrosis secondary to distal left  ureteral stone Urology following-with recommendations for supportive care for now-now that her medical problems have more or less stabilized underwent left ureteral stent placement by urology Dr. Arlyss Foot on 08/18/2023  Hospital delirium Secondary to acute illness/narcotics Now resolved  Normocytic anemia Due to acute illness,+/- coffee-ground emesis on 7/7 Hemoglobin stable although low, type screen done will monitor, will likely require 1 to 2 units of packed RBCs soon.  Thrombocytosis Likely reactive Supportive care  Coffee-ground NG tube emesis Seems to have resolved-no longer coffee-ground emesis-bile appearing fluid in canister. Continue PPI Hb stable, staying around 7.5, from multiple blood draws and some perioperative blood loss along with him dilution.  Hypomagnesemia/hypophosphatemia/hypokalemia Secondary to NG tube suctioning Resolved.  HTN BP stable-all oral antihypertensives on hold due to n.p.o. status Continue with as needed IV hydralazine  and metoprolol .   Lower extremity edema Secondary to hypoalbuminemia/IV fluid resuscitation in the setting of acute illness Echo stable-UA negative for proteinuria Will start diuretics once she is a bit more stable.  Chronic idiopathic constipation Bowel function recovering-brown stools Once more stable-will need to be placed on a bowel regimen.  Odynophagia secondary to oral/aphthous ulcers Improved on Valtrex and Magic mouthwash Supportive care  Glaucoma Timolol  eyedrops  Insomnia Appears to be primarily due to pain-anxiety Schedule trazodone  at night If no response-use IV Benadryl   Debility/deconditioning PT OT eval-Home health recommended.  Code status:   Code Status: Full Code   DVT Prophylaxis: enoxaparin  (LOVENOX ) injection 40 mg Start: 08/17/23 1000 Place TED hose Start:  08/09/23 1000 SCDs Start: 07/30/23 1953   Family Communication: None at bedside.   Disposition Plan: Status is: Inpatient Remains inpatient appropriate because: Severity of illness   Planned Discharge Destination:Home health   Diet: Diet Order             Diet clear liquid Room service appropriate? Yes; Fluid consistency: Thin  Diet effective now                    Data Review:   Inpatient Medications  Scheduled Meds:  acetaminophen   1,000 mg Oral Q6H   bisacodyl   10 mg Rectal Daily   Chlorhexidine  Gluconate Cloth  6 each Topical Daily   enoxaparin  (LOVENOX ) injection  40 mg Subcutaneous Q24H   feeding supplement  1 Container Oral TID BM   ferrous sulfate   325 mg Oral BID WC   folic acid   1 mg Oral Daily   latanoprost   1 drop Both Eyes QHS   methocarbamol   500 mg Oral TID   Or   methocarbamol  (ROBAXIN ) injection  500 mg Intravenous TID   multivitamin with minerals  1 tablet Oral Daily   pantoprazole  (PROTONIX ) IV  40 mg Intravenous Q12H   sodium chloride  flush  5 mL Intracatheter Q8H   timolol   1 drop Both Eyes BID   traZODone   50 mg Oral QHS   Continuous Infusions:  ampicillin -sulbactam (UNASYN ) IV 3 g (08/21/23 0623)   TPN ADULT (ION) 70 mL/hr at 08/20/23 1708   PRN Meds:.albuterol , diphenhydrAMINE , hydrALAZINE , HYDROmorphone  (DILAUDID ) injection, melatonin, metoprolol  tartrate, ondansetron  **OR** ondansetron  (ZOFRAN ) IV, mouth rinse, oxyCODONE , phenol, sodium chloride  flush  DVT Prophylaxis  enoxaparin  (LOVENOX ) injection 40 mg Start: 08/17/23 1000 Place TED hose Start: 08/09/23 1000 SCDs Start: 07/30/23 1953   Recent Labs  Lab 08/17/23 0443 08/18/23 0345 08/19/23 0352 08/20/23 0349 08/21/23 0317  WBC 8.7 9.5 8.8 10.6* 11.2*  HGB 8.0* 8.2* 7.6* 7.6* 7.3*  HCT 25.5* 25.8* 23.9* 24.0* 23.4*  PLT  513* 469* 416* 435* 427*  MCV 89.5 88.4 88.8 89.2 89.0  MCH 28.1 28.1 28.3 28.3 27.8  MCHC 31.4 31.8 31.8 31.7 31.2  RDW 15.6* 15.8* 15.8* 16.2*  16.5*  LYMPHSABS  --   --  1.7 2.4 1.8  MONOABS  --   --  0.7 1.0 0.9  EOSABS  --   --  0.0 0.3 0.2  BASOSABS  --   --  0.1 0.1 0.1    Recent Labs  Lab 08/15/23 0431 08/16/23 0350 08/17/23 0443 08/18/23 0345 08/19/23 0352 08/20/23 0349 08/21/23 0317  NA 137 135 133*  --  136 138 137  K 4.0 4.3 4.2  --  3.9 3.8 3.7  CL 105 103 103  --  102 103 104  CO2 27 25 25   --  26 28 27   ANIONGAP 5 7 5   --  8 7 6   GLUCOSE 124* 125* 104*  --  125* 109* 113*  BUN 14 15 18   --  18 21 18   CREATININE 0.43* 0.58 0.74  --  0.55 0.50 0.47  AST  --   --  17  --  15 17 19   ALT  --   --  9  --  10 14 14   ALKPHOS  --   --  60  --  65 73 84  BILITOT  --   --  0.5  --  0.3 0.4 0.3  ALBUMIN   --   --  <1.5*  --  1.6* 1.7* 1.7*  CRP  --   --   --   --  2.6* 1.3* 1.0*  PROCALCITON  --   --   --   --  <0.10 <0.10 <0.10  INR 1.1  --   --   --   --   --   --   MG 1.8 2.2 1.9 2.1 1.9 1.8 2.0  PHOS 2.9 3.2 3.4  --  3.7 4.0 3.5  CALCIUM  7.5* 7.8* 7.8*  --  8.1* 8.4* 8.0*      Recent Labs  Lab 08/15/23 0431 08/16/23 0350 08/17/23 0443 08/18/23 0345 08/19/23 0352 08/20/23 0349 08/21/23 0317  CRP  --   --   --   --  2.6* 1.3* 1.0*  PROCALCITON  --   --   --   --  <0.10 <0.10 <0.10  INR 1.1  --   --   --   --   --   --   MG 1.8 2.2 1.9 2.1 1.9 1.8 2.0  CALCIUM  7.5* 7.8* 7.8*  --  8.1* 8.4* 8.0*    --------------------------------------------------------------------------------------------------------------- Lab Results  Component Value Date   CHOL 183 11/30/2021   HDL 62.70 11/30/2021   LDLCALC 101 (H) 11/30/2021   LDLDIRECT 124.0 10/23/2020   TRIG 152 (H) 08/21/2023   CHOLHDL 3 11/30/2021    Lab Results  Component Value Date   HGBA1C 6.0 11/30/2021   No results for input(s): TSH, T4TOTAL, FREET4, T3FREE, THYROIDAB in the last 72 hours. No results for input(s): VITAMINB12, FOLATE, FERRITIN, TIBC, IRON, RETICCTPCT in the last 72  hours. ------------------------------------------------------------------------------------------------------------------ Cardiac Enzymes No results for input(s): CKMB, TROPONINI, MYOGLOBIN in the last 168 hours.  Invalid input(s): CK  Radiology Reports  No results found.     Signature  -   Lavada Stank M.D on 08/21/2023 at 9:31 AM   -  To page go to [email or web address]

## 2023-08-21 NOTE — Progress Notes (Signed)
 PHARMACY - TOTAL PARENTERAL NUTRITION CONSULT NOTE  Indication: intolerance to feeding, recurrent ileus  Patient Measurements: Height: 5' 4 (162.6 cm) Weight: 62.9 kg (138 lb 10.7 oz) IBW/kg (Calculated) : 54.7 TPN AdjBW (KG): 61.2 Body mass index is 23.8 kg/m. Usual Weight: ~59 kg  Assessment:  72 YOF s/p exp lap with Hartmann's procedure for stercoral colitis with ischemia and perforation with feculent peritonitis on 6/22. Noted pt has been NPO/clear liquid/full liquid diet since after surgery with limited intake. Pt now with ileus on Xray (dilated loops of small bowel) and N/V with liquid diet so NGT replaced 7/4, now NPO and starting TPN. Pt at risk of refeeding with prolonged period of little po intake since admission.  Glucose / Insulin : no hx DM - CBGs < 180, off insulin  Electrolytes: K 3.7 post PO (goal >/= 4 for ileus), Mag 2 post 2gm IV, others WNL Renal: SCr < 1, BUN WNL Hepatic: LFTs / tbili WNL, albumin  1.7, TG mildly elevated at 150 Intake / Output; MIVF: UOP 0.4 ml/kg/hr, drain 125 mL, colostomy 0mL GI Imaging: 7/5 abd xray - persistent dilated loops of small bowel 7/5 CT - pelvic fluid collection slightly enlarged, suspicious for peritonitis, partially obstructing calculus in L ureter, ileus GI Surgeries / Procedures:  6/22 ex-lap with Hartmann's procedure for stercoral colitis with ischemia and perf with feculent peritonitis 7/8 CT-guided RLQ abd abscess and transgluteal pelvic drains [>451mL tan colored fluid] 7/10 NGT removed  7/11 NGT replaced   Central access: PICC placed 08/12/23 TPN start date: 08/12/23  Nutritional Goals: Goal TPN 70 ml/hr will provide 89g AA, 1660 kCal   RD Estimated Needs Total Energy Estimated Needs: 1650-1850 kcals Total Protein Estimated Needs: 80-90 g Total Fluid Estimated Needs: >/= 1.7 L  Current Nutrition:  TPN FLD started 7/14 Boost TID - 1 charted given yesterday  Plan:  Continue TPN at goal rate of 70 ml/hr to  provide 100% of needs Electrolytes in TPN: Na 125 mEq/L, increase K 70 mEq/L, decrease Ca 2 mEq/L, increase Mg 15 mEq/L (max) and decrease Phos 10 mmol/L on 7/13, Cl:Ac 1:1 Adult multivitamin and folate 1mg  PO daily (no multivitamin/trace elements in TPN) KCL 40mEq PO x 2 doses Monitor TPN labs on Mon/Thurs - labs in AM F/u PO intake and wean TPN as able  F/u repeat CT   Angela [name] D. [place], PharmD, BCPS, BCCCP 08/21/2023, 11:50 AM

## 2023-08-21 NOTE — TOC Progression Note (Addendum)
 Transition of Care [facility]) - Progression Note    Patient Details  Name: Angela [name] MRN: [id number] Date of Birth: 1950-04-23  Transition of Care [facility]) CM/SW Contact  Robynn Eileen Hoose, RN Phone Number: 08/21/2023, 11:56 AM  Clinical Narrative:   Progression rounds update: Chart reviewed, HH arranged through [facility] previously, DME equipment ordered through [facility] prior. Patient on clear liquid diet. Currently on IV Unasyn . Possible discharge in 3-4 days.         Expected Discharge Plan and Services                                               Social Determinants of Health (SDOH) Interventions SDOH Screenings   Food Insecurity: Patient Declined (08/02/2023)  Housing: Unknown (08/02/2023)  Transportation Needs: Patient Declined (08/02/2023)  Utilities: Patient Declined (08/02/2023)  Alcohol Screen: Low Risk  (04/19/2023)  Depression (PHQ2-9): Low Risk  (06/23/2023)  Financial Resource Strain: Patient Declined (04/19/2023)  Physical Activity: Sufficiently Active (04/19/2023)  Social Connections: Patient Declined (08/02/2023)  Stress: No Stress Concern Present (04/19/2023)  Tobacco Use: Medium Risk (07/30/2023)  Health Literacy: Adequate Health Literacy (04/19/2023)    Readmission Risk Interventions     No data to display

## 2023-08-22 DIAGNOSIS — K631 Perforation of intestine (nontraumatic): Secondary | ICD-10-CM | POA: Diagnosis not present

## 2023-08-22 LAB — CBC WITH DIFFERENTIAL/PLATELET
Abs Immature Granulocytes: 0.54 K/uL — ABNORMAL HIGH (ref 0.00–0.07)
Basophils Absolute: 0.1 K/uL (ref 0.0–0.1)
Basophils Relative: 1 %
Eosinophils Absolute: 0.3 K/uL (ref 0.0–0.5)
Eosinophils Relative: 2 %
HCT: 24.9 % — ABNORMAL LOW (ref 36.0–46.0)
Hemoglobin: 7.7 g/dL — ABNORMAL LOW (ref 12.0–15.0)
Immature Granulocytes: 4 %
Lymphocytes Relative: 15 %
Lymphs Abs: 2 K/uL (ref 0.7–4.0)
MCH: 28.3 pg (ref 26.0–34.0)
MCHC: 30.9 g/dL (ref 30.0–36.0)
MCV: 91.5 fL (ref 80.0–100.0)
Monocytes Absolute: 0.7 K/uL (ref 0.1–1.0)
Monocytes Relative: 6 %
Neutro Abs: 9.5 K/uL — ABNORMAL HIGH (ref 1.7–7.7)
Neutrophils Relative %: 72 %
Platelets: 462 K/uL — ABNORMAL HIGH (ref 150–400)
RBC: 2.72 MIL/uL — ABNORMAL LOW (ref 3.87–5.11)
RDW: 16.8 % — ABNORMAL HIGH (ref 11.5–15.5)
WBC: 13.1 K/uL — ABNORMAL HIGH (ref 4.0–10.5)
nRBC: 0 % (ref 0.0–0.2)

## 2023-08-22 LAB — COMPREHENSIVE METABOLIC PANEL WITH GFR
ALT: 14 U/L (ref 0–44)
AST: 17 U/L (ref 15–41)
Albumin: 1.9 g/dL — ABNORMAL LOW (ref 3.5–5.0)
Alkaline Phosphatase: 89 U/L (ref 38–126)
Anion gap: 9 (ref 5–15)
BUN: 22 mg/dL (ref 8–23)
CO2: 26 mmol/L (ref 22–32)
Calcium: 8.5 mg/dL — ABNORMAL LOW (ref 8.9–10.3)
Chloride: 103 mmol/L (ref 98–111)
Creatinine, Ser: 0.51 mg/dL (ref 0.44–1.00)
GFR, Estimated: >60 mL/min (ref >60)
Glucose, Bld: 94 mg/dL (ref 70–99)
Potassium: 4.2 mmol/L (ref 3.5–5.1)
Sodium: 138 mmol/L (ref 135–145)
Total Bilirubin: 0.3 mg/dL (ref 0.0–1.2)
Total Protein: 5.4 g/dL — ABNORMAL LOW (ref 6.5–8.1)

## 2023-08-22 LAB — PHOSPHORUS: Phosphorus: 3.4 mg/dL (ref 2.5–4.6)

## 2023-08-22 LAB — C-REACTIVE PROTEIN: CRP: 1 mg/dL — ABNORMAL HIGH (ref <1.0)

## 2023-08-22 LAB — PROCALCITONIN: Procalcitonin: <0.1 ng/mL

## 2023-08-22 LAB — GLUCOSE, CAPILLARY
Glucose-Capillary: 98 mg/dL (ref 70–99)
Glucose-Capillary: 83 mg/dL (ref 70–99)

## 2023-08-22 LAB — MAGNESIUM: Magnesium: 1.9 mg/dL (ref 1.7–2.4)

## 2023-08-22 MED ORDER — POTASSIUM CHLORIDE CRYS ER 20 MEQ PO TBCR
40.0000 meq | EXTENDED_RELEASE_TABLET | Freq: Once | ORAL | Status: DC
  Filled 2023-08-22: qty 2

## 2023-08-22 MED ORDER — TRAVASOL 10 % IV SOLN
INTRAVENOUS | Status: AC
  Filled 2023-08-22: qty 445.2

## 2023-08-22 MED ORDER — POLYETHYLENE GLYCOL 3350 17 G PO PACK
17.0000 g | PACK | Freq: Two times a day (BID) | ORAL | Status: DC
  Administered 2023-08-22 – 2023-09-04 (×16): 17 g via ORAL
  Filled 2023-08-22 (×21): qty 1

## 2023-08-22 MED ORDER — MAGNESIUM SULFATE 2 GM/50ML IV SOLN
2.0000 g | Freq: Once | INTRAVENOUS | Status: AC
  Administered 2023-08-22: 2 g via INTRAVENOUS
  Filled 2023-08-22: qty 50

## 2023-08-22 NOTE — Progress Notes (Signed)
 PROGRESS NOTE        PATIENT DETAILS Name: Angela [name] Age: 73 y.o. Sex: female Date of Birth: 1950/10/27 Admit Date: 07/30/2023 Admitting Physician Maximino DELENA Sharps, MD ERE:[id number], [name], DO  Brief Summary: Patient is a 73 y.o.  female with history of HTN, HLD who presented with worsening abdominal pain-patient was found to have sigmoid perforation with feculent peritonitis in the setting of stercoral colitis.  Patient underwent sigmoidectomy with end colostomy on 6/22-unfortunately postop course complicated by ileus and pelvic residual abscesses requiring IR drain placement.    Significant events: 6/21>> evaluated in the ED for abdominal pain-found to have left mid ureteral stone-9 mm with hydronephrosis-and large volume of stool-managed with supportive care and discharged home. 6/22>> worsening abdominal pain-repeat CT scan consistent with bowel perforation-surgery consult-admit to [facility]. 6/29>> CT abdomen with pelvic abscess. 6/30>> CT-guided drain and pelvic abscess by IR 7/04>> vomiting-recurrent ileus-NG tube reinserted 7/05>> TNA started-repeat CT abdomen increased size of abscess. 7/07>> IR placed RLQ drain and right transgluteal pelvic drain. 7/10>> NGT removed  Significant studies: 6/22>> CT renal stone study: Interval development of intraperitoneal free air-moderate volume free fluid-consistent with bowel perforation, persistent large volume stool with dilated sigmoid colon.  Similar location of left ureteral stone-9 mm. 6/29>> CT abdomen/pelvis: S/p partial colectomy-LLQ ostomy-complex free fluid with multiple foci of air-most pronounced in the surgical bed-possibility of superimposed infection or bowel leak cannot be excluded. 6/30>> echo: EF 60-65%. 7/05>> CT abdomen/pelvis: Previous collection with pigtail has increased in size, other less well-defined but enlarging peritoneal fluid collections.  Persistent-partially obstructing 11 mm calculus  in the distal left ureter.  Persistent fluid loops of dilated small bowel-likely ileus.  Significant microbiology data: 6/22>> urine culture: No growth 6/22>> blood culture: No growth 6/30>> pelvic abscess culture: Gram-negative rods/gram-positive cocci-mixed anaerobic flora. 7/07>> Pelvic abscess culture:moderate proteus and enterococcus  Procedures: 6/22>> sigmoidectomy with end colostomy.  By general surgeon Dr. Polly 6/30>> CT-guided drain and pelvic abscess by IR 08/19/2023 1.  Cystoscopy with left ureteral stent placement. 2. left retrograde pyelogram with interpretation. 3. Fluoroscopy <1 hour with intraoperative interpretation by urologist Dr. Arlyss Foot  Consults: General Surgery IR Urology  Subjective:  Patient in bed, appears comfortable, denies any headache, no fever, no chest pain or pressure, no shortness of breath , no abdominal pain. No focal weakness.   Objective: Vitals: Blood pressure 122/64, pulse 76, temperature 98.3 F (36.8 C), temperature source Oral, resp. rate 16, height 5' 4 (1.626 m), weight 62.9 kg, SpO2 96%.   Exam:  Awake Alert, No new F.N deficits, Normal affect [place].AT,PERRAL Supple Neck, No JVD,   Symmetrical Chest wall movement, Good air movement bilaterally, CTAB RRR,No Gallops, Rubs or new Murmurs,  +ve B.Sounds, Abd Soft, No tenderness,   2 abdominal drains, colostomy bag with brown stool, right arm PICC line,   Assessment/Plan:  Sepsis secondary to stercoral colitis with ischemia/perforation of sigmoid colon with feculent peritonitis-s/p sigmoid colectomy/ostomy on 6/22-complicated by postoperative ileus and then residual pelvic abscess requiring CT-guided drain placement by on 6/30. Ileus seems to be slowly improving-multiple output in ostomy bag overnight.  NG tube has been removed. Remains n.p.o. for ureteral stent placement later today. Remains on Zosyn  >> Unasyn   General Surgery following-will await further  recommendations. Continues to be on TNA, clear liquid diet started 08/19/2023.  Once the surgery advances her  diet will try and switch off TNA.  Hydronephrosis secondary to distal left ureteral stone Urology following-with recommendations for supportive care for now-now that her medical problems have more or less stabilized underwent left ureteral stent placement by urology Dr. Arlyss Foot on 08/18/2023  Hospital delirium Secondary to acute illness/narcotics Now resolved  Normocytic anemia Due to acute illness,+/- coffee-ground emesis on 7/7 Hemoglobin stable although low, type screen done will continue to monitor transfuse if she drops consistently close to hemoglobin of 7.  Thrombocytosis Likely reactive Supportive care  Coffee-ground NG tube emesis Seems to have resolved-no longer coffee-ground emesis-bile appearing fluid in canister. Continue PPI Hb stable, staying around 7.5, from multiple blood draws and some perioperative blood loss along with him dilution.  Hypomagnesemia/hypophosphatemia/hypokalemia Secondary to NG tube suctioning Resolved.  HTN BP stable-all oral antihypertensives on hold due to n.p.o. status Continue with as needed IV hydralazine  and metoprolol .   Lower extremity edema Secondary to hypoalbuminemia/IV fluid resuscitation in the setting of acute illness Echo stable-UA negative for proteinuria Will start diuretics once she is a bit more stable.  Chronic idiopathic constipation Bowel function recovering-brown stools Once more stable-will need to be placed on a bowel regimen.  Odynophagia secondary to oral/aphthous ulcers Improved on Valtrex and Magic mouthwash Supportive care  Glaucoma Timolol  eyedrops  Insomnia Appears to be primarily due to pain-anxiety Schedule trazodone  at night If no response-use IV Benadryl   Debility/deconditioning PT OT eval-Home health recommended.  Code status:   Code Status: Full Code   DVT  Prophylaxis: enoxaparin  (LOVENOX ) injection 40 mg Start: 08/17/23 1000 Place TED hose Start: 08/09/23 1000 SCDs Start: 07/30/23 1953   Family Communication: None at bedside.   Disposition Plan: Status is: Inpatient Remains inpatient appropriate because: Severity of illness   Planned Discharge Destination:Home health   Diet: Diet Order             Diet full liquid Fluid consistency: Thin  Diet effective now                    Data Review:   Inpatient Medications  Scheduled Meds:  acetaminophen   1,000 mg Oral Q6H   bisacodyl   10 mg Rectal Daily   Chlorhexidine  Gluconate Cloth  6 each Topical Daily   enoxaparin  (LOVENOX ) injection  40 mg Subcutaneous Q24H   feeding supplement  237 mL Oral BID BM   ferrous sulfate   325 mg Oral BID WC   folic acid   1 mg Oral Daily   latanoprost   1 drop Both Eyes QHS   methocarbamol   500 mg Oral TID   Or   methocarbamol  (ROBAXIN ) injection  500 mg Intravenous TID   multivitamin with minerals  1 tablet Oral Daily   pantoprazole  (PROTONIX ) IV  40 mg Intravenous Q12H   sodium chloride  flush  5 mL Intracatheter Q8H   timolol   1 drop Both Eyes BID   traZODone   50 mg Oral QHS   Continuous Infusions:  ampicillin -sulbactam (UNASYN ) IV 3 g (08/22/23 0504)   TPN ADULT (ION) 70 mL/hr at 08/21/23 2139   PRN Meds:.albuterol , diphenhydrAMINE , hydrALAZINE , HYDROmorphone  (DILAUDID ) injection, melatonin, metoprolol  tartrate, ondansetron  **OR** ondansetron  (ZOFRAN ) IV, mouth rinse, oxyCODONE , phenol, sodium chloride  flush  DVT Prophylaxis  enoxaparin  (LOVENOX ) injection 40 mg Start: 08/17/23 1000 Place TED hose Start: 08/09/23 1000 SCDs Start: 07/30/23 1953   Recent Labs  Lab 08/18/23 0345 08/19/23 0352 08/20/23 0349 08/21/23 0317 08/22/23 0400  WBC 9.5 8.8 10.6* 11.2* 13.1*  HGB 8.2* 7.6* 7.6* 7.3*  7.7*  HCT 25.8* 23.9* 24.0* 23.4* 24.9*  PLT 469* 416* 435* 427* 462*  MCV 88.4 88.8 89.2 89.0 91.5  MCH 28.1 28.3 28.3 27.8 28.3  MCHC  31.8 31.8 31.7 31.2 30.9  RDW 15.8* 15.8* 16.2* 16.5* 16.8*  LYMPHSABS  --  1.7 2.4 1.8 2.0  MONOABS  --  0.7 1.0 0.9 0.7  EOSABS  --  0.0 0.3 0.2 0.3  BASOSABS  --  0.1 0.1 0.1 0.1    Recent Labs  Lab 08/17/23 0443 08/18/23 0345 08/19/23 0352 08/20/23 0349 08/21/23 0317 08/22/23 0400  NA 133*  --  136 138 137 138  K 4.2  --  3.9 3.8 3.7 4.2  CL 103  --  102 103 104 103  CO2 25  --  26 28 27 26   ANIONGAP 5  --  8 7 6 9   GLUCOSE 104*  --  125* 109* 113* 94  BUN 18  --  18 21 18 22   CREATININE 0.74  --  0.55 0.50 0.47 0.51  AST 17  --  15 17 19 17   ALT 9  --  10 14 14 14   ALKPHOS 60  --  65 73 84 89  BILITOT 0.5  --  0.3 0.4 0.3 0.3  ALBUMIN  <1.5*  --  1.6* 1.7* 1.7* 1.9*  CRP  --   --  2.6* 1.3* 1.0* 1.0*  PROCALCITON  --   --  <0.10 <0.10 <0.10 <0.10  MG 1.9 2.1 1.9 1.8 2.0 1.9  PHOS 3.4  --  3.7 4.0 3.5 3.4  CALCIUM  7.8*  --  8.1* 8.4* 8.0* 8.5*      Recent Labs  Lab 08/17/23 0443 08/18/23 0345 08/19/23 0352 08/20/23 0349 08/21/23 0317 08/22/23 0400  CRP  --   --  2.6* 1.3* 1.0* 1.0*  PROCALCITON  --   --  <0.10 <0.10 <0.10 <0.10  MG 1.9 2.1 1.9 1.8 2.0 1.9  CALCIUM  7.8*  --  8.1* 8.4* 8.0* 8.5*    --------------------------------------------------------------------------------------------------------------- Lab Results  Component Value Date   CHOL 183 11/30/2021   HDL 62.70 11/30/2021   LDLCALC 101 (H) 11/30/2021   LDLDIRECT 124.0 10/23/2020   TRIG 152 (H) 08/21/2023   CHOLHDL 3 11/30/2021    Lab Results  Component Value Date   HGBA1C 6.0 11/30/2021   No results for input(s): TSH, T4TOTAL, FREET4, T3FREE, THYROIDAB in the last 72 hours. No results for input(s): VITAMINB12, FOLATE, FERRITIN, TIBC, IRON, RETICCTPCT in the last 72 hours. ------------------------------------------------------------------------------------------------------------------ Cardiac Enzymes No results for input(s): CKMB, TROPONINI, MYOGLOBIN in  the last 168 hours.  Invalid input(s): CK  Radiology Reports  No results found.     Signature  -   Lavada Stank M.D on 08/22/2023 at 8:56 AM   -  To page go to [email or web address]

## 2023-08-22 NOTE — Progress Notes (Signed)
 Progress Note  4 Days Post-Op  Subjective: Tolerating FLD, having some ostomy output still, mostly gas this AM.   Objective: Vital signs in last 24 hours: Temp:  [97.6 F (36.4 C)-98.5 F (36.9 C)] 98.5 F (36.9 C) (07/15 1134) Pulse Rate:  [76-85] 81 (07/15 1134) Resp:  [11-19] 15 (07/15 1134) BP: (122-160)/(64-81) 137/81 (07/15 1134) SpO2:  [93 %-100 %] 98 % (07/15 1134) Last BM Date : 08/21/23  Intake/Output from previous day: 07/14 0701 - 07/15 0700 In: 2325.3 [I.V.:2195.3; IV Piggyback:100] Out: 75 [Drains:75] Intake/Output this shift: Total I/O In: 820.3 [I.V.:820.3] Out: -   PE: Gen: NAD Lungs: effort nonlabored  Abd: soft, colostomy viable and with a little gas.  Mild distention. Midline dressing c/d/i . IR drains with seropurulent fluid.    Lab Results:  Recent Labs    08/21/23 0317 08/22/23 0400  WBC 11.2* 13.1*  HGB 7.3* 7.7*  HCT 23.4* 24.9*  PLT 427* 462*   BMET Recent Labs    08/21/23 0317 08/22/23 0400  NA 137 138  K 3.7 4.2  CL 104 103  CO2 27 26  GLUCOSE 113* 94  BUN 18 22  CREATININE 0.47 0.51  CALCIUM  8.0* 8.5*   PT/INR No results for input(s): LABPROT, INR in the last 72 hours. CMP     Component Value Date/Time   NA 138 08/22/2023 0400   NA 140 11/10/2007 0000   K 4.2 08/22/2023 0400   CL 103 08/22/2023 0400   CO2 26 08/22/2023 0400   GLUCOSE 94 08/22/2023 0400   BUN 22 08/22/2023 0400   BUN 10 11/10/2007 0000   CREATININE 0.51 08/22/2023 0400   CREATININE 0.74 06/26/2020 1522   CALCIUM  8.5 (L) 08/22/2023 0400   PROT 5.4 (L) 08/22/2023 0400   ALBUMIN  1.9 (L) 08/22/2023 0400   AST 17 08/22/2023 0400   ALT 14 08/22/2023 0400   ALKPHOS 89 08/22/2023 0400   BILITOT 0.3 08/22/2023 0400   GFRNONAA >60 08/22/2023 0400   Lipase     Component Value Date/Time   LIPASE <10 (L) 07/30/2023 0936       Studies/Results: No results found.  Anti-infectives: Anti-infectives (From admission, onward)    Start      Dose/Rate Route Frequency Ordered Stop   08/18/23 1800  Ampicillin -Sulbactam (UNASYN ) 3 g in sodium chloride  0.9 % 100 mL IVPB        3 g 200 mL/hr over 30 Minutes Intravenous Every 6 hours 08/18/23 0922     08/06/23 1130  acyclovir  (ZOVIRAX ) 200 MG capsule 400 mg  Status:  Discontinued        400 mg Oral 3 times daily 08/06/23 1037 08/08/23 1424   08/06/23 1100  piperacillin -tazobactam (ZOSYN ) IVPB 3.375 g        3.375 g 12.5 mL/hr over 240 Minutes Intravenous Every 8 hours 08/06/23 1051 08/18/23 1259   07/30/23 1800  piperacillin -tazobactam (ZOSYN ) IVPB 3.375 g        3.375 g 12.5 mL/hr over 240 Minutes Intravenous Every 8 hours 07/30/23 1332 08/04/23 2359   07/30/23 1400  piperacillin -tazobactam (ZOSYN ) IVPB 3.375 g  Status:  Discontinued        3.375 g 100 mL/hr over 30 Minutes Intravenous Every 8 hours 07/30/23 1329 07/30/23 1332   07/30/23 1015  piperacillin -tazobactam (ZOSYN ) IVPB 3.375 g        3.375 g 100 mL/hr over 30 Minutes Intravenous  Once 07/30/23 1013 07/30/23 1311        Assessment/Plan  s/p ex lap with Hartmann's procedure for stercoral colitis with ischemia and perforation with feculent peritonitis, Dr. Polly 6/22   - having ostomy output, add miralax  and advance to soft diet  - continue stomal suppositories  - CT scan (7/5) showed collection remains sizable and has slightly enlarged compared with the previous study. This abuts the rectal suture line and a leak in this area cannot be excluded. - IR repositioned drains, both purulent. Cx with proteus mirabilis and enterococcus both sensitive to zosyn . Consider repeat scan - will touch base with IR on timing  - start to wean TPN - conitnue to mobilize as able. - BID dressing changes to midline wound  - WOC following - pulm toilet  - BID PPI   FEN - soft, 1/2 TPN VTE - lovenox   ID - zosyn  6/29>>   - per TRH -  Getting transfusion Nephrolithiasis - per urology,  Chronic constipation - bowel  regimen HTN Glaucoma   LOS: 23 days    Angela [name], [facility] 08/22/2023, 11:47 AM Please see Amion for pager number during day hours 7:00am-4:30pm

## 2023-08-22 NOTE — Progress Notes (Signed)
 Referring Physician(s): Dr. Ann  Supervising Physician: Vanice Revel  Patient Status:  [facility] - In-pt  Chief Complaint:  S/p R TG 12Fr and RLQ 12 Fr drains placed 7/7 by Dr. Vanice.  Subjective:  Patient is up returning from the restroom with walker when I arrived for exam.  She states she has been feeling stronger than she had been.  Endorses continued abdominal soreness appropriate to recent surgery.  Denies fever, chills.  Brief Hospital Course: Angela [name] is a 73 y.o. female with a h/o HTN, HLD, glaucoma who recently underwent ex lap with sigmoidectomy and Hartmann's procedure on 6/22 with Dr. KANDICE Idler. She was started on IV antibiotics, her diet was gradually advanced, and patient initially seemed to be recovering well, but CT on 6/29 revealed complex free fluid w/ air in the surgical bed concerning for superimposed infection vs bowel leak. IR consulted for percutaneous drain placement.    Dr. Philip placed a drain into the fluid collection on 6/30.   A CT on 7/5 was notable for enlarged fluid collection. Found that TG drain was not having any output despite image read that it appeared in place.    Patient returned to IR 7/7. Dr. Philip found that drain had become retracted. He removed the old TG drain and replaced it with two drains: R TG 12Fr and RLQ 12 Fr.   Allergies: Codeine  Medications: Prior to Admission medications   Medication Sig Start Date End Date Taking? Authorizing Provider  albuterol  (VENTOLIN  HFA) 108 (90 Base) MCG/ACT inhaler Inhale 2 puffs into the lungs every 6 (six) hours as needed for wheezing or shortness of breath. 03/03/22  Yes Kuneff, Renee A, DO  Calcium  Polycarbophil (FIBER-CAPS PO) Take 1 capsule by mouth daily at 12 noon.   Yes [provider]  HYDROcodone -acetaminophen  (NORCO/VICODIN) 5-325 MG tablet Take 1 tablet by mouth every 6 (six) hours as needed for moderate pain (pain score 4-6). 07/29/23  Yes Zackowski, Scott, MD   hydrocortisone  (ANUSOL -HC) 25 MG suppository Place 1 suppository (25 mg total) rectally 2 (two) times daily. 06/23/23  Yes Kuneff, Renee A, DO  hydrocortisone  cream 1 % Apply 1 Application topically 2 (two) times daily. 06/08/23  Yes Kuneff, Renee A, DO  latanoprost  (XALATAN ) 0.005 % ophthalmic solution Place 1 drop into both eyes at bedtime. 08/11/17  Yes [provider]  LINZESS 72 MCG capsule Take 72 mcg by mouth daily before breakfast. 06/26/23  Yes [provider]  lisinopril  (ZESTRIL ) 10 MG tablet TAKE 1 TABLET BY MOUTH DAILY 05/22/23  Yes Kuneff, Renee A, DO  Multiple Vitamin (MULTIVITAMIN) tablet Take 1 tablet by mouth daily.   Yes [provider]  ondansetron  (ZOFRAN -ODT) 4 MG disintegrating tablet Take 1 tablet (4 mg total) by mouth every 8 (eight) hours as needed for nausea or vomiting. 07/29/23  Yes Zackowski, Scott, MD  Probiotic Product (DAILY PROBIOTIC PO) Take 1 capsule by mouth daily at 12 noon.   Yes [provider]  timolol  (TIMOPTIC ) 0.5 % ophthalmic solution Place 1 drop into both eyes 2 (two) times daily. 11/14/19  Yes [provider]  amoxicillin -clavulanate (AUGMENTIN ) 875-125 MG tablet Take 1 tablet by mouth every 12 (twelve) hours. Patient not taking: Reported on 07/31/2023 07/29/23   Zackowski, Scott, MD  atorvastatin  (LIPITOR) 20 MG tablet Take 1 tablet (20 mg total) by mouth at bedtime. Patient not taking: Reported on 02/22/2023 11/30/21   Catherine Fuller A, DO     Vital Signs: BP ROLLEN)  142/74 (BP Location: Left Arm)   Pulse 71   Temp 98.3 F (36.8 C) (Oral)   Resp 14   Ht 5' 4 (1.626 m)   Wt 138 lb 10.7 oz (62.9 kg)   SpO2 94%   BMI 23.80 kg/m   Physical Exam Cardiovascular:     Rate and Rhythm: Normal rate.  Pulmonary:     Effort: Pulmonary effort is normal.  Abdominal:     Palpations: Abdomen is soft.     Comments: Midline surgical wound and ostomy present. RLQ drain and TG drain present, unremarkable. Milky drainage  in bulbs.   Neurological:     Mental Status: She is alert.      Imaging: No results found.  Labs:  CBC: Recent Labs    08/19/23 0352 08/20/23 0349 08/21/23 0317 08/22/23 0400  WBC 8.8 10.6* 11.2* 13.1*  HGB 7.6* 7.6* 7.3* 7.7*  HCT 23.9* 24.0* 23.4* 24.9*  PLT 416* 435* 427* 462*    COAGS: Recent Labs    07/29/23 0826 08/07/23 0909 08/15/23 0431  INR 1.1 1.2 1.1    BMP: Recent Labs    08/19/23 0352 08/20/23 0349 08/21/23 0317 08/22/23 0400  NA 136 138 137 138  K 3.9 3.8 3.7 4.2  CL 102 103 104 103  CO2 26 28 27 26   GLUCOSE 125* 109* 113* 94  BUN 18 21 18 22   CALCIUM  8.1* 8.4* 8.0* 8.5*  CREATININE 0.55 0.50 0.47 0.51  GFRNONAA >60 >60 >60 >60    LIVER FUNCTION TESTS: Recent Labs    08/19/23 0352 08/20/23 0349 08/21/23 0317 08/22/23 0400  BILITOT 0.3 0.4 0.3 0.3  AST 15 17 19 17   ALT 10 14 14 14   ALKPHOS 65 73 84 89  PROT 5.2* 5.1* 5.0* 5.4*  ALBUMIN  1.6* 1.7* 1.7* 1.9*    Assessment and Plan:  Drain Location: RLQ, R TG Size: Fr size: 12 Fr Date of placement: 7/7  Currently to: Drain collection device: suction bulb 24 hour output:  Output by Drain (mL) 08/20/23 0701 - 08/20/23 1900 08/20/23 1901 - 08/21/23 0700 08/21/23 0701 - 08/21/23 1900 08/21/23 1901 - 08/22/23 0700 08/22/23 0701 - 08/22/23 1726  Closed System Drain Right RLQ Bulb (JP) 12 Fr.  10 5 15    Closed System Drain Right Buttock Bulb (JP) 12 Fr. 25 40 20 35     Interval imaging/drain manipulation:  Spoke to surgery team. Agree with plan for CT tonight, will review with IR MD once available tomorrow.   Current examination: Flushes/aspirates easily.  Insertion site unremarkable. Suture and stat lock in place. Dressed appropriately.  Milky output.   Plan: Continue TID flushes with 5 cc NS. Record output Q shift. Dressing changes QD or PRN if soiled.    Call IR APP or on call IR MD if difficulty flushing or sudden change in drain output.  Repeat imaging/possible  drain injection once output < 10 mL/QD (excluding flush material). Consideration for drain removal if output is < 10 mL/QD (excluding flush material), pending discussion with the providing surgical service.  Discharge planning: Please contact IR APP or on call IR MD prior to patient d/c to ensure appropriate follow up plans are in place. Typically patient will follow up with IR clinic 10-14 days post d/c for repeat imaging/possible drain injection. IR scheduler will contact patient with date/time of appointment. Patient will need to flush drain QD with 5 cc NS, record output QD, dressing changes every 2-3 days or earlier if soiled.  IR will continue to follow - please call with questions or concerns.     Electronically Signed: Laymon Coast, NP 08/22/2023, 5:22 PM   I spent a total of 15 Minutes at the the patient's bedside AND on the patient's hospital floor or unit, greater than 50% of which was counseling/coordinating care for s/p 7/7 x2 drain placement.

## 2023-08-22 NOTE — Plan of Care (Signed)
  Problem: Education: Goal: Knowledge of General Education information will improve Description: Including pain rating scale, medication(s)/side effects and non-pharmacologic comfort measures Outcome: Progressing   Problem: Clinical Measurements: Goal: Ability to maintain clinical measurements within normal limits will improve Outcome: Progressing Goal: Respiratory complications will improve Outcome: Progressing Goal: Cardiovascular complication will be avoided Outcome: Progressing   Problem: Activity: Goal: Risk for activity intolerance will decrease Outcome: Progressing   Problem: Nutrition: Goal: Adequate nutrition will be maintained Outcome: Progressing   Problem: Elimination: Goal: Will not experience complications related to urinary retention Outcome: Progressing   Problem: Pain Managment: Goal: General experience of comfort will improve and/or be controlled Outcome: Progressing   Problem: Skin Integrity: Goal: Risk for impaired skin integrity will decrease Outcome: Progressing

## 2023-08-22 NOTE — Progress Notes (Signed)
 PHARMACY - TOTAL PARENTERAL NUTRITION CONSULT NOTE  Indication: intolerance to feeding, recurrent ileus  Patient Measurements: Height: 5' 4 (162.6 cm) Weight: 62.9 kg (138 lb 10.7 oz) IBW/kg (Calculated) : 54.7 TPN AdjBW (KG): 61.2 Body mass index is 23.8 kg/m. Usual Weight: ~59 kg  Assessment:  72 YOF s/p exp lap with Hartmann's procedure for stercoral colitis with ischemia and perforation with feculent peritonitis on 6/22. Noted pt has been NPO/clear liquid/full liquid diet since after surgery with limited intake. Pt now with ileus on Xray (dilated loops of small bowel) and N/V with liquid diet so NGT replaced 7/4, now NPO and starting TPN. Pt at risk of refeeding with prolonged period of little po intake since admission.  Glucose / Insulin : no hx DM - CBGs < 180, off insulin  Electrolytes: K 4.2 up (post PO, goal >/= 4 for ileus), Mag 1.9 (goal >/= 2 for ileus), others WNL Renal: SCr < 1, BUN WNL Hepatic: LFTs / tbili WNL, albumin  1.7, TG mildly elevated at 152 (7/13) Intake / Output; MIVF: UOP not reliably documented, drain 75 mL, colostomy 0mL GI Imaging: 7/5 abd xray - persistent dilated loops of small bowel 7/5 CT - pelvic fluid collection slightly enlarged, suspicious for peritonitis, partially obstructing calculus in L ureter, ileus GI Surgeries / Procedures:  6/22 ex-lap with Hartmann's procedure for stercoral colitis with ischemia and perf with feculent peritonitis 7/8 CT-guided RLQ abd abscess and transgluteal pelvic drains [>429mL tan colored fluid] 7/10 NGT removed  7/11 NGT replaced   Central access: PICC placed 08/12/23 TPN start date: 08/12/23  Nutritional Goals: Goal TPN 70 ml/hr will provide 89g AA, 1660 kCal   RD Estimated Needs Total Energy Estimated Needs: 1650-1850 kcals Total Protein Estimated Needs: 80-90 g Total Fluid Estimated Needs: >/= 1.7 L  Current Nutrition:  TPN FLD started 7/14- Ensure 237 mL charted given   Plan:  Ween TPN to rate of  35 ml/hr to provide 50% of need per provider Electrolytes in TPN: Na 125 mEq/L, K 70 mEq/L, Ca 2 mEq/L, Mg 15 mEq/L, and Phos 10 mmol/L, Cl:Ac 1:1 Adult multivitamin and folate 1mg  PO daily (no multivitamin/trace elements in TPN) Give Mag 2g IV and Kcl 40 mEq PO  Monitor TPN labs on Mon/Thurs - labs in AM  Follow up intake to assess stopping TPN  Beuna Bolding, PharmD 08/22/2023, 10:32 AM

## 2023-08-22 NOTE — Progress Notes (Addendum)
 Nutrition Brief Note  Met with patient at bedside today to assess tolerance of yesterday's diet. Reports she has consumed some grits and apple juice with good tolerance. Continues with gas accumulation and belches frequently. Reports some ostomy output, although not excessive.  Estimated Nutritional Needs:  Kcal:  1650-1850 kcals Protein:  80-90 g Fluid:  >/= 1.7 L  Diet advanced to GI soft today and TPN cut in half. Will monitor tolerance of advanced diet. Discussed ways to meet her needs through supplementation and smaller, more frequent food offerings. She verbalizes understanding and is willing to follow this guidance. Per attending, will stop TPN tomorrow.   INTERVENTION:    Monitor for diet advancement and tolerance  Add Magic cup TID with meals, each supplement provides 290 kcal and 9 grams of protein    Continue to Ensure Plus High Protein po BID, each supplement provides 350 kcal and 20 grams of protein    Continue MVI w/ minerals   TPN to meet 50% estimated nutritional needs; TPN order per Pharmacy Daily weights on TPN     TPN providing: 830 kcals and 45g protein (50% of estimated needs)     NUTRITION DIAGNOSIS:    Severe Malnutrition related to acute illness (stercoral colitis w/ ischemia and performation w/ feculent peritonitis) as evidenced by mild fat depletion, moderate fat depletion. - remains applicable   GOAL:  Patient will meet greater than or equal to 90% of their needs - meeting via TPN   MONITOR:  TF tolerance, I & O's, Diet advancement, Labs  Blair Deaner MS, RD, LDN Registered Dietitian Clinical Nutrition RD Inpatient Contact Info in Amion

## 2023-08-22 NOTE — Progress Notes (Signed)
 Physical Therapy Treatment Patient Details Name: Angela [name] MRN: [id number] DOB: Aug 09, 1950 Today's Date: 08/22/2023   History of Present Illness 73 yo female presents to ED on 6/21 with abdominal pain. Workup for sepsis secondary to bowel perforation. S/p sigmoidectomy with end colostomy 6/22. 6/29 increased WBC and abd pain, s/p CT guided drain placement for pelvic abscess 6/30, repeat drain placement RLQ and R transgluteal pelvic drain 7/7. Pt also with hydroureteronephrosis secondary to L ureteral stone, s/p L ureteral stent placement and L retrograde pyelogram 7/11. PMH includes  HTN, HLD, glaucoma and prior tobacco abuse.    PT Comments  Pt was in bed with family present upon arrival. Pt required light assist with LE with bed mobility. Pt ambulated 230 ft with CGA in the hallways with no apparent LOB but the session was limited due to fatigue. Pt has good awareness of precautions. Pt remains a good candidate for physical therapy to address deficits and achieve goals.    If plan is discharge home, recommend the following: A little help with walking and/or transfers;A little help with bathing/dressing/bathroom;Assistance with cooking/housework;Direct supervision/assist for medications management;Assist for transportation;Help with stairs or ramp for entrance   Can travel by private vehicle        Equipment Recommendations       Recommendations for Other Services       Precautions / Restrictions Precautions Precautions: Fall;Other (comment) Recall of Precautions/Restrictions: Intact Precaution/Restrictions Comments: Multiple JP drains now along with ostomy bag. NG tube has been pulled Restrictions Weight Bearing Restrictions Per Provider Order: No     Mobility  Bed Mobility Overal bed mobility: Needs Assistance Bed Mobility: Supine to Sit, Sit to Supine     Supine to sit: Min assist Sit to supine: Min assist   General bed mobility comments: min A for BLEs in/out of bed.  Cues for log roll technique    Transfers Overall transfer level: Needs assistance   Transfers: Sit to/from Stand Sit to Stand: Contact guard assist                Ambulation/Gait Ambulation/Gait assistance: Contact guard assist, +2 safety/equipment Gait Distance (Feet): 230 Feet   Gait Pattern/deviations: Decreased stride length, Step-through pattern Gait velocity: decreased     General Gait Details: +2 assistance for lines and leads. No LOB noted. Cues for bigger steps   Stairs             Wheelchair Mobility     Tilt Bed    Modified Rankin (Stroke Patients Only)       Balance Overall balance assessment: Needs assistance Sitting-balance support: Feet supported Sitting balance-Leahy Scale: Good     Standing balance support: During functional activity Standing balance-Leahy Scale: Good                              Communication Communication Communication: No apparent difficulties  Cognition Arousal: Alert Behavior During Therapy: WFL for tasks assessed/performed                             Following commands: Intact      Cueing Cueing Techniques: Verbal cues  Exercises      General Comments General comments (skin integrity, edema, etc.): VSS on RA      Pertinent Vitals/Pain Pain Assessment Pain Assessment: Faces Faces Pain Scale: Hurts a little bit Pain Location: abdomen Pain Descriptors / Indicators: Discomfort Pain  Intervention(s): Monitored during session, Limited activity within patient's tolerance    Home Living                          Prior Function            PT Goals (current goals can now be found in the care plan section) Acute Rehab PT Goals Patient Stated Goal: to get better PT Goal Formulation: With patient Time For Goal Achievement: 09/04/23 Potential to Achieve Goals: Good Progress towards PT goals: Progressing toward goals    Frequency    Min 3X/week      PT  Plan      Co-evaluation              AM-PAC PT 6 Clicks Mobility   Outcome Measure  Help needed turning from your back to your side while in a flat bed without using bedrails?: A Little Help needed moving from lying on your back to sitting on the side of a flat bed without using bedrails?: A Little Help needed moving to and from a bed to a chair (including a wheelchair)?: A Little Help needed standing up from a chair using your arms (e.g., wheelchair or bedside chair)?: A Little Help needed to walk in hospital room?: A Little Help needed climbing 3-5 steps with a railing? : A Little 6 Click Score: 18    End of Session Equipment Utilized During Treatment: Gait belt Activity Tolerance: Patient tolerated treatment well;Patient limited by fatigue Patient left: with call bell/phone within reach;with family/visitor present;in bed (Bed alarm not on upon arrival. Pt states she will use call bell when needing help to get up) Nurse Communication: Mobility status PT Visit Diagnosis: Other abnormalities of gait and mobility (R26.89);Pain     Time: 8563-8542 PT Time Calculation (min) (ACUTE ONLY): 21 min  Charges:    $Gait Training: 8-22 mins PT General Charges $$ ACUTE PT VISIT: 1 Visit                     Quintin Campi, SPT  Acute Rehab  [phone]    Quintin Campi 08/22/2023, 4:36 PM

## 2023-08-22 NOTE — Progress Notes (Signed)
 Occupational Therapy Treatment Patient Details Name: Angela [name] MRN: [id number] DOB: 1950/05/24 Today's Date: 08/22/2023   History of present illness Angela [name] is a 73 y.o. female who presents with abdominal pain and bloating  Renal CT revealed interval development of intraperitoneal free air and moderate volume free fluid containing layering hyperdensities consistent with bowel perforation, distal left ureteral stone measuring 9 mm with interval decrease left hydroureteronephrosis. . Past medical history significant of hypertension, hyperlipidemia, glaucoma and prior tobacco abuse   OT comments  Pt doing well, resting in bed comfortably. Assisted RN with donning compression socks, Pt tolerates well. Pt min A to assist with bed mobility, has difficulty lifting BLEs on/off bed. Pt overall set up/supervision for ADLs, needs min A for LB ADLs, not able to pick up socks off floor when dropped. Pt supervision with RW for mobility 400 feet, no LOB. Pt able to manage toileting with supervision. Will continue to see Pt acutely to progress as able, HHOT still recommended.       If plan is discharge home, recommend the following:  A little help with bathing/dressing/bathroom;Assistance with cooking/housework   Equipment Recommendations  Tub/shower seat    Recommendations for Other Services      Precautions / Restrictions Precautions Precautions: Fall;Other (comment) Recall of Precautions/Restrictions: Intact Precaution/Restrictions Comments: Multiple JP drains now along with ostomy bag. NG tube has been pulled Restrictions Weight Bearing Restrictions Per Provider Order: No       Mobility Bed Mobility Overal bed mobility: Needs Assistance Bed Mobility: Supine to Sit, Sit to Supine     Supine to sit: Min assist Sit to supine: Min assist   General bed mobility comments: min A for BLEs in/out of bed    Transfers Overall transfer level: Needs assistance Equipment used: Rolling  walker (2 wheels) Transfers: Sit to/from Stand Sit to Stand: Supervision           General transfer comment: supervision for mobility with RW     Balance Overall balance assessment: Mild deficits observed, not formally tested                                         ADL either performed or assessed with clinical judgement   ADL Overall ADL's : Needs assistance/impaired             Lower Body Bathing: Minimal assistance       Lower Body Dressing: Minimal assistance                 General ADL Comments: set up/supervision for most ADLs min A for LB ADLs.    Extremity/Trunk Assessment Upper Extremity Assessment Upper Extremity Assessment: Overall WFL for tasks assessed            Vision       Perception     Praxis     Communication Communication Communication: No apparent difficulties   Cognition Arousal: Alert Behavior During Therapy: WFL for tasks assessed/performed Cognition: No apparent impairments                               Following commands: Intact        Cueing   Cueing Techniques: Verbal cues  Exercises      Shoulder Instructions       General Comments      Pertinent  Vitals/ Pain       Pain Assessment Pain Assessment: No/denies pain  Home Living                                          Prior Functioning/Environment              Frequency  Min 2X/week        Progress Toward Goals  OT Goals(current goals can now be found in the care plan section)  Progress towards OT goals: Progressing toward goals  Acute Rehab OT Goals Patient Stated Goal: to return home OT Goal Formulation: With patient Time For Goal Achievement: 09/05/23 Potential to Achieve Goals: Good ADL Goals Pt Will Perform Grooming: with supervision;standing Pt Will Perform Lower Body Dressing: with supervision;sit to/from stand Pt Will Transfer to Toilet: with supervision;ambulating Pt Will  Perform Toileting - Clothing Manipulation and hygiene: with supervision;sit to/from stand Pt Will Perform Tub/Shower Transfer: Shower transfer;with supervision;ambulating  Plan      Co-evaluation                 AM-PAC OT 6 Clicks Daily Activity     Outcome Measure   Help from another person eating meals?: None Help from another person taking care of personal grooming?: A Little Help from another person toileting, which includes using toliet, bedpan, or urinal?: A Little Help from another person bathing (including washing, rinsing, drying)?: A Little Help from another person to put on and taking off regular upper body clothing?: A Little Help from another person to put on and taking off regular lower body clothing?: A Little 6 Click Score: 19    End of Session Equipment Utilized During Treatment: Gait belt;Rolling walker (2 wheels)  OT Visit Diagnosis: Unsteadiness on feet (R26.81);Other abnormalities of gait and mobility (R26.89);Pain;Muscle weakness (generalized) (M62.81)   Activity Tolerance Patient tolerated treatment well   Patient Left in bed;with call bell/phone within reach;with family/visitor present   Nurse Communication Mobility status        Time: 8678-8650 OT Time Calculation (min): 28 min  Charges: OT General Charges $OT Visit: 1 Visit OT Treatments $Self Care/Home Management : 8-22 mins $Therapeutic Activity: 8-22 mins  Evangaline Jou, OTR/L   [name] 08/22/2023, 1:57 PM

## 2023-08-23 ENCOUNTER — Inpatient Hospital Stay (HOSPITAL_COMMUNITY)

## 2023-08-23 DIAGNOSIS — K631 Perforation of intestine (nontraumatic): Secondary | ICD-10-CM | POA: Diagnosis not present

## 2023-08-23 LAB — CBC WITH DIFFERENTIAL/PLATELET
Abs Immature Granulocytes: 0.27 K/uL — ABNORMAL HIGH (ref 0.00–0.07)
Basophils Absolute: 0.1 K/uL (ref 0.0–0.1)
Basophils Relative: 1 %
Eosinophils Absolute: 0.3 K/uL (ref 0.0–0.5)
Eosinophils Relative: 3 %
HCT: 23.3 % — ABNORMAL LOW (ref 36.0–46.0)
Hemoglobin: 7.4 g/dL — ABNORMAL LOW (ref 12.0–15.0)
Immature Granulocytes: 3 %
Lymphocytes Relative: 18 %
Lymphs Abs: 1.9 K/uL (ref 0.7–4.0)
MCH: 28.9 pg (ref 26.0–34.0)
MCHC: 31.8 g/dL (ref 30.0–36.0)
MCV: 91 fL (ref 80.0–100.0)
Monocytes Absolute: 0.6 K/uL (ref 0.1–1.0)
Monocytes Relative: 6 %
Neutro Abs: 7.5 K/uL (ref 1.7–7.7)
Neutrophils Relative %: 69 %
Platelets: 445 K/uL — ABNORMAL HIGH (ref 150–400)
RBC: 2.56 MIL/uL — ABNORMAL LOW (ref 3.87–5.11)
RDW: 17.1 % — ABNORMAL HIGH (ref 11.5–15.5)
WBC: 10.6 K/uL — ABNORMAL HIGH (ref 4.0–10.5)
nRBC: 0 % (ref 0.0–0.2)

## 2023-08-23 LAB — GLUCOSE, CAPILLARY
Glucose-Capillary: 100 mg/dL — ABNORMAL HIGH (ref 70–99)
Glucose-Capillary: 102 mg/dL — ABNORMAL HIGH (ref 70–99)
Glucose-Capillary: 110 mg/dL — ABNORMAL HIGH (ref 70–99)
Glucose-Capillary: 110 mg/dL — ABNORMAL HIGH (ref 70–99)
Glucose-Capillary: 92 mg/dL (ref 70–99)

## 2023-08-23 LAB — TYPE AND SCREEN
ABO/RH(D): O POS
Antibody Screen: NEGATIVE

## 2023-08-23 LAB — COMPREHENSIVE METABOLIC PANEL WITH GFR
ALT: 15 U/L (ref 0–44)
AST: 18 U/L (ref 15–41)
Albumin: 1.7 g/dL — ABNORMAL LOW (ref 3.5–5.0)
Alkaline Phosphatase: 75 U/L (ref 38–126)
Anion gap: 9 (ref 5–15)
BUN: 22 mg/dL (ref 8–23)
CO2: 27 mmol/L (ref 22–32)
Calcium: 8.1 mg/dL — ABNORMAL LOW (ref 8.9–10.3)
Chloride: 102 mmol/L (ref 98–111)
Creatinine, Ser: 0.52 mg/dL (ref 0.44–1.00)
GFR, Estimated: >60 mL/min (ref >60)
Glucose, Bld: 90 mg/dL (ref 70–99)
Potassium: 3.8 mmol/L (ref 3.5–5.1)
Sodium: 138 mmol/L (ref 135–145)
Total Bilirubin: 0.3 mg/dL (ref 0.0–1.2)
Total Protein: 4.8 g/dL — ABNORMAL LOW (ref 6.5–8.1)

## 2023-08-23 LAB — PHOSPHORUS: Phosphorus: 3.7 mg/dL (ref 2.5–4.6)

## 2023-08-23 LAB — PROCALCITONIN: Procalcitonin: <0.1 ng/mL

## 2023-08-23 LAB — MAGNESIUM: Magnesium: 2 mg/dL (ref 1.7–2.4)

## 2023-08-23 MED ORDER — TRAVASOL 10 % IV SOLN
INTRAVENOUS | Status: AC
  Filled 2023-08-23: qty 890.4

## 2023-08-23 MED ORDER — IOHEXOL 9 MG/ML PO SOLN
500.0000 mL | ORAL | Status: AC
  Administered 2023-08-23 (×2): 500 mL via ORAL

## 2023-08-23 MED ORDER — IOHEXOL 350 MG/ML SOLN
75.0000 mL | Freq: Once | INTRAVENOUS | Status: AC | PRN
  Administered 2023-08-23: 75 mL via INTRAVENOUS

## 2023-08-23 NOTE — Plan of Care (Signed)
  Problem: Education: Goal: Knowledge of General Education information will improve Description: Including pain rating scale, medication(s)/side effects and non-pharmacologic comfort measures Outcome: Progressing   Problem: Clinical Measurements: Goal: Respiratory complications will improve Outcome: Progressing   Problem: Safety: Goal: Ability to remain free from injury will improve Outcome: Progressing   Problem: Health Behavior/Discharge Planning: Goal: Ability to manage health-related needs will improve Outcome: Not Progressing   Problem: Activity: Goal: Risk for activity intolerance will decrease Outcome: Not Progressing   Problem: Nutrition: Goal: Adequate nutrition will be maintained Outcome: Not Progressing   Problem: Pain Managment: Goal: General experience of comfort will improve and/or be controlled Outcome: Not Progressing   Problem: Skin Integrity: Goal: Risk for impaired skin integrity will decrease Outcome: Not Progressing   Problem: Safety: Goal: Non-violent Restraint(s) Outcome: Completed/Met

## 2023-08-23 NOTE — Progress Notes (Signed)
 PHARMACY - TOTAL PARENTERAL NUTRITION CONSULT NOTE  Indication: intolerance to feeding, recurrent ileus  Patient Measurements: Height: 5' 4 (162.6 cm) Weight: 61.8 kg (136 lb 3.9 oz) IBW/kg (Calculated) : 54.7 TPN AdjBW (KG): 61.2 Body mass index is 23.39 kg/m. Usual Weight: ~59 kg  Assessment:  72 YOF s/p exp lap with Hartmann's procedure for stercoral colitis with ischemia and perforation with feculent peritonitis on 6/22. Noted pt has been NPO/clear liquid/full liquid diet since after surgery with limited intake. Pt now with ileus on Xray (dilated loops of small bowel) and N/V with liquid diet so NGT replaced 7/4, now NPO and starting TPN. Pt at risk of refeeding with prolonged period of little po intake since admission.  Glucose / Insulin : no hx DM - CBGs < 180, off insulin  Electrolytes: K 3.8 down (KCL ordered but not given, goal >/= 4 for ileus), Mag 2.0 (post 2 g IV, goal >/= 2 for ileus), others WNL Renal: SCr < 1, BUN WNL Hepatic: LFTs / tbili WNL, albumin  1.7, TG mildly elevated at 152 (7/13) Intake / Output; MIVF: UOP not reliably documented, drain 55 mL, colostomy 0mL GI Imaging: 7/5 abd xray - persistent dilated loops of small bowel 7/5 CT - pelvic fluid collection slightly enlarged, suspicious for peritonitis, partially obstructing calculus in L ureter, ileus 7/16 pending repeat CT  GI Surgeries / Procedures:  6/22 ex-lap with Hartmann's procedure for stercoral colitis with ischemia and perf with feculent peritonitis 7/8 CT-guided RLQ abd abscess and transgluteal pelvic drains [>440mL tan colored fluid] 7/10 NGT removed  7/11 NGT replaced  7/16 ureteral stent placement   Central access: PICC placed 08/12/23 TPN start date: 08/12/23  Nutritional Goals: Goal TPN 70 ml/hr will provide 89g AA, 1660 kCal   RD Estimated Needs Total Energy Estimated Needs: 1650-1850 kcals Total Protein Estimated Needs: 80-90 g Total Fluid Estimated Needs: >/= 1.7 L  Current Nutrition:   TPN + FLD started 7/14 Patient drank very little Ensure yesterday and ate ~2-3 oz grits due to nausea after eating. Vomiting stool this morning per nurse, Ensure not given.  Plan:  Increase TPN rate back to goal 70 ml/hr to provide 100% of need Electrolytes in TPN: Na 125 mEq/L, K 55 mEq/L, Ca 2 mEq/L, Mg 15 mEq/L, and Phos 7 mmol/L, Cl:Ac 1:1 (adjusted for TPN rate) Add multivitamin trace elements to TPN  Monitor TPN labs on Mon/Thurs - labs in AM  Follow up with CT results  Angela [name], PharmD 08/23/2023, 7:59 AM

## 2023-08-23 NOTE — Plan of Care (Signed)
 Pt has rested quietly throughout the night with no distress noted. Alert and oriented. On room air. SR on the monitor. Pt used purewick during night. Up to BR the rest of the time assist X1. Family member stayed at bedside. Medicated for pain twice with relief noted. No other complaints voiced.     Problem: Education: Goal: Knowledge of General Education information will improve Description: Including pain rating scale, medication(s)/side effects and non-pharmacologic comfort measures Outcome: Progressing   Problem: Health Behavior/Discharge Planning: Goal: Ability to manage health-related needs will improve Outcome: Progressing   Problem: Clinical Measurements: Goal: Respiratory complications will improve Outcome: Progressing Goal: Cardiovascular complication will be avoided Outcome: Progressing   Problem: Activity: Goal: Risk for activity intolerance will decrease Outcome: Progressing   Problem: Pain Managment: Goal: General experience of comfort will improve and/or be controlled Outcome: Progressing

## 2023-08-23 NOTE — Progress Notes (Signed)
 Progress Note  5 Days Post-Op  Subjective: Patient provided history along with daughter. Reports vomiting once this morning upon my arrive. Prior to this, she had eaten soft foods yesterday afternoon. Did not eat this morning. Ostomy has been draining brown stool. Patient reports generalized abdominal soreness without significant pain. Denies nausea currently.   Objective: Vital signs in last 24 hours: Temp:  [98 F (36.7 C)-98.6 F (37 C)] 98.4 F (36.9 C) (07/16 0700) Pulse Rate:  [71-81] 72 (07/16 0300) Resp:  [11-18] 11 (07/16 0300) BP: (137-146)/(74-85) (P) 151/75 (07/16 0700) SpO2:  [93 %-98 %] 94 % (07/16 0300) Weight:  [61.8 kg-64.5 kg] 61.8 kg (07/16 0602) Last BM Date : 08/21/23  Intake/Output from previous day: 07/15 0701 - 07/16 0700 In: 2462.2 [P.O.:720; I.V.:1392.2; IV Piggyback:350] Out: 555 [Urine:500; Drains:55] Intake/Output this shift: No intake/output data recorded.  PE: General: Pleasant, WD, female who is laying in bed in NAD. Nursing staff present along with daughter. Lungs: Respiratory effort nonlabored Abd: Soft with mild distention. Non tender to palpation. Colostomy site viable with brown stool present. Midline dressing C/D/I. IR drains present with minimal seropurulent fluid present. Psych: A&Ox3 with an appropriate affect.    Lab Results:  Recent Labs    08/22/23 0400 08/23/23 0349  WBC 13.1* 10.6*  HGB 7.7* 7.4*  HCT 24.9* 23.3*  PLT 462* 445*   BMET Recent Labs    08/22/23 0400 08/23/23 0349  NA 138 138  K 4.2 3.8  CL 103 102  CO2 26 27  GLUCOSE 94 90  BUN 22 22  CREATININE 0.51 0.52  CALCIUM  8.5* 8.1*   PT/INR No results for input(s): LABPROT, INR in the last 72 hours. CMP     Component Value Date/Time   NA 138 08/23/2023 0349   NA 140 11/10/2007 0000   K 3.8 08/23/2023 0349   CL 102 08/23/2023 0349   CO2 27 08/23/2023 0349   GLUCOSE 90 08/23/2023 0349   BUN 22 08/23/2023 0349   BUN 10 11/10/2007 0000    CREATININE 0.52 08/23/2023 0349   CREATININE 0.74 06/26/2020 1522   CALCIUM  8.1 (L) 08/23/2023 0349   PROT 4.8 (L) 08/23/2023 0349   ALBUMIN  1.7 (L) 08/23/2023 0349   AST 18 08/23/2023 0349   ALT 15 08/23/2023 0349   ALKPHOS 75 08/23/2023 0349   BILITOT 0.3 08/23/2023 0349   GFRNONAA >60 08/23/2023 0349   Lipase     Component Value Date/Time   LIPASE <10 (L) 07/30/2023 0936       Studies/Results: No results found.  Anti-infectives: Anti-infectives (From admission, onward)    Start     Dose/Rate Route Frequency Ordered Stop   08/18/23 1800  Ampicillin -Sulbactam (UNASYN ) 3 g in sodium chloride  0.9 % 100 mL IVPB        3 g 200 mL/hr over 30 Minutes Intravenous Every 6 hours 08/18/23 0922     08/06/23 1130  acyclovir  (ZOVIRAX ) 200 MG capsule 400 mg  Status:  Discontinued        400 mg Oral 3 times daily 08/06/23 1037 08/08/23 1424   08/06/23 1100  piperacillin -tazobactam (ZOSYN ) IVPB 3.375 g        3.375 g 12.5 mL/hr over 240 Minutes Intravenous Every 8 hours 08/06/23 1051 08/18/23 1259   07/30/23 1800  piperacillin -tazobactam (ZOSYN ) IVPB 3.375 g        3.375 g 12.5 mL/hr over 240 Minutes Intravenous Every 8 hours 07/30/23 1332 08/04/23 2359   07/30/23 1400  piperacillin -tazobactam (ZOSYN ) IVPB 3.375 g  Status:  Discontinued        3.375 g 100 mL/hr over 30 Minutes Intravenous Every 8 hours 07/30/23 1329 07/30/23 1332   07/30/23 1015  piperacillin -tazobactam (ZOSYN ) IVPB 3.375 g        3.375 g 100 mL/hr over 30 Minutes Intravenous  Once 07/30/23 1013 07/30/23 1311        Assessment/Plan S/p ex lap with Hartmann's procedure for stercoral colitis with ischemia and perforation with feculent peritonitis, Dr. Polly 6/22  - CT scan (7/5) showed collection remains sizable and has slightly enlarged compared with the previous study. This abuts the rectal suture line and a leak in this area cannot be excluded. IR repositioned drains, both purulent. Cx with proteus mirabilis  and enterococcus both sensitive to zosyn . Will plan to have repeat CT AP imaging today.  - WBC 10.6 from 13.1 (7/15) - Hemoglobin 7.4. Stable - Ostomy output noted. - Reported nausea and one episode of vomiting this morning. Will complete KUB. Discussed NGT if continued nausea and vomiting.  - Continue to mobilize as tolerated. - Continue BID dressing changes to midline  - Continue following of WOC - Pulm toilet - BID PPI    FEN: NPO for scan/Increasing TPN due to nausea vomiting. VTE: Lovenox  ID: [email or web address]   - per TRH -  Getting transfusion Nephrolithiasis - per urology,  Chronic constipation - bowel regimen HTN Glaucoma    LOS: 24 days     Marjorie Carlyon Favre, [facility] 08/23/2023, 9:41 AM Please see Amion for pager number during day hours 7:00am-4:30pm

## 2023-08-23 NOTE — Progress Notes (Signed)
 PROGRESS NOTE        PATIENT DETAILS Name: Angela [name] Age: 73 y.o. Sex: female Date of Birth: 08-Oct-1950 Admit Date: 07/30/2023 Admitting Physician Maximino DELENA Sharps, MD ERE:[id number], [name], DO  Brief Summary: Patient is a 73 y.o.  female with history of HTN, HLD who presented with worsening abdominal pain-patient was found to have sigmoid perforation with feculent peritonitis in the setting of stercoral colitis.  Patient underwent sigmoidectomy with end colostomy on 6/22-unfortunately postop course complicated by ileus and pelvic residual abscesses requiring IR drain placement.    Significant events: 6/21>> evaluated in the ED for abdominal pain-found to have left mid ureteral stone-9 mm with hydronephrosis-and large volume of stool-managed with supportive care and discharged home. 6/22>> worsening abdominal pain-repeat CT scan consistent with bowel perforation-surgery consult-admit to [facility]. 6/29>> CT abdomen with pelvic abscess. 6/30>> CT-guided drain and pelvic abscess by IR 7/04>> vomiting-recurrent ileus-NG tube reinserted 7/05>> TNA started-repeat CT abdomen increased size of abscess. 7/07>> IR placed RLQ drain and right transgluteal pelvic drain. 7/10>> NGT removed  Significant studies: 6/22>> CT renal stone study: Interval development of intraperitoneal free air-moderate volume free fluid-consistent with bowel perforation, persistent large volume stool with dilated sigmoid colon.  Similar location of left ureteral stone-9 mm. 6/29>> CT abdomen/pelvis: S/p partial colectomy-LLQ ostomy-complex free fluid with multiple foci of air-most pronounced in the surgical bed-possibility of superimposed infection or bowel leak cannot be excluded. 6/30>> echo: EF 60-65%. 7/05>> CT abdomen/pelvis: Previous collection with pigtail has increased in size, other less well-defined but enlarging peritoneal fluid collections.  Persistent-partially obstructing 11 mm calculus  in the distal left ureter.  Persistent fluid loops of dilated small bowel-likely ileus.  Significant microbiology data: 6/22>> urine culture: No growth 6/22>> blood culture: No growth 6/30>> pelvic abscess culture: Gram-negative rods/gram-positive cocci-mixed anaerobic flora. 7/07>> Pelvic abscess culture:moderate proteus and enterococcus  Procedures: 6/22>> sigmoidectomy with end colostomy.  By general surgeon Dr. Polly 6/30>> CT-guided drain and pelvic abscess by IR 08/19/2023 1.  Cystoscopy with left ureteral stent placement. 2. left retrograde pyelogram with interpretation. 3. Fluoroscopy <1 hour with intraoperative interpretation by urologist Dr. Arlyss Foot  Consults: General Surgery IR Urology  Subjective: Patient in bed, appears comfortable, denies any headache, no fever, no chest pain or pressure, no shortness of breath , no abdominal pain. No focal weakness.   Objective: Vitals: Blood pressure (!) (P) 151/75, pulse 72, temperature 98.4 F (36.9 C), temperature source (P) Oral, resp. rate 11, height 5' 4 (1.626 m), weight 61.8 kg, SpO2 94%.   Exam:  Awake Alert, No new F.N deficits, Normal affect [place].AT,PERRAL Supple Neck, No JVD,   Symmetrical Chest wall movement, Good air movement bilaterally, CTAB RRR,No Gallops, Rubs or new Murmurs,  +ve B.Sounds, Abd Soft, No tenderness,   2 abdominal drains, colostomy bag with brown stool, right arm PICC line,   Assessment/Plan:  Sepsis secondary to stercoral colitis with ischemia/perforation of sigmoid colon with feculent peritonitis-s/p sigmoid colectomy/ostomy on 6/22-complicated by postoperative ileus and then residual pelvic abscess requiring CT-guided drain placement by on 6/30. Ileus seems to be slowly improving-multiple output in ostomy bag overnight.  NG tube has been removed. Remains n.p.o. for ureteral stent placement later today. Remains on Zosyn  >> Unasyn  per General Surgery. General Surgery following-for  management of this problem to general surgery, CT abdomen pelvis being repeated on 08/23/2023. Continues  to be on TNA, clear liquid diet started 08/19/2023.  Once the surgery advances her diet will try and switch off TNA.  Hydronephrosis secondary to distal left ureteral stone Urology following-with recommendations for supportive care for now-now that her medical problems have more or less stabilized underwent left ureteral stent placement by urology Dr. Arlyss Foot on 08/18/2023  Hospital delirium Secondary to acute illness/narcotics Now resolved  Normocytic anemia Due to acute illness,+/- coffee-ground emesis on 7/7 Hemoglobin stable although low, type screen done will continue to monitor transfuse if she drops consistently close to hemoglobin of 7.  Thrombocytosis Likely reactive Supportive care  Coffee-ground NG tube emesis Seems to have resolved-no longer coffee-ground emesis-bile appearing fluid in canister. Continue PPI Hb stable, staying around 7.5, from multiple blood draws and some perioperative blood loss along with him dilution.  Hypomagnesemia/hypophosphatemia/hypokalemia Secondary to NG tube suctioning Resolved.  HTN BP stable-all oral antihypertensives on hold due to n.p.o. status Continue with as needed IV hydralazine  and metoprolol .   Lower extremity edema Secondary to hypoalbuminemia/IV fluid resuscitation in the setting of acute illness Echo stable-UA negative for proteinuria Will start diuretics once she is a bit more stable.  Chronic idiopathic constipation Bowel function recovering-brown stools Once more stable-will need to be placed on a bowel regimen.  Odynophagia secondary to oral/aphthous ulcers Improved on Valtrex and Magic mouthwash Supportive care  Glaucoma Timolol  eyedrops  Insomnia Appears to be primarily due to pain-anxiety Schedule trazodone  at night If no response-use IV Benadryl   Debility/deconditioning PT OT eval-Home health  recommended.  Code status:   Code Status: Full Code   DVT Prophylaxis: enoxaparin  (LOVENOX ) injection 40 mg Start: 08/17/23 1000 Place TED hose Start: 08/09/23 1000 SCDs Start: 07/30/23 1953   Family Communication: Family member bedside on 08/22/2023, 08/23/2023   Disposition Plan: Status is: Inpatient Remains inpatient appropriate because: Severity of illness   Planned Discharge Destination:Home health   Diet: Diet Order             Diet NPO time specified Except for: Sips with Meds  Diet effective midnight                    Data Review:   Inpatient Medications  Scheduled Meds:  acetaminophen   1,000 mg Oral Q6H   bisacodyl   10 mg Rectal Daily   Chlorhexidine  Gluconate Cloth  6 each Topical Daily   enoxaparin  (LOVENOX ) injection  40 mg Subcutaneous Q24H   feeding supplement  237 mL Oral BID BM   ferrous sulfate   325 mg Oral BID WC   folic acid   1 mg Oral Daily   iohexol   500 mL Oral Q1H   latanoprost   1 drop Both Eyes QHS   methocarbamol   500 mg Oral TID   Or   methocarbamol  (ROBAXIN ) injection  500 mg Intravenous TID   multivitamin with minerals  1 tablet Oral Daily   pantoprazole  (PROTONIX ) IV  40 mg Intravenous Q12H   polyethylene glycol  17 g Oral BID   sodium chloride  flush  5 mL Intracatheter Q8H   timolol   1 drop Both Eyes BID   traZODone   50 mg Oral QHS   Continuous Infusions:  ampicillin -sulbactam (UNASYN ) IV 3 g (08/23/23 0556)   TPN ADULT (ION) 35 mL/hr at 08/22/23 1741   PRN Meds:.albuterol , diphenhydrAMINE , hydrALAZINE , HYDROmorphone  (DILAUDID ) injection, melatonin, metoprolol  tartrate, ondansetron  **OR** ondansetron  (ZOFRAN ) IV, mouth rinse, oxyCODONE , phenol, sodium chloride  flush  DVT Prophylaxis  enoxaparin  (LOVENOX ) injection 40 mg Start: 08/17/23 1000 Place  TED hose Start: 08/09/23 1000 SCDs Start: 07/30/23 1953   Recent Labs  Lab 08/19/23 0352 08/20/23 0349 08/21/23 0317 08/22/23 0400 08/23/23 0349  WBC 8.8 10.6* 11.2*  13.1* 10.6*  HGB 7.6* 7.6* 7.3* 7.7* 7.4*  HCT 23.9* 24.0* 23.4* 24.9* 23.3*  PLT 416* 435* 427* 462* 445*  MCV 88.8 89.2 89.0 91.5 91.0  MCH 28.3 28.3 27.8 28.3 28.9  MCHC 31.8 31.7 31.2 30.9 31.8  RDW 15.8* 16.2* 16.5* 16.8* 17.1*  LYMPHSABS 1.7 2.4 1.8 2.0 1.9  MONOABS 0.7 1.0 0.9 0.7 0.6  EOSABS 0.0 0.3 0.2 0.3 0.3  BASOSABS 0.1 0.1 0.1 0.1 0.1    Recent Labs  Lab 08/19/23 0352 08/20/23 0349 08/21/23 0317 08/22/23 0400 08/23/23 0349  NA 136 138 137 138 138  K 3.9 3.8 3.7 4.2 3.8  CL 102 103 104 103 102  CO2 26 28 27 26 27   ANIONGAP 8 7 6 9 9   GLUCOSE 125* 109* 113* 94 90  BUN 18 21 18 22 22   CREATININE 0.55 0.50 0.47 0.51 0.52  AST 15 17 19 17 18   ALT 10 14 14 14 15   ALKPHOS 65 73 84 89 75  BILITOT 0.3 0.4 0.3 0.3 0.3  ALBUMIN  1.6* 1.7* 1.7* 1.9* 1.7*  CRP 2.6* 1.3* 1.0* 1.0*  --   PROCALCITON <0.10 <0.10 <0.10 <0.10 <0.10  MG 1.9 1.8 2.0 1.9 2.0  PHOS 3.7 4.0 3.5 3.4 3.7  CALCIUM  8.1* 8.4* 8.0* 8.5* 8.1*      Recent Labs  Lab 08/19/23 0352 08/20/23 0349 08/21/23 0317 08/22/23 0400 08/23/23 0349  CRP 2.6* 1.3* 1.0* 1.0*  --   PROCALCITON <0.10 <0.10 <0.10 <0.10 <0.10  MG 1.9 1.8 2.0 1.9 2.0  CALCIUM  8.1* 8.4* 8.0* 8.5* 8.1*    --------------------------------------------------------------------------------------------------------------- Lab Results  Component Value Date   CHOL 183 11/30/2021   HDL 62.70 11/30/2021   LDLCALC 101 (H) 11/30/2021   LDLDIRECT 124.0 10/23/2020   TRIG 152 (H) 08/21/2023   CHOLHDL 3 11/30/2021    Lab Results  Component Value Date   HGBA1C 6.0 11/30/2021   No results for input(s): TSH, T4TOTAL, FREET4, T3FREE, THYROIDAB in the last 72 hours. No results for input(s): VITAMINB12, FOLATE, FERRITIN, TIBC, IRON, RETICCTPCT in the last 72 hours. ------------------------------------------------------------------------------------------------------------------ Cardiac Enzymes No results for input(s):  CKMB, TROPONINI, MYOGLOBIN in the last 168 hours.  Invalid input(s): CK  Radiology Reports  No results found.     Signature  -   Lavada Stank M.D on 08/23/2023 at 9:43 AM   -  To page go to [email or web address]

## 2023-08-24 ENCOUNTER — Inpatient Hospital Stay (HOSPITAL_COMMUNITY)

## 2023-08-24 DIAGNOSIS — K631 Perforation of intestine (nontraumatic): Secondary | ICD-10-CM | POA: Diagnosis not present

## 2023-08-24 HISTORY — PX: IR CV LINE INJECTION: IMG2294

## 2023-08-24 LAB — COMPREHENSIVE METABOLIC PANEL WITH GFR
ALT: 15 U/L (ref 0–44)
AST: 14 U/L — ABNORMAL LOW (ref 15–41)
Albumin: 1.6 g/dL — ABNORMAL LOW (ref 3.5–5.0)
Alkaline Phosphatase: 75 U/L (ref 38–126)
Anion gap: 6 (ref 5–15)
BUN: 22 mg/dL (ref 8–23)
CO2: 25 mmol/L (ref 22–32)
Calcium: 7.7 mg/dL — ABNORMAL LOW (ref 8.9–10.3)
Chloride: 105 mmol/L (ref 98–111)
Creatinine, Ser: 0.5 mg/dL (ref 0.44–1.00)
GFR, Estimated: >60 mL/min (ref >60)
Glucose, Bld: 106 mg/dL — ABNORMAL HIGH (ref 70–99)
Potassium: 3.5 mmol/L (ref 3.5–5.1)
Sodium: 136 mmol/L (ref 135–145)
Total Bilirubin: 0.2 mg/dL (ref 0.0–1.2)
Total Protein: 4.7 g/dL — ABNORMAL LOW (ref 6.5–8.1)

## 2023-08-24 LAB — MAGNESIUM: Magnesium: 1.7 mg/dL (ref 1.7–2.4)

## 2023-08-24 LAB — PHOSPHORUS: Phosphorus: 2.8 mg/dL (ref 2.5–4.6)

## 2023-08-24 LAB — GLUCOSE, CAPILLARY
Glucose-Capillary: 103 mg/dL — ABNORMAL HIGH (ref 70–99)
Glucose-Capillary: 99 mg/dL (ref 70–99)

## 2023-08-24 LAB — CBC WITH DIFFERENTIAL/PLATELET
Abs Immature Granulocytes: 0.17 K/uL — ABNORMAL HIGH (ref 0.00–0.07)
Basophils Absolute: 0.1 K/uL (ref 0.0–0.1)
Basophils Relative: 1 %
Eosinophils Absolute: 0.2 K/uL (ref 0.0–0.5)
Eosinophils Relative: 2 %
HCT: 22.7 % — ABNORMAL LOW (ref 36.0–46.0)
Hemoglobin: 7.2 g/dL — ABNORMAL LOW (ref 12.0–15.0)
Immature Granulocytes: 2 %
Lymphocytes Relative: 17 %
Lymphs Abs: 1.7 K/uL (ref 0.7–4.0)
MCH: 28.6 pg (ref 26.0–34.0)
MCHC: 31.7 g/dL (ref 30.0–36.0)
MCV: 90.1 fL (ref 80.0–100.0)
Monocytes Absolute: 0.5 K/uL (ref 0.1–1.0)
Monocytes Relative: 5 %
Neutro Abs: 7.2 K/uL (ref 1.7–7.7)
Neutrophils Relative %: 73 %
Platelets: 443 K/uL — ABNORMAL HIGH (ref 150–400)
RBC: 2.52 MIL/uL — ABNORMAL LOW (ref 3.87–5.11)
RDW: 17.2 % — ABNORMAL HIGH (ref 11.5–15.5)
WBC: 9.8 K/uL (ref 4.0–10.5)
nRBC: 0 % (ref 0.0–0.2)

## 2023-08-24 LAB — PROCALCITONIN: Procalcitonin: <0.1 ng/mL

## 2023-08-24 LAB — C-REACTIVE PROTEIN: CRP: 1.6 mg/dL — ABNORMAL HIGH (ref <1.0)

## 2023-08-24 MED ORDER — IOHEXOL 300 MG/ML  SOLN
50.0000 mL | Freq: Once | INTRAMUSCULAR | Status: AC | PRN
  Administered 2023-08-24: 30 mL

## 2023-08-24 MED ORDER — TRAVASOL 10 % IV SOLN
INTRAVENOUS | Status: AC
  Filled 2023-08-24: qty 890.4

## 2023-08-24 MED ORDER — POTASSIUM CHLORIDE 10 MEQ/100ML IV SOLN
10.0000 meq | INTRAVENOUS | Status: AC
  Administered 2023-08-24 (×3): 10 meq via INTRAVENOUS
  Filled 2023-08-24 (×3): qty 100

## 2023-08-24 MED ORDER — MAGNESIUM SULFATE 2 GM/50ML IV SOLN
2.0000 g | Freq: Once | INTRAVENOUS | Status: AC
  Administered 2023-08-24: 2 g via INTRAVENOUS
  Filled 2023-08-24: qty 50

## 2023-08-24 NOTE — Progress Notes (Signed)
 PROGRESS NOTE        PATIENT DETAILS Name: Angela [name] Age: 73 y.o. Sex: female Date of Birth: [birth year] Admit Date: 07/30/2023 Admitting Physician Maximino DELENA Sharps, MD ERE:[id number], [name], DO  Brief Summary: Patient is a 73 y.o.  female with history of HTN, HLD who presented with worsening abdominal pain-patient was found to have sigmoid perforation with feculent peritonitis in the setting of stercoral colitis.  Patient underwent sigmoidectomy with end colostomy on 6/22-unfortunately postop course complicated by ileus and pelvic residual abscesses requiring IR drain placement.    Significant events: 6/21>> evaluated in the ED for abdominal pain-found to have left mid ureteral stone-9 mm with hydronephrosis-and large volume of stool-managed with supportive care and discharged home. 6/22>> worsening abdominal pain-repeat CT scan consistent with bowel perforation-surgery consult-admit to [facility]. 6/29>> CT abdomen with pelvic abscess. 6/30>> CT-guided drain and pelvic abscess by IR 7/04>> vomiting-recurrent ileus-NG tube reinserted 7/05>> TNA started-repeat CT abdomen increased size of abscess. 7/07>> IR placed RLQ drain and right transgluteal pelvic drain. 7/10>> NGT removed  Significant studies: 6/22>> CT renal stone study: Interval development of intraperitoneal free air-moderate volume free fluid-consistent with bowel perforation, persistent large volume stool with dilated sigmoid colon.  Similar location of left ureteral stone-9 mm. 6/29>> CT abdomen/pelvis: S/p partial colectomy-LLQ ostomy-complex free fluid with multiple foci of air-most pronounced in the surgical bed-possibility of superimposed infection or bowel leak cannot be excluded. 6/30>> echo: EF 60-65%. 7/05>> CT abdomen/pelvis: Previous collection with pigtail has increased in size, other less well-defined but enlarging peritoneal fluid collections.  Persistent-partially obstructing 11 mm calculus  in the distal left ureter.  Persistent fluid loops of dilated small bowel-likely ileus.  Significant microbiology data: 6/22>> urine culture: No growth 6/22>> blood culture: No growth 6/30>> pelvic abscess culture: Gram-negative rods/gram-positive cocci-mixed anaerobic flora. 7/07>> Pelvic abscess culture:moderate proteus and enterococcus  Procedures: 6/22>> sigmoidectomy with end colostomy.  By general surgeon Dr. Polly 6/30>> CT-guided drain and pelvic abscess by IR 08/19/2023 1.  Cystoscopy with left ureteral stent placement. 2. left retrograde pyelogram with interpretation. 3. Fluoroscopy <1 hour with intraoperative interpretation by urologist Dr. Arlyss Foot  Consults: General Surgery IR Urology  Subjective: Seen in bed, denies any headache or chest pain, has abdominal bloating and nausea, had some episodes of emesis yesterday   Objective: Vitals: Blood pressure 133/71, pulse 80, temperature 97.9 F (36.6 C), temperature source Oral, resp. rate 13, height 5' 4 (1.626 m), weight 58.2 kg, SpO2 94%.   Exam:  Awake Alert, No new F.N deficits, Normal affect [place].AT,PERRAL Supple Neck, No JVD,   Symmetrical Chest wall movement, Good air movement bilaterally, CTAB RRR,No Gallops, Rubs or new Murmurs,  Abdomen is more distended on 08/24/2023. 2 abdominal drains, colostomy bag with brown stool, right arm PICC line,   Assessment/Plan:  Sepsis secondary to stercoral colitis with ischemia/perforation of sigmoid colon with feculent peritonitis-s/p sigmoid colectomy/ostomy on 6/22-complicated by postoperative ileus and then residual pelvic abscess requiring CT-guided drain placement by on 6/30. Ileus seems to be slowly improving-multiple output in ostomy bag overnight.  NG tube has been removed. Remains n.p.o. for ureteral stent placement later today. Remains on Zosyn  >> Unasyn  per General Surgery. General Surgery following-for management of this problem to general surgery, CT  abdomen pelvis being repeated on 08/23/2023 suggestive of small bowel obstruction, continue TNA, general surgery following defer management  to general surgery  Hydronephrosis secondary to distal left ureteral stone Urology following-with recommendations for supportive care for now-now that her medical problems have more or less stabilized underwent left ureteral stent placement by urology Dr. Arlyss Foot on 08/18/2023  Hospital delirium Secondary to acute illness/narcotics Now resolved  Normocytic anemia Due to acute illness,+/- coffee-ground emesis on 7/7 Hemoglobin stable although low, type screen done will continue to monitor transfuse if she drops consistently close to hemoglobin of 7.  Thrombocytosis Likely reactive Supportive care  Coffee-ground NG tube emesis Seems to have resolved-no longer coffee-ground emesis-bile appearing fluid in canister. Continue PPI Hb stable, staying around 7.5, from multiple blood draws and some perioperative blood loss along with him dilution.  Hypomagnesemia/hypophosphatemia/hypokalemia Secondary to NG tube suctioning Resolved.  HTN BP stable-all oral antihypertensives on hold due to n.p.o. status Continue with as needed IV hydralazine  and metoprolol .   Lower extremity edema Secondary to hypoalbuminemia/IV fluid resuscitation in the setting of acute illness Echo stable-UA negative for proteinuria Will start diuretics once she is a bit more stable.  Chronic idiopathic constipation Bowel function recovering-brown stools Once more stable-will need to be placed on a bowel regimen.  Odynophagia secondary to oral/aphthous ulcers Improved on Valtrex and Magic mouthwash Supportive care  Glaucoma Timolol  eyedrops  Insomnia Appears to be primarily due to pain-anxiety Schedule trazodone  at night If no response-use IV Benadryl   Debility/deconditioning PT OT eval-Home health recommended.  Code status:   Code Status: Full Code   DVT  Prophylaxis: enoxaparin  (LOVENOX ) injection 40 mg Start: 08/17/23 1000 Place TED hose Start: 08/09/23 1000 SCDs Start: 07/30/23 1953   Family Communication: Family member bedside on 08/22/2023, 08/23/2023   Disposition Plan: Status is: Inpatient Remains inpatient appropriate because: Severity of illness   Planned Discharge Destination:Home health   Diet: Diet Order             Diet NPO time specified Except for: Sips with Meds, Ice Chips  Diet effective midnight                    Data Review:   Inpatient Medications  Scheduled Meds:  acetaminophen   1,000 mg Oral Q6H   bisacodyl   10 mg Rectal Daily   Chlorhexidine  Gluconate Cloth  6 each Topical Daily   enoxaparin  (LOVENOX ) injection  40 mg Subcutaneous Q24H   feeding supplement  237 mL Oral BID BM   ferrous sulfate   325 mg Oral BID WC   folic acid   1 mg Oral Daily   latanoprost   1 drop Both Eyes QHS   methocarbamol   500 mg Oral TID   Or   methocarbamol  (ROBAXIN ) injection  500 mg Intravenous TID   multivitamin with minerals  1 tablet Oral Daily   pantoprazole  (PROTONIX ) IV  40 mg Intravenous Q12H   polyethylene glycol  17 g Oral BID   sodium chloride  flush  5 mL Intracatheter Q8H   timolol   1 drop Both Eyes BID   traZODone   50 mg Oral QHS   Continuous Infusions:  ampicillin -sulbactam (UNASYN ) IV 3 g (08/24/23 0550)   TPN ADULT (ION) 70 mL/hr at 08/23/23 1755   PRN Meds:.albuterol , diphenhydrAMINE , hydrALAZINE , HYDROmorphone  (DILAUDID ) injection, melatonin, metoprolol  tartrate, ondansetron  **OR** ondansetron  (ZOFRAN ) IV, mouth rinse, oxyCODONE , phenol, sodium chloride  flush  DVT Prophylaxis  enoxaparin  (LOVENOX ) injection 40 mg Start: 08/17/23 1000 Place TED hose Start: 08/09/23 1000 SCDs Start: 07/30/23 1953   Recent Labs  Lab 08/20/23 0349 08/21/23 0317 08/22/23 0400 08/23/23 0349 08/24/23 0442  WBC 10.6* 11.2* 13.1* 10.6* 9.8  HGB 7.6* 7.3* 7.7* 7.4* 7.2*  HCT 24.0* 23.4* 24.9* 23.3* 22.7*   PLT 435* 427* 462* 445* 443*  MCV 89.2 89.0 91.5 91.0 90.1  MCH 28.3 27.8 28.3 28.9 28.6  MCHC 31.7 31.2 30.9 31.8 31.7  RDW 16.2* 16.5* 16.8* 17.1* 17.2*  LYMPHSABS 2.4 1.8 2.0 1.9 1.7  MONOABS 1.0 0.9 0.7 0.6 0.5  EOSABS 0.3 0.2 0.3 0.3 0.2  BASOSABS 0.1 0.1 0.1 0.1 0.1    Recent Labs  Lab 08/19/23 0352 08/20/23 0349 08/21/23 0317 08/22/23 0400 08/23/23 0349 08/24/23 0442  NA 136 138 137 138 138 136  K 3.9 3.8 3.7 4.2 3.8 3.5  CL 102 103 104 103 102 105  CO2 26 28 27 26 27 25   ANIONGAP 8 7 6 9 9 6   GLUCOSE 125* 109* 113* 94 90 106*  BUN 18 21 18 22 22 22   CREATININE 0.55 0.50 0.47 0.51 0.52 0.50  AST 15 17 19 17 18  14*  ALT 10 14 14 14 15 15   ALKPHOS 65 73 84 89 75 75  BILITOT 0.3 0.4 0.3 0.3 0.3 0.2  ALBUMIN  1.6* 1.7* 1.7* 1.9* 1.7* 1.6*  CRP 2.6* 1.3* 1.0* 1.0*  --  1.6*  PROCALCITON <0.10 <0.10 <0.10 <0.10 <0.10 <0.10  MG 1.9 1.8 2.0 1.9 2.0 1.7  PHOS 3.7 4.0 3.5 3.4 3.7 2.8  CALCIUM  8.1* 8.4* 8.0* 8.5* 8.1* 7.7*      Recent Labs  Lab 08/19/23 0352 08/20/23 0349 08/21/23 0317 08/22/23 0400 08/23/23 0349 08/24/23 0442  CRP 2.6* 1.3* 1.0* 1.0*  --  1.6*  PROCALCITON <0.10 <0.10 <0.10 <0.10 <0.10 <0.10  MG 1.9 1.8 2.0 1.9 2.0 1.7  CALCIUM  8.1* 8.4* 8.0* 8.5* 8.1* 7.7*    --------------------------------------------------------------------------------------------------------------- Lab Results  Component Value Date   CHOL 183 11/30/2021   HDL 62.70 11/30/2021   LDLCALC 101 (H) 11/30/2021   LDLDIRECT 124.0 10/23/2020   TRIG 152 (H) 08/21/2023   CHOLHDL 3 11/30/2021    Lab Results  Component Value Date   HGBA1C 6.0 11/30/2021   No results for input(s): TSH, T4TOTAL, FREET4, T3FREE, THYROIDAB in the last 72 hours. No results for input(s): VITAMINB12, FOLATE, FERRITIN, TIBC, IRON, RETICCTPCT in the last 72  hours. ------------------------------------------------------------------------------------------------------------------ Cardiac Enzymes No results for input(s): CKMB, TROPONINI, MYOGLOBIN in the last 168 hours.  Invalid input(s): CK  Radiology Reports  DG Abd Portable 1V Result Date: 08/24/2023 CLINICAL DATA:  Evaluate small bowel obstruction. History of bowel perforation. EXAM: PORTABLE ABDOMEN - 1 VIEW COMPARISON:  08/23/2023 FINDINGS: There are 2 pigtail drainage catheters within the right paramidline pelvis. Left-sided nephroureteral stent is in place. Persistent dilated small bowel loops are identified within the left hemiabdomen which appears similar to the previous exam measuring up to 4 cm. IMPRESSION: 1. Persistent dilated small bowel loops within the left hemiabdomen compatible with small bowel obstruction. Electronically Signed   By: Waddell Calk M.D.   On: 08/24/2023 06:55   CT ABDOMEN PELVIS W CONTRAST Result Date: 08/23/2023 CLINICAL DATA:  Postoperative abdominal pain. Exploratory laparotomy with Hartmann's procedure. EXAM: CT ABDOMEN AND PELVIS WITH CONTRAST TECHNIQUE: Multidetector CT imaging of the abdomen and pelvis was performed using the standard protocol following bolus administration of intravenous contrast. RADIATION DOSE REDUCTION: This exam was performed according to the departmental dose-optimization program which includes automated exposure control, adjustment of the mA and/or kV according to patient size and/or use of iterative reconstruction technique. CONTRAST:  75mL OMNIPAQUE   IOHEXOL  350 MG/ML SOLN COMPARISON:  CT abdomen and pelvis 08/12/2023 FINDINGS: Lower chest: There is atelectasis in the lung bases. Hepatobiliary: There is a cyst in the left lobe of the liver which appears unchanged measuring 15 mm. No new liver lesions are seen. Gallbladder and bile ducts are within normal limits. Pancreas: Unremarkable. No pancreatic ductal dilatation or surrounding  inflammatory changes. Spleen: Normal in size without focal abnormality. Adrenals/Urinary Tract: Left ureteral stent in place. There is no hydronephrosis in either kidney. There are rounded hypodensities in both kidneys which are too small to characterize, likely cysts. There is no perinephric fat stranding. Adrenal glands are within normal limits. There is a small amount of air in the bladder. Stomach/Bowel: There are dilated small bowel loops throughout the mid abdomen with air-fluid levels. The stomach is also dilated with large air-fluid level. Transition point is seen in the right lower quadrant adjacent to the anterior abdominal wall image 2/60. Distal small bowel and colon are nondilated. Left lower quadrant colostomy is present. The appendix is within normal limits. Air-fluid levels are also scattered throughout the colon. Vascular/Lymphatic: Aorta and IVC are normal in size. There are atherosclerotic calcifications of the aorta. No enlarged lymph nodes are seen. Reproductive: Uterus and bilateral adnexa are unremarkable. Other: There is no ascites.  There is presacral edema. There is a percutaneous drainage catheter in the anterior right pelvis. There is no associated fluid collection at the end of this catheter. Posterior sacroiliac approach percutaneous catheter is seen ending in the cul-de-sac. There is no definite fluid collection at the tip of the catheter; however, there is enhancing fluid collection just superior to this catheter containing air in the presacral region measuring 5.8 by 2.3 x 1.2 cm. Enhancing fluid collection containing air in the lateral left and anterior anterior pelvis has significantly decreased in size. The largest portion of the collection is now seen on the left image 2/56 measuring 1.4 by 5.0 cm. No free intraperitoneal air. Musculoskeletal: Degenerative changes affect the spine. IMPRESSION: 1. Small-bowel obstruction with transition point in the right lower quadrant. 2. Left  lower quadrant colostomy. 3. Percutaneous drainage catheter in the anterior right pelvis without associated fluid collection. 4. Percutaneous drainage catheter in the posterior sacroiliac region ending in the cul-de-sac. There is no definite fluid collection at the tip of the catheter; however, there is a enhancing fluid collection containing air in the presacral region measuring 5.8 x 2.3 x 1.2 cm. 5. Enhancing fluid collection containing air in the lateral left and anterior pelvis has significantly decreased in size. The largest portion of the collection is now seen on the left measuring 1.4 x 5.0 cm. 6. Left ureteral stent in place. No hydronephrosis. 7. Small amount of air in the bladder, possibly related to recent instrumentation. Aortic Atherosclerosis (ICD10-I70.0). Electronically Signed   By: Greig Pique M.D.   On: 08/23/2023 17:57   DG Abd Portable 1V Result Date: 08/23/2023 CLINICAL DATA:  Nausea and vomiting EXAM: PORTABLE ABDOMEN - 1 VIEW COMPARISON:  X-ray performed August 12, 2023 FINDINGS: Diffuse gaseous distention of bowel is present with the most distended loops present in the left abdomen, similar. There is been interval placement of an internalized left-sided nephroureteral stent. Additionally, right-sided drainage catheter material has been revised. Degenerative changes are present in the imaged osseous structures. IMPRESSION: 1. Diffuse gaseous distension of the bowel, similar when compared to the prior exam. Electronically Signed   By: Maude Naegeli M.D.   On: 08/23/2023 10:12  Signature  -   Lavada Stank M.D on 08/24/2023 at 10:01 AM   -  To page go to [email or web address]

## 2023-08-24 NOTE — Progress Notes (Signed)
 Referring Physician(s): Dr. Ann  Supervising Physician: Hughes Simmonds  Patient Status:  [facility] - In-pt  Chief Complaint:  S/p R TG 12Fr and RLQ 12 Fr drains placed 7/7 by Dr. Vanice.   Subjective:  Pt is lying in bed. NG tube needed to be replaced since last visit. Denies fever, chills. Endorses continued abd discomfort.   Allergies: Codeine  Medications: Prior to Admission medications   Medication Sig Start Date End Date Taking? Authorizing Provider  albuterol  (VENTOLIN  HFA) 108 (90 Base) MCG/ACT inhaler Inhale 2 puffs into the lungs every 6 (six) hours as needed for wheezing or shortness of breath. 03/03/22  Yes Kuneff, Renee A, DO  Calcium  Polycarbophil (FIBER-CAPS PO) Take 1 capsule by mouth daily at 12 noon.   Yes [provider]  HYDROcodone -acetaminophen  (NORCO/VICODIN) 5-325 MG tablet Take 1 tablet by mouth every 6 (six) hours as needed for moderate pain (pain score 4-6). 07/29/23  Yes Zackowski, Scott, MD  hydrocortisone  (ANUSOL -HC) 25 MG suppository Place 1 suppository (25 mg total) rectally 2 (two) times daily. 06/23/23  Yes Kuneff, Renee A, DO  hydrocortisone  cream 1 % Apply 1 Application topically 2 (two) times daily. 06/08/23  Yes Kuneff, Renee A, DO  latanoprost  (XALATAN ) 0.005 % ophthalmic solution Place 1 drop into both eyes at bedtime. 08/11/17  Yes [provider]  LINZESS 72 MCG capsule Take 72 mcg by mouth daily before breakfast. 06/26/23  Yes [provider]  lisinopril  (ZESTRIL ) 10 MG tablet TAKE 1 TABLET BY MOUTH DAILY 05/22/23  Yes Kuneff, Renee A, DO  Multiple Vitamin (MULTIVITAMIN) tablet Take 1 tablet by mouth daily.   Yes [provider]  ondansetron  (ZOFRAN -ODT) 4 MG disintegrating tablet Take 1 tablet (4 mg total) by mouth every 8 (eight) hours as needed for nausea or vomiting. 07/29/23  Yes Zackowski, Scott, MD  Probiotic Product (DAILY PROBIOTIC PO) Take 1 capsule by mouth daily at 12 noon.   Yes [provider]   timolol  (TIMOPTIC ) 0.5 % ophthalmic solution Place 1 drop into both eyes 2 (two) times daily. 11/14/19  Yes [provider]  amoxicillin -clavulanate (AUGMENTIN ) 875-125 MG tablet Take 1 tablet by mouth every 12 (twelve) hours. Patient not taking: Reported on 07/31/2023 07/29/23   Zackowski, Scott, MD  atorvastatin  (LIPITOR) 20 MG tablet Take 1 tablet (20 mg total) by mouth at bedtime. Patient not taking: Reported on 02/22/2023 11/30/21   Catherine Fuller A, DO     Vital Signs: BP 132/64 (BP Location: Left Arm)   Pulse 73   Temp 97.9 F (36.6 C) (Oral)   Resp 14   Ht 5' 4 (1.626 m)   Wt 128 lb 4.9 oz (58.2 kg)   SpO2 93%   BMI 22.02 kg/m   Physical Exam Cardiovascular:     Rate and Rhythm: Normal rate.  Pulmonary:     Effort: Pulmonary effort is normal.  Abdominal:     Palpations: Abdomen is soft.     Comments: Midline surgical wound and ostomy present. RLQ drain and TG drain present, unremarkable insertion site. R TG 15 mL tan purulent drainage in well charged bulb. RLQ serousanguinous output in bulb (<38mL)   Neurological:     Mental Status: She is alert.   Imaging: DG Abd Portable 1V Result Date: 08/24/2023 CLINICAL DATA:  [id number] Encounter for nasogastric (NG) tube placement [id number] EXAM: PORTABLE ABDOMEN - 1 VIEW COMPARISON:  None Available. FINDINGS: Esophagogastric tube terminates at the GE junction. Nonobstructive bowel gas pattern. No pneumoperitoneum. Partially  visualized left ureteral stent. No acute fracture or destructive lesion. The lung bases are clear. IMPRESSION: Esophagogastric tube terminates in the region of the GE junction. The last side hole is in the distal esophagus and continued advancement is recommended. Electronically Signed   By: Rogelia Myers M.D.   On: 08/24/2023 11:48   DG Abd Portable 1V Result Date: 08/24/2023 CLINICAL DATA:  Evaluate small bowel obstruction. History of bowel perforation. EXAM: PORTABLE ABDOMEN - 1 VIEW COMPARISON:  08/23/2023  FINDINGS: There are 2 pigtail drainage catheters within the right paramidline pelvis. Left-sided nephroureteral stent is in place. Persistent dilated small bowel loops are identified within the left hemiabdomen which appears similar to the previous exam measuring up to 4 cm. IMPRESSION: 1. Persistent dilated small bowel loops within the left hemiabdomen compatible with small bowel obstruction. Electronically Signed   By: Waddell Calk M.D.   On: 08/24/2023 06:55   CT ABDOMEN PELVIS W CONTRAST Result Date: 08/23/2023 CLINICAL DATA:  Postoperative abdominal pain. Exploratory laparotomy with Hartmann's procedure. EXAM: CT ABDOMEN AND PELVIS WITH CONTRAST TECHNIQUE: Multidetector CT imaging of the abdomen and pelvis was performed using the standard protocol following bolus administration of intravenous contrast. RADIATION DOSE REDUCTION: This exam was performed according to the departmental dose-optimization program which includes automated exposure control, adjustment of the mA and/or kV according to patient size and/or use of iterative reconstruction technique. CONTRAST:  75mL OMNIPAQUE  IOHEXOL  350 MG/ML SOLN COMPARISON:  CT abdomen and pelvis 08/12/2023 FINDINGS: Lower chest: There is atelectasis in the lung bases. Hepatobiliary: There is a cyst in the left lobe of the liver which appears unchanged measuring 15 mm. No new liver lesions are seen. Gallbladder and bile ducts are within normal limits. Pancreas: Unremarkable. No pancreatic ductal dilatation or surrounding inflammatory changes. Spleen: Normal in size without focal abnormality. Adrenals/Urinary Tract: Left ureteral stent in place. There is no hydronephrosis in either kidney. There are rounded hypodensities in both kidneys which are too small to characterize, likely cysts. There is no perinephric fat stranding. Adrenal glands are within normal limits. There is a small amount of air in the bladder. Stomach/Bowel: There are dilated small bowel loops  throughout the mid abdomen with air-fluid levels. The stomach is also dilated with large air-fluid level. Transition point is seen in the right lower quadrant adjacent to the anterior abdominal wall image 2/60. Distal small bowel and colon are nondilated. Left lower quadrant colostomy is present. The appendix is within normal limits. Air-fluid levels are also scattered throughout the colon. Vascular/Lymphatic: Aorta and IVC are normal in size. There are atherosclerotic calcifications of the aorta. No enlarged lymph nodes are seen. Reproductive: Uterus and bilateral adnexa are unremarkable. Other: There is no ascites.  There is presacral edema. There is a percutaneous drainage catheter in the anterior right pelvis. There is no associated fluid collection at the end of this catheter. Posterior sacroiliac approach percutaneous catheter is seen ending in the cul-de-sac. There is no definite fluid collection at the tip of the catheter; however, there is enhancing fluid collection just superior to this catheter containing air in the presacral region measuring 5.8 by 2.3 x 1.2 cm. Enhancing fluid collection containing air in the lateral left and anterior anterior pelvis has significantly decreased in size. The largest portion of the collection is now seen on the left image 2/56 measuring 1.4 by 5.0 cm. No free intraperitoneal air. Musculoskeletal: Degenerative changes affect the spine. IMPRESSION: 1. Small-bowel obstruction with transition point in the right lower quadrant. 2. Left  lower quadrant colostomy. 3. Percutaneous drainage catheter in the anterior right pelvis without associated fluid collection. 4. Percutaneous drainage catheter in the posterior sacroiliac region ending in the cul-de-sac. There is no definite fluid collection at the tip of the catheter; however, there is a enhancing fluid collection containing air in the presacral region measuring 5.8 x 2.3 x 1.2 cm. 5. Enhancing fluid collection containing air  in the lateral left and anterior pelvis has significantly decreased in size. The largest portion of the collection is now seen on the left measuring 1.4 x 5.0 cm. 6. Left ureteral stent in place. No hydronephrosis. 7. Small amount of air in the bladder, possibly related to recent instrumentation. Aortic Atherosclerosis (ICD10-I70.0). Electronically Signed   By: Greig Pique M.D.   On: 08/23/2023 17:57   DG Abd Portable 1V Result Date: 08/23/2023 CLINICAL DATA:  Nausea and vomiting EXAM: PORTABLE ABDOMEN - 1 VIEW COMPARISON:  X-ray performed August 12, 2023 FINDINGS: Diffuse gaseous distention of bowel is present with the most distended loops present in the left abdomen, similar. There is been interval placement of an internalized left-sided nephroureteral stent. Additionally, right-sided drainage catheter material has been revised. Degenerative changes are present in the imaged osseous structures. IMPRESSION: 1. Diffuse gaseous distension of the bowel, similar when compared to the prior exam. Electronically Signed   By: Maude Naegeli M.D.   On: 08/23/2023 10:12    Labs:  CBC: Recent Labs    08/21/23 0317 08/22/23 0400 08/23/23 0349 08/24/23 0442  WBC 11.2* 13.1* 10.6* 9.8  HGB 7.3* 7.7* 7.4* 7.2*  HCT 23.4* 24.9* 23.3* 22.7*  PLT 427* 462* 445* 443*    COAGS: Recent Labs    07/29/23 0826 08/07/23 0909 08/15/23 0431  INR 1.1 1.2 1.1    BMP: Recent Labs    08/21/23 0317 08/22/23 0400 08/23/23 0349 08/24/23 0442  NA 137 138 138 136  K 3.7 4.2 3.8 3.5  CL 104 103 102 105  CO2 27 26 27 25   GLUCOSE 113* 94 90 106*  BUN 18 22 22 22   CALCIUM  8.0* 8.5* 8.1* 7.7*  CREATININE 0.47 0.51 0.52 0.50  GFRNONAA >60 >60 >60 >60    LIVER FUNCTION TESTS: Recent Labs    08/21/23 0317 08/22/23 0400 08/23/23 0349 08/24/23 0442  BILITOT 0.3 0.3 0.3 0.2  AST 19 17 18  14*  ALT 14 14 15 15   ALKPHOS 84 89 75 75  PROT 5.0* 5.4* 4.8* 4.7*  ALBUMIN  1.7* 1.9* 1.7* 1.6*    Assessment and  Plan:  Output by Drain (mL) 08/22/23 0701 - 08/22/23 1900 08/22/23 1901 - 08/23/23 0700 08/23/23 0701 - 08/23/23 1900 08/23/23 1901 - 08/24/23 0700 08/24/23 0701 - 08/24/23 1522  Closed System Drain Right RLQ Bulb (JP) 12 Fr.  10 15    Closed System Drain Right Buttock Bulb (JP) 12 Fr.  45 10      CT 7/15 reviewed by Dr. Hughes. Plan to bring patient down to IR for RLQ drain removal and R TG drain injection. Will make additional planning with R TG based on findings. Patient aware of this plan, will occur this afternoon or tomorrow schedule allowing.   Continue TID flushes with 5 cc NS. Record output Q shift. Dressing changes QD or PRN if soiled.      Call IR APP or on call IR MD if difficulty flushing or sudden change in drain output.    Discharge planning: Please contact IR APP or on call IR MD prior  to patient d/c to ensure appropriate follow up plans are in place.    Electronically Signed: Laymon Coast, NP 08/24/2023, 3:18 PM   I spent a total of 15 Minutes at the the patient's bedside AND on the patient's hospital floor or unit, greater than 50% of which was counseling/coordinating care for maintenance of x2 abd fluid collect drains.

## 2023-08-24 NOTE — Progress Notes (Signed)
 Nutrition Follow-up  DOCUMENTATION CODES:   Severe malnutrition in context of acute illness/injury  INTERVENTION:  Monitor for diet advancement and tolerance   Continue Magic cup TID with meals when diet resumed   Continue Ensure Plus High Protein po BID when diet resumed   Resume oral MVI w/ minerals when PO diet resumed   TPN to meet estimated nutritional needs; TPN order per Pharmacy Daily weights  MVI added to TPN  TPN providing: 1660 kcals and 89g protein (100% of estimated needs)   NUTRITION DIAGNOSIS:  Severe Malnutrition related to acute illness (stercoral colitis w/ ischemia and performation w/ feculent peritonitis) as evidenced by mild fat depletion, moderate fat depletion. - remains applicable  GOAL:  Patient will meet greater than or equal to 90% of their needs - being met via TPN  MONITOR:  TF tolerance, I & O's, Diet advancement, Labs  REASON FOR ASSESSMENT:  Consult New TPN/TNA  ASSESSMENT:  73 yo female with stercoral colitis admitted on 6/22 with ischemia and perforation with feculent peritonitis requiring sigmoidectomy with end colostomy. Pt seen in ED on 6/21 with CT indicating colitis with large stool burden-discharged on antibiotics. Post-op course complicated by pelvic abscess and post-op ileus, inability to tolerate po. PMH includes chronic idiopathic constipation, HLD, HTN  6/22 Admitted, Ex Lap, open sigmoid colectomy with colostomy for stercoral colitis with ischemia and perforation with feculent peritonitis 6/23 NPO with ice chips 6/25 CL diet initiated 6/28 Advanced to [facility] diet 6/29 Back to NPO, CT A/P: complex free fluid with multiple foci of air, possible infection or bowel leak; NGT re-placed 6/30 CT-guided drainage of pelvic abscess by IR, drain left in place, ECHO EF 60-65% 7/01 Started back on CL diet, NGT removed 7/02 Advanced to [facility] Diet 7/03 Diet downgraded to CL  due to abdominal distention 7/04 Not tolerating CL diet, NGT (placed) to  LIS, NPO 7/05 Abd xray with persistent dilated loops of small bowel within the left hemiabdomen measure up to 4.3 cm. PICC line placement with TPN initiation 7/07 Pelvic abscess culture: moderate proteus and enterococcus; IR removed old TG drain and replaced with two drains ( R TG 12Fr and RLQ 12 Fr) 7/07 CT-guided placement of abdominal abscess drain to RLQ - yield  7/08 TPN at goal rate 7/10 NGT removed 7/11 advanced to clear liquid diet; NGT replaced 7/12 cytoscopy w/ L ureteral stent placement; L retrograde pyelogram 7/13 NGT removed 7/14 advanced to full liquid diet; transfused 1 unit PRBC 7/16 ureteral stent placement 7/17 abd XR: SBO; NGT re-placed  Multiple episodes of emesis yesterday. CT of abdomen on 7/16 suggestive of small bowel obstruction. Abdomen more distended today.  Originally slated to stop TPN today, however will continue due to change in status. General surgery managing. TPN continues at goal rate and patient remains NPO.    Admit Weight: 57.2 kg  Current Weight: 58.2 kg Lowest Weight: 57.2 kg on admission +generalized, mild pitting to BLEs   Weight stable. She is noted with edema to BLEs. Will continue to monitor trend as edema resolves to assess true weight trend and update estimated needs as indicated. Stomal suppositories continue.    Drains/Lines: NGT (gastric) placed 7/17  R basilic: PICC (placed 7/05) RLQ: JP drain (12 Fr): 15ml x24 hours LLQ: colostomy: x24 hours - ?accuracy R buttock: JP drain (12 Fr): 10ml x24 hours L ureter (6 Fr): 30ml x24 hours UOP: + 5 unmeasured occurrences x24 hours   Refeeding labs have stabilized. Requiring potassium  and magnesium  supplementation today. MVI added back to TPN. Sodium, phosphorus and potassium provision increased in TPN today.    Labs: Sodium 136 (wdl)  Potassium 3.5 (wdl) Magnesium  1.7 (wdl) Phosphorus 2.8 (wdl) CRP 1.6 (H) Hgb 7.7>7.4>7.2 (L) CBGs 90-106 x24 hours   Meds:   Dulcolax Ferrous sulfate  Folic acid  Pantoprazole  Miralax  IV ABX  10 mEq IV KCl x4 2g Mg sulfate x1   Diet Order:   Diet Order             Diet NPO time specified Except for: Sips with Meds, Ice Chips  Diet effective midnight            EDUCATION NEEDS:  Education needs have been addressed  Skin:  Skin Assessment: Skin Integrity Issues: Skin Integrity Issues:: Incisions Incisions: abdomen (closed)  Last BM:  7/13 via ostomy - no output documented  Height:  Ht Readings from Last 1 Encounters:  08/18/23 5' 4 (1.626 m)   Weight:  Wt Readings from Last 1 Encounters:  08/24/23 58.2 kg   Ideal Body Weight:  54.55 kg  BMI:  Body mass index is 22.02 kg/m.  Estimated Nutritional Needs:   Kcal:  1650-1850 kcals  Protein:  80-90 g  Fluid:  >/= 1.7 L  Blair Deaner MS, RD, LDN Registered Dietitian Clinical Nutrition RD Inpatient Contact Info in Amion

## 2023-08-24 NOTE — Plan of Care (Signed)
  Problem: Education: Goal: Knowledge of General Education information will improve Description: Including pain rating scale, medication(s)/side effects and non-pharmacologic comfort measures Outcome: Progressing   Problem: Clinical Measurements: Goal: Ability to maintain clinical measurements within normal limits will improve Outcome: Progressing   Problem: Nutrition: Goal: Adequate nutrition will be maintained Outcome: Progressing   Problem: Activity: Goal: Risk for activity intolerance will decrease Outcome: Progressing   Problem: Coping: Goal: Level of anxiety will decrease Outcome: Progressing   Problem: Elimination: Goal: Will not experience complications related to bowel motility Outcome: Progressing

## 2023-08-24 NOTE — Progress Notes (Signed)
 PHARMACY - TOTAL PARENTERAL NUTRITION CONSULT NOTE  Indication: intolerance to feeding, recurrent ileus  Patient Measurements: Height: 5' 4 (162.6 cm) Weight: 58.2 kg (128 lb 4.9 oz) IBW/kg (Calculated) : 54.7 TPN AdjBW (KG): 61.2 Body mass index is 22.02 kg/m. Usual Weight: ~59 kg  Assessment:  72 YOF s/p exp lap with Hartmann's procedure for stercoral colitis with ischemia and perforation with feculent peritonitis on 6/22. Noted pt has been NPO/clear liquid/full liquid diet since after surgery with limited intake. Pt now with ileus on Xray (dilated loops of small bowel) and N/V with liquid diet so NGT replaced 7/4, now NPO and starting TPN. Pt at risk of refeeding with prolonged period of little po intake since admission.  Glucose / Insulin : no hx DM - CBGs < 180, off insulin  Electrolytes: K 3.5 down (goal >/= 4 for ileus), Mag 1.7 (goal >/= 2 for ileus), others WNL Renal: SCr < 1, BUN WNL Hepatic: LFTs / tbili WNL, albumin  1.6, TG mildly elevated at 152 (7/13) Intake / Output; MIVF: UOP not reliably documented, drain 25 mL, colostomy 0mL, stool 250 mL GI Imaging: 7/5 abd xray - persistent dilated loops of small bowel 7/5 CT - pelvic fluid collection slightly enlarged, suspicious for peritonitis, partially obstructing calculus in L ureter, ileus 7/16 CT small bowel obstruction 7/17 abd XR - SBO GI Surgeries / Procedures:  6/22 ex-lap with Hartmann's procedure for stercoral colitis with ischemia and perf with feculent peritonitis 7/8 CT-guided RLQ abd abscess and transgluteal pelvic drains [>459mL tan colored fluid] 7/10 NGT removed  7/11 NGT replaced  7/16 ureteral stent placement   Central access: PICC placed 08/12/23 TPN start date: 08/12/23  Nutritional Goals: Goal TPN 70 ml/hr will provide 89g AA, 1660 kCal   RD Estimated Needs Total Energy Estimated Needs: 1650-1850 kcals Total Protein Estimated Needs: 80-90 g Total Fluid Estimated Needs: >/= 1.7 L  Current Nutrition:   TPN  Plan:  Continue TPN rate at 70 ml/hr to provide 100% of need Electrolytes in TPN: increase Na 140 mEq/L, increase K to 70 mEq/L, Ca 2 mEq/L, Mg 15 mEq/L (max), increase Phos slightly to 9 mmol/L, Cl:Ac 1:1  Add multivitamin trace elements to TPN  KCL x 3 runs  Mag 2gm IV x 1 Monitor TPN labs on Mon/Thurs - labs in AM  Angela [name], PharmD, BCPS, BCCCP 08/24/2023, 11:03 AM

## 2023-08-24 NOTE — Progress Notes (Signed)
 Patient ID: Angela [name], female   DOB: [birth year], 73 y.o.   MRN: [id number] 6 Days Post-Op    Subjective: Vomited multiple times ROS negative except as listed above. Objective: Vital signs in last 24 hours: Temp:  [97.6 F (36.4 C)-98.2 F (36.8 C)] 97.9 F (36.6 C) (07/17 0300) BP: (127-146)/(70-96) 127/70 (07/17 0300) SpO2:  [94 %] 94 % (07/16 2200) Weight:  [58.2 kg] 58.2 kg (07/17 0500) Last BM Date : 08/23/23  Intake/Output from previous day: 07/16 0701 - 07/17 0700 In: -  Out: 1205 [Urine:930; Drains:25; Stool:250] Intake/Output this shift: Total I/O In: -  Out: 450 [Urine:450]  General appearance: alert and cooperative GI: soft, wound dressed, a lot of stool in bag, drains purulent  Lab Results: CBC  Recent Labs    08/23/23 0349 08/24/23 0442  WBC 10.6* 9.8  HGB 7.4* 7.2*  HCT 23.3* 22.7*  PLT 445* 443*   BMET Recent Labs    08/23/23 0349 08/24/23 0442  NA 138 136  K 3.8 3.5  CL 102 105  CO2 27 25  GLUCOSE 90 106*  BUN 22 22  CREATININE 0.52 0.50  CALCIUM  8.1* 7.7*   PT/INR No results for input(s): LABPROT, INR in the last 72 hours. ABG No results for input(s): PHART, HCO3 in the last 72 hours.  Invalid input(s): PCO2, PO2  Studies/Results: DG Abd Portable 1V Result Date: 08/24/2023 CLINICAL DATA:  Evaluate small bowel obstruction. History of bowel perforation. EXAM: PORTABLE ABDOMEN - 1 VIEW COMPARISON:  08/23/2023 FINDINGS: There are 2 pigtail drainage catheters within the right paramidline pelvis. Left-sided nephroureteral stent is in place. Persistent dilated small bowel loops are identified within the left hemiabdomen which appears similar to the previous exam measuring up to 4 cm. IMPRESSION: 1. Persistent dilated small bowel loops within the left hemiabdomen compatible with small bowel obstruction. Electronically Signed   By: Waddell Calk M.D.   On: 08/24/2023 06:55   CT ABDOMEN PELVIS W CONTRAST Result Date:  08/23/2023 CLINICAL DATA:  Postoperative abdominal pain. Exploratory laparotomy with Hartmann's procedure. EXAM: CT ABDOMEN AND PELVIS WITH CONTRAST TECHNIQUE: Multidetector CT imaging of the abdomen and pelvis was performed using the standard protocol following bolus administration of intravenous contrast. RADIATION DOSE REDUCTION: This exam was performed according to the departmental dose-optimization program which includes automated exposure control, adjustment of the mA and/or kV according to patient size and/or use of iterative reconstruction technique. CONTRAST:  75mL OMNIPAQUE  IOHEXOL  350 MG/ML SOLN COMPARISON:  CT abdomen and pelvis 08/12/2023 FINDINGS: Lower chest: There is atelectasis in the lung bases. Hepatobiliary: There is a cyst in the left lobe of the liver which appears unchanged measuring 15 mm. No new liver lesions are seen. Gallbladder and bile ducts are within normal limits. Pancreas: Unremarkable. No pancreatic ductal dilatation or surrounding inflammatory changes. Spleen: Normal in size without focal abnormality. Adrenals/Urinary Tract: Left ureteral stent in place. There is no hydronephrosis in either kidney. There are rounded hypodensities in both kidneys which are too small to characterize, likely cysts. There is no perinephric fat stranding. Adrenal glands are within normal limits. There is a small amount of air in the bladder. Stomach/Bowel: There are dilated small bowel loops throughout the mid abdomen with air-fluid levels. The stomach is also dilated with large air-fluid level. Transition point is seen in the right lower quadrant adjacent to the anterior abdominal wall image 2/60. Distal small bowel and colon are nondilated. Left lower quadrant colostomy is present. The appendix is within normal limits.  Air-fluid levels are also scattered throughout the colon. Vascular/Lymphatic: Aorta and IVC are normal in size. There are atherosclerotic calcifications of the aorta. No enlarged lymph  nodes are seen. Reproductive: Uterus and bilateral adnexa are unremarkable. Other: There is no ascites.  There is presacral edema. There is a percutaneous drainage catheter in the anterior right pelvis. There is no associated fluid collection at the end of this catheter. Posterior sacroiliac approach percutaneous catheter is seen ending in the cul-de-sac. There is no definite fluid collection at the tip of the catheter; however, there is enhancing fluid collection just superior to this catheter containing air in the presacral region measuring 5.8 by 2.3 x 1.2 cm. Enhancing fluid collection containing air in the lateral left and anterior anterior pelvis has significantly decreased in size. The largest portion of the collection is now seen on the left image 2/56 measuring 1.4 by 5.0 cm. No free intraperitoneal air. Musculoskeletal: Degenerative changes affect the spine. IMPRESSION: 1. Small-bowel obstruction with transition point in the right lower quadrant. 2. Left lower quadrant colostomy. 3. Percutaneous drainage catheter in the anterior right pelvis without associated fluid collection. 4. Percutaneous drainage catheter in the posterior sacroiliac region ending in the cul-de-sac. There is no definite fluid collection at the tip of the catheter; however, there is a enhancing fluid collection containing air in the presacral region measuring 5.8 x 2.3 x 1.2 cm. 5. Enhancing fluid collection containing air in the lateral left and anterior pelvis has significantly decreased in size. The largest portion of the collection is now seen on the left measuring 1.4 x 5.0 cm. 6. Left ureteral stent in place. No hydronephrosis. 7. Small amount of air in the bladder, possibly related to recent instrumentation. Aortic Atherosclerosis (ICD10-I70.0). Electronically Signed   By: Greig Pique M.D.   On: 08/23/2023 17:57   DG Abd Portable 1V Result Date: 08/23/2023 CLINICAL DATA:  Nausea and vomiting EXAM: PORTABLE ABDOMEN - 1 VIEW  COMPARISON:  X-ray performed August 12, 2023 FINDINGS: Diffuse gaseous distention of bowel is present with the most distended loops present in the left abdomen, similar. There is been interval placement of an internalized left-sided nephroureteral stent. Additionally, right-sided drainage catheter material has been revised. Degenerative changes are present in the imaged osseous structures. IMPRESSION: 1. Diffuse gaseous distension of the bowel, similar when compared to the prior exam. Electronically Signed   By: Maude Naegeli M.D.   On: 08/23/2023 10:12    Anti-infectives: Anti-infectives (From admission, onward)    Start     Dose/Rate Route Frequency Ordered Stop   08/18/23 1800  Ampicillin -Sulbactam (UNASYN ) 3 g in sodium chloride  0.9 % 100 mL IVPB        3 g 200 mL/hr over 30 Minutes Intravenous Every 6 hours 08/18/23 0922     08/06/23 1130  acyclovir  (ZOVIRAX ) 200 MG capsule 400 mg  Status:  Discontinued        400 mg Oral 3 times daily 08/06/23 1037 08/08/23 1424   08/06/23 1100  piperacillin -tazobactam (ZOSYN ) IVPB 3.375 g        3.375 g 12.5 mL/hr over 240 Minutes Intravenous Every 8 hours 08/06/23 1051 08/18/23 1259   07/30/23 1800  piperacillin -tazobactam (ZOSYN ) IVPB 3.375 g        3.375 g 12.5 mL/hr over 240 Minutes Intravenous Every 8 hours 07/30/23 1332 08/04/23 2359   07/30/23 1400  piperacillin -tazobactam (ZOSYN ) IVPB 3.375 g  Status:  Discontinued        3.375 g 100 mL/hr over  30 Minutes Intravenous Every 8 hours 07/30/23 1329 07/30/23 1332   07/30/23 1015  piperacillin -tazobactam (ZOSYN ) IVPB 3.375 g        3.375 g 100 mL/hr over 30 Minutes Intravenous  Once 07/30/23 1013 07/30/23 1311       Assessment/Plan: S/p ex lap with Hartmann's procedure for stercoral colitis with ischemia and perforation with feculent peritonitis, Dr. Polly 6/22  - CT scan (7/5) showed collection remains sizable and has slightly enlarged compared with the previous study. This abuts the rectal  suture line and a leak in this area cannot be excluded. IR repositioned drains, both purulent. Cx with proteus mirabilis and enterococcus  - CT scan 7/17 shows dilated stomach and SBO, some improvement in collections  - WBC 9.8 - pere CT findings and AM x-ray still shows dilated SB loops will place NGT to LIWS, still having ostomy output - Continue to mobilize as tolerated. - Continue BID dressing changes to midline  - Continue following of WOC - Pulm toilet - BID PPI    FEN: NPO and NGT, TPN VTE: Lovenox  ID: [email or web address]   We called her husband while I was in the room to update him as well  - per TR[facility] -  Getting transfusion Nephrolithiasis - per urology,  Chronic constipation - bowel regimen HTN Glaucoma  LOS: 25 days    Dann Hummer, MD, MPH, FACS Trauma & General Surgery Use [email or web address] to contact on call provider  08/24/2023

## 2023-08-24 NOTE — Progress Notes (Signed)
 PT Cancellation Note  Patient Details Name: Angela [name] MRN: [id number] DOB: 1950/10/26   Cancelled Treatment:    Reason Eval/Treat Not Completed: Other (comment) (Pt currently getting NG tube placed again. Pt states that she has been throwing up all night. Politely requests PT to come back tomorrow.)   Caidin Heidenreich 08/24/2023, 9:32 AM

## 2023-08-24 NOTE — Plan of Care (Signed)
 Pt has rested quietly throughout the night with no distress noted. Alert and oriented. On room air. SR on the monitor. Purewick intact to suction. Medicated three times for pain with relief noted. Family member at bedside. No other complaints voiced.     Problem: Education: Goal: Knowledge of General Education information will improve Description: Including pain rating scale, medication(s)/side effects and non-pharmacologic comfort measures Outcome: Progressing   Problem: Health Behavior/Discharge Planning: Goal: Ability to manage health-related needs will improve Outcome: Progressing   Problem: Clinical Measurements: Goal: Respiratory complications will improve Outcome: Progressing Goal: Cardiovascular complication will be avoided Outcome: Progressing   Problem: Pain Managment: Goal: General experience of comfort will improve and/or be controlled Outcome: Progressing

## 2023-08-24 NOTE — Progress Notes (Incomplete)
 PHARMACY - TOTAL PARENTERAL NUTRITION CONSULT NOTE  Indication: intolerance to feeding, recurrent ileus  Patient Measurements: Height: 5' 4 (162.6 cm) Weight: 58.2 kg (128 lb 4.9 oz) IBW/kg (Calculated) : 54.7 TPN AdjBW (KG): 61.2 Body mass index is 22.02 kg/m. Usual Weight: ~59 kg  Assessment:  72 YOF s/p exp lap with Hartmann's procedure for stercoral colitis with ischemia and perforation with feculent peritonitis on 6/22. Noted pt has been NPO/clear liquid/full liquid diet since after surgery with limited intake. Pt now with ileus on Xray (dilated loops of small bowel) and N/V with liquid diet so NGT replaced 7/4, now NPO and starting TPN. Pt at risk of refeeding with prolonged period of little po intake since admission.  Glucose / Insulin : no hx DM - CBGs < 180, off insulin  Electrolytes: Na 136, K 3.5 down (goal >/= 4 for ileus), CoCa 9.62, Mag 1.7 (goal >/= 2 for ileus), phos 2.8  Renal: SCr < 1, BUN WNL Hepatic: LFTs / tbili WNL, albumin  1.6, TG mildly elevated at 152 (7/13) Intake / Output; MIVF: UOP not reliably documented, drain 25 mL, colostomy 0mL, stool 250 mL GI Imaging: 7/5 abd xray - persistent dilated loops of small bowel 7/5 CT - pelvic fluid collection slightly enlarged, suspicious for peritonitis, partially obstructing calculus in L ureter, ileus 7/16 CT small bowel obstruction GI Surgeries / Procedures:  6/22 ex-lap with Hartmann's procedure for stercoral colitis with ischemia and perf with feculent peritonitis 7/8 CT-guided RLQ abd abscess and transgluteal pelvic drains [>422mL tan colored fluid] 7/10 NGT removed  7/11 NGT replaced  7/16 ureteral stent placement   Central access: PICC placed 08/12/23 TPN start date: 08/12/23  Nutritional Goals: Goal TPN 70 ml/hr will provide 89g AA, 1660 kCal   RD Estimated Needs Total Energy Estimated Needs: 1650-1850 kcals Total Protein Estimated Needs: 80-90 g Total Fluid Estimated Needs: >/= 1.7 L  Current Nutrition:   TPN + NPO    Plan:  Continue TPN rate at 70 ml/hr to provide 100% of need Electrolytes in TPN: Increase Na 140 mEq/L, increase K 80 mEq/L, Ca 2 mEq/L, Mg 15 mEq/L, increase Phos 12 mmol/L, Cl:Ac 1:1  Add multivitamin trace elements to TPN  Give KCL IV x 4 runs  Give Mag IV 2 g Monitor TPN labs on Mon/Thurs - labs in AM  [employer], PharmD 08/24/2023, 7:25 AM

## 2023-08-24 NOTE — Progress Notes (Signed)
 OT Cancellation Note  Patient Details Name: Angela [name] MRN: [id number] DOB: 1950-09-13   Cancelled Treatment:    Reason Eval/Treat Not Completed: (P) Patient declined, feeling sick from NG tube, asked to return tomorrow.  [name] 08/24/2023, 11:57 AM

## 2023-08-25 ENCOUNTER — Inpatient Hospital Stay (HOSPITAL_COMMUNITY)

## 2023-08-25 DIAGNOSIS — K631 Perforation of intestine (nontraumatic): Secondary | ICD-10-CM | POA: Diagnosis not present

## 2023-08-25 DIAGNOSIS — Z4682 Encounter for fitting and adjustment of non-vascular catheter: Secondary | ICD-10-CM | POA: Diagnosis not present

## 2023-08-25 DIAGNOSIS — K56609 Unspecified intestinal obstruction, unspecified as to partial versus complete obstruction: Secondary | ICD-10-CM | POA: Diagnosis not present

## 2023-08-25 LAB — COMPREHENSIVE METABOLIC PANEL WITH GFR
ALT: 13 U/L (ref 0–44)
AST: 13 U/L — ABNORMAL LOW (ref 15–41)
Albumin: 1.6 g/dL — ABNORMAL LOW (ref 3.5–5.0)
Alkaline Phosphatase: 73 U/L (ref 38–126)
Anion gap: 7 (ref 5–15)
BUN: 18 mg/dL (ref 8–23)
CO2: 25 mmol/L (ref 22–32)
Calcium: 7.8 mg/dL — ABNORMAL LOW (ref 8.9–10.3)
Chloride: 107 mmol/L (ref 98–111)
Creatinine, Ser: 0.53 mg/dL (ref 0.44–1.00)
GFR, Estimated: >60 mL/min (ref >60)
Glucose, Bld: 98 mg/dL (ref 70–99)
Potassium: 4 mmol/L (ref 3.5–5.1)
Sodium: 139 mmol/L (ref 135–145)
Total Bilirubin: 0.2 mg/dL (ref 0.0–1.2)
Total Protein: 4.9 g/dL — ABNORMAL LOW (ref 6.5–8.1)

## 2023-08-25 LAB — CBC WITH DIFFERENTIAL/PLATELET
Abs Immature Granulocytes: 0.15 K/uL — ABNORMAL HIGH (ref 0.00–0.07)
Basophils Absolute: 0 K/uL (ref 0.0–0.1)
Basophils Relative: 0 %
Eosinophils Absolute: 0.2 K/uL (ref 0.0–0.5)
Eosinophils Relative: 2 %
HCT: 23.5 % — ABNORMAL LOW (ref 36.0–46.0)
Hemoglobin: 7.4 g/dL — ABNORMAL LOW (ref 12.0–15.0)
Immature Granulocytes: 1 %
Lymphocytes Relative: 16 %
Lymphs Abs: 1.8 K/uL (ref 0.7–4.0)
MCH: 28.6 pg (ref 26.0–34.0)
MCHC: 31.5 g/dL (ref 30.0–36.0)
MCV: 90.7 fL (ref 80.0–100.0)
Monocytes Absolute: 0.6 K/uL (ref 0.1–1.0)
Monocytes Relative: 5 %
Neutro Abs: 8.1 K/uL — ABNORMAL HIGH (ref 1.7–7.7)
Neutrophils Relative %: 76 %
Platelets: 463 K/uL — ABNORMAL HIGH (ref 150–400)
RBC: 2.59 MIL/uL — ABNORMAL LOW (ref 3.87–5.11)
RDW: 17.6 % — ABNORMAL HIGH (ref 11.5–15.5)
WBC: 10.8 K/uL — ABNORMAL HIGH (ref 4.0–10.5)
nRBC: 0 % (ref 0.0–0.2)

## 2023-08-25 LAB — GLUCOSE, CAPILLARY
Glucose-Capillary: 102 mg/dL — ABNORMAL HIGH (ref 70–99)
Glucose-Capillary: 105 mg/dL — ABNORMAL HIGH (ref 70–99)
Glucose-Capillary: 120 mg/dL — ABNORMAL HIGH (ref 70–99)
Glucose-Capillary: 128 mg/dL — ABNORMAL HIGH (ref 70–99)
Glucose-Capillary: 98 mg/dL (ref 70–99)

## 2023-08-25 LAB — PHOSPHORUS: Phosphorus: 2.8 mg/dL (ref 2.5–4.6)

## 2023-08-25 LAB — MAGNESIUM: Magnesium: 1.9 mg/dL (ref 1.7–2.4)

## 2023-08-25 LAB — PROCALCITONIN: Procalcitonin: <0.1 ng/mL

## 2023-08-25 LAB — C-REACTIVE PROTEIN: CRP: 1 mg/dL — ABNORMAL HIGH (ref <1.0)

## 2023-08-25 MED ORDER — MAGNESIUM SULFATE 2 GM/50ML IV SOLN
2.0000 g | Freq: Once | INTRAVENOUS | Status: AC
  Administered 2023-08-25: 2 g via INTRAVENOUS
  Filled 2023-08-25: qty 50

## 2023-08-25 MED ORDER — MENTHOL 3 MG MT LOZG
1.0000 | LOZENGE | OROMUCOSAL | Status: DC | PRN
  Administered 2023-08-25: 3 mg via ORAL
  Filled 2023-08-25 (×2): qty 9

## 2023-08-25 MED ORDER — TRAVASOL 10 % IV SOLN
INTRAVENOUS | Status: DC
  Filled 2023-08-25: qty 890.4

## 2023-08-25 NOTE — Plan of Care (Signed)
  Problem: Education: Goal: Knowledge of General Education information will improve Description: Including pain rating scale, medication(s)/side effects and non-pharmacologic comfort measures Outcome: Progressing   Problem: Activity: Goal: Risk for activity intolerance will decrease Outcome: Progressing   Problem: Coping: Goal: Level of anxiety will decrease Outcome: Progressing   Problem: Safety: Goal: Ability to remain free from injury will improve Outcome: Progressing   Problem: Pain Managment: Goal: General experience of comfort will improve and/or be controlled Outcome: Progressing

## 2023-08-25 NOTE — Progress Notes (Signed)
 OT Cancellation Note  Patient Details Name: Angela [name] MRN: [id number] DOB: [birth year]   Cancelled Treatment:    Reason Eval/Treat Not Completed: (P) Patient declined, Pt walked ~500 feet with husband earlier and is feeling tired and nauseous, does not wish to complete more OOB activities, will return as able.   [name] 08/25/2023, 2:10 PM

## 2023-08-25 NOTE — Progress Notes (Signed)
 NG tube advanced to 54 cm

## 2023-08-25 NOTE — Progress Notes (Signed)
 PROGRESS NOTE        PATIENT DETAILS Name: Angela [name] Age: 73 y.o. Sex: female Date of Birth: 06-Dec-1950 Admit Date: 07/30/2023 Admitting Physician Maximino DELENA Sharps, MD ERE:[id number], [name], DO  Brief Summary: Patient is a 73 y.o.  female with history of HTN, HLD who presented with worsening abdominal pain-patient was found to have sigmoid perforation with feculent peritonitis in the setting of stercoral colitis.  Patient underwent sigmoidectomy with end colostomy on 6/22-unfortunately postop course complicated by ileus and pelvic residual abscesses requiring IR drain placement.    Significant events: 6/21>> evaluated in the ED for abdominal pain-found to have left mid ureteral stone-9 mm with hydronephrosis-and large volume of stool-managed with supportive care and discharged home. 6/22>> worsening abdominal pain-repeat CT scan consistent with bowel perforation-surgery consult-admit to [facility]. 6/29>> CT abdomen with pelvic abscess. 6/30>> CT-guided drain and pelvic abscess by IR 7/04>> vomiting-recurrent ileus-NG tube reinserted 7/05>> TNA started-repeat CT abdomen increased size of abscess. 7/07>> IR placed RLQ drain and right transgluteal pelvic drain. 7/10>> NGT removed  Significant studies: 6/22>> CT renal stone study: Interval development of intraperitoneal free air-moderate volume free fluid-consistent with bowel perforation, persistent large volume stool with dilated sigmoid colon.  Similar location of left ureteral stone-9 mm. 6/29>> CT abdomen/pelvis: S/p partial colectomy-LLQ ostomy-complex free fluid with multiple foci of air-most pronounced in the surgical bed-possibility of superimposed infection or bowel leak cannot be excluded. 6/30>> echo: EF 60-65%. 7/05>> CT abdomen/pelvis: Previous collection with pigtail has increased in size, other less well-defined but enlarging peritoneal fluid collections.  Persistent-partially obstructing 11 mm calculus  in the distal left ureter.  Persistent fluid loops of dilated small bowel-likely ileus.  Significant microbiology data: 6/22>> urine culture: No growth 6/22>> blood culture: No growth 6/30>> pelvic abscess culture: Gram-negative rods/gram-positive cocci-mixed anaerobic flora. 7/07>> Pelvic abscess culture:moderate proteus and enterococcus  Procedures: 6/22>> sigmoidectomy with end colostomy.  By general surgeon Dr. Polly 6/30>> CT-guided drain and pelvic abscess by IR 08/19/2023 1.  Cystoscopy with left ureteral stent placement. 2. left retrograde pyelogram with interpretation. 3. Fluoroscopy <1 hour with intraoperative interpretation by urologist Dr. Arlyss Foot  Consults: General Surgery IR Urology  Subjective: Patient in bed, no headache or chest pain, no shortness of breath, nausea has improved but still has NG tube.  No abdominal pain  Objective: Vitals: Blood pressure (!) 154/76, pulse 74, temperature 97.6 F (36.4 C), temperature source Oral, resp. rate 12, height 5' 4 (1.626 m), weight 58.2 kg, SpO2 93%.   Exam:  Awake Alert, No new F.N deficits, Normal affect [place].AT,PERRAL Supple Neck, No JVD,   Symmetrical Chest wall movement, Good air movement bilaterally, CTAB RRR,No Gallops, Rubs or new Murmurs,  Abdomen is more distended on 08/24/2023. 2 abdominal drains, colostomy bag with brown stool, right arm PICC line, NG tube in place.   Assessment/Plan:  Sepsis secondary to stercoral colitis with ischemia/perforation of sigmoid colon with feculent peritonitis-s/p sigmoid colectomy/ostomy on 6/22-complicated by postoperative ileus and then residual pelvic abscess requiring CT-guided drain placement by on 6/30. Ileus seems to be slowly improving-multiple output in ostomy bag overnight.  NG tube has been removed. Remains n.p.o. for ureteral stent placement later today. Remains on Zosyn  >> Unasyn  per General Surgery. General Surgery following-for management of this problem  to general surgery, CT abdomen pelvis being repeated on 08/23/2023 suggestive of small bowel  obstruction, continue TNA, has NG tube to intermittent suction, x-ray morning of 08/25/2023 shows repeat small bowel dilation, general surgery following defer management to general surgery  Hydronephrosis secondary to distal left ureteral stone Urology following-with recommendations for supportive care for now-now that her medical problems have more or less stabilized underwent left ureteral stent placement by urology Dr. Arlyss Foot on 08/18/2023  Hospital delirium Secondary to acute illness/narcotics Now resolved  Normocytic anemia Due to acute illness,+/- coffee-ground emesis on 7/7 Hemoglobin stable although low, type screen done will continue to monitor transfuse if she drops consistently close to hemoglobin of 7.  Thrombocytosis Likely reactive Supportive care  Coffee-ground NG tube emesis Seems to have resolved-no longer coffee-ground emesis-bile appearing fluid in canister. Continue PPI Hb stable, staying around 7.5, from multiple blood draws and some perioperative blood loss along with him dilution.  Patient and family agreeable for transfusion if needed.  Hypomagnesemia/hypophosphatemia/hypokalemia Secondary to NG tube suctioning Resolved.  HTN BP stable-all oral antihypertensives on hold due to n.p.o. status Continue with as needed IV hydralazine  and metoprolol .   Lower extremity edema Secondary to hypoalbuminemia/IV fluid resuscitation in the setting of acute illness Echo stable-UA negative for proteinuria Will start diuretics once she is a bit more stable.  Chronic idiopathic constipation Bowel function recovering-brown stools Once more stable-will need to be placed on a bowel regimen.  Odynophagia secondary to oral/aphthous ulcers Improved on Valtrex and Magic mouthwash Supportive care  Glaucoma Timolol  eyedrops  Insomnia Appears to be primarily due to  pain-anxiety Schedule trazodone  at night If no response-use IV Benadryl   Debility/deconditioning PT OT eval-Home health recommended.  Code status:   Code Status: Full Code   DVT Prophylaxis: enoxaparin  (LOVENOX ) injection 40 mg Start: 08/17/23 1000 Place TED hose Start: 08/09/23 1000 SCDs Start: 07/30/23 1953   Family Communication: Family member bedside on 08/22/2023, 08/23/2023   Disposition Plan: Status is: Inpatient Remains inpatient appropriate because: Severity of illness   Planned Discharge Destination:Home health   Diet: Diet Order             Diet NPO time specified Except for: Sips with Meds, Ice Chips  Diet effective midnight                    Data Review:   Inpatient Medications  Scheduled Meds:  acetaminophen   1,000 mg Oral Q6H   bisacodyl   10 mg Rectal Daily   Chlorhexidine  Gluconate Cloth  6 each Topical Daily   enoxaparin  (LOVENOX ) injection  40 mg Subcutaneous Q24H   feeding supplement  237 mL Oral BID BM   ferrous sulfate   325 mg Oral BID WC   folic acid   1 mg Oral Daily   latanoprost   1 drop Both Eyes QHS   methocarbamol   500 mg Oral TID   Or   methocarbamol  (ROBAXIN ) injection  500 mg Intravenous TID   pantoprazole  (PROTONIX ) IV  40 mg Intravenous Q12H   polyethylene glycol  17 g Oral BID   sodium chloride  flush  5 mL Intracatheter Q8H   timolol   1 drop Both Eyes BID   traZODone   50 mg Oral QHS   Continuous Infusions:  ampicillin -sulbactam (UNASYN ) IV Stopped (08/25/23 0705)   magnesium  sulfate bolus IVPB 2 g (08/25/23 0832)   TPN ADULT (ION) 70 mL/hr at 08/24/23 1900   TPN ADULT (ION)     PRN Meds:.albuterol , diphenhydrAMINE , hydrALAZINE , HYDROmorphone  (DILAUDID ) injection, melatonin, metoprolol  tartrate, ondansetron  **OR** ondansetron  (ZOFRAN ) IV, mouth rinse, oxyCODONE , phenol, sodium chloride  flush  DVT Prophylaxis  enoxaparin  (LOVENOX ) injection 40 mg Start: 08/17/23 1000 Place TED hose Start: 08/09/23 1000 SCDs Start:  07/30/23 1953   Recent Labs  Lab 08/21/23 0317 08/22/23 0400 08/23/23 0349 08/24/23 0442 08/25/23 0339  WBC 11.2* 13.1* 10.6* 9.8 10.8*  HGB 7.3* 7.7* 7.4* 7.2* 7.4*  HCT 23.4* 24.9* 23.3* 22.7* 23.5*  PLT 427* 462* 445* 443* 463*  MCV 89.0 91.5 91.0 90.1 90.7  MCH 27.8 28.3 28.9 28.6 28.6  MCHC 31.2 30.9 31.8 31.7 31.5  RDW 16.5* 16.8* 17.1* 17.2* 17.6*  LYMPHSABS 1.8 2.0 1.9 1.7 1.8  MONOABS 0.9 0.7 0.6 0.5 0.6  EOSABS 0.2 0.3 0.3 0.2 0.2  BASOSABS 0.1 0.1 0.1 0.1 0.0    Recent Labs  Lab 08/20/23 0349 08/21/23 0317 08/22/23 0400 08/23/23 0349 08/24/23 0442 08/25/23 0339  NA 138 137 138 138 136 139  K 3.8 3.7 4.2 3.8 3.5 4.0  CL 103 104 103 102 105 107  CO2 28 27 26 27 25 25   ANIONGAP 7 6 9 9 6 7   GLUCOSE 109* 113* 94 90 106* 98  BUN 21 18 22 22 22 18   CREATININE 0.50 0.47 0.51 0.52 0.50 0.53  AST 17 19 17 18  14* 13*  ALT 14 14 14 15 15 13   ALKPHOS 73 84 89 75 75 73  BILITOT 0.4 0.3 0.3 0.3 0.2 0.2  ALBUMIN  1.7* 1.7* 1.9* 1.7* 1.6* 1.6*  CRP 1.3* 1.0* 1.0*  --  1.6* 1.0*  PROCALCITON <0.10 <0.10 <0.10 <0.10 <0.10 <0.10  MG 1.8 2.0 1.9 2.0 1.7 1.9  PHOS 4.0 3.5 3.4 3.7 2.8 2.8  CALCIUM  8.4* 8.0* 8.5* 8.1* 7.7* 7.8*      Recent Labs  Lab 08/20/23 0349 08/21/23 0317 08/22/23 0400 08/23/23 0349 08/24/23 0442 08/25/23 0339  CRP 1.3* 1.0* 1.0*  --  1.6* 1.0*  PROCALCITON <0.10 <0.10 <0.10 <0.10 <0.10 <0.10  MG 1.8 2.0 1.9 2.0 1.7 1.9  CALCIUM  8.4* 8.0* 8.5* 8.1* 7.7* 7.8*    --------------------------------------------------------------------------------------------------------------- Lab Results  Component Value Date   CHOL 183 11/30/2021   HDL 62.70 11/30/2021   LDLCALC 101 (H) 11/30/2021   LDLDIRECT 124.0 10/23/2020   TRIG 152 (H) 08/21/2023   CHOLHDL 3 11/30/2021    Lab Results  Component Value Date   HGBA1C 6.0 11/30/2021   No results for input(s): TSH, T4TOTAL, FREET4, T3FREE, THYROIDAB in the last 72 hours. No results for  input(s): VITAMINB12, FOLATE, FERRITIN, TIBC, IRON, RETICCTPCT in the last 72 hours. ------------------------------------------------------------------------------------------------------------------ Cardiac Enzymes No results for input(s): CKMB, TROPONINI, MYOGLOBIN in the last 168 hours.  Invalid input(s): CK  Radiology Reports  DG Abd Portable 1V Result Date: 08/25/2023 EXAM: 1 VIEW XRAY OF THE ABDOMEN 08/25/2023 06:38:00 AM COMPARISON: 08/24/2023 CLINICAL HISTORY: [id number] SBO (small bowel obstruction) (HCC) [id number]. SBO. Pt complains of abdominal pain FINDINGS: BOWEL: Persistent dilated loops of small bowel noted in the left lower quadrant of the abdomen measuring up to 4.1 cm previously 4.0 cm. SOFT TISSUES: No abnormal calcifications or opaque urinary calculi. There is a right lower quadrant approach pigtail drainage catheter with distal end in the right paramidline pelvis. BONES: No acute osseous abnormality. LINES AND TUBES: There is an enteric tube with tip approximately 6.2 cm below the GE junction. The side port is 1.5 cm above the GE junction. Recommend advancing enteric tube. Left-sided nephroureteral stent is again noted. IMPRESSION: 1. Persistent dilated loops of small bowel in the left lower quadrant, measuring up to 4.1 cm, previously  4.0 cm, consistent with small bowel obstruction. 2. Enteric tube with tip approximately 6.2 cm below the GE junction and side port 1.5 cm above the GE junction. Recommend advancing enteric tube. 3. Left-sided nephroureteral stent and right lower quadrant approach pigtail drainage catheter with distal end in the right paramidline pelvis. Electronically signed by: Waddell Calk MD 08/25/2023 07:19 AM EDT RP Workstation: [id number]   DG Abd Portable 1V Result Date: 08/24/2023 CLINICAL DATA:  [id number] Encounter for nasogastric (NG) tube placement [id number] EXAM: PORTABLE ABDOMEN - 1 VIEW COMPARISON:  None Available. FINDINGS: Esophagogastric tube  terminates at the GE junction. Nonobstructive bowel gas pattern. No pneumoperitoneum. Partially visualized left ureteral stent. No acute fracture or destructive lesion. The lung bases are clear. IMPRESSION: Esophagogastric tube terminates in the region of the GE junction. The last side hole is in the distal esophagus and continued advancement is recommended. Electronically Signed   By: Rogelia Myers M.D.   On: 08/24/2023 11:48   DG Abd Portable 1V Result Date: 08/24/2023 CLINICAL DATA:  Evaluate small bowel obstruction. History of bowel perforation. EXAM: PORTABLE ABDOMEN - 1 VIEW COMPARISON:  08/23/2023 FINDINGS: There are 2 pigtail drainage catheters within the right paramidline pelvis. Left-sided nephroureteral stent is in place. Persistent dilated small bowel loops are identified within the left hemiabdomen which appears similar to the previous exam measuring up to 4 cm. IMPRESSION: 1. Persistent dilated small bowel loops within the left hemiabdomen compatible with small bowel obstruction. Electronically Signed   By: Waddell Calk M.D.   On: 08/24/2023 06:55   CT ABDOMEN PELVIS W CONTRAST Result Date: 08/23/2023 CLINICAL DATA:  Postoperative abdominal pain. Exploratory laparotomy with Hartmann's procedure. EXAM: CT ABDOMEN AND PELVIS WITH CONTRAST TECHNIQUE: Multidetector CT imaging of the abdomen and pelvis was performed using the standard protocol following bolus administration of intravenous contrast. RADIATION DOSE REDUCTION: This exam was performed according to the departmental dose-optimization program which includes automated exposure control, adjustment of the mA and/or kV according to patient size and/or use of iterative reconstruction technique. CONTRAST:  75mL OMNIPAQUE  IOHEXOL  350 MG/ML SOLN COMPARISON:  CT abdomen and pelvis 08/12/2023 FINDINGS: Lower chest: There is atelectasis in the lung bases. Hepatobiliary: There is a cyst in the left lobe of the liver which appears unchanged measuring  15 mm. No new liver lesions are seen. Gallbladder and bile ducts are within normal limits. Pancreas: Unremarkable. No pancreatic ductal dilatation or surrounding inflammatory changes. Spleen: Normal in size without focal abnormality. Adrenals/Urinary Tract: Left ureteral stent in place. There is no hydronephrosis in either kidney. There are rounded hypodensities in both kidneys which are too small to characterize, likely cysts. There is no perinephric fat stranding. Adrenal glands are within normal limits. There is a small amount of air in the bladder. Stomach/Bowel: There are dilated small bowel loops throughout the mid abdomen with air-fluid levels. The stomach is also dilated with large air-fluid level. Transition point is seen in the right lower quadrant adjacent to the anterior abdominal wall image 2/60. Distal small bowel and colon are nondilated. Left lower quadrant colostomy is present. The appendix is within normal limits. Air-fluid levels are also scattered throughout the colon. Vascular/Lymphatic: Aorta and IVC are normal in size. There are atherosclerotic calcifications of the aorta. No enlarged lymph nodes are seen. Reproductive: Uterus and bilateral adnexa are unremarkable. Other: There is no ascites.  There is presacral edema. There is a percutaneous drainage catheter in the anterior right pelvis. There is no associated fluid collection at the end of  this catheter. Posterior sacroiliac approach percutaneous catheter is seen ending in the cul-de-sac. There is no definite fluid collection at the tip of the catheter; however, there is enhancing fluid collection just superior to this catheter containing air in the presacral region measuring 5.8 by 2.3 x 1.2 cm. Enhancing fluid collection containing air in the lateral left and anterior anterior pelvis has significantly decreased in size. The largest portion of the collection is now seen on the left image 2/56 measuring 1.4 by 5.0 cm. No free  intraperitoneal air. Musculoskeletal: Degenerative changes affect the spine. IMPRESSION: 1. Small-bowel obstruction with transition point in the right lower quadrant. 2. Left lower quadrant colostomy. 3. Percutaneous drainage catheter in the anterior right pelvis without associated fluid collection. 4. Percutaneous drainage catheter in the posterior sacroiliac region ending in the cul-de-sac. There is no definite fluid collection at the tip of the catheter; however, there is a enhancing fluid collection containing air in the presacral region measuring 5.8 x 2.3 x 1.2 cm. 5. Enhancing fluid collection containing air in the lateral left and anterior pelvis has significantly decreased in size. The largest portion of the collection is now seen on the left measuring 1.4 x 5.0 cm. 6. Left ureteral stent in place. No hydronephrosis. 7. Small amount of air in the bladder, possibly related to recent instrumentation. Aortic Atherosclerosis (ICD10-I70.0). Electronically Signed   By: Greig Pique M.D.   On: 08/23/2023 17:57   DG Abd Portable 1V Result Date: 08/23/2023 CLINICAL DATA:  Nausea and vomiting EXAM: PORTABLE ABDOMEN - 1 VIEW COMPARISON:  X-ray performed August 12, 2023 FINDINGS: Diffuse gaseous distention of bowel is present with the most distended loops present in the left abdomen, similar. There is been interval placement of an internalized left-sided nephroureteral stent. Additionally, right-sided drainage catheter material has been revised. Degenerative changes are present in the imaged osseous structures. IMPRESSION: 1. Diffuse gaseous distension of the bowel, similar when compared to the prior exam. Electronically Signed   By: Maude Naegeli M.D.   On: 08/23/2023 10:12   Signature  -   Lavada Stank M.D on 08/25/2023 at 8:53 AM   -  To page go to [email or web address]

## 2023-08-25 NOTE — Progress Notes (Signed)
 PHARMACY - TOTAL PARENTERAL NUTRITION CONSULT NOTE  Indication: intolerance to feeding, recurrent ileus  Patient Measurements: Height: 5' 4 (162.6 cm) Weight: 58.2 kg (128 lb 4.9 oz) IBW/kg (Calculated) : 54.7 TPN AdjBW (KG): 61.2 Body mass index is 22.02 kg/m. Usual Weight: ~59 kg  Assessment:  72 YOF s/p exp lap with Hartmann's procedure for stercoral colitis with ischemia and perforation with feculent peritonitis on 6/22. Noted pt has been NPO/clear liquid/full liquid diet since after surgery with limited intake. Pt now with ileus on Xray (dilated loops of small bowel) and N/V with liquid diet so NGT replaced 7/4, now NPO and starting TPN. Pt at risk of refeeding with prolonged period of little po intake since admission.  Glucose / Insulin : no hx DM - CBGs < 180, off insulin  Electrolytes: K 4 (goal >/= 4 for ileus), Mag 1.9 (goal >/= 2 for ileus), others WNL Renal: SCr < 1, BUN WNL Hepatic: LFTs / tbili WNL, albumin  1.6, TG mildly elevated at 152 (7/13) Intake / Output; MIVF: UOP not reliably documented, drain 0 mL, colostomy 0mL, stool 300 mL GI Imaging: 7/5 abd xray - persistent dilated loops of small bowel 7/5 CT - pelvic fluid collection slightly enlarged, suspicious for peritonitis, partially obstructing calculus in L ureter, ileus 7/16 CT small bowel obstruction 7/17 abd XR - SBO GI Surgeries / Procedures:  6/22 ex-lap with Hartmann's procedure for stercoral colitis with ischemia and perf with feculent peritonitis 7/8 CT-guided RLQ abd abscess and transgluteal pelvic drains [>45mL tan colored fluid] 7/10 NGT removed  7/11 NGT replaced  7/16 ureteral stent placement   Central access: PICC placed 08/12/23 TPN start date: 08/12/23  Nutritional Goals: Goal TPN 70 ml/hr will provide 89g AA, 1660 kCal   RD Estimated Needs Total Energy Estimated Needs: 1650-1850 kcals Total Protein Estimated Needs: 80-90 g Total Fluid Estimated Needs: >/= 1.7 L  Current Nutrition:   TPN  Plan:  Continue TPN rate at 70 ml/hr to provide 100% of need Electrolytes in TPN: Na 140 mEq/L, K to 70 mEq/L, Ca 2 mEq/L, Mg 15 mEq/L (max), Phos 9 mmol/L, Cl:Ac 1:1  Add multivitamin trace elements to TPN  Mag 2gm IV x 1 Monitor TPN labs on Mon/Thurs - labs in AM  Donny Alert, PharmD, [place] Clinical Pharmacist Please see AMION for all Pharmacists' Contact Phone Numbers 08/25/2023, 7:37 AM

## 2023-08-25 NOTE — Progress Notes (Signed)
 Patient ID: LUANA TATRO, female   DOB: Oct 25, 1950, 73 y.o.   MRN: [id number] 7 Days Post-Op    Subjective: Sore throat witn NGT ROS negative except as listed above. Objective: Vital signs in last 24 hours: Temp:  [97.6 F (36.4 C)-97.9 F (36.6 C)] 97.6 F (36.4 C) (07/18 0400) Pulse Rate:  [72-74] 74 (07/18 0400) Resp:  [12-17] 12 (07/18 0400) BP: (132-159)/(64-77) 154/76 (07/18 0400) SpO2:  [93 %-94 %] 93 % (07/18 0400) Last BM Date : 08/24/23  Intake/Output from previous day: 07/17 0701 - 07/18 0700 In: 2044 [I.V.:904; NG/GT:140; IV Piggyback:1000] Out: 3085 [Urine:2150; Emesis/NG output:600; Drains:35; Stool:300] Intake/Output this shift: Total I/O In: 100.1 [IV Piggyback:100.1] Out: 550 [Urine:550]  General appearance: alert and cooperative GI: soft, midline with some fibrin but granulating, ostomy pink with output  Lab Results: CBC  Recent Labs    08/24/23 0442 08/25/23 0339  WBC 9.8 10.8*  HGB 7.2* 7.4*  HCT 22.7* 23.5*  PLT 443* 463*   BMET Recent Labs    08/24/23 0442 08/25/23 0339  NA 136 139  K 3.5 4.0  CL 105 107  CO2 25 25  GLUCOSE 106* 98  BUN 22 18  CREATININE 0.50 0.53  CALCIUM  7.7* 7.8*   PT/INR No results for input(s): LABPROT, INR in the last 72 hours. ABG No results for input(s): PHART, HCO3 in the last 72 hours.  Invalid input(s): PCO2, PO2  Studies/Results:   Anti-infectives: Anti-infectives (From admission, onward)    Start     Dose/Rate Route Frequency Ordered Stop   08/18/23 1800  Ampicillin -Sulbactam (UNASYN ) 3 g in sodium chloride  0.9 % 100 mL IVPB        3 g 200 mL/hr over 30 Minutes Intravenous Every 6 hours 08/18/23 0922     08/06/23 1130  acyclovir  (ZOVIRAX ) 200 MG capsule 400 mg  Status:  Discontinued        400 mg Oral 3 times daily 08/06/23 1037 08/08/23 1424   08/06/23 1100  piperacillin -tazobactam (ZOSYN ) IVPB 3.375 g        3.375 g 12.5 mL/hr over 240 Minutes Intravenous Every 8 hours  08/06/23 1051 08/18/23 1259   07/30/23 1800  piperacillin -tazobactam (ZOSYN ) IVPB 3.375 g        3.375 g 12.5 mL/hr over 240 Minutes Intravenous Every 8 hours 07/30/23 1332 08/04/23 2359   07/30/23 1400  piperacillin -tazobactam (ZOSYN ) IVPB 3.375 g  Status:  Discontinued        3.375 g 100 mL/hr over 30 Minutes Intravenous Every 8 hours 07/30/23 1329 07/30/23 1332   07/30/23 1015  piperacillin -tazobactam (ZOSYN ) IVPB 3.375 g        3.375 g 100 mL/hr over 30 Minutes Intravenous  Once 07/30/23 1013 07/30/23 1311       Assessment/Plan: S/p ex lap with Hartmann's procedure for stercoral colitis with ischemia and perforation with feculent peritonitis, Dr. Polly 6/22  - CT scan (7/5) showed collection remains sizable and has slightly enlarged compared with the previous study. This abuts the rectal suture line and a leak in this area cannot be excluded. IR repositioned drains, both purulent. Cx with proteus mirabilis and enterococcus  - CT scan 7/17 shows dilated stomach and SBO, some improvement in collections  - WBC 10.8 - per CT findings and today AM x-ray still shows dilated SB loops will continue NGT to LIWS, still having ostomy output - Continue to mobilize as tolerated. - Continue BID dressing changes to midline  - Continue following of WOC -  Pulm toilet - BID PPI    FEN: NPO and NGT, TPN, cepacol lozenges for throat pain from NGT VTE: Lovenox  ID: Unasyn   LOS: 26 days    Dann Hummer, MD, MPH, FACS Trauma & General Surgery Use [email or web address] to contact on call provider  08/25/2023

## 2023-08-26 ENCOUNTER — Inpatient Hospital Stay (HOSPITAL_COMMUNITY)

## 2023-08-26 DIAGNOSIS — K631 Perforation of intestine (nontraumatic): Secondary | ICD-10-CM | POA: Diagnosis not present

## 2023-08-26 LAB — CBC WITH DIFFERENTIAL/PLATELET
Abs Immature Granulocytes: 0.11 K/uL — ABNORMAL HIGH (ref 0.00–0.07)
Basophils Absolute: 0.1 K/uL (ref 0.0–0.1)
Basophils Relative: 1 %
Eosinophils Absolute: 0.2 K/uL (ref 0.0–0.5)
Eosinophils Relative: 2 %
HCT: 23.8 % — ABNORMAL LOW (ref 36.0–46.0)
Hemoglobin: 7.6 g/dL — ABNORMAL LOW (ref 12.0–15.0)
Immature Granulocytes: 1 %
Lymphocytes Relative: 15 %
Lymphs Abs: 1.6 K/uL (ref 0.7–4.0)
MCH: 29 pg (ref 26.0–34.0)
MCHC: 31.9 g/dL (ref 30.0–36.0)
MCV: 90.8 fL (ref 80.0–100.0)
Monocytes Absolute: 0.5 K/uL (ref 0.1–1.0)
Monocytes Relative: 5 %
Neutro Abs: 8.3 K/uL — ABNORMAL HIGH (ref 1.7–7.7)
Neutrophils Relative %: 76 %
Platelets: 482 K/uL — ABNORMAL HIGH (ref 150–400)
RBC: 2.62 MIL/uL — ABNORMAL LOW (ref 3.87–5.11)
RDW: 18.1 % — ABNORMAL HIGH (ref 11.5–15.5)
WBC: 10.8 K/uL — ABNORMAL HIGH (ref 4.0–10.5)
nRBC: 0 % (ref 0.0–0.2)

## 2023-08-26 LAB — COMPREHENSIVE METABOLIC PANEL WITH GFR
ALT: 11 U/L (ref 0–44)
AST: 14 U/L — ABNORMAL LOW (ref 15–41)
Albumin: 1.6 g/dL — ABNORMAL LOW (ref 3.5–5.0)
Alkaline Phosphatase: 67 U/L (ref 38–126)
Anion gap: 6 (ref 5–15)
BUN: 18 mg/dL (ref 8–23)
CO2: 27 mmol/L (ref 22–32)
Calcium: 7.9 mg/dL — ABNORMAL LOW (ref 8.9–10.3)
Chloride: 107 mmol/L (ref 98–111)
Creatinine, Ser: 0.44 mg/dL (ref 0.44–1.00)
GFR, Estimated: >60 mL/min (ref >60)
Glucose, Bld: 109 mg/dL — ABNORMAL HIGH (ref 70–99)
Potassium: 3.6 mmol/L (ref 3.5–5.1)
Sodium: 140 mmol/L (ref 135–145)
Total Bilirubin: 0.2 mg/dL (ref 0.0–1.2)
Total Protein: 5 g/dL — ABNORMAL LOW (ref 6.5–8.1)

## 2023-08-26 LAB — GLUCOSE, CAPILLARY
Glucose-Capillary: 107 mg/dL — ABNORMAL HIGH (ref 70–99)
Glucose-Capillary: 109 mg/dL — ABNORMAL HIGH (ref 70–99)
Glucose-Capillary: 112 mg/dL — ABNORMAL HIGH (ref 70–99)
Glucose-Capillary: 121 mg/dL — ABNORMAL HIGH (ref 70–99)
Glucose-Capillary: 85 mg/dL (ref 70–99)
Glucose-Capillary: 93 mg/dL (ref 70–99)
Glucose-Capillary: 98 mg/dL (ref 70–99)

## 2023-08-26 LAB — MAGNESIUM: Magnesium: 1.8 mg/dL (ref 1.7–2.4)

## 2023-08-26 LAB — PROCALCITONIN: Procalcitonin: <0.1 ng/mL

## 2023-08-26 LAB — PHOSPHORUS: Phosphorus: 3.3 mg/dL (ref 2.5–4.6)

## 2023-08-26 LAB — C-REACTIVE PROTEIN: CRP: 1 mg/dL — ABNORMAL HIGH (ref <1.0)

## 2023-08-26 MED ORDER — DAKINS (1/4 STRENGTH) 0.125 % EX SOLN
Freq: Two times a day (BID) | CUTANEOUS | Status: AC
  Filled 2023-08-26: qty 473

## 2023-08-26 MED ORDER — DEXTROSE 10 % IV SOLN: INTRAVENOUS | Status: AC

## 2023-08-26 MED ORDER — MAGNESIUM SULFATE 4 GM/100ML IV SOLN
4.0000 g | Freq: Once | INTRAVENOUS | Status: AC
  Administered 2023-08-26: 4 g via INTRAVENOUS
  Filled 2023-08-26: qty 100

## 2023-08-26 MED ORDER — POTASSIUM CHLORIDE 10 MEQ/50ML IV SOLN
10.0000 meq | INTRAVENOUS | Status: AC
  Administered 2023-08-26 (×6): 10 meq via INTRAVENOUS
  Filled 2023-08-26 (×6): qty 50

## 2023-08-26 MED ORDER — TRAVASOL 10 % IV SOLN
INTRAVENOUS | Status: AC
  Filled 2023-08-26: qty 890.4

## 2023-08-26 NOTE — Progress Notes (Signed)
 PHARMACY - TOTAL PARENTERAL NUTRITION CONSULT NOTE  Indication: intolerance to feeding, recurrent ileus  Patient Measurements: Height: 5' 4 (162.6 cm) Weight: 58.1 kg (128 lb 1.4 oz) IBW/kg (Calculated) : 54.7 TPN AdjBW (KG): 61.2 Body mass index is 21.99 kg/m. Usual Weight: ~59 kg  Assessment:  72 YOF s/p exp lap with Hartmann's procedure for stercoral colitis with ischemia and perforation with feculent peritonitis on 6/22. Noted pt has been NPO/clear liquid/full liquid diet since after surgery with limited intake. Pt now with ileus on Xray (dilated loops of small bowel) and N/V with liquid diet so NGT replaced 7/4, now NPO and starting TPN. Pt at risk of refeeding with prolonged period of little po intake since admission.  Glucose / Insulin : no hx DM - CBGs < 180, off insulin  Electrolytes: K 3.6 down (goal >/= 4 for ileus), Mag 1.8 (goal >/= 2 for ileus), others WNL Renal: SCr < 1, BUN WNL Hepatic: LFTs / tbili WNL, albumin  1.6, TG mildly elevated at 152 (7/13) Intake / Output; MIVF: UOP not reliably documented (1500 mL in chart), drain 0 mL, colostomy 0 mL, stool 0 mL GI Imaging: 7/5 abd xray - persistent dilated loops of small bowel 7/5 CT - pelvic fluid collection slightly enlarged, suspicious for peritonitis, partially obstructing calculus in L ureter, ileus 7/16 CT small bowel obstruction 7/17 abd XR - SBO 7/19 abd XR - enteric tube in stomach, improving bowel gas pattern, oral contrast in R colon  GI Surgeries / Procedures:  6/22 ex-lap with Hartmann's procedure for stercoral colitis with ischemia and perf with feculent peritonitis 7/8 CT-guided RLQ abd abscess and transgluteal pelvic drains [>439mL tan colored fluid] 7/10 NGT removed  7/11 NGT replaced  7/16 ureteral stent placement   Central access: PICC placed 08/12/23 TPN start date: 08/12/23  Nutritional Goals: Goal TPN 70 ml/hr will provide 89g AA, 1660 kCal   RD Estimated Needs Total Energy Estimated Needs:  1650-1850 kcals Total Protein Estimated Needs: 80-90 g Total Fluid Estimated Needs: >/= 1.7 L  Current Nutrition:  TPN - TPN was unhooked by patient's family early this AM, was placced on D10W infusion at same rate  Plan: 60 mEq KCL and 4 g magnesium  IV today  Resume TPN rate at 70 ml/hr to provide 100% of need Electrolytes in TPN: Na 140 mEq/L, K to 70 mEq/L, Ca 2 mEq/L, Mg 15 mEq/L (max), Phos 9 mmol/L, Cl:Ac 1:1  Add multivitamin trace elements to TPN  Monitor TPN labs on Mon/Thurs - labs in AM  Rankin Sams, PharmD, BCPS, BCCCP Clinical Pharmacist

## 2023-08-26 NOTE — Progress Notes (Addendum)
 Patient ID: Angela [name], female   DOB: 1950/10/06, 73 y.o.   MRN: [id number] 8 Days Post-Op    Subjective: NAEO. Reports gas and small amt stool in colostomy yesterday. States she was mobilizing more before she got drains/NGT had to be replaced.   ROS negative except as listed above. Objective: Vital signs in last 24 hours: Temp:  [97.9 F (36.6 C)-99 F (37.2 C)] 97.9 F (36.6 C) (07/19 0804) Pulse Rate:  [75] 75 (07/19 0804) Resp:  [16] 16 (07/19 0804) BP: (128-167)/(65-87) 135/69 (07/19 0804) SpO2:  [96 %] 96 % (07/18 2000) Weight:  [58.1 kg] 58.1 kg (07/19 0500) Last BM Date : 08/24/23  Intake/Output from previous day: 07/18 0701 - 07/19 0700 In: 100.1 [IV Piggyback:100.1] Out: 3257.5 [Urine:2407.5; Emesis/NG output:850] Intake/Output this shift: Total I/O In: 5 [I.V.:5] Out: 600 [Urine:600]  General appearance: alert and cooperative GI: soft, midline with some fibrin but granulating as below - some light blue purulent drainage on guaze, ostomy pink with scant purulent output  NGT - 887mL/24h, mostly AM shift  JP RLQ - purulent     Lab Results: CBC  Recent Labs    08/25/23 0339 08/26/23 0430  WBC 10.8* 10.8*  HGB 7.4* 7.6*  HCT 23.5* 23.8*  PLT 463* 482*   BMET Recent Labs    08/25/23 0339 08/26/23 0430  NA 139 140  K 4.0 3.6  CL 107 107  CO2 25 27  GLUCOSE 98 109*  BUN 18 18  CREATININE 0.53 0.44  CALCIUM  7.8* 7.9*   PT/INR No results for input(s): LABPROT, INR in the last 72 hours. ABG No results for input(s): PHART, HCO3 in the last 72 hours.  Invalid input(s): PCO2, PO2  Studies/Results:   Anti-infectives: Anti-infectives (From admission, onward)    Start     Dose/Rate Route Frequency Ordered Stop   08/18/23 1800  Ampicillin -Sulbactam (UNASYN ) 3 g in sodium chloride  0.9 % 100 mL IVPB        3 g 200 mL/hr over 30 Minutes Intravenous Every 6 hours 08/18/23 0922     08/06/23 1130  acyclovir  (ZOVIRAX ) 200 MG capsule 400 mg   Status:  Discontinued        400 mg Oral 3 times daily 08/06/23 1037 08/08/23 1424   08/06/23 1100  piperacillin -tazobactam (ZOSYN ) IVPB 3.375 g        3.375 g 12.5 mL/hr over 240 Minutes Intravenous Every 8 hours 08/06/23 1051 08/18/23 1259   07/30/23 1800  piperacillin -tazobactam (ZOSYN ) IVPB 3.375 g        3.375 g 12.5 mL/hr over 240 Minutes Intravenous Every 8 hours 07/30/23 1332 08/04/23 2359   07/30/23 1400  piperacillin -tazobactam (ZOSYN ) IVPB 3.375 g  Status:  Discontinued        3.375 g 100 mL/hr over 30 Minutes Intravenous Every 8 hours 07/30/23 1329 07/30/23 1332   07/30/23 1015  piperacillin -tazobactam (ZOSYN ) IVPB 3.375 g        3.375 g 100 mL/hr over 30 Minutes Intravenous  Once 07/30/23 1013 07/30/23 1311       Assessment/Plan: S/p ex lap with Hartmann's procedure for stercoral colitis with ischemia and perforation with feculent peritonitis, Dr. Polly 6/22  - CT scan (7/5) showed collection remains sizable and has slightly enlarged compared with the previous study. This abuts the rectal suture line and a leak in this area cannot be excluded. IR repositioned drains, both purulent. Cx with proteus mirabilis and enterococcus  - CT scan 7/17 shows dilated stomach and  SBO, some improvement in collections  - WBC 10 - per CT findings consistent with pSBO, having NG output and ostomy output, X-ray today looks improved. NG clamp trials starting today - Continue to mobilize as tolerated. - Continue BID dressing changes to midline - Dakins added today for exam finding of pseudomonas colonization. - Continue following of WOC - Pulm toilet - BID PPI    FEN: NPO and NGT, TPN, cepacol lozenges for throat pain from NGT VTE: Lovenox  ID: Unasyn   LOS: 27 days    Almarie Pringle, [facility] Please see Amion for pager number during day hours 7:00am-4:30pm       08/26/2023

## 2023-08-26 NOTE — Progress Notes (Signed)
 NGT reconnected to LIWS at this time.  Patient  tolerated clamping trial well with only minor nausea that was relieved by antiemetic.

## 2023-08-26 NOTE — Progress Notes (Signed)
 PROGRESS NOTE        PATIENT DETAILS Name: Angela [name] Age: 73 y.o. Sex: female Date of Birth: 18-Oct-1950 Admit Date: 07/30/2023 Admitting Physician Maximino DELENA Sharps, MD ERE:[id number], [name], DO  Brief Summary: Patient is a 73 y.o.  female with history of HTN, HLD who presented with worsening abdominal pain-patient was found to have sigmoid perforation with feculent peritonitis in the setting of stercoral colitis.  Patient underwent sigmoidectomy with end colostomy on 6/22-unfortunately postop course complicated by ileus and pelvic residual abscesses requiring IR drain placement.    Significant events: 6/21>> evaluated in the ED for abdominal pain-found to have left mid ureteral stone-9 mm with hydronephrosis-and large volume of stool-managed with supportive care and discharged home. 6/22>> worsening abdominal pain-repeat CT scan consistent with bowel perforation-surgery consult-admit to [facility]. 6/29>> CT abdomen with pelvic abscess. 6/30>> CT-guided drain and pelvic abscess by IR 7/04>> vomiting-recurrent ileus-NG tube reinserted 7/05>> TNA started-repeat CT abdomen increased size of abscess. 7/07>> IR placed RLQ drain and right transgluteal pelvic drain. 7/10>> NGT removed  Significant studies: 6/22>> CT renal stone study: Interval development of intraperitoneal free air-moderate volume free fluid-consistent with bowel perforation, persistent large volume stool with dilated sigmoid colon.  Similar location of left ureteral stone-9 mm. 6/29>> CT abdomen/pelvis: S/p partial colectomy-LLQ ostomy-complex free fluid with multiple foci of air-most pronounced in the surgical bed-possibility of superimposed infection or bowel leak cannot be excluded. 6/30>> echo: EF 60-65%. 7/05>> CT abdomen/pelvis: Previous collection with pigtail has increased in size, other less well-defined but enlarging peritoneal fluid collections.  Persistent-partially obstructing 11 mm calculus  in the distal left ureter.  Persistent fluid loops of dilated small bowel-likely ileus.  Significant microbiology data: 6/22>> urine culture: No growth 6/22>> blood culture: No growth 6/30>> pelvic abscess culture: Gram-negative rods/gram-positive cocci-mixed anaerobic flora. 7/07>> Pelvic abscess culture:moderate proteus and enterococcus  Procedures: 6/22>> sigmoidectomy with end colostomy.  By general surgeon Dr. Polly 6/30>> CT-guided drain and pelvic abscess by IR 08/19/2023 1.  Cystoscopy with left ureteral stent placement. 2. left retrograde pyelogram with interpretation. 3. Fluoroscopy <1 hour with intraoperative interpretation by urologist Dr. Arlyss Foot  Consults: General Surgery IR Urology  Subjective: Patient in bed, appears comfortable, denies any headache, no fever, no chest pain or pressure, no shortness of breath , no abdominal pain. No new focal weakness.    Objective: Vitals: Blood pressure 135/69, pulse 75, temperature 97.9 F (36.6 C), temperature source Oral, resp. rate 16, height 5' 4 (1.626 m), weight 58.1 kg, SpO2 96%.   Exam:  Awake Alert, No new F.N deficits, Normal affect [place].AT,PERRAL Supple Neck, No JVD,   Symmetrical Chest wall movement, Good air movement bilaterally, CTAB RRR,No Gallops, Rubs or new Murmurs,  Abdomen is more distended on 08/24/2023. 2 abdominal drains, colostomy bag with brown stool, right arm PICC line, NG tube in place.   Assessment/Plan:  Sepsis secondary to stercoral colitis with ischemia/perforation of sigmoid colon with feculent peritonitis-s/p sigmoid colectomy/ostomy on 6/22-complicated by postoperative ileus and then residual pelvic abscess requiring CT-guided drain placement by on 6/30. Ileus seems to be slowly improving-multiple output in ostomy bag overnight.  NG tube has been removed. Remains n.p.o. for ureteral stent placement later today. Remains on Zosyn  >> Unasyn  per General Surgery. General Surgery  following-for management of this problem to general surgery, CT abdomen pelvis being repeated on 08/23/2023  suggestive of small bowel obstruction, continue TNA, has NG tube to intermittent suction, x-ray morning of 08/25/2023 shows repeat small bowel dilation, general surgery following defer management to general surgery  Hydronephrosis secondary to distal left ureteral stone Urology following-with recommendations for supportive care for now-now that her medical problems have more or less stabilized underwent left ureteral stent placement by urology Dr. Arlyss Foot on 08/18/2023  Hospital delirium Secondary to acute illness/narcotics Now resolved  Normocytic anemia Due to acute illness,+/- coffee-ground emesis on 7/7 Hemoglobin stable although low, type screen done will continue to monitor transfuse if she drops consistently close to hemoglobin of 7.  Thrombocytosis Likely reactive Supportive care  Coffee-ground NG tube emesis Seems to have resolved-no longer coffee-ground emesis-bile appearing fluid in canister. Continue PPI Hb stable, staying around 7.5, from multiple blood draws and some perioperative blood loss along with him dilution.  Patient and family agreeable for transfusion if needed.  Hypomagnesemia/hypophosphatemia/hypokalemia Secondary to NG tube suctioning Resolved.  HTN BP stable-all oral antihypertensives on hold due to n.p.o. status Continue with as needed IV hydralazine  and metoprolol .   Lower extremity edema Secondary to hypoalbuminemia/IV fluid resuscitation in the setting of acute illness Echo stable-UA negative for proteinuria Will start diuretics once she is a bit more stable.  Chronic idiopathic constipation Bowel function recovering-brown stools Once more stable-will need to be placed on a bowel regimen.  Odynophagia secondary to oral/aphthous ulcers Improved on Valtrex and Magic mouthwash Supportive care  Glaucoma Timolol   eyedrops  Insomnia Appears to be primarily due to pain-anxiety Schedule trazodone  at night If no response-use IV Benadryl   Debility/deconditioning PT OT eval-Home health recommended.  Code status:   Code Status: Full Code   DVT Prophylaxis: enoxaparin  (LOVENOX ) injection 40 mg Start: 08/17/23 1000 Place TED hose Start: 08/09/23 1000 SCDs Start: 07/30/23 1953   Family Communication: Family member bedside on 08/22/2023, 08/23/2023, 08/24/2023, 08/25/2023, 08/26/2023   Disposition Plan: Status is: Inpatient Remains inpatient appropriate because: Severity of illness   Planned Discharge Destination:Home health   Diet: Diet Order             Diet NPO time specified Except for: Sips with Meds, Ice Chips  Diet effective midnight                    Data Review:   Inpatient Medications  Scheduled Meds:  acetaminophen   1,000 mg Oral Q6H   bisacodyl   10 mg Rectal Daily   Chlorhexidine  Gluconate Cloth  6 each Topical Daily   enoxaparin  (LOVENOX ) injection  40 mg Subcutaneous Q24H   feeding supplement  237 mL Oral BID BM   ferrous sulfate   325 mg Oral BID WC   folic acid   1 mg Oral Daily   latanoprost   1 drop Both Eyes QHS   methocarbamol   500 mg Oral TID   Or   methocarbamol  (ROBAXIN ) injection  500 mg Intravenous TID   pantoprazole  (PROTONIX ) IV  40 mg Intravenous Q12H   polyethylene glycol  17 g Oral BID   sodium chloride  flush  5 mL Intracatheter Q8H   timolol   1 drop Both Eyes BID   traZODone   50 mg Oral QHS   Continuous Infusions:  ampicillin -sulbactam (UNASYN ) IV 3 g (08/26/23 0615)   dextrose  70 mL/hr at 08/26/23 0735   magnesium  sulfate bolus IVPB     potassium chloride      TPN ADULT (ION)     PRN Meds:.albuterol , diphenhydrAMINE , hydrALAZINE , HYDROmorphone  (DILAUDID ) injection, melatonin, menthol -cetylpyridinium, metoprolol  tartrate, ondansetron  **  OR** ondansetron  (ZOFRAN ) IV, mouth rinse, oxyCODONE , phenol, sodium chloride  flush  DVT  Prophylaxis  enoxaparin  (LOVENOX ) injection 40 mg Start: 08/17/23 1000 Place TED hose Start: 08/09/23 1000 SCDs Start: 07/30/23 1953   Recent Labs  Lab 08/22/23 0400 08/23/23 0349 08/24/23 0442 08/25/23 0339 08/26/23 0430  WBC 13.1* 10.6* 9.8 10.8* 10.8*  HGB 7.7* 7.4* 7.2* 7.4* 7.6*  HCT 24.9* 23.3* 22.7* 23.5* 23.8*  PLT 462* 445* 443* 463* 482*  MCV 91.5 91.0 90.1 90.7 90.8  MCH 28.3 28.9 28.6 28.6 29.0  MCHC 30.9 31.8 31.7 31.5 31.9  RDW 16.8* 17.1* 17.2* 17.6* 18.1*  LYMPHSABS 2.0 1.9 1.7 1.8 1.6  MONOABS 0.7 0.6 0.5 0.6 0.5  EOSABS 0.3 0.3 0.2 0.2 0.2  BASOSABS 0.1 0.1 0.1 0.0 0.1    Recent Labs  Lab 08/20/23 0349 08/21/23 0317 08/22/23 0400 08/23/23 0349 08/24/23 0442 08/25/23 0339 08/26/23 0430  NA 138 137 138 138 136 139 140  K 3.8 3.7 4.2 3.8 3.5 4.0 3.6  CL 103 104 103 102 105 107 107  CO2 28 27 26 27 25 25 27   ANIONGAP 7 6 9 9 6 7 6   GLUCOSE 109* 113* 94 90 106* 98 109*  BUN 21 18 22 22 22 18 18   CREATININE 0.50 0.47 0.51 0.52 0.50 0.53 0.44  AST 17 19 17 18  14* 13* 14*  ALT 14 14 14 15 15 13 11   ALKPHOS 73 84 89 75 75 73 67  BILITOT 0.4 0.3 0.3 0.3 0.2 0.2 0.2  ALBUMIN  1.7* 1.7* 1.9* 1.7* 1.6* 1.6* 1.6*  CRP 1.3* 1.0* 1.0*  --  1.6* 1.0*  --   PROCALCITON <0.10 <0.10 <0.10 <0.10 <0.10 <0.10 <0.10  MG 1.8 2.0 1.9 2.0 1.7 1.9 1.8  PHOS 4.0 3.5 3.4 3.7 2.8 2.8 3.3  CALCIUM  8.4* 8.0* 8.5* 8.1* 7.7* 7.8* 7.9*      Recent Labs  Lab 08/20/23 0349 08/21/23 0317 08/22/23 0400 08/23/23 0349 08/24/23 0442 08/25/23 0339 08/26/23 0430  CRP 1.3* 1.0* 1.0*  --  1.6* 1.0*  --   PROCALCITON <0.10 <0.10 <0.10 <0.10 <0.10 <0.10 <0.10  MG 1.8 2.0 1.9 2.0 1.7 1.9 1.8  CALCIUM  8.4* 8.0* 8.5* 8.1* 7.7* 7.8* 7.9*    --------------------------------------------------------------------------------------------------------------- Lab Results  Component Value Date   CHOL 183 11/30/2021   HDL 62.70 11/30/2021   LDLCALC 101 (H) 11/30/2021   LDLDIRECT 124.0  10/23/2020   TRIG 152 (H) 08/21/2023   CHOLHDL 3 11/30/2021    Lab Results  Component Value Date   HGBA1C 6.0 11/30/2021   No results for input(s): TSH, T4TOTAL, FREET4, T3FREE, THYROIDAB in the last 72 hours. No results for input(s): VITAMINB12, FOLATE, FERRITIN, TIBC, IRON, RETICCTPCT in the last 72 hours. ------------------------------------------------------------------------------------------------------------------ Cardiac Enzymes No results for input(s): CKMB, TROPONINI, MYOGLOBIN in the last 168 hours.  Invalid input(s): CK  Radiology Reports  DG Abd Portable 1V Result Date: 08/26/2023 CLINICAL DATA:  73 year old female status post exploratory laparotomy. Abdominal pain. Small-bowel obstruction. EXAM: PORTABLE ABDOMEN - 1 VIEW COMPARISON:  Abdominal radiographs yesterday, postoperative CT Abdomen and Pelvis 08/23/2023. FINDINGS: Portable AP supine views at 0629 hours. Satisfactory enteric tube termination in the stomach now, side hole at the gastric cardia level. Stable left double-J ureteral stent. Stable right pelvic pigtail drain. Bowel-gas pattern appears improved compared to the scout view on 08/23/2023. Oral contrast in the right colon similar to the radiographs yesterday. Stable, negative lung bases. Stable visualized osseous structures. IMPRESSION: 1. Satisfactory enteric tube  placement into the stomach. Otherwise stable lines and tubes. 2. Improving bowel gas pattern since the CT on 08/23/2023. Stable oral contrast in the right colon. Electronically Signed   By: VEAR Hurst M.D.   On: 08/26/2023 07:19   IR INJECT INDWELLING DRAINAGE CATHETER Result Date: 08/25/2023 CLINICAL DATA:  Abscess drain follow-up. 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 RIGHT lower quadrant and transgluteal abscess drain placement on 08/14/2023 and 08/15/2023. EXAM: IR  INJECT/THERA/INC NEEDLE/CATH/PLC EPI/LUMB/SAC W/IMG COMPARISON:  CT AP, 08/23/2023. CONTRAST:  10 mL Isovue -300-administered via the existing percutaneous drain. FLUOROSCOPY: Radiation Exposure Index and estimated peak skin dose (PSD); Reference air kerma (RAK), 0.7 mGy. TECHNIQUE: The RIGHT lower quadrant drain up was removed prior to fluoroscopic evaluation. The patient was positioned prone on the fluoroscopy table. A preprocedural spot fluoroscopic image was obtained of the pelvis and the existing percutaneous drainage catheter. Multiple spot fluoroscopic and radiographic images were obtained following the injection of a small amount of contrast via the existing RIGHT transgluteal approach percutaneous drainage catheter. Procedure assisted by Tawni Lecher, IR RT under direct supervision of Thom Hall, MD FINDINGS: No significant residual abscess collection or obvious fistulous communication. IMPRESSION: 1. No significant residual abscess collection or obvious fistulous communication. 2. The RIGHT lower quadrant drainage catheter was removed. 3. The RIGHT transgluteal approach drainage catheter was left in place, secondary to residual output volume. No significant abscess collection or obvious fistulous communication noted on fluoroscopic evaluation. Thom Hall, MD [facility] Radiology Electronically Signed   By: Thom Hall M.D.   On: 08/25/2023 09:36   DG Abd Portable 1V Result Date: 08/25/2023 EXAM: 1 VIEW XRAY OF THE ABDOMEN 08/25/2023 06:38:00 AM COMPARISON: 08/24/2023 CLINICAL HISTORY: [id number] SBO (small bowel obstruction) (HCC) [id number]. SBO. Pt complains of abdominal pain FINDINGS: BOWEL: Persistent dilated loops of small bowel noted in the left lower quadrant of the abdomen measuring up to 4.1 cm previously 4.0 cm. SOFT TISSUES: No abnormal calcifications or opaque urinary calculi. There is a right lower quadrant approach pigtail drainage  catheter with distal end in the right paramidline pelvis. BONES: No acute osseous abnormality. LINES AND TUBES: There is an enteric tube with tip approximately 6.2 cm below the GE junction. The side port is 1.5 cm above the GE junction. Recommend advancing enteric tube. Left-sided nephroureteral stent is again noted. IMPRESSION: 1. Persistent dilated loops of small bowel in the left lower quadrant, measuring up to 4.1 cm, previously 4.0 cm, consistent with small bowel obstruction. 2. Enteric tube with tip approximately 6.2 cm below the GE junction and side port 1.5 cm above the GE junction. Recommend advancing enteric tube. 3. Left-sided nephroureteral stent and right lower quadrant approach pigtail drainage catheter with distal end in the right paramidline pelvis. Electronically signed by: Waddell Calk MD 08/25/2023 07:19 AM EDT RP Workstation: [id number]   DG Abd Portable 1V Result Date: 08/24/2023 CLINICAL DATA:  [id number] Encounter for nasogastric (NG) tube placement [id number] EXAM: PORTABLE ABDOMEN - 1 VIEW COMPARISON:  None Available. FINDINGS: Esophagogastric tube terminates at the GE junction. Nonobstructive bowel gas pattern. No pneumoperitoneum. Partially visualized left ureteral stent. No acute fracture or destructive lesion. The lung bases are clear. IMPRESSION: Esophagogastric tube terminates in the region of the GE junction. The last side hole is in the distal esophagus and continued advancement is recommended. Electronically Signed   By: Rogelia Myers M.D.   On: 08/24/2023 11:48  Signature  -   Lavada Stank M.D on 08/26/2023 at 9:01 AM   -  To page go to [email or web address]

## 2023-08-27 ENCOUNTER — Inpatient Hospital Stay (HOSPITAL_COMMUNITY)

## 2023-08-27 DIAGNOSIS — K631 Perforation of intestine (nontraumatic): Secondary | ICD-10-CM | POA: Diagnosis not present

## 2023-08-27 LAB — COMPREHENSIVE METABOLIC PANEL WITH GFR
ALT: 12 U/L (ref 0–44)
AST: 14 U/L — ABNORMAL LOW (ref 15–41)
Albumin: 1.6 g/dL — ABNORMAL LOW (ref 3.5–5.0)
Alkaline Phosphatase: 65 U/L (ref 38–126)
Anion gap: 7 (ref 5–15)
BUN: 17 mg/dL (ref 8–23)
CO2: 27 mmol/L (ref 22–32)
Calcium: 8 mg/dL — ABNORMAL LOW (ref 8.9–10.3)
Chloride: 105 mmol/L (ref 98–111)
Creatinine, Ser: 0.46 mg/dL (ref 0.44–1.00)
GFR, Estimated: >60 mL/min (ref >60)
Glucose, Bld: 100 mg/dL — ABNORMAL HIGH (ref 70–99)
Potassium: 3.8 mmol/L (ref 3.5–5.1)
Sodium: 139 mmol/L (ref 135–145)
Total Bilirubin: 0.2 mg/dL (ref 0.0–1.2)
Total Protein: 4.7 g/dL — ABNORMAL LOW (ref 6.5–8.1)

## 2023-08-27 LAB — PREPARE RBC (CROSSMATCH)

## 2023-08-27 LAB — CBC WITH DIFFERENTIAL/PLATELET
Abs Immature Granulocytes: 0.07 K/uL (ref 0.00–0.07)
Basophils Absolute: 0.1 K/uL (ref 0.0–0.1)
Basophils Relative: 1 %
Eosinophils Absolute: 0.3 K/uL (ref 0.0–0.5)
Eosinophils Relative: 4 %
HCT: 21.9 % — ABNORMAL LOW (ref 36.0–46.0)
Hemoglobin: 7 g/dL — ABNORMAL LOW (ref 12.0–15.0)
Immature Granulocytes: 1 %
Lymphocytes Relative: 24 %
Lymphs Abs: 2 K/uL (ref 0.7–4.0)
MCH: 29 pg (ref 26.0–34.0)
MCHC: 32 g/dL (ref 30.0–36.0)
MCV: 90.9 fL (ref 80.0–100.0)
Monocytes Absolute: 0.4 K/uL (ref 0.1–1.0)
Monocytes Relative: 6 %
Neutro Abs: 5.2 K/uL (ref 1.7–7.7)
Neutrophils Relative %: 64 %
Platelets: 417 K/uL — ABNORMAL HIGH (ref 150–400)
RBC: 2.41 MIL/uL — ABNORMAL LOW (ref 3.87–5.11)
RDW: 18.3 % — ABNORMAL HIGH (ref 11.5–15.5)
WBC: 8 K/uL (ref 4.0–10.5)
nRBC: 0 % (ref 0.0–0.2)

## 2023-08-27 LAB — GLUCOSE, CAPILLARY
Glucose-Capillary: 106 mg/dL — ABNORMAL HIGH (ref 70–99)
Glucose-Capillary: 108 mg/dL — ABNORMAL HIGH (ref 70–99)
Glucose-Capillary: 97 mg/dL (ref 70–99)

## 2023-08-27 LAB — PHOSPHORUS: Phosphorus: 3.4 mg/dL (ref 2.5–4.6)

## 2023-08-27 LAB — MAGNESIUM: Magnesium: 1.9 mg/dL (ref 1.7–2.4)

## 2023-08-27 LAB — PROCALCITONIN: Procalcitonin: <0.1 ng/mL

## 2023-08-27 LAB — C-REACTIVE PROTEIN: CRP: 0.9 mg/dL (ref <1.0)

## 2023-08-27 MED ORDER — TRAVASOL 10 % IV SOLN
INTRAVENOUS | Status: AC
  Filled 2023-08-27: qty 890.4

## 2023-08-27 MED ORDER — POTASSIUM CHLORIDE 10 MEQ/50ML IV SOLN
10.0000 meq | INTRAVENOUS | Status: AC
  Administered 2023-08-27 (×6): 10 meq via INTRAVENOUS
  Filled 2023-08-27 (×6): qty 50

## 2023-08-27 MED ORDER — SODIUM CHLORIDE 0.9% IV SOLUTION: Freq: Once | INTRAVENOUS | Status: DC

## 2023-08-27 MED ORDER — MAGNESIUM SULFATE 4 GM/100ML IV SOLN
4.0000 g | Freq: Once | INTRAVENOUS | Status: AC
  Administered 2023-08-27: 4 g via INTRAVENOUS
  Filled 2023-08-27: qty 100

## 2023-08-27 NOTE — Plan of Care (Signed)

## 2023-08-27 NOTE — Progress Notes (Signed)
 PROGRESS NOTE        PATIENT DETAILS Name: Angela [name] Age: 73 y.o. Sex: female Date of Birth: 06-15-1950 Admit Date: 07/30/2023 Admitting Physician Maximino DELENA Sharps, MD ERE:[id number], [name], DO  Brief Summary: Patient is a 73 y.o.  female with history of HTN, HLD who presented with worsening abdominal pain-patient was found to have sigmoid perforation with feculent peritonitis in the setting of stercoral colitis.  Patient underwent sigmoidectomy with end colostomy on 6/22-unfortunately postop course complicated by ileus and pelvic residual abscesses requiring IR drain placement.    Significant events: 6/21>> evaluated in the ED for abdominal pain-found to have left mid ureteral stone-9 mm with hydronephrosis-and large volume of stool-managed with supportive care and discharged home. 6/22>> worsening abdominal pain-repeat CT scan consistent with bowel perforation-surgery consult-admit to [facility]. 6/29>> CT abdomen with pelvic abscess. 6/30>> CT-guided drain and pelvic abscess by IR 7/04>> vomiting-recurrent ileus-NG tube reinserted 7/05>> TNA started-repeat CT abdomen increased size of abscess. 7/07>> IR placed RLQ drain and right transgluteal pelvic drain. 7/10>> NGT removed  Significant studies: 6/22>> CT renal stone study: Interval development of intraperitoneal free air-moderate volume free fluid-consistent with bowel perforation, persistent large volume stool with dilated sigmoid colon.  Similar location of left ureteral stone-9 mm. 6/29>> CT abdomen/pelvis: S/p partial colectomy-LLQ ostomy-complex free fluid with multiple foci of air-most pronounced in the surgical bed-possibility of superimposed infection or bowel leak cannot be excluded. 6/30>> echo: EF 60-65%. 7/05>> CT abdomen/pelvis: Previous collection with pigtail has increased in size, other less well-defined but enlarging peritoneal fluid collections.  Persistent-partially obstructing 11 mm calculus  in the distal left ureter.  Persistent fluid loops of dilated small bowel-likely ileus.  Significant microbiology data: 6/22>> urine culture: No growth 6/22>> blood culture: No growth 6/30>> pelvic abscess culture: Gram-negative rods/gram-positive cocci-mixed anaerobic flora. 7/07>> Pelvic abscess culture:moderate proteus and enterococcus  Procedures: 6/22>> sigmoidectomy with end colostomy.  By general surgeon Dr. Polly 6/30>> CT-guided drain and pelvic abscess by IR 08/19/2023 1.  Cystoscopy with left ureteral stent placement. 2. left retrograde pyelogram with interpretation. 3. Fluoroscopy <1 hour with intraoperative interpretation by urologist Dr. Arlyss Foot  Consults: General Surgery IR Urology  Subjective: Patient in bed, appears comfortable, denies any headache, no fever, no chest pain or pressure, no shortness of breath , no abdominal pain. No focal weakness.   Objective: Vitals: Blood pressure (!) 145/83, pulse 76, temperature 97.8 F (36.6 C), temperature source Oral, resp. rate 16, height 5' 4 (1.626 m), weight 59.1 kg, SpO2 96%.   Exam:  Awake Alert, No new F.N deficits, Normal affect [place].AT,PERRAL Supple Neck, No JVD,   Symmetrical Chest wall movement, Good air movement bilaterally, CTAB RRR,No Gallops, Rubs or new Murmurs,  Abdomen is more distended on 08/24/2023. 2 abdominal drains, colostomy bag with brown stool, right arm PICC line, NG tube in place.   Assessment/Plan:  Sepsis secondary to stercoral colitis with ischemia/perforation of sigmoid colon with feculent peritonitis-s/p sigmoid colectomy/ostomy on 6/22-complicated by postoperative ileus and then residual pelvic abscess requiring CT-guided drain placement by on 6/30. Ileus seems to be slowly improving-multiple output in ostomy bag overnight.  NG tube has been removed. Remains n.p.o. for ureteral stent placement later today. Remains on Zosyn  >> Unasyn  per General Surgery.  Discontinue once okay by  general surgery have quested them to look into it on 08/27/2023. General Surgery  following-for management of this problem to general surgery, CT abdomen pelvis being repeated on 08/23/2023 suggestive of small bowel obstruction, continue TNA, has NG tube to intermittent suction, clinically on 08/27/2023 abdominal distention and x-ray show improvement, clinically she is feeling better as well.  Again defer management of this issue to general surgery  Hydronephrosis secondary to distal left ureteral stone Urology following-with recommendations for supportive care for now-now that her medical problems have more or less stabilized underwent left ureteral stent placement by urology Dr. Arlyss Foot on 08/18/2023  Hospital delirium Secondary to acute illness/narcotics Now resolved  Normocytic anemia Due to acute illness,+/- coffee-ground emesis on 7/7 Hemoglobin stable although low, type screen done will continue to monitor transfuse if she drops consistently close to hemoglobin of 7.  Thrombocytosis Likely reactive Supportive care  Coffee-ground NG tube emesis Seems to have resolved-no longer coffee-ground emesis-bile appearing fluid in canister. Continue PPI Hb stable, staying around 7.5, from multiple blood draws and some perioperative blood loss along with him dilution.  Patient and family agreeable for transfusion if needed.  Hypomagnesemia/hypophosphatemia/hypokalemia Secondary to NG tube suctioning Resolved.  HTN BP stable-all oral antihypertensives on hold due to n.p.o. status Continue with as needed IV hydralazine  and metoprolol .   Lower extremity edema Secondary to hypoalbuminemia/IV fluid resuscitation in the setting of acute illness Echo stable-UA negative for proteinuria Will start diuretics once she is a bit more stable.  Chronic idiopathic constipation Bowel function recovering-brown stools Once more stable-will need to be placed on a bowel regimen.  Odynophagia secondary  to oral/aphthous ulcers Improved on Valtrex and Magic mouthwash Supportive care  Glaucoma Timolol  eyedrops  Insomnia Appears to be primarily due to pain-anxiety Schedule trazodone  at night If no response-use IV Benadryl   Debility/deconditioning PT OT eval-Home health recommended.  Code status:   Code Status: Full Code   DVT Prophylaxis: Place TED hose Start: 08/27/23 0539 enoxaparin  (LOVENOX ) injection 40 mg Start: 08/17/23 1000 Place TED hose Start: 08/09/23 1000 SCDs Start: 07/30/23 1953   Family Communication: Family member bedside on 08/22/2023, 08/23/2023, 08/24/2023, 08/25/2023, 08/26/2023   Disposition Plan: Status is: Inpatient Remains inpatient appropriate because: Severity of illness   Planned Discharge Destination:Home health   Diet: Diet Order             Diet clear liquid Room service appropriate? Yes; Fluid consistency: Thin  Diet effective now                    Data Review:   Inpatient Medications  Scheduled Meds:  sodium chloride    Intravenous Once   acetaminophen   1,000 mg Oral Q6H   bisacodyl   10 mg Rectal Daily   Chlorhexidine  Gluconate Cloth  6 each Topical Daily   enoxaparin  (LOVENOX ) injection  40 mg Subcutaneous Q24H   feeding supplement  237 mL Oral BID BM   ferrous sulfate   325 mg Oral BID WC   folic acid   1 mg Oral Daily   latanoprost   1 drop Both Eyes QHS   methocarbamol   500 mg Oral TID   Or   methocarbamol  (ROBAXIN ) injection  500 mg Intravenous TID   pantoprazole  (PROTONIX ) IV  40 mg Intravenous Q12H   polyethylene glycol  17 g Oral BID   sodium chloride  flush  5 mL Intracatheter Q8H   sodium hypochlorite   Irrigation BID   timolol   1 drop Both Eyes BID   traZODone   50 mg Oral QHS   Continuous Infusions:  ampicillin -sulbactam (UNASYN ) IV 3 g (  08/27/23 0535)   magnesium  sulfate bolus IVPB 4 g (08/27/23 9177)   potassium chloride      TPN ADULT (ION) 70 mL/hr at 08/26/23 1723   TPN ADULT (ION)     PRN Meds:.albuterol ,  diphenhydrAMINE , hydrALAZINE , HYDROmorphone  (DILAUDID ) injection, melatonin, menthol -cetylpyridinium, metoprolol  tartrate, ondansetron  **OR** ondansetron  (ZOFRAN ) IV, mouth rinse, oxyCODONE , phenol, sodium chloride  flush  DVT Prophylaxis  Place TED hose Start: 08/27/23 0539 enoxaparin  (LOVENOX ) injection 40 mg Start: 08/17/23 1000 Place TED hose Start: 08/09/23 1000 SCDs Start: 07/30/23 1953   Recent Labs  Lab 08/23/23 0349 08/24/23 0442 08/25/23 0339 08/26/23 0430 08/27/23 0405  WBC 10.6* 9.8 10.8* 10.8* 8.0  HGB 7.4* 7.2* 7.4* 7.6* 7.0*  HCT 23.3* 22.7* 23.5* 23.8* 21.9*  PLT 445* 443* 463* 482* 417*  MCV 91.0 90.1 90.7 90.8 90.9  MCH 28.9 28.6 28.6 29.0 29.0  MCHC 31.8 31.7 31.5 31.9 32.0  RDW 17.1* 17.2* 17.6* 18.1* 18.3*  LYMPHSABS 1.9 1.7 1.8 1.6 2.0  MONOABS 0.6 0.5 0.6 0.5 0.4  EOSABS 0.3 0.2 0.2 0.2 0.3  BASOSABS 0.1 0.1 0.0 0.1 0.1    Recent Labs  Lab 08/22/23 0400 08/23/23 0349 08/24/23 0442 08/25/23 0339 08/26/23 0430 08/27/23 0405  NA 138 138 136 139 140 139  K 4.2 3.8 3.5 4.0 3.6 3.8  CL 103 102 105 107 107 105  CO2 26 27 25 25 27 27   ANIONGAP 9 9 6 7 6 7   GLUCOSE 94 90 106* 98 109* 100*  BUN 22 22 22 18 18 17   CREATININE 0.51 0.52 0.50 0.53 0.44 0.46  AST 17 18 14* 13* 14* 14*  ALT 14 15 15 13 11 12   ALKPHOS 89 75 75 73 67 65  BILITOT 0.3 0.3 0.2 0.2 0.2 0.2  ALBUMIN  1.9* 1.7* 1.6* 1.6* 1.6* 1.6*  CRP 1.0*  --  1.6* 1.0* 1.0* 0.9  PROCALCITON <0.10 <0.10 <0.10 <0.10 <0.10 <0.10  MG 1.9 2.0 1.7 1.9 1.8 1.9  PHOS 3.4 3.7 2.8 2.8 3.3 3.4  CALCIUM  8.5* 8.1* 7.7* 7.8* 7.9* 8.0*      Recent Labs  Lab 08/22/23 0400 08/23/23 0349 08/24/23 0442 08/25/23 0339 08/26/23 0430 08/27/23 0405  CRP 1.0*  --  1.6* 1.0* 1.0* 0.9  PROCALCITON <0.10 <0.10 <0.10 <0.10 <0.10 <0.10  MG 1.9 2.0 1.7 1.9 1.8 1.9  CALCIUM  8.5* 8.1* 7.7* 7.8* 7.9* 8.0*     --------------------------------------------------------------------------------------------------------------- Lab Results  Component Value Date   CHOL 183 11/30/2021   HDL 62.70 11/30/2021   LDLCALC 101 (H) 11/30/2021   LDLDIRECT 124.0 10/23/2020   TRIG 152 (H) 08/21/2023   CHOLHDL 3 11/30/2021    Lab Results  Component Value Date   HGBA1C 6.0 11/30/2021   No results for input(s): TSH, T4TOTAL, FREET4, T3FREE, THYROIDAB in the last 72 hours. No results for input(s): VITAMINB12, FOLATE, FERRITIN, TIBC, IRON, RETICCTPCT in the last 72 hours. ------------------------------------------------------------------------------------------------------------------ Cardiac Enzymes No results for input(s): CKMB, TROPONINI, MYOGLOBIN in the last 168 hours.  Invalid input(s): CK  Radiology Reports  DG Abd Portable 1V Result Date: 08/27/2023 CLINICAL DATA:  73 year old female status post exploratory laparotomy. Abdominal pain. Small-bowel obstruction. EXAM: PORTABLE ABDOMEN - 1 VIEW COMPARISON:  Abdominal radiographs yesterday and earlier. FINDINGS: Portable AP supine view at 0636 hours. Stable lines and tubes in the abdomen and pelvis. Negative lung bases. Stable visualized osseous structures. Oral contrast previously in the right colon has continued distally. Mild left abdominal small bowel gas use distension now, 3.7 cm. Bowel-gas pattern  remains improved compared to the CT scout view on 08/23/2023. no definite pneumoperitoneum on these supine views. IMPRESSION: 1. Large bowel oral contrast has continued distally. Mildly dilated left abdominal small bowel loops, but gas pattern still improved compared to recent CT Abdomen and Pelvis. 2. Stable lines and tubes. Electronically Signed   By: VEAR Hurst M.D.   On: 08/27/2023 06:59   DG Abd Portable 1V Result Date: 08/26/2023 CLINICAL DATA:  73 year old female status post exploratory laparotomy. Abdominal pain. Small-bowel  obstruction. EXAM: PORTABLE ABDOMEN - 1 VIEW COMPARISON:  Abdominal radiographs yesterday, postoperative CT Abdomen and Pelvis 08/23/2023. FINDINGS: Portable AP supine views at 0629 hours. Satisfactory enteric tube termination in the stomach now, side hole at the gastric cardia level. Stable left double-J ureteral stent. Stable right pelvic pigtail drain. Bowel-gas pattern appears improved compared to the scout view on 08/23/2023. Oral contrast in the right colon similar to the radiographs yesterday. Stable, negative lung bases. Stable visualized osseous structures. IMPRESSION: 1. Satisfactory enteric tube placement into the stomach. Otherwise stable lines and tubes. 2. Improving bowel gas pattern since the CT on 08/23/2023. Stable oral contrast in the right colon. Electronically Signed   By: VEAR Hurst M.D.   On: 08/26/2023 07:19   Signature  -   Lavada Stank M.D on 08/27/2023 at 10:08 AM   -  To page go to [email or web address]

## 2023-08-27 NOTE — Progress Notes (Addendum)
 PHARMACY - TOTAL PARENTERAL NUTRITION CONSULT NOTE  Indication: intolerance to feeding, recurrent ileus  Patient Measurements: Height: 5' 4 (162.6 cm) Weight: 59.1 kg (130 lb 4.7 oz) IBW/kg (Calculated) : 54.7 TPN AdjBW (KG): 61.2 Body mass index is 22.36 kg/m. Usual Weight: ~59 kg  Assessment:  72 YOF s/p exp lap with Hartmann's procedure for stercoral colitis with ischemia and perforation with feculent peritonitis on 6/22. Noted pt has been NPO/clear liquid/full liquid diet since after surgery with limited intake. Pt now with ileus on Xray (dilated loops of small bowel) and N/V with liquid diet so NGT replaced 7/4, now NPO and starting TPN. Pt at risk of refeeding with prolonged period of little po intake since admission.  Glucose / Insulin : no hx DM - CBGs < 180, off insulin  Electrolytes: K 3.8 up s/p replacement yesterday (goal >/= 4 for ileus), Mag 1.9 up s/p replacement yesterday (goal >/= 2 for ileus), others WNL Renal: SCr < 1, BUN WNL Hepatic: LFTs / tbili WNL, albumin  1.6, TG mildly elevated at 152 (7/13) Intake / Output; MIVF: UOP not reliably documented (600 mL in chart), drain 30 mL, colostomy 0 mL, stool 0 mL, 300 mL NGT out.  GI Imaging: 7/5 abd xray - persistent dilated loops of small bowel 7/5 CT - pelvic fluid collection slightly enlarged, suspicious for peritonitis, partially obstructing calculus in L ureter, ileus 7/16 CT small bowel obstruction 7/17 abd XR - SBO 7/19 abd XR - enteric tube in stomach, improving bowel gas pattern, oral contrast in R colon  7/20 Abd xr -  Large bowel oral contrast has continued distally. Mildly dilated left abdominal small bowel loops, but gas pattern still improved GI Surgeries / Procedures:  6/22 ex-lap with Hartmann's procedure for stercoral colitis with ischemia and perf with feculent peritonitis 7/8 CT-guided RLQ abd abscess and transgluteal pelvic drains [>472mL tan colored fluid] 7/10 NGT removed  7/11 NGT replaced  7/16  ureteral stent placement   Central access: PICC placed 08/12/23 TPN start date: 08/12/23  Nutritional Goals: Goal TPN 70 ml/hr will provide 89g AA, 1660 kCal   RD Estimated Needs Total Energy Estimated Needs: 1650-1850 kcals Total Protein Estimated Needs: 80-90 g Total Fluid Estimated Needs: >/= 1.7 L  Current Nutrition:  TPN  NGT clamp trials started 7/19    Plan: 60 mEq KCL and 4 g magnesium  IV again today  Continue TPN rate at 70 ml/hr to provide 100% of need Electrolytes in TPN: Na 140 mEq/L, K to 80 mEq/L, Ca 2 mEq/L, Mg 15 mEq/L (max), Phos 9 mmol/L, Cl:Ac 1:1  Add multivitamin trace elements to TPN  Monitor TPN labs on Mon/Thurs - labs in AM  Rankin Sams, PharmD, BCPS, BCCCP Clinical Pharmacist

## 2023-08-27 NOTE — Progress Notes (Signed)
 Patient ABD wet to dry wound dressing changed. Tolerated well. Patient tolerating TPN well. Patient has no c/o nausea NG tube has been clamped and removed from suction for several hours. Patient has taken her PO medications crushed in NG tube. Dilaudid  given for pain. No adverse change in patient condition. Will continue to monitor.

## 2023-08-27 NOTE — Progress Notes (Signed)
 Patient ID: Angela [name], female   DOB: October 12, 1950, 73 y.o.   MRN: [id number] 9 Days Post-Op    Subjective: NAEO. Tolerating intermittent clamp trials yesterday. No ostomy output in 24 hours.   ROS negative except as listed above. Objective: Vital signs in last 24 hours: Temp:  [97.8 F (36.6 C)-98.4 F (36.9 C)] 98 F (36.7 C) (07/20 1058) Pulse Rate:  [76-79] 76 (07/19 2047) BP: (139-155)/(62-83) 139/81 (07/20 1058) Weight:  [59.1 kg] 59.1 kg (07/20 0500) Last BM Date : 08/24/23  Intake/Output from previous day: 07/19 0701 - 07/20 0700 In: 5 [I.V.:5] Out: 930 [Urine:600; Emesis/NG output:300; Drains:30] Intake/Output this shift: No intake/output data recorded.  General appearance: alert and cooperative GI: soft, midline dressing c/d/i, ostomy pink with scant purulent output  NGT - 312mL/24h, mostly AM shift  JP RLQ - purulent    Lab Results: CBC  Recent Labs    08/26/23 0430 08/27/23 0405  WBC 10.8* 8.0  HGB 7.6* 7.0*  HCT 23.8* 21.9*  PLT 482* 417*   BMET Recent Labs    08/26/23 0430 08/27/23 0405  NA 140 139  K 3.6 3.8  CL 107 105  CO2 27 27  GLUCOSE 109* 100*  BUN 18 17  CREATININE 0.44 0.46  CALCIUM  7.9* 8.0*   PT/INR No results for input(s): LABPROT, INR in the last 72 hours. ABG No results for input(s): PHART, HCO3 in the last 72 hours.  Invalid input(s): PCO2, PO2  Studies/Results:   Anti-infectives: Anti-infectives (From admission, onward)    Start     Dose/Rate Route Frequency Ordered Stop   08/18/23 1800  Ampicillin -Sulbactam (UNASYN ) 3 g in sodium chloride  0.9 % 100 mL IVPB        3 g 200 mL/hr over 30 Minutes Intravenous Every 6 hours 08/18/23 0922     08/06/23 1130  acyclovir  (ZOVIRAX ) 200 MG capsule 400 mg  Status:  Discontinued        400 mg Oral 3 times daily 08/06/23 1037 08/08/23 1424   08/06/23 1100  piperacillin -tazobactam (ZOSYN ) IVPB 3.375 g        3.375 g 12.5 mL/hr over 240 Minutes Intravenous Every 8  hours 08/06/23 1051 08/18/23 1259   07/30/23 1800  piperacillin -tazobactam (ZOSYN ) IVPB 3.375 g        3.375 g 12.5 mL/hr over 240 Minutes Intravenous Every 8 hours 07/30/23 1332 08/04/23 2359   07/30/23 1400  piperacillin -tazobactam (ZOSYN ) IVPB 3.375 g  Status:  Discontinued        3.375 g 100 mL/hr over 30 Minutes Intravenous Every 8 hours 07/30/23 1329 07/30/23 1332   07/30/23 1015  piperacillin -tazobactam (ZOSYN ) IVPB 3.375 g        3.375 g 100 mL/hr over 30 Minutes Intravenous  Once 07/30/23 1013 07/30/23 1311       Assessment/Plan: S/p ex lap with Hartmann's procedure for stercoral colitis with ischemia and perforation with feculent peritonitis, Dr. Polly 6/22  - CT scan (7/5) showed collection remains sizable and has slightly enlarged compared with the previous study. This abuts the rectal suture line and a leak in this area cannot be excluded. IR repositioned drains, both purulent. Cx with proteus mirabilis and enterococcus  - CT scan 7/17 shows dilated stomach and SBO, some improvement in collections  - WBC 8.0 - per CT 7/16 findings consistent with pSBO, NGT was placed 7/17, had stool in colostomy on 7/18 and improving X-ray. Tolerated clamp trials 7/19. NG clamped again today and allow CLD/ensure. Hesitant to  remove NGT given history of it needing to be replaced and lack of bowel function in 24 hours. Will consider NGT removal in AM if tolerates CLD and gas/stool in ostomy bag. - 7/17 RLQ drain removed, R transgluteal drain left in place. - Continue BID dressing changes to midline - Dakins added 7/19 for exam finding of pseudomonas colonization. - Continue following of WOC - Pulm toilet - BID PPI    FEN: NPO and NGT, TPN, cepacol lozenges for throat pain from NGT VTE: Lovenox  ID: Unasyn ; leukocytosis resolved, drain in place in residual pelvic collection; ok to stop abx and follow clinically.   LOS: 28 days    Almarie Pringle, [facility] Surgery Please see  Amion for pager number during day hours 7:00am-4:30pm       08/27/2023

## 2023-08-28 ENCOUNTER — Inpatient Hospital Stay (HOSPITAL_COMMUNITY)

## 2023-08-28 DIAGNOSIS — K631 Perforation of intestine (nontraumatic): Secondary | ICD-10-CM | POA: Diagnosis not present

## 2023-08-28 LAB — TYPE AND SCREEN
ABO/RH(D): O POS
Antibody Screen: NEGATIVE
Unit division: 0
Unit division: 0

## 2023-08-28 LAB — PROCALCITONIN: Procalcitonin: <0.1 ng/mL

## 2023-08-28 LAB — CBC WITH DIFFERENTIAL/PLATELET
Abs Immature Granulocytes: 0.08 K/uL — ABNORMAL HIGH (ref 0.00–0.07)
Basophils Absolute: 0.1 K/uL (ref 0.0–0.1)
Basophils Relative: 1 %
Eosinophils Absolute: 0.3 K/uL (ref 0.0–0.5)
Eosinophils Relative: 2 %
HCT: 32.1 % — ABNORMAL LOW (ref 36.0–46.0)
Hemoglobin: 10.7 g/dL — ABNORMAL LOW (ref 12.0–15.0)
Immature Granulocytes: 1 %
Lymphocytes Relative: 11 %
Lymphs Abs: 1.3 K/uL (ref 0.7–4.0)
MCH: 30.1 pg (ref 26.0–34.0)
MCHC: 33.3 g/dL (ref 30.0–36.0)
MCV: 90.2 fL (ref 80.0–100.0)
Monocytes Absolute: 0.6 K/uL (ref 0.1–1.0)
Monocytes Relative: 5 %
Neutro Abs: 9.9 K/uL — ABNORMAL HIGH (ref 1.7–7.7)
Neutrophils Relative %: 80 %
Platelets: 471 K/uL — ABNORMAL HIGH (ref 150–400)
RBC: 3.56 MIL/uL — ABNORMAL LOW (ref 3.87–5.11)
RDW: 17.2 % — ABNORMAL HIGH (ref 11.5–15.5)
WBC: 12.2 K/uL — ABNORMAL HIGH (ref 4.0–10.5)
nRBC: 0 % (ref 0.0–0.2)

## 2023-08-28 LAB — GLUCOSE, CAPILLARY
Glucose-Capillary: 103 mg/dL — ABNORMAL HIGH (ref 70–99)
Glucose-Capillary: 107 mg/dL — ABNORMAL HIGH (ref 70–99)
Glucose-Capillary: 110 mg/dL — ABNORMAL HIGH (ref 70–99)
Glucose-Capillary: 115 mg/dL — ABNORMAL HIGH (ref 70–99)
Glucose-Capillary: 116 mg/dL — ABNORMAL HIGH (ref 70–99)

## 2023-08-28 LAB — COMPREHENSIVE METABOLIC PANEL WITH GFR
ALT: 12 U/L (ref 0–44)
AST: 14 U/L — ABNORMAL LOW (ref 15–41)
Albumin: 1.9 g/dL — ABNORMAL LOW (ref 3.5–5.0)
Alkaline Phosphatase: 67 U/L (ref 38–126)
Anion gap: 9 (ref 5–15)
BUN: 17 mg/dL (ref 8–23)
CO2: 26 mmol/L (ref 22–32)
Calcium: 8.2 mg/dL — ABNORMAL LOW (ref 8.9–10.3)
Chloride: 105 mmol/L (ref 98–111)
Creatinine, Ser: 0.41 mg/dL — ABNORMAL LOW (ref 0.44–1.00)
GFR, Estimated: >60 mL/min (ref >60)
Glucose, Bld: 99 mg/dL (ref 70–99)
Potassium: 3.8 mmol/L (ref 3.5–5.1)
Sodium: 140 mmol/L (ref 135–145)
Total Bilirubin: 0.5 mg/dL (ref 0.0–1.2)
Total Protein: 5.1 g/dL — ABNORMAL LOW (ref 6.5–8.1)

## 2023-08-28 LAB — BPAM RBC
Blood Product Expiration Date: 202508112359
Blood Product Expiration Date: 202508162359
ISSUE DATE / TIME: 202507201254
ISSUE DATE / TIME: 202507201559
Unit Type and Rh: 5100
Unit Type and Rh: 5100

## 2023-08-28 LAB — TRIGLYCERIDES: Triglycerides: 87 mg/dL (ref <150)

## 2023-08-28 MED ORDER — TRAVASOL 10 % IV SOLN
INTRAVENOUS | Status: AC
  Filled 2023-08-28: qty 890.4

## 2023-08-28 MED ORDER — CARVEDILOL 3.125 MG PO TABS
3.1250 mg | ORAL_TABLET | Freq: Two times a day (BID) | ORAL | Status: DC
  Administered 2023-08-28 – 2023-08-29 (×3): 3.125 mg via ORAL
  Filled 2023-08-28 (×3): qty 1

## 2023-08-28 MED ORDER — METOPROLOL TARTRATE 5 MG/5ML IV SOLN: 2.5000 mg | Freq: Four times a day (QID) | INTRAVENOUS | Status: DC | PRN

## 2023-08-28 NOTE — Progress Notes (Signed)
 Occupational Therapy Treatment Patient Details Name: Angela [name] MRN: [id number] DOB: Jan 29, 1951 Today's Date: 08/28/2023   History of present illness 73 yo female presents to ED on 6/21 with abdominal pain. Workup for sepsis secondary to bowel perforation. S/p sigmoidectomy with end colostomy 6/22. 6/29 increased WBC and abd pain, s/p CT guided drain placement for pelvic abscess 6/30, repeat drain placement RLQ and R transgluteal pelvic drain 7/7. Pt also with hydroureteronephrosis secondary to L ureteral stone, s/p L ureteral stent placement and L retrograde pyelogram 7/11. PMH includes  HTN, HLD, glaucoma and prior tobacco abuse.   OT comments  Patient states she is feeing better and demonstrating good gains. Patient able to get to EOB without assistance and was able to perform mobility in room and hallway with supervision. Patient performed toilet transfer with supervision using RW and exited bathroom without RW using IV pole for support. Patient able to perform toilet hygiene seated and required min assist with under wear. Discharge recommendations continue to be appropriate. Acute OT to continue to follow to address established goals.       If plan is discharge home, recommend the following:  A little help with bathing/dressing/bathroom;Assistance with cooking/housework   Equipment Recommendations  Tub/shower seat    Recommendations for Other Services      Precautions / Restrictions Precautions Precautions: Fall;Other (comment) Recall of Precautions/Restrictions: Intact Precaution/Restrictions Comments: Multiple JP drains now along with ostomy bag. NG tube has been pulled Restrictions Weight Bearing Restrictions Per Provider Order: No       Mobility Bed Mobility Overal bed mobility: Modified Independent Bed Mobility: Supine to Sit, Sit to Supine           General bed mobility comments: able to perform self care tasks without assistance    Transfers Overall transfer  level: Needs assistance Equipment used: Rolling walker (2 wheels) Transfers: Sit to/from Stand Sit to Stand: Supervision           General transfer comment: cues for hand placement and safety     Balance Overall balance assessment: Needs assistance Sitting-balance support: Feet supported Sitting balance-Leahy Scale: Good     Standing balance support: During functional activity Standing balance-Leahy Scale: Good                             ADL either performed or assessed with clinical judgement   ADL Overall ADL's : Needs assistance/impaired     Grooming: Supervision/safety;Standing                   Toilet Transfer: Supervision/safety;Regular Toilet;Rolling walker (2 wheels)   Toileting- Clothing Manipulation and Hygiene: Minimal assistance;Sit to/from stand Toileting - Clothing Manipulation Details (indicate cue type and reason): required assistance with underwear     Functional mobility during ADLs: Supervision/safety;Rolling walker (2 wheels)      Extremity/Trunk Assessment              Vision       Perception     Praxis     Communication Communication Communication: No apparent difficulties   Cognition Arousal: Alert Behavior During Therapy: WFL for tasks assessed/performed                                 Following commands: Intact        Cueing   Cueing Techniques: Verbal cues  Exercises  Shoulder Instructions       General Comments VSS on RA    Pertinent Vitals/ Pain       Pain Assessment Pain Assessment: Faces Faces Pain Scale: Hurts a little bit Pain Location: abdomen Pain Descriptors / Indicators: Discomfort Pain Intervention(s): Monitored during session, Repositioned  Home Living                                          Prior Functioning/Environment              Frequency  Min 2X/week        Progress Toward Goals  OT Goals(current goals can now be  found in the care plan section)  Progress towards OT goals: Progressing toward goals  Acute Rehab OT Goals Patient Stated Goal: to go home OT Goal Formulation: With patient Time For Goal Achievement: 09/05/23 Potential to Achieve Goals: Good ADL Goals Pt Will Perform Grooming: with modified independence Pt Will Perform Lower Body Dressing: with supervision;sit to/from stand Pt Will Transfer to Toilet: with modified independence Pt Will Perform Toileting - Clothing Manipulation and hygiene: with modified independence Pt Will Perform Tub/Shower Transfer: with modified independence  Plan      Co-evaluation                 AM-PAC OT 6 Clicks Daily Activity     Outcome Measure   Help from another person eating meals?: None Help from another person taking care of personal grooming?: A Little Help from another person toileting, which includes using toliet, bedpan, or urinal?: A Little Help from another person bathing (including washing, rinsing, drying)?: A Little Help from another person to put on and taking off regular upper body clothing?: A Little Help from another person to put on and taking off regular lower body clothing?: A Little 6 Click Score: 19    End of Session Equipment Utilized During Treatment: Rolling walker (2 wheels)  OT Visit Diagnosis: Unsteadiness on feet (R26.81);Other abnormalities of gait and mobility (R26.89);Pain;Muscle weakness (generalized) (M62.81) Pain - part of body:  (abdomen)   Activity Tolerance Patient tolerated treatment well   Patient Left in bed;with call bell/phone within reach;with family/visitor present   Nurse Communication Mobility status        Time: 8940-8877 OT Time Calculation (min): 23 min  Charges: OT General Charges $OT Visit: 1 Visit OT Treatments $Self Care/Home Management : 8-22 mins $Therapeutic Activity: 8-22 mins  Dick Laine, OTA Acute Rehabilitation Services  Office [phone]   Jeb LITTIE Laine 08/28/2023, 11:44 AM

## 2023-08-28 NOTE — Plan of Care (Signed)

## 2023-08-28 NOTE — Plan of Care (Signed)
  Problem: Clinical Measurements: Goal: Ability to maintain clinical measurements within normal limits will improve Outcome: Progressing   Problem: Activity: Goal: Risk for activity intolerance will decrease Outcome: Progressing   Problem: Coping: Goal: Level of anxiety will decrease Outcome: Progressing   Problem: Elimination: Goal: Will not experience complications related to bowel motility Outcome: Progressing   Problem: Pain Managment: Goal: General experience of comfort will improve and/or be controlled Outcome: Progressing   Problem: Safety: Goal: Ability to remain free from injury will improve Outcome: Progressing

## 2023-08-28 NOTE — Progress Notes (Addendum)
 10 Days Post-Op  Subjective: CC: Patient reports nausea overnight when NGT was clamped. NGT was rehooked to [employer] w/ 250cc bilious output out. NGT clamped again this morning. She reports nausea has improved and she is tolerating cld this am. She is having ostomy output. She denies any current abdominal pain.   Afebrile. No tachycardia or hypotension. WBC 12.2 from 8. Xray reviewed from this morning.   Objective: Vital signs in last 24 hours: Temp:  [97.4 F (36.3 C)-98.7 F (37.1 C)] 97.4 F (36.3 C) (07/21 0756) Pulse Rate:  [69-76] 69 (07/21 0756) Resp:  [12-21] 21 (07/21 0756) BP: (143-176)/(66-84) 147/66 (07/21 0756) SpO2:  [94 %-97 %] 96 % (07/21 0756) Arterial Line BP: (155)/(83) 155/83 (07/20 1257) Last BM Date : 08/28/23  Intake/Output from previous day: 07/20 0701 - 07/21 0700 In: 653 [I.V.:5; Blood:638] Out: -  Intake/Output this shift: Total I/O In: -  Out: 700 [Urine:450; Emesis/NG output:250]  PE: General appearance: alert and cooperative GI: Soft, LLQ ttp, no rigidity or guarding. Ostomy viable. Midline wound overall clean with mostly granulation tissue and some fibrinous tissue at the base, 1.5cm are of tracking at the most inferior aspect, no obvious separation of fascia, no drainage.              NGT - clamped             JP RLQ - purulent    Lab Results:  Recent Labs    08/27/23 0405 08/28/23 0450  WBC 8.0 12.2*  HGB 7.0* 10.7*  HCT 21.9* 32.1*  PLT 417* 471*   BMET Recent Labs    08/27/23 0405 08/28/23 0450  NA 139 140  K 3.8 3.8  CL 105 105  CO2 27 26  GLUCOSE 100* 99  BUN 17 17  CREATININE 0.46 0.41*  CALCIUM  8.0* 8.2*   PT/INR No results for input(s): LABPROT, INR in the last 72 hours. CMP     Component Value Date/Time   NA 140 08/28/2023 0450   NA 140 11/10/2007 0000   K 3.8 08/28/2023 0450   CL 105 08/28/2023 0450   CO2 26 08/28/2023 0450   GLUCOSE 99 08/28/2023 0450   BUN 17 08/28/2023 0450   BUN 10 11/10/2007  0000   CREATININE 0.41 (L) 08/28/2023 0450   CREATININE 0.74 06/26/2020 1522   CALCIUM  8.2 (L) 08/28/2023 0450   PROT 5.1 (L) 08/28/2023 0450   ALBUMIN  1.9 (L) 08/28/2023 0450   AST 14 (L) 08/28/2023 0450   ALT 12 08/28/2023 0450   ALKPHOS 67 08/28/2023 0450   BILITOT 0.5 08/28/2023 0450   GFRNONAA >60 08/28/2023 0450   Lipase     Component Value Date/Time   LIPASE <10 (L) 07/30/2023 0936    Studies/Results: DG Abd Portable 1V Result Date: 08/28/2023 CLINICAL DATA:  73 year old female status post exploratory laparotomy. Abdominal pain. Small-bowel obstruction. EXAM: PORTABLE ABDOMEN - 1 VIEW COMPARISON:  Abdomen radiographs yesterday and earlier. FINDINGS: Portable AP supine view at 0724 hours. Stable enteric tube, left double-J ureteral stent, right percutaneous pelvic drain. Non obstructed bowel gas pattern. Stable visualized osseous structures. Negative lung bases. IMPRESSION: 1. Non obstructed bowel gas pattern. 2.  Stable lines and tubes. Electronically Signed   By: VEAR Hurst M.D.   On: 08/28/2023 07:44   DG Abd Portable 1V Result Date: 08/27/2023 CLINICAL DATA:  74 year old female status post exploratory laparotomy. Abdominal pain. Small-bowel obstruction. EXAM: PORTABLE ABDOMEN - 1 VIEW COMPARISON:  Abdominal radiographs yesterday and  earlier. FINDINGS: Portable AP supine view at 0636 hours. Stable lines and tubes in the abdomen and pelvis. Negative lung bases. Stable visualized osseous structures. Oral contrast previously in the right colon has continued distally. Mild left abdominal small bowel gas use distension now, 3.7 cm. Bowel-gas pattern remains improved compared to the CT scout view on 08/23/2023. no definite pneumoperitoneum on these supine views. IMPRESSION: 1. Large bowel oral contrast has continued distally. Mildly dilated left abdominal small bowel loops, but gas pattern still improved compared to recent CT Abdomen and Pelvis. 2. Stable lines and tubes. Electronically Signed    By: VEAR Hurst M.D.   On: 08/27/2023 06:59    Anti-infectives: Anti-infectives (From admission, onward)    Start     Dose/Rate Route Frequency Ordered Stop   08/18/23 1800  Ampicillin -Sulbactam (UNASYN ) 3 g in sodium chloride  0.9 % 100 mL IVPB  Status:  Discontinued        3 g 200 mL/hr over 30 Minutes Intravenous Every 6 hours 08/18/23 0922 08/27/23 1241   08/06/23 1130  acyclovir  (ZOVIRAX ) 200 MG capsule 400 mg  Status:  Discontinued        400 mg Oral 3 times daily 08/06/23 1037 08/08/23 1424   08/06/23 1100  piperacillin -tazobactam (ZOSYN ) IVPB 3.375 g        3.375 g 12.5 mL/hr over 240 Minutes Intravenous Every 8 hours 08/06/23 1051 08/18/23 1259   07/30/23 1800  piperacillin -tazobactam (ZOSYN ) IVPB 3.375 g        3.375 g 12.5 mL/hr over 240 Minutes Intravenous Every 8 hours 07/30/23 1332 08/04/23 2359   07/30/23 1400  piperacillin -tazobactam (ZOSYN ) IVPB 3.375 g  Status:  Discontinued        3.375 g 100 mL/hr over 30 Minutes Intravenous Every 8 hours 07/30/23 1329 07/30/23 1332   07/30/23 1015  piperacillin -tazobactam (ZOSYN ) IVPB 3.375 g        3.375 g 100 mL/hr over 30 Minutes Intravenous  Once 07/30/23 1013 07/30/23 1311        Assessment/Plan S/p ex lap with Hartmann's procedure for stercoral colitis with ischemia and perforation with feculent peritonitis, Dr. Polly 6/22  - CT scan (7/5) showed collection remains sizable and has slightly enlarged compared with the previous study. This abuts the rectal suture line and a leak in this area cannot be excluded. IR repositioned drains, both purulent. Cx with proteus mirabilis and enterococcus  - CT scan 7/17 shows dilated stomach and SBO, some improvement in collections  - 7/17 RLQ drain removed, R transgluteal drain left in place and purulent - Repeat NGT clamping trial and CLD today. Possible NGT removal later today.  - Continue BID dressing changes to midline - Dakins added 7/19 for 3d for exam finding of pseudomonas  colonization. Now appears to be clearing up.  - WOCN following for new ostomy.  - Mobilize, PT - recommending HH - Pulm toilet   FEN: NGT clamped, CLD, TPN VTE: Lovenox  ID: Abx stopped 7/20. Afebrile. WBC 12.2 from 8. Consider repeat CT this week if fails clamping trial, fever or rising/persistent leukocytosis   LOS: 29 days    Ozell CHRISTELLA Shaper, [facility] 08/28/2023, 11:28 AM Please see Amion for pager number during day hours 7:00am-4:30pm

## 2023-08-28 NOTE — Consult Note (Signed)
 WOC Nurse ostomy follow up Stoma type/location: LLQ colostomy Stomal assessment/size:  13/4 inch round, moist, os at center  Peristomal assessment: intact Treatment options for stomal/peristomal skin: 2 inch barrier ring  Output brown stool Ostomy pouching: 2pc. Flat with barrier ring. 2 piece ostomy skin barrier (lawson #2), ostomy pouch (lawson [phone]), barrier rings (lawson [phone])  Education provided: Spouse at bedside and independently performed ostomy appliance change.  Patient and spouse able to verbalize frequency of emptying and change.  Enrolled patient in [place] [employer] Start Discharge program: Yes  Noted at time of visit, ostomy leaking from opening between pouch and skin barrier, stool present on outside of wound dressing.  Surgical PA at bedside, dressing removed, wound is pink moist, with scattered yellow slough in superior and inferior aspect, measures 16 cm x 4 cm x 2.5 cm with a 1.5 cm tunnel in the inferior aspect of wound.  WTA changed wet to dry dressing per orders.  WOC team will see patient on Monday for continued teaching/ reinforcement is still inpatient.   Thank you, Doyal Polite, RN, MSN, [facility] WOC Team

## 2023-08-28 NOTE — Progress Notes (Signed)
 Referring Provider(s): Dr. Ann   Supervising Physician: Karalee Beat  Patient Status:  [facility] - In-pt  Chief Complaint:  S/p R TG 12Fr and RLQ 12 Fr drains placed 08/14/23 by Dr. Vanice   Subjective:  Pt seen lying in bed with husband at bedside.  Pt endorses some discomfort at transgluteal drain site but no pain. All pt and husband at bedside.    Allergies: Codeine  Medications: Prior to Admission medications   Medication Sig Start Date End Date Taking? Authorizing Provider  albuterol  (VENTOLIN  HFA) 108 (90 Base) MCG/ACT inhaler Inhale 2 puffs into the lungs every 6 (six) hours as needed for wheezing or shortness of breath. 03/03/22  Yes Kuneff, Renee A, DO  Calcium  Polycarbophil (FIBER-CAPS PO) Take 1 capsule by mouth daily at 12 noon.   Yes [provider]  HYDROcodone -acetaminophen  (NORCO/VICODIN) 5-325 MG tablet Take 1 tablet by mouth every 6 (six) hours as needed for moderate pain (pain score 4-6). 07/29/23  Yes Zackowski, Scott, MD  hydrocortisone  (ANUSOL -HC) 25 MG suppository Place 1 suppository (25 mg total) rectally 2 (two) times daily. 06/23/23  Yes Kuneff, Renee A, DO  hydrocortisone  cream 1 % Apply 1 Application topically 2 (two) times daily. 06/08/23  Yes Kuneff, Renee A, DO  latanoprost  (XALATAN ) 0.005 % ophthalmic solution Place 1 drop into both eyes at bedtime. 08/11/17  Yes [provider]  LINZESS 72 MCG capsule Take 72 mcg by mouth daily before breakfast. 06/26/23  Yes [provider]  lisinopril  (ZESTRIL ) 10 MG tablet TAKE 1 TABLET BY MOUTH DAILY 05/22/23  Yes Kuneff, Renee A, DO  Multiple Vitamin (MULTIVITAMIN) tablet Take 1 tablet by mouth daily.   Yes [provider]  ondansetron  (ZOFRAN -ODT) 4 MG disintegrating tablet Take 1 tablet (4 mg total) by mouth every 8 (eight) hours as needed for nausea or vomiting. 07/29/23  Yes Zackowski, Scott, MD  Probiotic Product (DAILY PROBIOTIC PO) Take 1 capsule by mouth daily at 12 noon.    Yes [provider]  timolol  (TIMOPTIC ) 0.5 % ophthalmic solution Place 1 drop into both eyes 2 (two) times daily. 11/14/19  Yes [provider]  amoxicillin -clavulanate (AUGMENTIN ) 875-125 MG tablet Take 1 tablet by mouth every 12 (twelve) hours. Patient not taking: Reported on 07/31/2023 07/29/23   Zackowski, Scott, MD  atorvastatin  (LIPITOR) 20 MG tablet Take 1 tablet (20 mg total) by mouth at bedtime. Patient not taking: Reported on 02/22/2023 11/30/21   Catherine Fuller A, DO     Vital Signs: BP (!) 147/66 (BP Location: Left Arm)   Pulse 69   Temp (!) 97.4 F (36.3 C) (Oral)   Resp (!) 21   Ht 5' 4 (1.626 m)   Wt 130 lb 4.7 oz (59.1 kg)   SpO2 96%   BMI 22.36 kg/m   Physical Exam Constitutional:      Appearance: Normal appearance.  Skin:    Comments: Right transgluteal drain F/A easily  Skin clean, dry, without signs of infection   Output 20 mL; white, thick output in JP  Neurological:     Mental Status: She is alert and oriented to person, place, and time.  Psychiatric:        Behavior: Behavior normal.      Labs:  CBC: Recent Labs    08/25/23 0339 08/26/23 0430 08/27/23 0405 08/28/23 0450  WBC 10.8* 10.8* 8.0 12.2*  HGB 7.4* 7.6* 7.0* 10.7*  HCT 23.5* 23.8* 21.9* 32.1*  PLT 463* 482* 417* 471*  COAGS: Recent Labs    07/29/23 0826 08/07/23 0909 08/15/23 0431  INR 1.1 1.2 1.1    BMP: Recent Labs    08/25/23 0339 08/26/23 0430 08/27/23 0405 08/28/23 0450  NA 139 140 139 140  K 4.0 3.6 3.8 3.8  CL 107 107 105 105  CO2 25 27 27 26   GLUCOSE 98 109* 100* 99  BUN 18 18 17 17   CALCIUM  7.8* 7.9* 8.0* 8.2*  CREATININE 0.53 0.44 0.46 0.41*  GFRNONAA >60 >60 >60 >60    LIVER FUNCTION TESTS: Recent Labs    08/25/23 0339 08/26/23 0430 08/27/23 0405 08/28/23 0450  BILITOT 0.2 0.2 0.2 0.5  AST 13* 14* 14* 14*  ALT 13 11 12 12   ALKPHOS 73 67 65 67  PROT 4.9* 5.0* 4.7* 5.1*  ALBUMIN  1.6* 1.6* 1.6* 1.9*    Assessment and  Plan:  S/p R TG 12Fr and RLQ 12 Fr drains placed 7/7 by Dr. Vanice - RLQ drain removed 08/24/23   Drain Location: Right Transgluteal  Size: Fr size: 12 Fr Date of placement: 08/14/23  Currently to: Drain collection device: suction bulb 24 hour output:  Output by Drain (mL) 08/26/23 0701 - 08/26/23 1900 08/26/23 1901 - 08/27/23 0700 08/27/23 0701 - 08/27/23 1900 08/27/23 1901 - 08/28/23 0700 08/28/23 0701 - 08/28/23 1138  Closed System Drain Right Buttock Bulb (JP) 12 Fr. 30        Interval imaging/drain manipulation:   IR Inject Indwelling Drainage Catheter - 08/24/23   IMPRESSION: 1. No significant residual abscess collection or obvious fistulous communication. 2. The RIGHT lower quadrant drainage catheter was removed. 3. The RIGHT transgluteal approach drainage catheter was left in place, secondary to residual output volume. No significant abscess collection or obvious fistulous communication noted on fluoroscopic evaluation.   Thom Hall, MD  Current examination: Flushes/aspirates easily.  Insertion site unremarkable. Suture and stat lock in place. Dressed appropriately.   Plan: Continue TID flushes with 5 cc NS. Record output Q shift. Dressing changes QD or PRN if soiled.  Call IR APP or on call IR MD if difficulty flushing or sudden change in drain output.  Repeat imaging/possible drain injection once output < 10 mL/QD (excluding flush material). Consideration for drain removal if output is < 10 mL/QD (excluding flush material), pending discussion with the providing surgical service.  Discharge planning: Please contact IR APP or on call IR MD prior to patient d/c to ensure appropriate follow up plans are in place. Typically patient will follow up with IR clinic 10-14 days post d/c for repeat imaging/possible drain injection. IR scheduler will contact patient with date/time of appointment. Patient will need to flush drain QD with 5 cc NS, record output QD, dressing changes  every 2-3 days or earlier if soiled.   IR will continue to follow - please call with questions or concerns.  Electronically Signed: Lavanda JAYSON Jurist, PA-C 08/28/2023, 11:38 AM    I spent a total of 15 Minutes at the the patient's bedside AND on the patient's hospital floor or unit, greater than 50% of which was counseling/coordinating care for S/p R TG 12Fr and RLQ 12 Fr drains placed 7/7 by Dr. Vanice - RLQ drain removed 08/24/23.

## 2023-08-28 NOTE — Progress Notes (Signed)
 Physical Therapy Treatment Patient Details Name: Angela [name] MRN: [id number] DOB: [birth year] Today's Date: 08/28/2023   History of Present Illness 73 yo female presents to ED on 6/21 with abdominal pain. Workup for sepsis secondary to bowel perforation. S/p sigmoidectomy with end colostomy 6/22. 6/29 increased WBC and abd pain, s/p CT guided drain placement for pelvic abscess 6/30, repeat drain placement RLQ and R transgluteal pelvic drain 7/7. Pt also with hydroureteronephrosis secondary to L ureteral stone, s/p L ureteral stent placement and L retrograde pyelogram 7/11. PMH includes  HTN, HLD, glaucoma and prior tobacco abuse.    PT Comments  Pt supine in bed on arrival.  She required increased time d/t lines/leads/tubes and drains.  She is mobilizing with min assistance d/t minor balance impairments this session.  Pt continues to benefit from rehab in home setting and has a supportive spouse to d/c home with.      If plan is discharge home, recommend the following: A little help with walking and/or transfers;A little help with bathing/dressing/bathroom;Assistance with cooking/housework;Direct supervision/assist for medications management;Assist for transportation;Help with stairs or ramp for entrance   Can travel by private vehicle        Equipment Recommendations  Rolling walker (2 wheels)    Recommendations for Other Services       Precautions / Restrictions Precautions Precautions: Fall;Other (comment) Recall of Precautions/Restrictions: Intact Precaution/Restrictions Comments: Multiple JP drains now along with ostomy bag. NG tube has been pulled     Mobility  Bed Mobility Overal bed mobility: Modified Independent             General bed mobility comments: assistance for lines and leads but no physical assistance needed.    Transfers Overall transfer level: Needs assistance Equipment used: None Transfers: Sit to/from Stand Sit to Stand: Supervision            General transfer comment: cues for hand placement to push from seated surface.    Ambulation/Gait Ambulation/Gait assistance: Min assist Gait Distance (Feet): 120 Feet Assistive device: IV Pole Gait Pattern/deviations: Decreased stride length, Step-through pattern, Trunk flexed Gait velocity: decreased     General Gait Details: Cues for upper trunk control and increasing B stride length.   Stairs             Wheelchair Mobility     Tilt Bed    Modified Rankin (Stroke Patients Only)       Balance Overall balance assessment: Needs assistance Sitting-balance support: Feet supported Sitting balance-Leahy Scale: Good       Standing balance-Leahy Scale: Good                              Communication Communication Communication: No apparent difficulties  Cognition Arousal: Alert Behavior During Therapy: WFL for tasks assessed/performed   PT - Cognitive impairments: No apparent impairments                       PT - Cognition Comments: Very flat affect.        Cueing Cueing Techniques: Verbal cues  Exercises      General Comments        Pertinent Vitals/Pain Pain Assessment Pain Assessment: Faces Faces Pain Scale: Hurts a little bit Pain Location: abdomen Pain Descriptors / Indicators: Discomfort Pain Intervention(s): Monitored during session, Repositioned (denied pain pre session, increased pain post session.)    Home Living  Prior Function            PT Goals (current goals can now be found in the care plan section) Acute Rehab PT Goals Patient Stated Goal: to get better Potential to Achieve Goals: Good Progress towards PT goals: Progressing toward goals    Frequency    Min 3X/week      PT Plan      Co-evaluation              AM-PAC PT 6 Clicks Mobility   Outcome Measure  Help needed turning from your back to your side while in a flat bed without using  bedrails?: None Help needed moving from lying on your back to sitting on the side of a flat bed without using bedrails?: None Help needed moving to and from a bed to a chair (including a wheelchair)?: A Little Help needed standing up from a chair using your arms (e.g., wheelchair or bedside chair)?: A Little Help needed to walk in hospital room?: A Little Help needed climbing 3-5 steps with a railing? : A Little 6 Click Score: 20    End of Session Equipment Utilized During Treatment: Gait belt Activity Tolerance: Patient tolerated treatment well;Patient limited by fatigue Patient left: with call bell/phone within reach;with family/visitor present;in bed;with nursing/sitter in room (sitting edge of bed to recieve meds) Nurse Communication: Mobility status PT Visit Diagnosis: Other abnormalities of gait and mobility (R26.89);Pain Pain - part of body:  (abdomen)     Time: 9099-9066 PT Time Calculation (min) (ACUTE ONLY): 33 min  Charges:    $Gait Training: 8-22 mins PT General Charges $$ ACUTE PT VISIT: 1 Visit                     Toya HAMS , PTA Acute Rehabilitation Services Office [phone]    Toya JINNY Gosling 08/28/2023, 9:40 AM

## 2023-08-28 NOTE — Progress Notes (Signed)
 PROGRESS NOTE        PATIENT DETAILS Name: Angela [name] Age: 73 y.o. Sex: female Date of Birth: Oct 04, 1950 Admit Date: 07/30/2023 Admitting Physician Maximino DELENA Sharps, MD ERE:[id number], [name], DO  Brief Summary: Patient is a 73 y.o.  female with history of HTN, HLD who presented with worsening abdominal pain-patient was found to have sigmoid perforation with feculent peritonitis in the setting of stercoral colitis.  Patient underwent sigmoidectomy with end colostomy on 6/22-unfortunately postop course complicated by ileus and pelvic residual abscesses requiring IR drain placement.    Significant events: 6/21>> evaluated in the ED for abdominal pain-found to have left mid ureteral stone-9 mm with hydronephrosis-and large volume of stool-managed with supportive care and discharged home. 6/22>> worsening abdominal pain-repeat CT scan consistent with bowel perforation-surgery consult-admit to [facility]. 6/29>> CT abdomen with pelvic abscess. 6/30>> CT-guided drain and pelvic abscess by IR 7/04>> vomiting-recurrent ileus-NG tube reinserted 7/05>> TNA started-repeat CT abdomen increased size of abscess. 7/07>> IR placed RLQ drain and right transgluteal pelvic drain. 7/10>> NGT removed  Significant studies: 6/22>> CT renal stone study: Interval development of intraperitoneal free air-moderate volume free fluid-consistent with bowel perforation, persistent large volume stool with dilated sigmoid colon.  Similar location of left ureteral stone-9 mm. 6/29>> CT abdomen/pelvis: S/p partial colectomy-LLQ ostomy-complex free fluid with multiple foci of air-most pronounced in the surgical bed-possibility of superimposed infection or bowel leak cannot be excluded. 6/30>> echo: EF 60-65%. 7/05>> CT abdomen/pelvis: Previous collection with pigtail has increased in size, other less well-defined but enlarging peritoneal fluid collections.  Persistent-partially obstructing 11 mm calculus  in the distal left ureter.  Persistent fluid loops of dilated small bowel-likely ileus.  Significant microbiology data: 6/22>> urine culture: No growth 6/22>> blood culture: No growth 6/30>> pelvic abscess culture: Gram-negative rods/gram-positive cocci-mixed anaerobic flora. 7/07>> Pelvic abscess culture:moderate proteus and enterococcus  Procedures: 6/22>> sigmoidectomy with end colostomy.  By general surgeon Dr. Polly 6/30>> CT-guided drain and pelvic abscess by IR 08/19/2023 1.  Cystoscopy with left ureteral stent placement. 2. left retrograde pyelogram with interpretation. 3. Fluoroscopy <1 hour with intraoperative interpretation by urologist Dr. Arlyss Foot  Consults: General Surgery IR Urology  Subjective:  Patient in bed, appears comfortable, denies any headache, no fever, no chest pain or pressure, no shortness of breath , she has slightly more abdominal pain and sensation of bloating today. No new focal weakness.    Objective: Vitals: Blood pressure (!) 147/66, pulse 69, temperature (!) 97.4 F (36.3 C), temperature source Oral, resp. rate (!) 21, height 5' 4 (1.626 m), weight 59.1 kg, SpO2 96%.   Exam:  Awake Alert, No new F.N deficits, Normal affect [place].AT,PERRAL Supple Neck, No JVD,   Symmetrical Chest wall movement, Good air movement bilaterally, CTAB RRR,No Gallops, Rubs or new Murmurs,  Abdomen is more distended on 08/24/2023. 2 abdominal drains, colostomy bag with brown stool, right arm PICC line, NG tube in place.   Assessment/Plan:  Sepsis secondary to stercoral colitis with ischemia/perforation of sigmoid colon with feculent peritonitis-s/p sigmoid colectomy/ostomy on 6/22-complicated by postoperative ileus and then residual pelvic abscess requiring CT-guided drain placement by on 6/30. Ileus seems to be slowly improving-multiple output in ostomy bag overnight.  NG tube has been removed. Remains n.p.o. for ureteral stent placement later today. Remains  on Zosyn  >> Unasyn  per General Surgery.  Discontinue once okay by  general surgery have quested them to look into it on 08/27/2023. General Surgery following-for management of this problem to general surgery.  Finished her antibiotic course on 08/27/2023.  CT abdomen pelvis being repeated on 08/23/2023 suggestive of small bowel obstruction, continue TNA, continue NG, defer management of this issue to general surgery.  Hydronephrosis secondary to distal left ureteral stone Urology following-with recommendations for supportive care for now-now that her medical problems have more or less stabilized underwent left ureteral stent placement by urology Dr. Arlyss Foot on 08/18/2023  Hospital delirium Secondary to acute illness/narcotics Now resolved  Normocytic anemia Due to acute illness,+/- coffee-ground emesis on 7/7 Hemoglobin stable although low, type screen done will continue to monitor transfuse if she drops consistently close to hemoglobin of 7.  Thrombocytosis Likely reactive Supportive care  Coffee-ground NG tube emesis Seems to have resolved-no longer coffee-ground emesis-bile appearing fluid in canister. Continue PPI Hb stable, staying around 7.5, from multiple blood draws and some perioperative blood loss along with him dilution.  She is s/p 2 units of packed RBC transfusion on 08/27/2023, posttransfusion H&H stable.  Hypomagnesemia/hypophosphatemia/hypokalemia Secondary to NG tube suctioning Resolved.  HTN BP stable-all oral antihypertensives on hold due to n.p.o. status Continue with as needed IV hydralazine  and metoprolol .   Lower extremity edema Secondary to hypoalbuminemia/IV fluid resuscitation in the setting of acute illness Echo stable-UA negative for proteinuria Will start diuretics once she is a bit more stable.  Chronic idiopathic constipation Bowel function recovering-brown stools Once more stable-will need to be placed on a bowel regimen.  Odynophagia  secondary to oral/aphthous ulcers Improved on Valtrex and Magic mouthwash Supportive care  Glaucoma Timolol  eyedrops  Insomnia Appears to be primarily due to pain-anxiety Schedule trazodone  at night If no response-use IV Benadryl   Debility/deconditioning PT OT eval-Home health recommended.  Code status:   Code Status: Full Code   DVT Prophylaxis: Place TED hose Start: 08/27/23 0539 enoxaparin  (LOVENOX ) injection 40 mg Start: 08/17/23 1000 Place TED hose Start: 08/09/23 1000 SCDs Start: 07/30/23 1953   Family Communication: Family member bedside on 08/22/2023, 08/23/2023, 08/24/2023, 08/25/2023, 08/26/2023, 08/27/2023, 08/28/2023   Disposition Plan: Status is: Inpatient Remains inpatient appropriate because: Severity of illness   Planned Discharge Destination:Home health   Diet: Diet Order             Diet clear liquid Room service appropriate? Yes; Fluid consistency: Thin  Diet effective now                    Data Review:   Inpatient Medications  Scheduled Meds:  sodium chloride    Intravenous Once   acetaminophen   1,000 mg Oral Q6H   bisacodyl   10 mg Rectal Daily   carvedilol   3.125 mg Oral BID WC   Chlorhexidine  Gluconate Cloth  6 each Topical Daily   enoxaparin  (LOVENOX ) injection  40 mg Subcutaneous Q24H   feeding supplement  237 mL Oral BID BM   ferrous sulfate   325 mg Oral BID WC   folic acid   1 mg Oral Daily   latanoprost   1 drop Both Eyes QHS   methocarbamol   500 mg Oral TID   Or   methocarbamol  (ROBAXIN ) injection  500 mg Intravenous TID   pantoprazole  (PROTONIX ) IV  40 mg Intravenous Q12H   polyethylene glycol  17 g Oral BID   sodium hypochlorite   Irrigation BID   timolol   1 drop Both Eyes BID   traZODone   50 mg Oral QHS   Continuous Infusions:  TPN ADULT (ION) 70 mL/hr at 08/27/23 1808   PRN Meds:.albuterol , diphenhydrAMINE , hydrALAZINE , HYDROmorphone  (DILAUDID ) injection, melatonin, menthol -cetylpyridinium, ondansetron  **OR** ondansetron   (ZOFRAN ) IV, mouth rinse, oxyCODONE , phenol, sodium chloride  flush  DVT Prophylaxis  Place TED hose Start: 08/27/23 0539 enoxaparin  (LOVENOX ) injection 40 mg Start: 08/17/23 1000 Place TED hose Start: 08/09/23 1000 SCDs Start: 07/30/23 1953   Recent Labs  Lab 08/24/23 0442 08/25/23 0339 08/26/23 0430 08/27/23 0405 08/28/23 0450  WBC 9.8 10.8* 10.8* 8.0 12.2*  HGB 7.2* 7.4* 7.6* 7.0* 10.7*  HCT 22.7* 23.5* 23.8* 21.9* 32.1*  PLT 443* 463* 482* 417* 471*  MCV 90.1 90.7 90.8 90.9 90.2  MCH 28.6 28.6 29.0 29.0 30.1  MCHC 31.7 31.5 31.9 32.0 33.3  RDW 17.2* 17.6* 18.1* 18.3* 17.2*  LYMPHSABS 1.7 1.8 1.6 2.0 1.3  MONOABS 0.5 0.6 0.5 0.4 0.6  EOSABS 0.2 0.2 0.2 0.3 0.3  BASOSABS 0.1 0.0 0.1 0.1 0.1    Recent Labs  Lab 08/22/23 0400 08/23/23 0349 08/24/23 0442 08/25/23 0339 08/26/23 0430 08/27/23 0405 08/28/23 0450  NA 138 138 136 139 140 139 140  K 4.2 3.8 3.5 4.0 3.6 3.8 3.8  CL 103 102 105 107 107 105 105  CO2 26 27 25 25 27 27 26   ANIONGAP 9 9 6 7 6 7 9   GLUCOSE 94 90 106* 98 109* 100* 99  BUN 22 22 22 18 18 17 17   CREATININE 0.51 0.52 0.50 0.53 0.44 0.46 0.41*  AST 17 18 14* 13* 14* 14* 14*  ALT 14 15 15 13 11 12 12   ALKPHOS 89 75 75 73 67 65 67  BILITOT 0.3 0.3 0.2 0.2 0.2 0.2 0.5  ALBUMIN  1.9* 1.7* 1.6* 1.6* 1.6* 1.6* 1.9*  CRP 1.0*  --  1.6* 1.0* 1.0* 0.9  --   PROCALCITON <0.10 <0.10 <0.10 <0.10 <0.10 <0.10  --   MG 1.9 2.0 1.7 1.9 1.8 1.9  --   PHOS 3.4 3.7 2.8 2.8 3.3 3.4  --   CALCIUM  8.5* 8.1* 7.7* 7.8* 7.9* 8.0* 8.2*      Recent Labs  Lab 08/22/23 0400 08/23/23 0349 08/24/23 0442 08/25/23 0339 08/26/23 0430 08/27/23 0405 08/28/23 0450  CRP 1.0*  --  1.6* 1.0* 1.0* 0.9  --   PROCALCITON <0.10 <0.10 <0.10 <0.10 <0.10 <0.10  --   MG 1.9 2.0 1.7 1.9 1.8 1.9  --   CALCIUM  8.5* 8.1* 7.7* 7.8* 7.9* 8.0* 8.2*    --------------------------------------------------------------------------------------------------------------- Lab Results   Component Value Date   CHOL 183 11/30/2021   HDL 62.70 11/30/2021   LDLCALC 101 (H) 11/30/2021   LDLDIRECT 124.0 10/23/2020   TRIG 87 08/28/2023   CHOLHDL 3 11/30/2021    Lab Results  Component Value Date   HGBA1C 6.0 11/30/2021   No results for input(s): TSH, T4TOTAL, FREET4, T3FREE, THYROIDAB in the last 72 hours. No results for input(s): VITAMINB12, FOLATE, FERRITIN, TIBC, IRON, RETICCTPCT in the last 72 hours. ------------------------------------------------------------------------------------------------------------------ Cardiac Enzymes No results for input(s): CKMB, TROPONINI, MYOGLOBIN in the last 168 hours.  Invalid input(s): CK  Radiology Reports  DG Abd Portable 1V Result Date: 08/28/2023 CLINICAL DATA:  73 year old female status post exploratory laparotomy. Abdominal pain. Small-bowel obstruction. EXAM: PORTABLE ABDOMEN - 1 VIEW COMPARISON:  Abdomen radiographs yesterday and earlier. FINDINGS: Portable AP supine view at 0724 hours. Stable enteric tube, left double-J ureteral stent, right percutaneous pelvic drain. Non obstructed bowel gas pattern. Stable visualized osseous structures. Negative lung bases. IMPRESSION: 1. Non obstructed bowel gas  pattern. 2.  Stable lines and tubes. Electronically Signed   By: VEAR Hurst M.D.   On: 08/28/2023 07:44   DG Abd Portable 1V Result Date: 08/27/2023 CLINICAL DATA:  73 year old female status post exploratory laparotomy. Abdominal pain. Small-bowel obstruction. EXAM: PORTABLE ABDOMEN - 1 VIEW COMPARISON:  Abdominal radiographs yesterday and earlier. FINDINGS: Portable AP supine view at 0636 hours. Stable lines and tubes in the abdomen and pelvis. Negative lung bases. Stable visualized osseous structures. Oral contrast previously in the right colon has continued distally. Mild left abdominal small bowel gas use distension now, 3.7 cm. Bowel-gas pattern remains improved compared to the CT scout view on  08/23/2023. no definite pneumoperitoneum on these supine views. IMPRESSION: 1. Large bowel oral contrast has continued distally. Mildly dilated left abdominal small bowel loops, but gas pattern still improved compared to recent CT Abdomen and Pelvis. 2. Stable lines and tubes. Electronically Signed   By: VEAR Hurst M.D.   On: 08/27/2023 06:59   Signature  -   Lavada Stank M.D on 08/28/2023 at 8:09 AM   -  To page go to [email or web address]

## 2023-08-28 NOTE — TOC Progression Note (Addendum)
 Transition of Care [facility]) - Progression Note    Patient Details  Name: GERLEAN CID MRN: [id number] Date of Birth: 1950-05-27  [facility]) CM/SW Contact  Robynn Eileen Hoose, RN Phone Number: 08/28/2023, 3:14 PM  Clinical Narrative:    Progression rounds discussion; pt has NG tube, clamp trial failed due to having nausea. Pt has colostomy and is getting wound dressing changes to open abdominal wound. Home Health recommended, message to [facility] with [place].         Expected Discharge Plan and Services                                               Social Determinants of Health (SDOH) Interventions SDOH Screenings   Food Insecurity: Patient Declined (08/02/2023)  Housing: Unknown (08/02/2023)  Transportation Needs: Patient Declined (08/02/2023)  Utilities: Patient Declined (08/02/2023)  Alcohol Screen: Low Risk  (04/19/2023)  Depression (PHQ2-9): Low Risk  (06/23/2023)  Financial Resource Strain: Patient Declined (04/19/2023)  Physical Activity: Sufficiently Active (04/19/2023)  Social Connections: Patient Declined (08/02/2023)  Stress: No Stress Concern Present (04/19/2023)  Tobacco Use: Medium Risk (07/30/2023)  Health Literacy: Adequate Health Literacy (04/19/2023)    Readmission Risk Interventions     No data to display

## 2023-08-28 NOTE — Progress Notes (Signed)
 PHARMACY - TOTAL PARENTERAL NUTRITION CONSULT NOTE  Indication: intolerance to feeding, recurrent ileus  Patient Measurements: Height: 5' 4 (162.6 cm) Weight: 59.1 kg (130 lb 4.7 oz) IBW/kg (Calculated) : 54.7 TPN AdjBW (KG): 61.2 Body mass index is 22.36 kg/m. Usual Weight: ~59 kg  Assessment:  72 YOF s/p exp lap with Hartmann's procedure for stercoral colitis with ischemia and perforation with feculent peritonitis on 6/22. Noted pt has been NPO/clear liquid/full liquid diet since after surgery with limited intake. Pt now with ileus on Xray (dilated loops of small bowel) and N/V with liquid diet so NGT replaced 7/4, now NPO and starting TPN. Pt at risk of refeeding with prolonged period of little PO intake since admission.  Glucose / Insulin : no hx DM - CBGs < 180, off insulin  Electrolytes: K 3.8 up s/p replacement 60 kcl outside of TPN 7/20 (goal >/= 4 for ileus), Mag 1.9 up s/p replacement 7/20 (goal >/= 2 for ileus), others WNL Renal: SCr < 1, BUN 1 Hepatic: LFTs / tbili WNL, albumin  1.9, TG trended down to 87 Intake / Output; MIVF: UOP not reliably documented, drain and ostomy not charted, 250 mL NGT out  GI Imaging: 7/5 abd xray - persistent dilated loops of small bowel 7/5 CT - pelvic fluid collection slightly enlarged, suspicious for peritonitis, partially obstructing calculus in L ureter, ileus 7/16 CT small bowel obstruction 7/17 abd XR - SBO 7/19 abd XR - enteric tube in stomach, improving bowel gas pattern, oral contrast in R colon  7/20 Abd xr -  Large bowel oral contrast has continued distally. Mildly dilated left abdominal small bowel loops, but gas pattern still improved GI Surgeries / Procedures:  6/22 ex-lap with Hartmann's procedure for stercoral colitis with ischemia and perf with feculent peritonitis 7/8 CT-guided RLQ abd abscess and transgluteal pelvic drains [>429mL tan colored fluid] 7/10 NGT removed  7/11 NGT replaced  7/16 ureteral stent placement    Central access: PICC placed 08/12/23 TPN start date: 08/12/23  Nutritional Goals: Goal TPN 70 ml/hr will provide 89g AA, 1660 kCal   RD Estimated Needs Total Energy Estimated Needs: 1650-1850 kcals Total Protein Estimated Needs: 80-90 g Total Fluid Estimated Needs: >/= 1.7 L  Current Nutrition:  TPN 7/05 >> NGT clamp trials started 7/19    Plan:   Continue TPN rate at 70 ml/hr to provide 100% of need Electrolytes in TPN: Na 140 mEq/L, K 80 mEq/L, Ca 2 mEq/L, Mg 15 mEq/L (max), Phos 9 mmol/L, Cl:Ac 1:1  Add multivitamin trace elements to TPN  Monitor TPN labs on Mon/Thurs - labs in AM  Thank you for allowing pharmacy to be a part of this patient's care.  Shelba Collier, PharmD, BCPS Clinical Pharmacist

## 2023-08-29 ENCOUNTER — Inpatient Hospital Stay (HOSPITAL_COMMUNITY)

## 2023-08-29 DIAGNOSIS — K631 Perforation of intestine (nontraumatic): Secondary | ICD-10-CM | POA: Diagnosis not present

## 2023-08-29 LAB — COMPREHENSIVE METABOLIC PANEL WITH GFR
ALT: 11 U/L (ref 0–44)
AST: 12 U/L — ABNORMAL LOW (ref 15–41)
Albumin: 2 g/dL — ABNORMAL LOW (ref 3.5–5.0)
Alkaline Phosphatase: 65 U/L (ref 38–126)
Anion gap: 6 (ref 5–15)
BUN: 19 mg/dL (ref 8–23)
CO2: 25 mmol/L (ref 22–32)
Calcium: 8.5 mg/dL — ABNORMAL LOW (ref 8.9–10.3)
Chloride: 105 mmol/L (ref 98–111)
Creatinine, Ser: 0.41 mg/dL — ABNORMAL LOW (ref 0.44–1.00)
GFR, Estimated: >60 mL/min (ref >60)
Glucose, Bld: 96 mg/dL (ref 70–99)
Potassium: 3.8 mmol/L (ref 3.5–5.1)
Sodium: 136 mmol/L (ref 135–145)
Total Bilirubin: 0.4 mg/dL (ref 0.0–1.2)
Total Protein: 5.2 g/dL — ABNORMAL LOW (ref 6.5–8.1)

## 2023-08-29 LAB — CBC WITH DIFFERENTIAL/PLATELET
Abs Immature Granulocytes: 0.05 K/uL (ref 0.00–0.07)
Basophils Absolute: 0.1 K/uL (ref 0.0–0.1)
Basophils Relative: 1 %
Eosinophils Absolute: 0.3 K/uL (ref 0.0–0.5)
Eosinophils Relative: 4 %
HCT: 32 % — ABNORMAL LOW (ref 36.0–46.0)
Hemoglobin: 10.4 g/dL — ABNORMAL LOW (ref 12.0–15.0)
Immature Granulocytes: 1 %
Lymphocytes Relative: 22 %
Lymphs Abs: 1.8 K/uL (ref 0.7–4.0)
MCH: 29.5 pg (ref 26.0–34.0)
MCHC: 32.5 g/dL (ref 30.0–36.0)
MCV: 90.7 fL (ref 80.0–100.0)
Monocytes Absolute: 0.6 K/uL (ref 0.1–1.0)
Monocytes Relative: 7 %
Neutro Abs: 5.5 K/uL (ref 1.7–7.7)
Neutrophils Relative %: 65 %
Platelets: 422 K/uL — ABNORMAL HIGH (ref 150–400)
RBC: 3.53 MIL/uL — ABNORMAL LOW (ref 3.87–5.11)
RDW: 17.7 % — ABNORMAL HIGH (ref 11.5–15.5)
WBC: 8.2 K/uL (ref 4.0–10.5)
nRBC: 0 % (ref 0.0–0.2)

## 2023-08-29 LAB — GLUCOSE, CAPILLARY
Glucose-Capillary: 102 mg/dL — ABNORMAL HIGH (ref 70–99)
Glucose-Capillary: 104 mg/dL — ABNORMAL HIGH (ref 70–99)

## 2023-08-29 LAB — MAGNESIUM: Magnesium: 1.7 mg/dL (ref 1.7–2.4)

## 2023-08-29 LAB — PHOSPHORUS: Phosphorus: 3.8 mg/dL (ref 2.5–4.6)

## 2023-08-29 MED ORDER — IOHEXOL 350 MG/ML SOLN
75.0000 mL | Freq: Once | INTRAVENOUS | Status: AC | PRN
  Administered 2023-08-29: 75 mL via INTRAVENOUS

## 2023-08-29 MED ORDER — TRAVASOL 10 % IV SOLN
INTRAVENOUS | Status: AC
  Filled 2023-08-29: qty 890.4

## 2023-08-29 MED ORDER — CARVEDILOL 6.25 MG PO TABS
6.2500 mg | ORAL_TABLET | Freq: Two times a day (BID) | ORAL | Status: DC
  Administered 2023-08-29 – 2023-09-04 (×11): 6.25 mg via ORAL
  Filled 2023-08-29 (×12): qty 1

## 2023-08-29 NOTE — Progress Notes (Signed)
 Nutrition Follow-up  DOCUMENTATION CODES:   Severe malnutrition in context of acute illness/injury  INTERVENTION:  Monitor for diet advancement and tolerance   Continue Magic cup TID with meals when full liquid diet resumed   Continue Ensure Plus High Protein po BID when full liquid diet resumed   Resume oral MVI w/ minerals when PO diet resumed   TPN to meet estimated nutritional needs; TPN order per Pharmacy Daily weights  MVI added to TPN   TPN providing: 1660 kcals and 89g protein (100% of estimated needs)     NUTRITION DIAGNOSIS:  Severe Malnutrition related to acute illness (stercoral colitis w/ ischemia and performation w/ feculent peritonitis) as evidenced by mild fat depletion, moderate fat depletion. - remains applicable  GOAL:  Patient will meet greater than or equal to 90% of their needs - meeting via TPN  MONITOR:  TF tolerance, I & O's, Diet advancement, Labs  REASON FOR ASSESSMENT:  Consult New TPN/TNA  ASSESSMENT:   73 yo female with stercoral colitis admitted on 6/22 with ischemia and perforation with feculent peritonitis requiring sigmoidectomy with end colostomy. Pt seen in ED on 6/21 with CT indicating colitis with large stool burden-discharged on antibiotics. Post-op course complicated by pelvic abscess and post-op ileus, inability to tolerate po. PMH includes chronic idiopathic constipation, HLD, HTN  6/22 Admitted, Ex Lap, open sigmoid colectomy with colostomy for stercoral colitis with ischemia and perforation with feculent peritonitis 6/23 NPO with ice chips 6/25 CL diet initiated 6/28 Advanced to [facility] diet 6/29 Back to NPO, CT A/P: complex free fluid with multiple foci of air, possible infection or bowel leak; NGT re-placed 6/30 CT-guided drainage of pelvic abscess by IR, drain left in place, ECHO EF 60-65% 7/01 Started back on CL diet, NGT removed 7/02 Advanced to [facility] Diet 7/03 Diet downgraded to CL  due to abdominal distention 7/04 Not  tolerating CL diet, NGT (placed) to LIS, NPO 7/05 Abd xray with persistent dilated loops of small bowel within the left hemiabdomen measure up to 4.3 cm. PICC line placement with TPN initiation 7/07 Pelvic abscess culture: moderate proteus and enterococcus; IR removed old TG drain and replaced with two drains ( R TG 12Fr and RLQ 12 Fr) 7/07 CT-guided placement of abdominal abscess drain to RLQ - yield  7/08 TPN at goal rate 7/10 NGT removed 7/11 advanced to clear liquid diet; NGT replaced 7/12 cytoscopy w/ L ureteral stent placement; L retrograde pyelogram 7/13 NGT removed 7/14 advanced to full liquid diet; transfused 1 unit PRBC 7/16 ureteral stent placement; re[ear CT abd/pelvis: small bowel obstruction 7/17 abd XR: SBO; NGT re-placed ; RLQ drain removed 7/19 NGT clamp trials 7/20 transfused 2 units PRBCs; L ureter drain removed; nausea overnight w/ NGT clamped; LIWS - 250cc output and NGT re-clamped; advanced to clear liquid diet 7/22 NGT removed; diet advanced to full liquid diet  Patient with recurrent SBO. Failed NGT clamping originally, but appears to be tolerating over last day or so. Awaiting output from JP drain to R buttock <78ml output daily to remove. Patient reporting discomfort.  Surgery in to see patient while RD in room this morning. Removed NGT. Patient asking about discontinuation of TPN. MD encouraging adequate PO intake in an effort to wean off TPN. Spoke with pt sister at bedside. Will re-start Ensure Plus High Protein. Encouraged her to encourage patient to consume, to increase calorie and protein intake in an effort wean.   Having ostomy output. Denies abdominal pain or nausea. Abdominal wound  decreasing in size suggesting TPN adequately meeting her calorie and protein needs.   Admit Weight: 57.2 kg  Current Weight: 59.1 kg Lowest Weight: 57.2 kg on admission +generalized, moderate pitting to BLEs   Weight with slight trend up since admission. She is noted  with increased edema to BLEs. Will continue to monitor trend as edema resolves to assess true weight trend and update estimated needs as indicated. Stomal suppositories continue.    Drains/Lines: NGT (gastric): x24 hours R basilic: PICC (placed 7/05) LLQ: colostomy: x24 hours - ?accuracy R buttock: JP drain (12 Fr): 20ml x24 hours UOP: x24 hours   Labs grossly unremarkable. Has completed ABX course as of 7/20. CRP down to within desirable range. WBC down to within range today. H/H stabilized s/p transfusion.    Labs: Sodium 136 (wdl)  Potassium 3.8 (wdl) Magnesium  1.7 (wdl) Phosphorus 3.8 (wdl) CRP 0.9 (wdl) WBC 8.0>12.2>8.2 (wdl) Hgb 7.0>10.7>10.4 (L) CBGs 96-99 x24 hours   Meds:  Dulcolax (stomal suppository) Ferrous sulfate  Folic acid  Pantoprazole  Miralax   Diet Order:   Diet Order             Diet clear liquid Room service appropriate? Yes; Fluid consistency: Thin  Diet effective now            EDUCATION NEEDS:  Education needs have been addressed  Skin:  Skin Assessment: Skin Integrity Issues: Skin Integrity Issues:: Incisions Incisions: abdomen (closed)  Last BM:  7/13 via ostomy - no output documented  Height:  Ht Readings from Last 1 Encounters:  08/18/23 5' 4 (1.626 m)   Weight:  Wt Readings from Last 1 Encounters:  08/27/23 59.1 kg   Ideal Body Weight:  54.55 kg  BMI:  Body mass index is 22.36 kg/m.  Estimated Nutritional Needs:   Kcal:  1650-1850 kcals  Protein:  80-90 g  Fluid:  >/= 1.7 L  Blair Deaner MS, RD, LDN Registered Dietitian Clinical Nutrition RD Inpatient Contact Info in Amion

## 2023-08-29 NOTE — Progress Notes (Signed)
 Referring Physician(s): Dr. Ann  Supervising Physician: Dr. Vanice  Patient Status:  [facility] - In-pt  Chief Complaint:  S/p R TG 12Fr and RLQ 12 Fr drains placed 7/7 by Dr. Vanice.   Request by inpatient team to evaluate the two IR drains. RLQ drain was already removed 7/17 based on CT and output volume/appearance. R TG drain remains, was injected 7/17 without evidence of fistula. R TG left in place for output volume.   Brief timeline: 6/22 ex lap with sigmoidectomy and Hartmann's procedure with Dr. KANDICE Idler.  6/29 revealed complex free fluid w/ air in the surgical bed concerning for superimposed infection vs bowel leak. IR consulted for percutaneous drain placement.  6/30 Dr. Philip placed a drain into the fluid collection. 7/5 CT was notable for enlarged fluid collection. Found that TG drain was not having any output despite image read that it appeared in place.  7/7. Dr. Philip found that drain had become retracted. He removed the old TG drain and replaced it with two drains: R TG 12Fr and RLQ 12 Fr.  7/17 RLQ drain removed, R TG drain injected   Subjective:  Pt up and walking around in room. States that she has started feeling a burning sensation at R TG insertion over the past couple of days. She reports no output from drain today (all present fluid from yesterday) and that she noticed leakage around insertion on sheets.   Allergies: Codeine  Medications: Prior to Admission medications   Medication Sig Start Date End Date Taking? Authorizing Provider  albuterol  (VENTOLIN  HFA) 108 (90 Base) MCG/ACT inhaler Inhale 2 puffs into the lungs every 6 (six) hours as needed for wheezing or shortness of breath. 03/03/22  Yes Kuneff, Renee A, DO  Calcium  Polycarbophil (FIBER-CAPS PO) Take 1 capsule by mouth daily at 12 noon.   Yes [provider]  HYDROcodone -acetaminophen  (NORCO/VICODIN) 5-325 MG tablet Take 1 tablet by mouth every 6 (six) hours as needed for moderate pain (pain  score 4-6). 07/29/23  Yes Zackowski, Scott, MD  hydrocortisone  (ANUSOL -HC) 25 MG suppository Place 1 suppository (25 mg total) rectally 2 (two) times daily. 06/23/23  Yes Kuneff, Renee A, DO  hydrocortisone  cream 1 % Apply 1 Application topically 2 (two) times daily. 06/08/23  Yes Kuneff, Renee A, DO  latanoprost  (XALATAN ) 0.005 % ophthalmic solution Place 1 drop into both eyes at bedtime. 08/11/17  Yes [provider]  LINZESS 72 MCG capsule Take 72 mcg by mouth daily before breakfast. 06/26/23  Yes [provider]  lisinopril  (ZESTRIL ) 10 MG tablet TAKE 1 TABLET BY MOUTH DAILY 05/22/23  Yes Kuneff, Renee A, DO  Multiple Vitamin (MULTIVITAMIN) tablet Take 1 tablet by mouth daily.   Yes [provider]  ondansetron  (ZOFRAN -ODT) 4 MG disintegrating tablet Take 1 tablet (4 mg total) by mouth every 8 (eight) hours as needed for nausea or vomiting. 07/29/23  Yes Zackowski, Scott, MD  Probiotic Product (DAILY PROBIOTIC PO) Take 1 capsule by mouth daily at 12 noon.   Yes [provider]  timolol  (TIMOPTIC ) 0.5 % ophthalmic solution Place 1 drop into both eyes 2 (two) times daily. 11/14/19  Yes [provider]  amoxicillin -clavulanate (AUGMENTIN ) 875-125 MG tablet Take 1 tablet by mouth every 12 (twelve) hours. Patient not taking: Reported on 07/31/2023 07/29/23   Zackowski, Scott, MD  atorvastatin  (LIPITOR) 20 MG tablet Take 1 tablet (20 mg total) by mouth at bedtime. Patient not taking: Reported on 02/22/2023 11/30/21   Catherine Charlies LABOR,  DO     Vital Signs: BP (!) 169/80   Pulse 62   Temp 97.8 F (36.6 C) (Oral)   Resp 14   Ht 5' 4 (1.626 m)   Wt 130 lb 4.7 oz (59.1 kg)   SpO2 98%   BMI 22.36 kg/m   Physical Exam Cardiovascular:     Rate and Rhythm: Normal rate.  Pulmonary:     Effort: Pulmonary effort is normal.  Abdominal:     Palpations: Abdomen is soft.     Comments: Midline surgical wound and ostomy present. RLQ drain site healing well. R TG 15 mL  tan purulent drainage in well charged bulb. External suture appears retracted by 2-4 cm. No stat lock present.  Neurological:     Mental Status: She is alert.   Imaging: DG Abd Portable 1V Result Date: 08/29/2023 CLINICAL DATA:  Small bowel obstruction. EXAM: PORTABLE ABDOMEN - 1 VIEW COMPARISON:  08/28/2023 FINDINGS: NG tube tip is positioned in the proximal stomach. Side port of the NG tube is in the region of the GE junction in tube could be advanced 3-4 cm to ensure side port placement below the GE junction as clinically warranted. Left internal ureteral stent device again noted. Percutaneous pigtail drain overlies the right abdomen and pelvis with formed loop overlying the central upper pelvis. Diffuse gaseous bowel distension noted with some small bowel loops measuring up to 3.6 cm diameter. IMPRESSION: NG tube tip is positioned in the proximal stomach. Side port of the NG tube is in the region of the GE junction and tube could be advanced 3-4 cm to ensure side port placement below the GE junction as clinically Diffuse gaseous bowel distension. Electronically Signed   By: Camellia Candle M.D.   On: 08/29/2023 07:02   DG Abd Portable 1V Result Date: 08/28/2023 CLINICAL DATA:  73 year old female status post exploratory laparotomy. Abdominal pain. Small-bowel obstruction. EXAM: PORTABLE ABDOMEN - 1 VIEW COMPARISON:  Abdomen radiographs yesterday and earlier. FINDINGS: Portable AP supine view at 0724 hours. Stable enteric tube, left double-J ureteral stent, right percutaneous pelvic drain. Non obstructed bowel gas pattern. Stable visualized osseous structures. Negative lung bases. IMPRESSION: 1. Non obstructed bowel gas pattern. 2.  Stable lines and tubes. Electronically Signed   By: VEAR Hurst M.D.   On: 08/28/2023 07:44   DG Abd Portable 1V Result Date: 08/27/2023 CLINICAL DATA:  73 year old female status post exploratory laparotomy. Abdominal pain. Small-bowel obstruction. EXAM: PORTABLE ABDOMEN - 1 VIEW  COMPARISON:  Abdominal radiographs yesterday and earlier. FINDINGS: Portable AP supine view at 0636 hours. Stable lines and tubes in the abdomen and pelvis. Negative lung bases. Stable visualized osseous structures. Oral contrast previously in the right colon has continued distally. Mild left abdominal small bowel gas use distension now, 3.7 cm. Bowel-gas pattern remains improved compared to the CT scout view on 08/23/2023. no definite pneumoperitoneum on these supine views. IMPRESSION: 1. Large bowel oral contrast has continued distally. Mildly dilated left abdominal small bowel loops, but gas pattern still improved compared to recent CT Abdomen and Pelvis. 2. Stable lines and tubes. Electronically Signed   By: VEAR Hurst M.D.   On: 08/27/2023 06:59   DG Abd Portable 1V Result Date: 08/26/2023 CLINICAL DATA:  73 year old female status post exploratory laparotomy. Abdominal pain. Small-bowel obstruction. EXAM: PORTABLE ABDOMEN - 1 VIEW COMPARISON:  Abdominal radiographs yesterday, postoperative CT Abdomen and Pelvis 08/23/2023. FINDINGS: Portable AP supine views at 0629 hours. Satisfactory enteric tube termination in the stomach now, side  hole at the gastric cardia level. Stable left double-J ureteral stent. Stable right pelvic pigtail drain. Bowel-gas pattern appears improved compared to the scout view on 08/23/2023. Oral contrast in the right colon similar to the radiographs yesterday. Stable, negative lung bases. Stable visualized osseous structures. IMPRESSION: 1. Satisfactory enteric tube placement into the stomach. Otherwise stable lines and tubes. 2. Improving bowel gas pattern since the CT on 08/23/2023. Stable oral contrast in the right colon. Electronically Signed   By: VEAR Hurst M.D.   On: 08/26/2023 07:19    Labs:  CBC: Recent Labs    08/26/23 0430 08/27/23 0405 08/28/23 0450 08/29/23 0407  WBC 10.8* 8.0 12.2* 8.2  HGB 7.6* 7.0* 10.7* 10.4*  HCT 23.8* 21.9* 32.1* 32.0*  PLT 482* 417* 471*  422*    COAGS: Recent Labs    07/29/23 0826 08/07/23 0909 08/15/23 0431  INR 1.1 1.2 1.1    BMP: Recent Labs    08/26/23 0430 08/27/23 0405 08/28/23 0450 08/29/23 0407  NA 140 139 140 136  K 3.6 3.8 3.8 3.8  CL 107 105 105 105  CO2 27 27 26 25   GLUCOSE 109* 100* 99 96  BUN 18 17 17 19   CALCIUM  7.9* 8.0* 8.2* 8.5*  CREATININE 0.44 0.46 0.41* 0.41*  GFRNONAA >60 >60 >60 >60    LIVER FUNCTION TESTS: Recent Labs    08/26/23 0430 08/27/23 0405 08/28/23 0450 08/29/23 0407  BILITOT 0.2 0.2 0.5 0.4  AST 14* 14* 14* 12*  ALT 11 12 12 11   ALKPHOS 67 65 67 65  PROT 5.0* 4.7* 5.1* 5.2*  ALBUMIN  1.6* 1.6* 1.9* 2.0*    Assessment and Plan:  Output by Drain (mL) 08/27/23 0701 - 08/27/23 1900 08/27/23 1901 - 08/28/23 0700 08/28/23 0701 - 08/28/23 1900 08/28/23 1901 - 08/29/23 0700 08/29/23 0701 - 08/29/23 1142  Closed System Drain Right Buttock Bulb (JP) 12 Fr.   20       Interval imaging/drain manipulation:    IR Inject Indwelling Drainage Catheter - 08/24/23    IMPRESSION: 1. No significant residual abscess collection or obvious fistulous communication. 2. The RIGHT lower quadrant drainage catheter was removed. 3. The RIGHT transgluteal approach drainage catheter was left in place, secondary to residual output volume. No significant abscess collection or obvious fistulous communication noted on fluoroscopic evaluation.   Thom Hall, MD   Current examination: RLQ drain site healing well. R TG 15 mL tan purulent drainage in well charged bulb. External suture appears retracted by 2-4 cm. No stat lock present. VSS WBC WNL   Plan: CT today to address concern that drain has again become retracted with pull of gravity on bulb. If retracted, will assess need to replace vs remove.   If drain is well positioned/recommended to remain based on imaging today: Will add a stat lock to help with skin irritation from drain being pulled by gravity.  Will add orders for  clinic f/u after DC. Note states home health. Unclear timeline for DC based on IP note.  Continue TID flushes with 5 cc NS. Record output Q shift. Dressing changes QD or PRN if soiled.          Electronically Signed: Laymon Coast, NP 08/29/2023, 11:42 AM   I spent a total of 15 Minutes at the the patient's bedside AND on the patient's hospital floor or unit, greater than 50% of which was counseling/coordinating care for maintenance of x2 abd fluid collect drains.

## 2023-08-29 NOTE — Progress Notes (Deleted)
 Patient to be NPO for drain injection with possible exch/reposition tomorrow in IR based on review of 7/22 CT by Dr. Vanice. Impression as follows: Right transgluteal pigtail drainage catheter with tip in the posterior pelvis. A 5.3 x 2.9 cm loculated collection just superior to the pigtail tip of the catheter in the posterior pelvis, relatively similar to prior CT.  Will communicate plan to patient.

## 2023-08-29 NOTE — Plan of Care (Signed)
  Problem: Activity: Goal: Risk for activity intolerance will decrease Outcome: Progressing   Problem: Nutrition: Goal: Adequate nutrition will be maintained Outcome: Progressing   Problem: Coping: Goal: Level of anxiety will decrease Outcome: Progressing   Problem: Elimination: Goal: Will not experience complications related to bowel motility Outcome: Progressing   Problem: Pain Managment: Goal: General experience of comfort will improve and/or be controlled Outcome: Progressing

## 2023-08-29 NOTE — Progress Notes (Signed)
 11 Days Post-Op  Subjective: CC: Patient denies nausea/vomiting, has small volume of soft stool in bag. Minimal gas noted in bag. NGT remains clamped this AM.  She reports nausea has improved and she is tolerating cld this am. She is having ostomy output. She denies abdominal pain, however, she is complaining of tenderness around transgluteal drain.    Objective: Vital signs in last 24 hours: Temp:  [97.7 F (36.5 C)-98.1 F (36.7 C)] 98.1 F (36.7 C) (07/22 0346) Pulse Rate:  [65-98] 65 (07/22 0346) Resp:  [12-18] 18 (07/22 0600) BP: (148-183)/(69-91) 183/83 (07/22 0346) SpO2:  [98 %] 98 % (07/22 0346) Last BM Date : 08/28/23  Intake/Output from previous day: 07/21 0701 - 07/22 0700 In: 245 [P.O.:240] Out: 3320 [Urine:1950; Emesis/NG output:1350; Drains:20] Intake/Output this shift: No intake/output data recorded.  PE: General appearance: alert and cooperative GI: Soft, LLQ ttp, no rigidity or guarding. Ostomy viable. Midline wound remains overall clean with mostly granulation tissue and some fibrinous tissue at the base, 1.5cm are of tracking at the most inferior aspect, no obvious separation of fascia, no drainage.              NGT - clamped             JP RLQ - purulent    Lab Results:  Recent Labs    08/28/23 0450 08/29/23 0407  WBC 12.2* 8.2  HGB 10.7* 10.4*  HCT 32.1* 32.0*  PLT 471* 422*   BMET Recent Labs    08/28/23 0450 08/29/23 0407  NA 140 136  K 3.8 3.8  CL 105 105  CO2 26 25  GLUCOSE 99 96  BUN 17 19  CREATININE 0.41* 0.41*  CALCIUM  8.2* 8.5*   PT/INR No results for input(s): LABPROT, INR in the last 72 hours. CMP     Component Value Date/Time   NA 136 08/29/2023 0407   NA 140 11/10/2007 0000   K 3.8 08/29/2023 0407   CL 105 08/29/2023 0407   CO2 25 08/29/2023 0407   GLUCOSE 96 08/29/2023 0407   BUN 19 08/29/2023 0407   BUN 10 11/10/2007 0000   CREATININE 0.41 (L) 08/29/2023 0407   CREATININE 0.74 06/26/2020 1522    CALCIUM  8.5 (L) 08/29/2023 0407   PROT 5.2 (L) 08/29/2023 0407   ALBUMIN  2.0 (L) 08/29/2023 0407   AST 12 (L) 08/29/2023 0407   ALT 11 08/29/2023 0407   ALKPHOS 65 08/29/2023 0407   BILITOT 0.4 08/29/2023 0407   GFRNONAA >60 08/29/2023 0407   Lipase     Component Value Date/Time   LIPASE <10 (L) 07/30/2023 0936    Studies/Results: DG Abd Portable 1V Result Date: 08/29/2023 CLINICAL DATA:  Small bowel obstruction. EXAM: PORTABLE ABDOMEN - 1 VIEW COMPARISON:  08/28/2023 FINDINGS: NG tube tip is positioned in the proximal stomach. Side port of the NG tube is in the region of the GE junction in tube could be advanced 3-4 cm to ensure side port placement below the GE junction as clinically warranted. Left internal ureteral stent device again noted. Percutaneous pigtail drain overlies the right abdomen and pelvis with formed loop overlying the central upper pelvis. Diffuse gaseous bowel distension noted with some small bowel loops measuring up to 3.6 cm diameter. IMPRESSION: NG tube tip is positioned in the proximal stomach. Side port of the NG tube is in the region of the GE junction and tube could be advanced 3-4 cm to ensure side port placement below the GE junction  as clinically Diffuse gaseous bowel distension. Electronically Signed   By: Camellia Candle M.D.   On: 08/29/2023 07:02   DG Abd Portable 1V Result Date: 08/28/2023 CLINICAL DATA:  73 year old female status post exploratory laparotomy. Abdominal pain. Small-bowel obstruction. EXAM: PORTABLE ABDOMEN - 1 VIEW COMPARISON:  Abdomen radiographs yesterday and earlier. FINDINGS: Portable AP supine view at 0724 hours. Stable enteric tube, left double-J ureteral stent, right percutaneous pelvic drain. Non obstructed bowel gas pattern. Stable visualized osseous structures. Negative lung bases. IMPRESSION: 1. Non obstructed bowel gas pattern. 2.  Stable lines and tubes. Electronically Signed   By: VEAR Hurst M.D.   On: 08/28/2023 07:44     Anti-infectives: Anti-infectives (From admission, onward)    Start     Dose/Rate Route Frequency Ordered Stop   08/18/23 1800  Ampicillin -Sulbactam (UNASYN ) 3 g in sodium chloride  0.9 % 100 mL IVPB  Status:  Discontinued        3 g 200 mL/hr over 30 Minutes Intravenous Every 6 hours 08/18/23 0922 08/27/23 1241   08/06/23 1130  acyclovir  (ZOVIRAX ) 200 MG capsule 400 mg  Status:  Discontinued        400 mg Oral 3 times daily 08/06/23 1037 08/08/23 1424   08/06/23 1100  piperacillin -tazobactam (ZOSYN ) IVPB 3.375 g        3.375 g 12.5 mL/hr over 240 Minutes Intravenous Every 8 hours 08/06/23 1051 08/18/23 1259   07/30/23 1800  piperacillin -tazobactam (ZOSYN ) IVPB 3.375 g        3.375 g 12.5 mL/hr over 240 Minutes Intravenous Every 8 hours 07/30/23 1332 08/04/23 2359   07/30/23 1400  piperacillin -tazobactam (ZOSYN ) IVPB 3.375 g  Status:  Discontinued        3.375 g 100 mL/hr over 30 Minutes Intravenous Every 8 hours 07/30/23 1329 07/30/23 1332   07/30/23 1015  piperacillin -tazobactam (ZOSYN ) IVPB 3.375 g        3.375 g 100 mL/hr over 30 Minutes Intravenous  Once 07/30/23 1013 07/30/23 1311        Assessment/Plan S/p ex lap with Hartmann's procedure for stercoral colitis with ischemia and perforation with feculent peritonitis, Dr. Polly 6/22   - CT scan (7/5) showed collection remains sizable and has slightly enlarged compared with the previous study. This abuts the rectal suture line and a leak in this area cannot be excluded. IR repositioned drains, both purulent. Cx with proteus mirabilis and enterococcus.  - CT scan 7/17 shows dilated stomach and SBO, some improvement in collections  - 7/17 RLQ drain removed, R transgluteal drain left in place and purulent - Repeat NGT clamping trial and CLD today. Possible NGT removal later today.  - Continue BID dressing changes to midline - Dakins added 7/19 for 3d for exam finding of pseudomonas colonization. Now appears to be clearing up.   - WOCN following for new ostomy.  - Mobilize, PT - recommending HH - Pulm toilet -Recommend consult to IR for drain assessment.    FEN: NGT clamped, CLD>>>FLD, TPN VTE: Lovenox  ID: Abx stopped 7/20. Afebrile. WBC on 7/22 down to 8.2 from 12.2. Consider repeat CT this week if fails clamping trial, fever or rising/persistent leukocytosis.    LOS: 30 days    Eulah Hammonds, [facility] 08/29/2023, 8:00 AM Please see Amion for pager number during day hours 7:00am-4:30pm

## 2023-08-29 NOTE — Progress Notes (Signed)
 Physical Therapy Treatment Patient Details Name: Angela [name] MRN: [id number] DOB: Jul 07, 1950 Today's Date: 08/29/2023   History of Present Illness 73 yo female presents to ED on 6/21 with abdominal pain. Workup for sepsis secondary to bowel perforation. S/p sigmoidectomy with end colostomy 6/22. 6/29 increased WBC and abd pain, s/p CT guided drain placement for pelvic abscess 6/30, repeat drain placement RLQ and R transgluteal pelvic drain 7/7. Pt also with hydroureteronephrosis secondary to L ureteral stone, s/p L ureteral stent placement and L retrograde pyelogram 7/11. PMH includes  HTN, HLD, glaucoma and prior tobacco abuse.    PT Comments  Pt received sitting EOB and agreeable to session. Pt reports feeling better today and requests to use the bathroom at the beginning of the session. Pt able to tolerate increased gait distance with and without UE support. Pt demonstrates improved posture and slightly increased instability without UE support, but no LOB.  Pt left with transport at end of session. Pt continues to benefit from PT services to progress toward functional mobility goals.    If plan is discharge home, recommend the following: A little help with walking and/or transfers;A little help with bathing/dressing/bathroom;Assistance with cooking/housework;Direct supervision/assist for medications management;Assist for transportation;Help with stairs or ramp for entrance   Can travel by private vehicle        Equipment Recommendations  Rolling walker (2 wheels)    Recommendations for Other Services       Precautions / Restrictions Precautions Precautions: Fall;Other (comment) Recall of Precautions/Restrictions: Intact Precaution/Restrictions Comments: JP drain and ostomy bag Restrictions Weight Bearing Restrictions Per Provider Order: No     Mobility  Bed Mobility Overal bed mobility: Needs Assistance Bed Mobility: Sit to Supine       Sit to supine: Min assist    General bed mobility comments: Min A for BLE elevation back to EOB    Transfers Overall transfer level: Needs assistance Equipment used: Rolling walker (2 wheels) Transfers: Sit to/from Stand Sit to Stand: Supervision                Ambulation/Gait Ambulation/Gait assistance: Contact guard assist Gait Distance (Feet): 250 Feet Assistive device: None, IV Pole Gait Pattern/deviations: Decreased stride length, Step-through pattern, Trunk flexed Gait velocity: decreased     General Gait Details: slow, guarded gait with UE support on IV pole initially progressing to no UE support. CGA for safety, but no LOB   Stairs             Wheelchair Mobility     Tilt Bed    Modified Rankin (Stroke Patients Only)       Balance Overall balance assessment: Needs assistance Sitting-balance support: Feet supported Sitting balance-Leahy Scale: Good     Standing balance support: During functional activity, No upper extremity supported, Single extremity supported Standing balance-Leahy Scale: Fair Standing balance comment: with IV pole and no AD                            Communication Communication Communication: No apparent difficulties  Cognition Arousal: Alert Behavior During Therapy: WFL for tasks assessed/performed   PT - Cognitive impairments: No apparent impairments                         Following commands: Intact      Cueing Cueing Techniques: Verbal cues  Exercises      General Comments  Pertinent Vitals/Pain Pain Assessment Pain Assessment: Faces Faces Pain Scale: Hurts a little bit Pain Location: abdomen, JP drain site Pain Descriptors / Indicators: Discomfort, Guarding, Grimacing Pain Intervention(s): Monitored during session, Repositioned     PT Goals (current goals can now be found in the care plan section) Acute Rehab PT Goals Patient Stated Goal: to get better PT Goal Formulation: With patient Time For  Goal Achievement: 09/04/23 Progress towards PT goals: Progressing toward goals    Frequency    Min 3X/week       AM-PAC PT 6 Clicks Mobility   Outcome Measure  Help needed turning from your back to your side while in a flat bed without using bedrails?: None Help needed moving from lying on your back to sitting on the side of a flat bed without using bedrails?: A Little Help needed moving to and from a bed to a chair (including a wheelchair)?: A Little Help needed standing up from a chair using your arms (e.g., wheelchair or bedside chair)?: A Little Help needed to walk in hospital room?: A Little Help needed climbing 3-5 steps with a railing? : A Little 6 Click Score: 19    End of Session   Activity Tolerance: Patient tolerated treatment well Patient left: with call bell/phone within reach;with family/visitor present;in bed;with nursing/sitter in room Nurse Communication: Mobility status PT Visit Diagnosis: Other abnormalities of gait and mobility (R26.89);Pain     Time: 1122-1139 PT Time Calculation (min) (ACUTE ONLY): 17 min  Charges:    $Gait Training: 8-22 mins PT General Charges $$ ACUTE PT VISIT: 1 Visit                     Darryle George, PTA Acute Rehabilitation Services Secure Chat Preferred  [phone]    Darryle George 08/29/2023, 1:22 PM

## 2023-08-29 NOTE — Progress Notes (Signed)
 PHARMACY - TOTAL PARENTERAL NUTRITION CONSULT NOTE  Indication: intolerance to feeding, recurrent ileus  Patient Measurements: Height: 5' 4 (162.6 cm) Weight: 59.1 kg (130 lb 4.7 oz) IBW/kg (Calculated) : 54.7 TPN AdjBW (KG): 61.2 Body mass index is 22.36 kg/m. Usual Weight: ~59 kg  Assessment:  72 YOF s/p exp lap with Hartmann's procedure for stercoral colitis with ischemia and perforation with feculent peritonitis on 6/22. Noted pt has been NPO/clear liquid/full liquid diet since after surgery with limited intake. Pt now with ileus on Xray (dilated loops of small bowel) and N/V with liquid diet so NGT replaced 7/4, now NPO and starting TPN. Pt at risk of refeeding with prolonged period of little PO intake since admission.  Glucose / Insulin : no hx DM - CBGs < 180, off insulin  Electrolytes: K 3.8 up s/p replacement 60 kcl outside of TPN 7/20 (goal >/= 4 for ileus), Mag 1.9 up s/p replacement 7/20 (goal >/= 2 for ileus), others WNL Renal: SCr < 1, BUN 1 Hepatic: LFTs / tbili WNL, albumin  1.9, TG trended down to 87 Intake / Output; MIVF: UOP not reliably documented, drain and ostomy not charted, 250 mL NGT out  GI Imaging: 7/5 abd xray - persistent dilated loops of small bowel 7/5 CT - pelvic fluid collection slightly enlarged, suspicious for peritonitis, partially obstructing calculus in L ureter, ileus 7/16 CT small bowel obstruction 7/17 abd XR - SBO 7/19 abd XR - enteric tube in stomach, improving bowel gas pattern, oral contrast in R colon  7/20 Abd xr -  Large bowel oral contrast has continued distally. Mildly dilated left abdominal small bowel loops, but gas pattern still improved GI Surgeries / Procedures:  6/22 ex-lap with Hartmann's procedure for stercoral colitis with ischemia and perf with feculent peritonitis 7/8 CT-guided RLQ abd abscess and transgluteal pelvic drains [>416mL tan colored fluid] 7/10 NGT removed  7/11 NGT replaced  7/16 ureteral stent placement    Central access: PICC placed 08/12/23 TPN start date: 08/12/23  Nutritional Goals: Goal TPN 70 ml/hr will provide 89g AA, 1660 kCal   RD Estimated Needs Total Energy Estimated Needs: 1650-1850 kcals Total Protein Estimated Needs: 80-90 g Total Fluid Estimated Needs: >/= 1.7 L  Current Nutrition:  TPN 7/05 >> - NGT clamp trials started 7/19  CLD 7/20 > 7/22 FLD 7/22 >>   Plan:  *** Continue TPN rate at 70 ml/hr to provide 100% of need Electrolytes in TPN: Na 140 mEq/L, K 80 mEq/L, Ca 2 mEq/L, Mg 15 mEq/L (max), Phos 9 mmol/L, Cl:Ac 1:1  Add multivitamin trace elements to TPN  Monitor TPN labs on Mon/Thurs - labs in AM  Thank you for allowing pharmacy to be a part of this patient's care.  Shelba Collier, PharmD, BCPS Clinical Pharmacist

## 2023-08-29 NOTE — Consult Note (Signed)
 Chief Complaint: Drain malposition  Referring Provider(s): Dr. Ann  Supervising Physician: Vanice Revel  Patient Status: [facility] - In-pt  History of Present Illness: Angela [name] is a 72 y.o. female with a h/o HTN, HLD, glaucoma who recently underwent ex lap with sigmoidectomy and Hartmann's procedure on 6/22 with Dr. KANDICE Idler. She was started on IV antibiotics, her diet was gradually advanced, and patient initially seemed to be recovering well, but CT on 6/29 revealed complex free fluid w/ air in the surgical bed concerning for superimposed infection vs bowel leak. IR consulted for percutaneous drain placement.    Dr. Philip placed a drain into the fluid collection on 6/30.   A CT on 7/5 was notable for enlarged fluid collection. Found that TG drain was not having any output despite image read that it appeared in place.    Patient returned to IR 7/7. Dr. Philip found that drain had become retracted. He removed the old TG drain and replaced it with two drains: R TG 12Fr and RLQ 12 Fr.   On 7/17 the RLQ drain was removed and R TG injected, remained with bulb suction.   IR consulted to reevaluate drain. At bedside, pt reports she has started feeling a burning sensation at R TG insertion over the past couple of days. She reports no output from drain today (all fluid present on exam is from yesterday) and that she noticed leakage around insertion on sheets. Ext suture appeared displaced on exam.   A CT was obtained today which supports suspicion that drain has again become retracted. Dr. Vanice recommends drain injection with exchange/reposition tomorrow.   Does not wear CPAP or use supplemental home O2.  No longer receiving TF. Aware she will be NPO at [facility].   Denies fever, chills, SOB, CP, sore throat, N/V, abd pain, blood in stool or urine, abnormal bruising, leg swelling, back pain.   Allergies Reviewed:  Codeine   Patient is Full Code  Past Medical History:  Diagnosis Date    Arthritis    Blood in stool    greater than 4 years since last incidence of blood in stool    Colon polyps    Glaucoma    Hyperlipidemia    Hypertension    Inclusion cyst 2021   face   PNA (pneumonia) 2006   Strain of left wrist 04/16/2019    Past Surgical History:  Procedure Laterality Date   ABSCESS DRAINAGE  2021   face- inclusion cyst   BREAST BIOPSY Left 07/19/2018   US  guided breast bx with clip placement- benign   COLON RESECTION SIGMOID  07/30/2023   Procedure: COLECTOMY, SIGMOID, OPEN;  Surgeon: Idler Cordella LABOR, MD;  Location: [facility] OR;  Service: General;;   CYSTOSCOPY W/ URETERAL STENT PLACEMENT Left 08/18/2023   Procedure: CYSTOSCOPY, WITH RETROGRADE PYELOGRAM AND URETERAL STENT INSERTION;  Surgeon: Lovie Arlyss CROME, MD;  Location: [facility] OR;  Service: Urology;  Laterality: Left;   EYE SURGERY  1956   HEMORRHOID SURGERY  2001   IR CV LINE INJECTION  08/24/2023   LAPAROTOMY N/A 07/30/2023   Procedure: LAPAROTOMY, EXPLORATORY;  Surgeon: Idler Cordella LABOR, MD;  Location: [facility] OR;  Service: General;  Laterality: N/A;   RADIOACTIVE SEED GUIDED EXCISIONAL BREAST BIOPSY Left 03/15/2019   Procedure: RADIOACTIVE SEED GUIDED EXCISIONAL LEFT  BREAST BIOPSY;  Surgeon: Gladis Cough, MD;  Location: [facility];  Service: General;  Laterality: Left;   TONSILLECTOMY  1963   WISDOM TOOTH EXTRACTION  Medications: Prior to Admission medications   Medication Sig Start Date End Date Taking? Authorizing Provider  albuterol  (VENTOLIN  HFA) 108 (90 Base) MCG/ACT inhaler Inhale 2 puffs into the lungs every 6 (six) hours as needed for wheezing or shortness of breath. 03/03/22  Yes Kuneff, Renee A, DO  Calcium  Polycarbophil (FIBER-CAPS PO) Take 1 capsule by mouth daily at 12 noon.   Yes [provider]  HYDROcodone -acetaminophen  (NORCO/VICODIN) 5-325 MG tablet Take 1 tablet by mouth every 6 (six) hours as needed for moderate pain (pain score 4-6). 07/29/23  Yes Zackowski,  Scott, MD  hydrocortisone  (ANUSOL -HC) 25 MG suppository Place 1 suppository (25 mg total) rectally 2 (two) times daily. 06/23/23  Yes Kuneff, Renee A, DO  hydrocortisone  cream 1 % Apply 1 Application topically 2 (two) times daily. 06/08/23  Yes Kuneff, Renee A, DO  latanoprost  (XALATAN ) 0.005 % ophthalmic solution Place 1 drop into both eyes at bedtime. 08/11/17  Yes [provider]  LINZESS 72 MCG capsule Take 72 mcg by mouth daily before breakfast. 06/26/23  Yes [provider]  lisinopril  (ZESTRIL ) 10 MG tablet TAKE 1 TABLET BY MOUTH DAILY 05/22/23  Yes Kuneff, Renee A, DO  Multiple Vitamin (MULTIVITAMIN) tablet Take 1 tablet by mouth daily.   Yes [provider]  ondansetron  (ZOFRAN -ODT) 4 MG disintegrating tablet Take 1 tablet (4 mg total) by mouth every 8 (eight) hours as needed for nausea or vomiting. 07/29/23  Yes Zackowski, Scott, MD  Probiotic Product (DAILY PROBIOTIC PO) Take 1 capsule by mouth daily at 12 noon.   Yes [provider]  timolol  (TIMOPTIC ) 0.5 % ophthalmic solution Place 1 drop into both eyes 2 (two) times daily. 11/14/19  Yes [provider]  amoxicillin -clavulanate (AUGMENTIN ) 875-125 MG tablet Take 1 tablet by mouth every 12 (twelve) hours. Patient not taking: Reported on 07/31/2023 07/29/23   Zackowski, Scott, MD  atorvastatin  (LIPITOR) 20 MG tablet Take 1 tablet (20 mg total) by mouth at bedtime. Patient not taking: Reported on 02/22/2023 11/30/21   Catherine Charlies LABOR, DO     Family History  Problem Relation Age of Onset   Heart attack Father    Heart attack Sister    Hyperlipidemia Sister    Hypertension Sister     Social History   Socioeconomic History   Marital status: Married    Spouse name: Not on file   Number of children: Not on file   Years of education: Not on file   Highest education level: Bachelor's degree (e.g., BA, AB, BS)  Occupational History   Not on file  Tobacco Use   Smoking status: Former    Current  packs/day: 0.00    Types: Cigars, Cigarettes   Smokeless tobacco: Never   Tobacco comments:    *occasional smoker   Vaping Use   Vaping status: Former  Substance and Sexual Activity   Alcohol use: Yes    Alcohol/week: 4.0 standard drinks of alcohol    Types: 4 Glasses of wine per week    Comment: *rarely    Drug use: No   Sexual activity: Yes    Partners: Male    Comment: Married  Other Topics Concern   Not on file  Social History Narrative   Married. Retired. 2 children.   Bachelors degree.   Exercises routinely.   Drinks caffeine.   Smoke alarm in the home. Wears her seatbelt. Owns firearms.   Feels safe in her relationships.   Social Drivers of Dispensing optician  Resource Strain: Patient Declined (04/19/2023)   Overall Financial Resource Strain (CARDIA)    Difficulty of Paying Living Expenses: Patient declined  Food Insecurity: Patient Declined (08/02/2023)   Hunger Vital Sign    Worried About Running Out of Food in the Last Year: Patient declined    Ran Out of Food in the Last Year: Patient declined  Transportation Needs: Patient Declined (08/02/2023)   PRAPARE - Administrator, Civil Service (Medical): Patient declined    Lack of Transportation (Non-Medical): Patient declined  Physical Activity: Sufficiently Active (04/19/2023)   Exercise Vital Sign    Days of Exercise per Week: 5 days    Minutes of Exercise per Session: 30 min  Stress: No Stress Concern Present (04/19/2023)   [employer] of Occupational Health - Occupational Stress Questionnaire    Feeling of Stress : Not at all  Social Connections: Patient Declined (08/02/2023)   Social Connection and Isolation Panel    Frequency of Communication with Friends and Family: Patient declined    Frequency of Social Gatherings with Friends and Family: Patient declined    Attends Religious Services: Patient declined    Database administrator or Organizations: Patient declined    Attends Tax inspector Meetings: Patient declined    Marital Status: Patient declined     Review of Systems: A 12 point ROS discussed and pertinent positives are indicated in the HPI above.  All other systems are negative.  Review of Systems  Constitutional:  Negative for chills and fever.  Respiratory:  Negative for shortness of breath.   Cardiovascular:  Negative for chest pain.  Gastrointestinal:  Negative for vomiting.       Abd soreness  Genitourinary:  Negative for hematuria.  Neurological:  Negative for dizziness.  Psychiatric/Behavioral:  Negative for confusion.     Vital Signs: BP (!) 161/79 (BP Location: Left Arm)   Pulse 71   Temp 97.7 F (36.5 C) (Oral)   Resp 20   Ht 5' 4 (1.626 m)   Wt 130 lb 4.7 oz (59.1 kg)   SpO2 97%   BMI 22.36 kg/m     Physical Exam Cardiovascular:     Rate and Rhythm: Normal rate and regular rhythm.     Heart sounds: Normal heart sounds.  Pulmonary:     Effort: Pulmonary effort is normal.     Breath sounds: Normal breath sounds.  Abdominal:     Palpations: Abdomen is soft.     Comments: Ostomy present , midline dressing present (c/d/I) , R TG drain present with ext suture displaced.   Skin:    General: Skin is warm and dry.  Neurological:     General: No focal deficit present.     Mental Status: She is alert and oriented to person, place, and time.  Psychiatric:        Mood and Affect: Mood normal.        Behavior: Behavior normal.     Imaging: CT ABDOMEN PELVIS W CONTRAST Result Date: 08/29/2023 CLINICAL DATA:  Follow-up drainage. EXAM: CT ABDOMEN AND PELVIS WITH CONTRAST TECHNIQUE: Multidetector CT imaging of the abdomen and pelvis was performed using the standard protocol following bolus administration of intravenous contrast. RADIATION DOSE REDUCTION: This exam was performed according to the departmental dose-optimization program which includes automated exposure control, adjustment of the mA and/or kV according to patient size  and/or use of iterative reconstruction technique. CONTRAST:  75mL OMNIPAQUE  IOHEXOL  350 MG/ML SOLN COMPARISON:  CT abdomen  pelvis dated 08/23/2023. FINDINGS: Lower chest: Bibasal subpleural atelectasis. There is coronary vascular calcification. No intra-abdominal free air.  Trace subhepatic free fluid. Hepatobiliary: Small cyst in the left lobe of the liver. No biliary dilatation. The gallbladder is unremarkable. Pancreas: Unremarkable. No pancreatic ductal dilatation or surrounding inflammatory changes. Spleen: Normal in size without focal abnormality. Adrenals/Urinary Tract: The adrenal glands are unremarkable. Mild left hydronephrosis, progressed since the prior CT. Left ureteral stent with proximal tip in the interpolar collecting system and distal end in the urinary bladder. There is a 1 cm stone in the distal left ureter along the catheter is similar location as the prior CT. No hydronephrosis on the right. Small bilateral renal cysts or dilated calyx. The urinary bladder is grossly unremarkable. Stomach/Bowel: Postsurgical changes of the bowel with a left anterior colostomy. Mild dilatation of several small bowel loops measure up to approximately 3 cm. A gradual transition in the anterior right pelvis noted. Findings favor to represent an ileus. A small-bowel obstruction secondary to peritoneal adhesions is considered less likely. Small-bowel series may provide better evaluation if clinically indicated. The appendix is unremarkable as visualized. Vascular/Lymphatic: Advanced aortoiliac atherosclerotic disease. The IVC is unremarkable. No portal venous gas. There is no adenopathy. Reproductive: The uterus is grossly unremarkable. No suspicious adnexal masses. Other: Right transgluteal pigtail drainage catheter with tip in the posterior pelvis. There is a 5.3 x 2.9 cm loculated collection just superior to the pigtail tip of the catheter in the posterior pelvis, relatively similar to prior CT. There has been  interval removal of the right anterior percutaneous drainage catheter. Musculoskeletal: Degenerative changes of the spine. No acute osseous pathology. Midline vertical anterior pelvic wall open wound. IMPRESSION: 1. Right transgluteal pigtail drainage catheter with tip in the posterior pelvis. A 5.3 x 2.9 cm loculated collection just superior to the pigtail tip of the catheter in the posterior pelvis, relatively similar to prior CT. 2. Mild left hydronephrosis, progressed since the prior CT. Left ureteral stent in place. A 1 cm stone in the distal left ureter along the catheter is similar location as the prior CT. 3. Postsurgical changes of the bowel with a left anterior colostomy. Mild dilatation of several small bowel loops favor to represent an ileus. A small-bowel obstruction secondary to peritoneal adhesions is considered less likely. Small-bowel series may provide better evaluation if clinically indicated. 4.  Aortic Atherosclerosis (ICD10-I70.0). Electronically Signed   By: Vanetta Chou M.D.   On: 08/29/2023 14:33   DG Abd Portable 1V Result Date: 08/29/2023 CLINICAL DATA:  Small bowel obstruction. EXAM: PORTABLE ABDOMEN - 1 VIEW COMPARISON:  08/28/2023 FINDINGS: NG tube tip is positioned in the proximal stomach. Side port of the NG tube is in the region of the GE junction in tube could be advanced 3-4 cm to ensure side port placement below the GE junction as clinically warranted. Left internal ureteral stent device again noted. Percutaneous pigtail drain overlies the right abdomen and pelvis with formed loop overlying the central upper pelvis. Diffuse gaseous bowel distension noted with some small bowel loops measuring up to 3.6 cm diameter. IMPRESSION: NG tube tip is positioned in the proximal stomach. Side port of the NG tube is in the region of the GE junction and tube could be advanced 3-4 cm to ensure side port placement below the GE junction as clinically Diffuse gaseous bowel distension.  Electronically Signed   By: Camellia Candle M.D.   On: 08/29/2023 07:02   DG Abd Portable 1V Result Date: 08/28/2023  CLINICAL DATA:  73 year old female status post exploratory laparotomy. Abdominal pain. Small-bowel obstruction. EXAM: PORTABLE ABDOMEN - 1 VIEW COMPARISON:  Abdomen radiographs yesterday and earlier. FINDINGS: Portable AP supine view at 0724 hours. Stable enteric tube, left double-J ureteral stent, right percutaneous pelvic drain. Non obstructed bowel gas pattern. Stable visualized osseous structures. Negative lung bases. IMPRESSION: 1. Non obstructed bowel gas pattern. 2.  Stable lines and tubes. Electronically Signed   By: VEAR Hurst M.D.   On: 08/28/2023 07:44   DG Abd Portable 1V Result Date: 08/27/2023 CLINICAL DATA:  73 year old female status post exploratory laparotomy. Abdominal pain. Small-bowel obstruction. EXAM: PORTABLE ABDOMEN - 1 VIEW COMPARISON:  Abdominal radiographs yesterday and earlier. FINDINGS: Portable AP supine view at 0636 hours. Stable lines and tubes in the abdomen and pelvis. Negative lung bases. Stable visualized osseous structures. Oral contrast previously in the right colon has continued distally. Mild left abdominal small bowel gas use distension now, 3.7 cm. Bowel-gas pattern remains improved compared to the CT scout view on 08/23/2023. no definite pneumoperitoneum on these supine views. IMPRESSION: 1. Large bowel oral contrast has continued distally. Mildly dilated left abdominal small bowel loops, but gas pattern still improved compared to recent CT Abdomen and Pelvis. 2. Stable lines and tubes. Electronically Signed   By: VEAR Hurst M.D.   On: 08/27/2023 06:59   DG Abd Portable 1V Result Date: 08/26/2023 CLINICAL DATA:  73 year old female status post exploratory laparotomy. Abdominal pain. Small-bowel obstruction. EXAM: PORTABLE ABDOMEN - 1 VIEW COMPARISON:  Abdominal radiographs yesterday, postoperative CT Abdomen and Pelvis 08/23/2023. FINDINGS: Portable AP supine  views at 0629 hours. Satisfactory enteric tube termination in the stomach now, side hole at the gastric cardia level. Stable left double-J ureteral stent. Stable right pelvic pigtail drain. Bowel-gas pattern appears improved compared to the scout view on 08/23/2023. Oral contrast in the right colon similar to the radiographs yesterday. Stable, negative lung bases. Stable visualized osseous structures. IMPRESSION: 1. Satisfactory enteric tube placement into the stomach. Otherwise stable lines and tubes. 2. Improving bowel gas pattern since the CT on 08/23/2023. Stable oral contrast in the right colon. Electronically Signed   By: VEAR Hurst M.D.   On: 08/26/2023 07:19   CT GUIDED PERITONEAL/RETROPERITONEAL FLUID DRAIN BY PERC CATH Addendum Date: 08/25/2023 ADDENDUM REPORT: 08/25/2023 13:50 ADDENDUM: This procedure was performed on 08/14/2023. Electronically Signed   By: Juliene Balder M.D.   On: 08/25/2023 13:50   Result Date: 08/25/2023 INDICATION: 73 year old with history of exploratory laparotomy with Hartmann's procedure for stercoral colitis with ischemia perforation. Patient has postoperative fluid collections. Previously placed right transgluteal drain has retracted and no longer appropriately positioned. EXAM: 1. CT-guided placement of right lower quadrant abdominal abscess drain 2. CT-guided placement of right transgluteal pelvic drain TECHNIQUE: Multidetector CT imaging of the abdomen and pelvis was performed following the standard protocol without IV contrast. RADIATION DOSE REDUCTION: This exam was performed according to the departmental dose-optimization program which includes automated exposure control, adjustment of the mA and/or kV according to patient size and/or use of iterative reconstruction technique. MEDICATIONS: The patient is currently admitted to the hospital and receiving intravenous antibiotics. The antibiotics were administered within an appropriate time frame prior to the initiation of the  procedure. ANESTHESIA/SEDATION: Moderate (conscious) sedation was employed during this procedure. A total of Versed  3.5 mg and Fentanyl  175 mcg was administered intravenously by the radiology nurse. Total intra-service moderate Sedation Time: 83 minutes. The patient's level of consciousness and vital signs were monitored continuously by  radiology nursing throughout the procedure under my direct supervision. COMPLICATIONS: None immediate. PROCEDURE: Informed written consent was obtained from the patient after a thorough discussion of the procedural risks, benefits and alternatives. All questions were addressed. Maximal Sterile Barrier Technique was utilized including caps, mask, sterile gowns, sterile gloves, sterile drape, hand hygiene and skin antiseptic. A timeout was performed prior to the initiation of the procedure. Supine images were obtained of the abdomen and pelvis. Large fluid collection in the right lower quadrant was targeted. Right lower abdomen was prepped with chlorhexidine  and sterile field was created. Skin was anesthetized using 1% lidocaine . Small incision was made. Using CT guidance, an 18 gauge trocar needle was directed into the right lower quadrant fluid collection. Tan colored fluid was aspirated. Superstiff Amplatz wire was placed. Tract was dilated to accommodate a 12 [place] multipurpose drain. Greater than 400 mL tan colored fluid was removed from this drain. The drain was pulled back into the right lower quadrant in order to optimize drainage. Follow up CT images were obtained. Drain was sutured to skin and attached to a suction bulb. Patient was rolled onto her left side and additional CT images were obtained. The existing transgluteal drain was cut and completely removed. The right buttock was prepped with chlorhexidine  and sterile field was created. Skin was anesthetized using 1% lidocaine . Small incision was made. Using CT guidance, 18 gauge trocar needle was directed into the pelvic  fluid collection via a transgluteal approach. Tan colored fluid was aspirated. Superstiff Amplatz wire was placed. The tract was dilated to accommodate a 12 [place] multipurpose drain. 100 mL of tan colored fluid was removed. Drain was sutured to skin and attached to a suction bulb. FINDINGS: CT demonstrated multiple fluid collections in lower abdomen and pelvis. Large collections were in the right lower quadrant and in the pelvis. The existing transgluteal drain has retracted internally and no longer within the pelvic collection. Right lower quadrant drain was placed and greater than 400 mL of tan colored fluid was removed. New right transgluteal pelvic drain was placed. 100 mL of tan colored fluid was removed. IMPRESSION: 1. CT-guided placement of a right lower quadrant abdominal abscess drain. 2. Removal of the old right transgluteal pelvic drain. 3. CT-guided placement of a new right transgluteal pelvic drain. Electronically Signed: By: Juliene Balder M.D. On: 08/15/2023 10:11   CT GUIDED PERITONEAL/RETROPERITONEAL FLUID DRAIN BY PERC CATH Addendum Date: 08/25/2023 ADDENDUM REPORT: 08/25/2023 13:50 ADDENDUM: This procedure was performed on 08/14/2023. Electronically Signed   By: Juliene Balder M.D.   On: 08/25/2023 13:50   Result Date: 08/25/2023 INDICATION: 73 year old with history of exploratory laparotomy with Hartmann's procedure for stercoral colitis with ischemia perforation. Patient has postoperative fluid collections. Previously placed right transgluteal drain has retracted and no longer appropriately positioned. EXAM: 1. CT-guided placement of right lower quadrant abdominal abscess drain 2. CT-guided placement of right transgluteal pelvic drain TECHNIQUE: Multidetector CT imaging of the abdomen and pelvis was performed following the standard protocol without IV contrast. RADIATION DOSE REDUCTION: This exam was performed according to the departmental dose-optimization program which includes automated  exposure control, adjustment of the mA and/or kV according to patient size and/or use of iterative reconstruction technique. MEDICATIONS: The patient is currently admitted to the hospital and receiving intravenous antibiotics. The antibiotics were administered within an appropriate time frame prior to the initiation of the procedure. ANESTHESIA/SEDATION: Moderate (conscious) sedation was employed during this procedure. A total of Versed  3.5 mg and Fentanyl  175 mcg was administered  intravenously by the radiology nurse. Total intra-service moderate Sedation Time: 83 minutes. The patient's level of consciousness and vital signs were monitored continuously by radiology nursing throughout the procedure under my direct supervision. COMPLICATIONS: None immediate. PROCEDURE: Informed written consent was obtained from the patient after a thorough discussion of the procedural risks, benefits and alternatives. All questions were addressed. Maximal Sterile Barrier Technique was utilized including caps, mask, sterile gowns, sterile gloves, sterile drape, hand hygiene and skin antiseptic. A timeout was performed prior to the initiation of the procedure. Supine images were obtained of the abdomen and pelvis. Large fluid collection in the right lower quadrant was targeted. Right lower abdomen was prepped with chlorhexidine  and sterile field was created. Skin was anesthetized using 1% lidocaine . Small incision was made. Using CT guidance, an 18 gauge trocar needle was directed into the right lower quadrant fluid collection. Tan colored fluid was aspirated. Superstiff Amplatz wire was placed. Tract was dilated to accommodate a 12 [place] multipurpose drain. Greater than 400 mL tan colored fluid was removed from this drain. The drain was pulled back into the right lower quadrant in order to optimize drainage. Follow up CT images were obtained. Drain was sutured to skin and attached to a suction bulb. Patient was rolled onto her left  side and additional CT images were obtained. The existing transgluteal drain was cut and completely removed. The right buttock was prepped with chlorhexidine  and sterile field was created. Skin was anesthetized using 1% lidocaine . Small incision was made. Using CT guidance, 18 gauge trocar needle was directed into the pelvic fluid collection via a transgluteal approach. Tan colored fluid was aspirated. Superstiff Amplatz wire was placed. The tract was dilated to accommodate a 12 [place] multipurpose drain. 100 mL of tan colored fluid was removed. Drain was sutured to skin and attached to a suction bulb. FINDINGS: CT demonstrated multiple fluid collections in lower abdomen and pelvis. Large collections were in the right lower quadrant and in the pelvis. The existing transgluteal drain has retracted internally and no longer within the pelvic collection. Right lower quadrant drain was placed and greater than 400 mL of tan colored fluid was removed. New right transgluteal pelvic drain was placed. 100 mL of tan colored fluid was removed. IMPRESSION: 1. CT-guided placement of a right lower quadrant abdominal abscess drain. 2. Removal of the old right transgluteal pelvic drain. 3. CT-guided placement of a new right transgluteal pelvic drain. Electronically Signed: By: Juliene Balder M.D. On: 08/15/2023 10:11   IR INJECT INDWELLING DRAINAGE CATHETER Result Date: 08/25/2023 CLINICAL DATA:  Abscess drain follow-up. 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 RIGHT lower quadrant and transgluteal abscess drain placement on 08/14/2023 and 08/15/2023. EXAM: IR INJECT/THERA/INC NEEDLE/CATH/PLC EPI/LUMB/SAC W/IMG COMPARISON:  CT AP, 08/23/2023. CONTRAST:  10 mL Isovue -300-administered via the existing percutaneous drain. FLUOROSCOPY: Radiation Exposure Index and estimated peak skin dose (PSD); Reference air kerma (RAK), 0.7 mGy.  TECHNIQUE: The RIGHT lower quadrant drain up was removed prior to fluoroscopic evaluation. The patient was positioned prone on the fluoroscopy table. A preprocedural spot fluoroscopic image was obtained of the pelvis and the existing percutaneous drainage catheter. Multiple spot fluoroscopic and radiographic images were obtained following the injection of a small amount of contrast via the existing RIGHT transgluteal approach percutaneous drainage catheter. Procedure assisted by Tawni Lecher, IR RT under direct supervision of Thom Hall, MD FINDINGS: No significant residual abscess collection or obvious fistulous communication. IMPRESSION: 1.  No significant residual abscess collection or obvious fistulous communication. 2. The RIGHT lower quadrant drainage catheter was removed. 3. The RIGHT transgluteal approach drainage catheter was left in place, secondary to residual output volume. No significant abscess collection or obvious fistulous communication noted on fluoroscopic evaluation. Thom Hall, MD [facility] Electronically Signed   By: Thom Hall M.D.   On: 08/25/2023 09:36   DG Abd Portable 1V Result Date: 08/25/2023 EXAM: 1 VIEW XRAY OF THE ABDOMEN 08/25/2023 06:38:00 AM COMPARISON: 08/24/2023 CLINICAL HISTORY: [id number] SBO (small bowel obstruction) (HCC) [id number]. SBO. Pt complains of abdominal pain FINDINGS: BOWEL: Persistent dilated loops of small bowel noted in the left lower quadrant of the abdomen measuring up to 4.1 cm previously 4.0 cm. SOFT TISSUES: No abnormal calcifications or opaque urinary calculi. There is a right lower quadrant approach pigtail drainage catheter with distal end in the right paramidline pelvis. BONES: No acute osseous abnormality. LINES AND TUBES: There is an enteric tube with tip approximately 6.2 cm below the GE junction. The side port is 1.5 cm above the GE junction. Recommend advancing enteric tube.  Left-sided nephroureteral stent is again noted. IMPRESSION: 1. Persistent dilated loops of small bowel in the left lower quadrant, measuring up to 4.1 cm, previously 4.0 cm, consistent with small bowel obstruction. 2. Enteric tube with tip approximately 6.2 cm below the GE junction and side port 1.5 cm above the GE junction. Recommend advancing enteric tube. 3. Left-sided nephroureteral stent and right lower quadrant approach pigtail drainage catheter with distal end in the right paramidline pelvis. Electronically signed by: Waddell Calk MD 08/25/2023 07:19 AM EDT RP Workstation: [id number]   DG Abd Portable 1V Result Date: 08/24/2023 CLINICAL DATA:  [id number] Encounter for nasogastric (NG) tube placement [id number] EXAM: PORTABLE ABDOMEN - 1 VIEW COMPARISON:  None Available. FINDINGS: Esophagogastric tube terminates at the GE junction. Nonobstructive bowel gas pattern. No pneumoperitoneum. Partially visualized left ureteral stent. No acute fracture or destructive lesion. The lung bases are clear. IMPRESSION: Esophagogastric tube terminates in the region of the GE junction. The last side hole is in the distal esophagus and continued advancement is recommended. Electronically Signed   By: Rogelia Myers M.D.   On: 08/24/2023 11:48   DG Abd Portable 1V Result Date: 08/24/2023 CLINICAL DATA:  Evaluate small bowel obstruction. History of bowel perforation. EXAM: PORTABLE ABDOMEN - 1 VIEW COMPARISON:  08/23/2023 FINDINGS: There are 2 pigtail drainage catheters within the right paramidline pelvis. Left-sided nephroureteral stent is in place. Persistent dilated small bowel loops are identified within the left hemiabdomen which appears similar to the previous exam measuring up to 4 cm. IMPRESSION: 1. Persistent dilated small bowel loops within the left hemiabdomen compatible with small bowel obstruction. Electronically Signed   By: Waddell Calk M.D.   On: 08/24/2023 06:55   CT ABDOMEN PELVIS W CONTRAST Result Date:  08/23/2023 CLINICAL DATA:  Postoperative abdominal pain. Exploratory laparotomy with Hartmann's procedure. EXAM: CT ABDOMEN AND PELVIS WITH CONTRAST TECHNIQUE: Multidetector CT imaging of the abdomen and pelvis was performed using the standard protocol following bolus administration of intravenous contrast. RADIATION DOSE REDUCTION: This exam was performed according to the departmental dose-optimization program which includes automated exposure control, adjustment of the mA and/or kV according to patient size and/or use of iterative reconstruction technique. CONTRAST:  75mL OMNIPAQUE  IOHEXOL  350 MG/ML SOLN COMPARISON:  CT abdomen and pelvis 08/12/2023 FINDINGS: Lower chest: There is atelectasis in the lung bases. Hepatobiliary: There is a cyst in the  left lobe of the liver which appears unchanged measuring 15 mm. No new liver lesions are seen. Gallbladder and bile ducts are within normal limits. Pancreas: Unremarkable. No pancreatic ductal dilatation or surrounding inflammatory changes. Spleen: Normal in size without focal abnormality. Adrenals/Urinary Tract: Left ureteral stent in place. There is no hydronephrosis in either kidney. There are rounded hypodensities in both kidneys which are too small to characterize, likely cysts. There is no perinephric fat stranding. Adrenal glands are within normal limits. There is a small amount of air in the bladder. Stomach/Bowel: There are dilated small bowel loops throughout the mid abdomen with air-fluid levels. The stomach is also dilated with large air-fluid level. Transition point is seen in the right lower quadrant adjacent to the anterior abdominal wall image 2/60. Distal small bowel and colon are nondilated. Left lower quadrant colostomy is present. The appendix is within normal limits. Air-fluid levels are also scattered throughout the colon. Vascular/Lymphatic: Aorta and IVC are normal in size. There are atherosclerotic calcifications of the aorta. No enlarged lymph  nodes are seen. Reproductive: Uterus and bilateral adnexa are unremarkable. Other: There is no ascites.  There is presacral edema. There is a percutaneous drainage catheter in the anterior right pelvis. There is no associated fluid collection at the end of this catheter. Posterior sacroiliac approach percutaneous catheter is seen ending in the cul-de-sac. There is no definite fluid collection at the tip of the catheter; however, there is enhancing fluid collection just superior to this catheter containing air in the presacral region measuring 5.8 by 2.3 x 1.2 cm. Enhancing fluid collection containing air in the lateral left and anterior anterior pelvis has significantly decreased in size. The largest portion of the collection is now seen on the left image 2/56 measuring 1.4 by 5.0 cm. No free intraperitoneal air. Musculoskeletal: Degenerative changes affect the spine. IMPRESSION: 1. Small-bowel obstruction with transition point in the right lower quadrant. 2. Left lower quadrant colostomy. 3. Percutaneous drainage catheter in the anterior right pelvis without associated fluid collection. 4. Percutaneous drainage catheter in the posterior sacroiliac region ending in the cul-de-sac. There is no definite fluid collection at the tip of the catheter; however, there is a enhancing fluid collection containing air in the presacral region measuring 5.8 x 2.3 x 1.2 cm. 5. Enhancing fluid collection containing air in the lateral left and anterior pelvis has significantly decreased in size. The largest portion of the collection is now seen on the left measuring 1.4 x 5.0 cm. 6. Left ureteral stent in place. No hydronephrosis. 7. Small amount of air in the bladder, possibly related to recent instrumentation. Aortic Atherosclerosis (ICD10-I70.0). Electronically Signed   By: Greig Pique M.D.   On: 08/23/2023 17:57   DG Abd Portable 1V Result Date: 08/23/2023 CLINICAL DATA:  Nausea and vomiting EXAM: PORTABLE ABDOMEN - 1 VIEW  COMPARISON:  X-ray performed August 12, 2023 FINDINGS: Diffuse gaseous distention of bowel is present with the most distended loops present in the left abdomen, similar. There is been interval placement of an internalized left-sided nephroureteral stent. Additionally, right-sided drainage catheter material has been revised. Degenerative changes are present in the imaged osseous structures. IMPRESSION: 1. Diffuse gaseous distension of the bowel, similar when compared to the prior exam. Electronically Signed   By: Maude Naegeli M.D.   On: 08/23/2023 10:12   DG C-Arm 1-60 Min Result Date: 08/18/2023 CLINICAL DATA:  Cystoscopy and retrograde pyelogram with ureteral stent insertion EXAM: DG C-ARM 1-60 MIN FLUOROSCOPY: Fluoroscopy Time:  34.7 Radiation  Exposure Index (if provided by the fluoroscopic device): 4.91 mGy Number of Acquired Spot Images: 1 COMPARISON:  CT abdomen pelvis 08/12/2023 FINDINGS: Contrast within the dilated ureter and collecting system. Multiple dilated loops of small bowel. IMPRESSION: Contrast within the dilated ureter collecting system during retrograde pyelogram. Dilated loops of small bowel similar to 08/12/2023. Electronically Signed   By: Norman Gatlin M.D.   On: 08/18/2023 17:11   CT ABDOMEN PELVIS W CONTRAST Result Date: 08/12/2023 CLINICAL DATA:  Postop abdominal pain. Patient underwent exploratory laparotomy with Hartmann's procedure for stercoral colitis and perforation. Subsequent pelvic percutaneous drain placement for abscess. EXAM: CT ABDOMEN AND PELVIS WITH CONTRAST TECHNIQUE: Multidetector CT imaging of the abdomen and pelvis was performed using the standard protocol following bolus administration of intravenous contrast. RADIATION DOSE REDUCTION: This exam was performed according to the departmental dose-optimization program which includes automated exposure control, adjustment of the mA and/or kV according to patient size and/or use of iterative reconstruction technique.  CONTRAST:  75mL OMNIPAQUE  IOHEXOL  350 MG/ML SOLN COMPARISON:  Abdominopelvic CT 08/06/2023. FINDINGS: Lower chest: Trace left pleural effusion and mild bibasilar atelectasis again noted, similar to previous CT. There is atherosclerosis of the aorta and coronary arteries. Hepatobiliary: The liver is normal in density without suspicious focal abnormality. Stable hepatic cysts. No evidence of gallstones, gallbladder wall thickening or biliary dilatation. Pancreas: Unremarkable. No pancreatic ductal dilatation or surrounding inflammatory changes. Spleen: Normal in size without focal abnormality. Adrenals/Urinary Tract: Both adrenal glands appear normal. There is a persistent partially obstructing calculus in the distal left ureter, measuring 11 mm on coronal image 65/7. Resulting hydronephrosis and hydroureter with delayed contrast excretion. No evidence of right ureteral obstruction. There are small renal cysts bilaterally for which no specific follow-up imaging is recommended. The bladder appears unremarkable for its degree of distention. Stomach/Bowel: No enteric contrast administered. Nasogastric tube extends into the mid stomach. The stomach is decompressed. Multiple fluid-filled loops of dilated small bowel are again noted, without focal transition point or significant wall thickening. The appendix is not clearly visualized, although no pericecal inflammatory changes are identified. Colonic stool burden has improved compared with the previous study. The colon is relatively decompressed proximal to the sigmoid colostomy. The rectal pouch is fluid-filled and mildly distended. Vascular/Lymphatic: There are no enlarged abdominal or pelvic lymph nodes. Aortic and branch vessel atherosclerosis without evidence of aneurysm or large vessel occlusion. The portal, superior mesenteric and splenic veins are patent. Reproductive: The uterus and ovaries appear unremarkable. No evidence of adnexal mass. Other: Open midline  anterior abdominal wall incision. New right transgluteal percutaneous drain appears appropriately position within the previously demonstrated complex pelvic fluid collection. However, this collection remains sizable and has slightly enlarged compared with the previous study, measuring 8.0 x 6.7 cm on image 65/4. This collection likely communicates with an adjacent collection along the left pelvic sidewall, measuring 7.0 x 2.9 cm on image 68/4 and abuts the rectal pouch suture line. There are other less well-defined but enlarging peritoneal fluid collections with associated air bubbles and peritoneal enhancement, highly suspicious for peritonitis. A component inferior to the cecum measures up to 8.0 x 6.0 cm on image 58/4, and there is a component inferior to the colostomy measuring 7.4 x 5.3 cm on image 52/4. Musculoskeletal: No acute or significant osseous findings. Multilevel spondylosis. No evidence of discitis or osteomyelitis. IMPRESSION: 1. New right transgluteal percutaneous drain appears appropriately positioned within the previously demonstrated complex pelvic fluid collection. However, this collection remains sizable and has slightly enlarged  compared with the previous study. This abuts the rectal suture line and a leak in this area cannot be excluded. 2. There are other less well-defined but enlarging peritoneal fluid collections with associated air bubbles and peritoneal enhancement, highly suspicious for peritonitis. 3. Persistent partially obstructing 11 mm calculus in the distal left ureter with resulting hydronephrosis and hydroureter. 4. Persistent fluid-filled loops of dilated small bowel without focal transition point, likely ileus. 5. No other significant parenchymal findings. 6.  Aortic Atherosclerosis (ICD10-I70.0). 7. These results will be called to the ordering clinician or representative by the Radiologist Assistant, and communication documented in the PACS or [employer].  Electronically Signed   By: Elsie Perone M.D.   On: 08/12/2023 19:06   US  EKG SITE RITE Result Date: 08/12/2023 If Site Rite image not attached, placement could not be confirmed due to current cardiac rhythm.  DG Abd 2 Views Result Date: 08/12/2023 CLINICAL DATA:  Follow-up ileus EXAM: ABDOMEN - 2 VIEW COMPARISON:  08/11/2023 FINDINGS: Enteric tube tip and side port are within the gastric fundus. Percutaneous drainage catheter enters from a right lower quadrant approach and terminates in the right hemipelvis. Persistent dilated loops of small bowel within the left hemiabdomen measure up to 4.3 cm, similar to previous exam. No signs of pneumoperitoneum. IMPRESSION: Persistent dilated loops of small bowel within the left hemiabdomen, similar to previous exam. Electronically Signed   By: Waddell Calk M.D.   On: 08/12/2023 09:17   DG Abd 1 View Result Date: 08/11/2023 CLINICAL DATA:  NG tube placement EXAM: ABDOMEN - 1 VIEW COMPARISON:  08/11/2023 FINDINGS: NG tube tip is in the fundus of the stomach. Dilated small bowel loops. Gas within the colon, but the colon appears to be decompressed. No organomegaly or free air. IMPRESSION: NG tube tip in the stomach. Gaseous distension of multiple small bowel loops, similar to prior study. Electronically Signed   By: Franky Crease M.D.   On: 08/11/2023 18:41   DG Abd 1 View Result Date: 08/11/2023 CLINICAL DATA:  Follow-up ileus EXAM: ABDOMEN - 1 VIEW COMPARISON:  08/06/2023 radiograph and CT FINDINGS: Few air dilated loops of small bowel measuring up to 4 cm with scattered colon gas. Left lower quadrant ostomy. Drainage catheter in the right pelvis. Decreased stool in the colon. IMPRESSION: Few air dilated loops of small bowel with scattered colon gas, suggesting ileus, findings do not appear greatly changed compared to scout image from the CT 08/06/2023. Electronically Signed   By: Luke Bun M.D.   On: 08/11/2023 14:15   ECHOCARDIOGRAM COMPLETE Result Date:  08/07/2023    ECHOCARDIOGRAM REPORT   Patient Name:   JAX ABDELRAHMAN Date of Exam: 08/07/2023 Medical Rec #:  [id number]      Height:       64.0 in Accession #:    [id number]     Weight:       136.2 lb Date of Birth:  1950-02-20     BSA:          1.662 m Patient Age:    72 years       BP:           137/59 mmHg Patient Gender: F              HR:           79 bpm. Exam Location:  Inpatient Procedure: 2D Echo, Cardiac Doppler and Color Doppler (Both Spectral and Color  Flow Doppler were utilized during procedure). Indications:    CHF  History:        Patient has no prior history of Echocardiogram examinations.                 CHF; Risk Factors:Hypertension.  Sonographer:    Vella Key Referring Phys: BRAYTON GORMAN LYE IMPRESSIONS  1. Left ventricular ejection fraction, by estimation, is 60 to 65%. The left ventricle has normal function. The left ventricle has no regional wall motion abnormalities. There is mild concentric left ventricular hypertrophy. Left ventricular diastolic parameters were normal.  2. Right ventricular systolic function is normal. The right ventricular size is normal.  3. The mitral valve is normal in structure. Trivial mitral valve regurgitation. No evidence of mitral stenosis.  4. The aortic valve is normal in structure. Aortic valve regurgitation is not visualized. No aortic stenosis is present.  5. The inferior vena cava is normal in size with greater than 50% respiratory variability, suggesting right atrial pressure of 3 mmHg. FINDINGS  Left Ventricle: Left ventricular ejection fraction, by estimation, is 60 to 65%. The left ventricle has normal function. The left ventricle has no regional wall motion abnormalities. The left ventricular internal cavity size was normal in size. There is  mild concentric left ventricular hypertrophy. Left ventricular diastolic parameters were normal. Right Ventricle: The right ventricular size is normal. No increase in right ventricular wall thickness.  Right ventricular systolic function is normal. Left Atrium: Left atrial size was normal in size. Right Atrium: Right atrial size was normal in size. Pericardium: There is no evidence of pericardial effusion. Mitral Valve: The mitral valve is normal in structure. Trivial mitral valve regurgitation. No evidence of mitral valve stenosis. Tricuspid Valve: The tricuspid valve is normal in structure. Tricuspid valve regurgitation is mild . No evidence of tricuspid stenosis. Aortic Valve: The aortic valve is normal in structure. Aortic valve regurgitation is not visualized. No aortic stenosis is present. Pulmonic Valve: The pulmonic valve was normal in structure. Pulmonic valve regurgitation is trivial. No evidence of pulmonic stenosis. Aorta: The aortic root is normal in size and structure. Venous: The inferior vena cava is normal in size with greater than 50% respiratory variability, suggesting right atrial pressure of 3 mmHg. IAS/Shunts: No atrial level shunt detected by color flow Doppler.  LEFT VENTRICLE PLAX 2D LVIDd:         3.20 cm     Diastology LVIDs:         2.90 cm     LV e' medial:    8.70 cm/s LV PW:         1.00 cm     LV E/e' medial:  9.2 LV IVS:        0.90 cm     LV e' lateral:   9.46 cm/s LVOT diam:     1.80 cm     LV E/e' lateral: 8.5 LV SV:         78 LV SV Index:   47 LVOT Area:     2.54 cm  LV Volumes (MOD) LV vol d, MOD A2C: 65.8 ml LV vol d, MOD A4C: 69.6 ml LV vol s, MOD A2C: 25.6 ml LV vol s, MOD A4C: 30.6 ml LV SV MOD A2C:     40.2 ml LV SV MOD A4C:     69.6 ml LV SV MOD BP:      39.5 ml RIGHT VENTRICLE RV Basal diam:  2.90 cm RV S prime:  17.70 cm/s TAPSE (M-mode): 2.5 cm LEFT ATRIUM             Index        RIGHT ATRIUM           Index LA diam:        2.70 cm 1.62 cm/m   RA Area:     12.90 cm LA Vol (A2C):   41.9 ml 25.21 ml/m  RA Volume:   26.20 ml  15.77 ml/m LA Vol (A4C):   37.9 ml 22.81 ml/m LA Biplane Vol: 40.5 ml 24.37 ml/m  AORTIC VALVE LVOT Vmax:   152.00 cm/s LVOT Vmean:   109.000 cm/s LVOT VTI:    0.305 m  AORTA Ao Root diam: 3.00 cm Ao Asc diam:  3.20 cm MITRAL VALVE               TRICUSPID VALVE MV Area (PHT): 2.63 cm    TV Peak grad:   30.2 mmHg MV Decel Time: 288 msec    TV Vmax:        2.75 m/s MV E velocity: 80.20 cm/s MV A velocity: 99.60 cm/s  SHUNTS MV E/A ratio:  0.81        Systemic VTI:  0.30 m                            Systemic Diam: 1.80 cm Aditya Sabharwal Electronically signed by Ria Commander Signature Date/Time: 08/07/2023/3:37:59 PM    Final    CT GUIDED PERITONEAL/RETROPERITONEAL FLUID DRAIN BY PERC CATH Result Date: 08/07/2023 INDICATION: 73 year old with history of 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 Patient has postoperative fluid collections that are concerning for abscess. EXAM: CT-GUIDED PLACEMENT OF PELVIC ABSCESS DRAIN TECHNIQUE: Multidetector CT imaging of the pelvis was performed following the standard protocol without IV contrast. RADIATION DOSE REDUCTION: This exam was performed according to the departmental dose-optimization program which includes automated exposure control, adjustment of the mA and/or kV according to patient size and/or use of iterative reconstruction technique. MEDICATIONS: Moderate sedation ANESTHESIA/SEDATION: Moderate (conscious) sedation was employed during this procedure. A total of Versed  4 mg and Fentanyl  200 mcg was administered intravenously by the radiology nurse. Total intra-service moderate Sedation Time: 31 minutes. The patient's level of consciousness and vital signs were monitored continuously by radiology nursing throughout the procedure under my direct supervision. COMPLICATIONS: None immediate. PROCEDURE: Informed written consent was obtained from the patient after a thorough discussion of the procedural risks, benefits and alternatives. All questions were addressed. Maximal Sterile Barrier Technique was utilized including caps, mask, sterile  gowns, sterile gloves, sterile drape, hand hygiene and skin antiseptic. A timeout was performed prior to the initiation of the procedure. Patient was placed prone on the CT scanner. Images through the pelvis were obtained. Right buttock region was prepped with chlorhexidine  and sterile field was created. Skin and soft tissues were anesthetized with 1% lidocaine . Small incision was made. Using CT guidance, an 18 gauge trocar needle was directed into the pelvic fluid collection using a right transgluteal approach. Gray colored purulent fluid was aspirated. Superstiff Amplatz wire was advanced into the collection. Follow up CT images were obtained. The tract was dilated to accommodate a 12 [place] multipurpose drain. 100 mL of purulent fluid was aspirated. Follow up CT images were obtained. Drain was sutured to skin and attached to a suction bulb. Fluid was sent for culture. FINDINGS: 67 French drain placed from  a right transgluteal approach. 100 mL of purulent fluid was removed. IMPRESSION: CT-guided placement of a drain in the pelvic abscess. Electronically Signed   By: Juliene Balder M.D.   On: 08/07/2023 14:34   DG Abd Portable 1V Result Date: 08/06/2023 CLINICAL DATA:  NG tube placement. EXAM: PORTABLE ABDOMEN - 1 VIEW COMPARISON:  CT earlier today FINDINGS: Tip and side port of the enteric tube below the diaphragm in the stomach. Dilated bowel loops in the upper abdomen are partially included in the field of view. IMPRESSION: Tip and side port of the enteric tube below the diaphragm in the stomach. Electronically Signed   By: Andrea Gasman M.D.   On: 08/06/2023 20:14   CT ABDOMEN PELVIS W CONTRAST Addendum Date: 08/06/2023 ADDENDUM REPORT: 08/06/2023 16:20 ADDENDUM: Critical Value/emergent results were called by telephone at the time of interpretation on 08/06/2023 at 4:20 pm to provider [facility] , who verbally acknowledged these results. Electronically Signed   By: [name] Birmingham M.D.   On: 08/06/2023  16:20   Result Date: 08/06/2023 CLINICAL DATA:  History of bowel perforation postoperatively and hydronephrosis. Worsening leukocytosis. EXAM: CT ABDOMEN AND PELVIS WITH CONTRAST TECHNIQUE: Multidetector CT imaging of the abdomen and pelvis was performed using the standard protocol following bolus administration of intravenous contrast. RADIATION DOSE REDUCTION: This exam was performed according to the departmental dose-optimization program which includes automated exposure control, adjustment of the mA and/or kV according to patient size and/or use of iterative reconstruction technique. CONTRAST:  75mL OMNIPAQUE  IOHEXOL  350 MG/ML SOLN COMPARISON:  07/30/2023. FINDINGS: Lower chest: Multi-vessel coronary artery calcifications are noted. There is a small left pleural effusion. Atelectasis or infiltrate is present at the lung bases bilaterally, greater on the left than on the right. Hepatobiliary: Stable cysts are present in the left lobe of the liver. The gallbladder is without stones. No biliary ductal dilatation is seen. Pancreas: Unremarkable. No pancreatic ductal dilatation or surrounding inflammatory changes. Spleen: Normal in size without focal abnormality. Adrenals/Urinary Tract: The adrenal glands are within normal limits. There is a slightly delayed nephrogram on the left. No renal calculus is seen bilaterally. Scattered hypodensities are noted in the kidneys bilaterally, likely cysts. There is moderate hydronephrosis on the left with a 9 mm calculus in the distal left ureter at the pelvic inlet. No hydronephrosis on the right. The bladder is unremarkable. Stomach/Bowel: The stomach is distended with fluid. Multiple loops of distended small bowel are noted in the abdomen measuring up to 4.3 cm in the mid left abdomen. No definite transition point is seen. There is evidence of partial colectomy with left lower quadrant colostomy. Appendix appears normal. Vascular/Lymphatic: Aortic atherosclerosis. No  enlarged abdominal or pelvic lymph nodes. Reproductive: Uterus and bilateral adnexa are unremarkable. Other: Postsurgical changes are noted in the low anterior abdominal wall. Fat stranding, free air, and free fluid are present in the inferior abdomen and pelvis. There is a complex fluid collection containing air in the pelvis and presacral space. Mild anasarca is noted. Musculoskeletal: Degenerative changes are noted in the thoracolumbar spine. No acute osseous abnormality is seen. IMPRESSION: 1. Status post partial colectomy with left lower quadrant ostomy. Complex free fluid with multiple foci of air, most pronounced in the surgical bed. While findings may be postsurgical, possibility of superimposed infection or bowel leak cannot be excluded. 2. A few mildly distended loops of small bowel in the mid left abdomen with no obvious transition point, possible postoperative ileus versus early or partial small bowel obstruction. 3.  Small left pleural effusion with atelectasis or infiltrate at the lung bases. 4. Moderate left hydroureteronephrosis with a 9 mm stone in the distal left ureter at the pelvic inlet, unchanged. 5. Aortic atherosclerosis and coronary artery calcifications. Electronically Signed: By: [name] Birmingham M.D. On: 08/06/2023 16:10   DG Abd Portable 1V Result Date: 08/03/2023 CLINICAL DATA:  Distension EXAM: PORTABLE ABDOMEN - 1 VIEW COMPARISON:  CT 07/30/2023 FINDINGS: Interval left lower quadrant ostomy. Mildly dilated gas-filled bowel in the left lower quadrant measuring up to 3.6 cm. Moderate to large stool burden. IMPRESSION: Interval left lower quadrant ostomy. Mildly dilated gas-filled bowel in the left lower quadrant, possible mild postoperative ileus. Moderate to large stool burden. Electronically Signed   By: Luke Bun M.D.   On: 08/03/2023 14:52   US  RENAL Result Date: 08/01/2023 CLINICAL DATA:  Hydronephrosis EXAM: RENAL / URINARY TRACT ULTRASOUND COMPLETE COMPARISON:  CT abdomen  pelvis 07/30/2023 FINDINGS: Right Kidney: Renal measurements: 11.4 x 4.8 x 4.6 cm = volume: 131 mL. Echogenicity within normal limits. No mass or hydronephrosis visualized. Left Kidney: Renal measurements: 12.2 x 5.0 x 4.7 cm = volume: 152 mL. Echogenicity within normal limits. No mass or hydronephrosis visualized. Bladder: Visualized due to overlying bandages. Other: None. IMPRESSION: No hydronephrosis. Electronically Signed   By: Norman Gatlin M.D.   On: 08/01/2023 22:19    Labs:  CBC: Recent Labs    08/26/23 0430 08/27/23 0405 08/28/23 0450 08/29/23 0407  WBC 10.8* 8.0 12.2* 8.2  HGB 7.6* 7.0* 10.7* 10.4*  HCT 23.8* 21.9* 32.1* 32.0*  PLT 482* 417* 471* 422*    COAGS: Recent Labs    07/29/23 0826 08/07/23 0909 08/15/23 0431  INR 1.1 1.2 1.1    BMP: Recent Labs    08/26/23 0430 08/27/23 0405 08/28/23 0450 08/29/23 0407  NA 140 139 140 136  K 3.6 3.8 3.8 3.8  CL 107 105 105 105  CO2 27 27 26 25   GLUCOSE 109* 100* 99 96  BUN 18 17 17 19   CALCIUM  7.9* 8.0* 8.2* 8.5*  CREATININE 0.44 0.46 0.41* 0.41*  GFRNONAA >60 >60 >60 >60    LIVER FUNCTION TESTS: Recent Labs    08/26/23 0430 08/27/23 0405 08/28/23 0450 08/29/23 0407  BILITOT 0.2 0.2 0.5 0.4  AST 14* 14* 14* 12*  ALT 11 12 12 11   ALKPHOS 67 65 67 65  PROT 5.0* 4.7* 5.1* 5.2*  ALBUMIN  1.6* 1.6* 1.9* 2.0*    TUMOR MARKERS: No results for input(s): AFPTM, CEA, CA199, CHROMGRNA in the last 8760 hours.  Assessment and Plan:  Request for  image guided drain injection and possible exchange/reposition tomorrow (7/23) with Dr. Johann. Do not perform scheduled NS drain flushes tonight or in AM until after procedure.  No contraindications for procedure identified in ROS, physical exam, or review of pre-sedation considerations. Labs reviewed and within acceptable range 7/22 CT  imaging available and reviewed VSS, afebrile Lovenox  dose held in case repositioning more involved than  anticipated  Risks and benefits discussed with the patient including bleeding, infection, damage to adjacent structures, bowel perforation/fistula connection, and sepsis.  All of the patient's questions were answered, patient is agreeable to proceed. Consent signed and in IR procedure area.     Thank you for allowing our service to participate in Angela [name] 's care.    Electronically Signed: Laymon Coast, NP   08/29/2023, 4:05 PM     I spent a total of 20 Minutes    in face  to face in clinical consultation, greater than 50% of which was counseling/coordinating care for image guided drain injection  and possible exchange/reposition.    (A copy of this note was sent to the referring provider and the time of visit.)

## 2023-08-29 NOTE — Progress Notes (Addendum)
 PROGRESS NOTE        PATIENT DETAILS Name: Angela [name] Age: 73 y.o. Sex: female Date of Birth: 05-09-1950 Admit Date: 07/30/2023 Admitting Physician Maximino DELENA Sharps, MD ERE:[id number], [name], DO  Brief Summary: Patient is a 73 y.o.  female with history of HTN, HLD who presented with worsening abdominal pain-patient was found to have sigmoid perforation with feculent peritonitis in the setting of stercoral colitis.  Patient underwent sigmoidectomy with end colostomy on 6/22-unfortunately postop course complicated by ileus and pelvic residual abscesses requiring IR drain placement.    Significant events: 6/21>> evaluated in the ED for abdominal pain-found to have left mid ureteral stone-9 mm with hydronephrosis-and large volume of stool-managed with supportive care and discharged home. 6/22>> worsening abdominal pain-repeat CT scan consistent with bowel perforation-surgery consult-admit to [facility]. 6/29>> CT abdomen with pelvic abscess. 6/30>> CT-guided drain and pelvic abscess by IR 7/04>> vomiting-recurrent ileus-NG tube reinserted 7/05>> TNA started-repeat CT abdomen increased size of abscess. 7/07>> IR placed RLQ drain and right transgluteal pelvic drain. 7/10>> NGT removed  Significant studies: 6/22>> CT renal stone study: Interval development of intraperitoneal free air-moderate volume free fluid-consistent with bowel perforation, persistent large volume stool with dilated sigmoid colon.  Similar location of left ureteral stone-9 mm. 6/29>> CT abdomen/pelvis: S/p partial colectomy-LLQ ostomy-complex free fluid with multiple foci of air-most pronounced in the surgical bed-possibility of superimposed infection or bowel leak cannot be excluded. 6/30>> echo: EF 60-65%. 7/05>> CT abdomen/pelvis: Previous collection with pigtail has increased in size, other less well-defined but enlarging peritoneal fluid collections.  Persistent-partially obstructing 11 mm calculus  in the distal left ureter.  Persistent fluid loops of dilated small bowel-likely ileus.  Significant microbiology data: 6/22>> urine culture: No growth 6/22>> blood culture: No growth 6/30>> pelvic abscess culture: Gram-negative rods/gram-positive cocci-mixed anaerobic flora. 7/07>> Pelvic abscess culture:moderate proteus and enterococcus  Procedures: 6/22>> sigmoidectomy with end colostomy.  By general surgeon Dr. Polly 6/30>> CT-guided drain and pelvic abscess by IR 08/19/2023 1.  Cystoscopy with left ureteral stent placement. 2. left retrograde pyelogram with interpretation. 3. Fluoroscopy <1 hour with intraoperative interpretation by urologist Dr. Arlyss Foot  Consults: General Surgery IR Urology  Subjective: Patient in bed feels better this morning no headache chest pain, no abdominal pain no bloating, no nausea.    Objective: Vitals: Blood pressure (!) 169/80, pulse 62, temperature 97.8 F (36.6 C), temperature source Oral, resp. rate 14, height 5' 4 (1.626 m), weight 59.1 kg, SpO2 98%.   Exam:  Awake Alert, No new F.N deficits, Normal affect [place].AT,PERRAL Supple Neck, No JVD,   Symmetrical Chest wall movement, Good air movement bilaterally, CTAB RRR,No Gallops, Rubs or new Murmurs,  Abdomen is more distended on 08/24/2023. 2 abdominal drains, colostomy bag with brown stool, right arm PICC line, NG tube in place.   Assessment/Plan:  Sepsis secondary to stercoral colitis with ischemia/perforation of sigmoid colon with feculent peritonitis-s/p S/p ex lap with Hartmann's procedure for stercoral colitis with ischemia and perforation with feculent peritonitis, Dr. Polly 07/30/23 - complicated by postoperative ileus and then residual pelvic abscess requiring CT-guided drain placement by on 08/07/23, SBO.  General surgery and IR following, has 2 abdominal drains placed by IR as well on 08/07/2023, right lower quadrant drain was removed 08/24/2023, she finished her antibiotic  course on 08/27/2023. She unfortunately has developed SBO again for which she  is getting conservative management, NG tube to intermittent suction with bowel rest, being managed by general surgery.  For now TNA continued via right arm PICC line. Defer management of postop abdominal issues to general surgery and IR.  Requested IR again on 08/29/2023 to review her abdominal drain and give us  recommendations on management.  Hydronephrosis secondary to distal left ureteral stone Urology following-with recommendations for supportive care for now-now that her medical problems have more or less stabilized underwent left ureteral stent placement by urology Dr. Arlyss Foot on 08/18/2023  Hospital delirium Secondary to acute illness/narcotics Now resolved  Normocytic anemia Due to acute illness,+/- coffee-ground emesis on 7/7 Hemoglobin stable although low, type screen done will continue to monitor transfuse if she drops consistently close to hemoglobin of 7.  Thrombocytosis Likely reactive Supportive care  Coffee-ground NG tube emesis Seems to have resolved-no longer coffee-ground emesis-bile appearing fluid in canister. Continue PPI Hb stable, staying around 7.5, from multiple blood draws and some perioperative blood loss along with him dilution.  She is s/p 2 units of packed RBC transfusion on 08/27/2023, posttransfusion H&H stable.  Hypomagnesemia/hypophosphatemia/hypokalemia Secondary to NG tube suctioning Resolved.  HTN BP stable-all oral antihypertensives on hold due to n.p.o. status Continue with as needed IV hydralazine  and metoprolol .   Lower extremity edema Secondary to hypoalbuminemia/IV fluid resuscitation in the setting of acute illness Echo stable-UA negative for proteinuria Will start diuretics once she is a bit more stable.  Chronic idiopathic constipation Bowel function recovering-brown stools Once more stable-will need to be placed on a bowel regimen.  Odynophagia  secondary to oral/aphthous ulcers Improved on Valtrex and Magic mouthwash Supportive care  Glaucoma Timolol  eyedrops  Insomnia Appears to be primarily due to pain-anxiety Schedule trazodone  at night If no response-use IV Benadryl   Debility/deconditioning PT OT eval-Home health recommended.  Code status:   Code Status: Full Code   DVT Prophylaxis: Place TED hose Start: 08/27/23 0539 enoxaparin  (LOVENOX ) injection 40 mg Start: 08/17/23 1000 Place TED hose Start: 08/09/23 1000 SCDs Start: 07/30/23 1953   Family Communication: Family member bedside on 08/22/2023, 08/23/2023, 08/24/2023, 08/25/2023, 08/26/2023, 08/27/2023, 08/28/2023, 08/29/2023   Disposition Plan: Status is: Inpatient Remains inpatient appropriate because: Severity of illness   Planned Discharge Destination:Home health   Diet: Diet Order             Diet full liquid Room service appropriate? Yes; Fluid consistency: Thin  Diet effective now                    Data Review:   Inpatient Medications  Scheduled Meds:  sodium chloride    Intravenous Once   acetaminophen   1,000 mg Oral Q6H   bisacodyl   10 mg Rectal Daily   carvedilol   6.25 mg Oral BID WC   Chlorhexidine  Gluconate Cloth  6 each Topical Daily   enoxaparin  (LOVENOX ) injection  40 mg Subcutaneous Q24H   feeding supplement  237 mL Oral BID BM   ferrous sulfate   325 mg Oral BID WC   folic acid   1 mg Oral Daily   latanoprost   1 drop Both Eyes QHS   methocarbamol   500 mg Oral TID   Or   methocarbamol  (ROBAXIN ) injection  500 mg Intravenous TID   pantoprazole  (PROTONIX ) IV  40 mg Intravenous Q12H   polyethylene glycol  17 g Oral BID   timolol   1 drop Both Eyes BID   traZODone   50 mg Oral QHS   Continuous Infusions:  TPN ADULT (ION) 70  mL/hr at 08/28/23 1720   PRN Meds:.albuterol , diphenhydrAMINE , hydrALAZINE , HYDROmorphone  (DILAUDID ) injection, melatonin, menthol -cetylpyridinium, ondansetron  **OR** ondansetron  (ZOFRAN ) IV, mouth rinse,  oxyCODONE , phenol, sodium chloride  flush  DVT Prophylaxis  Place TED hose Start: 08/27/23 0539 enoxaparin  (LOVENOX ) injection 40 mg Start: 08/17/23 1000 Place TED hose Start: 08/09/23 1000 SCDs Start: 07/30/23 1953   Recent Labs  Lab 08/25/23 0339 08/26/23 0430 08/27/23 0405 08/28/23 0450 08/29/23 0407  WBC 10.8* 10.8* 8.0 12.2* 8.2  HGB 7.4* 7.6* 7.0* 10.7* 10.4*  HCT 23.5* 23.8* 21.9* 32.1* 32.0*  PLT 463* 482* 417* 471* 422*  MCV 90.7 90.8 90.9 90.2 90.7  MCH 28.6 29.0 29.0 30.1 29.5  MCHC 31.5 31.9 32.0 33.3 32.5  RDW 17.6* 18.1* 18.3* 17.2* 17.7*  LYMPHSABS 1.8 1.6 2.0 1.3 1.8  MONOABS 0.6 0.5 0.4 0.6 0.6  EOSABS 0.2 0.2 0.3 0.3 0.3  BASOSABS 0.0 0.1 0.1 0.1 0.1    Recent Labs  Lab 08/24/23 0442 08/25/23 0339 08/26/23 0430 08/27/23 0405 08/28/23 0450 08/29/23 0407  NA 136 139 140 139 140 136  K 3.5 4.0 3.6 3.8 3.8 3.8  CL 105 107 107 105 105 105  CO2 25 25 27 27 26 25   ANIONGAP 6 7 6 7 9 6   GLUCOSE 106* 98 109* 100* 99 96  BUN 22 18 18 17 17 19   CREATININE 0.50 0.53 0.44 0.46 0.41* 0.41*  AST 14* 13* 14* 14* 14* 12*  ALT 15 13 11 12 12 11   ALKPHOS 75 73 67 65 67 65  BILITOT 0.2 0.2 0.2 0.2 0.5 0.4  ALBUMIN  1.6* 1.6* 1.6* 1.6* 1.9* 2.0*  CRP 1.6* 1.0* 1.0* 0.9  --   --   PROCALCITON <0.10 <0.10 <0.10 <0.10 <0.10  --   MG 1.7 1.9 1.8 1.9  --  1.7  PHOS 2.8 2.8 3.3 3.4  --  3.8  CALCIUM  7.7* 7.8* 7.9* 8.0* 8.2* 8.5*      Recent Labs  Lab 08/24/23 0442 08/25/23 0339 08/26/23 0430 08/27/23 0405 08/28/23 0450 08/29/23 0407  CRP 1.6* 1.0* 1.0* 0.9  --   --   PROCALCITON <0.10 <0.10 <0.10 <0.10 <0.10  --   MG 1.7 1.9 1.8 1.9  --  1.7  CALCIUM  7.7* 7.8* 7.9* 8.0* 8.2* 8.5*    --------------------------------------------------------------------------------------------------------------- Lab Results  Component Value Date   CHOL 183 11/30/2021   HDL 62.70 11/30/2021   LDLCALC 101 (H) 11/30/2021   LDLDIRECT 124.0 10/23/2020   TRIG 87 08/28/2023    CHOLHDL 3 11/30/2021    Lab Results  Component Value Date   HGBA1C 6.0 11/30/2021   No results for input(s): TSH, T4TOTAL, FREET4, T3FREE, THYROIDAB in the last 72 hours. No results for input(s): VITAMINB12, FOLATE, FERRITIN, TIBC, IRON, RETICCTPCT in the last 72 hours. ------------------------------------------------------------------------------------------------------------------ Cardiac Enzymes No results for input(s): CKMB, TROPONINI, MYOGLOBIN in the last 168 hours.  Invalid input(s): CK  Radiology Reports  DG Abd Portable 1V Result Date: 08/29/2023 CLINICAL DATA:  Small bowel obstruction. EXAM: PORTABLE ABDOMEN - 1 VIEW COMPARISON:  08/28/2023 FINDINGS: NG tube tip is positioned in the proximal stomach. Side port of the NG tube is in the region of the GE junction in tube could be advanced 3-4 cm to ensure side port placement below the GE junction as clinically warranted. Left internal ureteral stent device again noted. Percutaneous pigtail drain overlies the right abdomen and pelvis with formed loop overlying the central upper pelvis. Diffuse gaseous bowel distension noted with some small bowel loops measuring  up to 3.6 cm diameter. IMPRESSION: NG tube tip is positioned in the proximal stomach. Side port of the NG tube is in the region of the GE junction and tube could be advanced 3-4 cm to ensure side port placement below the GE junction as clinically Diffuse gaseous bowel distension. Electronically Signed   By: Camellia Candle M.D.   On: 08/29/2023 07:02   DG Abd Portable 1V Result Date: 08/28/2023 CLINICAL DATA:  73 year old female status post exploratory laparotomy. Abdominal pain. Small-bowel obstruction. EXAM: PORTABLE ABDOMEN - 1 VIEW COMPARISON:  Abdomen radiographs yesterday and earlier. FINDINGS: Portable AP supine view at 0724 hours. Stable enteric tube, left double-J ureteral stent, right percutaneous pelvic drain. Non obstructed bowel gas  pattern. Stable visualized osseous structures. Negative lung bases. IMPRESSION: 1. Non obstructed bowel gas pattern. 2.  Stable lines and tubes. Electronically Signed   By: VEAR Hurst M.D.   On: 08/28/2023 07:44   Signature  -   Lavada Stank M.D on 08/29/2023 at 10:05 AM   -  To page go to [email or web address]

## 2023-08-29 NOTE — Plan of Care (Signed)
 Pt has rested quietly throughout the night with no distress noted. Alert and oriented. On room air. SR  on the monitor. Purewick intact to suction. NG clamped until after night meds then at 0030 placed to low int suction with bile drng. Pt medicated 3 times with relief  noted. Family member at bedside. No other complaints voiced.     Problem: Education: Goal: Knowledge of General Education information will improve Description: Including pain rating scale, medication(s)/side effects and non-pharmacologic comfort measures Outcome: Progressing   Problem: Health Behavior/Discharge Planning: Goal: Ability to manage health-related needs will improve Outcome: Progressing   Problem: Clinical Measurements: Goal: Respiratory complications will improve Outcome: Progressing Goal: Cardiovascular complication will be avoided Outcome: Progressing   Problem: Pain Managment: Goal: General experience of comfort will improve and/or be controlled Outcome: Progressing

## 2023-08-30 ENCOUNTER — Inpatient Hospital Stay (HOSPITAL_COMMUNITY)

## 2023-08-30 DIAGNOSIS — K631 Perforation of intestine (nontraumatic): Secondary | ICD-10-CM | POA: Diagnosis not present

## 2023-08-30 HISTORY — PX: IR CATHETER TUBE CHANGE: IMG717

## 2023-08-30 LAB — COMPREHENSIVE METABOLIC PANEL WITH GFR
ALT: 11 U/L (ref 0–44)
AST: 12 U/L — ABNORMAL LOW (ref 15–41)
Albumin: 2 g/dL — ABNORMAL LOW (ref 3.5–5.0)
Alkaline Phosphatase: 66 U/L (ref 38–126)
Anion gap: 7 (ref 5–15)
BUN: 20 mg/dL (ref 8–23)
CO2: 27 mmol/L (ref 22–32)
Calcium: 8.5 mg/dL — ABNORMAL LOW (ref 8.9–10.3)
Chloride: 104 mmol/L (ref 98–111)
Creatinine, Ser: 0.49 mg/dL (ref 0.44–1.00)
GFR, Estimated: >60 mL/min (ref >60)
Glucose, Bld: 108 mg/dL — ABNORMAL HIGH (ref 70–99)
Potassium: 4.1 mmol/L (ref 3.5–5.1)
Sodium: 138 mmol/L (ref 135–145)
Total Bilirubin: 0.4 mg/dL (ref 0.0–1.2)
Total Protein: 5.3 g/dL — ABNORMAL LOW (ref 6.5–8.1)

## 2023-08-30 LAB — GLUCOSE, CAPILLARY
Glucose-Capillary: 103 mg/dL — ABNORMAL HIGH (ref 70–99)
Glucose-Capillary: 108 mg/dL — ABNORMAL HIGH (ref 70–99)
Glucose-Capillary: 115 mg/dL — ABNORMAL HIGH (ref 70–99)

## 2023-08-30 LAB — CBC WITH DIFFERENTIAL/PLATELET
Abs Immature Granulocytes: 0.06 K/uL (ref 0.00–0.07)
Basophils Absolute: 0 K/uL (ref 0.0–0.1)
Basophils Relative: 1 %
Eosinophils Absolute: 0.2 K/uL (ref 0.0–0.5)
Eosinophils Relative: 3 %
HCT: 31.3 % — ABNORMAL LOW (ref 36.0–46.0)
Hemoglobin: 10.1 g/dL — ABNORMAL LOW (ref 12.0–15.0)
Immature Granulocytes: 1 %
Lymphocytes Relative: 22 %
Lymphs Abs: 1.7 K/uL (ref 0.7–4.0)
MCH: 29.5 pg (ref 26.0–34.0)
MCHC: 32.3 g/dL (ref 30.0–36.0)
MCV: 91.5 fL (ref 80.0–100.0)
Monocytes Absolute: 0.6 K/uL (ref 0.1–1.0)
Monocytes Relative: 8 %
Neutro Abs: 5.1 K/uL (ref 1.7–7.7)
Neutrophils Relative %: 65 %
Platelets: 359 K/uL (ref 150–400)
RBC: 3.42 MIL/uL — ABNORMAL LOW (ref 3.87–5.11)
RDW: 18 % — ABNORMAL HIGH (ref 11.5–15.5)
WBC: 7.7 K/uL (ref 4.0–10.5)
nRBC: 0 % (ref 0.0–0.2)

## 2023-08-30 LAB — MAGNESIUM: Magnesium: 1.7 mg/dL (ref 1.7–2.4)

## 2023-08-30 LAB — PHOSPHORUS: Phosphorus: 4.2 mg/dL (ref 2.5–4.6)

## 2023-08-30 MED ORDER — TRAVASOL 10 % IV SOLN
INTRAVENOUS | Status: AC
  Filled 2023-08-30: qty 890.4

## 2023-08-30 MED ORDER — FENTANYL CITRATE (PF) 100 MCG/2ML IJ SOLN
INTRAMUSCULAR | Status: AC | PRN
  Administered 2023-08-30: 50 ug via INTRAVENOUS

## 2023-08-30 MED ORDER — MIDAZOLAM HCL 2 MG/2ML IJ SOLN
INTRAMUSCULAR | Status: AC
  Filled 2023-08-30: qty 2

## 2023-08-30 MED ORDER — FENTANYL CITRATE (PF) 100 MCG/2ML IJ SOLN
INTRAMUSCULAR | Status: AC
  Filled 2023-08-30: qty 2

## 2023-08-30 MED ORDER — MIDAZOLAM HCL 2 MG/2ML IJ SOLN
INTRAMUSCULAR | Status: AC | PRN
Start: 2023-08-30 — End: 2023-08-30
  Administered 2023-08-30: 1 mg via INTRAVENOUS

## 2023-08-30 MED ORDER — IOHEXOL 300 MG/ML  SOLN
50.0000 mL | Freq: Once | INTRAMUSCULAR | Status: AC | PRN
  Administered 2023-08-30: 15 mL

## 2023-08-30 MED ORDER — LIDOCAINE-EPINEPHRINE 1 %-1:100000 IJ SOLN
INTRAMUSCULAR | Status: AC
  Filled 2023-08-30: qty 1

## 2023-08-30 MED ORDER — LIDOCAINE HCL 1 % IJ SOLN
20.0000 mL | Freq: Once | INTRAMUSCULAR | Status: AC
  Administered 2023-08-30: 5 mL

## 2023-08-30 MED ORDER — MAGNESIUM SULFATE 4 GM/100ML IV SOLN
4.0000 g | Freq: Once | INTRAVENOUS | Status: AC
  Administered 2023-08-30: 4 g via INTRAVENOUS
  Filled 2023-08-30: qty 100

## 2023-08-30 NOTE — Progress Notes (Signed)
 12 Days Post-Op  Subjective: CC: Patient denies nausea/vomiting, has a decent amount of stool in bad with some gas. NGT out. She denies abdominal pain, however, she is complaining of tenderness around transgluteal drain. She is NPO pending IR drain repositioning.    Objective: Vital signs in last 24 hours: Temp:  [97.7 F (36.5 C)-98 F (36.7 C)] 98 F (36.7 C) (07/23 0607) Pulse Rate:  [69-71] 69 (07/22 2151) Resp:  [17-23] 17 (07/23 0607) BP: (154-161)/(79-86) 161/86 (07/23 0607) SpO2:  [97 %-98 %] 98 % (07/22 2151) Last BM Date : 08/28/23  Intake/Output from previous day: 07/22 0701 - 07/23 0700 In: -  Out: 1225 [Urine:900; Emesis/NG output:300; Drains:25] Intake/Output this shift: No intake/output data recorded.  PE: General appearance: alert and cooperative GI: Soft, LLQ ttp, no rigidity or guarding. Ostomy viable. Midline wound remains overall clean with mostly granulation tissue and some fibrinous tissue at the base, 1.5cm are of tracking at the most inferior aspect, no obvious separation of fascia, no drainage.              NGT - clamped             JP RLQ - purulent    Lab Results:  Recent Labs    08/29/23 0407 08/30/23 0320  WBC 8.2 7.7  HGB 10.4* 10.1*  HCT 32.0* 31.3*  PLT 422* 359   BMET Recent Labs    08/29/23 0407 08/30/23 0320  NA 136 138  K 3.8 4.1  CL 105 104  CO2 25 27  GLUCOSE 96 108*  BUN 19 20  CREATININE 0.41* 0.49  CALCIUM  8.5* 8.5*   PT/INR No results for input(s): LABPROT, INR in the last 72 hours. CMP     Component Value Date/Time   NA 138 08/30/2023 0320   NA 140 11/10/2007 0000   K 4.1 08/30/2023 0320   CL 104 08/30/2023 0320   CO2 27 08/30/2023 0320   GLUCOSE 108 (H) 08/30/2023 0320   BUN 20 08/30/2023 0320   BUN 10 11/10/2007 0000   CREATININE 0.49 08/30/2023 0320   CREATININE 0.74 06/26/2020 1522   CALCIUM  8.5 (L) 08/30/2023 0320   PROT 5.3 (L) 08/30/2023 0320   ALBUMIN  2.0 (L) 08/30/2023 0320   AST 12  (L) 08/30/2023 0320   ALT 11 08/30/2023 0320   ALKPHOS 66 08/30/2023 0320   BILITOT 0.4 08/30/2023 0320   GFRNONAA >60 08/30/2023 0320   Lipase     Component Value Date/Time   LIPASE <10 (L) 07/30/2023 0936    Studies/Results: CT ABDOMEN PELVIS W CONTRAST Result Date: 08/29/2023 CLINICAL DATA:  Follow-up drainage. EXAM: CT ABDOMEN AND PELVIS WITH CONTRAST TECHNIQUE: Multidetector CT imaging of the abdomen and pelvis was performed using the standard protocol following bolus administration of intravenous contrast. RADIATION DOSE REDUCTION: This exam was performed according to the departmental dose-optimization program which includes automated exposure control, adjustment of the mA and/or kV according to patient size and/or use of iterative reconstruction technique. CONTRAST:  75mL OMNIPAQUE  IOHEXOL  350 MG/ML SOLN COMPARISON:  CT abdomen pelvis dated 08/23/2023. FINDINGS: Lower chest: Bibasal subpleural atelectasis. There is coronary vascular calcification. No intra-abdominal free air.  Trace subhepatic free fluid. Hepatobiliary: Small cyst in the left lobe of the liver. No biliary dilatation. The gallbladder is unremarkable. Pancreas: Unremarkable. No pancreatic ductal dilatation or surrounding inflammatory changes. Spleen: Normal in size without focal abnormality. Adrenals/Urinary Tract: The adrenal glands are unremarkable. Mild left hydronephrosis, progressed since the prior CT. Left  ureteral stent with proximal tip in the interpolar collecting system and distal end in the urinary bladder. There is a 1 cm stone in the distal left ureter along the catheter is similar location as the prior CT. No hydronephrosis on the right. Small bilateral renal cysts or dilated calyx. The urinary bladder is grossly unremarkable. Stomach/Bowel: Postsurgical changes of the bowel with a left anterior colostomy. Mild dilatation of several small bowel loops measure up to approximately 3 cm. A gradual transition in the  anterior right pelvis noted. Findings favor to represent an ileus. A small-bowel obstruction secondary to peritoneal adhesions is considered less likely. Small-bowel series may provide better evaluation if clinically indicated. The appendix is unremarkable as visualized. Vascular/Lymphatic: Advanced aortoiliac atherosclerotic disease. The IVC is unremarkable. No portal venous gas. There is no adenopathy. Reproductive: The uterus is grossly unremarkable. No suspicious adnexal masses. Other: Right transgluteal pigtail drainage catheter with tip in the posterior pelvis. There is a 5.3 x 2.9 cm loculated collection just superior to the pigtail tip of the catheter in the posterior pelvis, relatively similar to prior CT. There has been interval removal of the right anterior percutaneous drainage catheter. Musculoskeletal: Degenerative changes of the spine. No acute osseous pathology. Midline vertical anterior pelvic wall open wound. IMPRESSION: 1. Right transgluteal pigtail drainage catheter with tip in the posterior pelvis. A 5.3 x 2.9 cm loculated collection just superior to the pigtail tip of the catheter in the posterior pelvis, relatively similar to prior CT. 2. Mild left hydronephrosis, progressed since the prior CT. Left ureteral stent in place. A 1 cm stone in the distal left ureter along the catheter is similar location as the prior CT. 3. Postsurgical changes of the bowel with a left anterior colostomy. Mild dilatation of several small bowel loops favor to represent an ileus. A small-bowel obstruction secondary to peritoneal adhesions is considered less likely. Small-bowel series may provide better evaluation if clinically indicated. 4.  Aortic Atherosclerosis (ICD10-I70.0). Electronically Signed   By: Vanetta Chou M.D.   On: 08/29/2023 14:33   DG Abd Portable 1V Result Date: 08/29/2023 CLINICAL DATA:  Small bowel obstruction. EXAM: PORTABLE ABDOMEN - 1 VIEW COMPARISON:  08/28/2023 FINDINGS: NG tube tip  is positioned in the proximal stomach. Side port of the NG tube is in the region of the GE junction in tube could be advanced 3-4 cm to ensure side port placement below the GE junction as clinically warranted. Left internal ureteral stent device again noted. Percutaneous pigtail drain overlies the right abdomen and pelvis with formed loop overlying the central upper pelvis. Diffuse gaseous bowel distension noted with some small bowel loops measuring up to 3.6 cm diameter. IMPRESSION: NG tube tip is positioned in the proximal stomach. Side port of the NG tube is in the region of the GE junction and tube could be advanced 3-4 cm to ensure side port placement below the GE junction as clinically Diffuse gaseous bowel distension. Electronically Signed   By: Camellia Candle M.D.   On: 08/29/2023 07:02    Anti-infectives: Anti-infectives (From admission, onward)    Start     Dose/Rate Route Frequency Ordered Stop   08/18/23 1800  Ampicillin -Sulbactam (UNASYN ) 3 g in sodium chloride  0.9 % 100 mL IVPB  Status:  Discontinued        3 g 200 mL/hr over 30 Minutes Intravenous Every 6 hours 08/18/23 0922 08/27/23 1241   08/06/23 1130  acyclovir  (ZOVIRAX ) 200 MG capsule 400 mg  Status:  Discontinued  400 mg Oral 3 times daily 08/06/23 1037 08/08/23 1424   08/06/23 1100  piperacillin -tazobactam (ZOSYN ) IVPB 3.375 g        3.375 g 12.5 mL/hr over 240 Minutes Intravenous Every 8 hours 08/06/23 1051 08/18/23 1259   07/30/23 1800  piperacillin -tazobactam (ZOSYN ) IVPB 3.375 g        3.375 g 12.5 mL/hr over 240 Minutes Intravenous Every 8 hours 07/30/23 1332 08/04/23 2359   07/30/23 1400  piperacillin -tazobactam (ZOSYN ) IVPB 3.375 g  Status:  Discontinued        3.375 g 100 mL/hr over 30 Minutes Intravenous Every 8 hours 07/30/23 1329 07/30/23 1332   07/30/23 1015  piperacillin -tazobactam (ZOSYN ) IVPB 3.375 g        3.375 g 100 mL/hr over 30 Minutes Intravenous  Once 07/30/23 1013 07/30/23 1311         Assessment/Plan S/p ex lap with Hartmann's procedure for stercoral colitis with ischemia and perforation with feculent peritonitis, Dr. Polly 6/22   - CT scan (7/5) showed collection remains sizable and has slightly enlarged compared with the previous study. This abuts the rectal suture line and a leak in this area cannot be excluded. IR repositioned drains, both purulent. Cx with proteus mirabilis and enterococcus.  - CT scan 7/17 shows dilated stomach and SBO, some improvement in collections  - 7/17 RLQ drain removed, R transgluteal drain left in place and purulent - Repeat NGT clamping trial and CLD today. Possible NGT removal later today.  - Continue BID dressing changes to midline - Dakins added 7/19 for 3d for exam finding of pseudomonas colonization. Now appears to be clearing up.  - WOCN following for new ostomy.  - Mobilize, PT - recommending HH - Pulm toilet    FEN: NGT out, NPO pending IR drain repositioning.May continue FLD, TPN after procedure.  VTE: Lovenox  ID: Abx stopped 7/20. Afebrile. WBC on 7/22 down to 7.7 from 8.2.    LOS: 31 days    Eulah Hammonds, [facility] Surgery 08/30/2023, 8:47 AM Please see Amion for pager number during day hours 7:00am-4:30pm

## 2023-08-30 NOTE — Progress Notes (Signed)
 Patient to IR at this time.

## 2023-08-30 NOTE — Progress Notes (Signed)
 PHARMACY - TOTAL PARENTERAL NUTRITION CONSULT NOTE  Indication: intolerance to feeding, recurrent ileus  Patient Measurements: Height: 5' 4 (162.6 cm) Weight: 59.1 kg (130 lb 4.7 oz) IBW/kg (Calculated) : 54.7 TPN AdjBW (KG): 61.2 Body mass index is 22.36 kg/m. Usual Weight: ~59 kg  Assessment:  72 YOF s/p exp lap with Hartmann's procedure for stercoral colitis with ischemia and perforation with feculent peritonitis on 6/22. Noted pt has been NPO/clear liquid/full liquid diet since after surgery with limited intake. Pt now with ileus on Xray (dilated loops of small bowel) and N/V with liquid diet so NGT replaced 7/4, now NPO and starting TPN. Pt at risk of refeeding with prolonged period of little PO intake since admission.  Glucose / Insulin : no hx DM - CBGs < 180, off insulin  Electrolytes: K 4.1 (goal >/= 4 for ileus), Mag 1.7 (goal >/= 2 for ileus), others WNL Renal: SCr < 1, BUN wnl Hepatic: LFTs / tbili WNL, albumin  2.0, TG down at 87 Intake / Output; MIVF: UOP not documented, drain 300 mL, ostomy 25 mL  GI Imaging: 7/5 abd xray - persistent dilated loops of small bowel 7/5 CT - pelvic fluid collection slightly enlarged, suspicious for peritonitis, partially obstructing calculus in L ureter, ileus 7/16 CT small bowel obstruction 7/17 abd XR - SBO 7/19 abd XR - enteric tube in stomach, improving bowel gas pattern, oral contrast in R colon  7/20 Abd xr -  Large bowel oral contrast has continued distally. Mildly dilated left abdominal small bowel loops, but gas pattern still improved GI Surgeries / Procedures:  6/22 ex-lap with Hartmann's procedure for stercoral colitis with ischemia and perf with feculent peritonitis 7/8 CT-guided RLQ abd abscess and transgluteal pelvic drains [>464mL tan colored fluid] 7/10 NGT removed  7/11 NGT replaced  7/16 ureteral stent placement   Central access: PICC placed 08/12/23 TPN start date: 08/12/23  Nutritional Goals: Goal TPN 70 ml/hr will  provide 89g AA, 1660 kCal   RD Estimated Needs Total Energy Estimated Needs: 1650-1850 kcals Total Protein Estimated Needs: 80-90 g Total Fluid Estimated Needs: >/= 1.7 L  Current Nutrition:  TPN 7/05 >> - NGT clamp trials started 7/19  CLD 7/20 > 7/22 FLD 7/22  NPO 7/23 for drain repositioning  Plan:   X1 IV Mg 4g outside of TPN Continue TPN rate at 70 ml/hr to provide 100% of need Electrolytes in TPN: Na 140 mEq/L, K 80 mEq/L, Ca 2 mEq/L, Mg to 15 mEq/L (max), Phos 9 mmol/L, Cl:Ac 1:1  Add multivitamin trace elements to TPN  Monitor TPN labs on Mon/Thurs - labs in AM  Thank you for allowing pharmacy to be a part of this patient's care.  Shelba Collier, PharmD, BCPS Clinical Pharmacist

## 2023-08-30 NOTE — Progress Notes (Signed)
 PT Cancellation Note  Patient Details Name: Angela [name] MRN: [id number] DOB: 1951-01-11   Cancelled Treatment:    Reason Eval/Treat Not Completed: Patient declined, no reason specified (Pt declined stating she is about to get a drain repositioned. Politely requests PT to return this afternoon.)   Smt Lokey 08/30/2023, 9:46 AM

## 2023-08-30 NOTE — Plan of Care (Signed)

## 2023-08-30 NOTE — Progress Notes (Signed)
 Physical Therapy Treatment Patient Details Name: Angela [name] MRN: [id number] DOB: Feb 11, 1950 Today's Date: 08/30/2023   History of Present Illness 73 yo female presents to ED on 6/21 with abdominal pain. Workup for sepsis secondary to bowel perforation. S/p sigmoidectomy with end colostomy 6/22. 6/29 increased WBC and abd pain, s/p CT guided drain placement for pelvic abscess 6/30, repeat drain placement RLQ and R transgluteal pelvic drain 7/7. Pt also with hydroureteronephrosis secondary to L ureteral stone, s/p L ureteral stent placement and L retrograde pyelogram 7/11. PMH includes  HTN, HLD, glaucoma and prior tobacco abuse.    PT Comments  Pt tolerated treatment well today. Pt with similar presentation to previous session. No change in DC/DME recs at this time. PT will continue to follow.     If plan is discharge home, recommend the following: A little help with walking and/or transfers;A little help with bathing/dressing/bathroom;Assistance with cooking/housework;Direct supervision/assist for medications management;Assist for transportation;Help with stairs or ramp for entrance   Can travel by private vehicle        Equipment Recommendations  Rolling walker (2 wheels)    Recommendations for Other Services       Precautions / Restrictions Precautions Precautions: Fall;Other (comment) Recall of Precautions/Restrictions: Intact Precaution/Restrictions Comments: JP drain and ostomy bag Restrictions Weight Bearing Restrictions Per Provider Order: No     Mobility  Bed Mobility Overal bed mobility: Modified Independent Bed Mobility: Supine to Sit     Supine to sit: Modified independent (Device/Increase time)     General bed mobility comments: Able to sit EOB on her own. Family assisted pt back to bed.    Transfers Overall transfer level: Needs assistance Equipment used: None Transfers: Sit to/from Stand Sit to Stand: Supervision           General transfer  comment: no physical assist needed    Ambulation/Gait Ambulation/Gait assistance: Supervision Gait Distance (Feet): 600 Feet Assistive device: None, IV Pole Gait Pattern/deviations: Decreased stride length, Step-through pattern, Trunk flexed Gait velocity: decreased     General Gait Details: slow, guarded gait with UE support on IV pole initially progressing to no UE support. no LOB   Stairs             Wheelchair Mobility     Tilt Bed    Modified Rankin (Stroke Patients Only)       Balance Overall balance assessment: Needs assistance Sitting-balance support: Feet supported Sitting balance-Leahy Scale: Good     Standing balance support: During functional activity, No upper extremity supported, Single extremity supported Standing balance-Leahy Scale: Fair Standing balance comment: with IV pole and no AD                            Communication Communication Communication: No apparent difficulties  Cognition Arousal: Alert Behavior During Therapy: WFL for tasks assessed/performed   PT - Cognitive impairments: No apparent impairments                         Following commands: Intact      Cueing Cueing Techniques: Verbal cues  Exercises      General Comments        Pertinent Vitals/Pain Pain Assessment Pain Assessment: No/denies pain Faces Pain Scale: Hurts a little bit Pain Location: abdomen, JP drain site Pain Descriptors / Indicators: Discomfort, Guarding, Grimacing    Home Living  Prior Function            PT Goals (current goals can now be found in the care plan section) Acute Rehab PT Goals Patient Stated Goal: to get better PT Goal Formulation: With patient Time For Goal Achievement: 09/04/23 Progress towards PT goals: Progressing toward goals    Frequency    Min 3X/week      PT Plan      Co-evaluation              AM-PAC PT 6 Clicks Mobility   Outcome  Measure  Help needed turning from your back to your side while in a flat bed without using bedrails?: None Help needed moving from lying on your back to sitting on the side of a flat bed without using bedrails?: A Little Help needed moving to and from a bed to a chair (including a wheelchair)?: A Little Help needed standing up from a chair using your arms (e.g., wheelchair or bedside chair)?: A Little Help needed to walk in hospital room?: A Little Help needed climbing 3-5 steps with a railing? : A Little 6 Click Score: 19    End of Session   Activity Tolerance: Patient tolerated treatment well Patient left: with call bell/phone within reach;with family/visitor present;in bed;with nursing/sitter in room Nurse Communication: Mobility status PT Visit Diagnosis: Other abnormalities of gait and mobility (R26.89);Pain     Time: 8571-8552 PT Time Calculation (min) (ACUTE ONLY): 19 min  Charges:    $Gait Training: 8-22 mins PT General Charges $$ ACUTE PT VISIT: 1 Visit                     Sueellen NOVAK, PT, DPT Acute Rehab Services [phone]    Latice Waitman 08/30/2023, 4:27 PM

## 2023-08-30 NOTE — Progress Notes (Signed)
 Patient returns to 5W28 at this time

## 2023-08-30 NOTE — TOC Progression Note (Addendum)
 Transition of Care [facility]) - Progression Note    Patient Details  Name: Angela [name] MRN: [id number] Date of Birth: 10-Dec-1950  Transition of Care [facility]) CM/SW Contact  Marval Gell, RN Phone Number: 08/30/2023, 10:11 AM  Clinical Narrative:     Beatris w patient and family at bedside.  Discussed needs for DC (still currently weaning TPN) DME- needs RW BSC and hospital bed. Orders are placed, and DME ordered to be delivered to the home, spoke w Mitch. Patient insurance requires DME to be ordered through [employer]. Address verified in chart [facility]- Order placed for [facility] RN PT OT. [facility]da has accepted.  Will need wound care instructions added to [facility] order closer to DC.  Anticipate family will transport home                      Expected Discharge Plan and Services                                               Social Drivers of Health (SDOH) Interventions SDOH Screenings   Food Insecurity: Patient Declined (08/02/2023)  Housing: Unknown (08/02/2023)  Transportation Needs: Patient Declined (08/02/2023)  Utilities: Patient Declined (08/02/2023)  Alcohol Screen: Low Risk  (04/19/2023)  Depression (PHQ2-9): Low Risk  (06/23/2023)  Financial Resource Strain: Patient Declined (04/19/2023)  Physical Activity: Sufficiently Active (04/19/2023)  Social Connections: Patient Declined (08/02/2023)  Stress: No Stress Concern Present (04/19/2023)  Tobacco Use: Medium Risk (07/30/2023)  Health Literacy: Adequate Health Literacy (04/19/2023)    Readmission Risk Interventions     No data to display

## 2023-08-30 NOTE — Progress Notes (Signed)
 PROGRESS NOTE    RUTHENE METHVIN  FMW:[id number] DOB: 12/19/1950 DOA: 07/30/2023 PCP: Catherine Charlies LABOR, DO  Chief Complaint  Patient presents with   Abdominal Pain   Hematuria   Shortness of Breath    Brief Narrative:   Patient is Angela [name] 73 y.o.  female with history of HTN, HLD who presented with worsening abdominal pain-patient was found to have sigmoid perforation with feculent peritonitis in the setting of stercoral colitis.  Patient underwent sigmoidectomy with end colostomy on 6/22-unfortunately postop course complicated by ileus and pelvic residual abscesses requiring IR drain placement.     Significant events: 6/21>> evaluated in the ED for abdominal pain-found to have left mid ureteral stone-9 mm with hydronephrosis-and large volume of stool-managed with supportive care and discharged home. 6/22>> worsening abdominal pain-repeat CT scan consistent with bowel perforation-surgery consult-admit to [facility]. 6/29>> CT abdomen with pelvic abscess. 6/30>> CT-guided drain and pelvic abscess by IR 7/04>> vomiting-recurrent ileus-NG tube reinserted 7/05>> TNA started-repeat CT abdomen increased size of abscess. 7/07>> IR placed RLQ drain and right transgluteal pelvic drain. 7/10>> NGT removed   Significant studies: 6/22>> CT renal stone study: Interval development of intraperitoneal free air-moderate volume free fluid-consistent with bowel perforation, persistent large volume stool with dilated sigmoid colon.  Similar location of left ureteral stone-9 mm. 6/29>> CT abdomen/pelvis: S/p partial colectomy-LLQ ostomy-complex free fluid with multiple foci of air-most pronounced in the surgical bed-possibility of superimposed infection or bowel leak cannot be excluded. 6/30>> echo: EF 60-65%. 7/05>> CT abdomen/pelvis: Previous collection with pigtail has increased in size, other less well-defined but enlarging peritoneal fluid collections.  Persistent-partially obstructing 11 mm calculus in the distal  left ureter.  Persistent fluid loops of dilated small bowel-likely ileus.   Significant microbiology data: 6/22>> urine culture: No growth 6/22>> blood culture: No growth 6/30>> pelvic abscess culture: Gram-negative rods/gram-positive cocci-mixed anaerobic flora. 7/07>> Pelvic abscess culture:moderate proteus and enterococcus   Procedures: 6/22>> sigmoidectomy with end colostomy.  By general surgeon Dr. Polly 6/30>> CT-guided drain and pelvic abscess by IR 08/19/2023 1.  Cystoscopy with left ureteral stent placement. 2. left retrograde pyelogram with interpretation. 3. Fluoroscopy <1 hour with intraoperative interpretation by urologist Dr. Arlyss Foot   Consults: General Surgery IR Urology  Assessment & Plan:   Principal Problem:   Bowel perforation (HCC) Active Problems:   Hydroureteronephrosis   Chronic idiopathic constipation   Metabolic acidosis with increased anion gap and accumulation of organic acids   Hyponatremia   Essential hypertension   Glaucoma   Perforated sigmoid colon (HCC)   Protein-calorie malnutrition, severe  Sepsis secondary to stercoral colitis with ischemia/perforation of sigmoid colon with feculent peritonitis S/p ex lap sigmoidectomy with end colostomy 6/22  complicated by postoperative ileus and then residual pelvic abscess requiring CT-guided drain placement by IR General surgery and IR following S/p repositioning of slightly displaced percutaneous drainage catheter within the center of the residual fluid collection on 7/23 Surgery recommending CLD and ADAT - dressing changes per surgery.  Dakins 7/19 x3 days for pseudomonas.   Currently on TPN, will follow how she does with PO  Abscess RLQ growing proteus mirabilis, enterococcus faecalis, bacteroides thetaiotaomicron, beta lactamase positive Abx have been completed as of 7/20, appreciate surgery recs   Hydronephrosis secondary to distal left ureteral stone Urology following-with  recommendations for supportive care for now-now that her medical problems have more or less stabilized underwent left ureteral stent placement by urology Dr. Arlyss Foot on 08/18/2023 Need to clarify follow up recommendations for post  hospitalization    Hospital delirium Secondary to acute illness/narcotics Now resolved   Normocytic anemia Due to acute illness,+/- coffee-ground emesis on 7/7 Hemoglobin stable although low, type screen done will continue to monitor transfuse if she drops consistently close to hemoglobin of 7.   Thrombocytosis Likely reactive Supportive care   Coffee-ground NG tube emesis Seems to have resolved Continue PPI S/p 2 units on 7/20  Hb stable  Hypomagnesemia/hypophosphatemia/hypokalemia Secondary to NG tube suctioning Resolved.   HTN Holding home BP meds for now Will monitor    Lower extremity edema Secondary to hypoalbuminemia/IV fluid resuscitation in the setting of acute illness Echo with preserved EF-UA negative for proteinuria Consider diuretics when more stable   Chronic idiopathic constipation Bowel function recovering-brown stools Once more stable-will need to be placed on Zaheer Wageman bowel regimen.   Odynophagia secondary to oral/aphthous ulcers Improved on Valtrex and Magic mouthwash Supportive care   Glaucoma Timolol  eyedrops   Insomnia Appears to be primarily due to pain-anxiety Schedule trazodone  at night If no response-use IV Benadryl    Debility/deconditioning PT OT eval-Home health recommended.     DVT prophylaxis: lovenox  Code Status: full Family Communication: none Disposition:   Status is: Inpatient Remains inpatient appropriate because: need for continued inpatient care   Consultants:  IR surgery  Procedures:   6/22  Sigmoidectomy with end colostomy   6/30 CT guided drain placement   7/7 CT guided placement of RLQ drain and right transgluteal pelvic drain   7/11 1.  Cystoscopy with left ureteral stent  placement 2. left retrograde pyelogram with interpretation 3. Fluoroscopy <1 hour with intraoperative interpretation    Antimicrobials:  Anti-infectives (From admission, onward)    Start     Dose/Rate Route Frequency Ordered Stop   08/18/23 1800  Ampicillin -Sulbactam (UNASYN ) 3 g in sodium chloride  0.9 % 100 mL IVPB  Status:  Discontinued        3 g 200 mL/hr over 30 Minutes Intravenous Every 6 hours 08/18/23 0922 08/27/23 1241   08/06/23 1130  acyclovir  (ZOVIRAX ) 200 MG capsule 400 mg  Status:  Discontinued        400 mg Oral 3 times daily 08/06/23 1037 08/08/23 1424   08/06/23 1100  piperacillin -tazobactam (ZOSYN ) IVPB 3.375 g        3.375 g 12.5 mL/hr over 240 Minutes Intravenous Every 8 hours 08/06/23 1051 08/18/23 1259   07/30/23 1800  piperacillin -tazobactam (ZOSYN ) IVPB 3.375 g        3.375 g 12.5 mL/hr over 240 Minutes Intravenous Every 8 hours 07/30/23 1332 08/04/23 2359   07/30/23 1400  piperacillin -tazobactam (ZOSYN ) IVPB 3.375 g  Status:  Discontinued        3.375 g 100 mL/hr over 30 Minutes Intravenous Every 8 hours 07/30/23 1329 07/30/23 1332   07/30/23 1015  piperacillin -tazobactam (ZOSYN ) IVPB 3.375 g        3.375 g 100 mL/hr over 30 Minutes Intravenous  Once 07/30/23 1013 07/30/23 1311       Subjective: Feels better after repositioning of catheter drain  Objective: Vitals:   08/30/23 1115 08/30/23 1120 08/30/23 1125 08/30/23 1234  BP: (!) 160/77 (!) 153/73 (!) 144/75 (!) 152/73  Pulse: 72 73 72 67  Resp: 18 16 16 20   Temp:      TempSrc:      SpO2: 97% 98% 95% 100%  Weight:      Height:        Intake/Output Summary (Last 24 hours) at 08/30/2023 1446 Last data filed at  08/30/2023 1100 Gross per 24 hour  Intake 0 ml  Output 925 ml  Net -925 ml   Filed Weights   08/24/23 0500 08/26/23 0500 08/27/23 0500  Weight: 58.2 kg 58.1 kg 59.1 kg    Examination:  General exam: Appears calm and comfortable  Respiratory system: unlabored Cardiovascular  system: RRR Gastrointestinal system: dressing in place, ostomy Drain with serosanguinous output  Central nervous system: Alert and oriented. No focal neurological deficits. Extremities: no LEE   Data Reviewed: I have personally reviewed following labs and imaging studies  CBC: Recent Labs  Lab 08/26/23 0430 08/27/23 0405 08/28/23 0450 08/29/23 0407 08/30/23 0320  WBC 10.8* 8.0 12.2* 8.2 7.7  NEUTROABS 8.3* 5.2 9.9* 5.5 5.1  HGB 7.6* 7.0* 10.7* 10.4* 10.1*  HCT 23.8* 21.9* 32.1* 32.0* 31.3*  MCV 90.8 90.9 90.2 90.7 91.5  PLT 482* 417* 471* 422* 359    Basic Metabolic Panel: Recent Labs  Lab 08/25/23 0339 08/26/23 0430 08/27/23 0405 08/28/23 0450 08/29/23 0407 08/30/23 0320  NA 139 140 139 140 136 138  K 4.0 3.6 3.8 3.8 3.8 4.1  CL 107 107 105 105 105 104  CO2 25 27 27 26 25 27   GLUCOSE 98 109* 100* 99 96 108*  BUN 18 18 17 17 19 20   CREATININE 0.53 0.44 0.46 0.41* 0.41* 0.49  CALCIUM  7.8* 7.9* 8.0* 8.2* 8.5* 8.5*  MG 1.9 1.8 1.9  --  1.7 1.7  PHOS 2.8 3.3 3.4  --  3.8 4.2    GFR: Estimated Creatinine Clearance: 56.5 mL/min (by C-G formula based on SCr of 0.49 mg/dL).  Liver Function Tests: Recent Labs  Lab 08/26/23 0430 08/27/23 0405 08/28/23 0450 08/29/23 0407 08/30/23 0320  AST 14* 14* 14* 12* 12*  ALT 11 12 12 11 11   ALKPHOS 67 65 67 65 66  BILITOT 0.2 0.2 0.5 0.4 0.4  PROT 5.0* 4.7* 5.1* 5.2* 5.3*  ALBUMIN  1.6* 1.6* 1.9* 2.0* 2.0*    CBG: Recent Labs  Lab 08/28/23 1643 08/28/23 2313 08/29/23 0712 08/29/23 2350 08/30/23 0457  GLUCAP 115* 107* 102* 104* 103*     No results found for this or any previous visit (from the past 240 hours).       Radiology Studies: IR Catheter Tube Change Result Date: 08/30/2023 INDICATION: Clinical concern for for cutaneous drainage catheter mild dysfunction/malposition EXAM: IR CATHETER TUBE CHANGE MEDICATIONS: The patient is currently admitted to the hospital and receiving intravenous antibiotics.  The antibiotics were administered within an appropriate time frame prior to the initiation of the procedure. ANESTHESIA/SEDATION: Fentanyl  1 mcg IV; Versed  50 mg IV Moderate Sedation Time:  15 The patient was continuously monitored during the procedure by the interventional radiology nurse under my direct supervision. COMPLICATIONS: None immediate. PROCEDURE: Informed written consent was obtained from the patient after Arlin Sass thorough discussion of the procedural risks, benefits and alternatives. All questions were addressed. Maximal Sterile Barrier Technique was utilized including caps, mask, sterile gowns, sterile gloves, sterile drape, hand hygiene and skin antiseptic. Derhonda Eastlick timeout was performed prior to the initiation of the procedure. The drain site was prepped and draped in usual sterile fashion. Hudsen Fei gentle hand injection of contrast agent was performed which demonstrated filling of Kaeli Nichelson residual cavity just adjacent to the percutaneous drainage catheter. Ellamay Fors definite fistula was not seen. Next, the existing drainage catheter was removed over Wynter Grave guidewire. Amaree Loisel new 10 French percutaneous drain was placed more cranially into the central portion of the residual collection. This was irrigated and  an additional 20 cc of material were collected. The tube was then secured in place using graded suture material and placed to bulb suction drainage. IMPRESSION: 1. Repositioning of the slightly displaced percutaneous drainage catheter within the center of the residual fluid collection. 2. No definite fistula identified. 3. Continue percutaneous catheter drainage and routine drain management. Electronically Signed   By: Maude Naegeli M.D.   On: 08/30/2023 11:29   CT ABDOMEN PELVIS W CONTRAST Result Date: 08/29/2023 CLINICAL DATA:  Follow-up drainage. EXAM: CT ABDOMEN AND PELVIS WITH CONTRAST TECHNIQUE: Multidetector CT imaging of the abdomen and pelvis was performed using the standard protocol following bolus administration of intravenous  contrast. RADIATION DOSE REDUCTION: This exam was performed according to the departmental dose-optimization program which includes automated exposure control, adjustment of the mA and/or kV according to patient size and/or use of iterative reconstruction technique. CONTRAST:  75mL OMNIPAQUE  IOHEXOL  350 MG/ML SOLN COMPARISON:  CT abdomen pelvis dated 08/23/2023. FINDINGS: Lower chest: Bibasal subpleural atelectasis. There is coronary vascular calcification. No intra-abdominal free air.  Trace subhepatic free fluid. Hepatobiliary: Small cyst in the left lobe of the liver. No biliary dilatation. The gallbladder is unremarkable. Pancreas: Unremarkable. No pancreatic ductal dilatation or surrounding inflammatory changes. Spleen: Normal in size without focal abnormality. Adrenals/Urinary Tract: The adrenal glands are unremarkable. Mild left hydronephrosis, progressed since the prior CT. Left ureteral stent with proximal tip in the interpolar collecting system and distal end in the urinary bladder. There is Hays Dunnigan 1 cm stone in the distal left ureter along the catheter is similar location as the prior CT. No hydronephrosis on the right. Small bilateral renal cysts or dilated calyx. The urinary bladder is grossly unremarkable. Stomach/Bowel: Postsurgical changes of the bowel with Deng Kemler left anterior colostomy. Mild dilatation of several small bowel loops measure up to approximately 3 cm. Gage Treiber gradual transition in the anterior right pelvis noted. Findings favor to represent an ileus. Crysta Gulick small-bowel obstruction secondary to peritoneal adhesions is considered less likely. Small-bowel series may provide better evaluation if clinically indicated. The appendix is unremarkable as visualized. Vascular/Lymphatic: Advanced aortoiliac atherosclerotic disease. The IVC is unremarkable. No portal venous gas. There is no adenopathy. Reproductive: The uterus is grossly unremarkable. No suspicious adnexal masses. Other: Right transgluteal pigtail  drainage catheter with tip in the posterior pelvis. There is Zayra Devito 5.3 x 2.9 cm loculated collection just superior to the pigtail tip of the catheter in the posterior pelvis, relatively similar to prior CT. There has been interval removal of the right anterior percutaneous drainage catheter. Musculoskeletal: Degenerative changes of the spine. No acute osseous pathology. Midline vertical anterior pelvic wall open wound. IMPRESSION: 1. Right transgluteal pigtail drainage catheter with tip in the posterior pelvis. Anasofia Micallef 5.3 x 2.9 cm loculated collection just superior to the pigtail tip of the catheter in the posterior pelvis, relatively similar to prior CT. 2. Mild left hydronephrosis, progressed since the prior CT. Left ureteral stent in place. Skyeler Scalese 1 cm stone in the distal left ureter along the catheter is similar location as the prior CT. 3. Postsurgical changes of the bowel with Zidan Helget left anterior colostomy. Mild dilatation of several small bowel loops favor to represent an ileus. Kateryna Grantham small-bowel obstruction secondary to peritoneal adhesions is considered less likely. Small-bowel series may provide better evaluation if clinically indicated. 4.  Aortic Atherosclerosis (ICD10-I70.0). Electronically Signed   By: Vanetta Chou M.D.   On: 08/29/2023 14:33   DG Abd Portable 1V Result Date: 08/29/2023 CLINICAL DATA:  Small bowel obstruction. EXAM:  PORTABLE ABDOMEN - 1 VIEW COMPARISON:  08/28/2023 FINDINGS: NG tube tip is positioned in the proximal stomach. Side port of the NG tube is in the region of the GE junction in tube could be advanced 3-4 cm to ensure side port placement below the GE junction as clinically warranted. Left internal ureteral stent device again noted. Percutaneous pigtail drain overlies the right abdomen and pelvis with formed loop overlying the central upper pelvis. Diffuse gaseous bowel distension noted with some small bowel loops measuring up to 3.6 cm diameter. IMPRESSION: NG tube tip is positioned in the  proximal stomach. Side port of the NG tube is in the region of the GE junction and tube could be advanced 3-4 cm to ensure side port placement below the GE junction as clinically Diffuse gaseous bowel distension. Electronically Signed   By: Camellia Candle M.D.   On: 08/29/2023 07:02        Scheduled Meds:  sodium chloride    Intravenous Once   acetaminophen   1,000 mg Oral Q6H   bisacodyl   10 mg Rectal Daily   carvedilol   6.25 mg Oral BID WC   Chlorhexidine  Gluconate Cloth  6 each Topical Daily   enoxaparin  (LOVENOX ) injection  40 mg Subcutaneous Q24H   feeding supplement  237 mL Oral BID BM   ferrous sulfate   325 mg Oral BID WC   folic acid   1 mg Oral Daily   latanoprost   1 drop Both Eyes QHS   methocarbamol   500 mg Oral TID   Or   methocarbamol  (ROBAXIN ) injection  500 mg Intravenous TID   pantoprazole  (PROTONIX ) IV  40 mg Intravenous Q12H   polyethylene glycol  17 g Oral BID   timolol   1 drop Both Eyes BID   traZODone   50 mg Oral QHS   Continuous Infusions:  TPN ADULT (ION) 70 mL/hr at 08/29/23 1734   TPN ADULT (ION)       LOS: 31 days    Time spent: over 30 min    Meliton Monte, MD [facility]   To contact the attending provider between 7A-7P or the covering provider during after hours 7P-7A, please log into the web site [email or web address] and access using universal [place] password for that web site. If you do not have the password, please call the hospital operator.  08/30/2023, 2:46 PM

## 2023-08-30 NOTE — Progress Notes (Cosign Needed Addendum)
    Durable Medical Equipment  (From admission, onward)           Start     Ordered   08/30/23 1005  For home use only DME Hospital bed  Once       Comments: Therapeutic mattress  Question Answer Comment  Length of Need 6 Months   Patient has (list medical condition): debility, weakness   The above medical condition requires: Patient requires the ability to reposition frequently   Bed type Semi-electric      08/30/23 1004   08/04/23 1555  For home use only DME Walker rolling  Once       Question Answer Comment  Walker: With 5 Inch Wheels   Patient needs a walker to treat with the following condition Impaired functional mobility, balance, gait, and endurance      08/04/23 1554   08/04/23 1555  For home use only DME Bedside commode  Once       Question:  Patient needs a bedside commode to treat with the following condition  Answer:  Balance problem   08/04/23 1554          Patient confined to one room that does not have toilet facilities, will need bedside commode   Patient has debility weakness that requires frequent repositioning of the body at a degree unattainable by an ordinary bed

## 2023-08-30 NOTE — Discharge Instructions (Addendum)
 Wet to Dry WOUND CARE: - Change dressing twice daily - Supplies: sterile saline, kerlex, scissors, ABD pads, tape  Remove dressing and all packing carefully, moistening with sterile saline as needed to avoid packing/internal dressing sticking to the wound. 2.   Clean edges of skin around the wound with water/gauze, making sure there is no tape debris or leakage left on skin that could cause skin irritation or breakdown. 3.   Dampen and clean kerlex with sterile saline and pack wound from wound base to skin level, making sure to take note of any possible areas of wound tracking, tunneling and packing appropriately. Wound can be packed loosely. Trim kerlex to size if a whole kerlex is not required. 4.   Cover wound with a dry ABD pad and secure with tape.  5.   Write the date/time on the dry dressing/tape to better track when the last dressing change occurred. - apply any skin protectant/powder if recommended by clinician to protect skin/skin folds. - change dressing as needed if leakage occurs, wound gets contaminated, or patient requests to shower. - You may shower daily with wound open and following the shower the wound should be dried and a clean dressing placed.  - Medical grade tape as well as packing supplies can be found at [employer] on [address] or [facility] [employer] on [address]. The remaining supplies can be found at your local drug store, [employer] etc.  [facility]      [facility], [name]  OPEN ABDOMINAL SURGERY: POST OP INSTRUCTIONS  Always review your discharge instruction sheet given to you by the facility where your surgery was performed.  IF YOU HAVE DISABILITY OR FAMILY LEAVE FORMS, YOU MUST BRING THEM TO THE OFFICE FOR PROCESSING.  PLEASE DO NOT GIVE THEM TO YOUR DOCTOR.  A prescription for pain medication may be given to you upon discharge.  Take your pain medication as prescribed, if needed.  If narcotic pain medicine is not needed,  then you may take acetaminophen  (Tylenol ) or ibuprofen (Advil) as needed. Take your usually prescribed medications unless otherwise directed. If you need a refill on your pain medication, please contact your pharmacy. They will contact our office to request authorization.  Prescriptions will not be filled after 5pm or on week-ends. You should follow a light diet the first few days after arrival home, such as soup and crackers, pudding, etc.unless your doctor has advised otherwise. A high-fiber, low fat diet can be resumed as tolerated.   Be sure to include lots of fluids daily. Most patients will experience some swelling and bruising on the chest and neck area.  Ice packs will help.  Swelling and bruising can take several days to resolve Most patients will experience some swelling and bruising in the area of the incision. Ice pack will help. Swelling and bruising can take several days to resolve..  It is common to experience some constipation if taking pain medication after surgery.  Increasing fluid intake and taking a stool softener will usually help or prevent this problem from occurring.  A mild laxative (Milk of Magnesia or Miralax ) should be taken according to package directions if there are no bowel movements after 48 hours.  You may have steri-strips (small skin tapes) in place directly over the incision.  These strips should be left on the skin for 7-10 days.  If your surgeon used skin glue on the incision, you may shower in 24 hours.  The glue will flake off over the  next 2-3 weeks.  Any sutures or staples will be removed at the office during your follow-up visit. You may find that a light gauze bandage over your incision may keep your staples from being rubbed or pulled. You may shower and replace the bandage daily. ACTIVITIES:  You may resume regular (light) daily activities beginning the next day--such as daily self-care, walking, climbing stairs--gradually increasing activities as tolerated.   You may have sexual intercourse when it is comfortable.  Refrain from any heavy lifting or straining until approved by your doctor. You may drive when you no longer are taking prescription pain medication, you can comfortably wear a seatbelt, and you can safely maneuver your car and apply brakes Return to Work: ___________________________________ Angela [name] should see your doctor in the office for a follow-up appointment approximately two weeks after your surgery.  Make sure that you call for this appointment within a day or two after you arrive home to insure a convenient appointment time. OTHER INSTRUCTIONS:  _____________________________________________________________ _____________________________________________________________  WHEN TO CALL YOUR DOCTOR: Fever over 101.0 Inability to urinate Nausea and/or vomiting Extreme swelling or bruising Continued bleeding from incision. Increased pain, redness, or drainage from the incision. Difficulty swallowing or breathing Muscle cramping or spasms. Numbness or tingling in hands or feet or around lips.  The clinic staff is available to answer your questions during regular business hours.  Please don't hesitate to call and ask to speak to one of the nurses if you have concerns.  For further questions, please visit [email or web address]      Abscess Drain Care Instructions:  1. Flush drain once daily with at least 5 ml NS. 2. Change the dressing daily or as needed. 3. Keep the site clean and dry.  4. Empty the bulb as needed and document the date/time/volume.  Ok to shower but the site must be covered with an occlusive dressing.

## 2023-08-31 DIAGNOSIS — K631 Perforation of intestine (nontraumatic): Secondary | ICD-10-CM | POA: Diagnosis not present

## 2023-08-31 LAB — URINALYSIS, ROUTINE W REFLEX MICROSCOPIC
Bilirubin Urine: NEGATIVE
Glucose, UA: NEGATIVE mg/dL
Ketones, ur: NEGATIVE mg/dL
Nitrite: POSITIVE — AB
Protein, ur: 100 mg/dL — AB
RBC / HPF: >50 RBC/hpf (ref 0–5)
Specific Gravity, Urine: 1.024 (ref 1.005–1.030)
pH: 6 (ref 5.0–8.0)

## 2023-08-31 LAB — CBC WITH DIFFERENTIAL/PLATELET
Abs Immature Granulocytes: 0.05 K/uL (ref 0.00–0.07)
Basophils Absolute: 0 K/uL (ref 0.0–0.1)
Basophils Relative: 0 %
Eosinophils Absolute: 0.2 K/uL (ref 0.0–0.5)
Eosinophils Relative: 3 %
HCT: 31.9 % — ABNORMAL LOW (ref 36.0–46.0)
Hemoglobin: 10.2 g/dL — ABNORMAL LOW (ref 12.0–15.0)
Immature Granulocytes: 1 %
Lymphocytes Relative: 19 %
Lymphs Abs: 1.4 K/uL (ref 0.7–4.0)
MCH: 29.5 pg (ref 26.0–34.0)
MCHC: 32 g/dL (ref 30.0–36.0)
MCV: 92.2 fL (ref 80.0–100.0)
Monocytes Absolute: 0.7 K/uL (ref 0.1–1.0)
Monocytes Relative: 9 %
Neutro Abs: 5.1 K/uL (ref 1.7–7.7)
Neutrophils Relative %: 68 %
Platelets: 360 K/uL (ref 150–400)
RBC: 3.46 MIL/uL — ABNORMAL LOW (ref 3.87–5.11)
RDW: 18.2 % — ABNORMAL HIGH (ref 11.5–15.5)
WBC: 7.5 K/uL (ref 4.0–10.5)
nRBC: 0 % (ref 0.0–0.2)

## 2023-08-31 LAB — COMPREHENSIVE METABOLIC PANEL WITH GFR
ALT: 11 U/L (ref 0–44)
AST: 15 U/L (ref 15–41)
Albumin: 2 g/dL — ABNORMAL LOW (ref 3.5–5.0)
Alkaline Phosphatase: 63 U/L (ref 38–126)
Anion gap: 9 (ref 5–15)
BUN: 23 mg/dL (ref 8–23)
CO2: 26 mmol/L (ref 22–32)
Calcium: 8.4 mg/dL — ABNORMAL LOW (ref 8.9–10.3)
Chloride: 102 mmol/L (ref 98–111)
Creatinine, Ser: 0.5 mg/dL (ref 0.44–1.00)
GFR, Estimated: >60 mL/min (ref >60)
Glucose, Bld: 108 mg/dL — ABNORMAL HIGH (ref 70–99)
Potassium: 4.1 mmol/L (ref 3.5–5.1)
Sodium: 137 mmol/L (ref 135–145)
Total Bilirubin: 0.5 mg/dL (ref 0.0–1.2)
Total Protein: 5.2 g/dL — ABNORMAL LOW (ref 6.5–8.1)

## 2023-08-31 LAB — GLUCOSE, CAPILLARY
Glucose-Capillary: 96 mg/dL (ref 70–99)
Glucose-Capillary: 100 mg/dL — ABNORMAL HIGH (ref 70–99)
Glucose-Capillary: 83 mg/dL (ref 70–99)
Glucose-Capillary: 107 mg/dL — ABNORMAL HIGH (ref 70–99)

## 2023-08-31 LAB — MAGNESIUM: Magnesium: 2 mg/dL (ref 1.7–2.4)

## 2023-08-31 LAB — PHOSPHORUS: Phosphorus: 4.5 mg/dL (ref 2.5–4.6)

## 2023-08-31 MED ORDER — TRAVASOL 10 % IV SOLN
INTRAVENOUS | Status: AC
  Filled 2023-08-31: qty 445.2

## 2023-08-31 MED ORDER — FLUCONAZOLE 150 MG PO TABS
150.0000 mg | ORAL_TABLET | Freq: Once | ORAL | Status: AC
  Administered 2023-08-31: 150 mg via ORAL
  Filled 2023-08-31: qty 1

## 2023-08-31 MED ORDER — TRAVASOL 10 % IV SOLN
INTRAVENOUS | Status: DC
  Filled 2023-08-31: qty 890.4

## 2023-08-31 NOTE — Progress Notes (Signed)
 13 Days Post-Op  Subjective: CC: Patient denies nausea/vomiting, has a decent amount of stool in bad with some gas. NGT out. She denies abdominal pain, however, she is complaining of tenderness around transgluteal drain. She is NPO pending IR drain repositioning.    Objective: Vital signs in last 24 hours: Temp:  [98.2 F (36.8 C)-98.7 F (37.1 C)] 98.2 F (36.8 C) (07/24 0521) Pulse Rate:  [67-75] 73 (07/24 0522) Resp:  [12-20] 12 (07/24 0522) BP: (138-160)/(70-83) 148/78 (07/24 0522) SpO2:  [93 %-100 %] 96 % (07/24 0522) Weight:  [55.7 kg] 55.7 kg (07/24 0522) Last BM Date : 08/30/23  Intake/Output from previous day: 07/23 0701 - 07/24 0700 In: 20 [I.V.:20] Out: 785 [Urine:700; Drains:45; Stool:40] Intake/Output this shift: No intake/output data recorded.  PE: General appearance: alert and cooperative GI: Soft, LLQ ttp, no rigidity or guarding. Ostomy viable. Midline wound remains overall clean with mostly granulation tissue and some fibrinous tissue at the base, 1.5cm are of tracking at the most inferior aspect, no obvious separation of fascia, no drainage. JP RLQ - sanguinous, good ostomy output- brown, w/o evidence of bleeding.     Lab Results:  Recent Labs    08/30/23 0320 08/31/23 0358  WBC 7.7 7.5  HGB 10.1* 10.2*  HCT 31.3* 31.9*  PLT 359 360   BMET Recent Labs    08/30/23 0320 08/31/23 0358  NA 138 137  K 4.1 4.1  CL 104 102  CO2 27 26  GLUCOSE 108* 108*  BUN 20 23  CREATININE 0.49 0.50  CALCIUM  8.5* 8.4*   PT/INR No results for input(s): LABPROT, INR in the last 72 hours. CMP     Component Value Date/Time   NA 137 08/31/2023 0358   NA 140 11/10/2007 0000   K 4.1 08/31/2023 0358   CL 102 08/31/2023 0358   CO2 26 08/31/2023 0358   GLUCOSE 108 (H) 08/31/2023 0358   BUN 23 08/31/2023 0358   BUN 10 11/10/2007 0000   CREATININE 0.50 08/31/2023 0358   CREATININE 0.74 06/26/2020 1522   CALCIUM  8.4 (L) 08/31/2023 0358   PROT 5.2 (L)  08/31/2023 0358   ALBUMIN  2.0 (L) 08/31/2023 0358   AST 15 08/31/2023 0358   ALT 11 08/31/2023 0358   ALKPHOS 63 08/31/2023 0358   BILITOT 0.5 08/31/2023 0358   GFRNONAA >60 08/31/2023 0358   Lipase     Component Value Date/Time   LIPASE <10 (L) 07/30/2023 0936    Studies/Results: IR Catheter Tube Change Result Date: 08/30/2023 INDICATION: Clinical concern for for cutaneous drainage catheter mild dysfunction/malposition EXAM: IR CATHETER TUBE CHANGE MEDICATIONS: The patient is currently admitted to the hospital and receiving intravenous antibiotics. The antibiotics were administered within an appropriate time frame prior to the initiation of the procedure. ANESTHESIA/SEDATION: Fentanyl  1 mcg IV; Versed  50 mg IV Moderate Sedation Time:  15 The patient was continuously monitored during the procedure by the interventional radiology nurse under my direct supervision. COMPLICATIONS: None immediate. PROCEDURE: Informed written consent was obtained from the patient after a thorough discussion of the procedural risks, benefits and alternatives. All questions were addressed. Maximal Sterile Barrier Technique was utilized including caps, mask, sterile gowns, sterile gloves, sterile drape, hand hygiene and skin antiseptic. A timeout was performed prior to the initiation of the procedure. The drain site was prepped and draped in usual sterile fashion. A gentle hand injection of contrast agent was performed which demonstrated filling of a residual cavity just adjacent to the percutaneous drainage  catheter. A definite fistula was not seen. Next, the existing drainage catheter was removed over a guidewire. A new 10 French percutaneous drain was placed more cranially into the central portion of the residual collection. This was irrigated and an additional 20 cc of material were collected. The tube was then secured in place using graded suture material and placed to bulb suction drainage. IMPRESSION: 1. Repositioning  of the slightly displaced percutaneous drainage catheter within the center of the residual fluid collection. 2. No definite fistula identified. 3. Continue percutaneous catheter drainage and routine drain management. Electronically Signed   By: Maude Naegeli M.D.   On: 08/30/2023 11:29   CT ABDOMEN PELVIS W CONTRAST Result Date: 08/29/2023 CLINICAL DATA:  Follow-up drainage. EXAM: CT ABDOMEN AND PELVIS WITH CONTRAST TECHNIQUE: Multidetector CT imaging of the abdomen and pelvis was performed using the standard protocol following bolus administration of intravenous contrast. RADIATION DOSE REDUCTION: This exam was performed according to the departmental dose-optimization program which includes automated exposure control, adjustment of the mA and/or kV according to patient size and/or use of iterative reconstruction technique. CONTRAST:  75mL OMNIPAQUE  IOHEXOL  350 MG/ML SOLN COMPARISON:  CT abdomen pelvis dated 08/23/2023. FINDINGS: Lower chest: Bibasal subpleural atelectasis. There is coronary vascular calcification. No intra-abdominal free air.  Trace subhepatic free fluid. Hepatobiliary: Small cyst in the left lobe of the liver. No biliary dilatation. The gallbladder is unremarkable. Pancreas: Unremarkable. No pancreatic ductal dilatation or surrounding inflammatory changes. Spleen: Normal in size without focal abnormality. Adrenals/Urinary Tract: The adrenal glands are unremarkable. Mild left hydronephrosis, progressed since the prior CT. Left ureteral stent with proximal tip in the interpolar collecting system and distal end in the urinary bladder. There is a 1 cm stone in the distal left ureter along the catheter is similar location as the prior CT. No hydronephrosis on the right. Small bilateral renal cysts or dilated calyx. The urinary bladder is grossly unremarkable. Stomach/Bowel: Postsurgical changes of the bowel with a left anterior colostomy. Mild dilatation of several small bowel loops measure up to  approximately 3 cm. A gradual transition in the anterior right pelvis noted. Findings favor to represent an ileus. A small-bowel obstruction secondary to peritoneal adhesions is considered less likely. Small-bowel series may provide better evaluation if clinically indicated. The appendix is unremarkable as visualized. Vascular/Lymphatic: Advanced aortoiliac atherosclerotic disease. The IVC is unremarkable. No portal venous gas. There is no adenopathy. Reproductive: The uterus is grossly unremarkable. No suspicious adnexal masses. Other: Right transgluteal pigtail drainage catheter with tip in the posterior pelvis. There is a 5.3 x 2.9 cm loculated collection just superior to the pigtail tip of the catheter in the posterior pelvis, relatively similar to prior CT. There has been interval removal of the right anterior percutaneous drainage catheter. Musculoskeletal: Degenerative changes of the spine. No acute osseous pathology. Midline vertical anterior pelvic wall open wound. IMPRESSION: 1. Right transgluteal pigtail drainage catheter with tip in the posterior pelvis. A 5.3 x 2.9 cm loculated collection just superior to the pigtail tip of the catheter in the posterior pelvis, relatively similar to prior CT. 2. Mild left hydronephrosis, progressed since the prior CT. Left ureteral stent in place. A 1 cm stone in the distal left ureter along the catheter is similar location as the prior CT. 3. Postsurgical changes of the bowel with a left anterior colostomy. Mild dilatation of several small bowel loops favor to represent an ileus. A small-bowel obstruction secondary to peritoneal adhesions is considered less likely. Small-bowel series may provide better  evaluation if clinically indicated. 4.  Aortic Atherosclerosis (ICD10-I70.0). Electronically Signed   By: Vanetta Chou M.D.   On: 08/29/2023 14:33    Anti-infectives: Anti-infectives (From admission, onward)    Start     Dose/Rate Route Frequency Ordered Stop    08/18/23 1800  Ampicillin -Sulbactam (UNASYN ) 3 g in sodium chloride  0.9 % 100 mL IVPB  Status:  Discontinued        3 g 200 mL/hr over 30 Minutes Intravenous Every 6 hours 08/18/23 0922 08/27/23 1241   08/06/23 1130  acyclovir  (ZOVIRAX ) 200 MG capsule 400 mg  Status:  Discontinued        400 mg Oral 3 times daily 08/06/23 1037 08/08/23 1424   08/06/23 1100  piperacillin -tazobactam (ZOSYN ) IVPB 3.375 g        3.375 g 12.5 mL/hr over 240 Minutes Intravenous Every 8 hours 08/06/23 1051 08/18/23 1259   07/30/23 1800  piperacillin -tazobactam (ZOSYN ) IVPB 3.375 g        3.375 g 12.5 mL/hr over 240 Minutes Intravenous Every 8 hours 07/30/23 1332 08/04/23 2359   07/30/23 1400  piperacillin -tazobactam (ZOSYN ) IVPB 3.375 g  Status:  Discontinued        3.375 g 100 mL/hr over 30 Minutes Intravenous Every 8 hours 07/30/23 1329 07/30/23 1332   07/30/23 1015  piperacillin -tazobactam (ZOSYN ) IVPB 3.375 g        3.375 g 100 mL/hr over 30 Minutes Intravenous  Once 07/30/23 1013 07/30/23 1311        Assessment/Plan S/p ex lap with Hartmann's procedure for stercoral colitis with ischemia and perforation with feculent peritonitis, Dr. Polly 6/22   -CT scan 7/22 showed Right transgluteal pigtail drainage catheter with tip in the posterior pelvis. A 5.3 x 2.9 cm loculated collection just superior to the pigtail tip of the catheter in the posterior pelvis, relatively similar to prior CT. - CT scan 7/17 shows dilated stomach and SBO, some improvement in collections  - 7/17 RLQ drain removed, R transgluteal drain left in place with sanguinous output. - Continue BID dressing changes to midline - Dakins added 7/19 for 3d for exam finding of pseudomonas colonization. Now appears to be clearing up.  - WOCN following for new ostomy.  - Mobilize, PT - recommending HH - Pulm toilet  -IR drain repositioned 7/23   FEN: NGT out, Soft diet, TPN  VTE: Lovenox  ID: Abx stopped 7/20. Afebrile. WBC on 7/24 down to  7.5 from 7.7.    LOS: 32 days    Eulah Hammonds, [facility] 08/31/2023, 9:10 AM Please see Amion for pager number during day hours 7:00am-4:30pm

## 2023-08-31 NOTE — Progress Notes (Signed)
 PROGRESS NOTE    Angela [name]  FMW:[id number] DOB: September 26, 1950 DOA: 07/30/2023 PCP: Catherine Charlies LABOR, DO  Chief Complaint  Patient presents with   Abdominal Pain   Hematuria   Shortness of Breath    Brief Narrative:   Patient is Angela [name] 73 y.o.  female with history of HTN, HLD who presented with worsening abdominal pain-patient was found to have sigmoid perforation with feculent peritonitis in the setting of stercoral colitis.  Patient underwent sigmoidectomy with end colostomy on 6/22-unfortunately postop course complicated by ileus and pelvic residual abscesses requiring IR drain placement.     Significant events: 6/21>> evaluated in the ED for abdominal pain-found to have left mid ureteral stone-9 mm with hydronephrosis-and large volume of stool-managed with supportive care and discharged home. 6/22>> worsening abdominal pain-repeat CT scan consistent with bowel perforation-surgery consult-admit to [facility]. 6/29>> CT abdomen with pelvic abscess. 6/30>> CT-guided drain and pelvic abscess by IR 7/04>> vomiting-recurrent ileus-NG tube reinserted 7/05>> TNA started-repeat CT abdomen increased size of abscess. 7/07>> IR placed RLQ drain and right transgluteal pelvic drain. 7/10>> NGT removed   Significant studies: 6/22>> CT renal stone study: Interval development of intraperitoneal free air-moderate volume free fluid-consistent with bowel perforation, persistent large volume stool with dilated sigmoid colon.  Similar location of left ureteral stone-9 mm. 6/29>> CT abdomen/pelvis: S/p partial colectomy-LLQ ostomy-complex free fluid with multiple foci of air-most pronounced in the surgical bed-possibility of superimposed infection or bowel leak cannot be excluded. 6/30>> echo: EF 60-65%. 7/05>> CT abdomen/pelvis: Previous collection with pigtail has increased in size, other less well-defined but enlarging peritoneal fluid collections.  Persistent-partially obstructing 11 mm calculus in the distal  left ureter.  Persistent fluid loops of dilated small bowel-likely ileus.   Significant microbiology data: 6/22>> urine culture: No growth 6/22>> blood culture: No growth 6/30>> pelvic abscess culture: Gram-negative rods/gram-positive cocci-mixed anaerobic flora. 7/07>> Pelvic abscess culture:moderate proteus and enterococcus   Procedures: 6/22>> sigmoidectomy with end colostomy.  By general surgeon Dr. Polly 6/30>> CT-guided drain and pelvic abscess by IR 08/19/2023 1.  Cystoscopy with left ureteral stent placement. 2. left retrograde pyelogram with interpretation. 3. Fluoroscopy <1 hour with intraoperative interpretation by urologist Dr. Arlyss Foot   Consults: General Surgery IR Urology  Assessment & Plan:   Principal Problem:   Bowel perforation (HCC) Active Problems:   Hydroureteronephrosis   Chronic idiopathic constipation   Metabolic acidosis with increased anion gap and accumulation of organic acids   Hyponatremia   Essential hypertension   Glaucoma   Perforated sigmoid colon (HCC)   Protein-calorie malnutrition, severe  Sepsis secondary to stercoral colitis with ischemia/perforation of sigmoid colon with feculent peritonitis S/p ex lap sigmoidectomy with end colostomy 6/22  complicated by postoperative ileus and then residual pelvic abscess requiring CT-guided drain placement by IR General surgery and IR following S/p repositioning of slightly displaced percutaneous drainage catheter within the center of the residual fluid collection on 7/23 Surgery recommending CLD and ADAT - dressing changes per surgery.  Dakins 7/19 x3 days for pseudomonas.   Currently on TPN, will follow how she does with PO - calorie count to plan weaning of TPN Abscess RLQ growing proteus mirabilis, enterococcus faecalis, bacteroides thetaiotaomicron, beta lactamase positive Abx have been completed as of 7/20, appreciate surgery recs   Hydronephrosis secondary to distal left ureteral  stone Urology following-with recommendations for supportive care for now-now that her medical problems have more or less stabilized underwent left ureteral stent placement by urology Dr. Arlyss Foot on 08/18/2023 Plan  for follow up after discharge, they're going to work on follow up today   Hospital delirium Secondary to acute illness/narcotics Now resolved   Normocytic anemia Due to acute illness,+/- coffee-ground emesis on 7/7 Hemoglobin stable although low, type screen done will continue to monitor transfuse if she drops consistently close to hemoglobin of 7.   Thrombocytosis Likely reactive Supportive care   Coffee-ground NG tube emesis Seems to have resolved Continue PPI S/p 2 units on 7/20  Hb stable  Hypomagnesemia/hypophosphatemia/hypokalemia Secondary to NG tube suctioning Resolved.   HTN Holding home BP meds for now Will monitor    Lower extremity edema Secondary to hypoalbuminemia/IV fluid resuscitation in the setting of acute illness Echo with preserved EF-UA negative for proteinuria Consider diuretics when more stable   Chronic idiopathic constipation Bowel function recovering-brown stools Once more stable-will need to be placed on Averleigh Savary bowel regimen.   Odynophagia secondary to oral/aphthous ulcers Improved on Valtrex and Magic mouthwash Supportive care   Glaucoma Timolol  eyedrops   Insomnia Appears to be primarily due to pain-anxiety Schedule trazodone  at night If no response-use IV Benadryl    Debility/deconditioning PT OT eval-Home health recommended.     DVT prophylaxis: lovenox  Code Status: full Family Communication: none Disposition:   Status is: Inpatient Remains inpatient appropriate because: need for continued inpatient care   Consultants:  IR surgery  Procedures:   6/22  Sigmoidectomy with end colostomy   6/30 CT guided drain placement   7/7 CT guided placement of RLQ drain and right transgluteal pelvic drain    7/11 1.  Cystoscopy with left ureteral stent placement 2. left retrograde pyelogram with interpretation 3. Fluoroscopy <1 hour with intraoperative interpretation    Antimicrobials:  Anti-infectives (From admission, onward)    Start     Dose/Rate Route Frequency Ordered Stop   08/31/23 1115  fluconazole  (DIFLUCAN ) tablet 150 mg        150 mg Oral  Once 08/31/23 1029 08/31/23 1250   08/18/23 1800  Ampicillin -Sulbactam (UNASYN ) 3 g in sodium chloride  0.9 % 100 mL IVPB  Status:  Discontinued        3 g 200 mL/hr over 30 Minutes Intravenous Every 6 hours 08/18/23 0922 08/27/23 1241   08/06/23 1130  acyclovir  (ZOVIRAX ) 200 MG capsule 400 mg  Status:  Discontinued        400 mg Oral 3 times daily 08/06/23 1037 08/08/23 1424   08/06/23 1100  piperacillin -tazobactam (ZOSYN ) IVPB 3.375 g        3.375 g 12.5 mL/hr over 240 Minutes Intravenous Every 8 hours 08/06/23 1051 08/18/23 1259   07/30/23 1800  piperacillin -tazobactam (ZOSYN ) IVPB 3.375 g        3.375 g 12.5 mL/hr over 240 Minutes Intravenous Every 8 hours 07/30/23 1332 08/04/23 2359   07/30/23 1400  piperacillin -tazobactam (ZOSYN ) IVPB 3.375 g  Status:  Discontinued        3.375 g 100 mL/hr over 30 Minutes Intravenous Every 8 hours 07/30/23 1329 07/30/23 1332   07/30/23 1015  piperacillin -tazobactam (ZOSYN ) IVPB 3.375 g        3.375 g 100 mL/hr over 30 Minutes Intravenous  Once 07/30/23 1013 07/30/23 1311       Subjective: No new complaints Hasn't been eating much   Objective: Vitals:   08/30/23 2328 08/31/23 0521 08/31/23 0522 08/31/23 1143  BP: (!) 149/76 (!) 148/78 (!) 148/78 130/71  Pulse: 71 75 73 65  Resp: 14 14 12  (!) 21  Temp: 98.7 F (37.1 C) 98.2  F (36.8 C)  97.8 F (36.6 C)  TempSrc: Oral Oral  Oral  SpO2: 93% 99% 96% 96%  Weight:   55.7 kg   Height:        Intake/Output Summary (Last 24 hours) at 08/31/2023 1527 Last data filed at 08/31/2023 0700 Gross per 24 hour  Intake 20 ml  Output 785 ml  Net  -765 ml   Filed Weights   08/26/23 0500 08/27/23 0500 08/31/23 0522  Weight: 58.1 kg 59.1 kg 55.7 kg    Examination:  General: No acute distress. Cardiovascular: RRR Lungs: unlabored Abdomen: s/nt/nd - ostomy with brown stool, dressing intact Purulent output in drain Neurological: Alert and oriented 3. Moves all extremities 4 with equal strength. Cranial nerves II through XII grossly intact. Extremities: No clubbing or cyanosis. No edema.  Data Reviewed: I have personally reviewed following labs and imaging studies  CBC: Recent Labs  Lab 08/27/23 0405 08/28/23 0450 08/29/23 0407 08/30/23 0320 08/31/23 0358  WBC 8.0 12.2* 8.2 7.7 7.5  NEUTROABS 5.2 9.9* 5.5 5.1 5.1  HGB 7.0* 10.7* 10.4* 10.1* 10.2*  HCT 21.9* 32.1* 32.0* 31.3* 31.9*  MCV 90.9 90.2 90.7 91.5 92.2  PLT 417* 471* 422* 359 360    Basic Metabolic Panel: Recent Labs  Lab 08/26/23 0430 08/27/23 0405 08/28/23 0450 08/29/23 0407 08/30/23 0320 08/31/23 0358  NA 140 139 140 136 138 137  K 3.6 3.8 3.8 3.8 4.1 4.1  CL 107 105 105 105 104 102  CO2 27 27 26 25 27 26   GLUCOSE 109* 100* 99 96 108* 108*  BUN 18 17 17 19 20 23   CREATININE 0.44 0.46 0.41* 0.41* 0.49 0.50  CALCIUM  7.9* 8.0* 8.2* 8.5* 8.5* 8.4*  MG 1.8 1.9  --  1.7 1.7 2.0  PHOS 3.3 3.4  --  3.8 4.2 4.5    GFR: Estimated Creatinine Clearance: 56.5 mL/min (by C-G formula based on SCr of 0.5 mg/dL).  Liver Function Tests: Recent Labs  Lab 08/27/23 0405 08/28/23 0450 08/29/23 0407 08/30/23 0320 08/31/23 0358  AST 14* 14* 12* 12* 15  ALT 12 12 11 11 11   ALKPHOS 65 67 65 66 63  BILITOT 0.2 0.5 0.4 0.4 0.5  PROT 4.7* 5.1* 5.2* 5.3* 5.2*  ALBUMIN  1.6* 1.9* 2.0* 2.0* 2.0*    CBG: Recent Labs  Lab 08/30/23 0457 08/30/23 1750 08/30/23 2326 08/31/23 0519 08/31/23 1141  GLUCAP 103* 108* 115* 96 100*     No results found for this or any previous visit (from the past 240 hours).       Radiology Studies: IR Catheter Tube  Change Result Date: 08/30/2023 INDICATION: Clinical concern for for cutaneous drainage catheter mild dysfunction/malposition EXAM: IR CATHETER TUBE CHANGE MEDICATIONS: The patient is currently admitted to the hospital and receiving intravenous antibiotics. The antibiotics were administered within an appropriate time frame prior to the initiation of the procedure. ANESTHESIA/SEDATION: Fentanyl  1 mcg IV; Versed  50 mg IV Moderate Sedation Time:  15 The patient was continuously monitored during the procedure by the interventional radiology nurse under my direct supervision. COMPLICATIONS: None immediate. PROCEDURE: Informed written consent was obtained from the patient after Vanassa Penniman thorough discussion of the procedural risks, benefits and alternatives. All questions were addressed. Maximal Sterile Barrier Technique was utilized including caps, mask, sterile gowns, sterile gloves, sterile drape, hand hygiene and skin antiseptic. Aireana Ryland timeout was performed prior to the initiation of the procedure. The drain site was prepped and draped in usual sterile fashion. Satya Bohall gentle hand injection  of contrast agent was performed which demonstrated filling of Wafaa Deemer residual cavity just adjacent to the percutaneous drainage catheter. Mayrani Khamis definite fistula was not seen. Next, the existing drainage catheter was removed over Tashena Ibach guidewire. Rodrickus Min new 10 French percutaneous drain was placed more cranially into the central portion of the residual collection. This was irrigated and an additional 20 cc of material were collected. The tube was then secured in place using graded suture material and placed to bulb suction drainage. IMPRESSION: 1. Repositioning of the slightly displaced percutaneous drainage catheter within the center of the residual fluid collection. 2. No definite fistula identified. 3. Continue percutaneous catheter drainage and routine drain management. Electronically Signed   By: Maude Naegeli M.D.   On: 08/30/2023 11:29        Scheduled  Meds:  sodium chloride    Intravenous Once   acetaminophen   1,000 mg Oral Q6H   bisacodyl   10 mg Rectal Daily   carvedilol   6.25 mg Oral BID WC   Chlorhexidine  Gluconate Cloth  6 each Topical Daily   enoxaparin  (LOVENOX ) injection  40 mg Subcutaneous Q24H   feeding supplement  237 mL Oral BID BM   ferrous sulfate   325 mg Oral BID WC   folic acid   1 mg Oral Daily   latanoprost   1 drop Both Eyes QHS   methocarbamol   500 mg Oral TID   Or   methocarbamol  (ROBAXIN ) injection  500 mg Intravenous TID   pantoprazole  (PROTONIX ) IV  40 mg Intravenous Q12H   polyethylene glycol  17 g Oral BID   timolol   1 drop Both Eyes BID   traZODone   50 mg Oral QHS   Continuous Infusions:  TPN ADULT (ION) 70 mL/hr at 08/30/23 1816   TPN ADULT (ION)       LOS: 32 days    Time spent: over 30 min    Meliton Monte, MD [facility]   To contact the attending provider between 7A-7P or the covering provider during after hours 7P-7A, please log into the web site [email or web address] and access using universal [place] password for that web site. If you do not have the password, please call the hospital operator.  08/31/2023, 3:27 PM

## 2023-08-31 NOTE — Plan of Care (Signed)

## 2023-08-31 NOTE — Progress Notes (Signed)
 0700 Pt medicated for pain at start of shift, given Dilaudid  1 mg slow ivp, 2230 Pt given Oxycontin  10 mg for 7/10 pain to back.  Pt rested well during the shift.  )700 minimal drainage seo sang from JP drain 5 ml, pt abdominal dressing changed.  Pt denies any other concerns.

## 2023-08-31 NOTE — Progress Notes (Signed)
   08/31/23 1021  Mobility  Activity Ambulated with assistance in hallway  Level of Assistance Standby assist, set-up cues, supervision of patient - no hands on  Assistive Device Front wheel walker  Distance Ambulated (ft) 400 ft  Activity Response Tolerated well  Mobility Referral Yes  Mobility visit 1 Mobility  Mobility Specialist Start Time (ACUTE ONLY) 1021  Mobility Specialist Stop Time (ACUTE ONLY) 1029  Mobility Specialist Time Calculation (min) (ACUTE ONLY) 8 min   Mobility Specialist: Progress Note  Pt agreeable to mobility session - received ambulating in hallway with visitor. Pt was asymptomatic throughout session with no complaints. Returned to sitting EOB with all needs met - call bell within reach. Visitor present. Pt states she ambulates multiple times throughout the day.   Virgle Boards, BS Mobility Specialist Please contact via SecureChat or  Rehab office at [phone].

## 2023-08-31 NOTE — Progress Notes (Signed)
 Nutrition Brief Note  Patient advanced to GI soft diet today. Educated patient family on what foods can be brought in from outside facility to augment intake, should patient request it. Monitoring intake in an effort to wean TPN. Calorie count initiated. Consuming pancakes at time of bedside visit. Sister in law reports patient did not tolerate first couple of bites due to eating too vigorously.    Transgluteal drain repositioned yesterday and draining blood-tinged milky liquid this morning. She endorses pain and tenderness at the site. Ostomy functioning well with brown output noted in bag.  INTERVENTION:  Monitor for diet advancement and tolerance   Initiate calorie count to assess ability to wean TPN  Re-initiate Magic cup TID with meals    Re-initiate Ensure Plus High Protein po BID when full liquid diet resumed   Resume oral MVI w/ minerals    TPN to meet estimated nutritional needs; TPN order per Pharmacy Daily weights  MVI added to TPN   TPN providing: 1660 kcals and 89g protein (100% of estimated needs)      NUTRITION DIAGNOSIS:  Severe Malnutrition related to acute illness (stercoral colitis w/ ischemia and performation w/ feculent peritonitis) as evidenced by mild fat depletion, moderate fat depletion. - remains applicable   GOAL:  Patient will meet greater than or equal to 90% of their needs - meeting via TPN   MONITOR:  TF tolerance, I & O's, Diet advancement, Labs   Blair Deaner MS, RD, LDN Registered Dietitian Clinical Nutrition RD Inpatient Contact Info in Amion

## 2023-08-31 NOTE — Progress Notes (Signed)
 Referring Physician(s): Dr. Ann   Supervising Physician: Philip Cornet  Patient Status:  Angela [name] - In-pt  Chief Complaint: Intra-abdominal fluid collection s/p drain placements in IR 08/14/23 - RLQ and right TG. RLQ drain removed 7/17. Right TG drain exchanged 08/30/23.   Subjective: Patient sitting on the edge of the bed eating breakfast; husband and sister in the room. She endorses some pain around the drain site but she just had pain medicine and feels ok now.    Allergies: Codeine  Medications: Prior to Admission medications   Medication Sig Start Date End Date Taking? Authorizing Provider  albuterol  (VENTOLIN  HFA) 108 (90 Base) MCG/ACT inhaler Inhale 2 puffs into the lungs every 6 (six) hours as needed for wheezing or shortness of breath. 03/03/22  Yes Kuneff, Renee A, DO  Calcium  Polycarbophil (FIBER-CAPS PO) Take 1 capsule by mouth daily at 12 noon.   Yes [provider]  HYDROcodone -acetaminophen  (NORCO/VICODIN) 5-325 MG tablet Take 1 tablet by mouth every 6 (six) hours as needed for moderate pain (pain score 4-6). 07/29/23  Yes Zackowski, Scott, MD  hydrocortisone  (ANUSOL -HC) 25 MG suppository Place 1 suppository (25 mg total) rectally 2 (two) times daily. 06/23/23  Yes Kuneff, Renee A, DO  hydrocortisone  cream 1 % Apply 1 Application topically 2 (two) times daily. 06/08/23  Yes Kuneff, Renee A, DO  latanoprost  (XALATAN ) 0.005 % ophthalmic solution Place 1 drop into both eyes at bedtime. 08/11/17  Yes [provider]  LINZESS 72 MCG capsule Take 72 mcg by mouth daily before breakfast. 06/26/23  Yes [provider]  lisinopril  (ZESTRIL ) 10 MG tablet TAKE 1 TABLET BY MOUTH DAILY 05/22/23  Yes Kuneff, Renee A, DO  Multiple Vitamin (MULTIVITAMIN) tablet Take 1 tablet by mouth daily.   Yes [provider]  ondansetron  (ZOFRAN -ODT) 4 MG disintegrating tablet Take 1 tablet (4 mg total) by mouth every 8 (eight) hours as needed for nausea or vomiting. 07/29/23  Yes  Zackowski, Scott, MD  Probiotic Product (DAILY PROBIOTIC PO) Take 1 capsule by mouth daily at 12 noon.   Yes [provider]  timolol  (TIMOPTIC ) 0.5 % ophthalmic solution Place 1 drop into both eyes 2 (two) times daily. 11/14/19  Yes [provider]  amoxicillin -clavulanate (AUGMENTIN ) 875-125 MG tablet Take 1 tablet by mouth every 12 (twelve) hours. Patient not taking: Reported on 07/31/2023 07/29/23   Zackowski, Scott, MD  atorvastatin  (LIPITOR) 20 MG tablet Take 1 tablet (20 mg total) by mouth at bedtime. Patient not taking: Reported on 02/22/2023 11/30/21   Catherine Fuller A, DO     Vital Signs: BP (!) 148/78   Pulse 73   Temp 98.2 F (36.8 C) (Oral)   Resp 12   Ht 5' 4 (1.626 m)   Wt 122 lb 12.7 oz (55.7 kg)   SpO2 96%   BMI 21.08 kg/m   Physical Exam Constitutional:      General: She is not in acute distress.    Appearance: She is not ill-appearing.  Cardiovascular:     Rate and Rhythm: Normal rate.  Pulmonary:     Effort: Pulmonary effort is normal.  Abdominal:     Comments: Right transgluteal drain with approximately 20 ml of lightly blood-tinged, purulent fluid in bulb. Drain easily flushed with 5 ml NS. Dressing is clean/dry/intact. Mild tenderness to palpation around the drain site.   Skin:    General: Skin is warm and dry.  Neurological:     Mental Status: She is alert and oriented  to person, place, and time.     Labs:  CBC: Recent Labs    08/28/23 0450 08/29/23 0407 08/30/23 0320 08/31/23 0358  WBC 12.2* 8.2 7.7 7.5  HGB 10.7* 10.4* 10.1* 10.2*  HCT 32.1* 32.0* 31.3* 31.9*  PLT 471* 422* 359 360    COAGS: Recent Labs    07/29/23 0826 08/07/23 0909 08/15/23 0431  INR 1.1 1.2 1.1    BMP: Recent Labs    08/28/23 0450 08/29/23 0407 08/30/23 0320 08/31/23 0358  NA 140 136 138 137  K 3.8 3.8 4.1 4.1  CL 105 105 104 102  CO2 26 25 27 26   GLUCOSE 99 96 108* 108*  BUN 17 19 20 23   CALCIUM  8.2* 8.5* 8.5* 8.4*  CREATININE  0.41* 0.41* 0.49 0.50  GFRNONAA >60 >60 >60 >60    LIVER FUNCTION TESTS: Recent Labs    08/28/23 0450 08/29/23 0407 08/30/23 0320 08/31/23 0358  BILITOT 0.5 0.4 0.4 0.5  AST 14* 12* 12* 15  ALT 12 11 11 11   ALKPHOS 67 65 66 63  PROT 5.1* 5.2* 5.3* 5.2*  ALBUMIN  1.9* 2.0* 2.0* 2.0*    Assessment and Plan:   Intra-abdominal fluid collection s/p drain placements in IR 08/14/23 - RLQ and right TG. RLQ drain removed 7/17. Right TG drain exchanged 08/30/23.   Patient feeling ok today but she does have some tenderness around the drain site. Approximately 20 ml of lightly blood-tinged, purulent fluid in bulb.   Drain Location: Right transgluteal  Size: Fr size: 12 Fr Date of placement: 08/14/23; exchanged 08/30/23 Currently to: Drain collection device: suction bulb 24 hour output:  Output by Drain (mL) 08/29/23 0700 - 08/29/23 1459 08/29/23 1500 - 08/29/23 2259 08/29/23 2300 - 08/30/23 0659 08/30/23 0700 - 08/30/23 1459 08/30/23 1500 - 08/30/23 2259 08/30/23 2300 - 08/31/23 0659 08/31/23 0700 - 08/31/23 1048  Closed System Drain 1 Right Buttock Bulb (JP) 12 Fr.    0 40  5    Interval imaging/drain manipulation:  None  Current examination: Flushes/aspirates easily.  Insertion site unremarkable. Suture and stat lock in place. Dressed appropriately.   Plan: Continue TID flushes with 5 cc NS. Record output Q shift. Dressing changes QD or PRN if soiled.  Call IR APP or on call IR MD if difficulty flushing or sudden change in drain output.  Repeat imaging/possible drain injection once output < 10 mL/QD (excluding flush material). Consideration for drain removal if output is < 10 mL/QD (excluding flush material), pending discussion with the providing surgical service.  Discharge planning: Please contact IR APP or on call IR MD prior to patient d/c to ensure appropriate follow up plans are in place. Typically patient will follow up with IR clinic 10-14 days post d/c for repeat  imaging/possible drain injection. IR scheduler will contact patient with date/time of appointment. Patient will need to flush drain QD with 5 cc NS, record output QD, dressing changes every 2-3 days or earlier if soiled.   IR will continue to follow - please call with questions or concerns.  Electronically Signed: Warren Dais, AGACNP-BC 08/31/2023, 10:07 AM   I spent a total of 15 Minutes at the the patient's bedside AND on the patient's [name] floor or unit, greater than 50% of which was counseling/coordinating care for intra-abdominal fluid collection.

## 2023-08-31 NOTE — Progress Notes (Addendum)
 PHARMACY - TOTAL PARENTERAL NUTRITION CONSULT NOTE  Indication: intolerance to feeding, recurrent ileus  Patient Measurements: Height: 5' 4 (162.6 cm) Weight: 55.7 kg (122 lb 12.7 oz) IBW/kg (Calculated) : 54.7 TPN AdjBW (KG): 61.2 Body mass index is 21.08 kg/m. Usual Weight: ~59 kg  Assessment:  72 YOF s/p exp lap with Hartmann's procedure for stercoral colitis with ischemia and perforation with feculent peritonitis on 6/22. Noted pt has been NPO/clear liquid/full liquid diet since after surgery with limited intake. Pt now with ileus on Xray (dilated loops of small bowel) and N/V with liquid diet so NGT replaced 7/4, now NPO and starting TPN. Pt at risk of refeeding with prolonged period of little PO intake since admission.  Glucose / Insulin : no hx DM - CBGs < 180, off insulin  Electrolytes: K 4.1 (goal >/= 4 for ileus), Mag 2.0(s/p IV Mg 4G x1 7/23)  (goal >/= 2 for ileus), phos 4.5 up, others WNL Renal: SCr < 1, BUN wnl Hepatic: LFTs / tbili WNL, albumin  2.0, TG down at 87 Intake / Output; MIVF: UOP not documented, drain 300 mL, ostomy 25 mL  GI Imaging: 7/5 abd xray - persistent dilated loops of small bowel 7/5 CT - pelvic fluid collection slightly enlarged, suspicious for peritonitis, partially obstructing calculus in L ureter, ileus 7/16 CT small bowel obstruction 7/17 abd XR - SBO 7/19 abd XR - enteric tube in stomach, improving bowel gas pattern, oral contrast in R colon  7/20 Abd xr -  Large bowel oral contrast has continued distally. Mildly dilated left abdominal small bowel loops, but gas pattern still improved GI Surgeries / Procedures:  6/22 ex-lap with Hartmann's procedure for stercoral colitis with ischemia and perf with feculent peritonitis 7/8 CT-guided RLQ abd abscess and transgluteal pelvic drains [>432mL tan colored fluid] 7/10 NGT removed  7/11 NGT replaced  7/16 ureteral stent placement   Central access: PICC placed 08/12/23 TPN start date:  08/12/23  Nutritional Goals: Goal TPN 70 ml/hr will provide 89g AA, 1660 kCal   RD Estimated Needs Total Energy Estimated Needs: 1650-1850 kcals Total Protein Estimated Needs: 80-90 g Total Fluid Estimated Needs: >/= 1.7 L  Current Nutrition:  TPN 7/05 >> - NGT clamp trials started 7/19  CLD 7/20 > 7/22 FLD 7/22 NPO 7/23 for drain repositioning Soft diet 7/24  Plan:   Decrease TPN rate to 35 ml/hr to provide 50% of nutritional needs Electrolytes in TPN: Na 140 mEq/L, K 80 mEq/L, Ca 2 mEq/L, Mg to 15 mEq/L (max), decrease Phos to 5 mmol/L, Cl:Ac 1:1  Add multivitamin trace elements to TPN  Monitor TPN labs on Mon/Thurs - labs in AM  Thank you for allowing pharmacy to be a part of this patient's care.  Shelba Collier, PharmD, BCPS Clinical Pharmacist

## 2023-08-31 NOTE — Progress Notes (Signed)
 Occupational Therapy Treatment Patient Details Name: Angela [name] MRN: [id number] DOB: October 16, 1950 Today's Date: 08/31/2023   History of present illness 73 yo female presents to ED on 6/21 with abdominal pain. Workup for sepsis secondary to bowel perforation. S/p sigmoidectomy with end colostomy 6/22. 6/29 increased WBC and abd pain, s/p CT guided drain placement for pelvic abscess 6/30, repeat drain placement RLQ and R transgluteal pelvic drain 7/7. Pt also with hydroureteronephrosis secondary to L ureteral stone, s/p L ureteral stent placement and L retrograde pyelogram 7/11. PMH includes  HTN, HLD, glaucoma and prior tobacco abuse.   OT comments  Patient received in supine and agreeable to OT treatment. Patient stating pain at new JP drain site and required frequent breaks due to pain during session. Patient continues to make progress, even with pain, requiring supervision for safety to perform self care tasks, functional transfers, and mobility. Discharge recommendations continue to be appropriate. Acute OT to continue to follow to address established goals.       If plan is discharge home, recommend the following:  A little help with bathing/dressing/bathroom;Assistance with cooking/housework   Equipment Recommendations  Tub/shower seat    Recommendations for Other Services      Precautions / Restrictions Precautions Precautions: Fall;Other (comment) Recall of Precautions/Restrictions: Intact Precaution/Restrictions Comments: JP drain and ostomy bag Restrictions Weight Bearing Restrictions Per Provider Order: No       Mobility Bed Mobility Overal bed mobility: Needs Assistance Bed Mobility: Supine to Sit, Sit to Supine     Supine to sit: Modified independent (Device/Increase time) Sit to supine: Min assist   General bed mobility comments: required min assist with BLE to return to supine due to abd pain    Transfers Overall transfer level: Needs assistance Equipment  used: None Transfers: Sit to/from Stand Sit to Stand: Supervision           General transfer comment: no physical assist needed     Balance Overall balance assessment: Needs assistance Sitting-balance support: Feet supported Sitting balance-Leahy Scale: Good     Standing balance support: During functional activity, No upper extremity supported, Single extremity supported Standing balance-Leahy Scale: Fair Standing balance comment: with IV pole and no AD                           ADL either performed or assessed with clinical judgement   ADL Overall ADL's : Needs assistance/impaired     Grooming: Wash/dry hands;Wash/dry face;Oral care;Supervision/safety;Standing Grooming Details (indicate cue type and reason): at sink         Upper Body Dressing : Sitting;Set up Upper Body Dressing Details (indicate cue type and reason): gown for back Lower Body Dressing: Supervision/safety;Sitting/lateral leans Lower Body Dressing Details (indicate cue type and reason): donned socks with figure four Toilet Transfer: Supervision/safety;Regular Teacher, adult education Details (indicate cue type and reason): able to perform toilet transfer without walker Toileting- Clothing Manipulation and Hygiene: Supervision/safety;Sit to/from stand Toileting - Clothing Manipulation Details (indicate cue type and reason): able to manage clothing and perform toilet hygiene     Functional mobility during ADLs: Supervision/safety General ADL Comments: supervision for mobility in room    Extremity/Trunk Assessment Upper Extremity Assessment Upper Extremity Assessment: Overall WFL for tasks assessed            Vision       Perception     Praxis     Communication Communication Communication: No apparent difficulties   Cognition Arousal: Alert  Behavior During Therapy: WFL for tasks assessed/performed Cognition: No apparent impairments                                Following commands: Intact        Cueing   Cueing Techniques: Verbal cues  Exercises      Shoulder Instructions       General Comments VSS on RA    Pertinent Vitals/ Pain       Pain Assessment Pain Assessment: Faces Faces Pain Scale: Hurts little more Pain Location: abdomen, JP drain site Pain Descriptors / Indicators: Discomfort, Guarding, Grimacing Pain Intervention(s): Limited activity within patient's tolerance, Monitored during session, Repositioned  Home Living                                          Prior Functioning/Environment              Frequency  Min 2X/week        Progress Toward Goals  OT Goals(current goals can now be found in the care plan section)  Progress towards OT goals: Progressing toward goals  Acute Rehab OT Goals Patient Stated Goal: to go home OT Goal Formulation: With patient Time For Goal Achievement: 09/05/23 Potential to Achieve Goals: Good ADL Goals Pt Will Perform Grooming: with modified independence Pt Will Perform Lower Body Dressing: with supervision;sit to/from stand Pt Will Transfer to Toilet: with modified independence Pt Will Perform Toileting - Clothing Manipulation and hygiene: with modified independence Pt Will Perform Tub/Shower Transfer: with modified independence  Plan      Co-evaluation                 AM-PAC OT 6 Clicks Daily Activity     Outcome Measure   Help from another person eating meals?: None Help from another person taking care of personal grooming?: A Little Help from another person toileting, which includes using toliet, bedpan, or urinal?: A Little Help from another person bathing (including washing, rinsing, drying)?: A Little Help from another person to put on and taking off regular upper body clothing?: A Little Help from another person to put on and taking off regular lower body clothing?: A Little 6 Click Score: 19    End of Session    OT Visit  Diagnosis: Unsteadiness on feet (R26.81);Other abnormalities of gait and mobility (R26.89);Pain;Muscle weakness (generalized) (M62.81) Pain - part of body:  (abdomen and JP drain site)   Activity Tolerance Patient tolerated treatment well;Patient limited by pain   Patient Left in bed;with call bell/phone within reach;with family/visitor present   Nurse Communication Mobility status        Time: 9252-9190 OT Time Calculation (min): 22 min  Charges: OT General Charges $OT Visit: 1 Visit OT Treatments $Self Care/Home Management : 8-22 mins  Dick Laine, OTA Acute Rehabilitation Services  Office [phone]   Jeb LITTIE Laine 08/31/2023, 10:47 AM

## 2023-09-01 DIAGNOSIS — K631 Perforation of intestine (nontraumatic): Secondary | ICD-10-CM | POA: Diagnosis not present

## 2023-09-01 LAB — CBC WITH DIFFERENTIAL/PLATELET
Abs Immature Granulocytes: 0.03 K/uL (ref 0.00–0.07)
Basophils Absolute: 0.1 K/uL (ref 0.0–0.1)
Basophils Relative: 1 %
Eosinophils Absolute: 0.3 K/uL (ref 0.0–0.5)
Eosinophils Relative: 5 %
HCT: 31.9 % — ABNORMAL LOW (ref 36.0–46.0)
Hemoglobin: 10.3 g/dL — ABNORMAL LOW (ref 12.0–15.0)
Immature Granulocytes: 1 %
Lymphocytes Relative: 28 %
Lymphs Abs: 1.7 K/uL (ref 0.7–4.0)
MCH: 30.1 pg (ref 26.0–34.0)
MCHC: 32.3 g/dL (ref 30.0–36.0)
MCV: 93.3 fL (ref 80.0–100.0)
Monocytes Absolute: 0.6 K/uL (ref 0.1–1.0)
Monocytes Relative: 9 %
Neutro Abs: 3.4 K/uL (ref 1.7–7.7)
Neutrophils Relative %: 56 %
Platelets: 328 K/uL (ref 150–400)
RBC: 3.42 MIL/uL — ABNORMAL LOW (ref 3.87–5.11)
RDW: 17.7 % — ABNORMAL HIGH (ref 11.5–15.5)
WBC: 6.1 K/uL (ref 4.0–10.5)
nRBC: 0 % (ref 0.0–0.2)

## 2023-09-01 LAB — COMPREHENSIVE METABOLIC PANEL WITH GFR
ALT: 12 U/L (ref 0–44)
AST: 14 U/L — ABNORMAL LOW (ref 15–41)
Albumin: 2 g/dL — ABNORMAL LOW (ref 3.5–5.0)
Alkaline Phosphatase: 70 U/L (ref 38–126)
Anion gap: 7 (ref 5–15)
BUN: 20 mg/dL (ref 8–23)
CO2: 27 mmol/L (ref 22–32)
Calcium: 8.6 mg/dL — ABNORMAL LOW (ref 8.9–10.3)
Chloride: 103 mmol/L (ref 98–111)
Creatinine, Ser: 0.55 mg/dL (ref 0.44–1.00)
GFR, Estimated: >60 mL/min (ref >60)
Glucose, Bld: 102 mg/dL — ABNORMAL HIGH (ref 70–99)
Potassium: 3.6 mmol/L (ref 3.5–5.1)
Sodium: 137 mmol/L (ref 135–145)
Total Bilirubin: 0.6 mg/dL (ref 0.0–1.2)
Total Protein: 5.3 g/dL — ABNORMAL LOW (ref 6.5–8.1)

## 2023-09-01 LAB — GLUCOSE, CAPILLARY
Glucose-Capillary: 112 mg/dL — ABNORMAL HIGH (ref 70–99)
Glucose-Capillary: 88 mg/dL (ref 70–99)

## 2023-09-01 LAB — PHOSPHORUS: Phosphorus: 4.2 mg/dL (ref 2.5–4.6)

## 2023-09-01 LAB — MAGNESIUM: Magnesium: 1.8 mg/dL (ref 1.7–2.4)

## 2023-09-01 MED ORDER — SODIUM CHLORIDE 0.9 % IV SOLN
1.0000 g | Freq: Every day | INTRAVENOUS | Status: DC
  Administered 2023-09-01 – 2023-09-04 (×4): 1 g via INTRAVENOUS
  Filled 2023-09-01 (×4): qty 10

## 2023-09-01 MED ORDER — POTASSIUM CHLORIDE 10 MEQ/50ML IV SOLN
10.0000 meq | INTRAVENOUS | Status: AC
  Administered 2023-09-01 (×4): 10 meq via INTRAVENOUS
  Filled 2023-09-01 (×4): qty 50

## 2023-09-01 MED ORDER — MAGNESIUM SULFATE 2 GM/50ML IV SOLN
2.0000 g | Freq: Once | INTRAVENOUS | Status: AC
  Administered 2023-09-01: 2 g via INTRAVENOUS
  Filled 2023-09-01: qty 50

## 2023-09-01 MED ORDER — TRAVASOL 10 % IV SOLN
INTRAVENOUS | Status: AC
  Filled 2023-09-01: qty 445.2

## 2023-09-01 MED ORDER — PANTOPRAZOLE SODIUM 40 MG PO TBEC
40.0000 mg | DELAYED_RELEASE_TABLET | Freq: Two times a day (BID) | ORAL | Status: DC
  Administered 2023-09-01 – 2023-09-04 (×6): 40 mg via ORAL
  Filled 2023-09-01 (×6): qty 1

## 2023-09-01 NOTE — Progress Notes (Addendum)
 PROGRESS NOTE    Angela [name]  FMW:[id number] DOB: [birth year] DOA: 07/30/2023 PCP: Catherine Charlies LABOR, DO  Chief Complaint  Patient presents with   Abdominal Pain   Hematuria   Shortness of Breath    Brief Narrative:   Patient is Angela [name] 73 y.o.  female with history of HTN, HLD who presented with worsening abdominal pain-patient was found to have sigmoid perforation with feculent peritonitis in the setting of stercoral colitis.  Patient underwent sigmoidectomy with end colostomy on 6/22-unfortunately postop course complicated by ileus and pelvic residual abscesses requiring IR drain placement.     Significant events: 6/21>> evaluated in the ED for abdominal pain-found to have left mid ureteral stone-9 mm with hydronephrosis-and large volume of stool-managed with supportive care and discharged home. 6/22>> worsening abdominal pain-repeat CT scan consistent with bowel perforation-surgery consult-admit to [facility]. 6/29>> CT abdomen with pelvic abscess. 6/30>> CT-guided drain and pelvic abscess by IR 7/04>> vomiting-recurrent ileus-NG tube reinserted 7/05>> TNA started-repeat CT abdomen increased size of abscess. 7/07>> IR placed RLQ drain and right transgluteal pelvic drain. 7/10>> NGT removed   Significant studies: 6/22>> CT renal stone study: Interval development of intraperitoneal free air-moderate volume free fluid-consistent with bowel perforation, persistent large volume stool with dilated sigmoid colon.  Similar location of left ureteral stone-9 mm. 6/29>> CT abdomen/pelvis: S/p partial colectomy-LLQ ostomy-complex free fluid with multiple foci of air-most pronounced in the surgical bed-possibility of superimposed infection or bowel leak cannot be excluded. 6/30>> echo: EF 60-65%. 7/05>> CT abdomen/pelvis: Previous collection with pigtail has increased in size, other less well-defined but enlarging peritoneal fluid collections.  Persistent-partially obstructing 11 mm calculus in the distal  left ureter.  Persistent fluid loops of dilated small bowel-likely ileus.   Significant microbiology data: 6/22>> urine culture: No growth 6/22>> blood culture: No growth 6/30>> pelvic abscess culture: Gram-negative rods/gram-positive cocci-mixed anaerobic flora. 7/07>> Pelvic abscess culture:moderate proteus and enterococcus   Procedures: 6/22>> sigmoidectomy with end colostomy.  By general surgeon Dr. Polly 6/30>> CT-guided drain and pelvic abscess by IR 08/19/2023 1.  Cystoscopy with left ureteral stent placement. 2. left retrograde pyelogram with interpretation. 3. Fluoroscopy <1 hour with intraoperative interpretation by urologist Dr. Arlyss Foot   Consults: General Surgery IR Urology  Assessment & Plan:   Principal Problem:   Bowel perforation (HCC) Active Problems:   Hydroureteronephrosis   Chronic idiopathic constipation   Metabolic acidosis with increased anion gap and accumulation of organic acids   Hyponatremia   Essential hypertension   Glaucoma   Perforated sigmoid colon (HCC)   Protein-calorie malnutrition, severe  Sepsis secondary to stercoral colitis with ischemia/perforation of sigmoid colon with feculent peritonitis S/p ex lap sigmoidectomy with end colostomy 6/22  complicated by postoperative ileus and then residual pelvic abscess requiring CT-guided drain placement by IR General surgery and IR following S/p repositioning of slightly displaced percutaneous drainage catheter within the center of the residual fluid collection on 7/23 Surgery recommending soft diet - dressing changes per surgery.   Currently on TPN, will follow how she does with PO - calorie count to plan weaning of TPN, PO intake sounds inconsistent Abscess RLQ growing proteus mirabilis, enterococcus faecalis, bacteroides thetaiotaomicron, beta lactamase positive Abx have been completed as of 7/20, appreciate surgery recs   Hydronephrosis secondary to distal left ureteral stone Urology  following-with recommendations for supportive care for now-now that her medical problems have more or less stabilized underwent left ureteral stent placement by urology Dr. Arlyss Foot on 08/18/2023 Plan for follow up after  discharge, they're going to work on follow up (7/24)   UTI Awaiting final culture results Will start treatment with ceftriaxone  Hospital delirium Secondary to acute illness/narcotics Now resolved   Normocytic anemia Due to acute illness,+/- coffee-ground emesis on 7/7 S/p 2 units pRBC 7/20 Hb remains stable   Thrombocytosis Likely reactive Supportive care   Coffee-ground NG tube emesis Seems to have resolved Continue PPI  Hypomagnesemia/hypophosphatemia/hypokalemia Secondary to NG tube suctioning Resolved.   HTN Holding home BP meds for now Will monitor    Lower extremity edema Secondary to hypoalbuminemia/IV fluid resuscitation in the setting of acute illness Echo with preserved EF-UA negative for proteinuria Consider diuretics when more stable   Chronic idiopathic constipation Bowel function recovering-brown stools - good output for now   Odynophagia secondary to oral/aphthous ulcers Improved on Valtrex and Magic mouthwash Supportive care   Glaucoma Timolol  eyedrops   Insomnia Appears to be primarily due to pain-anxiety Schedule trazodone  at night  Debility/deconditioning PT OT eval-Home health recommended.     DVT prophylaxis: lovenox  Code Status: full Family Communication: none Disposition:   Status is: Inpatient Remains inpatient appropriate because: need for continued inpatient care   Consultants:  IR surgery  Procedures:   6/22  Sigmoidectomy with end colostomy   6/30 CT guided drain placement   7/7 CT guided placement of RLQ drain and right transgluteal pelvic drain   7/11 1.  Cystoscopy with left ureteral stent placement 2. left retrograde pyelogram with interpretation 3. Fluoroscopy <1 hour with  intraoperative interpretation    Antimicrobials:  Anti-infectives (From admission, onward)    Start     Dose/Rate Route Frequency Ordered Stop   09/01/23 0930  cefTRIAXone (ROCEPHIN) 1 g in sodium chloride  0.9 % 100 mL IVPB        1 g 200 mL/hr over 30 Minutes Intravenous Daily 09/01/23 0816     08/31/23 1115  fluconazole  (DIFLUCAN ) tablet 150 mg        150 mg Oral  Once 08/31/23 1029 08/31/23 1250   08/18/23 1800  Ampicillin -Sulbactam (UNASYN ) 3 g in sodium chloride  0.9 % 100 mL IVPB  Status:  Discontinued        3 g 200 mL/hr over 30 Minutes Intravenous Every 6 hours 08/18/23 0922 08/27/23 1241   08/06/23 1130  acyclovir  (ZOVIRAX ) 200 MG capsule 400 mg  Status:  Discontinued        400 mg Oral 3 times daily 08/06/23 1037 08/08/23 1424   08/06/23 1100  piperacillin -tazobactam (ZOSYN ) IVPB 3.375 g        3.375 g 12.5 mL/hr over 240 Minutes Intravenous Every 8 hours 08/06/23 1051 08/18/23 1259   07/30/23 1800  piperacillin -tazobactam (ZOSYN ) IVPB 3.375 g        3.375 g 12.5 mL/hr over 240 Minutes Intravenous Every 8 hours 07/30/23 1332 08/04/23 2359   07/30/23 1400  piperacillin -tazobactam (ZOSYN ) IVPB 3.375 g  Status:  Discontinued        3.375 g 100 mL/hr over 30 Minutes Intravenous Every 8 hours 07/30/23 1329 07/30/23 1332   07/30/23 1015  piperacillin -tazobactam (ZOSYN ) IVPB 3.375 g        3.375 g 100 mL/hr over 30 Minutes Intravenous  Once 07/30/23 1013 07/30/23 1311       Subjective: Didn't eat much yesterday, but she's trying Still with dysuria  Objective: Vitals:   08/31/23 2004 08/31/23 2308 09/01/23 0421 09/01/23 0852  BP: (!) 140/69 (!) 142/85 (!) 148/86 118/81  Pulse:   65 71  Resp: 11 17 15 16   Temp: 98.4 F (36.9 C) 98.3 F (36.8 C) 98.1 F (36.7 C)   TempSrc: Oral Oral Oral   SpO2:   94%   Weight:      Height:        Intake/Output Summary (Last 24 hours) at 09/01/2023 0908 Last data filed at 09/01/2023 0600 Gross per 24 hour  Intake --  Output 120  ml  Net -120 ml   Filed Weights   08/26/23 0500 08/27/23 0500 08/31/23 0522  Weight: 58.1 kg 59.1 kg 55.7 kg    Examination:  General: No acute distress. Cardiovascular:RRR Lungs: unlabored Abdomen: midline dressing, appropriately ttp JP drain with purulent output Neurological: Alert and oriented 3. Moves all extremities 4 . Cranial nerves II through XII grossly intact. Extremities: No clubbing or cyanosis. No edema.   Data Reviewed: I have personally reviewed following labs and imaging studies  CBC: Recent Labs  Lab 08/28/23 0450 08/29/23 0407 08/30/23 0320 08/31/23 0358 09/01/23 0324  WBC 12.2* 8.2 7.7 7.5 6.1  NEUTROABS 9.9* 5.5 5.1 5.1 3.4  HGB 10.7* 10.4* 10.1* 10.2* 10.3*  HCT 32.1* 32.0* 31.3* 31.9* 31.9*  MCV 90.2 90.7 91.5 92.2 93.3  PLT 471* 422* 359 360 328    Basic Metabolic Panel: Recent Labs  Lab 08/27/23 0405 08/28/23 0450 08/29/23 0407 08/30/23 0320 08/31/23 0358 09/01/23 0324  NA 139 140 136 138 137 137  K 3.8 3.8 3.8 4.1 4.1 3.6  CL 105 105 105 104 102 103  CO2 27 26 25 27 26 27   GLUCOSE 100* 99 96 108* 108* 102*  BUN 17 17 19 20 23 20   CREATININE 0.46 0.41* 0.41* 0.49 0.50 0.55  CALCIUM  8.0* 8.2* 8.5* 8.5* 8.4* 8.6*  MG 1.9  --  1.7 1.7 2.0 1.8  PHOS 3.4  --  3.8 4.2 4.5 4.2    GFR: Estimated Creatinine Clearance: 56.5 mL/min (by C-G formula based on SCr of 0.55 mg/dL).  Liver Function Tests: Recent Labs  Lab 08/28/23 0450 08/29/23 0407 08/30/23 0320 08/31/23 0358 09/01/23 0324  AST 14* 12* 12* 15 14*  ALT 12 11 11 11 12   ALKPHOS 67 65 66 63 70  BILITOT 0.5 0.4 0.4 0.5 0.6  PROT 5.1* 5.2* 5.3* 5.2* 5.3*  ALBUMIN  1.9* 2.0* 2.0* 2.0* 2.0*    CBG: Recent Labs  Lab 08/31/23 0519 08/31/23 1141 08/31/23 1547 08/31/23 2307 09/01/23 0419  GLUCAP 96 100* 83 107* 112*     No results found for this or any previous visit (from the past 240 hours).       Radiology Studies: IR Catheter Tube Change Result Date:  08/30/2023 INDICATION: Clinical concern for for cutaneous drainage catheter mild dysfunction/malposition EXAM: IR CATHETER TUBE CHANGE MEDICATIONS: The patient is currently admitted to the hospital and receiving intravenous antibiotics. The antibiotics were administered within an appropriate time frame prior to the initiation of the procedure. ANESTHESIA/SEDATION: Fentanyl  1 mcg IV; Versed  50 mg IV Moderate Sedation Time:  15 The patient was continuously monitored during the procedure by the interventional radiology nurse under my direct supervision. COMPLICATIONS: None immediate. PROCEDURE: Informed written consent was obtained from the patient after Colston Pyle thorough discussion of the procedural risks, benefits and alternatives. All questions were addressed. Maximal Sterile Barrier Technique was utilized including caps, mask, sterile gowns, sterile gloves, sterile drape, hand hygiene and skin antiseptic. Jhalil Silvera timeout was performed prior to the initiation of the procedure. The drain site was prepped and draped in usual sterile  fashion. Deborra Phegley gentle hand injection of contrast agent was performed which demonstrated filling of Texie Tupou residual cavity just adjacent to the percutaneous drainage catheter. Zakeria Kulzer definite fistula was not seen. Next, the existing drainage catheter was removed over Merrel Crabbe guidewire. Sears Oran new 10 French percutaneous drain was placed more cranially into the central portion of the residual collection. This was irrigated and an additional 20 cc of material were collected. The tube was then secured in place using graded suture material and placed to bulb suction drainage. IMPRESSION: 1. Repositioning of the slightly displaced percutaneous drainage catheter within the center of the residual fluid collection. 2. No definite fistula identified. 3. Continue percutaneous catheter drainage and routine drain management. Electronically Signed   By: Maude Naegeli M.D.   On: 08/30/2023 11:29        Scheduled Meds:  sodium chloride     Intravenous Once   acetaminophen   1,000 mg Oral Q6H   bisacodyl   10 mg Rectal Daily   carvedilol   6.25 mg Oral BID WC   Chlorhexidine  Gluconate Cloth  6 each Topical Daily   enoxaparin  (LOVENOX ) injection  40 mg Subcutaneous Q24H   feeding supplement  237 mL Oral BID BM   ferrous sulfate   325 mg Oral BID WC   folic acid   1 mg Oral Daily   latanoprost   1 drop Both Eyes QHS   methocarbamol   500 mg Oral TID   Or   methocarbamol  (ROBAXIN ) injection  500 mg Intravenous TID   pantoprazole  (PROTONIX ) IV  40 mg Intravenous Q12H   polyethylene glycol  17 g Oral BID   timolol   1 drop Both Eyes BID   traZODone   50 mg Oral QHS   Continuous Infusions:  cefTRIAXone (ROCEPHIN)  IV     TPN ADULT (ION) 35 mL/hr at 08/31/23 1801     LOS: 33 days    Time spent: over 30 min    Meliton Monte, MD [facility]   To contact the attending provider between 7A-7P or the covering provider during after hours 7P-7A, please log into the web site [email or web address] and access using universal [place] password for that web site. If you do not have the password, please call the hospital operator.  09/01/2023, 9:08 AM

## 2023-09-01 NOTE — Plan of Care (Signed)
  Problem: Education: Goal: Knowledge of General Education information will improve Description: Including pain rating scale, medication(s)/side effects and non-pharmacologic comfort measures Outcome: Progressing   Problem: Health Behavior/Discharge Planning: Goal: Ability to manage health-related needs will improve Outcome: Progressing   Problem: Clinical Measurements: Goal: Will remain free from infection Outcome: Progressing Goal: Diagnostic test results will improve Outcome: Progressing   Problem: Activity: Goal: Risk for activity intolerance will decrease Outcome: Progressing   Problem: Nutrition: Goal: Adequate nutrition will be maintained Outcome: Progressing   Problem: Coping: Goal: Level of anxiety will decrease Outcome: Progressing   Problem: Pain Managment: Goal: General experience of comfort will improve and/or be controlled Outcome: Progressing   Problem: Skin Integrity: Goal: Risk for impaired skin integrity will decrease Outcome: Progressing

## 2023-09-01 NOTE — Progress Notes (Signed)
 PT Cancellation Note  Patient Details Name: Angela [name] MRN: [id number] DOB: October 31, 1950   Cancelled Treatment:    Reason Eval/Treat Not Completed: Patient declined, no reason specified (Pt politely declined requesting PT to come back in the afternoon.)   Amzie Sillas 09/01/2023, 9:19 AM

## 2023-09-01 NOTE — Progress Notes (Signed)
 Physical Therapy Treatment Patient Details Name: Angela [name] MRN: [id number] DOB: 04/27/1950 Today's Date: 09/01/2023   History of Present Illness 73 yo female presents to ED on 6/21 with abdominal pain. Workup for sepsis secondary to bowel perforation. S/p sigmoidectomy with end colostomy 6/22. 6/29 increased WBC and abd pain, s/p CT guided drain placement for pelvic abscess 6/30, repeat drain placement RLQ and R transgluteal pelvic drain 7/7. Pt also with hydroureteronephrosis secondary to L ureteral stone, s/p L ureteral stent placement and L retrograde pyelogram 7/11. PMH includes  HTN, HLD, glaucoma and prior tobacco abuse.    PT Comments  Pt tolerated treatment well today. Pt with similar presentation to previous session. No change in DC/DME recs at this time. PT will continue to follow.     If plan is discharge home, recommend the following: A little help with walking and/or transfers;A little help with bathing/dressing/bathroom;Assistance with cooking/housework;Direct supervision/assist for medications management;Assist for transportation;Help with stairs or ramp for entrance   Can travel by private vehicle        Equipment Recommendations  Rolling walker (2 wheels)    Recommendations for Other Services       Precautions / Restrictions Precautions Precautions: Fall;Other (comment) Recall of Precautions/Restrictions: Intact Precaution/Restrictions Comments: JP drain and ostomy bag Restrictions Weight Bearing Restrictions Per Provider Order: No     Mobility  Bed Mobility Overal bed mobility: Modified Independent Bed Mobility: Supine to Sit     Supine to sit: Modified independent (Device/Increase time)     General bed mobility comments: Able to sit EOB on her own. Family assisted pt back to bed.    Transfers Overall transfer level: Needs assistance Equipment used: None Transfers: Sit to/from Stand Sit to Stand: Supervision           General transfer  comment: no physical assist needed    Ambulation/Gait Ambulation/Gait assistance: Supervision Gait Distance (Feet): 500 Feet Assistive device: None, IV Pole Gait Pattern/deviations: Decreased stride length, Step-through pattern, Trunk flexed Gait velocity: decreased     General Gait Details: slow, guarded gait with UE support on IV pole initially progressing to no UE support. no LOB   Stairs             Wheelchair Mobility     Tilt Bed    Modified Rankin (Stroke Patients Only)       Balance Overall balance assessment: Needs assistance Sitting-balance support: Feet supported Sitting balance-Leahy Scale: Good     Standing balance support: During functional activity, No upper extremity supported, Single extremity supported Standing balance-Leahy Scale: Fair Standing balance comment: with IV pole and no AD                            Communication Communication Communication: No apparent difficulties  Cognition Arousal: Alert Behavior During Therapy: WFL for tasks assessed/performed   PT - Cognitive impairments: No apparent impairments                         Following commands: Intact      Cueing Cueing Techniques: Verbal cues  Exercises      General Comments General comments (skin integrity, edema, etc.): VSS      Pertinent Vitals/Pain Pain Assessment Pain Assessment: Faces Faces Pain Scale: Hurts a little bit Pain Location: abdomen, JP drain site Pain Descriptors / Indicators: Discomfort, Guarding, Grimacing Pain Intervention(s): Monitored during session    Home Living  Prior Function            PT Goals (current goals can now be found in the care plan section) Acute Rehab PT Goals Patient Stated Goal: to get better PT Goal Formulation: With patient Time For Goal Achievement: 09/04/23 Progress towards PT goals: Progressing toward goals    Frequency    Min 3X/week      PT  Plan      Co-evaluation              AM-PAC PT 6 Clicks Mobility   Outcome Measure  Help needed turning from your back to your side while in a flat bed without using bedrails?: None Help needed moving from lying on your back to sitting on the side of a flat bed without using bedrails?: A Little Help needed moving to and from a bed to a chair (including a wheelchair)?: A Little Help needed standing up from a chair using your arms (e.g., wheelchair or bedside chair)?: A Little Help needed to walk in hospital room?: A Little Help needed climbing 3-5 steps with a railing? : A Little 6 Click Score: 19    End of Session Equipment Utilized During Treatment: Gait belt Activity Tolerance: Patient tolerated treatment well Patient left: with call bell/phone within reach;with family/visitor present;in bed;with nursing/sitter in room Nurse Communication: Mobility status PT Visit Diagnosis: Other abnormalities of gait and mobility (R26.89);Pain     Time: 1413-1430 PT Time Calculation (min) (ACUTE ONLY): 17 min  Charges:    $Gait Training: 8-22 mins PT General Charges $$ ACUTE PT VISIT: 1 Visit                     Sueellen NOVAK, PT, DPT Acute Rehab Services [phone]    Kelby Lotspeich 09/01/2023, 3:07 PM

## 2023-09-01 NOTE — Progress Notes (Signed)
 PHARMACY - TOTAL PARENTERAL NUTRITION CONSULT NOTE  Indication: intolerance to feeding, recurrent ileus  Patient Measurements: Height: 5' 4 (162.6 cm) Weight: 55.7 kg (122 lb 12.7 oz) IBW/kg (Calculated) : 54.7 TPN AdjBW (KG): 61.2 Body mass index is 21.08 kg/m. Usual Weight: ~59 kg  Assessment:  72 YOF s/p exp lap with Hartmann's procedure for stercoral colitis with ischemia and perforation with feculent peritonitis on 6/22. Noted pt has been NPO/clear liquid/full liquid diet since after surgery with limited intake. Pt now with ileus on Xray (dilated loops of small bowel) and N/V with liquid diet so NGT replaced 7/4, now NPO and starting TPN. Pt at risk of refeeding with prolonged period of little PO intake since admission.  Glucose / Insulin : no hx DM - CBGs < 180, off insulin  Electrolytes: K 3.6 (goal >/= 4 for ileus), Mag 1.8 (goal >/= 2 for ileus), others WNL Renal: SCr < 1, BUN WNL Hepatic: LFTs / tbili WNL, albumin  2, TG down at 87 Intake / Output; MIVF: UOP765mL, drain 5mL, ostomy  GI Imaging: 7/5 abd xray - persistent dilated loops of small bowel 7/5 CT - pelvic fluid collection slightly enlarged, suspicious for peritonitis, partially obstructing calculus in L ureter, ileus 7/16 CT small bowel obstruction 7/17 abd XR - SBO 7/19 abd XR - enteric tube in stomach, improving bowel gas pattern, oral contrast in R colon  7/20 Abd XR -  Large bowel oral contrast, gas pattern improved GI Surgeries / Procedures:  6/22 ex-lap with Hartmann's procedure for stercoral colitis with ischemia and perf with feculent peritonitis 7/8 CT-guided RLQ abd abscess and transgluteal pelvic drains [>43mL tan colored fluid] 7/10 NGT removed  7/11 NGT replaced  7/16 ureteral stent placement   Central access: PICC placed 08/12/23 TPN start date: 08/12/23  Nutritional Goals: Goal TPN 70 ml/hr will provide 89g AA, 1660 kCal   RD Estimated Needs Total Energy Estimated Needs: 1650-1850  kcals Total Protein Estimated Needs: 80-90 g Total Fluid Estimated Needs: >/= 1.7 L  Current Nutrition:  TPN - consume <25% of meals, no appetite Soft diet 7/24 Ensure Plus High Protein BID - 1 charted given on 7/24  Plan:   Continue TPN at 35 ml/hr to provide 50% of nutritional needs Electrolytes in TPN: Na 150 mEq/L, K 80 mEq/L (max), Ca 2 mEq/L, Mg 15 mEq/L (max), decrease Phos to 5 mmol/L on 7/24, Cl:Ac 1:1  Add multivitamin trace elements to TPN  Mag sulfate 2gm IV x 1 KCL x 4 runs Monitor TPN labs on Mon/Thurs - labs in AM  Kennita Pavlovich D. [place], PharmD, BCPS, BCCCP 09/01/2023, 9:46 AM

## 2023-09-01 NOTE — Progress Notes (Signed)
 14 Days Post-Op  Subjective: CC: Patient has no nausea/vomiting but states she is not eating much. She has a fair amount of stool in bag with some gas. She denies abdominal pain. Tenderness around TG drain has improved significantly per patient.   Objective: Vital signs in last 24 hours: Temp:  [97.7 F (36.5 C)-98.4 F (36.9 C)] 98.1 F (36.7 C) (07/25 0421) Pulse Rate:  [65] 65 (07/25 0421) Resp:  [11-21] 15 (07/25 0421) BP: (130-148)/(69-103) 148/86 (07/25 0421) SpO2:  [94 %-96 %] 94 % (07/25 0421) Last BM Date : 08/31/23  Intake/Output from previous day: 07/24 0701 - 07/25 0700 In: -  Out: 120 [Drains:20; Stool:100] Intake/Output this shift: No intake/output data recorded.  PE: General appearance: alert and cooperative GI: Soft, LLQ ttp, no rigidity or guarding. Ostomy viable. Midline wound remains overall clean with mostly granulation tissue and some fibrinous tissue at the base, 1.5cm are of tracking at the most inferior aspect, no obvious separation of fascia, no drainage. JP RLQ - feculent,  good ostomy output- brown, w/o evidence of bleeding.     Lab Results:  Recent Labs    08/31/23 0358 09/01/23 0324  WBC 7.5 6.1  HGB 10.2* 10.3*  HCT 31.9* 31.9*  PLT 360 328   BMET Recent Labs    08/31/23 0358 09/01/23 0324  NA 137 137  K 4.1 3.6  CL 102 103  CO2 26 27  GLUCOSE 108* 102*  BUN 23 20  CREATININE 0.50 0.55  CALCIUM  8.4* 8.6*   PT/INR No results for input(s): LABPROT, INR in the last 72 hours. CMP     Component Value Date/Time   NA 137 09/01/2023 0324   NA 140 11/10/2007 0000   K 3.6 09/01/2023 0324   CL 103 09/01/2023 0324   CO2 27 09/01/2023 0324   GLUCOSE 102 (H) 09/01/2023 0324   BUN 20 09/01/2023 0324   BUN 10 11/10/2007 0000   CREATININE 0.55 09/01/2023 0324   CREATININE 0.74 06/26/2020 1522   CALCIUM  8.6 (L) 09/01/2023 0324   PROT 5.3 (L) 09/01/2023 0324   ALBUMIN  2.0 (L) 09/01/2023 0324   AST 14 (L) 09/01/2023 0324    ALT 12 09/01/2023 0324   ALKPHOS 70 09/01/2023 0324   BILITOT 0.6 09/01/2023 0324   GFRNONAA >60 09/01/2023 0324   Lipase     Component Value Date/Time   LIPASE <10 (L) 07/30/2023 0936    Studies/Results: IR Catheter Tube Change Result Date: 08/30/2023 INDICATION: Clinical concern for for cutaneous drainage catheter mild dysfunction/malposition EXAM: IR CATHETER TUBE CHANGE MEDICATIONS: The patient is currently admitted to the hospital and receiving intravenous antibiotics. The antibiotics were administered within an appropriate time frame prior to the initiation of the procedure. ANESTHESIA/SEDATION: Fentanyl  1 mcg IV; Versed  50 mg IV Moderate Sedation Time:  15 The patient was continuously monitored during the procedure by the interventional radiology nurse under my direct supervision. COMPLICATIONS: None immediate. PROCEDURE: Informed written consent was obtained from the patient after a thorough discussion of the procedural risks, benefits and alternatives. All questions were addressed. Maximal Sterile Barrier Technique was utilized including caps, mask, sterile gowns, sterile gloves, sterile drape, hand hygiene and skin antiseptic. A timeout was performed prior to the initiation of the procedure. The drain site was prepped and draped in usual sterile fashion. A gentle hand injection of contrast agent was performed which demonstrated filling of a residual cavity just adjacent to the percutaneous drainage catheter. A definite fistula was not seen. Next,  the existing drainage catheter was removed over a guidewire. A new 10 French percutaneous drain was placed more cranially into the central portion of the residual collection. This was irrigated and an additional 20 cc of material were collected. The tube was then secured in place using graded suture material and placed to bulb suction drainage. IMPRESSION: 1. Repositioning of the slightly displaced percutaneous drainage catheter within the center of  the residual fluid collection. 2. No definite fistula identified. 3. Continue percutaneous catheter drainage and routine drain management. Electronically Signed   By: Maude Naegeli M.D.   On: 08/30/2023 11:29    Anti-infectives: Anti-infectives (From admission, onward)    Start     Dose/Rate Route Frequency Ordered Stop   09/01/23 0930  cefTRIAXone (ROCEPHIN) 1 g in sodium chloride  0.9 % 100 mL IVPB        1 g 200 mL/hr over 30 Minutes Intravenous Daily 09/01/23 0816     08/31/23 1115  fluconazole  (DIFLUCAN ) tablet 150 mg        150 mg Oral  Once 08/31/23 1029 08/31/23 1250   08/18/23 1800  Ampicillin -Sulbactam (UNASYN ) 3 g in sodium chloride  0.9 % 100 mL IVPB  Status:  Discontinued        3 g 200 mL/hr over 30 Minutes Intravenous Every 6 hours 08/18/23 0922 08/27/23 1241   08/06/23 1130  acyclovir  (ZOVIRAX ) 200 MG capsule 400 mg  Status:  Discontinued        400 mg Oral 3 times daily 08/06/23 1037 08/08/23 1424   08/06/23 1100  piperacillin -tazobactam (ZOSYN ) IVPB 3.375 g        3.375 g 12.5 mL/hr over 240 Minutes Intravenous Every 8 hours 08/06/23 1051 08/18/23 1259   07/30/23 1800  piperacillin -tazobactam (ZOSYN ) IVPB 3.375 g        3.375 g 12.5 mL/hr over 240 Minutes Intravenous Every 8 hours 07/30/23 1332 08/04/23 2359   07/30/23 1400  piperacillin -tazobactam (ZOSYN ) IVPB 3.375 g  Status:  Discontinued        3.375 g 100 mL/hr over 30 Minutes Intravenous Every 8 hours 07/30/23 1329 07/30/23 1332   07/30/23 1015  piperacillin -tazobactam (ZOSYN ) IVPB 3.375 g        3.375 g 100 mL/hr over 30 Minutes Intravenous  Once 07/30/23 1013 07/30/23 1311        Assessment/Plan POD 14 - S/p ex lap with Hartmann's procedure for stercoral colitis with ischemia and perforation with feculent peritonitis, Dr. Polly 6/22   -CT scan 7/22 showed Right transgluteal pigtail drainage catheter with tip in the posterior pelvis. A 5.3 x 2.9 cm loculated collection just superior to the pigtail tip of  the catheter in the posterior pelvis, relatively similar to prior CT. - CT scan 7/17 shows dilated stomach and SBO, some improvement in collections  - 7/17 RLQ drain removed, R transgluteal drain left in place with sanguinous output. - Continue BID dressing changes to midline - Dakins added 7/19 for 3d for exam finding of pseudomonas colonization. Now appears to be clearing up.  - WOCN following for ostomy care.  - Mobilize, PT - recommending HH - Pulm toilet  -IR drain repositioned 7/23   FEN: Soft diet, plan to wean TPN, however, patient is not tolerating much PO intake.  VTE: Lovenox , SCDs ID: Rocephin for UTI. Afebrile. WBC on 7/24 down to 6.1 from 7.5    UTI- on Rocephin per medicine team.  Glaucoma- on Timolol  Hyperlipidemia HTN    LOS: 33 days  Angela [name], [facility] Surgery 09/01/2023, 8:27 AM Please see Amion for pager number during day hours 7:00am-4:30pm

## 2023-09-01 NOTE — TOC Progression Note (Addendum)
 Transition of Care [facility]) - Progression Note    Patient Details  Name: Angela [name] MRN: [id number] Date of Birth: [birth year]  Transition of Care [facility]) CM/SW Contact  Corean JAYSON Canary, RN Phone Number: 09/01/2023, 2:01 PM  Clinical Narrative:     Checked with Mitch regarding status of DME. Hospital bed, RW and [facility] were ordered on 7/23  Will continue to follow   Mitch from [employer] notified me to say that all the DME is there, however the MD has not signed the orders or narrative. Spoke to Dr Perri about countersigning orders and narratives                 Expected Discharge Plan and Services                                               Social Drivers of Health (SDOH) Interventions SDOH Screenings   Food Insecurity: Patient Declined (08/02/2023)  Housing: Unknown (08/02/2023)  Transportation Needs: Patient Declined (08/02/2023)  Utilities: Patient Declined (08/02/2023)  Alcohol Screen: Low Risk  (04/19/2023)  Depression (PHQ2-9): Low Risk  (06/23/2023)  Financial Resource Strain: Patient Declined (04/19/2023)  Physical Activity: Sufficiently Active (04/19/2023)  Social Connections: Patient Declined (08/02/2023)  Stress: No Stress Concern Present (04/19/2023)  Tobacco Use: Medium Risk (07/30/2023)  Health Literacy: Adequate Health Literacy (04/19/2023)    Readmission Risk Interventions     No data to display

## 2023-09-02 LAB — MAGNESIUM: Magnesium: 1.7 mg/dL (ref 1.7–2.4)

## 2023-09-02 LAB — CBC WITH DIFFERENTIAL/PLATELET
Abs Immature Granulocytes: 0.03 K/uL (ref 0.00–0.07)
Basophils Absolute: 0 K/uL (ref 0.0–0.1)
Basophils Relative: 1 %
Eosinophils Absolute: 0.3 K/uL (ref 0.0–0.5)
Eosinophils Relative: 5 %
HCT: 31.1 % — ABNORMAL LOW (ref 36.0–46.0)
Hemoglobin: 9.9 g/dL — ABNORMAL LOW (ref 12.0–15.0)
Immature Granulocytes: 1 %
Lymphocytes Relative: 33 %
Lymphs Abs: 1.8 K/uL (ref 0.7–4.0)
MCH: 29.6 pg (ref 26.0–34.0)
MCHC: 31.8 g/dL (ref 30.0–36.0)
MCV: 92.8 fL (ref 80.0–100.0)
Monocytes Absolute: 0.6 K/uL (ref 0.1–1.0)
Monocytes Relative: 11 %
Neutro Abs: 2.6 K/uL (ref 1.7–7.7)
Neutrophils Relative %: 49 %
Platelets: 305 K/uL (ref 150–400)
RBC: 3.35 MIL/uL — ABNORMAL LOW (ref 3.87–5.11)
RDW: 17.7 % — ABNORMAL HIGH (ref 11.5–15.5)
WBC: 5.3 K/uL (ref 4.0–10.5)
nRBC: 0 % (ref 0.0–0.2)

## 2023-09-02 LAB — COMPREHENSIVE METABOLIC PANEL WITH GFR
ALT: 11 U/L (ref 0–44)
AST: 14 U/L — ABNORMAL LOW (ref 15–41)
Albumin: 2.1 g/dL — ABNORMAL LOW (ref 3.5–5.0)
Alkaline Phosphatase: 59 U/L (ref 38–126)
Anion gap: 9 (ref 5–15)
BUN: 16 mg/dL (ref 8–23)
CO2: 25 mmol/L (ref 22–32)
Calcium: 8.4 mg/dL — ABNORMAL LOW (ref 8.9–10.3)
Chloride: 102 mmol/L (ref 98–111)
Creatinine, Ser: 0.55 mg/dL (ref 0.44–1.00)
GFR, Estimated: >60 mL/min (ref >60)
Glucose, Bld: 94 mg/dL (ref 70–99)
Potassium: 3.9 mmol/L (ref 3.5–5.1)
Sodium: 136 mmol/L (ref 135–145)
Total Bilirubin: 0.3 mg/dL (ref 0.0–1.2)
Total Protein: 5.1 g/dL — ABNORMAL LOW (ref 6.5–8.1)

## 2023-09-02 LAB — PHOSPHORUS: Phosphorus: 3.5 mg/dL (ref 2.5–4.6)

## 2023-09-02 LAB — GLUCOSE, CAPILLARY: Glucose-Capillary: 97 mg/dL (ref 70–99)

## 2023-09-02 MED ORDER — POTASSIUM CHLORIDE 10 MEQ/50ML IV SOLN
10.0000 meq | INTRAVENOUS | Status: AC
  Administered 2023-09-02 (×2): 10 meq via INTRAVENOUS
  Filled 2023-09-02 (×2): qty 50

## 2023-09-02 MED ORDER — MAGNESIUM SULFATE 4 GM/100ML IV SOLN
4.0000 g | Freq: Once | INTRAVENOUS | Status: AC
  Administered 2023-09-02: 4 g via INTRAVENOUS
  Filled 2023-09-02: qty 100

## 2023-09-02 MED ORDER — TRAVASOL 10 % IV SOLN
INTRAVENOUS | Status: AC
  Filled 2023-09-02: qty 445.2

## 2023-09-02 NOTE — Progress Notes (Signed)
    Assessment & Plan: POD#34 - S/p ex lap with Hartmann's procedure for stercoral colitis with ischemia and perforation with feculent peritonitis, Dr. Polly 6/22  - CT scan 7/22 right transgluteal pigtail - IR repositioned 7/23 - Continue BID dressing changes to midline - WOCN following for ostomy care.  - Mobilize, PT - recommending HH - Pulm toilet    FEN: Soft diet, wean TPN VTE: Lovenox , SCDs ID: Rocephin  for UTI. Afebrile   Better appetite this AM, ambulating.  Continue TNA for now, may decrease rate tomorrow if continued improvement in po intake.        [name], MD [facility] Surgery A [facility] practice Office: [phone]        Chief Complaint: Colonic perforation  Subjective: Patient in bed, family at bedside.  Appetite better this AM.  Ambulated.  Objective: Vital signs in last 24 hours: Temp:  [97.5 F (36.4 C)-98.3 F (36.8 C)] 97.9 F (36.6 C) (07/26 0434) Pulse Rate:  [69-73] 73 (07/26 0434) Resp:  [16-22] 20 (07/26 0434) BP: (128-162)/(71-83) 162/80 (07/26 0434) SpO2:  [94 %-98 %] 97 % (07/26 0434) Weight:  [56.6 kg] 56.6 kg (07/26 0500) Last BM Date : 09/01/23  Intake/Output from previous day: 07/25 0701 - 07/26 0700 In: 634.1 [P.O.:180; I.V.:454.1] Out: 810 [Urine:700; Drains:10; Stool:100] Intake/Output this shift: No intake/output data recorded.  Physical Exam: HEENT - sclerae clear, mucous membranes moist Neck - soft Abdomen - soft, dressing intact; output in ostomy bag; JP with cloudy tan output  Lab Results:  Recent Labs    09/01/23 0324 09/02/23 0350  WBC 6.1 5.3  HGB 10.3* 9.9*  HCT 31.9* 31.1*  PLT 328 305   BMET Recent Labs    09/01/23 0324 09/02/23 0350  NA 137 136  K 3.6 3.9  CL 103 102  CO2 27 25  GLUCOSE 102* 94  BUN 20 16  CREATININE 0.55 0.55  CALCIUM  8.6* 8.4*   PT/INR No results for input(s): LABPROT, INR in the last 72 hours. Comprehensive Metabolic Panel:    Component Value  Date/Time   NA 136 09/02/2023 0350   NA 137 09/01/2023 0324   NA 140 11/10/2007 0000   K 3.9 09/02/2023 0350   K 3.6 09/01/2023 0324   CL 102 09/02/2023 0350   CL 103 09/01/2023 0324   CO2 25 09/02/2023 0350   CO2 27 09/01/2023 0324   BUN 16 09/02/2023 0350   BUN 20 09/01/2023 0324   BUN 10 11/10/2007 0000   CREATININE 0.55 09/02/2023 0350   CREATININE 0.55 09/01/2023 0324   CREATININE 0.74 06/26/2020 1522   GLUCOSE 94 09/02/2023 0350   GLUCOSE 102 (H) 09/01/2023 0324   CALCIUM  8.4 (L) 09/02/2023 0350   CALCIUM  8.6 (L) 09/01/2023 0324   AST 14 (L) 09/02/2023 0350   AST 14 (L) 09/01/2023 0324   ALT 11 09/02/2023 0350   ALT 12 09/01/2023 0324   ALKPHOS 59 09/02/2023 0350   ALKPHOS 70 09/01/2023 0324   BILITOT 0.3 09/02/2023 0350   BILITOT 0.6 09/01/2023 0324   PROT 5.1 (L) 09/02/2023 0350   PROT 5.3 (L) 09/01/2023 0324   ALBUMIN  2.1 (L) 09/02/2023 0350   ALBUMIN  2.0 (L) 09/01/2023 0324    Studies/Results: No results found.    [name] 09/02/2023  Patient ID: Angela [name] March, female   DOB: 1950/08/10, 73 y.o.   MRN: [id number]

## 2023-09-02 NOTE — Progress Notes (Signed)
 Mobility Specialist: Progress Note   09/02/23 1347  Mobility  Activity Ambulated with assistance in hallway  Level of Assistance Standby assist, set-up cues, supervision of patient - no hands on  Assistive Device Other (Comment) (IV pole)  Distance Ambulated (ft) 600 ft  Activity Response Tolerated well  Mobility Referral Yes  Mobility visit 1 Mobility  Mobility Specialist Start Time (ACUTE ONLY) 1155  Mobility Specialist Stop Time (ACUTE ONLY) 1210  Mobility Specialist Time Calculation (min) (ACUTE ONLY) 15 min    Pt received in bed, agreeable to mobility session. No complaints. VSS on RA. Returned to room without fault. Left in bed with all needs met, call bell in reach. Visitor in room.    Ileana Lute Mobility Specialist Please contact via SecureChat or Rehab office at [phone]

## 2023-09-02 NOTE — Progress Notes (Signed)
 PROGRESS NOTE    Angela [name]  FMW:[id number] DOB: [birth year] DOA: 07/30/2023 PCP: Catherine Charlies LABOR, DO  Chief Complaint  Patient presents with   Abdominal Pain   Hematuria   Shortness of Breath    Brief Narrative:   Patient is Angela [name] 73 y.o.  female with history of HTN, HLD who presented with worsening abdominal pain-patient was found to have sigmoid perforation with feculent peritonitis in the setting of stercoral colitis.  Patient underwent sigmoidectomy with end colostomy on 6/22-unfortunately postop course complicated by ileus and pelvic residual abscesses requiring IR drain placement.     Significant events: 6/21>> evaluated in the ED for abdominal pain-found to have left mid ureteral stone-9 mm with hydronephrosis-and large volume of stool-managed with supportive care and discharged home. 6/22>> worsening abdominal pain-repeat CT scan consistent with bowel perforation-surgery consult-admit to [facility]. 6/29>> CT abdomen with pelvic abscess. 6/30>> CT-guided drain and pelvic abscess by IR 7/04>> vomiting-recurrent ileus-NG tube reinserted 7/05>> TNA started-repeat CT abdomen increased size of abscess. 7/07>> IR placed RLQ drain and right transgluteal pelvic drain. 7/10>> NGT removed   Significant studies: 6/22>> CT renal stone study: Interval development of intraperitoneal free air-moderate volume free fluid-consistent with bowel perforation, persistent large volume stool with dilated sigmoid colon.  Similar location of left ureteral stone-9 mm. 6/29>> CT abdomen/pelvis: S/p partial colectomy-LLQ ostomy-complex free fluid with multiple foci of air-most pronounced in the surgical bed-possibility of superimposed infection or bowel leak cannot be excluded. 6/30>> echo: EF 60-65%. 7/05>> CT abdomen/pelvis: Previous collection with pigtail has increased in size, other less well-defined but enlarging peritoneal fluid collections.  Persistent-partially obstructing 11 mm calculus in the distal  left ureter.  Persistent fluid loops of dilated small bowel-likely ileus.   Significant microbiology data: 6/22>> urine culture: No growth 6/22>> blood culture: No growth 6/30>> pelvic abscess culture: Gram-negative rods/gram-positive cocci-mixed anaerobic flora. 7/07>> Pelvic abscess culture:moderate proteus and enterococcus   Procedures: 6/22>> sigmoidectomy with end colostomy.  By general surgeon Dr. Polly 6/30>> CT-guided drain and pelvic abscess by IR 08/19/2023 1.  Cystoscopy with left ureteral stent placement. 2. left retrograde pyelogram with interpretation. 3. Fluoroscopy <1 hour with intraoperative interpretation by urologist Dr. Arlyss Foot   Consults: General Surgery IR Urology  Assessment & Plan:   Principal Problem:   Bowel perforation (HCC) Active Problems:   Hydroureteronephrosis   Chronic idiopathic constipation   Metabolic acidosis with increased anion gap and accumulation of organic acids   Hyponatremia   Essential hypertension   Glaucoma   Perforated sigmoid colon (HCC)   Protein-calorie malnutrition, severe  Sepsis secondary to stercoral colitis with ischemia/perforation of sigmoid colon with feculent peritonitis S/p ex lap sigmoidectomy with end colostomy 6/22  complicated by postoperative ileus and then residual pelvic abscess requiring CT-guided drain placement by IR General surgery and IR following S/p repositioning of slightly displaced percutaneous drainage catheter within the center of the residual fluid collection on 7/23 Surgery recommending soft diet - dressing changes per surgery.   Currently on TPN, will follow how she does with PO - calorie count to plan weaning of TPN, PO intake sounds inconsistent - appreciate surgery assistance, maybe decrease TPN tmrw? Abscess RLQ growing proteus mirabilis, enterococcus faecalis, bacteroides thetaiotaomicron, beta lactamase positive Abx have been completed as of 7/20, appreciate surgery recs    Hydronephrosis secondary to distal left ureteral stone Urology following-with recommendations for supportive care for now-now that her medical problems have more or less stabilized underwent left ureteral stent placement by urology Dr. Arlyss  Machen on 08/18/2023 Plan for follow up after discharge, they're going to work on follow up (7/24)   UTI Awaiting final culture results Gram negative rods - follow Will start treatment with ceftriaxone   Hospital delirium Secondary to acute illness/narcotics Now resolved   Normocytic anemia Due to acute illness,+/- coffee-ground emesis on 7/7 S/p 2 units pRBC 7/20 Hb remains stable   Thrombocytosis Likely reactive Supportive care   Coffee-ground NG tube emesis Seems to have resolved Continue PPI  Hypomagnesemia/hypophosphatemia/hypokalemia Secondary to NG tube suctioning Resolved.   HTN Holding home BP meds for now Will monitor    Lower extremity edema Secondary to hypoalbuminemia/IV fluid resuscitation in the setting of acute illness Echo with preserved EF-UA negative for proteinuria Consider diuretics when more stable   Chronic idiopathic constipation Bowel function recovering-brown stools - good output for now   Odynophagia secondary to oral/aphthous ulcers Improved on Valtrex and Magic mouthwash Supportive care   Glaucoma Timolol  eyedrops   Insomnia Appears to be primarily due to pain-anxiety Schedule trazodone  at night  Debility/deconditioning PT OT eval-Home health recommended.     DVT prophylaxis: lovenox  Code Status: full Family Communication: none Disposition:   Status is: Inpatient Remains inpatient appropriate because: need for continued inpatient care   Consultants:  IR surgery  Procedures:   6/22  Sigmoidectomy with end colostomy   6/30 CT guided drain placement   7/7 CT guided placement of RLQ drain and right transgluteal pelvic drain   7/11 1.  Cystoscopy with left ureteral stent  placement 2. left retrograde pyelogram with interpretation 3. Fluoroscopy <1 hour with intraoperative interpretation    Antimicrobials:  Anti-infectives (From admission, onward)    Start     Dose/Rate Route Frequency Ordered Stop   09/01/23 0930  cefTRIAXone  (ROCEPHIN ) 1 g in sodium chloride  0.9 % 100 mL IVPB        1 g 200 mL/hr over 30 Minutes Intravenous Daily 09/01/23 0816     08/31/23 1115  fluconazole  (DIFLUCAN ) tablet 150 mg        150 mg Oral  Once 08/31/23 1029 08/31/23 1250   08/18/23 1800  Ampicillin -Sulbactam (UNASYN ) 3 g in sodium chloride  0.9 % 100 mL IVPB  Status:  Discontinued        3 g 200 mL/hr over 30 Minutes Intravenous Every 6 hours 08/18/23 0922 08/27/23 1241   08/06/23 1130  acyclovir  (ZOVIRAX ) 200 MG capsule 400 mg  Status:  Discontinued        400 mg Oral 3 times daily 08/06/23 1037 08/08/23 1424   08/06/23 1100  piperacillin -tazobactam (ZOSYN ) IVPB 3.375 g        3.375 g 12.5 mL/hr over 240 Minutes Intravenous Every 8 hours 08/06/23 1051 08/18/23 1259   07/30/23 1800  piperacillin -tazobactam (ZOSYN ) IVPB 3.375 g        3.375 g 12.5 mL/hr over 240 Minutes Intravenous Every 8 hours 07/30/23 1332 08/04/23 2359   07/30/23 1400  piperacillin -tazobactam (ZOSYN ) IVPB 3.375 g  Status:  Discontinued        3.375 g 100 mL/hr over 30 Minutes Intravenous Every 8 hours 07/30/23 1329 07/30/23 1332   07/30/23 1015  piperacillin -tazobactam (ZOSYN ) IVPB 3.375 g        3.375 g 100 mL/hr over 30 Minutes Intravenous  Once 07/30/23 1013 07/30/23 1311       Subjective: No new complaints  Objective: Vitals:   09/01/23 2342 09/02/23 0434 09/02/23 0500 09/02/23 1031  BP: (!) 140/79 (!) 162/80  127/70  Pulse:  69 73  68  Resp: 16 20  15   Temp: 98.3 F (36.8 C) 97.9 F (36.6 C)    TempSrc: Oral Oral    SpO2: 98% 97%  96%  Weight:   56.6 kg   Height:        Intake/Output Summary (Last 24 hours) at 09/02/2023 1417 Last data filed at 09/02/2023 0631 Gross per 24 hour   Intake 634.08 ml  Output 810 ml  Net -175.92 ml   Filed Weights   08/27/23 0500 08/31/23 0522 09/02/23 0500  Weight: 59.1 kg 55.7 kg 56.6 kg    Examination:  General: No acute distress. Cardiovascular: RRR Lungs: unlabored Abdomen: appropriately ttp Purulent output in JP Neurological: Alert and oriented 3. Moves all extremities 4 with equal strength. Cranial nerves II through XII grossly intact. Skin: Warm and dry. No rashes or lesions. Extremities: No clubbing or cyanosis. No edema.   Data Reviewed: I have personally reviewed following labs and imaging studies  CBC: Recent Labs  Lab 08/29/23 0407 08/30/23 0320 08/31/23 0358 09/01/23 0324 09/02/23 0350  WBC 8.2 7.7 7.5 6.1 5.3  NEUTROABS 5.5 5.1 5.1 3.4 2.6  HGB 10.4* 10.1* 10.2* 10.3* 9.9*  HCT 32.0* 31.3* 31.9* 31.9* 31.1*  MCV 90.7 91.5 92.2 93.3 92.8  PLT 422* 359 360 328 305    Basic Metabolic Panel: Recent Labs  Lab 08/29/23 0407 08/30/23 0320 08/31/23 0358 09/01/23 0324 09/02/23 0350  NA 136 138 137 137 136  K 3.8 4.1 4.1 3.6 3.9  CL 105 104 102 103 102  CO2 25 27 26 27 25   GLUCOSE 96 108* 108* 102* 94  BUN 19 20 23 20 16   CREATININE 0.41* 0.49 0.50 0.55 0.55  CALCIUM  8.5* 8.5* 8.4* 8.6* 8.4*  MG 1.7 1.7 2.0 1.8 1.7  PHOS 3.8 4.2 4.5 4.2 3.5    GFR: Estimated Creatinine Clearance: 56.5 mL/min (by C-G formula based on SCr of 0.55 mg/dL).  Liver Function Tests: Recent Labs  Lab 08/29/23 0407 08/30/23 0320 08/31/23 0358 09/01/23 0324 09/02/23 0350  AST 12* 12* 15 14* 14*  ALT 11 11 11 12 11   ALKPHOS 65 66 63 70 59  BILITOT 0.4 0.4 0.5 0.6 0.3  PROT 5.2* 5.3* 5.2* 5.3* 5.1*  ALBUMIN  2.0* 2.0* 2.0* 2.0* 2.1*    CBG: Recent Labs  Lab 08/31/23 1547 08/31/23 2307 09/01/23 0419 09/01/23 2354 09/02/23 0648  GLUCAP 83 107* 112* 88 97     Recent Results (from the past 240 hours)  Urine Culture (for pregnant, neutropenic or urologic patients or patients with an indwelling urinary  catheter)     Status: Abnormal (Preliminary result)   Collection Time: 09/01/23  8:30 AM   Specimen: Urine, Clean Catch  Result Value Ref Range Status   Specimen Description URINE, CLEAN CATCH  Final   Special Requests NONE  Final   Culture (Mayerli Kirst)  Final    >=100,000 COLONIES/mL GRAM NEGATIVE RODS SUSCEPTIBILITIES TO FOLLOW Performed at [facility], [address] [place], [place] [address]    Report Status PENDING  Incomplete         Radiology Studies: No results found.       Scheduled Meds:  sodium chloride    Intravenous Once   acetaminophen   1,000 mg Oral Q6H   bisacodyl   10 mg Rectal Daily   carvedilol   6.25 mg Oral BID WC   Chlorhexidine  Gluconate Cloth  6 each Topical Daily   enoxaparin  (LOVENOX ) injection  40 mg  Subcutaneous Q24H   feeding supplement  237 mL Oral BID BM   ferrous sulfate   325 mg Oral BID WC   folic acid   1 mg Oral Daily   latanoprost   1 drop Both Eyes QHS   methocarbamol   500 mg Oral TID   Or   methocarbamol  (ROBAXIN ) injection  500 mg Intravenous TID   pantoprazole   40 mg Oral BID   polyethylene glycol  17 g Oral BID   timolol   1 drop Both Eyes BID   traZODone   50 mg Oral QHS   Continuous Infusions:  cefTRIAXone  (ROCEPHIN )  IV 1 g (09/02/23 1043)   TPN ADULT (ION) 35 mL/hr at 09/01/23 1716   TPN ADULT (ION)       LOS: 34 days    Time spent: over 30 min    Meliton Monte, MD [facility]   To contact the attending provider between 7A-7P or the covering provider during after hours 7P-7A, please log into the web site [email or web address] and access using universal [place] password for that web site. If you do not have the password, please call the hospital operator.  09/02/2023, 2:17 PM

## 2023-09-02 NOTE — Progress Notes (Signed)
 PHARMACY - TOTAL PARENTERAL NUTRITION CONSULT NOTE  Indication: intolerance to feeding, recurrent ileus  Patient Measurements: Height: 5' 4 (162.6 cm) Weight: 56.6 kg (124 lb 12.5 oz) IBW/kg (Calculated) : 54.7 TPN AdjBW (KG): 61.2 Body mass index is 21.42 kg/m. Usual Weight: ~59 kg  Assessment:  72 YOF s/p exp lap with Hartmann's procedure for stercoral colitis with ischemia and perforation with feculent peritonitis on 6/22. Noted pt has been NPO/clear liquid/full liquid diet since after surgery with limited intake. Pt now with ileus on Xray (dilated loops of small bowel) and N/V with liquid diet so NGT replaced 7/4, now NPO and starting TPN. Pt at risk of refeeding with prolonged period of little PO intake since admission.  Glucose / Insulin : no hx DM - CBGs < 180, off insulin  Electrolytes: K 3.9 (goal >/= 4 for ileus), Mag 1.7 post 2gm IV (goal >/= 2 for ileus), others WNL Renal: SCr < 1, BUN WNL Hepatic: LFTs / tbili WNL, albumin  2, TG down at 87 Intake / Output; MIVF: UOP 0.5 ml/kg/hr, drain 10mL, ostomy  GI Imaging: 7/5 abd xray - persistent dilated loops of small bowel 7/5 CT - pelvic fluid collection slightly enlarged, suspicious for peritonitis, partially obstructing calculus in L ureter, ileus 7/16 CT small bowel obstruction 7/17 abd XR - SBO 7/19 abd XR - enteric tube in stomach, improving bowel gas pattern, oral contrast in R colon  7/20 Abd XR -  Large bowel oral contrast, gas pattern improved GI Surgeries / Procedures:  6/22 ex-lap with Hartmann's procedure for stercoral colitis with ischemia and perf with feculent peritonitis 7/8 CT-guided RLQ abd abscess and transgluteal pelvic drains [>473mL tan colored fluid] 7/10 NGT removed  7/11 NGT replaced  7/16 ureteral stent placement   Central access: PICC placed 08/12/23 TPN start date: 08/12/23  Nutritional Goals: Goal TPN 70 ml/hr will provide 89g AA, 1660 kCal   RD Estimated Needs Total Energy Estimated  Needs: 1650-1850 kcals Total Protein Estimated Needs: 80-90 g Total Fluid Estimated Needs: >/= 1.7 L  Current Nutrition:  TPN - consume 1/3rd of usual intake, appetite improving Soft diet 7/24 Ensure Plus High Protein BID - 2 charted given on 7/25, drank most of them per patient  Plan:   Continue TPN at 35 ml/hr to provide 50% of nutritional needs Electrolytes in TPN: Na 150 mEq/L, K 80 mEq/L, Ca 2 mEq/L, Mg 15 mEq/L, Phos 5 mmol/L, Cl:Ac 1:1 (max Na, K and Mag in TPN) Add multivitamin trace elements to TPN  Mag sulfate 4gm IV x 1 KCL x 2 runs Monitor TPN labs on Mon/Thurs   Silviano Neuser D. Lendell, PharmD, BCPS, BCCCP 09/02/2023, 10:11 AM

## 2023-09-03 MED ORDER — ADULT MULTIVITAMIN W/MINERALS CH
1.0000 | ORAL_TABLET | Freq: Every day | ORAL | Status: DC
  Administered 2023-09-04: 1 via ORAL
  Filled 2023-09-03: qty 1

## 2023-09-03 NOTE — Progress Notes (Signed)
 PHARMACY - TOTAL PARENTERAL NUTRITION CONSULT NOTE  Indication: intolerance to feeding, recurrent ileus  Patient Measurements: Height: 5' 4 (162.6 cm) Weight: 56.6 kg (124 lb 12.5 oz) IBW/kg (Calculated) : 54.7 TPN AdjBW (KG): 61.2 Body mass index is 21.42 kg/m. Usual Weight: ~59 kg  Assessment:  72 YOF s/p exp lap with Hartmann's procedure for stercoral colitis with ischemia and perforation with feculent peritonitis on 6/22. Noted pt has been NPO/clear liquid/full liquid diet since after surgery with limited intake. Pt now with ileus on Xray (dilated loops of small bowel) and N/V with liquid diet so NGT replaced 7/4, now NPO and starting TPN. Pt at risk of refeeding with prolonged period of little PO intake since admission.  Glucose / Insulin : no hx DM - CBGs < 180, off insulin  Electrolytes: 7/26 labs - K 3.9 and 2 runs given (goal >/= 4 for ileus), Mag 1.7 and 4gm given (goal >/= 2 for ileus), others WNL Renal: SCr < 1, BUN WNL Hepatic: LFTs / tbili WNL, albumin  2, TG down at 87 Intake / Output; MIVF: UOP not charted, drain 15mL, ostomy  GI Imaging: 7/5 abd xray - persistent dilated loops of small bowel 7/5 CT - pelvic fluid collection slightly enlarged, suspicious for peritonitis, partially obstructing calculus in L ureter, ileus 7/16 CT small bowel obstruction 7/17 abd XR - SBO 7/19 abd XR - enteric tube in stomach, improving bowel gas pattern, oral contrast in R colon  7/20 Abd XR -  Large bowel oral contrast, gas pattern improved GI Surgeries / Procedures:  6/22 ex-lap with Hartmann's procedure for stercoral colitis with ischemia and perf with feculent peritonitis 7/8 CT-guided RLQ abd abscess and transgluteal pelvic drains [>426mL tan colored fluid] 7/10 NGT removed  7/11 NGT replaced  7/16 ureteral stent placement   Central access: PICC placed 08/12/23 TPN start date: 08/12/23  Nutritional Goals: Goal TPN 70 ml/hr will provide 89g AA, 1660 kCal   RD Estimated  Needs Total Energy Estimated Needs: 1650-1850 kcals Total Protein Estimated Needs: 80-90 g Total Fluid Estimated Needs: >/= 1.7 L  Current Nutrition:  TPN Soft diet 7/24 - consume >60% of meals, doesn't eat much PTA per patient/family Ensure Plus High Protein BID - 2 charted given on 7/26, drank most of them  Plan:   D/C TPN post current bag per discussion with [facility]/Surgery Reduce TPN to 20 ml/hr, then stop at 1800 Adult multivitamin PO daily D/C TPN labs and nursing care orders  Doriana Mazurkiewicz D. Lendell, PharmD, BCPS, BCCCP 09/03/2023, 9:58 AM

## 2023-09-03 NOTE — Progress Notes (Signed)
    Assessment & Plan: POD#35 - S/p ex lap with Hartmann's procedure for stercoral colitis with ischemia and perforation with feculent peritonitis, Dr. Polly 6/22  - CT scan 7/22 right transgluteal pigtail - IR repositioned 7/23 - Continue BID dressing changes to midline - WOCN following for ostomy care.  - Mobilize, PT - recommending HH - Pulm toilet    FEN: Soft diet, wean TPN VTE: Lovenox , SCDs ID: Rocephin  for UTI. Afebrile   Better appetite this AM, ambulating.  Discussed discontinuing TNA with Dr. Perri and pharmacy - will discontinue today.  Will need follow up in IR drain clinic.  Hopefully home next couple of days.        [name], MD [facility] practice Office: [phone]        Chief Complaint: Colonic perforation  Subjective: Patient up at bedside today, family in room.  Ate 90% of breakfast.  Objective: Vital signs in last 24 hours: Temp:  [97.9 F (36.6 C)-98.8 F (37.1 C)] 98.8 F (37.1 C) (07/27 0840) Pulse Rate:  [67-68] 67 (07/27 0844) Resp:  [13-15] 13 (07/27 0840) BP: (120-145)/(70-86) 145/86 (07/27 0844) SpO2:  [94 %-96 %] 94 % (07/27 0840) Last BM Date : 09/02/23  Intake/Output from previous day: 07/26 0701 - 07/27 0700 In: 701.5 [P.O.:600; I.V.:1; IV Piggyback:100.4] Out: 215 [Drains:15; Stool:200] Intake/Output this shift: No intake/output data recorded.  Physical Exam: HEENT - sclerae clear, mucous membranes moist Abdomen - drain with thin brown output, moderate  Lab Results:  Recent Labs    09/01/23 0324 09/02/23 0350  WBC 6.1 5.3  HGB 10.3* 9.9*  HCT 31.9* 31.1*  PLT 328 305   BMET Recent Labs    09/01/23 0324 09/02/23 0350  NA 137 136  K 3.6 3.9  CL 103 102  CO2 27 25  GLUCOSE 102* 94  BUN 20 16  CREATININE 0.55 0.55  CALCIUM  8.6* 8.4*   PT/INR No results for input(s): LABPROT, INR in the last 72 hours. Comprehensive Metabolic Panel:    Component Value Date/Time   NA 136  09/02/2023 0350   NA 137 09/01/2023 0324   NA 140 11/10/2007 0000   K 3.9 09/02/2023 0350   K 3.6 09/01/2023 0324   CL 102 09/02/2023 0350   CL 103 09/01/2023 0324   CO2 25 09/02/2023 0350   CO2 27 09/01/2023 0324   BUN 16 09/02/2023 0350   BUN 20 09/01/2023 0324   BUN 10 11/10/2007 0000   CREATININE 0.55 09/02/2023 0350   CREATININE 0.55 09/01/2023 0324   CREATININE 0.74 06/26/2020 1522   GLUCOSE 94 09/02/2023 0350   GLUCOSE 102 (H) 09/01/2023 0324   CALCIUM  8.4 (L) 09/02/2023 0350   CALCIUM  8.6 (L) 09/01/2023 0324   AST 14 (L) 09/02/2023 0350   AST 14 (L) 09/01/2023 0324   ALT 11 09/02/2023 0350   ALT 12 09/01/2023 0324   ALKPHOS 59 09/02/2023 0350   ALKPHOS 70 09/01/2023 0324   BILITOT 0.3 09/02/2023 0350   BILITOT 0.6 09/01/2023 0324   PROT 5.1 (L) 09/02/2023 0350   PROT 5.3 (L) 09/01/2023 0324   ALBUMIN  2.1 (L) 09/02/2023 0350   ALBUMIN  2.0 (L) 09/01/2023 0324    Studies/Results: No results found.    [name] 09/03/2023  Patient ID: Angela [name] March, female   DOB: 05/20/1950, 73 y.o.   MRN: [id number]

## 2023-09-03 NOTE — Plan of Care (Signed)

## 2023-09-03 NOTE — Plan of Care (Signed)
  Problem: Education: Goal: Knowledge of General Education information will improve Description: Including pain rating scale, medication(s)/side effects and non-pharmacologic comfort measures Outcome: Progressing   Problem: Clinical Measurements: Goal: Ability to maintain clinical measurements within normal limits will improve Outcome: Progressing Goal: Will remain free from infection Outcome: Progressing Goal: Diagnostic test results will improve Outcome: Progressing   Problem: Pain Managment: Goal: General experience of comfort will improve and/or be controlled Outcome: Progressing   Problem: Safety: Goal: Ability to remain free from injury will improve Outcome: Progressing   Problem: Skin Integrity: Goal: Risk for impaired skin integrity will decrease Outcome: Progressing

## 2023-09-03 NOTE — Progress Notes (Signed)
 PROGRESS NOTE    Angela [name]  FMW:[id number] DOB: [birth year] DOA: 07/30/2023 PCP: Catherine Charlies LABOR, DO  Chief Complaint  Patient presents with   Abdominal Pain   Hematuria   Shortness of Breath    Brief Narrative:   Patient is Angela [name] 73 y.o.  female with history of HTN, HLD who presented with worsening abdominal pain-patient was found to have sigmoid perforation with feculent peritonitis in the setting of stercoral colitis.  Patient underwent sigmoidectomy with end colostomy on 6/22-unfortunately postop course complicated by ileus and pelvic residual abscesses requiring IR drain placement.     Significant events: 6/21>> evaluated in the ED for abdominal pain-found to have left mid ureteral stone-9 mm with hydronephrosis-and large volume of stool-managed with supportive care and discharged home. 6/22>> worsening abdominal pain-repeat CT scan consistent with bowel perforation-surgery consult-admit to [facility]. 6/29>> CT abdomen with pelvic abscess. 6/30>> CT-guided drain and pelvic abscess by IR 7/04>> vomiting-recurrent ileus-NG tube reinserted 7/05>> TNA started-repeat CT abdomen increased size of abscess. 7/07>> IR placed RLQ drain and right transgluteal pelvic drain. 7/10>> NGT removed   Significant studies: 6/22>> CT renal stone study: Interval development of intraperitoneal free air-moderate volume free fluid-consistent with bowel perforation, persistent large volume stool with dilated sigmoid colon.  Similar location of left ureteral stone-9 mm. 6/29>> CT abdomen/pelvis: S/p partial colectomy-LLQ ostomy-complex free fluid with multiple foci of air-most pronounced in the surgical bed-possibility of superimposed infection or bowel leak cannot be excluded. 6/30>> echo: EF 60-65%. 7/05>> CT abdomen/pelvis: Previous collection with pigtail has increased in size, other less well-defined but enlarging peritoneal fluid collections.  Persistent-partially obstructing 11 mm calculus in the distal  left ureter.  Persistent fluid loops of dilated small bowel-likely ileus.   Significant microbiology data: 6/22>> urine culture: No growth 6/22>> blood culture: No growth 6/30>> pelvic abscess culture: Gram-negative rods/gram-positive cocci-mixed anaerobic flora. 7/07>> Pelvic abscess culture:moderate proteus and enterococcus   Procedures: 6/22>> sigmoidectomy with end colostomy.  By general surgeon Dr. Polly 6/30>> CT-guided drain and pelvic abscess by IR 08/19/2023 1.  Cystoscopy with left ureteral stent placement. 2. left retrograde pyelogram with interpretation. 3. Fluoroscopy <1 hour with intraoperative interpretation by urologist Dr. Arlyss Foot   Consults: General Surgery IR Urology  Assessment & Plan:   Principal Problem:   Bowel perforation (HCC) Active Problems:   Hydroureteronephrosis   Chronic idiopathic constipation   Metabolic acidosis with increased anion gap and accumulation of organic acids   Hyponatremia   Essential hypertension   Glaucoma   Perforated sigmoid colon (HCC)   Protein-calorie malnutrition, severe  Sepsis secondary to stercoral colitis with ischemia/perforation of sigmoid colon with feculent peritonitis S/p ex lap sigmoidectomy with end colostomy 6/22  complicated by postoperative ileus and then residual pelvic abscess requiring CT-guided drain placement by IR General surgery and IR following S/p repositioning of slightly displaced percutaneous drainage catheter within the center of the residual fluid collection on 7/23 Surgery recommending soft diet - dressing changes per surgery.   Currently on TPN, will follow how she does with PO - d/c tpn, will watch PO intake - hopefully d/c soon Abscess RLQ growing proteus mirabilis, enterococcus faecalis, bacteroides thetaiotaomicron, beta lactamase positive Abx have been completed as of 7/20, appreciate surgery recs   Hydronephrosis secondary to distal left ureteral stone Urology following-with  recommendations for supportive care for now-now that her medical problems have more or less stabilized underwent left ureteral stent placement by urology Dr. Arlyss Foot on 08/18/2023 Plan for follow up after discharge,  they're going to work on follow up (7/24)   UTI Awaiting final culture results Gram negative rods - follow Will start treatment with ceftriaxone   Hospital delirium Secondary to acute illness/narcotics Now resolved   Normocytic anemia Due to acute illness,+/- coffee-ground emesis on 7/7 S/p 2 units pRBC 7/20 Hb remains stable   Thrombocytosis Likely reactive Supportive care   Coffee-ground NG tube emesis Seems to have resolved Continue PPI  Hypomagnesemia/hypophosphatemia/hypokalemia Secondary to NG tube suctioning Resolved.   HTN Holding home BP meds for now Will monitor    Lower extremity edema Secondary to hypoalbuminemia/IV fluid resuscitation in the setting of acute illness Echo with preserved EF-UA negative for proteinuria Consider diuretics when more stable   Chronic idiopathic constipation Bowel function recovering-brown stools - good output for now   Odynophagia secondary to oral/aphthous ulcers Improved on Valtrex and Magic mouthwash Supportive care   Glaucoma Timolol  eyedrops   Insomnia Appears to be primarily due to pain-anxiety Schedule trazodone  at night  Debility/deconditioning PT OT eval-Home health recommended.     DVT prophylaxis: lovenox  Code Status: full Family Communication: none Disposition:   Status is: Inpatient Remains inpatient appropriate because: need for continued inpatient care   Consultants:  IR surgery  Procedures:   6/22  Sigmoidectomy with end colostomy   6/30 CT guided drain placement   7/7 CT guided placement of RLQ drain and right transgluteal pelvic drain   7/11 1.  Cystoscopy with left ureteral stent placement 2. left retrograde pyelogram with interpretation 3. Fluoroscopy <1 hour  with intraoperative interpretation    Antimicrobials:  Anti-infectives (From admission, onward)    Start     Dose/Rate Route Frequency Ordered Stop   09/01/23 0930  cefTRIAXone  (ROCEPHIN ) 1 g in sodium chloride  0.9 % 100 mL IVPB        1 g 200 mL/hr over 30 Minutes Intravenous Daily 09/01/23 0816     08/31/23 1115  fluconazole  (DIFLUCAN ) tablet 150 mg        150 mg Oral  Once 08/31/23 1029 08/31/23 1250   08/18/23 1800  Ampicillin -Sulbactam (UNASYN ) 3 g in sodium chloride  0.9 % 100 mL IVPB  Status:  Discontinued        3 g 200 mL/hr over 30 Minutes Intravenous Every 6 hours 08/18/23 0922 08/27/23 1241   08/06/23 1130  acyclovir  (ZOVIRAX ) 200 MG capsule 400 mg  Status:  Discontinued        400 mg Oral 3 times daily 08/06/23 1037 08/08/23 1424   08/06/23 1100  piperacillin -tazobactam (ZOSYN ) IVPB 3.375 g        3.375 g 12.5 mL/hr over 240 Minutes Intravenous Every 8 hours 08/06/23 1051 08/18/23 1259   07/30/23 1800  piperacillin -tazobactam (ZOSYN ) IVPB 3.375 g        3.375 g 12.5 mL/hr over 240 Minutes Intravenous Every 8 hours 07/30/23 1332 08/04/23 2359   07/30/23 1400  piperacillin -tazobactam (ZOSYN ) IVPB 3.375 g  Status:  Discontinued        3.375 g 100 mL/hr over 30 Minutes Intravenous Every 8 hours 07/30/23 1329 07/30/23 1332   07/30/23 1015  piperacillin -tazobactam (ZOSYN ) IVPB 3.375 g        3.375 g 100 mL/hr over 30 Minutes Intravenous  Once 07/30/23 1013 07/30/23 1311       Subjective: No new complaints Frient at bedside  Objective: Vitals:   09/03/23 0000 09/03/23 0400 09/03/23 0840 09/03/23 0844  BP: 127/70 131/83 (!) 145/86 (!) 145/86  Pulse:   67 67  Resp:  13   Temp: 97.9 F (36.6 C) 98.7 F (37.1 C) 98.8 F (37.1 C)   TempSrc: Oral Oral Oral   SpO2:   94%   Weight:      Height:        Intake/Output Summary (Last 24 hours) at 09/03/2023 1106 Last data filed at 09/02/2023 2000 Gross per 24 hour  Intake 701.45 ml  Output 15 ml  Net 686.45 ml    Filed Weights   08/27/23 0500 08/31/23 0522 09/02/23 0500  Weight: 59.1 kg 55.7 kg 56.6 kg    Examination:  General: No acute distress. Lungs: unlabored Abdomen: purulent drain output. Neurological: Alert and oriented 3. Moves all extremities 4 with equal strength. Cranial nerves II through XII grossly intact. Extremities: No clubbing or cyanosis. No edema.  Data Reviewed: I have personally reviewed following labs and imaging studies  CBC: Recent Labs  Lab 08/29/23 0407 08/30/23 0320 08/31/23 0358 09/01/23 0324 09/02/23 0350  WBC 8.2 7.7 7.5 6.1 5.3  NEUTROABS 5.5 5.1 5.1 3.4 2.6  HGB 10.4* 10.1* 10.2* 10.3* 9.9*  HCT 32.0* 31.3* 31.9* 31.9* 31.1*  MCV 90.7 91.5 92.2 93.3 92.8  PLT 422* 359 360 328 305    Basic Metabolic Panel: Recent Labs  Lab 08/29/23 0407 08/30/23 0320 08/31/23 0358 09/01/23 0324 09/02/23 0350  NA 136 138 137 137 136  K 3.8 4.1 4.1 3.6 3.9  CL 105 104 102 103 102  CO2 25 27 26 27 25   GLUCOSE 96 108* 108* 102* 94  BUN 19 20 23 20 16   CREATININE 0.41* 0.49 0.50 0.55 0.55  CALCIUM  8.5* 8.5* 8.4* 8.6* 8.4*  MG 1.7 1.7 2.0 1.8 1.7  PHOS 3.8 4.2 4.5 4.2 3.5    GFR: Estimated Creatinine Clearance: 56.5 mL/min (by C-G formula based on SCr of 0.55 mg/dL).  Liver Function Tests: Recent Labs  Lab 08/29/23 0407 08/30/23 0320 08/31/23 0358 09/01/23 0324 09/02/23 0350  AST 12* 12* 15 14* 14*  ALT 11 11 11 12 11   ALKPHOS 65 66 63 70 59  BILITOT 0.4 0.4 0.5 0.6 0.3  PROT 5.2* 5.3* 5.2* 5.3* 5.1*  ALBUMIN  2.0* 2.0* 2.0* 2.0* 2.1*    CBG: Recent Labs  Lab 08/31/23 1547 08/31/23 2307 09/01/23 0419 09/01/23 2354 09/02/23 0648  GLUCAP 83 107* 112* 88 97     Recent Results (from the past 240 hours)  Urine Culture (for pregnant, neutropenic or urologic patients or patients with an indwelling urinary catheter)     Status: Abnormal (Preliminary result)   Collection Time: 09/01/23  8:30 AM   Specimen: Urine, Clean Catch  Result  Value Ref Range Status   Specimen Description URINE, CLEAN CATCH  Final   Special Requests NONE  Final   Culture (Nathen Balaban)  Final    >=100,000 COLONIES/mL GRAM NEGATIVE RODS CULTURE REINCUBATED FOR BETTER GROWTH ATTEMPTING TO ISOLATE Performed at [facility], [address], [place], [place] [address]    Report Status PENDING  Incomplete         Radiology Studies: No results found.       Scheduled Meds:  sodium chloride    Intravenous Once   acetaminophen   1,000 mg Oral Q6H   bisacodyl   10 mg Rectal Daily   carvedilol   6.25 mg Oral BID WC   Chlorhexidine  Gluconate Cloth  6 each Topical Daily   enoxaparin  (LOVENOX ) injection  40 mg Subcutaneous Q24H   feeding supplement  237 mL Oral BID BM   ferrous  sulfate  325 mg Oral BID WC   folic acid   1 mg Oral Daily   latanoprost   1 drop Both Eyes QHS   methocarbamol   500 mg Oral TID   Or   methocarbamol  (ROBAXIN ) injection  500 mg Intravenous TID   [START ON 09/04/2023] multivitamin with minerals  1 tablet Oral Daily   pantoprazole   40 mg Oral BID   polyethylene glycol  17 g Oral BID   timolol   1 drop Both Eyes BID   traZODone   50 mg Oral QHS   Continuous Infusions:  cefTRIAXone  (ROCEPHIN )  IV 1 g (09/03/23 0844)   TPN ADULT (ION) 35 mL/hr at 09/02/23 1755     LOS: 35 days    Time spent: over 30 min    Meliton Monte, MD [facility]   To contact the attending provider between 7A-7P or the covering provider during after hours 7P-7A, please log into the web site [email or web address] and access using universal [place] password for that web site. If you do not have the password, please call the hospital operator.  09/03/2023, 11:06 AM

## 2023-09-04 ENCOUNTER — Other Ambulatory Visit (HOSPITAL_COMMUNITY): Payer: Self-pay

## 2023-09-04 LAB — CBC
HCT: 31.3 % — ABNORMAL LOW (ref 36.0–46.0)
Hemoglobin: 9.8 g/dL — ABNORMAL LOW (ref 12.0–15.0)
MCH: 29.3 pg (ref 26.0–34.0)
MCHC: 31.3 g/dL (ref 30.0–36.0)
MCV: 93.7 fL (ref 80.0–100.0)
Platelets: 312 K/uL (ref 150–400)
RBC: 3.34 MIL/uL — ABNORMAL LOW (ref 3.87–5.11)
RDW: 17.2 % — ABNORMAL HIGH (ref 11.5–15.5)
WBC: 5.9 K/uL (ref 4.0–10.5)
nRBC: 0 % (ref 0.0–0.2)

## 2023-09-04 LAB — BASIC METABOLIC PANEL WITH GFR
Anion gap: 7 (ref 5–15)
BUN: 13 mg/dL (ref 8–23)
CO2: 27 mmol/L (ref 22–32)
Calcium: 8.5 mg/dL — ABNORMAL LOW (ref 8.9–10.3)
Chloride: 103 mmol/L (ref 98–111)
Creatinine, Ser: 0.49 mg/dL (ref 0.44–1.00)
GFR, Estimated: >60 mL/min (ref >60)
Glucose, Bld: 94 mg/dL (ref 70–99)
Potassium: 3.4 mmol/L — ABNORMAL LOW (ref 3.5–5.1)
Sodium: 137 mmol/L (ref 135–145)

## 2023-09-04 LAB — URINE CULTURE: Culture: >=100000 — AB

## 2023-09-04 LAB — MAGNESIUM: Magnesium: 1.7 mg/dL (ref 1.7–2.4)

## 2023-09-04 LAB — PHOSPHORUS: Phosphorus: 4.1 mg/dL (ref 2.5–4.6)

## 2023-09-04 LAB — GLUCOSE, CAPILLARY: Glucose-Capillary: 96 mg/dL (ref 70–99)

## 2023-09-04 MED ORDER — ACETAMINOPHEN 500 MG PO TABS: 1000.0000 mg | ORAL_TABLET | Freq: Four times a day (QID) | ORAL | Status: AC | PRN

## 2023-09-04 MED ORDER — SODIUM CHLORIDE 0.9% FLUSH
5.0000 mL | Freq: Every day | INTRAVENOUS | 0 refills | Status: AC
  Filled 2023-09-04: qty 70, 7d supply, fill #0
  Filled 2023-09-04: qty 70, 14d supply, fill #0

## 2023-09-04 MED ORDER — POTASSIUM CHLORIDE CRYS ER 20 MEQ PO TBCR
40.0000 meq | EXTENDED_RELEASE_TABLET | Freq: Once | ORAL | Status: DC
  Filled 2023-09-04: qty 2

## 2023-09-04 MED ORDER — FERROUS SULFATE 325 (65 FE) MG PO TABS
325.0000 mg | ORAL_TABLET | ORAL | 0 refills | Status: DC
  Filled 2023-09-04: qty 15, 30d supply, fill #0

## 2023-09-04 MED ORDER — SULFAMETHOXAZOLE-TRIMETHOPRIM 800-160 MG PO TABS
1.0000 | ORAL_TABLET | Freq: Two times a day (BID) | ORAL | 0 refills | Status: DC
  Filled 2023-09-04: qty 2, 1d supply, fill #0

## 2023-09-04 MED ORDER — CARVEDILOL 6.25 MG PO TABS
6.2500 mg | ORAL_TABLET | Freq: Two times a day (BID) | ORAL | 0 refills | Status: DC
  Filled 2023-09-04: qty 60, 30d supply, fill #0

## 2023-09-04 MED ORDER — OXYCODONE HCL 5 MG PO TABS
5.0000 mg | ORAL_TABLET | Freq: Four times a day (QID) | ORAL | 0 refills | Status: AC | PRN
  Filled 2023-09-04: qty 20, 5d supply, fill #0

## 2023-09-04 MED ORDER — PANTOPRAZOLE SODIUM 40 MG PO TBEC
40.0000 mg | DELAYED_RELEASE_TABLET | Freq: Every day | ORAL | 0 refills | Status: DC
  Filled 2023-09-04: qty 30, 30d supply, fill #0

## 2023-09-04 MED ORDER — POLYETHYLENE GLYCOL 3350 17 GM/SCOOP PO POWD
17.0000 g | Freq: Every day | ORAL | 0 refills | Status: AC
  Filled 2023-09-04: qty 238, 14d supply, fill #0

## 2023-09-04 NOTE — Progress Notes (Signed)
 Physical Therapy Treatment Patient Details Name: Angela [name] MRN: [id number] DOB: January 10, 1951 Today's Date: 09/04/2023   History of Present Illness 73 yo female presents to ED on 6/21 with abdominal pain. Workup for sepsis secondary to bowel perforation. S/p sigmoidectomy with end colostomy 6/22. 6/29 increased WBC and abd pain, s/p CT guided drain placement for pelvic abscess 6/30, repeat drain placement RLQ and R transgluteal pelvic drain 7/7. Pt also with hydroureteronephrosis secondary to L ureteral stone, s/p L ureteral stent placement and L retrograde pyelogram 7/11. PMH includes  HTN, HLD, glaucoma and prior tobacco abuse.    PT Comments  Pt seen for PT tx with pt reporting she's anticipating d/c home soon. Pt reports she is renting a hospital bed to increase ease of bed mobility with stomach. Pt agreeable to stair negotiation as she has stairs without rails but wall on R to enter her home. Pt ambulates in room/bathroom with mod I, in hallway without AD with supervision. Pt negotiates 3 steps x 2 with R rail with min assist fade to CGA as pt with noted decreased strength in BUE hips. Educated pt on positioning of caregiver in relation to her when negotiating stairs. Pt denies any other questions/concerns at this time.   If plan is discharge home, recommend the following: A little help with walking and/or transfers;A little help with bathing/dressing/bathroom;Assistance with cooking/housework;Direct supervision/assist for medications management;Assist for transportation;Help with stairs or ramp for entrance   Can travel by private vehicle        Equipment Recommendations       Recommendations for Other Services       Precautions / Restrictions Precautions Precautions: Fall;Other (comment) Precaution/Restrictions Comments: JP drain and ostomy bag Restrictions Weight Bearing Restrictions Per Provider Order: No     Mobility  Bed Mobility               General bed mobility  comments: pt received & left standing EOB    Transfers       Sit to Stand: Modified independent (Device/Increase time)           General transfer comment: transferred sit<>stand from toilet in bathroom mod I    Ambulation/Gait Ambulation/Gait assistance: Supervision Gait Distance (Feet):  (>200 ft) Assistive device: None Gait Pattern/deviations: Decreased step length - right, Decreased step length - left Gait velocity: slightly decreased     General Gait Details: slightly decreased BUE hip flexion during swing phase, no overt LOB   Stairs Stairs: Yes Stairs assistance: Contact guard assist Stair Management: One rail Right Number of Stairs: 3 (+3 (6 steps)) General stair comments: with R rail as pt reports she has a wall on the R she can hold to, pt with decreased balance/strength in BUE hips, initially requiring min assist then fade to CGA. Pt alternating between step-to & alternating step pattern. Educated pt & family on positioning of caregiver in relation to pt when negotiating stairs.   Wheelchair Mobility     Tilt Bed    Modified Rankin (Stroke Patients Only)       Balance   Sitting-balance support: Feet supported Sitting balance-Leahy Scale: Good     Standing balance support: No upper extremity supported, During functional activity Standing balance-Leahy Scale: Good                              Communication Communication Communication: No apparent difficulties  Cognition Arousal: Alert Behavior During Therapy: WFL for tasks  assessed/performed   PT - Cognitive impairments: No apparent impairments                         Following commands: Intact      Cueing Cueing Techniques: Verbal cues  Exercises      General Comments General comments (skin integrity, edema, etc.): Pt toileted without assistance.      Pertinent Vitals/Pain Pain Assessment Pain Assessment: Faces Faces Pain Scale: Hurts a little bit Pain  Location: lower abdomen Pain Descriptors / Indicators: Discomfort Pain Intervention(s): Monitored during session    Home Living                          Prior Function            PT Goals (current goals can now be found in the care plan section) Acute Rehab PT Goals Patient Stated Goal: to get better PT Goal Formulation: With patient Time For Goal Achievement: 09/04/23 Potential to Achieve Goals: Good Progress towards PT goals: Progressing toward goals    Frequency    Min 3X/week      PT Plan      Co-evaluation              AM-PAC PT 6 Clicks Mobility   Outcome Measure  Help needed turning from your back to your side while in a flat bed without using bedrails?: None Help needed moving from lying on your back to sitting on the side of a flat bed without using bedrails?: A Little Help needed moving to and from a bed to a chair (including a wheelchair)?: None Help needed standing up from a chair using your arms (e.g., wheelchair or bedside chair)?: None Help needed to walk in hospital room?: A Little Help needed climbing 3-5 steps with a railing? : A Little 6 Click Score: 21    End of Session   Activity Tolerance: Patient tolerated treatment well Patient left:  (standing in room beside bed with family present, pt anticipating d/c any minute)   PT Visit Diagnosis: Muscle weakness (generalized) (M62.81);Other abnormalities of gait and mobility (R26.89)     Time: 8861-8852 PT Time Calculation (min) (ACUTE ONLY): 9 min  Charges:    $Therapeutic Activity: 8-22 mins PT General Charges $$ ACUTE PT VISIT: 1 Visit                     Richerd Pinal, PT, DPT 09/04/23, 12:09 PM    Richerd CHRISTELLA Pinal 09/04/2023, 12:07 PM

## 2023-09-04 NOTE — Progress Notes (Signed)
 17 Days Post-Op   Subjective/Chief Complaint: Patient up walking.  Feels better today.  She is eating better she states.   Objective: Vital signs in last 24 hours: Temp:  [97.7 F (36.5 C)-98.7 F (37.1 C)] 98.7 F (37.1 C) (07/28 0556) Pulse Rate:  [69-77] 70 (07/28 0556) Resp:  [14-24] 18 (07/28 0556) BP: (121-156)/(62-86) 156/86 (07/28 0556) SpO2:  [94 %-98 %] 95 % (07/28 0556) Last BM Date : 09/03/23  Intake/Output from previous day: 07/27 0701 - 07/28 0700 In: -  Out: 870 [Urine:700; Drains:20; Stool:150] Intake/Output this shift: No intake/output data recorded.   HEENT - sclerae clear, mucous membranes moist Abdomen - drain with thin brown output, ostomy viable.  Wound with 85% granulation tissue.  Pink otherwise open.  Clean.    Lab Results:  Recent Labs    09/02/23 0350 09/04/23 0340  WBC 5.3 5.9  HGB 9.9* 9.8*  HCT 31.1* 31.3*  PLT 305 312   BMET Recent Labs    09/02/23 0350 09/04/23 0340  NA 136 137  K 3.9 3.4*  CL 102 103  CO2 25 27  GLUCOSE 94 94  BUN 16 13  CREATININE 0.55 0.49  CALCIUM  8.4* 8.5*   PT/INR No results for input(s): LABPROT, INR in the last 72 hours. ABG No results for input(s): PHART, HCO3 in the last 72 hours.  Invalid input(s): PCO2, PO2  Studies/Results: No results found.  Anti-infectives: Anti-infectives (From admission, onward)    Start     Dose/Rate Route Frequency Ordered Stop   09/01/23 0930  cefTRIAXone  (ROCEPHIN ) 1 g in sodium chloride  0.9 % 100 mL IVPB        1 g 200 mL/hr over 30 Minutes Intravenous Daily 09/01/23 0816     08/31/23 1115  fluconazole  (DIFLUCAN ) tablet 150 mg        150 mg Oral  Once 08/31/23 1029 08/31/23 1250   08/18/23 1800  Ampicillin -Sulbactam (UNASYN ) 3 g in sodium chloride  0.9 % 100 mL IVPB  Status:  Discontinued        3 g 200 mL/hr over 30 Minutes Intravenous Every 6 hours 08/18/23 0922 08/27/23 1241   08/06/23 1130  acyclovir  (ZOVIRAX ) 200 MG capsule 400 mg  Status:   Discontinued        400 mg Oral 3 times daily 08/06/23 1037 08/08/23 1424   08/06/23 1100  piperacillin -tazobactam (ZOSYN ) IVPB 3.375 g        3.375 g 12.5 mL/hr over 240 Minutes Intravenous Every 8 hours 08/06/23 1051 08/18/23 1259   07/30/23 1800  piperacillin -tazobactam (ZOSYN ) IVPB 3.375 g        3.375 g 12.5 mL/hr over 240 Minutes Intravenous Every 8 hours 07/30/23 1332 08/04/23 2359   07/30/23 1400  piperacillin -tazobactam (ZOSYN ) IVPB 3.375 g  Status:  Discontinued        3.375 g 100 mL/hr over 30 Minutes Intravenous Every 8 hours 07/30/23 1329 07/30/23 1332   07/30/23 1015  piperacillin -tazobactam (ZOSYN ) IVPB 3.375 g        3.375 g 100 mL/hr over 30 Minutes Intravenous  Once 07/30/23 1013 07/30/23 1311       Assessment/Plan: POD#36 - S/p ex lap with Hartmann's procedure for stercoral colitis with ischemia and perforation with feculent peritonitis, Dr. Polly 6/22  - CT scan 7/22 right transgluteal pigtail - IR repositioned 7/23 - Continue BID dressing changes to midline - WOCN following for ostomy care.  - Mobilize, PT - recommending HH - Pulm toilet    FEN:  Soft diet, wean TPN VTE: Lovenox , SCDs ID: Rocephin  for UTI. Afebrile   Better appetite this AM, ambulating. .  Will need follow up in IR drain clinic.  Hopefully home next couple of days. She looks well today.  She is hungry.  Can be discharged once her p.o. intake adequate from a surgical standpoint.  LOS: 36 days    Debby LABOR Giani Betzold 09/04/2023

## 2023-09-04 NOTE — Progress Notes (Signed)
 Patient discharged to home with family at this time.  Family had thorough teaching on drain care and dressing changes with stated understanding.

## 2023-09-04 NOTE — Plan of Care (Signed)
  Problem: Health Behavior/Discharge Planning: Goal: Ability to manage health-related needs will improve Outcome: Progressing   Problem: Clinical Measurements: Goal: Will remain free from infection Outcome: Progressing   Problem: Coping: Goal: Level of anxiety will decrease Outcome: Progressing   Problem: Pain Managment: Goal: General experience of comfort will improve and/or be controlled Outcome: Progressing

## 2023-09-04 NOTE — TOC Progression Note (Signed)
 Transition of Care [facility]) - Progression Note    Patient Details  Name: Angela [name] MRN: [id number] Date of Birth: [birth year]  [facility] CM/SW Contact  Robynn Eileen Hoose, RN Phone Number: 09/04/2023, 10:49 AM  Clinical Narrative:   Message to [facility] regarding DME equipment delivery. Per [place] DME can be delivered today.                      Expected Discharge Plan and Services                                               Social Drivers of Health (SDOH) Interventions SDOH Screenings   Food Insecurity: Patient Declined (08/02/2023)  Housing: Unknown (08/02/2023)  Transportation Needs: Patient Declined (08/02/2023)  Utilities: Patient Declined (08/02/2023)  Alcohol Screen: Low Risk  (04/19/2023)  Depression (PHQ2-9): Low Risk  (06/23/2023)  Financial Resource Strain: Patient Declined (04/19/2023)  Physical Activity: Sufficiently Active (04/19/2023)  Social Connections: Patient Declined (08/02/2023)  Stress: No Stress Concern Present (04/19/2023)  Tobacco Use: Medium Risk (07/30/2023)  Health Literacy: Adequate Health Literacy (04/19/2023)    Readmission Risk Interventions     No data to display

## 2023-09-04 NOTE — Progress Notes (Signed)
 Occupational Therapy Treatment Patient Details Name: NIKKOL PAI MRN: [id number] DOB: 1950/03/31 Today's Date: 09/04/2023   History of present illness 73 yo female presents to ED on 6/21 with abdominal pain. Workup for sepsis secondary to bowel perforation. S/p sigmoidectomy with end colostomy 6/22. 6/29 increased WBC and abd pain, s/p CT guided drain placement for pelvic abscess 6/30, repeat drain placement RLQ and R transgluteal pelvic drain 7/7. Pt also with hydroureteronephrosis secondary to L ureteral stone, s/p L ureteral stent placement and L retrograde pyelogram 7/11. PMH includes  HTN, HLD, glaucoma and prior tobacco abuse.   OT comments  Patient continues to demonstrate good gains with OT treatment. Patient is Mod I for bed mobility and able to perform self care tasks with supervision seated and standing. Patient performed mobility in hallway with IV for support but patient states not necessary. Discharge recommendations continue to be appropriate. Acute OT to continue to follow to address established goals.       If plan is discharge home, recommend the following:  A little help with bathing/dressing/bathroom;Assistance with cooking/housework   Equipment Recommendations  Tub/shower seat    Recommendations for Other Services      Precautions / Restrictions Precautions Precautions: Fall;Other (comment) Recall of Precautions/Restrictions: Intact Precaution/Restrictions Comments: JP drain and ostomy bag Restrictions Weight Bearing Restrictions Per Provider Order: No       Mobility Bed Mobility Overal bed mobility: Modified Independent Bed Mobility: Supine to Sit     Supine to sit: Modified independent (Device/Increase time)     General bed mobility comments: patient left sitting on EOB at end of session    Transfers Overall transfer level: Needs assistance Equipment used: None Transfers: Sit to/from Stand Sit to Stand: Supervision           General  transfer comment: no physical assist needed     Balance Overall balance assessment: Needs assistance Sitting-balance support: Feet supported Sitting balance-Leahy Scale: Good     Standing balance support: During functional activity, No upper extremity supported, Single extremity supported Standing balance-Leahy Scale: Fair Standing balance comment: stood at sink with no UE support, IV pole with mobility                           ADL either performed or assessed with clinical judgement   ADL Overall ADL's : Needs assistance/impaired Eating/Feeding: Independent;Sitting Eating/Feeding Details (indicate cue type and reason): on EOB Grooming: Wash/dry hands;Wash/dry face;Oral care;Brushing hair;Supervision/safety;Standing Grooming Details (indicate cue type and reason): at sink         Upper Body Dressing : Sitting;Set up Upper Body Dressing Details (indicate cue type and reason): gown for back Lower Body Dressing: Supervision/safety;Sitting/lateral leans Lower Body Dressing Details (indicate cue type and reason): donned socks with figure four Toilet Transfer: Supervision/safety;Regular Teacher, adult education Details (indicate cue type and reason): able to perform toilet transfer without walker Toileting- Clothing Manipulation and Hygiene: Modified independent;Sit to/from stand       Functional mobility during ADLs: Supervision/safety      Extremity/Trunk Assessment              Vision       Perception     Praxis     Communication Communication Communication: No apparent difficulties   Cognition Arousal: Alert Behavior During Therapy: WFL for tasks assessed/performed Cognition: No apparent impairments  Following commands: Intact        Cueing   Cueing Techniques: Verbal cues  Exercises      Shoulder Instructions       General Comments VSS    Pertinent Vitals/ Pain       Pain Assessment Pain  Assessment: Faces Pain Location: abdomen, JP drain site Pain Descriptors / Indicators: Discomfort, Guarding, Grimacing Pain Intervention(s): Monitored during session, Repositioned, RN gave pain meds during session  Home Living                                          Prior Functioning/Environment              Frequency  Min 2X/week        Progress Toward Goals  OT Goals(current goals can now be found in the care plan section)  Progress towards OT goals: Progressing toward goals  Acute Rehab OT Goals Patient Stated Goal: to go home OT Goal Formulation: With patient Time For Goal Achievement: 09/05/23 Potential to Achieve Goals: Good ADL Goals Pt Will Perform Grooming: with modified independence Pt Will Perform Lower Body Dressing: with supervision;sit to/from stand Pt Will Transfer to Toilet: with modified independence Pt Will Perform Toileting - Clothing Manipulation and hygiene: with modified independence Pt Will Perform Tub/Shower Transfer: with modified independence  Plan      Co-evaluation                 AM-PAC OT 6 Clicks Daily Activity     Outcome Measure   Help from another person eating meals?: None Help from another person taking care of personal grooming?: A Little Help from another person toileting, which includes using toliet, bedpan, or urinal?: A Little Help from another person bathing (including washing, rinsing, drying)?: A Little Help from another person to put on and taking off regular upper body clothing?: A Little Help from another person to put on and taking off regular lower body clothing?: A Little 6 Click Score: 19    End of Session Equipment Utilized During Treatment: Other (comment) (IV pole)  OT Visit Diagnosis: Unsteadiness on feet (R26.81);Other abnormalities of gait and mobility (R26.89);Pain;Muscle weakness (generalized) (M62.81) Pain - part of body:  (abdomen)   Activity Tolerance Patient tolerated  treatment well;Patient limited by pain   Patient Left in bed;with call bell/phone within reach;with family/visitor present (seated on EOB)   Nurse Communication Mobility status        Time: 9178-9143 OT Time Calculation (min): 35 min  Charges: OT General Charges $OT Visit: 1 Visit OT Treatments $Self Care/Home Management : 8-22 mins $Therapeutic Activity: 8-22 mins  Dick Laine, OTA Acute Rehabilitation Services  Office [phone]   Jeb LITTIE Laine 09/04/2023, 11:39 AM

## 2023-09-04 NOTE — Discharge Summary (Signed)
 Physician Discharge Summary  Angela [name] FMW:[id number] DOB: 09/30/1950 DOA: 07/30/2023  PCP: Catherine Charlies LABOR, DO  Admit date: 07/30/2023 Discharge date: 09/04/2023  Time spent: 40 minutes  Recommendations for Outpatient Follow-up:  Follow outpatient CBC/CMP  Follow with IR for drain management outpatient Follow with general surgery outpatient  Follow with urology outpatient    Discharge Diagnoses:  Principal Problem:   Bowel perforation (HCC) Active Problems:   Hydroureteronephrosis   Chronic idiopathic constipation   Metabolic acidosis with increased anion gap and accumulation of organic acids   Hyponatremia   Essential hypertension   Glaucoma   Perforated sigmoid colon (HCC)   Protein-calorie malnutrition, severe   Discharge Condition: stable  Diet recommendation: heart healthy  Filed Weights   08/27/23 0500 08/31/23 0522 09/02/23 0500  Weight: 59.1 kg 55.7 kg 56.6 kg    History of present illness:   Patient is Angela [name] 73 y.o.  female with history of HTN, HLD who presented with worsening abdominal pain-patient was found to have sigmoid perforation with feculent peritonitis in the setting of stercoral colitis.  Patient underwent sigmoidectomy with end colostomy on 6/22-unfortunately postop course complicated by ileus and pelvic residual abscesses requiring IR drain placement.  Hospitalization also complicated by distal ureteral stone with hydronephrosis, now s/p stent placement.  She's stable for discharge on 7/28 with outpatient IR and surgery and urology follow up.     Significant events: 6/21>> evaluated in the ED for abdominal pain-found to have left mid ureteral stone-9 mm with hydronephrosis-and large volume of stool-managed with supportive care and discharged home. 6/22>> worsening abdominal pain-repeat CT scan consistent with bowel perforation-surgery consult-admit to [facility]. 6/29>> CT abdomen with pelvic abscess. 6/30>> CT-guided drain and pelvic abscess by IR 7/04>>  vomiting-recurrent ileus-NG tube reinserted 7/05>> TNA started-repeat CT abdomen increased size of abscess. 7/07>> IR placed RLQ drain and right transgluteal pelvic drain. 7/10>> NGT removed 7/11 left ureteral obstructing stone, s/p cystoscopy with L ureteral stent placement 7/18 right lower quadrant drainage catheter removed, right transgluteal approach drainage cathter left in place 7/23 repositioning of slightly displaced percutaneous drainage cathter 7/27 TPN discontinued    See below and previous notes for additional details   Hospital Course:  Assessment and Plan:  Sepsis secondary to stercoral colitis with ischemia/perforation of sigmoid colon with feculent peritonitis S/p ex lap sigmoidectomy with end colostomy 6/22  complicated by postoperative ileus and then residual pelvic abscess requiring CT-guided drain placement by IR General surgery and IR following S/p repositioning of slightly displaced percutaneous drainage catheter within the center of the residual fluid collection on 7/23 Will need surgery follow up - dressing changes per surgery recs IR recommending follow up in 10-14 days - flush drain daily with 5 cc NS, record output every day, dressing changes every 2-3 days or earlier if soiled  Surgery recommending BID dressing changes to midline Her PO intake has improved, stable for discharge Abscess RLQ growing proteus mirabilis, enterococcus faecalis, bacteroides thetaiotaomicron, beta lactamase positive Abx have been completed as of 7/20, appreciate surgery recs   Hydronephrosis secondary to distal left ureteral stone Urology following-with recommendations for supportive care for now-now that her medical problems have more or less stabilized underwent left ureteral stent placement by urology Dr. Arlyss Foot on 08/18/2023 Plan for follow up after discharge, they're going to work on follow up (7/24)   UTI Citrobacter, e. Coli, and proteus - all sensitive to bactrim .  Has  had 4 days of ceftriaxone .  Will complete course with bactrim  x1  more days.   Hospital delirium Secondary to acute illness/narcotics resolved   Normocytic anemia Due to acute illness,+/- coffee-ground emesis on 7/7 S/p 2 units pRBC 7/20 Hb remains stable on discharge, follow outpatinet   Thrombocytosis Resolved  Coffee-ground NG tube emesis Continue PPI at discharge   Hypomagnesemia/hypophosphatemia/hypokalemia resolved   HTN Coreg , lisinopril   Will monitor    Lower extremity edema Secondary to hypoalbuminemia/IV fluid resuscitation in the setting of acute illness Echo with preserved EF-UA negative for proteinuria Improved - follow outpatient - no needs for diuretics at this time   Chronic idiopathic constipation Bowel function recovering-brown stools - good output for now   Odynophagia secondary to oral/aphthous ulcers Resolved Outpatient follow up   Glaucoma Timolol  eyedrops   Insomnia Follow outpatient    Debility/deconditioning PT OT eval-Home health recommended.     Procedures: 6/22  Sigmoidectomy with end colostomy    6/30 CT guided drain placement    7/7 CT guided placement of RLQ drain and right transgluteal pelvic drain    7/11 1.  Cystoscopy with left ureteral stent placement 2. left retrograde pyelogram with interpretation 3. Fluoroscopy <1 hour with intraoperative interpretation  7/17 IMPRESSION: 1. No significant residual abscess collection or obvious fistulous communication. 2. The RIGHT lower quadrant drainage catheter was removed. 3. The RIGHT transgluteal approach drainage catheter was left in place, secondary to residual output volume. No significant abscess collection or obvious fistulous communication noted on fluoroscopic evaluation.  7/23 IMPRESSION: 1. Repositioning of the slightly displaced percutaneous drainage catheter within the center of the residual fluid collection. 2. No definite fistula identified. 3.  Continue percutaneous catheter drainage and routine drain management.  Echo IMPRESSIONS     1. Left ventricular ejection fraction, by estimation, is 60 to 65%. The  left ventricle has normal function. The left ventricle has no regional  wall motion abnormalities. There is mild concentric left ventricular  hypertrophy. Left ventricular diastolic  parameters were normal.   2. Right ventricular systolic function is normal. The right ventricular  size is normal.   3. The mitral valve is normal in structure. Trivial mitral valve  regurgitation. No evidence of mitral stenosis.   4. The aortic valve is normal in structure. Aortic valve regurgitation is  not visualized. No aortic stenosis is present.   5. The inferior vena cava is normal in size with greater than 50%  respiratory variability, suggesting right atrial pressure of 3 mmHg.    Consultations: IR Surgery urology  Discharge Exam: Vitals:   09/03/23 2350 09/04/23 0556  BP: 121/62 (!) 156/86  Pulse: 69 70  Resp: 14 18  Temp: 97.7 F (36.5 C) 98.7 F (37.1 C)  SpO2: 94% 95%   No new complaints Stable abdominal discomfort Eager to discharge if possible Husband at bedside - didn't directly say this to me, but implied to [facility] that we're discharging her too early  General: No acute distress. Cardiovascular: RRR Lungs: unlabored Abdomen: midline dressing, CDI - ostomy with green stool, stable abdominal discomfort JP with purulent output  Neurological: Alert and oriented 3. Moves all extremities 4 with equal strength. Cranial nerves II through XII grossly intact. Skin: Warm and dry. No rashes or lesions. Extremities: No clubbing or cyanosis. No edema.   Discharge Instructions   Discharge Instructions     Call MD for:  difficulty breathing, headache or visual disturbances   Complete by: As directed    Call MD for:  extreme fatigue   Complete by: As directed  Call MD for:  hives   Complete by: As directed     Call MD for:  persistant dizziness or light-headedness   Complete by: As directed    Call MD for:  persistant nausea and vomiting   Complete by: As directed    Call MD for:  redness, tenderness, or signs of infection (pain, swelling, redness, odor or green/yellow discharge around incision site)   Complete by: As directed    Call MD for:  severe uncontrolled pain   Complete by: As directed    Call MD for:  temperature >100.4   Complete by: As directed    Diet - low sodium heart healthy   Complete by: As directed    Discharge instructions   Complete by: As directed    You were admitted with colitis.  You had surgery with sigmoidectomy and end colostomy.  Continue wound care per general surgery recommendations (twice daily dressing changes).  You had pelvic abscesses that have required drainage by interventional radiology.    You'll discharge with the drain in place.  You'll need follow up with interventional radiology within 10-14 days of discharge for repeat imaging.  Flush the drain with 5 cc of normal saline daily.  Record the output daily.  Change the dressing every 2-3 days or earlier if soiled.  You've almost completed Vika Buske course of antibiotics for your urine infection.  You have 1 more day of antibiotics (2 doses of bactrim  that will start 7/29).  You had Antwain Caliendo stent placed on 7/11 for your kidney stone.  They'll arrange follow up with you within 1-2 weeks of discharge.  If you don't hear from them, please call [facility] to arrange an appointment.  Return for new, recurrent, or worsening symptoms.  Please ask your PCP to request records from this hospitalization so they know what was done and what the next steps will be.   Discharge wound care:   Complete by: As directed    BID saline wet to dry dressing to midline wound   Increase activity slowly   Complete by: As directed       Allergies as of 09/04/2023       Reactions   Codeine Other (See Comments)   Irritable ,  insomnia         Medication List     STOP taking these medications    amoxicillin -clavulanate 875-125 MG tablet Commonly known as: AUGMENTIN    HYDROcodone -acetaminophen  5-325 MG tablet Commonly known as: NORCO/VICODIN   hydrocortisone  25 MG suppository Commonly known as: ANUSOL -HC   hydrocortisone  cream 1 %   Linzess 72 MCG capsule Generic drug: linaclotide       TAKE these medications    acetaminophen  500 MG tablet Commonly known as: TYLENOL  Take 2 tablets (1,000 mg total) by mouth every 6 (six) hours as needed.   albuterol  108 (90 Base) MCG/ACT inhaler Commonly known as: VENTOLIN  HFA Inhale 2 puffs into the lungs every 6 (six) hours as needed for wheezing or shortness of breath.   atorvastatin  20 MG tablet Commonly known as: LIPITOR Take 1 tablet (20 mg total) by mouth at bedtime.   carvedilol  6.25 MG tablet Commonly known as: COREG  Take 1 tablet (6.25 mg total) by mouth 2 (two) times daily with Tycho Cheramie meal.   DAILY PROBIOTIC PO Take 1 capsule by mouth daily at 12 noon.   ferrous sulfate  325 (65 FE) MG tablet Take 1 tablet (325 mg total) by mouth every other day. Follow your iron and  anemia with your PCP outpatient   FIBER-CAPS PO Take 1 capsule by mouth daily at 12 noon.   latanoprost  0.005 % ophthalmic solution Commonly known as: XALATAN  Place 1 drop into both eyes at bedtime.   lisinopril  10 MG tablet Commonly known as: ZESTRIL  TAKE 1 TABLET BY MOUTH DAILY   multivitamin tablet Take 1 tablet by mouth daily.   ondansetron  4 MG disintegrating tablet Commonly known as: ZOFRAN -ODT Take 1 tablet (4 mg total) by mouth every 8 (eight) hours as needed for nausea or vomiting.   oxyCODONE  5 MG immediate release tablet Commonly known as: Roxicodone  Take 1 tablet (5 mg total) by mouth every 6 (six) hours as needed for up to 5 days.   pantoprazole  40 MG tablet Commonly known as: PROTONIX  Take 1 tablet (40 mg total) by mouth daily.   polyethylene glycol  powder 17 GM/SCOOP powder Commonly known as: GLYCOLAX /MIRALAX  Take 17 g by mouth daily.   sodium chloride  0.9 % injection 5 mLs by Intracatheter route daily for 14 days. Flush drain with 5 cc daily.  Record output.   sulfamethoxazole -trimethoprim  800-160 MG tablet Commonly known as: Bactrim  DS Take 1 tablet by mouth 2 (two) times daily for 1 day. Your last doses of antibiotics for your urine infection will be on 7/29. Start taking on: September 05, 2023   timolol  0.5 % ophthalmic solution Commonly known as: TIMOPTIC  Place 1 drop into both eyes 2 (two) times daily.               Durable Medical Equipment  (From admission, onward)           Start     Ordered   09/04/23 1122  DME 3-in-1  Once        09/04/23 1122   09/04/23 1121  DME Walker  Once       Question Answer Comment  Walker: With 5 Inch Wheels   Patient needs Julion Gatt walker to treat with the following condition Physical deconditioning      09/04/23 1122   08/30/23 1142  For home use only DME Walker rolling  Once       Question Answer Comment  Walker: With 5 Inch Wheels   Patient needs Jehad Bisono walker to treat with the following condition Impaired functional mobility, balance, gait, and endurance   Patient needs Teneka Malmberg walker to treat with the following condition Weakness      08/30/23 1141   08/30/23 1141  For home use only DME Hospital bed  Once       Comments: Therapeutic mattress  Question Answer Comment  Length of Need 6 Months   Patient has (list medical condition): debility, weakness   The above medical condition requires: Patient requires the ability to reposition frequently   Head must be elevated greater than: 30 degrees   Bed type Semi-electric      08/30/23 1141   08/30/23 1140  For home use only DME Bedside commode  Once       Comments: Will stay in room that does not have Milan Perkins toilet  Question Answer Comment  Patient needs Jayr Lupercio bedside commode to treat with the following condition Balance problem   Patient needs Audi Wettstein  bedside commode to treat with the following condition Weakness      08/30/23 1140              Discharge Care Instructions  (From admission, onward)           Start  Ordered   09/04/23 0000  Discharge wound care:       Comments: BID saline wet to dry dressing to midline wound   09/04/23 1122           Allergies  Allergen Reactions   Codeine Other (See Comments)    Irritable , insomnia     Follow-up Information     [facility] Follow up.   Specialty: Home Health Services Why: Physical therapy. Occupational therapy, Nursing services, Home Health Aide. They will call you to set up services Contact information: [address] [place] [address] [phone]         [facility] Follow up.   Why: For DME Contact information: [address] [place] [address] [phone]         [facility] Follow up.   Why: Please follow up with IR in approximately 2 weeks for Nekayla Heider drain evaluation. Hermie Reagor scheduler from our office will call you with Takeia Ciaravino date/time of your appointment. Contact information: [address] [place] [address] [phone]         Catherine Fuller Lynze Reddy, DO Follow up.   Specialty: Family Medicine Why: call for Ahnesty Finfrock follow up appointment Contact information: [address] [place] [address] [phone]         [facility] Follow up.   Why: you should get Kahne Helfand phone call about follow up to manage your stent, if you don't hear from them, call to schedule an appointment Contact information: [address] [place]  [address] [phone]        Polly Cordella LABOR, MD Follow up.   Specialty: General Surgery Why: call for Jaqlyn Gruenhagen follow up appointment Contact information: [address] [place] [address] [phone]                  The results of significant diagnostics from this  hospitalization (including imaging, microbiology, ancillary and laboratory) are listed below for reference.    Significant Diagnostic Studies: IR Catheter Tube Change Result Date: 08/30/2023 INDICATION: Clinical concern for for cutaneous drainage catheter mild dysfunction/malposition EXAM: IR CATHETER TUBE CHANGE MEDICATIONS: The patient is currently admitted to the hospital and receiving intravenous antibiotics. The antibiotics were administered within an appropriate time frame prior to the initiation of the procedure. ANESTHESIA/SEDATION: Fentanyl  1 mcg IV; Versed  50 mg IV Moderate Sedation Time:  15 The patient was continuously monitored during the procedure by the interventional radiology nurse under my direct supervision. COMPLICATIONS: None immediate. PROCEDURE: Informed written consent was obtained from the patient after Keaja Reaume thorough discussion of the procedural risks, benefits and alternatives. All questions were addressed. Maximal Sterile Barrier Technique was utilized including caps, mask, sterile gowns, sterile gloves, sterile drape, hand hygiene and skin antiseptic. Jahne Krukowski timeout was performed prior to the initiation of the procedure. The drain site was prepped and draped in usual sterile fashion. Tyrees Chopin gentle hand injection of contrast agent was performed which demonstrated filling of Demontez Novack residual cavity just adjacent to the percutaneous drainage catheter. Colburn Asper definite fistula was not seen. Next, the existing drainage catheter was removed over Saory Carriero guidewire. Gladyse Corvin new 10 French percutaneous drain was placed more cranially into the central portion of the residual collection. This was irrigated and an additional 20 cc of material were collected. The tube was then secured in place using graded suture material and placed to bulb suction drainage. IMPRESSION: 1. Repositioning  of the slightly displaced percutaneous drainage catheter within the center of the residual fluid collection. 2. No definite fistula identified. 3.  Continue percutaneous catheter drainage and routine drain management. Electronically Signed   By: Maude Naegeli M.D.   On: 08/30/2023 11:29   CT ABDOMEN PELVIS W CONTRAST Result Date: 08/29/2023 CLINICAL DATA:  Follow-up drainage. EXAM: CT ABDOMEN AND PELVIS WITH CONTRAST TECHNIQUE: Multidetector CT imaging of the abdomen and pelvis was performed using the standard protocol following bolus administration of intravenous contrast. RADIATION DOSE REDUCTION: This exam was performed according to the departmental dose-optimization program which includes automated exposure control, adjustment of the mA and/or kV according to patient size and/or use of iterative reconstruction technique. CONTRAST:  75mL OMNIPAQUE  IOHEXOL  350 MG/ML SOLN COMPARISON:  CT abdomen pelvis dated 08/23/2023. FINDINGS: Lower chest: Bibasal subpleural atelectasis. There is coronary vascular calcification. No intra-abdominal free air.  Trace subhepatic free fluid. Hepatobiliary: Small cyst in the left lobe of the liver. No biliary dilatation. The gallbladder is unremarkable. Pancreas: Unremarkable. No pancreatic ductal dilatation or surrounding inflammatory changes. Spleen: Normal in size without focal abnormality. Adrenals/Urinary Tract: The adrenal glands are unremarkable. Mild left hydronephrosis, progressed since the prior CT. Left ureteral stent with proximal tip in the interpolar collecting system and distal end in the urinary bladder. There is Wanya Bangura 1 cm stone in the distal left ureter along the catheter is similar location as the prior CT. No hydronephrosis on the right. Small bilateral renal cysts or dilated calyx. The urinary bladder is grossly unremarkable. Stomach/Bowel: Postsurgical changes of the bowel with Yon Schiffman left anterior colostomy. Mild dilatation of several small bowel loops measure up to approximately 3 cm. Dinia Joynt gradual transition in the anterior right pelvis noted. Findings favor to represent an ileus. Slate Debroux small-bowel obstruction secondary  to peritoneal adhesions is considered less likely. Small-bowel series may provide better evaluation if clinically indicated. The appendix is unremarkable as visualized. Vascular/Lymphatic: Advanced aortoiliac atherosclerotic disease. The IVC is unremarkable. No portal venous gas. There is no adenopathy. Reproductive: The uterus is grossly unremarkable. No suspicious adnexal masses. Other: Right transgluteal pigtail drainage catheter with tip in the posterior pelvis. There is Taiwan Millon 5.3 x 2.9 cm loculated collection just superior to the pigtail tip of the catheter in the posterior pelvis, relatively similar to prior CT. There has been interval removal of the right anterior percutaneous drainage catheter. Musculoskeletal: Degenerative changes of the spine. No acute osseous pathology. Midline vertical anterior pelvic wall open wound. IMPRESSION: 1. Right transgluteal pigtail drainage catheter with tip in the posterior pelvis. Raynold Blankenbaker 5.3 x 2.9 cm loculated collection just superior to the pigtail tip of the catheter in the posterior pelvis, relatively similar to prior CT. 2. Mild left hydronephrosis, progressed since the prior CT. Left ureteral stent in place. Maitlyn Penza 1 cm stone in the distal left ureter along the catheter is similar location as the prior CT. 3. Postsurgical changes of the bowel with Tationa Stech left anterior colostomy. Mild dilatation of several small bowel loops favor to represent an ileus. Shanen Norris small-bowel obstruction secondary to peritoneal adhesions is considered less likely. Small-bowel series may provide better evaluation if clinically indicated. 4.  Aortic Atherosclerosis (ICD10-I70.0). Electronically Signed   By: Vanetta Chou M.D.   On: 08/29/2023 14:33   DG Abd Portable 1V Result Date: 08/29/2023 CLINICAL DATA:  Small bowel obstruction. EXAM: PORTABLE ABDOMEN - 1 VIEW COMPARISON:  08/28/2023 FINDINGS: NG tube tip is positioned in the proximal stomach. Side port of the NG tube is in the region  of the GE junction in  tube could be advanced 3-4 cm to ensure side port placement below the GE junction as clinically warranted. Left internal ureteral stent device again noted. Percutaneous pigtail drain overlies the right abdomen and pelvis with formed loop overlying the central upper pelvis. Diffuse gaseous bowel distension noted with some small bowel loops measuring up to 3.6 cm diameter. IMPRESSION: NG tube tip is positioned in the proximal stomach. Side port of the NG tube is in the region of the GE junction and tube could be advanced 3-4 cm to ensure side port placement below the GE junction as clinically Diffuse gaseous bowel distension. Electronically Signed   By: Camellia Candle M.D.   On: 08/29/2023 07:02   DG Abd Portable 1V Result Date: 08/28/2023 CLINICAL DATA:  73 year old female status post exploratory laparotomy. Abdominal pain. Small-bowel obstruction. EXAM: PORTABLE ABDOMEN - 1 VIEW COMPARISON:  Abdomen radiographs yesterday and earlier. FINDINGS: Portable AP supine view at 0724 hours. Stable enteric tube, left double-J ureteral stent, right percutaneous pelvic drain. Non obstructed bowel gas pattern. Stable visualized osseous structures. Negative lung bases. IMPRESSION: 1. Non obstructed bowel gas pattern. 2.  Stable lines and tubes. Electronically Signed   By: VEAR Hurst M.D.   On: 08/28/2023 07:44   DG Abd Portable 1V Result Date: 08/27/2023 CLINICAL DATA:  73 year old female status post exploratory laparotomy. Abdominal pain. Small-bowel obstruction. EXAM: PORTABLE ABDOMEN - 1 VIEW COMPARISON:  Abdominal radiographs yesterday and earlier. FINDINGS: Portable AP supine view at 0636 hours. Stable lines and tubes in the abdomen and pelvis. Negative lung bases. Stable visualized osseous structures. Oral contrast previously in the right colon has continued distally. Mild left abdominal small bowel gas use distension now, 3.7 cm. Bowel-gas pattern remains improved compared to the CT scout view on 08/23/2023. no definite  pneumoperitoneum on these supine views. IMPRESSION: 1. Large bowel oral contrast has continued distally. Mildly dilated left abdominal small bowel loops, but gas pattern still improved compared to recent CT Abdomen and Pelvis. 2. Stable lines and tubes. Electronically Signed   By: VEAR Hurst M.D.   On: 08/27/2023 06:59   DG Abd Portable 1V Result Date: 08/26/2023 CLINICAL DATA:  73 year old female status post exploratory laparotomy. Abdominal pain. Small-bowel obstruction. EXAM: PORTABLE ABDOMEN - 1 VIEW COMPARISON:  Abdominal radiographs yesterday, postoperative CT Abdomen and Pelvis 08/23/2023. FINDINGS: Portable AP supine views at 0629 hours. Satisfactory enteric tube termination in the stomach now, side hole at the gastric cardia level. Stable left double-J ureteral stent. Stable right pelvic pigtail drain. Bowel-gas pattern appears improved compared to the scout view on 08/23/2023. Oral contrast in the right colon similar to the radiographs yesterday. Stable, negative lung bases. Stable visualized osseous structures. IMPRESSION: 1. Satisfactory enteric tube placement into the stomach. Otherwise stable lines and tubes. 2. Improving bowel gas pattern since the CT on 08/23/2023. Stable oral contrast in the right colon. Electronically Signed   By: VEAR Hurst M.D.   On: 08/26/2023 07:19   CT GUIDED PERITONEAL/RETROPERITONEAL FLUID DRAIN BY PERC CATH Addendum Date: 08/25/2023 ADDENDUM REPORT: 08/25/2023 13:50 ADDENDUM: This procedure was performed on 08/14/2023. Electronically Signed   By: Juliene Balder M.D.   On: 08/25/2023 13:50   Result Date: 08/25/2023 INDICATION: 73 year old with history of exploratory laparotomy with Hartmann's procedure for stercoral colitis with ischemia perforation. Patient has postoperative fluid collections. Previously placed right transgluteal drain has retracted and no longer appropriately positioned. EXAM: 1. CT-guided placement of right lower quadrant abdominal abscess drain 2.  CT-guided placement of right transgluteal pelvic drain TECHNIQUE: Multidetector CT imaging of the abdomen and pelvis was performed following the standard protocol without IV contrast. RADIATION DOSE REDUCTION: This exam was performed according to the departmental dose-optimization program which includes automated exposure control, adjustment of the mA and/or kV according to patient size and/or use of iterative reconstruction technique. MEDICATIONS: The patient is currently admitted to the hospital and receiving intravenous antibiotics. The antibiotics were administered within an appropriate time frame prior to the initiation of the procedure. ANESTHESIA/SEDATION: Moderate (conscious) sedation was employed during this procedure. Virgil Slinger total of Versed  3.5 mg and Fentanyl  175 mcg was administered intravenously by the radiology nurse. Total intra-service moderate Sedation Time: 83 minutes. The patient's level of consciousness and vital signs were monitored continuously by radiology nursing throughout the procedure under my direct supervision. COMPLICATIONS: None immediate. PROCEDURE: Informed written consent was obtained from the patient after Carols Clemence thorough discussion of the procedural risks, benefits and alternatives. All questions were addressed. Maximal Sterile Barrier Technique was utilized including caps, mask, sterile gowns, sterile gloves, sterile drape, hand hygiene and skin antiseptic. Little Bashore timeout was performed prior to the initiation of the procedure. Supine images were obtained of the abdomen and pelvis. Large fluid collection in the right lower quadrant was targeted. Right lower abdomen was prepped with chlorhexidine  and sterile field was created. Skin was anesthetized using 1% lidocaine . Small incision was made. Using CT guidance, an 18 gauge trocar needle was directed into the right lower quadrant fluid collection. Tan colored fluid was aspirated. Superstiff Amplatz wire was placed. Tract was dilated to accommodate  Vern Prestia 12 [place] multipurpose drain. Greater than 400 mL tan colored fluid was removed from this drain. The drain was pulled back into the right lower quadrant in order to optimize drainage. Follow up CT images were obtained. Drain was sutured to skin and attached to Queena Monrreal suction bulb. Patient was rolled onto her left side and additional CT images were obtained. The existing transgluteal drain was cut and completely removed. The right buttock was prepped with chlorhexidine  and sterile field was created. Skin was anesthetized using 1% lidocaine . Small incision was made. Using CT guidance, 18 gauge trocar needle was directed into the pelvic fluid collection via Hamdan Toscano transgluteal approach. Tan colored fluid was aspirated. Superstiff Amplatz wire was placed. The tract was dilated to accommodate Shephanie Romas 12 [place] multipurpose drain. 100 mL of tan colored fluid was removed. Drain was sutured to skin and attached to Sebron Mcmahill suction bulb. FINDINGS: CT demonstrated multiple fluid collections in lower abdomen and pelvis. Large collections were in the right lower quadrant and in the pelvis. The existing transgluteal drain has retracted internally and no longer within the pelvic collection. Right lower quadrant drain was placed and greater than 400 mL of tan colored fluid was removed. New right transgluteal pelvic drain was placed. 100 mL of tan colored fluid was removed. IMPRESSION: 1. CT-guided placement of Veta Dambrosia right lower quadrant abdominal abscess drain. 2. Removal of the old right transgluteal pelvic drain. 3. CT-guided placement of Alynna Hargrove new right transgluteal pelvic drain. Electronically Signed: By: Juliene Balder M.D. On: 08/15/2023 10:11   CT GUIDED PERITONEAL/RETROPERITONEAL FLUID DRAIN BY PERC CATH Addendum Date: 08/25/2023 ADDENDUM REPORT: 08/25/2023 13:50 ADDENDUM: This procedure was performed on 08/14/2023. Electronically Signed   By: Juliene Balder M.D.   On: 08/25/2023 13:50   Result Date: 08/25/2023 INDICATION: 73 year old with history of  exploratory laparotomy with Hartmann's procedure for stercoral colitis with ischemia perforation. Patient has postoperative fluid collections.  Previously placed right transgluteal drain has retracted and no longer appropriately positioned. EXAM: 1. CT-guided placement of right lower quadrant abdominal abscess drain 2. CT-guided placement of right transgluteal pelvic drain TECHNIQUE: Multidetector CT imaging of the abdomen and pelvis was performed following the standard protocol without IV contrast. RADIATION DOSE REDUCTION: This exam was performed according to the departmental dose-optimization program which includes automated exposure control, adjustment of the mA and/or kV according to patient size and/or use of iterative reconstruction technique. MEDICATIONS: The patient is currently admitted to the hospital and receiving intravenous antibiotics. The antibiotics were administered within an appropriate time frame prior to the initiation of the procedure. ANESTHESIA/SEDATION: Moderate (conscious) sedation was employed during this procedure. Shyra Emile total of Versed  3.5 mg and Fentanyl  175 mcg was administered intravenously by the radiology nurse. Total intra-service moderate Sedation Time: 83 minutes. The patient's level of consciousness and vital signs were monitored continuously by radiology nursing throughout the procedure under my direct supervision. COMPLICATIONS: None immediate. PROCEDURE: Informed written consent was obtained from the patient after Tarin Navarez thorough discussion of the procedural risks, benefits and alternatives. All questions were addressed. Maximal Sterile Barrier Technique was utilized including caps, mask, sterile gowns, sterile gloves, sterile drape, hand hygiene and skin antiseptic. Giani Betzold timeout was performed prior to the initiation of the procedure. Supine images were obtained of the abdomen and pelvis. Large fluid collection in the right lower quadrant was targeted. Right lower abdomen was prepped with  chlorhexidine  and sterile field was created. Skin was anesthetized using 1% lidocaine . Small incision was made. Using CT guidance, an 18 gauge trocar needle was directed into the right lower quadrant fluid collection. Tan colored fluid was aspirated. Superstiff Amplatz wire was placed. Tract was dilated to accommodate Genella Bas 12 [place] multipurpose drain. Greater than 400 mL tan colored fluid was removed from this drain. The drain was pulled back into the right lower quadrant in order to optimize drainage. Follow up CT images were obtained. Drain was sutured to skin and attached to Macel Yearsley suction bulb. Patient was rolled onto her left side and additional CT images were obtained. The existing transgluteal drain was cut and completely removed. The right buttock was prepped with chlorhexidine  and sterile field was created. Skin was anesthetized using 1% lidocaine . Small incision was made. Using CT guidance, 18 gauge trocar needle was directed into the pelvic fluid collection via Amina Menchaca transgluteal approach. Tan colored fluid was aspirated. Superstiff Amplatz wire was placed. The tract was dilated to accommodate Javarri Segal 12 [place] multipurpose drain. 100 mL of tan colored fluid was removed. Drain was sutured to skin and attached to Simeon Vera suction bulb. FINDINGS: CT demonstrated multiple fluid collections in lower abdomen and pelvis. Large collections were in the right lower quadrant and in the pelvis. The existing transgluteal drain has retracted internally and no longer within the pelvic collection. Right lower quadrant drain was placed and greater than 400 mL of tan colored fluid was removed. New right transgluteal pelvic drain was placed. 100 mL of tan colored fluid was removed. IMPRESSION: 1. CT-guided placement of Junie Avilla right lower quadrant abdominal abscess drain. 2. Removal of the old right transgluteal pelvic drain. 3. CT-guided placement of Ona Rathert new right transgluteal pelvic drain. Electronically Signed: By: Juliene Balder M.D. On: 08/15/2023 10:11    IR INJECT INDWELLING DRAINAGE CATHETER Result Date: 08/25/2023 CLINICAL DATA:  Abscess drain follow-up. Briefly, 73 year old female with Daanya Lanphier history of exploratory laparotomy and Hartmann procedure for stercoral colitis, complicated by perforation. RIGHT lower quadrant and transgluteal abscess  drain placement on 08/14/2023 and 08/15/2023. EXAM: IR INJECT/THERA/INC NEEDLE/CATH/PLC EPI/LUMB/SAC W/IMG COMPARISON:  CT AP, 08/23/2023. CONTRAST:  10 mL Isovue -300-administered via the existing percutaneous drain. FLUOROSCOPY: Radiation Exposure Index and estimated peak skin dose (PSD); Reference air kerma (RAK), 0.7 mGy. TECHNIQUE: The RIGHT lower quadrant drain up was removed prior to fluoroscopic evaluation. The patient was positioned prone on the fluoroscopy table. Preciosa Bundrick preprocedural spot fluoroscopic image was obtained of the pelvis and the existing percutaneous drainage catheter. Multiple spot fluoroscopic and radiographic images were obtained following the injection of Tayana Shankle small amount of contrast via the existing RIGHT transgluteal approach percutaneous drainage catheter. Procedure assisted by Tawni Lecher, IR RT under direct supervision of Thom Hall, MD FINDINGS: No significant residual abscess collection or obvious fistulous communication. IMPRESSION: 1. No significant residual abscess collection or obvious fistulous communication. 2. The RIGHT lower quadrant drainage catheter was removed. 3. The RIGHT transgluteal approach drainage catheter was left in place, secondary to residual output volume. No significant abscess collection or obvious fistulous communication noted on fluoroscopic evaluation. Thom Hall, MD [facility]iology Electronically Signed   By: Thom Hall M.D.   On: 08/25/2023 09:36   DG Abd Portable 1V Result Date: 08/25/2023 EXAM: 1 VIEW XRAY OF THE ABDOMEN 08/25/2023 06:38:00 AM COMPARISON: 08/24/2023 CLINICAL HISTORY: [id number] SBO  (small bowel obstruction) (HCC) [id number]. SBO. Pt complains of abdominal pain FINDINGS: BOWEL: Persistent dilated loops of small bowel noted in the left lower quadrant of the abdomen measuring up to 4.1 cm previously 4.0 cm. SOFT TISSUES: No abnormal calcifications or opaque urinary calculi. There is Malahki Gasaway right lower quadrant approach pigtail drainage catheter with distal end in the right paramidline pelvis. BONES: No acute osseous abnormality. LINES AND TUBES: There is an enteric tube with tip approximately 6.2 cm below the GE junction. The side port is 1.5 cm above the GE junction. Recommend advancing enteric tube. Left-sided nephroureteral stent is again noted. IMPRESSION: 1. Persistent dilated loops of small bowel in the left lower quadrant, measuring up to 4.1 cm, previously 4.0 cm, consistent with small bowel obstruction. 2. Enteric tube with tip approximately 6.2 cm below the GE junction and side port 1.5 cm above the GE junction. Recommend advancing enteric tube. 3. Left-sided nephroureteral stent and right lower quadrant approach pigtail drainage catheter with distal end in the right paramidline pelvis. Electronically signed by: Waddell Calk MD 08/25/2023 07:19 AM EDT RP Workstation: [id number]   DG Abd Portable 1V Result Date: 08/24/2023 CLINICAL DATA:  [id number] Encounter for nasogastric (NG) tube placement [id number] EXAM: PORTABLE ABDOMEN - 1 VIEW COMPARISON:  None Available. FINDINGS: Esophagogastric tube terminates at the GE junction. Nonobstructive bowel gas pattern. No pneumoperitoneum. Partially visualized left ureteral stent. No acute fracture or destructive lesion. The lung bases are clear. IMPRESSION: Esophagogastric tube terminates in the region of the GE junction. The last side hole is in the distal esophagus and continued advancement is recommended. Electronically Signed   By: Rogelia Myers M.D.   On: 08/24/2023 11:48   DG Abd Portable 1V Result Date: 08/24/2023 CLINICAL DATA:  Evaluate small  bowel obstruction. History of bowel perforation. EXAM: PORTABLE ABDOMEN - 1 VIEW COMPARISON:  08/23/2023 FINDINGS: There are 2 pigtail drainage catheters within the right paramidline pelvis. Left-sided nephroureteral stent is in place. Persistent dilated small bowel loops are identified within the left hemiabdomen which appears similar to the previous exam measuring up to 4 cm. IMPRESSION: 1. Persistent dilated small bowel loops within the left hemiabdomen compatible  with small bowel obstruction. Electronically Signed   By: Waddell Calk M.D.   On: 08/24/2023 06:55   CT ABDOMEN PELVIS W CONTRAST Result Date: 08/23/2023 CLINICAL DATA:  Postoperative abdominal pain. Exploratory laparotomy with Hartmann's procedure. EXAM: CT ABDOMEN AND PELVIS WITH CONTRAST TECHNIQUE: Multidetector CT imaging of the abdomen and pelvis was performed using the standard protocol following bolus administration of intravenous contrast. RADIATION DOSE REDUCTION: This exam was performed according to the departmental dose-optimization program which includes automated exposure control, adjustment of the mA and/or kV according to patient size and/or use of iterative reconstruction technique. CONTRAST:  75mL OMNIPAQUE  IOHEXOL  350 MG/ML SOLN COMPARISON:  CT abdomen and pelvis 08/12/2023 FINDINGS: Lower chest: There is atelectasis in the lung bases. Hepatobiliary: There is Jaydalyn Demattia cyst in the left lobe of the liver which appears unchanged measuring 15 mm. No new liver lesions are seen. Gallbladder and bile ducts are within normal limits. Pancreas: Unremarkable. No pancreatic ductal dilatation or surrounding inflammatory changes. Spleen: Normal in size without focal abnormality. Adrenals/Urinary Tract: Left ureteral stent in place. There is no hydronephrosis in either kidney. There are rounded hypodensities in both kidneys which are too small to characterize, likely cysts. There is no perinephric fat stranding. Adrenal glands are within normal limits.  There is Stokes Rattigan small amount of air in the bladder. Stomach/Bowel: There are dilated small bowel loops throughout the mid abdomen with air-fluid levels. The stomach is also dilated with large air-fluid level. Transition point is seen in the right lower quadrant adjacent to the anterior abdominal wall image 2/60. Distal small bowel and colon are nondilated. Left lower quadrant colostomy is present. The appendix is within normal limits. Air-fluid levels are also scattered throughout the colon. Vascular/Lymphatic: Aorta and IVC are normal in size. There are atherosclerotic calcifications of the aorta. No enlarged lymph nodes are seen. Reproductive: Uterus and bilateral adnexa are unremarkable. Other: There is no ascites.  There is presacral edema. There is Hadyn Azer percutaneous drainage catheter in the anterior right pelvis. There is no associated fluid collection at the end of this catheter. Posterior sacroiliac approach percutaneous catheter is seen ending in the cul-de-sac. There is no definite fluid collection at the tip of the catheter; however, there is enhancing fluid collection just superior to this catheter containing air in the presacral region measuring 5.8 by 2.3 x 1.2 cm. Enhancing fluid collection containing air in the lateral left and anterior anterior pelvis has significantly decreased in size. The largest portion of the collection is now seen on the left image 2/56 measuring 1.4 by 5.0 cm. No free intraperitoneal air. Musculoskeletal: Degenerative changes affect the spine. IMPRESSION: 1. Small-bowel obstruction with transition point in the right lower quadrant. 2. Left lower quadrant colostomy. 3. Percutaneous drainage catheter in the anterior right pelvis without associated fluid collection. 4. Percutaneous drainage catheter in the posterior sacroiliac region ending in the cul-de-sac. There is no definite fluid collection at the tip of the catheter; however, there is Stanisha Lorenz enhancing fluid collection containing air in  the presacral region measuring 5.8 x 2.3 x 1.2 cm. 5. Enhancing fluid collection containing air in the lateral left and anterior pelvis has significantly decreased in size. The largest portion of the collection is now seen on the left measuring 1.4 x 5.0 cm. 6. Left ureteral stent in place. No hydronephrosis. 7. Small amount of air in the bladder, possibly related to recent instrumentation. Aortic Atherosclerosis (ICD10-I70.0). Electronically Signed   By: Greig Pique M.D.   On: 08/23/2023 17:57  DG Abd Portable 1V Result Date: 08/23/2023 CLINICAL DATA:  Nausea and vomiting EXAM: PORTABLE ABDOMEN - 1 VIEW COMPARISON:  X-ray performed August 12, 2023 FINDINGS: Diffuse gaseous distention of bowel is present with the most distended loops present in the left abdomen, similar. There is been interval placement of an internalized left-sided nephroureteral stent. Additionally, right-sided drainage catheter material has been revised. Degenerative changes are present in the imaged osseous structures. IMPRESSION: 1. Diffuse gaseous distension of the bowel, similar when compared to the prior exam. Electronically Signed   By: Maude Naegeli M.D.   On: 08/23/2023 10:12   DG C-Arm 1-60 Min Result Date: 08/18/2023 CLINICAL DATA:  Cystoscopy and retrograde pyelogram with ureteral stent insertion EXAM: DG C-ARM 1-60 MIN FLUOROSCOPY: Fluoroscopy Time:  34.7 Radiation Exposure Index (if provided by the fluoroscopic device): 4.91 mGy Number of Acquired Spot Images: 1 COMPARISON:  CT abdomen pelvis 08/12/2023 FINDINGS: Contrast within the dilated ureter and collecting system. Multiple dilated loops of small bowel. IMPRESSION: Contrast within the dilated ureter collecting system during retrograde pyelogram. Dilated loops of small bowel similar to 08/12/2023. Electronically Signed   By: Norman Gatlin M.D.   On: 08/18/2023 17:11   CT ABDOMEN PELVIS W CONTRAST Result Date: 08/12/2023 CLINICAL DATA:  Postop abdominal pain. Patient  underwent exploratory laparotomy with Hartmann's procedure for stercoral colitis and perforation. Subsequent pelvic percutaneous drain placement for abscess. EXAM: CT ABDOMEN AND PELVIS WITH CONTRAST TECHNIQUE: Multidetector CT imaging of the abdomen and pelvis was performed using the standard protocol following bolus administration of intravenous contrast. RADIATION DOSE REDUCTION: This exam was performed according to the departmental dose-optimization program which includes automated exposure control, adjustment of the mA and/or kV according to patient size and/or use of iterative reconstruction technique. CONTRAST:  75mL OMNIPAQUE  IOHEXOL  350 MG/ML SOLN COMPARISON:  Abdominopelvic CT 08/06/2023. FINDINGS: Lower chest: Trace left pleural effusion and mild bibasilar atelectasis again noted, similar to previous CT. There is atherosclerosis of the aorta and coronary arteries. Hepatobiliary: The liver is normal in density without suspicious focal abnormality. Stable hepatic cysts. No evidence of gallstones, gallbladder wall thickening or biliary dilatation. Pancreas: Unremarkable. No pancreatic ductal dilatation or surrounding inflammatory changes. Spleen: Normal in size without focal abnormality. Adrenals/Urinary Tract: Both adrenal glands appear normal. There is Beatriz Settles persistent partially obstructing calculus in the distal left ureter, measuring 11 mm on coronal image 65/7. Resulting hydronephrosis and hydroureter with delayed contrast excretion. No evidence of right ureteral obstruction. There are small renal cysts bilaterally for which no specific follow-up imaging is recommended. The bladder appears unremarkable for its degree of distention. Stomach/Bowel: No enteric contrast administered. Nasogastric tube extends into the mid stomach. The stomach is decompressed. Multiple fluid-filled loops of dilated small bowel are again noted, without focal transition point or significant wall thickening. The appendix is not  clearly visualized, although no pericecal inflammatory changes are identified. Colonic stool burden has improved compared with the previous study. The colon is relatively decompressed proximal to the sigmoid colostomy. The rectal pouch is fluid-filled and mildly distended. Vascular/Lymphatic: There are no enlarged abdominal or pelvic lymph nodes. Aortic and branch vessel atherosclerosis without evidence of aneurysm or large vessel occlusion. The portal, superior mesenteric and splenic veins are patent. Reproductive: The uterus and ovaries appear unremarkable. No evidence of adnexal mass. Other: Open midline anterior abdominal wall incision. New right transgluteal percutaneous drain appears appropriately position within the previously demonstrated complex pelvic fluid collection. However, this collection remains sizable and has slightly enlarged compared with the previous  study, measuring 8.0 x 6.7 cm on image 65/4. This collection likely communicates with an adjacent collection along the left pelvic sidewall, measuring 7.0 x 2.9 cm on image 68/4 and abuts the rectal pouch suture line. There are other less well-defined but enlarging peritoneal fluid collections with associated air bubbles and peritoneal enhancement, highly suspicious for peritonitis. Zayley Arras component inferior to the cecum measures up to 8.0 x 6.0 cm on image 58/4, and there is Tasman Zapata component inferior to the colostomy measuring 7.4 x 5.3 cm on image 52/4. Musculoskeletal: No acute or significant osseous findings. Multilevel spondylosis. No evidence of discitis or osteomyelitis. IMPRESSION: 1. New right transgluteal percutaneous drain appears appropriately positioned within the previously demonstrated complex pelvic fluid collection. However, this collection remains sizable and has slightly enlarged compared with the previous study. This abuts the rectal suture line and Unique Sillas leak in this area cannot be excluded. 2. There are other less well-defined but enlarging  peritoneal fluid collections with associated air bubbles and peritoneal enhancement, highly suspicious for peritonitis. 3. Persistent partially obstructing 11 mm calculus in the distal left ureter with resulting hydronephrosis and hydroureter. 4. Persistent fluid-filled loops of dilated small bowel without focal transition point, likely ileus. 5. No other significant parenchymal findings. 6.  Aortic Atherosclerosis (ICD10-I70.0). 7. These results will be called to the ordering clinician or representative by the Radiologist Assistant, and communication documented in the PACS or [employer]. Electronically Signed   By: Elsie Perone M.D.   On: 08/12/2023 19:06   US  EKG SITE RITE Result Date: 08/12/2023 If Site Rite image not attached, placement could not be confirmed due to current cardiac rhythm.  DG Abd 2 Views Result Date: 08/12/2023 CLINICAL DATA:  Follow-up ileus EXAM: ABDOMEN - 2 VIEW COMPARISON:  08/11/2023 FINDINGS: Enteric tube tip and side port are within the gastric fundus. Percutaneous drainage catheter enters from Orvilla Truett right lower quadrant approach and terminates in the right hemipelvis. Persistent dilated loops of small bowel within the left hemiabdomen measure up to 4.3 cm, similar to previous exam. No signs of pneumoperitoneum. IMPRESSION: Persistent dilated loops of small bowel within the left hemiabdomen, similar to previous exam. Electronically Signed   By: Waddell Calk M.D.   On: 08/12/2023 09:17   DG Abd 1 View Result Date: 08/11/2023 CLINICAL DATA:  NG tube placement EXAM: ABDOMEN - 1 VIEW COMPARISON:  08/11/2023 FINDINGS: NG tube tip is in the fundus of the stomach. Dilated small bowel loops. Gas within the colon, but the colon appears to be decompressed. No organomegaly or free air. IMPRESSION: NG tube tip in the stomach. Gaseous distension of multiple small bowel loops, similar to prior study. Electronically Signed   By: Franky Crease M.D.   On: 08/11/2023 18:41   DG Abd 1  View Result Date: 08/11/2023 CLINICAL DATA:  Follow-up ileus EXAM: ABDOMEN - 1 VIEW COMPARISON:  08/06/2023 radiograph and CT FINDINGS: Few air dilated loops of small bowel measuring up to 4 cm with scattered colon gas. Left lower quadrant ostomy. Drainage catheter in the right pelvis. Decreased stool in the colon. IMPRESSION: Few air dilated loops of small bowel with scattered colon gas, suggesting ileus, findings do not appear greatly changed compared to scout image from the CT 08/06/2023. Electronically Signed   By: Luke Bun M.D.   On: 08/11/2023 14:15   ECHOCARDIOGRAM COMPLETE Result Date: 08/07/2023    ECHOCARDIOGRAM REPORT   Patient Name:   BRIANCA FORTENBERRY Date of Exam: 08/07/2023 Medical Rec #:  [id number]  Height:       64.0 in Accession #:    [id number]     Weight:       136.2 lb Date of Birth:  [birth year]     BSA:          1.662 m Patient Age:    72 years       BP:           137/59 mmHg Patient Gender: F              HR:           79 bpm. Exam Location:  Inpatient Procedure: 2D Echo, Cardiac Doppler and Color Doppler (Both Spectral and Color            Flow Doppler were utilized during procedure). Indications:    CHF  History:        Patient has no prior history of Echocardiogram examinations.                 CHF; Risk Factors:Hypertension.  Sonographer:    Vella Key Referring Phys: BRAYTON GORMAN LYE IMPRESSIONS  1. Left ventricular ejection fraction, by estimation, is 60 to 65%. The left ventricle has normal function. The left ventricle has no regional wall motion abnormalities. There is mild concentric left ventricular hypertrophy. Left ventricular diastolic parameters were normal.  2. Right ventricular systolic function is normal. The right ventricular size is normal.  3. The mitral valve is normal in structure. Trivial mitral valve regurgitation. No evidence of mitral stenosis.  4. The aortic valve is normal in structure. Aortic valve regurgitation is not visualized. No aortic stenosis is  present.  5. The inferior vena cava is normal in size with greater than 50% respiratory variability, suggesting right atrial pressure of 3 mmHg. FINDINGS  Left Ventricle: Left ventricular ejection fraction, by estimation, is 60 to 65%. The left ventricle has normal function. The left ventricle has no regional wall motion abnormalities. The left ventricular internal cavity size was normal in size. There is  mild concentric left ventricular hypertrophy. Left ventricular diastolic parameters were normal. Right Ventricle: The right ventricular size is normal. No increase in right ventricular wall thickness. Right ventricular systolic function is normal. Left Atrium: Left atrial size was normal in size. Right Atrium: Right atrial size was normal in size. Pericardium: There is no evidence of pericardial effusion. Mitral Valve: The mitral valve is normal in structure. Trivial mitral valve regurgitation. No evidence of mitral valve stenosis. Tricuspid Valve: The tricuspid valve is normal in structure. Tricuspid valve regurgitation is mild . No evidence of tricuspid stenosis. Aortic Valve: The aortic valve is normal in structure. Aortic valve regurgitation is not visualized. No aortic stenosis is present. Pulmonic Valve: The pulmonic valve was normal in structure. Pulmonic valve regurgitation is trivial. No evidence of pulmonic stenosis. Aorta: The aortic root is normal in size and structure. Venous: The inferior vena cava is normal in size with greater than 50% respiratory variability, suggesting right atrial pressure of 3 mmHg. IAS/Shunts: No atrial level shunt detected by color flow Doppler.  LEFT VENTRICLE PLAX 2D LVIDd:         3.20 cm     Diastology LVIDs:         2.90 cm     LV e' medial:    8.70 cm/s LV PW:         1.00 cm     LV E/e' medial:  9.2 LV IVS:  0.90 cm     LV e' lateral:   9.46 cm/s LVOT diam:     1.80 cm     LV E/e' lateral: 8.5 LV SV:         78 LV SV Index:   47 LVOT Area:     2.54 cm  LV  Volumes (MOD) LV vol d, MOD A2C: 65.8 ml LV vol d, MOD A4C: 69.6 ml LV vol s, MOD A2C: 25.6 ml LV vol s, MOD A4C: 30.6 ml LV SV MOD A2C:     40.2 ml LV SV MOD A4C:     69.6 ml LV SV MOD BP:      39.5 ml RIGHT VENTRICLE RV Basal diam:  2.90 cm RV S prime:     17.70 cm/s TAPSE (M-mode): 2.5 cm LEFT ATRIUM             Index        RIGHT ATRIUM           Index LA diam:        2.70 cm 1.62 cm/m   RA Area:     12.90 cm LA Vol (A2C):   41.9 ml 25.21 ml/m  RA Volume:   26.20 ml  15.77 ml/m LA Vol (A4C):   37.9 ml 22.81 ml/m LA Biplane Vol: 40.5 ml 24.37 ml/m  AORTIC VALVE LVOT Vmax:   152.00 cm/s LVOT Vmean:  109.000 cm/s LVOT VTI:    0.305 m  AORTA Ao Root diam: 3.00 cm Ao Asc diam:  3.20 cm MITRAL VALVE               TRICUSPID VALVE MV Area (PHT): 2.63 cm    TV Peak grad:   30.2 mmHg MV Decel Time: 288 msec    TV Vmax:        2.75 m/s MV E velocity: 80.20 cm/s MV Theressa Piedra velocity: 99.60 cm/s  SHUNTS MV E/Raena Pau ratio:  0.81        Systemic VTI:  0.30 m                            Systemic Diam: 1.80 cm Aditya Sabharwal Electronically signed by Ria Commander Signature Date/Time: 08/07/2023/3:37:59 PM    Final    CT GUIDED PERITONEAL/RETROPERITONEAL FLUID DRAIN BY PERC CATH Result Date: 08/07/2023 INDICATION: 73 year old with history of 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 Patient has postoperative fluid collections that are concerning for abscess. EXAM: CT-GUIDED PLACEMENT OF PELVIC ABSCESS DRAIN TECHNIQUE: Multidetector CT imaging of the pelvis was performed following the standard protocol without IV contrast. RADIATION DOSE REDUCTION: This exam was performed according to the departmental dose-optimization program which includes automated exposure control, adjustment of the mA and/or kV according to patient size and/or use of iterative reconstruction technique. MEDICATIONS: Moderate sedation ANESTHESIA/SEDATION: Moderate (conscious) sedation was employed  during this procedure. Briann Sarchet total of Versed  4 mg and Fentanyl  200 mcg was administered intravenously by the radiology nurse. Total intra-service moderate Sedation Time: 31 minutes. The patient's level of consciousness and vital signs were monitored continuously by radiology nursing throughout the procedure under my direct supervision. COMPLICATIONS: None immediate. PROCEDURE: Informed written consent was obtained from the patient after Tehya Leath thorough discussion of the procedural risks, benefits and alternatives. All questions were addressed. Maximal Sterile Barrier Technique was utilized including caps, mask, sterile gowns, sterile gloves, sterile drape, hand hygiene and skin antiseptic. Jennalyn Cawley timeout was performed  prior to the initiation of the procedure. Patient was placed prone on the CT scanner. Images through the pelvis were obtained. Right buttock region was prepped with chlorhexidine  and sterile field was created. Skin and soft tissues were anesthetized with 1% lidocaine . Small incision was made. Using CT guidance, an 18 gauge trocar needle was directed into the pelvic fluid collection using Leatta Alewine right transgluteal approach. Gray colored purulent fluid was aspirated. Superstiff Amplatz wire was advanced into the collection. Follow up CT images were obtained. The tract was dilated to accommodate Carinna Newhart 12 [place] multipurpose drain. 100 mL of purulent fluid was aspirated. Follow up CT images were obtained. Drain was sutured to skin and attached to Lidwina Kaner suction bulb. Fluid was sent for culture. FINDINGS: 108 French drain placed from Costantino Kohlbeck right transgluteal approach. 100 mL of purulent fluid was removed. IMPRESSION: CT-guided placement of Upton Russey drain in the pelvic abscess. Electronically Signed   By: Juliene Balder M.D.   On: 08/07/2023 14:34   DG Abd Portable 1V Result Date: 08/06/2023 CLINICAL DATA:  NG tube placement. EXAM: PORTABLE ABDOMEN - 1 VIEW COMPARISON:  CT earlier today FINDINGS: Tip and side port of the enteric tube below the  diaphragm in the stomach. Dilated bowel loops in the upper abdomen are partially included in the field of view. IMPRESSION: Tip and side port of the enteric tube below the diaphragm in the stomach. Electronically Signed   By: Andrea Gasman M.D.   On: 08/06/2023 20:14   CT ABDOMEN PELVIS W CONTRAST Addendum Date: 08/06/2023 ADDENDUM REPORT: 08/06/2023 16:20 ADDENDUM: Critical Value/emergent results were called by telephone at the time of interpretation on 08/06/2023 at 4:20 pm to provider [facility] , who verbally acknowledged these results. Electronically Signed   By: [name] Birmingham M.D.   On: 08/06/2023 16:20   Result Date: 08/06/2023 CLINICAL DATA:  History of bowel perforation postoperatively and hydronephrosis. Worsening leukocytosis. EXAM: CT ABDOMEN AND PELVIS WITH CONTRAST TECHNIQUE: Multidetector CT imaging of the abdomen and pelvis was performed using the standard protocol following bolus administration of intravenous contrast. RADIATION DOSE REDUCTION: This exam was performed according to the departmental dose-optimization program which includes automated exposure control, adjustment of the mA and/or kV according to patient size and/or use of iterative reconstruction technique. CONTRAST:  75mL OMNIPAQUE  IOHEXOL  350 MG/ML SOLN COMPARISON:  07/30/2023. FINDINGS: Lower chest: Multi-vessel coronary artery calcifications are noted. There is Quintasha Gren small left pleural effusion. Atelectasis or infiltrate is present at the lung bases bilaterally, greater on the left than on the right. Hepatobiliary: Stable cysts are present in the left lobe of the liver. The gallbladder is without stones. No biliary ductal dilatation is seen. Pancreas: Unremarkable. No pancreatic ductal dilatation or surrounding inflammatory changes. Spleen: Normal in size without focal abnormality. Adrenals/Urinary Tract: The adrenal glands are within normal limits. There is Calina Patrie slightly delayed nephrogram on the left. No renal calculus is  seen bilaterally. Scattered hypodensities are noted in the kidneys bilaterally, likely cysts. There is moderate hydronephrosis on the left with Circe Chilton 9 mm calculus in the distal left ureter at the pelvic inlet. No hydronephrosis on the right. The bladder is unremarkable. Stomach/Bowel: The stomach is distended with fluid. Multiple loops of distended small bowel are noted in the abdomen measuring up to 4.3 cm in the mid left abdomen. No definite transition point is seen. There is evidence of partial colectomy with left lower quadrant colostomy. Appendix appears normal. Vascular/Lymphatic: Aortic atherosclerosis. No enlarged abdominal or pelvic lymph nodes. Reproductive: Uterus and bilateral  adnexa are unremarkable. Other: Postsurgical changes are noted in the low anterior abdominal wall. Fat stranding, free air, and free fluid are present in the inferior abdomen and pelvis. There is Dianna Ewald complex fluid collection containing air in the pelvis and presacral space. Mild anasarca is noted. Musculoskeletal: Degenerative changes are noted in the thoracolumbar spine. No acute osseous abnormality is seen. IMPRESSION: 1. Status post partial colectomy with left lower quadrant ostomy. Complex free fluid with multiple foci of air, most pronounced in the surgical bed. While findings may be postsurgical, possibility of superimposed infection or bowel leak cannot be excluded. 2. Evelio Rueda few mildly distended loops of small bowel in the mid left abdomen with no obvious transition point, possible postoperative ileus versus early or partial small bowel obstruction. 3. Small left pleural effusion with atelectasis or infiltrate at the lung bases. 4. Moderate left hydroureteronephrosis with Braydn Carneiro 9 mm stone in the distal left ureter at the pelvic inlet, unchanged. 5. Aortic atherosclerosis and coronary artery calcifications. Electronically Signed: By: [name] Birmingham M.D. On: 08/06/2023 16:10    Microbiology: Recent Results (from the past 240 hours)   Urine Culture (for pregnant, neutropenic or urologic patients or patients with an indwelling urinary catheter)     Status: Abnormal   Collection Time: 09/01/23  8:30 AM   Specimen: Urine, Clean Catch  Result Value Ref Range Status   Specimen Description URINE, CLEAN CATCH  Final   Special Requests   Final    NONE Performed at [facility], [address] [place], [place] [address]    Culture (Shenise Wolgamott)  Final    >=100,000 COLONIES/mL CITROBACTER YOUNGAE 30,000 COLONIES/mL ESCHERICHIA COLI 90,000 COLONIES/mL PROTEUS MIRABILIS    Report Status 09/04/2023 FINAL  Final   Organism ID, Bacteria CITROBACTER YOUNGAE (Tiphany Fayson)  Final   Organism ID, Bacteria ESCHERICHIA COLI (Jhonny Calixto)  Final   Organism ID, Bacteria PROTEUS MIRABILIS (Prabhjot Maddux)  Final      Susceptibility   Citrobacter youngae - MIC*    CEFEPIME  <=0.12 SENSITIVE Sensitive     CEFTRIAXONE  1 SENSITIVE Sensitive     CIPROFLOXACIN <=0.25 SENSITIVE Sensitive     GENTAMICIN <=1 SENSITIVE Sensitive     IMIPENEM 0.5 SENSITIVE Sensitive     NITROFURANTOIN <=16 SENSITIVE Sensitive     TRIMETH /SULFA  <=20 SENSITIVE Sensitive     PIP/TAZO <=4 SENSITIVE Sensitive ug/mL    * >=100,000 COLONIES/mL CITROBACTER YOUNGAE   Escherichia coli - MIC*    AMPICILLIN  8 SENSITIVE Sensitive     CEFAZOLIN  <=4 SENSITIVE Sensitive     CEFEPIME  <=0.12 SENSITIVE Sensitive     CEFTRIAXONE  <=0.25 SENSITIVE Sensitive     CIPROFLOXACIN <=0.25 SENSITIVE Sensitive     GENTAMICIN <=1 SENSITIVE Sensitive     IMIPENEM <=0.25 SENSITIVE Sensitive     NITROFURANTOIN <=16 SENSITIVE Sensitive     TRIMETH /SULFA  <=20 SENSITIVE Sensitive     AMPICILLIN /SULBACTAM <=2 SENSITIVE Sensitive     PIP/TAZO <=4 SENSITIVE Sensitive ug/mL    * 30,000 COLONIES/mL ESCHERICHIA COLI   Proteus mirabilis - MIC*    AMPICILLIN  <=2 SENSITIVE Sensitive     CEFAZOLIN  <=4 SENSITIVE Sensitive     CEFEPIME  <=0.12 SENSITIVE Sensitive     CEFTRIAXONE  <=0.25 SENSITIVE Sensitive     CIPROFLOXACIN <=0.25  SENSITIVE Sensitive     GENTAMICIN <=1 SENSITIVE Sensitive     IMIPENEM 8 INTERMEDIATE Intermediate     NITROFURANTOIN 128 RESISTANT Resistant     TRIMETH /SULFA  <=20 SENSITIVE Sensitive     AMPICILLIN /SULBACTAM <=2 SENSITIVE Sensitive  PIP/TAZO <=4 SENSITIVE Sensitive ug/mL    * 90,000 COLONIES/mL PROTEUS MIRABILIS     Labs: Basic Metabolic Panel: Recent Labs  Lab 08/30/23 0320 08/31/23 0358 09/01/23 0324 09/02/23 0350 09/04/23 0340  NA 138 137 137 136 137  K 4.1 4.1 3.6 3.9 3.4*  CL 104 102 103 102 103  CO2 27 26 27 25 27   GLUCOSE 108* 108* 102* 94 94  BUN 20 23 20 16 13   CREATININE 0.49 0.50 0.55 0.55 0.49  CALCIUM  8.5* 8.4* 8.6* 8.4* 8.5*  MG 1.7 2.0 1.8 1.7 1.7  PHOS 4.2 4.5 4.2 3.5 4.1   Liver Function Tests: Recent Labs  Lab 08/29/23 0407 08/30/23 0320 08/31/23 0358 09/01/23 0324 09/02/23 0350  AST 12* 12* 15 14* 14*  ALT 11 11 11 12 11   ALKPHOS 65 66 63 70 59  BILITOT 0.4 0.4 0.5 0.6 0.3  PROT 5.2* 5.3* 5.2* 5.3* 5.1*  ALBUMIN  2.0* 2.0* 2.0* 2.0* 2.1*   No results for input(s): LIPASE, AMYLASE in the last 168 hours. No results for input(s): AMMONIA in the last 168 hours. CBC: Recent Labs  Lab 08/29/23 0407 08/30/23 0320 08/31/23 0358 09/01/23 0324 09/02/23 0350 09/04/23 0340  WBC 8.2 7.7 7.5 6.1 5.3 5.9  NEUTROABS 5.5 5.1 5.1 3.4 2.6  --   HGB 10.4* 10.1* 10.2* 10.3* 9.9* 9.8*  HCT 32.0* 31.3* 31.9* 31.9* 31.1* 31.3*  MCV 90.7 91.5 92.2 93.3 92.8 93.7  PLT 422* 359 360 328 305 312   Cardiac Enzymes: No results for input(s): CKTOTAL, CKMB, CKMBINDEX, TROPONINI in the last 168 hours. BNP: BNP (last 3 results) Recent Labs    08/05/23 0741 08/06/23 1030  BNP 203.7* 313.8*    ProBNP (last 3 results) No results for input(s): PROBNP in the last 8760 hours.  CBG: Recent Labs  Lab 08/31/23 2307 09/01/23 0419 09/01/23 2354 09/02/23 0648 09/03/23 2353  GLUCAP 107* 112* 88 97 96       Signed:  Meliton Monte  MD.  [facility] 09/04/2023, 11:54 AM

## 2023-09-04 NOTE — Progress Notes (Signed)
 Referring Physician(s): Dr. Ann  Supervising Physician: Jenna Hacker  Patient Status:  [facility] - In-pt  Chief Complaint:  Post op Intra-abdominal fluid collection s/p  a right transgluteal abscess drain on 6.30.25. Exchanged on 7.23.25 and a RLQ abscess drain placed on 7.7.25 removed on 7.17.25.  Subjective:  Patient sitting up on this side of the bed. States that is being discharged home. All questions and concerns regarding the pelvic abscess drain were answered.   Allergies: Codeine  Medications: Prior to Admission medications   Medication Sig Start Date End Date Taking? Authorizing Provider  albuterol  (VENTOLIN  HFA) 108 (90 Base) MCG/ACT inhaler Inhale 2 puffs into the lungs every 6 (six) hours as needed for wheezing or shortness of breath. 03/03/22  Yes Kuneff, Renee A, DO  Calcium  Polycarbophil (FIBER-CAPS PO) Take 1 capsule by mouth daily at 12 noon.   Yes [provider]  HYDROcodone -acetaminophen  (NORCO/VICODIN) 5-325 MG tablet Take 1 tablet by mouth every 6 (six) hours as needed for moderate pain (pain score 4-6). 07/29/23  Yes Zackowski, Scott, MD  hydrocortisone  (ANUSOL -HC) 25 MG suppository Place 1 suppository (25 mg total) rectally 2 (two) times daily. 06/23/23  Yes Kuneff, Renee A, DO  hydrocortisone  cream 1 % Apply 1 Application topically 2 (two) times daily. 06/08/23  Yes Kuneff, Renee A, DO  latanoprost  (XALATAN ) 0.005 % ophthalmic solution Place 1 drop into both eyes at bedtime. 08/11/17  Yes [provider]  LINZESS 72 MCG capsule Take 72 mcg by mouth daily before breakfast. 06/26/23  Yes [provider]  lisinopril  (ZESTRIL ) 10 MG tablet TAKE 1 TABLET BY MOUTH DAILY 05/22/23  Yes Kuneff, Renee A, DO  Multiple Vitamin (MULTIVITAMIN) tablet Take 1 tablet by mouth daily.   Yes [provider]  ondansetron  (ZOFRAN -ODT) 4 MG disintegrating tablet Take 1 tablet (4 mg total) by mouth every 8 (eight) hours as needed for nausea or  vomiting. 07/29/23  Yes Zackowski, Scott, MD  oxyCODONE  (ROXICODONE ) 5 MG immediate release tablet Take 1 tablet (5 mg total) by mouth every 6 (six) hours as needed for up to 5 days. 09/04/23 09/09/23 Yes Perri DELENA Meliton Mickey., MD  Probiotic Product (DAILY PROBIOTIC PO) Take 1 capsule by mouth daily at 12 noon.   Yes [provider]  sodium chloride  flush (NS) 0.9 % SOLN Flush drain with 5 cc daily for 14 days.  Record output. 09/04/23 09/18/23 Yes Perri DELENA Meliton Mickey., MD  sulfamethoxazole -trimethoprim  (BACTRIM  DS) 800-160 MG tablet Take 1 tablet by mouth 2 (two) times daily for 1 day. Your last doses of antibiotics for your urine infection will be on 7/29. 09/05/23 09/06/23 Yes Perri DELENA Meliton Mickey., MD  timolol  (TIMOPTIC ) 0.5 % ophthalmic solution Place 1 drop into both eyes 2 (two) times daily. 11/14/19  Yes [provider]  acetaminophen  (TYLENOL ) 500 MG tablet Take 2 tablets (1,000 mg total) by mouth every 6 (six) hours as needed. 09/04/23   Perri DELENA Meliton Mickey., MD  amoxicillin -clavulanate (AUGMENTIN ) 875-125 MG tablet Take 1 tablet by mouth every 12 (twelve) hours. Patient not taking: Reported on 07/31/2023 07/29/23   Zackowski, Scott, MD  atorvastatin  (LIPITOR) 20 MG tablet Take 1 tablet (20 mg total) by mouth at bedtime. Patient not taking: Reported on 02/22/2023 11/30/21   Catherine Fuller A, DO  carvedilol  (COREG ) 6.25 MG tablet Take 1 tablet (6.25 mg total) by mouth 2 (two) times daily with a meal. 09/04/23 10/04/23  Perri DELENA Meliton Mickey., MD  ferrous sulfate  325 [phone]  FE) MG tablet Take 1 tablet (325 mg total) by mouth every other day. Follow your iron and anemia with your PCP outpatient 09/04/23 10/04/23  Perri DELENA Meliton Mickey., MD  pantoprazole  (PROTONIX ) 40 MG tablet Take 1 tablet (40 mg total) by mouth daily. 09/04/23   Perri DELENA Meliton Mickey., MD  polyethylene glycol powder (GLYCOLAX /MIRALAX ) 17 GM/SCOOP powder Take 17 g by mouth daily. 09/04/23   Perri DELENA Meliton Mickey., MD      Vital Signs: BP (!) 156/86 (BP Location: Left Arm)   Pulse 70   Temp 98.7 F (37.1 C) (Oral)   Resp 18   Ht 5' 4 (1.626 m)   Wt 124 lb 12.5 oz (56.6 kg)   SpO2 95%   BMI 21.42 kg/m   Physical Exam Vitals and nursing note reviewed.  Constitutional:      Appearance: She is well-developed.  HENT:     Head: Normocephalic and atraumatic.  Eyes:     Conjunctiva/sclera: Conjunctivae normal.  Pulmonary:     Effort: Pulmonary effort is normal.  Abdominal:     Comments: Positive right transgluteal drain to  suction.. Site is unremarkable with no erythema, edema, tenderness, bleeding or drainage noted at exit site. Suture and stat lock in place. Dressing is clean dry and intact. < 5 ml of  tan- pink colored fluid noted in  bulb suction device    Musculoskeletal:        General: Normal range of motion.     Cervical back: Normal range of motion.  Skin:    General: Skin is warm.  Neurological:     Mental Status: She is alert and oriented to person, place, and time.     Imaging: No results found.  Labs:  CBC: Recent Labs    08/31/23 0358 09/01/23 0324 09/02/23 0350 09/04/23 0340  WBC 7.5 6.1 5.3 5.9  HGB 10.2* 10.3* 9.9* 9.8*  HCT 31.9* 31.9* 31.1* 31.3*  PLT 360 328 305 312    COAGS: Recent Labs    07/29/23 0826 08/07/23 0909 08/15/23 0431  INR 1.1 1.2 1.1    BMP: Recent Labs    08/31/23 0358 09/01/23 0324 09/02/23 0350 09/04/23 0340  NA 137 137 136 137  K 4.1 3.6 3.9 3.4*  CL 102 103 102 103  CO2 26 27 25 27   GLUCOSE 108* 102* 94 94  BUN 23 20 16 13   CALCIUM  8.4* 8.6* 8.4* 8.5*  CREATININE 0.50 0.55 0.55 0.49  GFRNONAA >60 >60 >60 >60    LIVER FUNCTION TESTS: Recent Labs    08/30/23 0320 08/31/23 0358 09/01/23 0324 09/02/23 0350  BILITOT 0.4 0.5 0.6 0.3  AST 12* 15 14* 14*  ALT 11 11 12 11   ALKPHOS 66 63 70 59  PROT 5.3* 5.2* 5.3* 5.1*  ALBUMIN  2.0* 2.0* 2.0* 2.1*    Assessment and Plan:  HTN, HLD, glaucoma who recently  underwent ex lap with sigmoidectomy and Hartmann's procedure on 6/22 with Dr. KANDICE Idler. She was started on IV antibiotics, her diet was gradually advanced, and patient initially seemed to be recovering well, but CT on 6/29 revealed complex free fluid w/ air in the surgical bed concerning for superimposed infection vs bowel leak. Found to have intra abdominal abscess. IR placed a right transgluteal abscess drain on 6.30.25. Found to have additional RLQ fluid collection. IR placed a second drain to the  RLQ collection. RUQ drain was removed on 7.17.25. Right transgluteal drain last exchanged on 7.23.25.  Drain Location:  right transgluteal pelvic abscess rain  Size: Fr size: 12 Fr Date of placement: 6.30.25  Currently to: Drain collection device: suction bulb 24 hour output:  Output by Drain (mL) 09/02/23 0700 - 09/02/23 1459 09/02/23 1500 - 09/02/23 2259 09/02/23 2300 - 09/03/23 0659 09/03/23 0700 - 09/03/23 1459 09/03/23 1500 - 09/03/23 2259 09/03/23 2300 - 09/04/23 0659 09/04/23 0700 - 09/04/23 1320  Closed System Drain 1 Right Buttock Bulb (JP) 12 Fr.  15    20     Interval imaging/drain manipulation:  None since drain exchange on 7.23.25  Current examination: Flushes/aspirates easily.  Insertion site unremarkable. Suture and stat lock in place. Dressed appropriately.   Plan: Continue TID flushes with 5 cc NS. Record output Q shift. Dressing changes QD or PRN if soiled.  Call IR APP or on call IR MD if difficulty flushing or sudden change in drain output.  Repeat imaging/possible drain injection once output < 10 mL/QD (excluding flush material). Consideration for drain removal if output is < 10 mL/QD (excluding flush material), pending discussion with the providing surgical service.  Discharge planning: D/c and appropriate follow up plans are in place. Typically patient will follow up with IR clinic 10-14 days post d/c for repeat imaging/possible drain injection. IR scheduler will  contact patient with date/time of appointment. Patient will need to flush drain QD with 5 cc NS, record output QD, dressing changes every 2-3 days or earlier if soiled. Patient verbals understanding. RN to educate the patient's husband on flushing and dressing changed when he returns to bedside.   IR will continue to follow - please call with questions or concerns.     Electronically Signed: Delon JAYSON Beagle, NP 09/04/2023, 1:18 PM   I spent a total of 15 Minutes at the patient's bedside AND on the patient's hospital floor or unit, greater than 50% of which was counseling/coordinating care for right transgluteal pelvic abscess drain.

## 2023-09-04 NOTE — Consult Note (Signed)
 WOC Nurse ostomy follow up Stoma type/location: LLQ colostomy Stomal assessment/size:  1 1/2 inch round, moist, os at center  Peristomal assessment:intact Treatment options for stomal/peristomal skin: 2 inch barrier ring Output stool Ostomy pouching: 2pc. Flat with barrier ring. 2 piece ostomy skin barrier (lawson #2), ostomy pouch (lawson [phone]), barrier rings (lawson [phone])   Education provided: Spouse at bedside and independently performed ostomy appliance change. Message sent to bedside RN requesting additional supplies to be ordered to bedside.  Patient reports possible dc today vs tomorrow. Spouse reports he has placed orders with [employer] for home supplies, pending delivery.  Enrolled patient in [place] Secure Start Discharge program: Yes  WOC team will see patient on Monday for continued teaching/ reinforcement if still inpatient.   Thank you,  Doyal Polite, RN, MSN, [facility] WOC Team

## 2023-09-04 NOTE — TOC Initial Note (Signed)
 Transition of Care [facility]) - Initial/Assessment Note    Patient Details  Name: Angela [name] MRN: [id number] Date of Birth: 11/14/1950  Transition of Care [facility]) CM/SW Contact:    Robynn Eileen Hoose, RN Phone Number: 09/04/2023, 11:38 AM  Clinical Narrative:                  Patient is being discharged today. Spoke with family at bedside. Concerned about wound care for patient, obtaining supplies and showering. HH aide to be added to [facility] services, Angela [name] with [facility] made aware. Per Angela [name], pt will need about a week worth of supplies for wound care and ostomy care. Message to floor nurse to inform of supplies needed for home.       Patient Goals and CMS Choice            Expected Discharge Plan and Services         Expected Discharge Date: 09/04/23                                    Prior Living Arrangements/Services                       Activities of Daily Living   ADL Screening (condition at time of admission) Independently performs ADLs?: Yes (appropriate for developmental age) Is the patient deaf or have difficulty hearing?: No Does the patient have difficulty seeing, even when wearing glasses/contacts?: Yes Does the patient have difficulty concentrating, remembering, or making decisions?: No  Permission Sought/Granted                  Emotional Assessment              Admission diagnosis:  Bowel perforation (HCC) [K63.1] Left ureteral stone [N20.1] Perforated sigmoid colon (HCC) [K63.1] Patient Active Problem List   Diagnosis Date Noted   Protein-calorie malnutrition, severe 08/14/2023   Bowel perforation (HCC) 07/30/2023   Hydroureteronephrosis 07/30/2023   Metabolic acidosis with increased anion gap and accumulation of organic acids 07/30/2023   Hyponatremia 07/30/2023   Glaucoma 07/30/2023   Perforated sigmoid colon (HCC) 07/30/2023   Chronic idiopathic constipation 06/25/2020   Hematochezia 06/25/2020   Hyperlipidemia  06/25/2020   Elevated hemoglobin A1c 01/22/2020   Leukocytosis 01/22/2020   Essential hypertension 06/19/2019   Abnormal mammogram 05/15/2017   PCP:  Catherine Charlies LABOR, DO Pharmacy:   [facility] [id number] - [place] - [address] [address] Phone: [phone] Fax: [phone]  [facility] [address] [place] [address] Phone: [phone] Fax: [phone]     Social Drivers of Health (SDOH) Social History: SDOH Screenings   Food Insecurity: Patient Declined (08/02/2023)  Housing: Unknown (08/02/2023)  Transportation Needs: Patient Declined (08/02/2023)  Utilities: Patient Declined (08/02/2023)  Alcohol Screen: Low Risk  (04/19/2023)  Depression (PHQ2-9): Low Risk  (06/23/2023)  Financial Resource Strain: Patient Declined (04/19/2023)  Physical Activity: Sufficiently Active (04/19/2023)  Social Connections: Patient Declined (08/02/2023)  Stress: No Stress Concern Present (04/19/2023)  Tobacco Use: Medium Risk (07/30/2023)  Health Literacy: Adequate Health Literacy (04/19/2023)   SDOH Interventions:     Readmission Risk Interventions     No data to display

## 2023-09-04 NOTE — Progress Notes (Addendum)
 Nutrition Follow-up  DOCUMENTATION CODES:   Severe malnutrition in context of acute illness/injury  INTERVENTION:  Monitor for diet advancement and tolerance   Discontinue calorie count to assess ability to wean TPN   Continue Magic cup TID with meals    Continue Ensure Plus High Protein po BID when full liquid diet resumed    Resume oral MVI w/ minerals    Discontinue TPN and encourage PO intake   NUTRITION DIAGNOSIS:  Severe Malnutrition related to acute illness (stercoral colitis w/ ischemia and performation w/ feculent peritonitis) as evidenced by mild fat depletion, moderate fat depletion.  GOAL:  Patient will meet greater than or equal to 90% of their needs  MONITOR:  TF tolerance, I & O's, Diet advancement, Labs  REASON FOR ASSESSMENT:  Consult New TPN/TNA  ASSESSMENT:  73 yo female with stercoral colitis admitted on 6/22 with ischemia and perforation with feculent peritonitis requiring sigmoidectomy with end colostomy. Pt seen in ED on 6/21 with CT indicating colitis with large stool burden-discharged on antibiotics. Post-op course complicated by pelvic abscess and post-op ileus, inability to tolerate po. PMH includes chronic idiopathic constipation, HLD, HTN  6/22 Admitted, Ex Lap, open sigmoid colectomy with colostomy for stercoral colitis with ischemia and perforation with feculent peritonitis 6/23 NPO with ice chips 6/25 CL diet initiated 6/28 Advanced to [facility] diet 6/29 Back to NPO, CT A/P: complex free fluid with multiple foci of air, possible infection or bowel leak; NGT re-placed 6/30 CT-guided drainage of pelvic abscess by IR, drain left in place, ECHO EF 60-65% 7/01 Started back on CL diet, NGT removed 7/02 Advanced to [facility] Diet 7/03 Diet downgraded to CL  due to abdominal distention 7/04 Not tolerating CL diet, NGT (placed) to LIS, NPO 7/05 Abd xray with persistent dilated loops of small bowel within the left hemiabdomen measure up to 4.3 cm. PICC line  placement with TPN initiation 7/07 Pelvic abscess culture: moderate proteus and enterococcus; IR removed old TG drain and replaced with two drains ( R TG 12Fr and RLQ 12 Fr) 7/07 CT-guided placement of abdominal abscess drain to RLQ - yield  7/08 TPN at goal rate 7/10 NGT removed 7/11 advanced to clear liquid diet; NGT replaced 7/12 cytoscopy w/ L ureteral stent placement; L retrograde pyelogram 7/13 NGT removed 7/14 advanced to full liquid diet; transfused 1 unit PRBC 7/16 ureteral stent placement; re[ear CT abd/pelvis: small bowel obstruction 7/17 abd XR: SBO; NGT re-placed ; RLQ drain removed 7/19 NGT clamp trials 7/20 transfused 2 units PRBCs; L ureter drain removed; nausea overnight w/ NGT clamped; LIWS - 250cc output and NGT re-clamped; advanced to clear liquid diet 7/22 NGT removed; diet advanced to full liquid diet  7/24: diet advanced to GI soft 7/27: TPN discontinued  TPN has been discontinued. 48 hour calorie count ordered. Results are below for review. Patient making a concerted effort to consume as much as possible and nutrition support is no longer indicated. She endorses never being a big eater even at baseline.   July 24th, 2025 50% pancake, 50% Magic Cup, 75% Ensure Plus High Protein, 10% chicken and gravy (646 kcals, 22g protein)  July 25th, 2025 10% cookie, 10% [place] toast, 100% Ensure Plus High Protein, 100% (1 cup) wonton soup, 100% (1 cup) of pears (606 kcals, 28g protein)  July 26th, 2025 50% yogurt parfait w/ fruit from [employer], 50% cookie, 50% broccoli cheddar soup from [employer], 100% Ensure Plus High Protein, 50% Nabs peanut butter crackers, 75% fruit bowl from  [employer], 100% Ensure Plus High Protein (1292 kcals, 61g protein)  July 27th, 2025 100% [place] toast, 100% peaches, 100% apple juice, 100% Ensure Plus High Protein, 50% Magic Cup, 10% [place] and gravy, 50% fruit cup, 100% Ensure Plus High Protein, 10% cookie (1308 kcals, 54g protein)   Average  Total intake: 963 kcal (59% of minimum estimated needs)  42 protein (53% of minimum estimated needs)  Ambulating this morning. Having ostomy output. Denies abdominal pain or nausea. Abdominal wound healing with BID dressing changes ordered.    Admit Weight: 57.2 kg  Current Weight: 56.6 kg Lowest Weight: 55.7 kg on 7/24 +generalized, non-pitting edema   Weight stable. Edema improved. Stomal suppositories continue.   Drains/Lines: R basilic: PICC (placed 7/05) LLQ: colostomy: x24 hours - ?accuracy R buttock: JP drain (12 Fr): 20ml x24 hours UOP: x24 hours   Labs grossly unremarkable. IV ABX continue. No significant issues. Approaching discharge.   Labs: Sodium 137 (wdl)  Potassium 3.4 (wdl) Magnesium  1.7 (wdl) Phosphorus 4.1 (wdl) CRP 0.9 (7/20) WBC 5.9 (wdl) CBGs 94-94 x48 hours   Meds:  Dulcolax (stomal suppository) Ferrous sulfate  Folic acid  Pantoprazole  MVI w/ minerals Miralax  IV ABX  Diet Order:   Diet Order             DIET SOFT Room service appropriate? Yes; Fluid consistency: Thin  Diet effective now            EDUCATION NEEDS:  Education needs have been addressed  Skin:  Skin Assessment: Skin Integrity Issues: Skin Integrity Issues:: Incisions Incisions: abdomen (closed)  Last BM:  7/13 via ostomy - no output documented  Height:  Ht Readings from Last 1 Encounters:  08/18/23 5' 4 (1.626 m)   Weight:  Wt Readings from Last 1 Encounters:  09/02/23 56.6 kg   Ideal Body Weight:  54.55 kg  BMI:  Body mass index is 21.42 kg/m.  Estimated Nutritional Needs:   Kcal:  1650-1850 kcals  Protein:  80-90 g  Fluid:  >/= 1.7 L  Blair Deaner MS, RD, LDN Registered Dietitian Clinical Nutrition RD Inpatient Contact Info in Amion

## 2023-09-05 ENCOUNTER — Telehealth: Payer: Self-pay

## 2023-09-05 NOTE — Transitions of Care (Post Inpatient/ED Visit) (Signed)
   09/05/2023  Name: Angela [name] MRN: [id number] DOB: November 22, 1950  Today's TOC FU Call Status: Today's TOC FU Call Status:: Unsuccessful Call (1st Attempt) Unsuccessful Call (1st Attempt) Date: 09/05/23  Attempted to reach the patient regarding the most recent Inpatient/ED visit.  Follow Up Plan: Additional outreach attempts will be made to reach the patient to complete the Transitions of Care (Post Inpatient/ED visit) call.   Alan Ee, RN, BSN, CEN [employer]- Transition of Care Team.  [facility] [phone]

## 2023-09-05 NOTE — Transitions of Care (Post Inpatient/ED Visit) (Signed)
 09/05/2023  Name: Angela [name] MRN: [id number] DOB: 1950-02-21  Today's TOC FU Call Status: Today's TOC FU Call Status:: Successful TOC FU Call Completed TOC FU Call Complete Date: 09/05/23 Patient's Name and Date of Birth confirmed.  Transition Care Management Follow-up Telephone Call Date of Discharge: 09/04/23 Discharge Facility: [facility]) Type of Discharge: Inpatient Admission Primary Inpatient Discharge Diagnosis:: Bowel performation How have you been since you were released from the hospital?: Better (husband reports that she having alot urine.)  Items Reviewed: Did you receive and understand the discharge instructions provided?: Yes Medications obtained,verified, and reconciled?: Yes (Medications Reviewed) Any new allergies since your discharge?: No Dietary orders reviewed?: Yes Type of Diet Ordered:: low salt heart healhty Do you have support at home?: Yes People in Home [RPT]: spouse Name of Support/Comfort Primary Source: husband - Francis  Medications Reviewed Today: Medications Reviewed Today     Reviewed by Rumalda Alan PENNER, RN (Registered Nurse) on 09/05/23 at 1404  Med List Status: <None>   Medication Order Taking? Sig Documenting Provider Last Dose Status Informant  acetaminophen  (TYLENOL ) 500 MG tablet [id number] Yes Take 2 tablets (1,000 mg total) by mouth every 6 (six) hours as needed. Perri DELENA Meliton Mickey., MD  Active   albuterol  (VENTOLIN  HFA) 108 [phone] Base) MCG/ACT inhaler [id number]  Inhale 2 puffs into the lungs every 6 (six) hours as needed for wheezing or shortness of breath.  Patient not taking: Reported on 09/05/2023   Catherine [name], DO  Active Self, Pharmacy Records, Multiple Informants, Spouse/Significant Other  atorvastatin  (LIPITOR) 20 MG tablet [id number]  Take 1 tablet (20 mg total) by mouth at bedtime.  Patient not taking: Reported on 09/05/2023   Catherine [name], DO  Active Self, Pharmacy Records, Multiple Informants, Spouse/Significant  Other  Calcium  Polycarbophil (FIBER-CAPS PO) [id number]  Take 1 capsule by mouth daily at 12 noon.  Patient not taking: Reported on 09/05/2023   [provider]  Active Self, Pharmacy Records, Multiple Informants, Spouse/Significant Other  carvedilol  (COREG ) 6.25 MG tablet [id number] Yes Take 1 tablet (6.25 mg total) by mouth 2 (two) times daily with a meal. Perri DELENA Meliton Mickey., MD  Active   ferrous sulfate  325 (65 FE) MG tablet [id number] Yes Take 1 tablet (325 mg total) by mouth every other day. Follow your iron and anemia with your PCP outpatient Perri DELENA Meliton Mickey., MD  Active   latanoprost  (XALATAN ) 0.005 % ophthalmic solution [id number] Yes Place 1 drop into both eyes at bedtime. [provider]  Active Self, Pharmacy Records, Multiple Informants, Spouse/Significant Other  lisinopril  (ZESTRIL ) 10 MG tablet [id number] Yes TAKE 1 TABLET BY MOUTH DAILY Kuneff, Renee A, DO  Active Self, Pharmacy Records, Multiple Informants, Spouse/Significant Other  Multiple Vitamin (MULTIVITAMIN) tablet [id number] Yes Take 1 tablet by mouth daily. [provider]  Active Self, Pharmacy Records, Multiple Informants, Spouse/Significant Other  ondansetron  (ZOFRAN -ODT) 4 MG disintegrating tablet [id number] Yes Take 1 tablet (4 mg total) by mouth every 8 (eight) hours as needed for nausea or vomiting. Zackowski, Scott, MD  Active Self, Pharmacy Records, Multiple Informants, Spouse/Significant Other  oxyCODONE  (ROXICODONE ) 5 MG immediate release tablet [id number] Yes Take 1 tablet (5 mg total) by mouth every 6 (six) hours as needed for up to 5 days. Perri DELENA Meliton Mickey., MD  Active   pantoprazole  (PROTONIX ) 40 MG tablet [id number] Yes Take 1 tablet (40 mg total) by mouth daily. Perri DELENA Meliton Mickey., MD  Active   polyethylene glycol powder (GLYCOLAX /MIRALAX )  17 GM/SCOOP powder [id number] Yes Take 17 g by mouth daily. Perri DELENA Meliton Mickey., MD  Active   Probiotic Product (DAILY PROBIOTIC PO)  [id number] Yes Take 1 capsule by mouth daily at 12 noon. [provider]  Active Self, Pharmacy Records, Multiple Informants, Spouse/Significant Other  sodium chloride  flush (NS) 0.9 % SOLN [id number] Yes Flush drain with 5 cc daily for 14 days.  Record output. Perri DELENA Meliton Mickey., MD  Active   sulfamethoxazole -trimethoprim  (BACTRIM  DS) 800-160 MG tablet [id number] Yes Take 1 tablet by mouth 2 (two) times daily for 1 day. Your last doses of antibiotics for your urine infection will be on 7/29. Perri DELENA Meliton Mickey., MD  Active   timolol  (TIMOPTIC ) 0.5 % ophthalmic solution [id number] Yes Place 1 drop into both eyes 2 (two) times daily. [provider]  Active Self, Pharmacy Records, Multiple Informants, Spouse/Significant Other            Home Care and Equipment/Supplies: Were Home Health Services Ordered?: Yes Name of Home Health Agency:: [facility] Has Agency set up a time to come to your home?: Yes First Home Health Visit Date: 09/06/23 EMR reviewed for Home Health Orders: Orders present/patient has not received call (refer to CM for follow-up) (Spoke with [facility] and I requested patient be seen asap.) Any new equipment or medical supplies ordered?: Yes Name of Medical supply agency?: [facility] Were you able to get the equipment/medical supplies?: Yes Do you have any questions related to the use of the equipment/supplies?: No  Functional Questionnaire: Do you need assistance with bathing/showering or dressing?: Yes Do you need assistance with meal preparation?: Yes Do you need assistance with eating?: No Do you have difficulty maintaining continence: Yes Do you need assistance with getting out of bed/getting out of a chair/moving?: Yes Do you have difficulty managing or taking your medications?: Yes (husband assist)  Follow up appointments reviewed: PCP Follow-up appointment confirmed?: No (I called PCP office to inform of current condition) MD Provider Line  [phone] Given: No Specialist Hospital Follow-up appointment confirmed?: Yes Date of Specialist follow-up appointment?: 09/06/23 (I placed call to MD office and spkoe with nurse Chloe and scheduled follow up for tomorrow.) Reason Specialist Follow-Up Not Confirmed: Patient has Specialist Provider Number and will Call for Appointment Do you need transportation to your follow-up appointment?: No Do you understand care options if your condition(s) worsen?: Yes-patient verbalized understanding  SDOH Interventions Today    Flowsheet Row Most Recent Value  SDOH Interventions   Food Insecurity Interventions Intervention Not Indicated  Housing Interventions Intervention Not Indicated  Transportation Interventions Intervention Not Indicated  Utilities Interventions Intervention Not Indicated   Spoke with husband about patients recent discharge. Husband is concerns about urinary frequency and antibiotics not working. Husband reports at the store this am buying diapers because patient can not control her urine. Was using purewick at the hospital with a lot of urinary output. Husband reports about 1.5 gallons of urine since hospital discharge in the last 24 hours.   Husband is managing medications. Has not heard from home health yet. Placed call to home health and I requested patient be seen as soon as possible.  Placed call to MD office and spoke with Chloe, ( nurse) and advised for patient to be seen tomorrow at office. Husband notified.    Reviewed and offered 30 day TOC program and husband has consented. I have provided my contact information and will call husband back in 1 week. Encouraged husband to call me sooner  if needed.    Goals Addressed             This Visit's Progress    [facility] Transitions of Care (TOC) Care Plan       Problems:  Recent Hospitalization for treatment of bowel perforation Home Health services barrier: confirmed that home health will start on 09/06/2023  UTI-  per husband 1 and a half plus gallons of urine in 24 hours. Patient unable to control urination. Wearing diapers and is incontinent. Last antibiotics tonight.  Positive urine culture.  Husband concerns about electrolytes  Goal:  Over the next 30 days, the patient will not experience hospital readmission  Interventions:  Transitions of Care: Durable Medical Equipment (DME) needs assessed with patient/caregiver. Husband confirmed he got all DME that was ordered.  Doctor Visits  - discussed the importance of doctor visits Communication with Chloe at MD office re: urinary frequency and lack of patient control of urine. Contacted provider for patient needs.  Spoke with nurse and note routed to MD.  Arranged PCP follow-up within 7 days (Care Guide Scheduled). Placed call and scheduled patient for tomorrow Contacted Health RN/OT/PT - Placed call to [facility] and confirmed order was received and patient will be seen on 09/06/2023 at 9 am Post discharge activity limitations prescribed by provider reviewed Post-op wound/incision care reviewed with patient/caregiver. Reviewed dressing changes with husband Reviewed Signs and symptoms of infection Reviewed urinary frequency and husbands concern about electrolytes  Surgery (Perforated bowel, kidney stent): Evaluation of current treatment plan related to colostomy and stent surgery assessed patient/caregiver understanding of surgical procedure   reviewed post-operative instructions with patient/caregiver addressed questions about post - surgical incision care  reviewed medications with patient and addressed questions reviewed scheduled provider appointments with patientwill see PCP tomorrow confirmed availability of transportation to all appointments husband to drive contacted provider to discuss urinary frequency and lack of urinary control in setting of complicated UTI  Patient Self Care Activities:  Attend all scheduled provider appointments Call  pharmacy for medication refills 3-7 days in advance of running out of medications Call provider office for new concerns or questions  Notify RN Care Manager of TOC call rescheduling needs Participate in Transition of Care Program/Attend TOC scheduled calls Take medications as prescribed   Stay hydrated See PCP tomorrow Husband to continue to do dressing changes. Call home health for any new concerns. Call MD or seek emergency care if needed.   Plan:  Telephone follow up appointment with care management team member scheduled for:  09/12/2023 The patient has been provided with contact information for the care management team and has been advised to call with any health related questions or concerns.         Alan Ee, RN, BSN, CEN [employer]- Transition of Care Team.  [facility] [phone]

## 2023-09-06 ENCOUNTER — Encounter: Payer: Self-pay | Admitting: Family Medicine

## 2023-09-06 ENCOUNTER — Ambulatory Visit (INDEPENDENT_AMBULATORY_CARE_PROVIDER_SITE_OTHER): Admitting: Family Medicine

## 2023-09-06 VITALS — BP 138/72 | HR 75 | Temp 98.5°F | Wt 118.0 lb

## 2023-09-06 DIAGNOSIS — D72829 Elevated white blood cell count, unspecified: Secondary | ICD-10-CM

## 2023-09-06 DIAGNOSIS — D649 Anemia, unspecified: Secondary | ICD-10-CM | POA: Diagnosis not present

## 2023-09-06 DIAGNOSIS — I2510 Atherosclerotic heart disease of native coronary artery without angina pectoris: Secondary | ICD-10-CM | POA: Diagnosis not present

## 2023-09-06 DIAGNOSIS — I119 Hypertensive heart disease without heart failure: Secondary | ICD-10-CM | POA: Diagnosis not present

## 2023-09-06 DIAGNOSIS — N739 Female pelvic inflammatory disease, unspecified: Secondary | ICD-10-CM | POA: Diagnosis not present

## 2023-09-06 DIAGNOSIS — K921 Melena: Secondary | ICD-10-CM | POA: Diagnosis not present

## 2023-09-06 DIAGNOSIS — N1330 Unspecified hydronephrosis: Secondary | ICD-10-CM | POA: Diagnosis not present

## 2023-09-06 DIAGNOSIS — R300 Dysuria: Secondary | ICD-10-CM | POA: Diagnosis not present

## 2023-09-06 DIAGNOSIS — K631 Perforation of intestine (nontraumatic): Secondary | ICD-10-CM | POA: Diagnosis not present

## 2023-09-06 DIAGNOSIS — E871 Hypo-osmolality and hyponatremia: Secondary | ICD-10-CM

## 2023-09-06 DIAGNOSIS — Z8719 Personal history of other diseases of the digestive system: Secondary | ICD-10-CM | POA: Diagnosis not present

## 2023-09-06 DIAGNOSIS — T85528D Displacement of other gastrointestinal prosthetic devices, implants and grafts, subsequent encounter: Secondary | ICD-10-CM | POA: Diagnosis not present

## 2023-09-06 DIAGNOSIS — K9189 Other postprocedural complications and disorders of digestive system: Secondary | ICD-10-CM | POA: Diagnosis not present

## 2023-09-06 DIAGNOSIS — N136 Pyonephrosis: Secondary | ICD-10-CM | POA: Diagnosis not present

## 2023-09-06 DIAGNOSIS — E872 Acidosis, unspecified: Secondary | ICD-10-CM | POA: Diagnosis not present

## 2023-09-06 DIAGNOSIS — T8143XD Infection following a procedure, organ and space surgical site, subsequent encounter: Secondary | ICD-10-CM | POA: Diagnosis not present

## 2023-09-06 LAB — CBC WITH DIFFERENTIAL/PLATELET
Basophils Absolute: 0 K/uL (ref 0.0–0.1)
Basophils Relative: 0.7 % (ref 0.0–3.0)
Eosinophils Absolute: 0.4 K/uL (ref 0.0–0.7)
Eosinophils Relative: 5.6 % — ABNORMAL HIGH (ref 0.0–5.0)
HCT: 34.3 % — ABNORMAL LOW (ref 36.0–46.0)
Hemoglobin: 11.1 g/dL — ABNORMAL LOW (ref 12.0–15.0)
Lymphocytes Relative: 25.8 % (ref 12.0–46.0)
Lymphs Abs: 1.6 K/uL (ref 0.7–4.0)
MCHC: 32.3 g/dL (ref 30.0–36.0)
MCV: 90.2 fl (ref 78.0–100.0)
Monocytes Absolute: 0.5 K/uL (ref 0.1–1.0)
Monocytes Relative: 8 % (ref 3.0–12.0)
Neutro Abs: 3.8 K/uL (ref 1.4–7.7)
Neutrophils Relative %: 59.9 % (ref 43.0–77.0)
Platelets: 381 K/uL (ref 150.0–400.0)
RBC: 3.8 Mil/uL — ABNORMAL LOW (ref 3.87–5.11)
RDW: 17.8 % — ABNORMAL HIGH (ref 11.5–15.5)
WBC: 6.3 K/uL (ref 4.0–10.5)

## 2023-09-06 LAB — COMPREHENSIVE METABOLIC PANEL WITH GFR
ALT: 21 U/L (ref 0–35)
AST: 19 U/L (ref 0–37)
Albumin: 3.4 g/dL — ABNORMAL LOW (ref 3.5–5.2)
Alkaline Phosphatase: 81 U/L (ref 39–117)
BUN: 17 mg/dL (ref 6–23)
CO2: 30 meq/L (ref 19–32)
Calcium: 8.7 mg/dL (ref 8.4–10.5)
Chloride: 99 meq/L (ref 96–112)
Creatinine, Ser: 0.56 mg/dL (ref 0.40–1.20)
GFR: 92.22 mL/min (ref >60.00)
Glucose, Bld: 99 mg/dL (ref 70–99)
Potassium: 4 meq/L (ref 3.5–5.1)
Sodium: 137 meq/L (ref 135–145)
Total Bilirubin: 0.3 mg/dL (ref 0.2–1.2)
Total Protein: 6 g/dL (ref 6.0–8.3)

## 2023-09-06 LAB — IBC + FERRITIN
Ferritin: 138.8 ng/mL (ref 10.0–291.0)
Iron: 43 ug/dL (ref 42–145)
Saturation Ratios: 17.2 % — ABNORMAL LOW (ref 20.0–50.0)
TIBC: 250.6 ug/dL (ref 250.0–450.0)
Transferrin: 179 mg/dL — ABNORMAL LOW (ref 212.0–360.0)

## 2023-09-06 MED ORDER — CARVEDILOL 6.25 MG PO TABS: 6.2500 mg | ORAL_TABLET | Freq: Two times a day (BID) | ORAL | 5 refills | Status: AC

## 2023-09-06 MED ORDER — METHOCARBAMOL 500 MG PO TABS: 500.0000 mg | ORAL_TABLET | Freq: Two times a day (BID) | ORAL | 1 refills | Status: DC | PRN

## 2023-09-06 MED ORDER — ONDANSETRON 4 MG PO TBDP: 4.0000 mg | ORAL_TABLET | Freq: Three times a day (TID) | ORAL | 0 refills | Status: DC | PRN

## 2023-09-06 MED ORDER — PANTOPRAZOLE SODIUM 40 MG PO TBEC: 40.0000 mg | DELAYED_RELEASE_TABLET | Freq: Every day | ORAL | 1 refills | Status: AC

## 2023-09-06 MED ORDER — SULFAMETHOXAZOLE-TRIMETHOPRIM 800-160 MG PO TABS: 1.0000 | ORAL_TABLET | Freq: Two times a day (BID) | ORAL | 0 refills | Status: DC

## 2023-09-06 MED ORDER — LISINOPRIL 10 MG PO TABS: 10.0000 mg | ORAL_TABLET | Freq: Every day | ORAL | 1 refills | Status: AC

## 2023-09-06 NOTE — Progress Notes (Signed)
 Angela [name] , 1950/02/23, 73 y.o., female MRN: [id number] Patient Care Team    Relationship Specialty Notifications Start End  Angela [name] LABOR, DO PCP - General Family Medicine  04/10/17   Angela Fallow, MD Consulting Physician Gastroenterology  06/19/19   Angela Angela PENNER, RN [facility] Care Management   09/05/23   Angela Arlyss CROME, MD Consulting Physician Urology  09/06/23   Angela [name] LABOR, MD Consulting Physician General Surgery  09/06/23     Chief Complaint  Patient presents with   Hospitalization Follow-up     Subjective:  Angela [name]  [name] a 73 y.o. female presents for hospital follow up after recent admission on 07/30/2023 for primary diagnosis bowel perforation. Angela [name] was discharged on 09/04/2023 to home. Patients discharge summary has been reviewed, as well as all labs/image studies obtained during hospitalization.  Medication reconciliation completed today. Discharge Diagnoses:  Principal Problem:   Bowel perforation (HCC) Active Problems:   Hydroureteronephrosis   Chronic idiopathic constipation   Metabolic acidosis with increased anion gap and accumulation of organic acids   Hyponatremia   Essential hypertension   Glaucoma   Perforated sigmoid colon (HCC)   Protein-calorie malnutrition, severe  Patients hospital course: Angela [name] a 73 year old female presented to the emergency room with worsening abdominal pain.  Patient was found to have a sigmoid perforation with flocculent peritonitis in the setting of colitis.  Patient underwent sigmoid ectomy with end colostomy on 07/30/2023.  She had a complicated postop course with an ileus and a residual abscess of the pelvis requiring IR drain placement. 09/01/2023 urine culture positive for E. coli, protease and Citrobacter-all sensitive to Bactrim   Since hospital discharge patient reports she finished her Bactrim  DS yesterday and dysuria worsened today.  She had felt that it was improving after hospital discharge.  She denies  fevers or chills.  She [name] tolerating p.o.  Still not helpful at [name] good.  Home health started today and she has had dressing changes this morning. She reports she just [name] still very weak using a walker to ambulate. She noticed that she [name] eating more room temperature or cold foods such as cheese, yogurt and soup.  She [name] trying to eat something small every 2 hours.  She [name] taking stool softeners as directed. She [name] washing the drain daily as recommended and reporting drainage. She will require outpatient IR, surgery and urology follow-ups.  Significant events: 6/21>> evaluated in the ED for abdominal pain-found to have left mid ureteral stone-9 mm with hydronephrosis-and large volume of stool-managed with supportive care and discharged home. 6/22>> worsening abdominal pain-repeat CT scan consistent with bowel perforation-surgery consult-admit to [facility]. 6/29>> CT abdomen with pelvic abscess. 6/30>> CT-guided drain and pelvic abscess by IR 7/04>> vomiting-recurrent ileus-NG tube reinserted 7/05>> TNA started-repeat CT abdomen increased size of abscess. 7/07>> IR placed RLQ drain and right transgluteal pelvic drain. 7/10>> NGT removed 7/11 left ureteral obstructing stone, s/p cystoscopy with L ureteral stent placement 7/18 right lower quadrant drainage catheter removed, right transgluteal approach drainage cathter left in place 7/23 repositioning of slightly displaced percutaneous drainage cathter 7/27 TPN discontinued   Recent Labs  Lab 09/01/23 0324 09/02/23 0350 09/04/23 0340  HGB 10.3* 9.9* 9.8*  HCT 31.9* 31.1* 31.3*  WBC 6.1 5.3 5.9  PLT 328 305 312      Latest Ref Rng & Units 09/04/2023    3:40 AM 09/02/2023    3:50 AM 09/01/2023    3:24 AM  CMP  Glucose 70 - 99 mg/dL 94  94  897   BUN 8 - 23 mg/dL 13  16  20    Creatinine 0.44 - 1.00 mg/dL 9.50  9.44  9.44   Sodium 135 - 145 mmol/L 137  136  137   Potassium 3.5 - 5.1 mmol/L 3.4  3.9  3.6   Chloride 98 - 111 mmol/L 103  102   103   CO2 22 - 32 mmol/L 27  25  27    Calcium  8.9 - 10.3 mg/dL 8.5  8.4  8.6   Total Protein 6.5 - 8.1 g/dL  5.1  5.3   Total Bilirubin 0.0 - 1.2 mg/dL  0.3  0.6   Alkaline Phos 38 - 126 U/L  59  70   AST 15 - 41 U/L  14  14   ALT 0 - 44 U/L  11  12        IR Catheter Tube Change Result Date: 08/30/2023  IMPRESSION: 1. Repositioning of the slightly displaced percutaneous drainage catheter within the center of the residual fluid collection. 2. No definite fistula identified. 3. Continue percutaneous catheter drainage and routine drain management.  CT ABDOMEN PELVIS W CONTRAST Result Date: 08/29/2023 FINDINGS: Lower chest: Bibasal subpleural atelectasis. There [name] coronary vascular calcification. No intra-abdominal free air.  Trace subhepatic free fluid. Hepatobiliary: Small cyst in the left lobe of the liver. No biliary dilatation. The gallbladder [name] unremarkable. Pancreas: Unremarkable. No pancreatic ductal dilatation or surrounding inflammatory changes. Spleen: Normal in size without focal abnormality. Adrenals/Urinary Tract: The adrenal glands are unremarkable. Mild left hydronephrosis, progressed since the prior CT. Left ureteral stent with proximal tip in the interpolar collecting system and distal end in the urinary bladder. There [name] a 1 cm stone in the distal left ureter along the catheter [name] similar location as the prior CT. No hydronephrosis on the right. Small bilateral renal cysts or dilated calyx. The urinary bladder [name] grossly unremarkable. Stomach/Bowel: Postsurgical changes of the bowel with a left anterior colostomy. Mild dilatation of several small bowel loops measure up to approximately 3 cm. A gradual transition in the anterior right pelvis noted. Findings favor to represent an ileus. A small-bowel obstruction secondary to peritoneal adhesions [name] considered less likely. Small-bowel series may provide better evaluation if clinically indicated. The appendix [name] unremarkable as  visualized. Vascular/Lymphatic: Advanced aortoiliac atherosclerotic disease. The IVC [name] unremarkable. No portal venous gas. There [name] no adenopathy. Reproductive: The uterus [name] grossly unremarkable. No suspicious adnexal masses. Other: Right transgluteal pigtail drainage catheter with tip in the posterior pelvis. There [name] a 5.3 x 2.9 cm loculated collection just superior to the pigtail tip of the catheter in the posterior pelvis, relatively similar to prior CT. There has been interval removal of the right anterior percutaneous drainage catheter. Musculoskeletal: Degenerative changes of the spine. No acute osseous pathology. Midline vertical anterior pelvic wall open wound.   IMPRESSION: 1. Right transgluteal pigtail drainage catheter with tip in the posterior pelvis. A 5.3 x 2.9 cm loculated collection just superior to the pigtail tip of the catheter in the posterior pelvis, relatively similar to prior CT. 2. Mild left hydronephrosis, progressed since the prior CT. Left ureteral stent in place. A 1 cm stone in the distal left ureter along the catheter [name] similar location as the prior CT. 3. Postsurgical changes of the bowel with a left anterior colostomy. Mild dilatation of several small bowel loops favor to represent an ileus. A small-bowel obstruction secondary to peritoneal adhesions [name]  considered less likely. Small-bowel series may provide better evaluation if clinically indicated. 4.  Aortic Atherosclerosis (ICD10-I70.0). Electronically Signed   By: Vanetta Chou M.D.   On: 08/29/2023 14:33   DG Abd Portable 1V Result Date: 08/29/2023 . IMPRESSION: NG tube tip [name] positioned in the proximal stomach. Side port of the NG tube [name] in the region of the GE junction and tube could be advanced 3-4 cm to ensure side port placement below the GE junction as clinically Diffuse gaseous bowel distension. Electronically Signed   By: Camellia Candle M.D.   On: 08/29/2023 07:02   DG Abd Portable 1V Result Date:  08/28/2023 CLINICAL DATA:  73 year old female status post exploratory laparotomy. Abdominal pain. Small-bowel obstruction. EXAM: PORTABLE ABDOMEN - 1 VIEW COMPARISON:  Abdomen radiographs yesterday and earlier. FINDINGS: Portable AP supine view at 0724 hours. Stable enteric tube, left double-J ureteral stent, right percutaneous pelvic drain. Non obstructed bowel gas pattern. Stable visualized osseous structures. Negative lung bases. IMPRESSION: 1. Non obstructed bowel gas pattern. 2.  Stable lines and tubes. Electronically Signed   By: VEAR Hurst M.D.   On: 08/28/2023 07:44   DG Abd Portable 1V  IMPRESSION: 1. Large bowel oral contrast has continued distally. Mildly dilated left abdominal small bowel loops, but gas pattern still improved compared to recent CT Abdomen and Pelvis. 2. Stable lines and tubes. Electronically Signed   By: VEAR Hurst M.D.   On: 08/27/2023 06:59   DG Abd Portable 1V Result Date: 08/26/2023 IMPRESSION: 1. Satisfactory enteric tube placement into the stomach. Otherwise stable lines and tubes. 2. Improving bowel gas pattern since the CT on 08/23/2023. Stable oral contrast in the right colon.   CT GUIDED PERITONEAL/RETROPERITONEAL FLUID DRAIN BY PERC CATH Addendum Date: 08/25/2023 ADDENDUM REPORT: 08/25/2023 13:50 ADDENDUM: This procedure was performed on 08/14/2023.  Result Date: 08/25/2023  FINDINGS: CT demonstrated multiple fluid collections in lower abdomen and pelvis. Large collections were in the right lower quadrant and in the pelvis. The existing transgluteal drain has retracted internally and no longer within the pelvic collection. Right lower quadrant drain was placed and greater than 400 mL of tan colored fluid was removed. New right transgluteal pelvic drain was placed. 100 mL of tan colored fluid was removed.  IMPRESSION: 1. CT-guided placement of a right lower quadrant abdominal abscess drain. 2. Removal of the old right transgluteal pelvic drain. 3. CT-guided placement of a  new right transgluteal pelvic drain.   CT GUIDED PERITONEAL/RETROPERITONEAL FLUID DRAIN BY PERC CATH Addendum Date: 08/25/2023 ADDENDUM REPORT: 08/25/2023 13:50 ADDENDUM: This procedure was performed on 08/14/2023.   Result Date: 08/25/2023  IMPRESSION: 1. CT-guided placement of a right lower quadrant abdominal abscess drain. 2. Removal of the old right transgluteal pelvic drain. 3. CT-guided placement of a new right transgluteal pelvic drain.  IR INJECT INDWELLING DRAINAGE CATHETER Result Date: 08/25/2023 iMPRESSION: 1. No significant residual abscess collection or obvious fistulous communication. 2. The RIGHT lower quadrant drainage catheter was removed. 3. The RIGHT transgluteal approach drainage catheter was left in place, secondary to residual output volume. No significant abscess collection or obvious fistulous communication noted on fluoroscopic evaluation.   DG Abd Portable 1V Result Date: 08/25/2023 IMPRESSION: 1. Persistent dilated loops of small bowel in the left lower quadrant, measuring up to 4.1 cm, previously 4.0 cm, consistent with small bowel obstruction. 2. Enteric tube with tip approximately 6.2 cm below the GE junction and side port 1.5 cm above the GE junction. Recommend advancing enteric tube. 3. Left-sided  nephroureteral stent and right lower quadrant approach pigtail drainage catheter with distal end in the right paramidline pelvis.  DG Abd Portable 1V Result Date: 08/24/2023 CLINICAL DATA:  [id number] Encounter for nasogastric (NG) tube placement [id number] EXAM: PORTABLE ABDOMEN - 1 VIEW COMPARISON:  None Available. FINDINGS: Esophagogastric tube terminates at the GE junction. Nonobstructive bowel gas pattern. No pneumoperitoneum. Partially visualized left ureteral stent. No acute fracture or destructive lesion. The lung bases are clear. IMPRESSION: Esophagogastric tube terminates in the region of the GE junction. The last side hole [name] in the distal esophagus and continued advancement  [name] recommended. Electronically Signed   By: Rogelia Myers M.D.   On: 08/24/2023 11:48   DG Abd Portable 1V Result Date: 08/24/2023 CLINICAL DATA:  Evaluate small bowel obstruction. History of bowel perforation. EXAM: PORTABLE ABDOMEN - 1 VIEW COMPARISON:  08/23/2023 FINDINGS: There are 2 pigtail drainage catheters within the right paramidline pelvis. Left-sided nephroureteral stent [name] in place. Persistent dilated small bowel loops are identified within the left hemiabdomen which appears similar to the previous exam measuring up to 4 cm. IMPRESSION: 1. Persistent dilated small bowel loops within the left hemiabdomen compatible with small bowel obstruction. Electronically Signed   By: Waddell Calk M.D.   On: 08/24/2023 06:55   CT ABDOMEN PELVIS W CONTRAST Result Date: 08/23/2023 IMPRESSION: 1. Small-bowel obstruction with transition point in the right lower quadrant. 2. Left lower quadrant colostomy. 3. Percutaneous drainage catheter in the anterior right pelvis without associated fluid collection. 4. Percutaneous drainage catheter in the posterior sacroiliac region ending in the cul-de-sac. There [name] no definite fluid collection at the tip of the catheter; however, there [name] a enhancing fluid collection containing air in the presacral region measuring 5.8 x 2.3 x 1.2 cm. 5. Enhancing fluid collection containing air in the lateral left and anterior pelvis has significantly decreased in size. The largest portion of the collection [name] now seen on the left measuring 1.4 x 5.0 cm. 6. Left ureteral stent in place. No hydronephrosis. 7. Small amount of air in the bladder, possibly related to recent instrumentation. Aortic Atherosclerosis (ICD10-I70.0).  DG Abd Portable 1V Result Date: 08/23/2023 IMPRESSION: 1. Diffuse gaseous distension of the bowel, similar when compared to the prior exam.   DG C-Arm 1-60 Min Result Date: 08/18/2023 IMPRESSION: Contrast within the dilated ureter collecting system during  retrograde pyelogram. Dilated loops of small bowel similar to 08/12/2023.   CT ABDOMEN PELVIS W CONTRAST Result Date: 08/12/2023 FINDINGS: Lower chest: Trace left pleural effusion and mild bibasilar atelectasis again noted, similar to previous CT. There [name] atherosclerosis of the aorta and coronary arteries. Hepatobiliary: The liver [name] normal in density without suspicious focal abnormality. Stable hepatic cysts. No evidence of gallstones, gallbladder wall thickening or biliary dilatation. Pancreas: Unremarkable. No pancreatic ductal dilatation or surrounding inflammatory changes. Spleen: Normal in size without focal abnormality. Adrenals/Urinary Tract: Both adrenal glands appear normal. There [name] a persistent partially obstructing calculus in the distal left ureter, measuring 11 mm on coronal image 65/7. Resulting hydronephrosis and hydroureter with delayed contrast excretion. No evidence of right ureteral obstruction. There are small renal cysts bilaterally for which no specific follow-up imaging [name] recommended. The bladder appears unremarkable for its degree of distention. Stomach/Bowel: No enteric contrast administered. Nasogastric tube extends into the mid stomach. The stomach [name] decompressed. Multiple fluid-filled loops of dilated small bowel are again noted, without focal transition point or significant wall thickening. The appendix [name] not clearly visualized, although no pericecal inflammatory changes are identified.  Colonic stool burden has improved compared with the previous study. The colon [name] relatively decompressed proximal to the sigmoid colostomy. The rectal pouch [name] fluid-filled and mildly distended. Vascular/Lymphatic: There are no enlarged abdominal or pelvic lymph nodes. Aortic and branch vessel atherosclerosis without evidence of aneurysm or large vessel occlusion. The portal, superior mesenteric and splenic veins are patent. Reproductive: The uterus and ovaries appear unremarkable. No evidence of  adnexal mass. Other: Open midline anterior abdominal wall incision. New right transgluteal percutaneous drain appears appropriately position within the previously demonstrated complex pelvic fluid collection. However, this collection remains sizable and has slightly enlarged compared with the previous study, measuring 8.0 x 6.7 cm on image 65/4. This collection likely communicates with an adjacent collection along the left pelvic sidewall, measuring 7.0 x 2.9 cm on image 68/4 and abuts the rectal pouch suture line. There are other less well-defined but enlarging peritoneal fluid collections with associated air bubbles and peritoneal enhancement, highly suspicious for peritonitis. A component inferior to the cecum measures up to 8.0 x 6.0 cm on image 58/4, and there [name] a component inferior to the colostomy measuring 7.4 x 5.3 cm on image 52/4. Musculoskeletal: No acute or significant osseous findings. Multilevel spondylosis. No evidence of discitis or osteomyelitis.   IMPRESSION: 1. New right transgluteal percutaneous drain appears appropriately positioned within the previously demonstrated complex pelvic fluid collection. However, this collection remains sizable and has slightly enlarged compared with the previous study. This abuts the rectal suture line and a leak in this area cannot be excluded. 2. There are other less well-defined but enlarging peritoneal fluid collections with associated air bubbles and peritoneal enhancement, highly suspicious for peritonitis. 3. Persistent partially obstructing 11 mm calculus in the distal left ureter with resulting hydronephrosis and hydroureter. 4. Persistent fluid-filled loops of dilated small bowel without focal transition point, likely ileus. 5. No other significant parenchymal findings. 6.  Aortic Atherosclerosis (ICD10-I70.0). 7. These results will be called to the ordering clinician or representative by the Radiologist Assistant, and communication documented in the  PACS or [employer]. Electronically Signed   By: Elsie Perone M.D.   On: 08/12/2023 19:06    DG Abd 2 Views Result Date: 08/12/2023  IMPRESSION: Persistent dilated loops of small bowel within the left hemiabdomen, similar to previous exam.  DG Abd 1 View Result Date: 08/11/2023  IMPRESSION: NG tube tip in the stomach. Gaseous distension of multiple small bowel loops, similar to prior study.   DG Abd 1 View Result Date: 08/11/2023 IMPRESSION: Few air dilated loops of small bowel with scattered colon gas, suggesting ileus, findings do not appear greatly changed compared to scout image from the CT 08/06/2023.  CT GUIDED PERITONEAL/RETROPERITONEAL FLUID DRAIN BY PERC CATH Result Date: 08/07/2023 IMPRESSION: CT-guided placement of a drain in the pelvic abscess.      06/23/2023    1:12 PM 04/19/2023    8:57 AM 02/22/2023    7:48 AM 04/13/2022    8:37 AM 03/24/2021    1:49 PM  Depression screen PHQ 2/9  Decreased Interest 0 0 0 0 0  Down, Depressed, Hopeless 0 0 0 0 0  PHQ - 2 Score 0 0 0 0 0  Altered sleeping 0 0     Tired, decreased energy 0 0     Change in appetite 0 0     Feeling bad or failure about yourself  0 0     Trouble concentrating 0 0     Moving slowly or fidgety/restless 0  0     Suicidal thoughts 0 0     PHQ-9 Score 0 0     Difficult doing work/chores Not difficult at all Not difficult at all       Allergies  Allergen Reactions   Codeine Other (See Comments)    Irritable , insomnia    Social History   Tobacco Use   Smoking status: Former    Current packs/day: 0.00    Types: Cigars, Cigarettes   Smokeless tobacco: Never   Tobacco comments:    *occasional smoker   Substance Use Topics   Alcohol use: Yes    Alcohol/week: 4.0 standard drinks of alcohol    Types: 4 Glasses of wine per week    Comment: *rarely    Past Medical History:  Diagnosis Date   Arthritis    Blood in stool    greater than 4 years since last incidence of blood in stool    Colon  polyps    Glaucoma    Hydroureteronephrosis 07/2023   Hyperlipidemia    Hypertension    Inclusion cyst 2021   face   Perforation of sigmoid colon (HCC) 07/2023   Colitis   PNA (pneumonia) 2006   Sepsis (HCC) 07/2023   Strain of left wrist 04/16/2019   Past Surgical History:  Procedure Laterality Date   ABSCESS DRAINAGE  2021   face- inclusion cyst   BREAST BIOPSY Left 07/19/2018   US  guided breast bx with clip placement- benign   COLON RESECTION SIGMOID  07/30/2023   Procedure: COLECTOMY, SIGMOID, OPEN;  Surgeon: Angela [name] LABOR, MD;  Location: [facility] OR;  Service: General;;   CYSTOSCOPY W/ URETERAL STENT PLACEMENT Left 08/18/2023   Procedure: CYSTOSCOPY, WITH RETROGRADE PYELOGRAM AND URETERAL STENT INSERTION;  Surgeon: Angela Arlyss CROME, MD;  Location: [facility] OR;  Service: Urology;  Laterality: Left;   EYE SURGERY  1956   HEMORRHOID SURGERY  2001   IR CATHETER TUBE CHANGE  08/30/2023   IR CV LINE INJECTION  08/24/2023   LAPAROTOMY N/A 07/30/2023   Procedure: LAPAROTOMY, EXPLORATORY;  Surgeon: Angela [name] LABOR, MD;  Location: [facility] OR;  Service: General;  Laterality: N/A;   RADIOACTIVE SEED GUIDED EXCISIONAL BREAST BIOPSY Left 03/15/2019   Procedure: RADIOACTIVE SEED GUIDED EXCISIONAL LEFT  BREAST BIOPSY;  Surgeon: Gladis Cough, MD;  Location: [facility];  Service: General;  Laterality: Left;   TONSILLECTOMY  1963   WISDOM TOOTH EXTRACTION     Family History  Problem Relation Age of Onset   Heart attack Father    Heart attack Sister    Hyperlipidemia Sister    Hypertension Sister    Allergies as of 09/06/2023       Reactions   Codeine Other (See Comments)   Irritable , insomnia         Medication List        Accurate as of September 06, 2023 12:29 PM. If you have any questions, ask your nurse or doctor.          STOP taking these medications    atorvastatin  20 MG tablet Commonly known as: LIPITOR Stopped by: [name] Bellini   FIBER-CAPS PO Stopped by:  [name] Bellini       TAKE these medications    acetaminophen  500 MG tablet Commonly known as: TYLENOL  Take 2 tablets (1,000 mg total) by mouth every 6 (six) hours as needed.   albuterol  108 (90 Base) MCG/ACT inhaler Commonly known as: VENTOLIN  HFA Inhale 2 puffs into  the lungs every 6 (six) hours as needed for wheezing or shortness of breath.   carvedilol  6.25 MG tablet Commonly known as: COREG  Take 1 tablet (6.25 mg total) by mouth 2 (two) times daily with a meal.   DAILY PROBIOTIC PO Take 1 capsule by mouth daily at 12 noon.   FeroSul 325 (65 Fe) MG tablet Generic drug: ferrous sulfate  Take 1 tablet (325 mg total) by mouth every other day. Follow your iron and anemia with your PCP outpatient   latanoprost  0.005 % ophthalmic solution Commonly known as: XALATAN  Place 1 drop into both eyes at bedtime.   lisinopril  10 MG tablet Commonly known as: ZESTRIL  Take 1 tablet (10 mg total) by mouth daily.   methocarbamol  500 MG tablet Commonly known as: ROBAXIN  Take 1 tablet (500 mg total) by mouth 2 (two) times daily as needed for muscle spasms. Started by: Angela [name]   multivitamin tablet Take 1 tablet by mouth daily.   Normal Saline Flush 0.9 % Soln Flush drain with 5 cc daily for 14 days.  Record output.   ondansetron  4 MG disintegrating tablet Commonly known as: ZOFRAN -ODT Take 1 tablet (4 mg total) by mouth every 8 (eight) hours as needed for nausea or vomiting.   oxyCODONE  5 MG immediate release tablet Commonly known as: Roxicodone  Take 1 tablet (5 mg total) by mouth every 6 (six) hours as needed for up to 5 days.   pantoprazole  40 MG tablet Commonly known as: PROTONIX  Take 1 tablet (40 mg total) by mouth daily.   polyethylene glycol powder 17 GM/SCOOP powder Commonly known as: GLYCOLAX /MIRALAX  Take 17 g by mouth daily.   sulfamethoxazole -trimethoprim  800-160 MG tablet Commonly known as: Bactrim  DS Take 1 tablet by mouth 2 (two) times daily for 7 days. Your  last doses of antibiotics for your urine infection will be on 7/29.   timolol  0.5 % ophthalmic solution Commonly known as: TIMOPTIC  Place 1 drop into both eyes 2 (two) times daily.        All past medical history, surgical history, allergies, family history, immunizations and medications were updated in the EMR today and reviewed under the history and medication portions of their EMR.      ROS: Negative, with the exception of above mentioned in HPI   Objective:  BP 138/72   Pulse 75   Temp 98.5 F (36.9 C)   Wt 118 lb (53.5 kg)   SpO2 96%   BMI 20.25 kg/m  Body mass index [name] 20.25 kg/m. Physical Exam Vitals and nursing note reviewed.  Constitutional:      General: She [name] not in acute distress.    Appearance: Normal appearance. She [name] not ill-appearing, toxic-appearing or diaphoretic.     Comments: Ambulating well with walker.  HENT:     Head: Normocephalic and atraumatic.  Eyes:     General: No scleral icterus.       Right eye: No discharge.        Left eye: No discharge.     Extraocular Movements: Extraocular movements intact.     Conjunctiva/sclera: Conjunctivae normal.     Pupils: Pupils are equal, round, and reactive to light.  Cardiovascular:     Rate and Rhythm: Normal rate and regular rhythm.  Pulmonary:     Effort: Pulmonary effort [name] normal. No respiratory distress.     Breath sounds: Normal breath sounds. No wheezing, rhonchi or rales.  Abdominal:     General: Abdomen [name] flat. There [name] no distension.  Palpations: Abdomen [name] soft. There [name] no mass.     Tenderness: There [name] no abdominal tenderness. There [name] no guarding or rebound.     Comments: Midline dressing intact, packed incision.  stoma-ostomy-  dark brown stool.  Stoma appears healthy.  No erythema surrounding stoma.  Stable abdominal discomfort.  Right buttock JP with purulent output.  Musculoskeletal:     Right lower leg: No edema.     Left lower leg: No edema.  Skin:    General: Skin [name]  warm and dry.     Findings: No rash.  Neurological:     Mental Status: She [name] alert and oriented to person, place, and time. Mental status [name] at baseline.     Motor: No weakness.     Gait: Gait normal.  Psychiatric:        Mood and Affect: Mood normal.        Behavior: Behavior normal.        Thought Content: Thought content normal.        Judgment: Judgment normal.       Assessment/Plan: Angela [name] [name] a 73 y.o. female present for OV for Hospital discharge follow up Hematochezia/hyponatremia/hypokalemia/leukocytosis - CBC w/Diff - Comp Met (CMET) - IBC + Ferritin - Urinalysis w microscopic + reflex cultur  Dysuria Extended Bactrim  DS while waiting on urine culture  Perforated sigmoid colon (HCC) (Primary)/hydroureter nephrosis - CBC w/Diff - Comp Met (CMET) - IBC + Ferritin - Urinalysis w microscopic + reflex cultur -Diet: Encouraged her to slowly advance her diet as tolerated and ensure to continue monitoring stool output, continuing MiraLAX  and stool softeners. -Activity: She [name] encouraged to slowly increase her activity.  She will take some time and working with physical therapy in order to regain her strength.   -Home health has started her visits today with dressing changes.  Midline dressing change [name] twice daily and recommendations for future dressing changes will come from surgery. -Drain dressing change (right buttock) every 2-3 days or earlier if soiled. -Labs: CBC with differential, CMP and iron collected today.  Patient [name] currently on iron supplement for 2 weeks. -Meds: Refilled Protonix , Zofran .  Restarted Robaxin  twice daily as needed, especially with physical therapy.  Hypertension meds refilled. -Patient complains of recurrent dysuria, despite finishing her antibiotics yesterday.  Extended Bactrim  DS twice daily x 7 days while we wait on urine culture. -Patient encouraged to call IR, surgery and urology to make follow-up appointments today.  They have the  contact information for each at home.  Reviewed expectations re: course of current medical issues. Discussed self-management of symptoms. Outlined signs and symptoms indicating need for more acute intervention. Patient verbalized understanding and all questions were answered. Patient received an After-Visit Summary. Any changes in medications were reviewed and patient was provided with updated med list with their AVS.     Orders Placed This Encounter  Procedures   CBC w/Diff   Comp Met (CMET)   IBC + Ferritin   Urinalysis w microscopic + reflex cultur    Meds ordered this encounter  Medications   pantoprazole  (PROTONIX ) 40 MG tablet    Sig: Take 1 tablet (40 mg total) by mouth daily.    Dispense:  90 tablet    Refill:  1   lisinopril  (ZESTRIL ) 10 MG tablet    Sig: Take 1 tablet (10 mg total) by mouth daily.    Dispense:  90 tablet    Refill:  1   sulfamethoxazole -trimethoprim  (BACTRIM  DS)  800-160 MG tablet    Sig: Take 1 tablet by mouth 2 (two) times daily for 7 days. Your last doses of antibiotics for your urine infection will be on 7/29.    Dispense:  14 tablet    Refill:  0   ondansetron  (ZOFRAN -ODT) 4 MG disintegrating tablet    Sig: Take 1 tablet (4 mg total) by mouth every 8 (eight) hours as needed for nausea or vomiting.    Dispense:  20 tablet    Refill:  0   carvedilol  (COREG ) 6.25 MG tablet    Sig: Take 1 tablet (6.25 mg total) by mouth 2 (two) times daily with a meal.    Dispense:  60 tablet    Refill:  5   methocarbamol  (ROBAXIN ) 500 MG tablet    Sig: Take 1 tablet (500 mg total) by mouth 2 (two) times daily as needed for muscle spasms.    Dispense:  60 tablet    Refill:  1    Note [name] dictated utilizing voice recognition software. Although note has been proof read prior to signing, occasional typographical errors still can be missed. If any questions arise, please do not hesitate to call for verification.   electronically signed by:  [name] Bellini, DO   [facility] - [place]

## 2023-09-07 ENCOUNTER — Telehealth: Payer: Self-pay

## 2023-09-07 ENCOUNTER — Ambulatory Visit: Payer: Self-pay | Admitting: Family Medicine

## 2023-09-07 DIAGNOSIS — Z960 Presence of urogenital implants: Secondary | ICD-10-CM

## 2023-09-07 DIAGNOSIS — K631 Perforation of intestine (nontraumatic): Secondary | ICD-10-CM

## 2023-09-07 DIAGNOSIS — N1330 Unspecified hydronephrosis: Secondary | ICD-10-CM

## 2023-09-07 DIAGNOSIS — A491 Streptococcal infection, unspecified site: Secondary | ICD-10-CM

## 2023-09-07 NOTE — Telephone Encounter (Signed)
 Communication  Caller/Agency: Karna from [place]    Callback Number: [phone]    Service Requested: Occupational Therapy, Physical Therapy, Skilled Nursing, and HHA    Frequency: 2x week for 2 weeks for nursing, once a week thereafter. PT and OT just have vowels, not in yet. HHA is once per week for 4-5 weeks    Any new concerns about the patient? Yes, patient just got out of the hospital with sepsis and a bowel perforation and has colostomy now. Has abdominal wound they are going to be instructing and helping with. Everything else is going good.      Okay for orders?

## 2023-09-08 ENCOUNTER — Other Ambulatory Visit: Payer: Self-pay | Admitting: General Surgery

## 2023-09-08 DIAGNOSIS — I119 Hypertensive heart disease without heart failure: Secondary | ICD-10-CM | POA: Diagnosis not present

## 2023-09-08 DIAGNOSIS — K9189 Other postprocedural complications and disorders of digestive system: Secondary | ICD-10-CM | POA: Diagnosis not present

## 2023-09-08 DIAGNOSIS — I2510 Atherosclerotic heart disease of native coronary artery without angina pectoris: Secondary | ICD-10-CM | POA: Diagnosis not present

## 2023-09-08 DIAGNOSIS — N136 Pyonephrosis: Secondary | ICD-10-CM | POA: Diagnosis not present

## 2023-09-08 DIAGNOSIS — K631 Perforation of intestine (nontraumatic): Secondary | ICD-10-CM

## 2023-09-08 DIAGNOSIS — T85528D Displacement of other gastrointestinal prosthetic devices, implants and grafts, subsequent encounter: Secondary | ICD-10-CM | POA: Diagnosis not present

## 2023-09-08 DIAGNOSIS — N739 Female pelvic inflammatory disease, unspecified: Secondary | ICD-10-CM | POA: Diagnosis not present

## 2023-09-08 DIAGNOSIS — E872 Acidosis, unspecified: Secondary | ICD-10-CM | POA: Diagnosis not present

## 2023-09-08 DIAGNOSIS — T8143XD Infection following a procedure, organ and space surgical site, subsequent encounter: Secondary | ICD-10-CM | POA: Diagnosis not present

## 2023-09-08 DIAGNOSIS — D649 Anemia, unspecified: Secondary | ICD-10-CM | POA: Diagnosis not present

## 2023-09-10 ENCOUNTER — Encounter: Payer: Self-pay | Admitting: Family Medicine

## 2023-09-11 ENCOUNTER — Ambulatory Visit: Payer: Self-pay

## 2023-09-11 DIAGNOSIS — I119 Hypertensive heart disease without heart failure: Secondary | ICD-10-CM | POA: Diagnosis not present

## 2023-09-11 DIAGNOSIS — N739 Female pelvic inflammatory disease, unspecified: Secondary | ICD-10-CM | POA: Diagnosis not present

## 2023-09-11 DIAGNOSIS — I2510 Atherosclerotic heart disease of native coronary artery without angina pectoris: Secondary | ICD-10-CM | POA: Diagnosis not present

## 2023-09-11 DIAGNOSIS — T8143XD Infection following a procedure, organ and space surgical site, subsequent encounter: Secondary | ICD-10-CM | POA: Diagnosis not present

## 2023-09-11 DIAGNOSIS — N136 Pyonephrosis: Secondary | ICD-10-CM | POA: Diagnosis not present

## 2023-09-11 DIAGNOSIS — T85528D Displacement of other gastrointestinal prosthetic devices, implants and grafts, subsequent encounter: Secondary | ICD-10-CM | POA: Diagnosis not present

## 2023-09-11 DIAGNOSIS — E872 Acidosis, unspecified: Secondary | ICD-10-CM | POA: Diagnosis not present

## 2023-09-11 DIAGNOSIS — K9189 Other postprocedural complications and disorders of digestive system: Secondary | ICD-10-CM | POA: Diagnosis not present

## 2023-09-11 DIAGNOSIS — D649 Anemia, unspecified: Secondary | ICD-10-CM | POA: Diagnosis not present

## 2023-09-11 MED ORDER — CEFDINIR 300 MG PO CAPS: 300.0000 mg | ORAL_CAPSULE | Freq: Two times a day (BID) | ORAL | 0 refills | Status: DC

## 2023-09-11 NOTE — Telephone Encounter (Signed)
Spoke with Angela [name]

## 2023-09-11 NOTE — Telephone Encounter (Signed)
 FYI Only or Action Required?: Action required by provider: clinical question for provider and update on patient condition.  Patient was last seen in primary care on 09/06/2023 by Catherine Fuller A, DO.  Called Nurse Triage reporting urinary symptoms.  Symptoms began several weeks ago.  Interventions attempted: Prescription medications: Bactrim  and Rest, hydration, or home remedies.  Symptoms are: unchanged.  Triage Disposition: See Physician Within 24 Hours  Patient/caregiver understands and will follow disposition?: No, wishes to speak with PCP   Copied from CRM #[id number]. Topic: Clinical - Red Word Triage >> Sep 11, 2023  9:26 AM Frederich PARAS wrote: [facility] that prompted transfer to Nurse Triage: uti   PT Calling she has a a raging UTI , Reason for Disposition  [1] Taking antibiotic > 72 hours (3 days) for UTI AND [2] painful urination or frequency is SAME (unchanged, not better)  Answer Assessment - Initial Assessment Questions 1. MAIN SYMPTOM: What is the main symptom you are concerned about? (e.g., painful urination, urine frequency)     Continue burning and stinging. Frequency Q 10 minutes.  2. BETTER-SAME-WORSE: Are you getting better, staying the same, or getting worse compared to how you felt at your last visit to the doctor (most recent medical visit)?     Same, no change, denies new symptoms.  3. PAIN: How bad is the pain?  (e.g., Scale 1-10; mild, moderate, or severe)     Same 4. FEVER: Do you have a fever? If Yes, ask: What is it, how was it measured, and when did it start?     Denies 5. OTHER SYMPTOMS: Do you have any other symptoms? (e.g., blood in the urine, flank pain, vaginal discharge)     Denies  6. DIAGNOSIS: When was the UTI diagnosed? By whom? Was it a kidney infection, bladder infection or both?     Developed while hospitalized  7. ANTIBIOTIC: What antibiotic(s) are you taking? How many times per day?     Bactrim   8. ANTIBIOTIC - START  DATE: When did you start taking the antibiotic?     09/06/23  Additional info: Calling for urine culture results and advise on continued UTI symptoms. Per chart review culture is pending. Patient remains on Bactrim . Please advise.  Protocols used: Urinary Tract Infection on Antibiotic Follow-up Call - [facility]

## 2023-09-11 NOTE — Telephone Encounter (Signed)
 Orders approved  All wound or drain care orders need to come from her surgical team.

## 2023-09-11 NOTE — Telephone Encounter (Signed)
 Pt sent MyChart msg. Msg forwarded to provider.

## 2023-09-11 NOTE — Telephone Encounter (Signed)
 Please call pt. Her urine culture is still pending.  Culture appears to growing a different bacteria, therefore I changed her abx to omnicef  BID. Start as soon as possible. Can dc bactrim 

## 2023-09-11 NOTE — Telephone Encounter (Signed)
 Pt aware and verbalized understanding.

## 2023-09-12 ENCOUNTER — Other Ambulatory Visit: Payer: Self-pay

## 2023-09-12 ENCOUNTER — Telehealth: Payer: Self-pay

## 2023-09-12 DIAGNOSIS — I119 Hypertensive heart disease without heart failure: Secondary | ICD-10-CM | POA: Diagnosis not present

## 2023-09-12 DIAGNOSIS — A491 Streptococcal infection, unspecified site: Secondary | ICD-10-CM | POA: Insufficient documentation

## 2023-09-12 DIAGNOSIS — T8143XD Infection following a procedure, organ and space surgical site, subsequent encounter: Secondary | ICD-10-CM | POA: Diagnosis not present

## 2023-09-12 DIAGNOSIS — N739 Female pelvic inflammatory disease, unspecified: Secondary | ICD-10-CM | POA: Diagnosis not present

## 2023-09-12 DIAGNOSIS — I2510 Atherosclerotic heart disease of native coronary artery without angina pectoris: Secondary | ICD-10-CM | POA: Diagnosis not present

## 2023-09-12 DIAGNOSIS — E872 Acidosis, unspecified: Secondary | ICD-10-CM | POA: Diagnosis not present

## 2023-09-12 DIAGNOSIS — T85528D Displacement of other gastrointestinal prosthetic devices, implants and grafts, subsequent encounter: Secondary | ICD-10-CM | POA: Diagnosis not present

## 2023-09-12 DIAGNOSIS — N136 Pyonephrosis: Secondary | ICD-10-CM | POA: Diagnosis not present

## 2023-09-12 DIAGNOSIS — D649 Anemia, unspecified: Secondary | ICD-10-CM | POA: Diagnosis not present

## 2023-09-12 DIAGNOSIS — K9189 Other postprocedural complications and disorders of digestive system: Secondary | ICD-10-CM | POA: Diagnosis not present

## 2023-09-12 LAB — URINALYSIS W MICROSCOPIC + REFLEX CULTURE
Bacteria, UA: NONE SEEN /HPF
Bilirubin Urine: NEGATIVE
Glucose, UA: NEGATIVE
Ketones, ur: NEGATIVE
Nitrites, Initial: NEGATIVE
Specific Gravity, Urine: 1.019 (ref 1.001–1.035)
pH: 7 (ref 5.0–8.0)

## 2023-09-12 LAB — URINE CULTURE
MICRO NUMBER:: 16768454
SPECIMEN QUALITY:: ADEQUATE

## 2023-09-12 LAB — CULTURE INDICATED

## 2023-09-12 MED ORDER — LINEZOLID 600 MG PO TABS: 600.0000 mg | ORAL_TABLET | Freq: Two times a day (BID) | ORAL | 0 refills | Status: AC

## 2023-09-12 NOTE — Telephone Encounter (Signed)
 Called patient and discussed urine culture sensitivities resulting in vancomycin-resistant Enterococcus infection, sensitive only to Zyvox . - Prescribed Zyvox  and encouraged her start immediately. - Placed urgent referral to her urology team and infectious disease concerning complication of vancomycin-resistant Enterococcus infection status post ureteral stent placement for large kidney stone, which was noted during her ED visit/hospital admission for sepsis secondary to abdominal abscess diverticulosis recently undergoing partial colectomy.  Patient is at high risk for complication. She was encouraged if any fevers, chills, worsening abdominal pain, difficulty urinating or low back pain, she is to be seen emergently in the ED.

## 2023-09-12 NOTE — Telephone Encounter (Signed)
 Orders approved.

## 2023-09-12 NOTE — Patient Instructions (Signed)
 Visit Information  Thank you for taking time to visit with me today. Please don't hesitate to contact me if I can be of assistance to you before our next scheduled telephone appointment.  Our next appointment is by telephone on 09/19/2023 at 1045  Following is a copy of your care plan:   Goals Addressed             This Visit's Progress    [facility] Transitions of Care (TOC) Care Plan       Problems:  Recent Hospitalization for treatment of bowel perforation 09/12/2023  Reports ostomy bag and dressing changes are doing well.  Home Health services barrier: confirmed that home health will start on 09/06/2023 09/12/2023  home health nurse coming today UTI- per husband 1 and a half plus gallons of urine in 24 hours. Patient unable to control urination. Wearing diapers and is incontinent. Last antibiotics tonight.  Positive urine culture. 09/12/2023 Saw PCP and still has a UTI. Continues to have urinary frequency. Antibiotics changed yesterday.  Husband concerns about electrolytes  09/12/2023 labs checked by PCP and look good.   Goal:  Over the next 30 days, the patient will not experience hospital readmission  Interventions:  Transitions of Care:  Doctor Visits  - discussed the importance of doctor visits Encouraged patient to drink a lot of fluid. Reviewed energy levels and need for rest Confirmed dressing changes and ostomy care is going well. Confirmed patient has her new antibiotics and is taking as prescribed Reviewed pending appointment Encouraged patient to eat well  Surgery (Perforated bowel, kidney stent): Reviewed that home health is coming today to assess surgical site. Encouraged patient to follow up with IR as planned later in the week.   Patient Self Care Activities:  Attend all scheduled provider appointments Call pharmacy for medication refills 3-7 days in advance of running out of medications Call provider office for new concerns or questions  Notify RN Care Manager of TOC  call rescheduling needs Participate in Transition of Care Program/Attend TOC scheduled calls Take medications as prescribed   Stay hydrated Husband to continue to do dressing changes. Call home health for any new concerns. Call MD or seek emergency care if needed.   Plan:  Telephone follow up appointment with care management team member scheduled for:  09/19/2023 The patient has been provided with contact information for the care management team and has been advised to call with any health related questions or concerns.         Patient verbalizes understanding of instructions and care plan provided today and agrees to view in MyChart. Active MyChart status and patient understanding of how to access instructions and care plan via MyChart confirmed with patient.     Telephone follow up appointment with care management team member scheduled for:   09/19/2023  Please call the care guide team at [phone] if you need to cancel or reschedule your appointment.   Please call the Suicide and Crisis Lifeline: 988 call the [place]  National Suicide Prevention Lifeline: 845-761-8639 or TTY: 778-800-9370 TTY (250)567-0184) to talk to a trained counselor call 1-800-273-TALK (toll free, 24 hour hotline) call 911 if you are experiencing a Mental Health or Behavioral Health Crisis or need someone to talk to.  Alan Ee, RN, BSN, CEN [employer]- Transition of Care Team.  [facility] [phone]

## 2023-09-12 NOTE — Telephone Encounter (Signed)
LVM for Angela [name]

## 2023-09-12 NOTE — Telephone Encounter (Signed)
 Communication  Caller/Agency: [facility]    Callback Number: [phone]    Service Requested: Physical Therapy    Frequency: 2xs a week for 4 weeks    Any new concerns about the patient? No   Okay for orders?

## 2023-09-12 NOTE — Transitions of Care (Post Inpatient/ED Visit) (Signed)
 Transition of Care week 2  Visit Note  09/12/2023  Name: Angela [name] MRN: [id number]          DOB: March 27, 1950  Situation: Patient enrolled in [facility] 30-day program. Visit completed with patient by telephone.   Background:   Initial Transition Care Management Follow-up Telephone Call    Past Medical History:  Diagnosis Date   Arthritis    Blood in stool    greater than 4 years since last incidence of blood in stool    Colon polyps    Glaucoma    Hydroureteronephrosis 07/2023   Hyperlipidemia    Hypertension    Inclusion cyst 2021   face   Perforation of sigmoid colon (HCC) 07/2023   Colitis   PNA (pneumonia) 2006   Sepsis (HCC) 07/2023   Strain of left wrist 04/16/2019    Assessment:  Patient reports that she is doing well except for the UTI.  Reviewed that she continues to have urinary s/s. Started new antibiotic yesterday.   Patient Reported Symptoms: Cognitive Cognitive Status: Able to follow simple commands, Alert and oriented to person, place, and time, Normal speech and language skills      Neurological Neurological Review of Symptoms: No symptoms reported    HEENT HEENT Symptoms Reported: No symptoms reported      Cardiovascular Cardiovascular Symptoms Reported: No symptoms reported    Respiratory Respiratory Symptoms Reported: No symptoms reported    Endocrine Endocrine Symptoms Reported: No symptoms reported    Gastrointestinal Gastrointestinal Symptoms Reported: No symptoms reported, Other Other Gastrointestinal Symptoms: patient reports that she still has drain and it is not draining much.  Reports husband is doing dresing changes and so is home health nurse. Patient reports incision is healing.  Reports no concerns with ostomy at this time but is planning to go to the ostomy clinic next week. Gastrointestinal Management Strategies: Colostomy Colostomy Stoma Size: unknown Gastrointestinal Comment: going to the ostomy clinic next week. Homehealth and  husband managing without difficulty per patient.    Genitourinary Genitourinary Symptoms Reported: Blood in urine, Frequency, Incontinence, Pain/burning with urination Other Genitourinary Symptoms: Continues to have urinary frequency ( every 10-15 minutes per patient) Additional Genitourinary Details: New UA  and culture. PCP changed antibiotics yesterday Genitourinary Management Strategies: Medication therapy, Adequate rest Genitourinary Comment: Encouraged patient to drink more water and take all antibiotics as prescribed.  Integumentary Additional Integumentary Details: Mid line incision which patient repotrs is healing.  Husband doing dressing changes, Home health nurse coming today Skin Management Strategies: Medical device Skin Self-Management Outcome: 4 (good)  Musculoskeletal Musculoskelatal Symptoms Reviewed: No symptoms reported        Psychosocial Psychosocial Symptoms Reported: No symptoms reported         There were no vitals filed for this visit.  Medications Reviewed Today     Reviewed by Rumalda Alan PENNER, RN (Registered Nurse) on 09/12/23 at [phone]  Med List Status: <None>   Medication Order Taking? Sig Documenting Provider Last Dose Status Informant  acetaminophen  (TYLENOL ) 500 MG tablet [id number] Yes Take 2 tablets (1,000 mg total) by mouth every 6 (six) hours as needed. Perri DELENA Meliton Mickey., MD  Active   albuterol  (VENTOLIN  HFA) 108 [phone] Base) MCG/ACT inhaler [id number] Yes Inhale 2 puffs into the lungs every 6 (six) hours as needed for wheezing or shortness of breath. Catherine [name], DO  Active Self, Pharmacy Records, Multiple Informants, Spouse/Significant Other  carvedilol  (COREG ) 6.25 MG tablet [id number] Yes Take 1 tablet (6.25 mg  total) by mouth 2 (two) times daily with a meal. Kuneff, Renee A, DO  Active   cefdinir  (OMNICEF ) 300 MG capsule [id number] Yes Take 1 capsule (300 mg total) by mouth 2 (two) times daily. Kuneff, Renee A, DO  Active   ferrous sulfate  325 (65  FE) MG tablet [id number] Yes Take 1 tablet (325 mg total) by mouth every other day. Follow your iron and anemia with your PCP outpatient Perri DELENA Meliton Mickey., MD  Active   latanoprost  (XALATAN ) 0.005 % ophthalmic solution [id number] Yes Place 1 drop into both eyes at bedtime. [provider]  Active Self, Pharmacy Records, Multiple Informants, Spouse/Significant Other  lisinopril  (ZESTRIL ) 10 MG tablet [id number] Yes Take 1 tablet (10 mg total) by mouth daily. Kuneff, Renee A, DO  Active   methocarbamol  (ROBAXIN ) 500 MG tablet [id number] Yes Take 1 tablet (500 mg total) by mouth 2 (two) times daily as needed for muscle spasms. Kuneff, Renee A, DO  Active   Multiple Vitamin (MULTIVITAMIN) tablet [id number] Yes Take 1 tablet by mouth daily. [provider]  Active Self, Pharmacy Records, Multiple Informants, Spouse/Significant Other  ondansetron  (ZOFRAN -ODT) 4 MG disintegrating tablet [id number] Yes Take 1 tablet (4 mg total) by mouth every 8 (eight) hours as needed for nausea or vomiting. Kuneff, Renee A, DO  Active   pantoprazole  (PROTONIX ) 40 MG tablet [id number] Yes Take 1 tablet (40 mg total) by mouth daily. Kuneff, Renee A, DO  Active   polyethylene glycol powder (GLYCOLAX /MIRALAX ) 17 GM/SCOOP powder [id number] Yes Take 17 g by mouth daily. Perri DELENA Meliton Mickey., MD  Active   Probiotic Product (DAILY PROBIOTIC PO) [id number] Yes Take 1 capsule by mouth daily at 12 noon. [provider]  Active Self, Pharmacy Records, Multiple Informants, Spouse/Significant Other  sodium chloride  flush (NS) 0.9 % SOLN [id number] Yes Flush drain with 5 cc daily for 14 days.  Record output. Perri DELENA Meliton Mickey., MD  Active   sulfamethoxazole -trimethoprim  (BACTRIM  DS) 800-160 MG tablet [id number] Yes Take 1 tablet by mouth 2 (two) times daily for 7 days. Your last doses of antibiotics for your urine infection will be on 7/29. Kuneff, Renee A, DO  Active   timolol  (TIMOPTIC ) 0.5 % ophthalmic solution  [id number] Yes Place 1 drop into both eyes 2 (two) times daily. [provider]  Active Self, Pharmacy Records, Multiple Informants, Spouse/Significant Other            Goals      Patient Stated     Eat healthier, drink more water & increase activity     Patient Stated     Stay active and healthy     Patient Stated     Stay healthy      Patient Stated     Continue current lifestyle     VBCI Transitions of Care (TOC) Care Plan     Problems:  Recent Hospitalization for treatment of bowel perforation 09/12/2023  Reports ostomy bag and dressing changes are doing well.  Home Health services barrier: confirmed that home health will start on 09/06/2023 09/12/2023  home health nurse coming today UTI- per husband 1 and a half plus gallons of urine in 24 hours. Patient unable to control urination. Wearing diapers and is incontinent. Last antibiotics tonight.  Positive urine culture. 09/12/2023 Saw PCP and still has a UTI. Continues to have urinary frequency. Antibiotics changed yesterday.  Husband concerns about electrolytes  09/12/2023 labs checked by PCP and look good.   Goal:  Over the  next 30 days, the patient will not experience hospital readmission  Interventions:  Transitions of Care:  Doctor Visits  - discussed the importance of doctor visits Encouraged patient to drink a lot of fluid. Reviewed energy levels and need for rest Confirmed dressing changes and ostomy care is going well. Confirmed patient has her new antibiotics and is taking as prescribed Reviewed pending appointment Encouraged patient to eat well  Surgery (Perforated bowel, kidney stent): Reviewed that home health is coming today to assess surgical site. Encouraged patient to follow up with IR as planned later in the week.   Patient Self Care Activities:  Attend all scheduled provider appointments Call pharmacy for medication refills 3-7 days in advance of running out of medications Call provider office  for new concerns or questions  Notify RN Care Manager of TOC call rescheduling needs Participate in Transition of Care Program/Attend TOC scheduled calls Take medications as prescribed   Stay hydrated Husband to continue to do dressing changes. Call home health for any new concerns. Call MD or seek emergency care if needed.   Plan:  Telephone follow up appointment with care management team member scheduled for:  09/19/2023 The patient has been provided with contact information for the care management team and has been advised to call with any health related questions or concerns.         Recommendation:   Continue Current Plan of Care  Follow Up Plan:   Telephone follow up appointment date/time:  09/19/2023  at 1045  Alan Ee, RN, BSN, [employer]- Transition of Care Team.  [facility] [employer] [phone]

## 2023-09-13 ENCOUNTER — Other Ambulatory Visit: Payer: Self-pay | Admitting: Urology

## 2023-09-13 DIAGNOSIS — T8143XD Infection following a procedure, organ and space surgical site, subsequent encounter: Secondary | ICD-10-CM | POA: Diagnosis not present

## 2023-09-13 DIAGNOSIS — E872 Acidosis, unspecified: Secondary | ICD-10-CM | POA: Diagnosis not present

## 2023-09-13 DIAGNOSIS — N136 Pyonephrosis: Secondary | ICD-10-CM | POA: Diagnosis not present

## 2023-09-13 DIAGNOSIS — K9189 Other postprocedural complications and disorders of digestive system: Secondary | ICD-10-CM | POA: Diagnosis not present

## 2023-09-13 DIAGNOSIS — D649 Anemia, unspecified: Secondary | ICD-10-CM | POA: Diagnosis not present

## 2023-09-13 DIAGNOSIS — N739 Female pelvic inflammatory disease, unspecified: Secondary | ICD-10-CM | POA: Diagnosis not present

## 2023-09-13 DIAGNOSIS — T85528D Displacement of other gastrointestinal prosthetic devices, implants and grafts, subsequent encounter: Secondary | ICD-10-CM | POA: Diagnosis not present

## 2023-09-13 DIAGNOSIS — I2510 Atherosclerotic heart disease of native coronary artery without angina pectoris: Secondary | ICD-10-CM | POA: Diagnosis not present

## 2023-09-13 DIAGNOSIS — I119 Hypertensive heart disease without heart failure: Secondary | ICD-10-CM | POA: Diagnosis not present

## 2023-09-13 NOTE — Telephone Encounter (Signed)
 Still unable to reach [place], LVM.

## 2023-09-14 DIAGNOSIS — N136 Pyonephrosis: Secondary | ICD-10-CM | POA: Diagnosis not present

## 2023-09-14 DIAGNOSIS — K9189 Other postprocedural complications and disorders of digestive system: Secondary | ICD-10-CM | POA: Diagnosis not present

## 2023-09-14 DIAGNOSIS — T8143XD Infection following a procedure, organ and space surgical site, subsequent encounter: Secondary | ICD-10-CM | POA: Diagnosis not present

## 2023-09-14 DIAGNOSIS — I119 Hypertensive heart disease without heart failure: Secondary | ICD-10-CM | POA: Diagnosis not present

## 2023-09-14 DIAGNOSIS — E872 Acidosis, unspecified: Secondary | ICD-10-CM | POA: Diagnosis not present

## 2023-09-14 DIAGNOSIS — I2510 Atherosclerotic heart disease of native coronary artery without angina pectoris: Secondary | ICD-10-CM | POA: Diagnosis not present

## 2023-09-14 DIAGNOSIS — N739 Female pelvic inflammatory disease, unspecified: Secondary | ICD-10-CM | POA: Diagnosis not present

## 2023-09-14 DIAGNOSIS — T85528D Displacement of other gastrointestinal prosthetic devices, implants and grafts, subsequent encounter: Secondary | ICD-10-CM | POA: Diagnosis not present

## 2023-09-14 DIAGNOSIS — D649 Anemia, unspecified: Secondary | ICD-10-CM | POA: Diagnosis not present

## 2023-09-15 ENCOUNTER — Other Ambulatory Visit: Payer: Self-pay | Admitting: Student

## 2023-09-15 ENCOUNTER — Ambulatory Visit
Admission: RE | Admit: 2023-09-15 | Discharge: 2023-09-15 | Disposition: A | Discharge status: Home / Self Care | Source: Ambulatory Visit | Attending: General Surgery | Admitting: General Surgery

## 2023-09-15 ENCOUNTER — Telehealth: Payer: Self-pay

## 2023-09-15 ENCOUNTER — Other Ambulatory Visit: Payer: Self-pay | Admitting: General Surgery

## 2023-09-15 ENCOUNTER — Ambulatory Visit
Admission: RE | Admit: 2023-09-15 | Discharge: 2023-09-15 | Disposition: A | Discharge status: Home / Self Care | Source: Ambulatory Visit | Attending: Student | Admitting: Student

## 2023-09-15 DIAGNOSIS — T85528D Displacement of other gastrointestinal prosthetic devices, implants and grafts, subsequent encounter: Secondary | ICD-10-CM | POA: Diagnosis not present

## 2023-09-15 DIAGNOSIS — K631 Perforation of intestine (nontraumatic): Secondary | ICD-10-CM

## 2023-09-15 DIAGNOSIS — N739 Female pelvic inflammatory disease, unspecified: Secondary | ICD-10-CM | POA: Diagnosis not present

## 2023-09-15 DIAGNOSIS — I119 Hypertensive heart disease without heart failure: Secondary | ICD-10-CM | POA: Diagnosis not present

## 2023-09-15 DIAGNOSIS — K7689 Other specified diseases of liver: Secondary | ICD-10-CM | POA: Diagnosis not present

## 2023-09-15 DIAGNOSIS — M1990 Unspecified osteoarthritis, unspecified site: Secondary | ICD-10-CM

## 2023-09-15 DIAGNOSIS — K55019 Acute (reversible) ischemia of small intestine, extent unspecified: Secondary | ICD-10-CM | POA: Diagnosis not present

## 2023-09-15 DIAGNOSIS — E785 Hyperlipidemia, unspecified: Secondary | ICD-10-CM

## 2023-09-15 DIAGNOSIS — K559 Vascular disorder of intestine, unspecified: Secondary | ICD-10-CM | POA: Diagnosis not present

## 2023-09-15 DIAGNOSIS — I700 Atherosclerosis of aorta: Secondary | ICD-10-CM

## 2023-09-15 DIAGNOSIS — E872 Acidosis, unspecified: Secondary | ICD-10-CM | POA: Diagnosis not present

## 2023-09-15 DIAGNOSIS — N1330 Unspecified hydronephrosis: Secondary | ICD-10-CM | POA: Diagnosis not present

## 2023-09-15 DIAGNOSIS — K632 Fistula of intestine: Secondary | ICD-10-CM | POA: Diagnosis not present

## 2023-09-15 DIAGNOSIS — D649 Anemia, unspecified: Secondary | ICD-10-CM | POA: Diagnosis not present

## 2023-09-15 DIAGNOSIS — T8143XD Infection following a procedure, organ and space surgical site, subsequent encounter: Secondary | ICD-10-CM | POA: Diagnosis not present

## 2023-09-15 DIAGNOSIS — I2510 Atherosclerotic heart disease of native coronary artery without angina pectoris: Secondary | ICD-10-CM | POA: Diagnosis not present

## 2023-09-15 DIAGNOSIS — N136 Pyonephrosis: Secondary | ICD-10-CM | POA: Diagnosis not present

## 2023-09-15 DIAGNOSIS — K9189 Other postprocedural complications and disorders of digestive system: Secondary | ICD-10-CM | POA: Diagnosis not present

## 2023-09-15 DIAGNOSIS — N281 Cyst of kidney, acquired: Secondary | ICD-10-CM | POA: Diagnosis not present

## 2023-09-15 HISTORY — PX: IR RADIOLOGIST EVAL & MGMT: IMG5224

## 2023-09-15 MED ORDER — IOPAMIDOL (ISOVUE-300) INJECTION 61%
100.0000 mL | Freq: Once | INTRAVENOUS | Status: AC | PRN
  Administered 2023-09-15: 100 mL via INTRAVENOUS

## 2023-09-15 NOTE — Telephone Encounter (Signed)
 Received fax from [facility] Certification and POC 09/06/23 to 10/25/23  Order # [id number]  [facility] inbox front office

## 2023-09-15 NOTE — Telephone Encounter (Signed)
Completed forms and returned to [place] work basket.

## 2023-09-15 NOTE — Telephone Encounter (Signed)
 Forms faxed

## 2023-09-15 NOTE — Progress Notes (Addendum)
 Chief Complaint: Patient was seen in consultation today for R TG drain follow up at the request of Eletha Boas / Polly Hacker   Referring Physician(s): Eletha Boas / Polly Hacker   Supervising Physician: Jennefer Rover  History of Present Illness: Angela [name] is a 73 y.o. female colitis with ischemia and perforated colon s/p ex lap and Hartmann's by Dr. Polly procedure on 6/22, found to have post op complex free fluid w/ air in the surgical bed concerning for superimposed infection vs bowel leak underwent drain placement with IR who presents for drain follow up.   Patient was admitted from 07/30/23 to 7/28 due to colitis with ischemia and perforated colon, underwent ex lap and Hartmann's by Dr. Polly procedure on 6/22. CT on 6/29 showed complex free fluid w/ air in the surgical bed concerning for superimposed infection vs bowel leak, she underwent R TG drain placement by Dr. Philip on 6/30.   CT on 7/5 showed additional intraabdominal fluid collection as well as retracted R TG drain, patient underwent R TG drain repositioning and RLQ drain placement on 7/7.   CT on 7/16 showed resolution of the fluid collection around the RLQ drain, injection on 7/18 confirmed no fistula,the RLQ drain was removed on 7/17. The R TG drain was not removed due to persistent output. Patient underwent R TG drain repositioning on 7/23, drain injection showed no fistula.   Patient presents to [facility] radiology outpatient drain clinic with her husband today for first outpatient drain follow up.   Reports no complaints today. Last follow up visit with general surgery was 7/28, she is scheduled to see her surgeon some time in September.   Patient is currently on antibiotic treatment, patient is  flushing the drain. Output has been 3 mL for past two days, 13 mL on 8/5.      Past Medical History:  Diagnosis Date   Arthritis    Blood in stool    greater than 4 years since last incidence of  blood in stool    Colon polyps    Glaucoma    Hydroureteronephrosis 07/2023   Hyperlipidemia    Hypertension    Inclusion cyst 2021   face   Perforation of sigmoid colon (HCC) 07/2023   Colitis   PNA (pneumonia) 2006   Sepsis (HCC) 07/2023   Strain of left wrist 04/16/2019    Past Surgical History:  Procedure Laterality Date   ABSCESS DRAINAGE  2021   face- inclusion cyst   BREAST BIOPSY Left 07/19/2018   US  guided breast bx with clip placement- benign   COLON RESECTION SIGMOID  07/30/2023   Procedure: COLECTOMY, SIGMOID, OPEN;  Surgeon: Polly Hacker LABOR, MD;  Location: [facility] OR;  Service: General;;   CYSTOSCOPY W/ URETERAL STENT PLACEMENT Left 08/18/2023   Procedure: CYSTOSCOPY, WITH RETROGRADE PYELOGRAM AND URETERAL STENT INSERTION;  Surgeon: Lovie Arlyss CROME, MD;  Location: [facility] OR;  Service: Urology;  Laterality: Left;   EYE SURGERY  1956   HEMORRHOID SURGERY  2001   IR CATHETER TUBE CHANGE  08/30/2023   IR CV LINE INJECTION  08/24/2023   IR RADIOLOGIST EVAL & MGMT  09/15/2023   LAPAROTOMY N/A 07/30/2023   Procedure: LAPAROTOMY, EXPLORATORY;  Surgeon: Polly Hacker LABOR, MD;  Location: [facility] OR;  Service: General;  Laterality: N/A;   RADIOACTIVE SEED GUIDED EXCISIONAL BREAST BIOPSY Left 03/15/2019   Procedure: RADIOACTIVE SEED GUIDED EXCISIONAL LEFT  BREAST BIOPSY;  Surgeon: Gladis Cough, MD;  Location: [facility];  Service: General;  Laterality: Left;   TONSILLECTOMY  1963   WISDOM TOOTH EXTRACTION      Allergies: Codeine  Medications: Prior to Admission medications   Medication Sig Start Date End Date Taking? Authorizing Provider  acetaminophen  (TYLENOL ) 500 MG tablet Take 2 tablets (1,000 mg total) by mouth every 6 (six) hours as needed. 09/04/23   Perri DELENA Meliton Mickey., MD  albuterol  (VENTOLIN  HFA) [phone] Base) MCG/ACT inhaler Inhale 2 puffs into the lungs every 6 (six) hours as needed for wheezing or shortness of breath. 03/03/22   Kuneff, Renee A, DO   carvedilol  (COREG ) 6.25 MG tablet Take 1 tablet (6.25 mg total) by mouth 2 (two) times daily with a meal. 09/06/23   Kuneff, Renee A, DO  ferrous sulfate  325 (65 FE) MG tablet Take 1 tablet (325 mg total) by mouth every other day. Follow your iron and anemia with your PCP outpatient 09/04/23 10/04/23  Perri DELENA Meliton Mickey., MD  latanoprost  (XALATAN ) 0.005 % ophthalmic solution Place 1 drop into both eyes at bedtime. 08/11/17   [provider]  linezolid  (ZYVOX ) 600 MG tablet Take 1 tablet (600 mg total) by mouth 2 (two) times daily for 10 days. 09/12/23 09/22/23  Kuneff, Renee A, DO  lisinopril  (ZESTRIL ) 10 MG tablet Take 1 tablet (10 mg total) by mouth daily. 09/06/23   Kuneff, Renee A, DO  methocarbamol  (ROBAXIN ) 500 MG tablet Take 1 tablet (500 mg total) by mouth 2 (two) times daily as needed for muscle spasms. 09/06/23   Kuneff, Renee A, DO  Multiple Vitamin (MULTIVITAMIN) tablet Take 1 tablet by mouth daily.    [provider]  ondansetron  (ZOFRAN -ODT) 4 MG disintegrating tablet Take 1 tablet (4 mg total) by mouth every 8 (eight) hours as needed for nausea or vomiting. 09/06/23   Kuneff, Renee A, DO  pantoprazole  (PROTONIX ) 40 MG tablet Take 1 tablet (40 mg total) by mouth daily. 09/06/23   Kuneff, Renee A, DO  polyethylene glycol powder (GLYCOLAX /MIRALAX ) 17 GM/SCOOP powder Take 17 g by mouth daily. 09/04/23   Perri DELENA Meliton Mickey., MD  Probiotic Product (DAILY PROBIOTIC PO) Take 1 capsule by mouth daily at 12 noon.    [provider]  sodium chloride  flush (NS) 0.9 % SOLN Flush drain with 5 cc daily for 14 days.  Record output. 09/04/23 09/18/23  Perri DELENA Meliton Mickey., MD  timolol  (TIMOPTIC ) 0.5 % ophthalmic solution Place 1 drop into both eyes 2 (two) times daily. 11/14/19   [provider]     Family History  Problem Relation Age of Onset   Heart attack Father    Heart attack Sister    Hyperlipidemia Sister    Hypertension Sister     Social History    Socioeconomic History   Marital status: Married    Spouse name: Not on file   Number of children: Not on file   Years of education: Not on file   Highest education level: Bachelor's degree (e.g., BA, AB, BS)  Occupational History   Not on file  Tobacco Use   Smoking status: Former    Current packs/day: 0.00    Types: Cigars, Cigarettes   Smokeless tobacco: Never   Tobacco comments:    *occasional smoker   Vaping Use   Vaping status: Former  Substance and Sexual Activity   Alcohol use: Yes    Alcohol/week: 4.0 standard drinks of alcohol    Types: 4 Glasses of wine per week  Comment: *rarely    Drug use: No   Sexual activity: Yes    Partners: Male    Comment: Married  Other Topics Concern   Not on file  Social History Narrative   Married. Retired. 2 children.   Bachelors degree.   Exercises routinely.   Drinks caffeine.   Smoke alarm in the home. Wears her seatbelt. Owns firearms.   Feels safe in her relationships.   Social Drivers of Health   Financial Resource Strain: Patient Declined (04/19/2023)   Overall Financial Resource Strain (CARDIA)    Difficulty of Paying Living Expenses: Patient declined  Food Insecurity: Unknown (09/05/2023)   Hunger Vital Sign    Worried About Running Out of Food in the Last Year: Never true    Ran Out of Food in the Last Year: Not on file  Transportation Needs: No Transportation Needs (09/05/2023)   PRAPARE - Administrator, Civil Service (Medical): No    Lack of Transportation (Non-Medical): No  Physical Activity: Sufficiently Active (04/19/2023)   Exercise Vital Sign    Days of Exercise per Week: 5 days    Minutes of Exercise per Session: 30 min  Stress: No Stress Concern Present (04/19/2023)   [employer] of Occupational Health - Occupational Stress Questionnaire    Feeling of Stress : Not at all  Social Connections: Patient Declined (08/02/2023)   Social Connection and Isolation Panel    Frequency of  Communication with Friends and Family: Patient declined    Frequency of Social Gatherings with Friends and Family: Patient declined    Attends Religious Services: Patient declined    Database administrator or Organizations: Patient declined    Attends Banker Meetings: Patient declined    Marital Status: Patient declined    ECOG Status: 1 - Symptomatic but completely ambulatory  Review of Systems: A 12 point ROS discussed and pertinent positives are indicated in the HPI above.  All other systems are negative.  Review of Systems  All other systems reviewed and are negative.   Vital Signs: There were no vitals taken for this visit.  Physical Exam Vitals reviewed.  Constitutional:      General: She is not in acute distress.    Appearance: She is not ill-appearing.  HENT:     Head: Normocephalic and atraumatic.  Pulmonary:     Effort: Pulmonary effort is normal.  Musculoskeletal:     Cervical back: Neck supple.  Skin:    General: Skin is warm and dry.     Coloration: Skin is not jaundiced or pale.     Comments: Positive R TG drain to a suction bulb. Site is unremarkable with no erythema, edema, tenderness, bleeding or drainage. Suture and stat lock in place. Dressing is clean, dry, and intact. ~5 ml of  complex serous fluid noted in the bulb.   Neurological:     Mental Status: She is alert and oriented to person, place, and time.  Psychiatric:        Mood and Affect: Mood normal.        Behavior: Behavior normal.     Mallampati Score:     Imaging: IR Radiologist Eval & Mgmt Result Date: 09/15/2023 CLINICAL DATA:  73 year old female the history ischemic colitis with perforation status post Hartmann's procedure 07/30/2023. She underwent multiple drain placements and exchanges. Presents for right TG drain eval. EXAM: FLUORO GUIDED RIGHT TRANS GLUTEAL DRAIN INJECTION. COMPARISON:  R TG drain injection performed 08/30/2023 CONTRAST:  8 mL Isovue -300-administered  via the existing percutaneous drain. FLUOROSCOPY TIME:  1.9 mGy TECHNIQUE: The patient was positioned prone on the fluoroscopy table. A preprocedural spot fluoroscopic image was obtained of the pelvic area and the existing percutaneous drainage catheter. Fluoroscopic and radiographic images were obtained following the injection of a small amount of contrast via the existing percutaneous drainage catheter. Contrast injection showed fistula to the rectum. Injection was terminated, approximately 2 mL of contrast was aspirated back. The drain was flushed with 5 mL of normal saline. New dressing was placed after the procedure. Small residual contrast seen in the urinary bladder. Patient received IV contrast for CT prior to the drain injection. 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 Performed by: Michaela Broski, PA-C Electronically Signed   By: Ester Sides M.D.   On: 09/15/2023 11:56   DG Sinus/Fist Tube Chk-Non GI Result Date: 09/15/2023 CLINICAL DATA:  73 year old female the history ischemic colitis with perforation status post Hartmann's procedure 07/30/2023. She underwent multiple drain placements and exchanges. Presents for right TG drain eval. EXAM: FLUORO GUIDED RIGHT TRANS GLUTEAL DRAIN INJECTION. COMPARISON:  R TG drain injection performed 08/30/2023 CONTRAST:  8 mL Isovue -300-administered via the existing percutaneous drain. FLUOROSCOPY TIME:  1.9 mGy TECHNIQUE: The patient was positioned prone on the fluoroscopy table. A preprocedural spot fluoroscopic image was obtained of the pelvic area and the existing percutaneous drainage catheter. Fluoroscopic and radiographic images were obtained following the injection of a small amount of contrast via the existing percutaneous drainage catheter. Contrast injection showed fistula to the rectum. Injection was terminated, approximately 2 mL of contrast was aspirated back. The drain was flushed with 5 mL of normal saline. New  dressing was placed after the procedure. Small residual contrast seen in the urinary bladder. Patient received IV contrast for CT prior to the drain injection. 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 Performed by: Abigail Teall, PA-C Electronically Signed   By: Ester Sides M.D.   On: 09/15/2023 11:56   IR Catheter Tube Change Result Date: 08/30/2023 INDICATION: Clinical concern for for cutaneous drainage catheter mild dysfunction/malposition EXAM: IR CATHETER TUBE CHANGE MEDICATIONS: The patient is currently admitted to the hospital and receiving intravenous antibiotics. The antibiotics were administered within an appropriate time frame prior to the initiation of the procedure. ANESTHESIA/SEDATION: Fentanyl  1 mcg IV; Versed  50 mg IV Moderate Sedation Time:  15 The patient was continuously monitored during the procedure by the interventional radiology nurse under my direct supervision. COMPLICATIONS: None immediate. PROCEDURE: Informed written consent was obtained from the patient after a thorough discussion of the procedural risks, benefits and alternatives. All questions were addressed. Maximal Sterile Barrier Technique was utilized including caps, mask, sterile gowns, sterile gloves, sterile drape, hand hygiene and skin antiseptic. A timeout was performed prior to the initiation of the procedure. The drain site was prepped and draped in usual sterile fashion. A gentle hand injection of contrast agent was performed which demonstrated filling of a residual cavity just adjacent to the percutaneous drainage catheter. A definite fistula was not seen. Next, the existing drainage catheter was removed over a guidewire. A new 10 French percutaneous drain was placed more cranially into the central portion of the residual collection. This was irrigated and an additional 20 cc of material were collected. The tube was then secured in place using graded suture material and placed to bulb  suction drainage. IMPRESSION: 1. Repositioning of the slightly displaced percutaneous drainage catheter within  the center of the residual fluid collection. 2. No definite fistula identified. 3. Continue percutaneous catheter drainage and routine drain management. Electronically Signed   By: Maude Naegeli M.D.   On: 08/30/2023 11:29   CT ABDOMEN PELVIS W CONTRAST Result Date: 08/29/2023 CLINICAL DATA:  Follow-up drainage. EXAM: CT ABDOMEN AND PELVIS WITH CONTRAST TECHNIQUE: Multidetector CT imaging of the abdomen and pelvis was performed using the standard protocol following bolus administration of intravenous contrast. RADIATION DOSE REDUCTION: This exam was performed according to the departmental dose-optimization program which includes automated exposure control, adjustment of the mA and/or kV according to patient size and/or use of iterative reconstruction technique. CONTRAST:  75mL OMNIPAQUE  IOHEXOL  350 MG/ML SOLN COMPARISON:  CT abdomen pelvis dated 08/23/2023. FINDINGS: Lower chest: Bibasal subpleural atelectasis. There is coronary vascular calcification. No intra-abdominal free air.  Trace subhepatic free fluid. Hepatobiliary: Small cyst in the left lobe of the liver. No biliary dilatation. The gallbladder is unremarkable. Pancreas: Unremarkable. No pancreatic ductal dilatation or surrounding inflammatory changes. Spleen: Normal in size without focal abnormality. Adrenals/Urinary Tract: The adrenal glands are unremarkable. Mild left hydronephrosis, progressed since the prior CT. Left ureteral stent with proximal tip in the interpolar collecting system and distal end in the urinary bladder. There is a 1 cm stone in the distal left ureter along the catheter is similar location as the prior CT. No hydronephrosis on the right. Small bilateral renal cysts or dilated calyx. The urinary bladder is grossly unremarkable. Stomach/Bowel: Postsurgical changes of the bowel with a left anterior colostomy. Mild dilatation  of several small bowel loops measure up to approximately 3 cm. A gradual transition in the anterior right pelvis noted. Findings favor to represent an ileus. A small-bowel obstruction secondary to peritoneal adhesions is considered less likely. Small-bowel series may provide better evaluation if clinically indicated. The appendix is unremarkable as visualized. Vascular/Lymphatic: Advanced aortoiliac atherosclerotic disease. The IVC is unremarkable. No portal venous gas. There is no adenopathy. Reproductive: The uterus is grossly unremarkable. No suspicious adnexal masses. Other: Right transgluteal pigtail drainage catheter with tip in the posterior pelvis. There is a 5.3 x 2.9 cm loculated collection just superior to the pigtail tip of the catheter in the posterior pelvis, relatively similar to prior CT. There has been interval removal of the right anterior percutaneous drainage catheter. Musculoskeletal: Degenerative changes of the spine. No acute osseous pathology. Midline vertical anterior pelvic wall open wound. IMPRESSION: 1. Right transgluteal pigtail drainage catheter with tip in the posterior pelvis. A 5.3 x 2.9 cm loculated collection just superior to the pigtail tip of the catheter in the posterior pelvis, relatively similar to prior CT. 2. Mild left hydronephrosis, progressed since the prior CT. Left ureteral stent in place. A 1 cm stone in the distal left ureter along the catheter is similar location as the prior CT. 3. Postsurgical changes of the bowel with a left anterior colostomy. Mild dilatation of several small bowel loops favor to represent an ileus. A small-bowel obstruction secondary to peritoneal adhesions is considered less likely. Small-bowel series may provide better evaluation if clinically indicated. 4.  Aortic Atherosclerosis (ICD10-I70.0). Electronically Signed   By: Vanetta Chou M.D.   On: 08/29/2023 14:33   DG Abd Portable 1V Result Date: 08/29/2023 CLINICAL DATA:  Small bowel  obstruction. EXAM: PORTABLE ABDOMEN - 1 VIEW COMPARISON:  08/28/2023 FINDINGS: NG tube tip is positioned in the proximal stomach. Side port of the NG tube is in the region of the GE junction in tube could be  advanced 3-4 cm to ensure side port placement below the GE junction as clinically warranted. Left internal ureteral stent device again noted. Percutaneous pigtail drain overlies the right abdomen and pelvis with formed loop overlying the central upper pelvis. Diffuse gaseous bowel distension noted with some small bowel loops measuring up to 3.6 cm diameter. IMPRESSION: NG tube tip is positioned in the proximal stomach. Side port of the NG tube is in the region of the GE junction and tube could be advanced 3-4 cm to ensure side port placement below the GE junction as clinically Diffuse gaseous bowel distension. Electronically Signed   By: Camellia Candle M.D.   On: 08/29/2023 07:02   DG Abd Portable 1V Result Date: 08/28/2023 CLINICAL DATA:  73 year old female status post exploratory laparotomy. Abdominal pain. Small-bowel obstruction. EXAM: PORTABLE ABDOMEN - 1 VIEW COMPARISON:  Abdomen radiographs yesterday and earlier. FINDINGS: Portable AP supine view at 0724 hours. Stable enteric tube, left double-J ureteral stent, right percutaneous pelvic drain. Non obstructed bowel gas pattern. Stable visualized osseous structures. Negative lung bases. IMPRESSION: 1. Non obstructed bowel gas pattern. 2.  Stable lines and tubes. Electronically Signed   By: VEAR Hurst M.D.   On: 08/28/2023 07:44   DG Abd Portable 1V Result Date: 08/27/2023 CLINICAL DATA:  73 year old female status post exploratory laparotomy. Abdominal pain. Small-bowel obstruction. EXAM: PORTABLE ABDOMEN - 1 VIEW COMPARISON:  Abdominal radiographs yesterday and earlier. FINDINGS: Portable AP supine view at 0636 hours. Stable lines and tubes in the abdomen and pelvis. Negative lung bases. Stable visualized osseous structures. Oral contrast previously in  the right colon has continued distally. Mild left abdominal small bowel gas use distension now, 3.7 cm. Bowel-gas pattern remains improved compared to the CT scout view on 08/23/2023. no definite pneumoperitoneum on these supine views. IMPRESSION: 1. Large bowel oral contrast has continued distally. Mildly dilated left abdominal small bowel loops, but gas pattern still improved compared to recent CT Abdomen and Pelvis. 2. Stable lines and tubes. Electronically Signed   By: VEAR Hurst M.D.   On: 08/27/2023 06:59   DG Abd Portable 1V Result Date: 08/26/2023 CLINICAL DATA:  73 year old female status post exploratory laparotomy. Abdominal pain. Small-bowel obstruction. EXAM: PORTABLE ABDOMEN - 1 VIEW COMPARISON:  Abdominal radiographs yesterday, postoperative CT Abdomen and Pelvis 08/23/2023. FINDINGS: Portable AP supine views at 0629 hours. Satisfactory enteric tube termination in the stomach now, side hole at the gastric cardia level. Stable left double-J ureteral stent. Stable right pelvic pigtail drain. Bowel-gas pattern appears improved compared to the scout view on 08/23/2023. Oral contrast in the right colon similar to the radiographs yesterday. Stable, negative lung bases. Stable visualized osseous structures. IMPRESSION: 1. Satisfactory enteric tube placement into the stomach. Otherwise stable lines and tubes. 2. Improving bowel gas pattern since the CT on 08/23/2023. Stable oral contrast in the right colon. Electronically Signed   By: VEAR Hurst M.D.   On: 08/26/2023 07:19   IR INJECT INDWELLING DRAINAGE CATHETER Result Date: 08/25/2023 CLINICAL DATA:  Abscess drain follow-up. 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 RIGHT lower quadrant and transgluteal abscess drain placement on 08/14/2023 and 08/15/2023. EXAM: IR INJECT/THERA/INC NEEDLE/CATH/PLC EPI/LUMB/SAC W/IMG COMPARISON:  CT AP, 08/23/2023. CONTRAST:   10 mL Isovue -300-administered via the existing percutaneous drain. FLUOROSCOPY: Radiation Exposure Index and estimated peak skin dose (PSD); Reference air kerma (RAK), 0.7 mGy. TECHNIQUE: The RIGHT lower quadrant drain up was removed prior to fluoroscopic evaluation. The  patient was positioned prone on the fluoroscopy table. A preprocedural spot fluoroscopic image was obtained of the pelvis and the existing percutaneous drainage catheter. Multiple spot fluoroscopic and radiographic images were obtained following the injection of a small amount of contrast via the existing RIGHT transgluteal approach percutaneous drainage catheter. Procedure assisted by Tawni Lecher, IR RT under direct supervision of Thom Hall, MD FINDINGS: No significant residual abscess collection or obvious fistulous communication. IMPRESSION: 1. No significant residual abscess collection or obvious fistulous communication. 2. The RIGHT lower quadrant drainage catheter was removed. 3. The RIGHT transgluteal approach drainage catheter was left in place, secondary to residual output volume. No significant abscess collection or obvious fistulous communication noted on fluoroscopic evaluation. Thom Hall, MD [facility] Electronically Signed   By: Thom Hall M.D.   On: 08/25/2023 09:36   DG Abd Portable 1V Result Date: 08/25/2023 EXAM: 1 VIEW XRAY OF THE ABDOMEN 08/25/2023 06:38:00 AM COMPARISON: 08/24/2023 CLINICAL HISTORY: [id number] SBO (small bowel obstruction) (HCC) [id number]. SBO. Pt complains of abdominal pain FINDINGS: BOWEL: Persistent dilated loops of small bowel noted in the left lower quadrant of the abdomen measuring up to 4.1 cm previously 4.0 cm. SOFT TISSUES: No abnormal calcifications or opaque urinary calculi. There is a right lower quadrant approach pigtail drainage catheter with distal end in the right paramidline pelvis. BONES: No acute osseous abnormality. LINES  AND TUBES: There is an enteric tube with tip approximately 6.2 cm below the GE junction. The side port is 1.5 cm above the GE junction. Recommend advancing enteric tube. Left-sided nephroureteral stent is again noted. IMPRESSION: 1. Persistent dilated loops of small bowel in the left lower quadrant, measuring up to 4.1 cm, previously 4.0 cm, consistent with small bowel obstruction. 2. Enteric tube with tip approximately 6.2 cm below the GE junction and side port 1.5 cm above the GE junction. Recommend advancing enteric tube. 3. Left-sided nephroureteral stent and right lower quadrant approach pigtail drainage catheter with distal end in the right paramidline pelvis. Electronically signed by: Waddell Calk MD 08/25/2023 07:19 AM EDT RP Workstation: [id number]   DG Abd Portable 1V Result Date: 08/24/2023 CLINICAL DATA:  [id number] Encounter for nasogastric (NG) tube placement [id number] EXAM: PORTABLE ABDOMEN - 1 VIEW COMPARISON:  None Available. FINDINGS: Esophagogastric tube terminates at the GE junction. Nonobstructive bowel gas pattern. No pneumoperitoneum. Partially visualized left ureteral stent. No acute fracture or destructive lesion. The lung bases are clear. IMPRESSION: Esophagogastric tube terminates in the region of the GE junction. The last side hole is in the distal esophagus and continued advancement is recommended. Electronically Signed   By: Rogelia Myers M.D.   On: 08/24/2023 11:48   DG Abd Portable 1V Result Date: 08/24/2023 CLINICAL DATA:  Evaluate small bowel obstruction. History of bowel perforation. EXAM: PORTABLE ABDOMEN - 1 VIEW COMPARISON:  08/23/2023 FINDINGS: There are 2 pigtail drainage catheters within the right paramidline pelvis. Left-sided nephroureteral stent is in place. Persistent dilated small bowel loops are identified within the left hemiabdomen which appears similar to the previous exam measuring up to 4 cm. IMPRESSION: 1. Persistent dilated small bowel loops within the left  hemiabdomen compatible with small bowel obstruction. Electronically Signed   By: Waddell Calk M.D.   On: 08/24/2023 06:55   CT ABDOMEN PELVIS W CONTRAST Result Date: 08/23/2023 CLINICAL DATA:  Postoperative abdominal pain. Exploratory laparotomy with Hartmann's procedure. EXAM: CT ABDOMEN AND PELVIS WITH CONTRAST TECHNIQUE: Multidetector CT imaging of the abdomen and pelvis was performed  using the standard protocol following bolus administration of intravenous contrast. RADIATION DOSE REDUCTION: This exam was performed according to the departmental dose-optimization program which includes automated exposure control, adjustment of the mA and/or kV according to patient size and/or use of iterative reconstruction technique. CONTRAST:  75mL OMNIPAQUE  IOHEXOL  350 MG/ML SOLN COMPARISON:  CT abdomen and pelvis 08/12/2023 FINDINGS: Lower chest: There is atelectasis in the lung bases. Hepatobiliary: There is a cyst in the left lobe of the liver which appears unchanged measuring 15 mm. No new liver lesions are seen. Gallbladder and bile ducts are within normal limits. Pancreas: Unremarkable. No pancreatic ductal dilatation or surrounding inflammatory changes. Spleen: Normal in size without focal abnormality. Adrenals/Urinary Tract: Left ureteral stent in place. There is no hydronephrosis in either kidney. There are rounded hypodensities in both kidneys which are too small to characterize, likely cysts. There is no perinephric fat stranding. Adrenal glands are within normal limits. There is a small amount of air in the bladder. Stomach/Bowel: There are dilated small bowel loops throughout the mid abdomen with air-fluid levels. The stomach is also dilated with large air-fluid level. Transition point is seen in the right lower quadrant adjacent to the anterior abdominal wall image 2/60. Distal small bowel and colon are nondilated. Left lower quadrant colostomy is present. The appendix is within normal limits. Air-fluid  levels are also scattered throughout the colon. Vascular/Lymphatic: Aorta and IVC are normal in size. There are atherosclerotic calcifications of the aorta. No enlarged lymph nodes are seen. Reproductive: Uterus and bilateral adnexa are unremarkable. Other: There is no ascites.  There is presacral edema. There is a percutaneous drainage catheter in the anterior right pelvis. There is no associated fluid collection at the end of this catheter. Posterior sacroiliac approach percutaneous catheter is seen ending in the cul-de-sac. There is no definite fluid collection at the tip of the catheter; however, there is enhancing fluid collection just superior to this catheter containing air in the presacral region measuring 5.8 by 2.3 x 1.2 cm. Enhancing fluid collection containing air in the lateral left and anterior anterior pelvis has significantly decreased in size. The largest portion of the collection is now seen on the left image 2/56 measuring 1.4 by 5.0 cm. No free intraperitoneal air. Musculoskeletal: Degenerative changes affect the spine. IMPRESSION: 1. Small-bowel obstruction with transition point in the right lower quadrant. 2. Left lower quadrant colostomy. 3. Percutaneous drainage catheter in the anterior right pelvis without associated fluid collection. 4. Percutaneous drainage catheter in the posterior sacroiliac region ending in the cul-de-sac. There is no definite fluid collection at the tip of the catheter; however, there is a enhancing fluid collection containing air in the presacral region measuring 5.8 x 2.3 x 1.2 cm. 5. Enhancing fluid collection containing air in the lateral left and anterior pelvis has significantly decreased in size. The largest portion of the collection is now seen on the left measuring 1.4 x 5.0 cm. 6. Left ureteral stent in place. No hydronephrosis. 7. Small amount of air in the bladder, possibly related to recent instrumentation. Aortic Atherosclerosis (ICD10-I70.0).  Electronically Signed   By: Greig Pique M.D.   On: 08/23/2023 17:57   DG Abd Portable 1V Result Date: 08/23/2023 CLINICAL DATA:  Nausea and vomiting EXAM: PORTABLE ABDOMEN - 1 VIEW COMPARISON:  X-ray performed August 12, 2023 FINDINGS: Diffuse gaseous distention of bowel is present with the most distended loops present in the left abdomen, similar. There is been interval placement of an internalized left-sided nephroureteral stent. Additionally,  right-sided drainage catheter material has been revised. Degenerative changes are present in the imaged osseous structures. IMPRESSION: 1. Diffuse gaseous distension of the bowel, similar when compared to the prior exam. Electronically Signed   By: Maude Naegeli M.D.   On: 08/23/2023 10:12   DG C-Arm 1-60 Min Result Date: 08/18/2023 CLINICAL DATA:  Cystoscopy and retrograde pyelogram with ureteral stent insertion EXAM: DG C-ARM 1-60 MIN FLUOROSCOPY: Fluoroscopy Time:  34.7 Radiation Exposure Index (if provided by the fluoroscopic device): 4.91 mGy Number of Acquired Spot Images: 1 COMPARISON:  CT abdomen pelvis 08/12/2023 FINDINGS: Contrast within the dilated ureter and collecting system. Multiple dilated loops of small bowel. IMPRESSION: Contrast within the dilated ureter collecting system during retrograde pyelogram. Dilated loops of small bowel similar to 08/12/2023. Electronically Signed   By: Norman Gatlin M.D.   On: 08/18/2023 17:11    Labs:  CBC: Recent Labs    09/01/23 0324 09/02/23 0350 09/04/23 0340 09/06/23 1139  WBC 6.1 5.3 5.9 6.3  HGB 10.3* 9.9* 9.8* 11.1*  HCT 31.9* 31.1* 31.3* 34.3*  PLT 328 305 312 381.0    COAGS: Recent Labs    07/29/23 0826 08/07/23 0909 08/15/23 0431  INR 1.1 1.2 1.1    BMP: Recent Labs    08/31/23 0358 09/01/23 0324 09/02/23 0350 09/04/23 0340 09/06/23 1139  NA 137 137 136 137 137  K 4.1 3.6 3.9 3.4* 4.0  CL 102 103 102 103 99  CO2 26 27 25 27 30   GLUCOSE 108* 102* 94 94 99  BUN 23 20 16 13  17   CALCIUM  8.4* 8.6* 8.4* 8.5* 8.7  CREATININE 0.50 0.55 0.55 0.49 0.56  GFRNONAA >60 >60 >60 >60  --     LIVER FUNCTION TESTS: Recent Labs    08/31/23 0358 09/01/23 0324 09/02/23 0350 09/06/23 1139  BILITOT 0.5 0.6 0.3 0.3  AST 15 14* 14* 19  ALT 11 12 11 21   ALKPHOS 63 70 59 81  PROT 5.2* 5.3* 5.1* 6.0  ALBUMIN  2.0* 2.0* 2.1* 3.4*    TUMOR MARKERS: No results for input(s): AFPTM, CEA, CA199, CHROMGRNA in the last 8760 hours.  Assessment: 73 year old female with hx of colitis with ischemia and perforated colon s/p ex lap and Hartmann's by Dr. Polly procedure on 6/22, underwent R TG and RLQ drain placement for post op intraabdominal fluid collections. RLQ drain was removed on 7/17, she presents to the clinic for R TG drain follow up.  Imaging obtained today reviewed by Dr. Jennefer who recommends change the collection divide to gravity bag, stop flushing and repeat drain injection in 2 weeks. No CT needed.  Patient and her husband were informed above recommendation.  Changed to gravity bag, new dressing was placed.    Electronically Signed: Seraphim Trow H Leighanna Kirn PA-C 09/15/2023, 12:00 PM   Please refer to Dr. Terrill attestation of this note for management and plan.

## 2023-09-18 ENCOUNTER — Ambulatory Visit (HOSPITAL_COMMUNITY)
Admission: RE | Admit: 2023-09-18 | Discharge: 2023-09-18 | Disposition: A | Discharge status: Home / Self Care | Source: Ambulatory Visit | Attending: Nurse Practitioner | Admitting: Nurse Practitioner

## 2023-09-18 DIAGNOSIS — K5289 Other specified noninfective gastroenteritis and colitis: Secondary | ICD-10-CM | POA: Diagnosis not present

## 2023-09-18 DIAGNOSIS — Z433 Encounter for attention to colostomy: Secondary | ICD-10-CM | POA: Diagnosis not present

## 2023-09-18 NOTE — Discharge Instructions (Signed)
 Will set up with [facility]   Skin prep Barrier ring  2 3/4 barrier

## 2023-09-18 NOTE — Patient Instructions (Signed)
 SURGICAL WAITING ROOM VISITATION Patients having surgery or a procedure may have no more than 2 support people in the waiting area - these visitors may rotate in the visitor waiting room.   Due to an increase in RSV and influenza rates and associated hospitalizations, children ages 69 and under may not visit patients in [facility] hospitals. If the patient needs to stay at the hospital during part of their recovery, the visitor guidelines for inpatient rooms apply.  PRE-OP VISITATION  Pre-op nurse will coordinate an appropriate time for 1 support person to accompany the patient in pre-op.  This support person may not rotate.  This visitor will be contacted when the time is appropriate for the visitor to come back in the pre-op area.  Please refer to the [facility] website for the visitor guidelines for Inpatients (after your surgery is over and you are in a regular room).  You are not required to quarantine at this time prior to your surgery. However, you must do this: Hand Hygiene often Do NOT share personal items Notify your provider if you are in close contact with someone who has COVID or you develop fever 100.4 or greater, new onset of sneezing, cough, sore throat, shortness of breath or body aches.  If you test positive for Covid or have been in contact with anyone that has tested positive in the last 10 days please notify you surgeon.    Your procedure is scheduled on: 09/26/23   Report to [facility] Main Entrance: [place] entrance where the [employer] is available.   Report to admitting at:5:15 AM  Call this number if you have any questions or problems the morning of surgery [phone]  FOLLOW ANY ADDITIONAL PRE OP INSTRUCTIONS YOU RECEIVED FROM YOUR SURGEON'S OFFICE!!!  Do not eat food or drink fluids after Midnight the night prior to your surgery/procedure.   Oral Hygiene is also important to reduce your risk of infection.        Remember - BRUSH YOUR TEETH  THE MORNING OF SURGERY WITH YOUR REGULAR TOOTHPASTE  Do NOT smoke after Midnight the night before surgery.  STOP TAKING all Vitamins, Herbs and supplements 1 week before your surgery.   Take ONLY these medicines the morning of surgery with A SIP OF WATER: carvedilol ,pantoprazole .Use inhalers as usual and bring them.Tylenol  as needed.Eye drops as usual.  If You have been diagnosed with Sleep Apnea - Bring CPAP mask and tubing day of surgery. We will provide you with a CPAP machine on the day of your surgery.                   You may not have any metal on your body including hair pins, jewelry, and body piercing  Do not wear make-up, lotions, powders, perfumes / cologne, or deodorant  Do not wear nail polish including gel and S&S, artificial / acrylic nails, or any other type of covering on natural nails including finger and toenails. If you have artificial nails, gel coating, etc., that needs to be removed by a nail salon, Please have this removed prior to surgery. Not doing so may mean that your surgery could be cancelled or delayed if the Surgeon or anesthesia staff feels like they are unable to monitor you safely.   Do not shave 48 hours prior to surgery to avoid nicks in your skin which may contribute to postoperative infections.   Contacts, Hearing Aids, dentures or bridgework may not be worn into surgery. DENTURES WILL  BE REMOVED PRIOR TO SURGERY PLEASE DO NOT APPLY Poly grip OR ADHESIVES!!!  You may bring a small overnight bag with you on the day of surgery, only pack items that are not valuable. [facility] IS NOT RESPONSIBLE   FOR VALUABLES THAT ARE LOST OR STOLEN.   Patients discharged on the day of surgery will not be allowed to drive home.  Someone NEEDS to stay with you for the first 24 hours after anesthesia.  Do not bring your home medications to the hospital. The Pharmacy will dispense medications listed on your medication list to you during your admission in the  Hospital.  Special Instructions: Bring a copy of your healthcare power of attorney and living will documents the day of surgery, if you wish to have them scanned into your [facility] Records- EPIC  Please read over the following fact sheets you were given: IF YOU HAVE QUESTIONS ABOUT YOUR PRE-OP INSTRUCTIONS, PLEASE CALL [phone]   [facility] - Preparing for Surgery Before surgery, you can play an important role.  Because skin is not sterile, your skin needs to be as free of germs as possible.  You can reduce the number of germs on your skin by washing with CHG (chlorahexidine gluconate) soap before surgery.  CHG is an antiseptic cleaner which kills germs and bonds with the skin to continue killing germs even after washing. Please DO NOT use if you have an allergy to CHG or antibacterial soaps.  If your skin becomes reddened/irritated stop using the CHG and inform your nurse when you arrive at Short Stay. Do not shave (including legs and underarms) for at least 48 hours prior to the first CHG shower.  You may shave your face/neck.  Please follow these instructions carefully:  1.  Shower with CHG Soap the night before surgery and the  morning of surgery.  2.  If you choose to wash your hair, wash your hair first as usual with your normal  shampoo.  3.  After you shampoo, rinse your hair and body thoroughly to remove the shampoo.                             4.  Use CHG as you would any other liquid soap.  You can apply chg directly to the skin and wash.  Gently with a scrungie or clean washcloth.  5.  Apply the CHG Soap to your body ONLY FROM THE NECK DOWN.   Do not use on face/ open                           Wound or open sores. Avoid contact with eyes, ears mouth and genitals (private parts).                       Wash face,  Genitals (private parts) with your normal soap.             6.  Wash thoroughly, paying special attention to the area where your  surgery  will be  performed.  7.  Thoroughly rinse your body with warm water from the neck down.  8.  DO NOT shower/wash with your normal soap after using and rinsing off the CHG Soap.            9.  Pat yourself dry with a clean towel.  10.  Wear clean pajamas.            11.  Place clean sheets on your bed the night of your first shower and do not  sleep with pets.  ON THE DAY OF SURGERY : Do not apply any lotions/deodorants the morning of surgery.  Please wear clean clothes to the hospital/surgery center.     FAILURE TO FOLLOW THESE INSTRUCTIONS MAY RESULT IN THE CANCELLATION OF YOUR SURGERY  PATIENT SIGNATURE_________________________________  NURSE SIGNATURE__________________________________  ________________________________________________________________________

## 2023-09-18 NOTE — Progress Notes (Signed)
 [facility]   Reason for visit:  LLQ colostomy.  Son cares for stoma at home.  HPI:  Stercoral colitis with end colostomy Past Medical History:  Diagnosis Date   Arthritis    Blood in stool    greater than 4 years since last incidence of blood in stool    Colon polyps    Glaucoma    Hydroureteronephrosis 07/2023   Hyperlipidemia    Hypertension    Inclusion cyst 2021   face   Perforation of sigmoid colon (HCC) 07/2023   Colitis   PNA (pneumonia) 2006   Sepsis (HCC) 07/2023   Strain of left wrist 04/16/2019   Family History  Problem Relation Age of Onset   Heart attack Father    Heart attack Sister    Hyperlipidemia Sister    Hypertension Sister    Allergies  Allergen Reactions   Codeine Other (See Comments)    Irritable , insomnia    Current Outpatient Medications  Medication Sig Dispense Refill Last Dose/Taking   acetaminophen  (TYLENOL ) 500 MG tablet Take 2 tablets (1,000 mg total) by mouth every 6 (six) hours as needed.      albuterol  (VENTOLIN  HFA) 108 (90 Base) MCG/ACT inhaler Inhale 2 puffs into the lungs every 6 (six) hours as needed for wheezing or shortness of breath. 8 g 0    carvedilol  (COREG ) 6.25 MG tablet Take 1 tablet (6.25 mg total) by mouth 2 (two) times daily with a meal. 60 tablet 5    ferrous sulfate  325 (65 FE) MG tablet Take 1 tablet (325 mg total) by mouth every other day. Follow your iron and anemia with your PCP outpatient 15 tablet 0    latanoprost  (XALATAN ) 0.005 % ophthalmic solution Place 1 drop into both eyes at bedtime.  4    linezolid  (ZYVOX ) 600 MG tablet Take 1 tablet (600 mg total) by mouth 2 (two) times daily for 10 days. 20 tablet 0    lisinopril  (ZESTRIL ) 10 MG tablet Take 1 tablet (10 mg total) by mouth daily. 90 tablet 1    methocarbamol  (ROBAXIN ) 500 MG tablet Take 1 tablet (500 mg total) by mouth 2 (two) times daily as needed for muscle spasms. 60 tablet 1    Multiple Vitamin (MULTIVITAMIN) tablet Take 1 tablet by  mouth daily.      ondansetron  (ZOFRAN -ODT) 4 MG disintegrating tablet Take 1 tablet (4 mg total) by mouth every 8 (eight) hours as needed for nausea or vomiting. 20 tablet 0    pantoprazole  (PROTONIX ) 40 MG tablet Take 1 tablet (40 mg total) by mouth daily. 90 tablet 1    polyethylene glycol powder (GLYCOLAX /MIRALAX ) 17 GM/SCOOP powder Take 17 g by mouth daily. 238 g 0    Probiotic Product (DAILY PROBIOTIC PO) Take 1 capsule by mouth daily at 12 noon.      timolol  (TIMOPTIC ) 0.5 % ophthalmic solution Place 1 drop into both eyes daily.      No current facility-administered medications for this encounter.   ROS  Review of Systems  Respiratory: Negative.    Cardiovascular:  Positive for leg swelling.  Gastrointestinal:        LLQ colostomy, recent pelvic abscesses  Skin:  Positive for color change.  Psychiatric/Behavioral: Negative.    All other systems reviewed and are negative.  Vital signs:  BP 108/71 (BP Location: Right Arm)   Pulse 73   Temp 97.9 F (36.6 C) (Oral)   Resp 18   SpO2 98%  Exam:  Physical Exam Vitals reviewed.  Constitutional:      Appearance: Normal appearance.  Cardiovascular:     Rate and Rhythm: Normal rate.     Pulses: Normal pulses.  Pulmonary:     Effort: Pulmonary effort is normal.  Abdominal:     Palpations: Abdomen is soft.  Musculoskeletal:        General: Normal range of motion.  Skin:    General: Skin is warm and dry.     Findings: Erythema present.  Neurological:     Mental Status: She is alert and oriented to person, place, and time.     Stoma type/location:  LLQ colostomy Stomal assessment/size:  1 1/2 budded pink and moist Peristomal assessment:  intact, pink Treatment options for stomal/peristomal skin: barrier ring and 2 piece pouch . Skin prep to peristomal skin .  Output: soft brown stool Ostomy pouching: 2pc. 2 3/4 pouch   Education provided:  has been set up with [facility].  Will send Rx.     Impression/dx   colostomy Discussion  Performed pouch change, son attentive and asked appropriate questions Plan  Set up for supplies.  Has been enrolled with [facility], but readmissions have caused shipments to be delayed.  Will send orders.     Visit time: 55 minutes.   Darice Cooley FNP-BC

## 2023-09-19 ENCOUNTER — Other Ambulatory Visit: Payer: Self-pay

## 2023-09-19 ENCOUNTER — Other Ambulatory Visit: Payer: Self-pay | Admitting: General Surgery

## 2023-09-19 DIAGNOSIS — T8143XD Infection following a procedure, organ and space surgical site, subsequent encounter: Secondary | ICD-10-CM | POA: Diagnosis not present

## 2023-09-19 DIAGNOSIS — K9189 Other postprocedural complications and disorders of digestive system: Secondary | ICD-10-CM | POA: Diagnosis not present

## 2023-09-19 DIAGNOSIS — N136 Pyonephrosis: Secondary | ICD-10-CM | POA: Diagnosis not present

## 2023-09-19 DIAGNOSIS — I2510 Atherosclerotic heart disease of native coronary artery without angina pectoris: Secondary | ICD-10-CM | POA: Diagnosis not present

## 2023-09-19 DIAGNOSIS — K631 Perforation of intestine (nontraumatic): Secondary | ICD-10-CM

## 2023-09-19 DIAGNOSIS — I119 Hypertensive heart disease without heart failure: Secondary | ICD-10-CM | POA: Diagnosis not present

## 2023-09-19 DIAGNOSIS — D649 Anemia, unspecified: Secondary | ICD-10-CM | POA: Diagnosis not present

## 2023-09-19 DIAGNOSIS — E872 Acidosis, unspecified: Secondary | ICD-10-CM | POA: Diagnosis not present

## 2023-09-19 DIAGNOSIS — N739 Female pelvic inflammatory disease, unspecified: Secondary | ICD-10-CM | POA: Diagnosis not present

## 2023-09-19 DIAGNOSIS — T85528D Displacement of other gastrointestinal prosthetic devices, implants and grafts, subsequent encounter: Secondary | ICD-10-CM | POA: Diagnosis not present

## 2023-09-19 NOTE — Transitions of Care (Post Inpatient/ED Visit) (Signed)
 Transition of Care week 3  Visit Note  09/19/2023  Name: Angela [name] MRN: [id number]          DOB: 19-Mar-1950  Situation: Patient enrolled in [facility] 30-day program. Visit completed with pt by telephone.   Background:   Initial Transition Care Management Follow-up Telephone Call    Past Medical History:  Diagnosis Date   Arthritis    Blood in stool    greater than 4 years since last incidence of blood in stool    Colon polyps    Glaucoma    Hydroureteronephrosis 07/2023   Hyperlipidemia    Hypertension    Inclusion cyst 2021   face   Perforation of sigmoid colon (HCC) 07/2023   Colitis   PNA (pneumonia) 2006   Sepsis (HCC) 07/2023   Strain of left wrist 04/16/2019    Assessment: Pain 7/10. Continues to have severe dysuria and frequency. Poor energy levels reported today.  No fever. Current on second course of antibiotics and no change in dysuria or frequency. Patient Reported Symptoms: Cognitive Cognitive Status: Able to follow simple commands, Alert and oriented to person, place, and time, Normal speech and language skills      Neurological Neurological Review of Symptoms: No symptoms reported    HEENT HEENT Symptoms Reported: No symptoms reported      Cardiovascular Cardiovascular Symptoms Reported: No symptoms reported    Respiratory Respiratory Symptoms Reported: No symptoms reported    Endocrine Endocrine Symptoms Reported: No symptoms reported Is patient diabetic?: No    Gastrointestinal Gastrointestinal Symptoms Reported: No symptoms reported Other Gastrointestinal Symptoms: wound is healing per home health nurse.  ostomy- showing pasty brown.      Genitourinary Genitourinary Symptoms Reported: Frequency, Difficulty initiating stream, Urgency, Pain/burning with urination    Integumentary Integumentary Symptoms Reported: Incision Additional Integumentary Details: mid abdominal wound healing- 12.3 inchest today, 13.3 last week. drain to buttocks pulling  sensation with very little drainage. Painful.  Seen at the ostomy clinic and ostomy doing well. Bag staying on well. Skin Management Strategies: Medical device  Musculoskeletal Musculoskelatal Symptoms Reviewed: No symptoms reported        Psychosocial Psychosocial Symptoms Reported: No symptoms reported         Today's Vitals   09/19/23 1103  BP: 108/69  PainSc: 7      Medications Reviewed Today     Reviewed by Rumalda Alan PENNER, RN (Registered Nurse) on 09/19/23 at 1052  Med List Status: <None>   Medication Order Taking? Sig Documenting Provider Last Dose Status Informant  acetaminophen  (TYLENOL ) 500 MG tablet [id number] Yes Take 2 tablets (1,000 mg total) by mouth every 6 (six) hours as needed. Perri DELENA Meliton Mickey., MD  Active Self  albuterol  (VENTOLIN  HFA) 108 [phone] Base) MCG/ACT inhaler [id number] Yes Inhale 2 puffs into the lungs every 6 (six) hours as needed for wheezing or shortness of breath. Kuneff, Renee A, DO  Active Self  carvedilol  (COREG ) 6.25 MG tablet [id number] Yes Take 1 tablet (6.25 mg total) by mouth 2 (two) times daily with a meal. Kuneff, Renee A, DO  Active Self  ferrous sulfate  325 (65 FE) MG tablet [id number] Yes Take 1 tablet (325 mg total) by mouth every other day. Follow your iron and anemia with your PCP outpatient Perri DELENA Meliton Mickey., MD  Active Self  latanoprost  (XALATAN ) 0.005 % ophthalmic solution [id number] Yes Place 1 drop into both eyes at bedtime. [provider]  Active Self  linezolid  (ZYVOX ) 600 MG tablet  [id number] Yes Take 1 tablet (600 mg total) by mouth 2 (two) times daily for 10 days. Kuneff, Renee A, DO  Active Self  lisinopril  (ZESTRIL ) 10 MG tablet [id number] Yes Take 1 tablet (10 mg total) by mouth daily. Kuneff, Renee A, DO  Active Self  methocarbamol  (ROBAXIN ) 500 MG tablet [id number] Yes Take 1 tablet (500 mg total) by mouth 2 (two) times daily as needed for muscle spasms. Catherine, Renee A, DO  Active Self  Multiple Vitamin  (MULTIVITAMIN) tablet [id number] Yes Take 1 tablet by mouth daily. [provider]  Active Self  ondansetron  (ZOFRAN -ODT) 4 MG disintegrating tablet [id number] Yes Take 1 tablet (4 mg total) by mouth every 8 (eight) hours as needed for nausea or vomiting. Kuneff, Renee A, DO  Active Self  pantoprazole  (PROTONIX ) 40 MG tablet [id number] Yes Take 1 tablet (40 mg total) by mouth daily. Kuneff, Renee A, DO  Active Self  polyethylene glycol powder (GLYCOLAX /MIRALAX ) 17 GM/SCOOP powder [id number] Yes Take 17 g by mouth daily. Perri DELENA Meliton Mickey., MD  Active Self  Probiotic Product (DAILY PROBIOTIC PO) [id number] Yes Take 1 capsule by mouth daily at 12 noon. [provider]  Active Self  timolol  (TIMOPTIC ) 0.5 % ophthalmic solution [id number] Yes Place 1 drop into both eyes daily. [provider]  Active Self            Goals      Patient Stated     Eat healthier, drink more water & increase activity     Patient Stated     Stay active and healthy     Patient Stated     Stay healthy      Patient Stated     Continue current lifestyle     [facility] Transitions of Care (TOC) Care Plan     Problems:  Recent Hospitalization for treatment of bowel perforation 09/12/2023  Reports ostomy bag and dressing changes are doing well. 09/19/2023  Seen at the ostomy clinic and reports ostomy care is going well. Patient is having pasty brown stool. States it was suggested that she increase her miralax . Patient was seen by IR and drain was place to bag by gravity.  Patient reports the bag is not working well.  ( Reports the bag is huge-659ml).  Patient reports the bag is pulling and causing a lot of pain. Patient reports very little drain. States that drainage now looks like it has sediment in it.  Patient is going to call IT today about the pain and need to do something different. Patient scheduled to see IR on 8/13/205 at 745 am to evaluation of drain and site.  Home Health services barrier:  confirmed that home health will start on 09/06/2023 09/12/2023  home health nurse coming today. 09/19/2023  Home health nurse came today, nurse reports mid abdominal incision site is healing well but slow.  Patient has increased her protein intake.  UTI- per husband 1 and a half plus gallons of urine in 24 hours. Patient unable to control urination. Wearing diapers and is incontinent. Last antibiotics tonight.  Positive urine culture. 09/12/2023 Saw PCP and still has a UTI. Continues to have urinary frequency. Antibiotics changed yesterday. 09/19/2023 Patient reports recent change of antibiotic. States she has 3 days left.  States she continues to have frequency ( 6 times per hour) and dysuria.  Dysuria pain 7/10.  Patient is pending pre op tomorrow for kidney stone extraction on 09/26/2023. Denies fever but reports poor energy levels. Message back  from PCP discuss with urology urgent and patient needs to see ID. Scheduled with ID on 10/03/2023- ( needs a sooner appointment)   Goal:  Over the next 30 days, the patient will not experience hospital readmission  Interventions:  Transitions of Care:  Doctor Visits  - discussed the importance of doctor visits Encouraged patient to drink a lot of fluid. Reviewed energy levels and need for rest Confirmed dressing changes and ostomy care is going well. Confirmed patient has her new antibiotics and is taking as prescribed Reviewed pending appointments- pre op tomorrow Encouraged patient to eat well with increased protein Reviewed output of ostomy.  In basket message sent to PCP and urology to inform of current symptoms and concerns.  Patient informed of message from PCP- no response from urology.  Surgery (Perforated bowel, kidney stent): Reviewed that home health is coming today to assess surgical site. Reviewed pain and pull sensation to Bag that was placed on drain.  Patient reports the bad is not working for her. Very little drainage and states that  drainage has changed and now has sediment. Patient concern for infection.  IR notified and will evaluation patient on 09/20/2023 at 745am   Patient Self Care Activities:  Attend all scheduled provider appointments Call pharmacy for medication refills 3-7 days in advance of running out of medications Call provider office for new concerns or questions  Notify RN Care Manager of TOC call rescheduling needs Participate in Transition of Care Program/Attend TOC scheduled calls Take medications as prescribed   Stay hydrated Husband to continue to do dressing changes. Call home health for any new concerns. Call MD or seek emergency care if needed. Go to IR appointment Go to preop appointment as planned unless you hear otherwise from a medical professional   Plan:  Telephone follow up appointment with care management team member scheduled for:  09/25/2023 The patient has been provided with contact information for the care management team and has been advised to call with any health related questions or concerns.          Recommendation:   Continue Current Plan of Care  Follow Up Plan:   Will call patient back with update when I have new information from urology or ID  Alan Ee, RN, BSN, CEN Population Health- Transition of Care Team.  [facility] [phone]

## 2023-09-20 ENCOUNTER — Ambulatory Visit
Admission: RE | Admit: 2023-09-20 | Discharge: 2023-09-20 | Disposition: A | Discharge status: Home / Self Care | Source: Ambulatory Visit | Attending: General Surgery | Admitting: General Surgery

## 2023-09-20 ENCOUNTER — Encounter (HOSPITAL_COMMUNITY)
Admission: RE | Admit: 2023-09-20 | Discharge: 2023-09-20 | Disposition: A | Discharge status: Home / Self Care | Source: Ambulatory Visit | Attending: Urology | Admitting: Urology

## 2023-09-20 ENCOUNTER — Telehealth: Payer: Self-pay

## 2023-09-20 ENCOUNTER — Other Ambulatory Visit (HOSPITAL_COMMUNITY): Payer: Self-pay | Admitting: Nurse Practitioner

## 2023-09-20 ENCOUNTER — Other Ambulatory Visit: Payer: Self-pay

## 2023-09-20 DIAGNOSIS — K631 Perforation of intestine (nontraumatic): Secondary | ICD-10-CM

## 2023-09-20 DIAGNOSIS — D649 Anemia, unspecified: Secondary | ICD-10-CM | POA: Diagnosis not present

## 2023-09-20 DIAGNOSIS — Z01818 Encounter for other preprocedural examination: Secondary | ICD-10-CM | POA: Diagnosis not present

## 2023-09-20 DIAGNOSIS — E872 Acidosis, unspecified: Secondary | ICD-10-CM | POA: Diagnosis not present

## 2023-09-20 DIAGNOSIS — I2510 Atherosclerotic heart disease of native coronary artery without angina pectoris: Secondary | ICD-10-CM | POA: Diagnosis not present

## 2023-09-20 DIAGNOSIS — T85528D Displacement of other gastrointestinal prosthetic devices, implants and grafts, subsequent encounter: Secondary | ICD-10-CM | POA: Diagnosis not present

## 2023-09-20 DIAGNOSIS — L24B3 Irritant contact dermatitis related to fecal or urinary stoma or fistula: Secondary | ICD-10-CM

## 2023-09-20 DIAGNOSIS — Z433 Encounter for attention to colostomy: Secondary | ICD-10-CM | POA: Insufficient documentation

## 2023-09-20 DIAGNOSIS — K9189 Other postprocedural complications and disorders of digestive system: Secondary | ICD-10-CM | POA: Diagnosis not present

## 2023-09-20 DIAGNOSIS — T8143XD Infection following a procedure, organ and space surgical site, subsequent encounter: Secondary | ICD-10-CM | POA: Diagnosis not present

## 2023-09-20 DIAGNOSIS — N130 Hydronephrosis with ureteropelvic junction obstruction: Secondary | ICD-10-CM | POA: Diagnosis not present

## 2023-09-20 DIAGNOSIS — I119 Hypertensive heart disease without heart failure: Secondary | ICD-10-CM | POA: Diagnosis not present

## 2023-09-20 DIAGNOSIS — N739 Female pelvic inflammatory disease, unspecified: Secondary | ICD-10-CM | POA: Diagnosis not present

## 2023-09-20 DIAGNOSIS — N136 Pyonephrosis: Secondary | ICD-10-CM | POA: Diagnosis not present

## 2023-09-20 HISTORY — PX: IR RADIOLOGIST EVAL & MGMT: IMG5224

## 2023-09-20 MED ORDER — IOPAMIDOL (ISOVUE-300) INJECTION 61%
30.0000 mL | Freq: Once | INTRAVENOUS | Status: AC | PRN
  Administered 2023-09-20 (×2): 10 mL

## 2023-09-20 NOTE — Transitions of Care (Post Inpatient/ED Visit) (Signed)
 09/20/2023  Patient ID: Angela [name], female   DOB: 1950-07-28, 73 y.o.   MRN: [id number]  Placed call to urology. Spoke with [name] ( nurse for Dr. Lovie)  informed of patients symptoms and culture results from latest urine culture.  Reviewed patient is pending surgery on 09/26/2023.  Also informed of sensitivities and patient will finish course of new antibiotics in 2 days.   I requested a call back. Provided my contact information.  Alan Ee, RN, BSN, CEN [employer]- Transition of Care Team.  [facility] [phone]

## 2023-09-20 NOTE — Telephone Encounter (Signed)
 Received call from Izetta NETT nurse, wanting to see if patient could be seen by ID provider sooner due to VRE UTI with persistent dysuria and inability to initiate stream.   No sooner appointments available. PCP started patient on linezolid , but is unable to further manage. Provided Katie with pager number for on-call ID provider.   Tameyah Koch, BSN, RN

## 2023-09-20 NOTE — Transitions of Care (Post Inpatient/ED Visit) (Addendum)
 09/20/2023  Patient ID: Angela [name], female   DOB: Jun 13, 1950, 73 y.o.   MRN: [id number]  Reviewed IR message from today's visit.  Spoke with patient who did not understand that drain was draining abscess.  Radiologist reviewed this with the patient and the importance of keeping drain in at this time.  Patient reports she now understands and does not remember anyone telling her about the abscess.   Message received from urology: Angela [name] - our office is rechecking a urine sample today to be safe. Unfortunately these symptoms are not uncommon with a stent in place, but wanted to make sure her UTI cleared with surgery upcoming. If her urine shows no infection, there are a few medicines our office can prescribe that should make the stent discomfort a little better and hopefully get her through her surgery   Thanks  Herlene Foot   1406: Received call from urology, [name].  States that urine culture should be back Friday afternoon and patient can call to check Friday afternoon. Call the office number and press option 6.  [name] suggested patient get AZO or any urinary pain relief over the counter and take 3 times per day. 1445:  patient informed.  Call to ID: Izetta Pouch, Director of Clinical Program Development/ [facility] program called ID for this RN Care manager. Attempting to assist in coordinating care and appointments. Spoke with Infectious Disease Nurse line. Unable to move Infectious Disease appointment sooner than 8.26.25. Recommends speaking to on call ID physician: Dr. Dea. Pager number: [phone]. Per Izetta Pouch: Dr. Dea would not see this patient prior to 8.26.25 as an acute appointment. No other appointments available at this time. Izetta Pouch reviewed recent labs and history and physical with physician including ongoing urinary incontinence, dysuria and hesitancy. Reviewed antibiotic use and recent labs/diagnostics. Dr. Dea provided MRN for review but  recommended no changes at this time and also not extending linezolid  timeframe. Recommend continued follow-up with urology. This RN made aware of updates on care coordination efforts via TEAMS.  This message forwarded to Dr. Foot for his knowledge about recommendation from ID.   In basket message sent to PCP- Charlies Bellini to inform her of the above.  Patient also informed of Infectious disease appointment not being able to be moved up.   Attached care plan:  Goals Addressed             This Visit's Progress    [facility] Transitions of Care (TOC) Care Plan       Problems:  Recent Hospitalization for treatment of bowel perforation 09/12/2023  Reports ostomy bag and dressing changes are doing well. 09/19/2023  Seen at the ostomy clinic and reports ostomy care is going well. Patient is having pasty brown stool. States it was suggested that she increase her miralax . Patient was seen by IR and drain was place to bag by gravity.  Patient reports the bag is not working well.  ( Reports the bag is huge-664ml).  Patient reports the bag is pulling and causing a lot of pain. Patient reports very little drain. States that drainage now looks like it has sediment in it.  Patient is going to call IT today about the pain and need to do something different. Patient scheduled to see IR on 8/13/205 at 745 am to evaluation of drain and site.  Home Health services barrier: confirmed that home health will start on 09/06/2023 09/12/2023  home health nurse coming today. 09/19/2023  Home health nurse came today,  nurse reports mid abdominal incision site is healing well but slow.  Patient has increased her protein intake.  UTI- per husband 1 and a half plus gallons of urine in 24 hours. Patient unable to control urination. Wearing diapers and is incontinent. Last antibiotics tonight.  Positive urine culture. 09/12/2023 Saw PCP and still has a UTI. Continues to have urinary frequency. Antibiotics changed yesterday. 09/19/2023 Patient  reports recent change of antibiotic. States she has 3 days left.  States she continues to have frequency ( 6 times per hour) and dysuria.  Dysuria pain 7/10.  Patient is pending pre op tomorrow for kidney stone extraction on 09/26/2023. Denies fever but reports poor energy levels. Message back from PCP discuss with urology urgent and patient needs to see ID. Scheduled with ID on 10/03/2023- ( needs a sooner appointment)   Goal:  Over the next 30 days, the patient will not experience hospital readmission  Interventions:  Transitions of Care:  Doctor Visits  - discussed the importance of doctor visits Encouraged patient to drink a lot of fluid. Reviewed energy levels and need for rest Confirmed dressing changes and ostomy care is going well. Confirmed patient has her new antibiotics and is taking as prescribed Reviewed pending appointments- pre op tomorrow Encouraged patient to eat well with increased protein Reviewed output of ostomy.  In basket message sent to PCP and urology to inform of current symptoms and concerns.  Patient informed of message from PCP- no response from urology.  Surgery (Perforated bowel, kidney stent): Reviewed that home health is coming today to assess surgical site. Reviewed pain and pull sensation to Bag that was placed on drain.  Patient reports the bad is not working for her. Very little drainage and states that drainage has changed and now has sediment. Patient concern for infection.  IR notified and will evaluation patient on 09/20/2023 at 745am 09/20/2023   Patient ID: Angela [name], female   DOB: 29-Jun-1950, 73 y.o.   MRN: [id number]   Reviewed IR message from today's visit.  Spoke with patient who did not understand that drain was draining abscess.  Radiologist reviewed this with the patient and the importance of keeping drain in at this time.  Patient reports she now understands and does not remember anyone telling her about the abscess.    Message received  from urology: Angela [name] - our office is rechecking a urine sample today to be safe. Unfortunately these symptoms are not uncommon with a stent in place, but wanted to make sure her UTI cleared with surgery upcoming. If her urine shows no infection, there are a few medicines our office can prescribe that should make the stent discomfort a little better and hopefully get her through her surgery   Thanks  Herlene Foot    1406: Received call from urology, [name].  States that urine culture should be back Friday afternoon and patient can call to check Friday afternoon. Call the office number and press option 6.  [name] suggested patient get AZO or any urinary pain relief over the counter and take 3 times per day. 1445:  patient informed.   Call to ID: Izetta Pouch, Director of Clinical Program Development/ [facility] program called ID for this RN Care manager. Attempting to assist in coordinating care and appointments. Spoke with Infectious Disease Nurse line. Unable to move Infectious Disease appointment sooner than 8.26.25. Recommends speaking to on call ID physician: Dr. Dea. Pager number: [phone]. Per Izetta Pouch: Dr. Dea would not see  this patient prior to 8.26.25 as an acute appointment. No other appointments available at this time. Izetta Pouch reviewed recent labs and history and physical with physician including ongoing urinary incontinence, dysuria and hesitancy. Reviewed antibiotic use and recent labs/diagnostics. Dr. Dea provided MRN for review but recommended no changes at this time and also not extending linezolid  timeframe. Recommend continued follow-up with urology. This RN made aware of updates on care coordination efforts via TEAMS.   This message forwarded to Dr. Lovie for his knowledge about recommendation from ID.    In basket message sent to PCP- Charlies Bellini to inform her of the above.  Patient also informed of Infectious disease appointment not being able  to be moved up.      Patient Self Care Activities:  Attend all scheduled provider appointments Call pharmacy for medication refills 3-7 days in advance of running out of medications Call provider office for new concerns or questions  Notify RN Care Manager of TOC call rescheduling needs Participate in Transition of Care Program/Attend TOC scheduled calls Take medications as prescribed   Stay hydrated Husband to continue to do dressing changes. Call home health for any new concerns. Call MD or seek emergency care if needed. Call [facility] on Friday afternoon about culture report Take AZO per recommendation from urology Continue to stay hydrated.    Plan:  Telephone follow up appointment with care management team member scheduled for:  09/25/2023 The patient has been provided with contact information for the care management team and has been advised to call with any health related questions or concerns.          Alan Ee, RN, BSN, CEN [employer]- Transition of Care Team.  [facility] [phone]

## 2023-09-20 NOTE — Patient Instructions (Signed)
 Visit Information  Thank you for taking time to visit with me today. Please don't hesitate to contact me if I can be of assistance to you before our next scheduled telephone appointment.  Our next appointment is by telephone on 09/25/2023   Following is a copy of your care plan:   Goals Addressed             This Visit's Progress    [facility] Transitions of Care (TOC) Care Plan       Problems:  Recent Hospitalization for treatment of bowel perforation 09/12/2023  Reports ostomy bag and dressing changes are doing well. 09/19/2023  Seen at the ostomy clinic and reports ostomy care is going well. Patient is having pasty brown stool. States it was suggested that she increase her miralax . Patient was seen by IR and drain was place to bag by gravity.  Patient reports the bag is not working well.  ( Reports the bag is huge-631ml).  Patient reports the bag is pulling and causing a lot of pain. Patient reports very little drain. States that drainage now looks like it has sediment in it.  Patient is going to call IT today about the pain and need to do something different. Patient scheduled to see IR on 8/13/205 at 745 am to evaluation of drain and site.  Home Health services barrier: confirmed that home health will start on 09/06/2023 09/12/2023  home health nurse coming today. 09/19/2023  Home health nurse came today, nurse reports mid abdominal incision site is healing well but slow.  Patient has increased her protein intake.  UTI- per husband 1 and a half plus gallons of urine in 24 hours. Patient unable to control urination. Wearing diapers and is incontinent. Last antibiotics tonight.  Positive urine culture. 09/12/2023 Saw PCP and still has a UTI. Continues to have urinary frequency. Antibiotics changed yesterday. 09/19/2023 Patient reports recent change of antibiotic. States she has 3 days left.  States she continues to have frequency ( 6 times per hour) and dysuria.  Dysuria pain 7/10.  Patient is pending pre op  tomorrow for kidney stone extraction on 09/26/2023. Denies fever but reports poor energy levels. Message back from PCP discuss with urology urgent and patient needs to see ID. Scheduled with ID on 10/03/2023- ( needs a sooner appointment)   Goal:  Over the next 30 days, the patient will not experience hospital readmission  Interventions:  Transitions of Care:  Doctor Visits  - discussed the importance of doctor visits Encouraged patient to drink a lot of fluid. Reviewed energy levels and need for rest Confirmed dressing changes and ostomy care is going well. Confirmed patient has her new antibiotics and is taking as prescribed Reviewed pending appointments- pre op tomorrow Encouraged patient to eat well with increased protein Reviewed output of ostomy.  In basket message sent to PCP and urology to inform of current symptoms and concerns.  Patient informed of message from PCP- no response from urology.  Surgery (Perforated bowel, kidney stent): Reviewed that home health is coming today to assess surgical site. Reviewed pain and pull sensation to Bag that was placed on drain.  Patient reports the bad is not working for her. Very little drainage and states that drainage has changed and now has sediment. Patient concern for infection.  IR notified and will evaluation patient on 09/20/2023 at 745am   Patient Self Care Activities:  Attend all scheduled provider appointments Call pharmacy for medication refills 3-7 days in advance of running out  of medications Call provider office for new concerns or questions  Notify RN Care Manager of TOC call rescheduling needs Participate in Transition of Care Program/Attend TOC scheduled calls Take medications as prescribed   Stay hydrated Husband to continue to do dressing changes. Call home health for any new concerns. Call MD or seek emergency care if needed. Go to IR appointment Go to preop appointment as planned unless you hear otherwise from a  medical professional   Plan:  Telephone follow up appointment with care management team member scheduled for:  09/25/2023 The patient has been provided with contact information for the care management team and has been advised to call with any health related questions or concerns.         Patient verbalizes understanding of instructions and care plan provided today and agrees to view in MyChart. Active MyChart status and patient understanding of how to access instructions and care plan via MyChart confirmed with patient.     Telephone follow up appointment with care management team member scheduled for:  09/25/2023  Please call the care guide team at [phone] if you need to cancel or reschedule your appointment.   Please call the Suicide and Crisis Lifeline: 988 call the [place]  National Suicide Prevention Lifeline: 313-541-6819 or TTY: (365)267-5755 TTY 678-130-3141) to talk to a trained counselor call 1-800-273-TALK (toll free, 24 hour hotline) call 911 if you are experiencing a Mental Health or Behavioral Health Crisis or need someone to talk to.  Alan Ee, RN, BSN, CEN [employer]- Transition of Care Team.  [facility] [phone]

## 2023-09-20 NOTE — Progress Notes (Signed)
 Patient ID: Angela [name], female   DOB: [birth year], 73 y.o.   MRN: [id number]   Referring Physician(s): Burton,Victoria  Supervising Physician: Vanice Revel  Chief Complaint: The patient is seen in follow up today for right TG drain  History of present illness:  Angela [name] is a 73 y.o. female who was admitted from 07/30/23 to 7/28 due to colitis with ischemia and perforated colon, underwent ex lap and Hartmann's by Dr. Polly procedure on 6/22. CT on 6/29 showed complex free fluid w/ air in the surgical bed concerning for superimposed infection vs bowel leak, she underwent R TG drain placement by Dr. Philip on 6/30.    CT on 7/5 showed additional intraabdominal fluid collection as well as retracted R TG drain, patient underwent R TG drain repositioning and RLQ drain placement on 7/7.    CT on 7/16 showed resolution of the fluid collection around the RLQ drain, injection on 7/18 confirmed no fistula,the RLQ drain was removed on 7/17. The R TG drain was not removed due to persistent output. Patient underwent R TG drain repositioning on 7/23, drain injection showed no fistula.  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 Was instructed to not flush and leave to gravity bag, which she has been doing since last visit.  Pt presenting back to clinic today for repeat drain injection due to persistent dull ache around right lower back. Currently reports she is also dealing with a UTI. No fevers/chills or additional complaints.  Past Medical History:  Diagnosis Date   Arthritis    Blood in stool    greater than 4 years since last incidence of blood in stool    Colon polyps    Glaucoma    Hydroureteronephrosis 07/2023   Hyperlipidemia    Hypertension    Inclusion cyst 2021   face   Perforation of sigmoid colon (HCC) 07/2023   Colitis   PNA (pneumonia) 2006   Sepsis (HCC) 07/2023   Strain of left wrist 04/16/2019    Past Surgical  History:  Procedure Laterality Date   ABSCESS DRAINAGE  2021   face- inclusion cyst   BREAST BIOPSY Left 07/19/2018   US  guided breast bx with clip placement- benign   COLON RESECTION SIGMOID  07/30/2023   Procedure: COLECTOMY, SIGMOID, OPEN;  Surgeon: Polly Cordella LABOR, MD;  Location: [facility] OR;  Service: General;;   CYSTOSCOPY W/ URETERAL STENT PLACEMENT Left 08/18/2023   Procedure: CYSTOSCOPY, WITH RETROGRADE PYELOGRAM AND URETERAL STENT INSERTION;  Surgeon: Lovie Arlyss CROME, MD;  Location: [facility] OR;  Service: Urology;  Laterality: Left;   EYE SURGERY  1956   HEMORRHOID SURGERY  2001   IR CATHETER TUBE CHANGE  08/30/2023   IR CV LINE INJECTION  08/24/2023   IR RADIOLOGIST EVAL & MGMT  09/15/2023   IR RADIOLOGIST EVAL & MGMT  09/20/2023   LAPAROTOMY N/A 07/30/2023   Procedure: LAPAROTOMY, EXPLORATORY;  Surgeon: Polly Cordella LABOR, MD;  Location: [facility] OR;  Service: General;  Laterality: N/A;   RADIOACTIVE SEED GUIDED EXCISIONAL BREAST BIOPSY Left 03/15/2019   Procedure: RADIOACTIVE SEED GUIDED EXCISIONAL LEFT  BREAST BIOPSY;  Surgeon: Gladis Cough, MD;  Location: [facility];  Service: General;  Laterality: Left;   TONSILLECTOMY  1963   WISDOM TOOTH EXTRACTION      Allergies: Codeine  Medications: Prior to Admission medications   Medication Sig Start Date End Date Taking? Authorizing Provider  acetaminophen  (TYLENOL ) 500  MG tablet Take 2 tablets (1,000 mg total) by mouth every 6 (six) hours as needed. 09/04/23   Perri DELENA Meliton Mickey., MD  albuterol  (VENTOLIN  HFA) 108 [phone] Base) MCG/ACT inhaler Inhale 2 puffs into the lungs every 6 (six) hours as needed for wheezing or shortness of breath. 03/03/22   Kuneff, Renee A, DO  carvedilol  (COREG ) 6.25 MG tablet Take 1 tablet (6.25 mg total) by mouth 2 (two) times daily with a meal. 09/06/23   Kuneff, Renee A, DO  ferrous sulfate  325 (65 FE) MG tablet Take 1 tablet (325 mg total) by mouth every other day. Follow your iron and anemia with your  PCP outpatient 09/04/23 10/04/23  Perri DELENA Meliton Mickey., MD  latanoprost  (XALATAN ) 0.005 % ophthalmic solution Place 1 drop into both eyes at bedtime. 08/11/17   [provider]  linezolid  (ZYVOX ) 600 MG tablet Take 1 tablet (600 mg total) by mouth 2 (two) times daily for 10 days. 09/12/23 09/22/23  Kuneff, Renee A, DO  lisinopril  (ZESTRIL ) 10 MG tablet Take 1 tablet (10 mg total) by mouth daily. 09/06/23   Kuneff, Renee A, DO  methocarbamol  (ROBAXIN ) 500 MG tablet Take 1 tablet (500 mg total) by mouth 2 (two) times daily as needed for muscle spasms. 09/06/23   Kuneff, Renee A, DO  Multiple Vitamin (MULTIVITAMIN) tablet Take 1 tablet by mouth daily.    [provider]  ondansetron  (ZOFRAN -ODT) 4 MG disintegrating tablet Take 1 tablet (4 mg total) by mouth every 8 (eight) hours as needed for nausea or vomiting. 09/06/23   Kuneff, Renee A, DO  pantoprazole  (PROTONIX ) 40 MG tablet Take 1 tablet (40 mg total) by mouth daily. 09/06/23   Kuneff, Renee A, DO  polyethylene glycol powder (GLYCOLAX /MIRALAX ) 17 GM/SCOOP powder Take 17 g by mouth daily. 09/04/23   Perri DELENA Meliton Mickey., MD  Probiotic Product (DAILY PROBIOTIC PO) Take 1 capsule by mouth daily at 12 noon. 09/19/23   [provider]  timolol  (TIMOPTIC ) 0.5 % ophthalmic solution Place 1 drop into both eyes daily. 11/14/19   [provider]     Family History  Problem Relation Age of Onset   Heart attack Father    Heart attack Sister    Hyperlipidemia Sister    Hypertension Sister     Social History   Socioeconomic History   Marital status: Married    Spouse name: Not on file   Number of children: Not on file   Years of education: Not on file   Highest education level: Bachelor's degree (e.g., BA, AB, BS)  Occupational History   Not on file  Tobacco Use   Smoking status: Former    Current packs/day: 0.00    Types: Cigars, Cigarettes   Smokeless tobacco: Never   Tobacco comments:    *occasional smoker    Vaping Use   Vaping status: Former  Substance and Sexual Activity   Alcohol use: Yes    Alcohol/week: 4.0 standard drinks of alcohol    Types: 4 Glasses of wine per week    Comment: *rarely    Drug use: No   Sexual activity: Yes    Partners: Male    Comment: Married  Other Topics Concern   Not on file  Social History Narrative   Married. Retired. 2 children.   Bachelors degree.   Exercises routinely.   Drinks caffeine.   Smoke alarm in the home. Wears her seatbelt. Owns firearms.   Feels safe in her relationships.  Social Drivers of Health   Financial Resource Strain: Patient Declined (04/19/2023)   Overall Financial Resource Strain (CARDIA)    Difficulty of Paying Living Expenses: Patient declined  Food Insecurity: Unknown (09/05/2023)   Hunger Vital Sign    Worried About Running Out of Food in the Last Year: Never true    Ran Out of Food in the Last Year: Not on file  Transportation Needs: No Transportation Needs (09/05/2023)   PRAPARE - Administrator, Civil Service (Medical): No    Lack of Transportation (Non-Medical): No  Physical Activity: Sufficiently Active (04/19/2023)   Exercise Vital Sign    Days of Exercise per Week: 5 days    Minutes of Exercise per Session: 30 min  Stress: No Stress Concern Present (04/19/2023)   [employer] of Occupational Health - Occupational Stress Questionnaire    Feeling of Stress : Not at all  Social Connections: Patient Declined (08/02/2023)   Social Connection and Isolation Panel    Frequency of Communication with Friends and Family: Patient declined    Frequency of Social Gatherings with Friends and Family: Patient declined    Attends Religious Services: Patient declined    Database administrator or Organizations: Patient declined    Attends Banker Meetings: Patient declined    Marital Status: Patient declined     Vital Signs: There were no vitals taken for this visit.  Physical Exam Vitals  and nursing note reviewed.  Constitutional:      Appearance: Normal appearance.  Pulmonary:     Effort: Pulmonary effort is normal.  Abdominal:     Palpations: Abdomen is soft.  Skin:    General: Skin is warm and dry.     Comments: + R TG drain to gravity bag. Mild ttp surrounding drain. No surrounding erythema or other external abnormality. Statlock in place and dressed appropriately.  Neurological:     Mental Status: She is alert and oriented to person, place, and time. Mental status is at baseline.     Imaging: IR Radiologist Eval & Mgmt Result Date: 09/20/2023 : Please see the dictated noted in [facility] from today for this IR rad eval and management. Electronically Signed   By: CHRISTELLA.  Shick M.D.   On: 09/20/2023 08:38    Labs:  CBC: Recent Labs    09/01/23 0324 09/02/23 0350 09/04/23 0340 09/06/23 1139  WBC 6.1 5.3 5.9 6.3  HGB 10.3* 9.9* 9.8* 11.1*  HCT 31.9* 31.1* 31.3* 34.3*  PLT 328 305 312 381.0    COAGS: Recent Labs    07/29/23 0826 08/07/23 0909 08/15/23 0431  INR 1.1 1.2 1.1    BMP: Recent Labs    08/31/23 0358 09/01/23 0324 09/02/23 0350 09/04/23 0340 09/06/23 1139  NA 137 137 136 137 137  K 4.1 3.6 3.9 3.4* 4.0  CL 102 103 102 103 99  CO2 26 27 25 27 30   GLUCOSE 108* 102* 94 94 99  BUN 23 20 16 13 17   CALCIUM  8.4* 8.6* 8.4* 8.5* 8.7  CREATININE 0.50 0.55 0.55 0.49 0.56  GFRNONAA >60 >60 >60 >60  --     LIVER FUNCTION TESTS: Recent Labs    08/31/23 0358 09/01/23 0324 09/02/23 0350 09/06/23 1139  BILITOT 0.5 0.6 0.3 0.3  AST 15 14* 14* 19  ALT 11 12 11 21   ALKPHOS 63 70 59 81  PROT 5.2* 5.3* 5.1* 6.0  ALBUMIN  2.0* 2.0* 2.1* 3.4*    Assessment and Plan:  73 year old female hx of colitis with ischemia and perforated colon s/p ex lap and Hartmann's by Dr. Polly procedure on 6/22, underwent R TG and RLQ drain placement for post op intraabdominal fluid collections. RLQ drain was removed on 7/17, she presents to the clinic for R TG  drain follow up.  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  Patient has not been flushing drain and has been to gravity bag since this visit.  Drain injection imaging obtained today reviewed by Dr. Majel recommends continued previous plan of no flushing and leaving to gravity bag.  Output has been minimal, roughly a few mL since last evaluation 5 days ago which is promising but given imaging findings, drain needing to remain in place.  Patient was concerned given lower back dull aching discomfort, but feels this is very well likely attributed to UTI and just wanted to make sure that the drain was okay.  Will plan on seeing patient in clinic for repeat injection and evaluation in 2 weeks.  Electronically Signed: Kimble VEAR Clas 09/20/2023, 8:44 AM   I spent a total of 15 Minutes in face to face in clinical consultation, greater than 50% of which was counseling/coordinating care for right transgluteal drain follow-up

## 2023-09-20 NOTE — Progress Notes (Addendum)
 For Anesthesia: PCP - Catherine Charlies LABOR, DO  Cardiologist - N/A  Bowel Prep reminder:  Chest x-ray - 07/29/23 EKG - 07/29/23 Stress Test -  ECHO - 08/07/23 Cardiac Cath -  Pacemaker/ICD device last checked: Pacemaker orders received: Device Rep notified:  Spinal Cord Stimulator:N/A  Sleep Study - N/A CPAP -   Fasting Blood Sugar - N/A Checks Blood Sugar _____ times a day Date and result of last Hgb A1c-  Last dose of GLP1 agonist- N/A GLP1 instructions:   Last dose of SGLT-2 inhibitors- N/A SGLT-2 instructions:   Blood Thinner Instructions:N/A Aspirin Instructions: Last Dose:  Activity level: Can go up a flight of stairs and activities of daily living without stopping and without chest pain and/or shortness of breath   Able to exercise without chest pain and/or shortness of breath  Anesthesia review:   Patient denies shortness of breath, fever, cough and chest pain at PAT appointment   Patient verbalized understanding of instructions that were reviewed over the telephone.

## 2023-09-21 ENCOUNTER — Telehealth: Payer: Self-pay

## 2023-09-21 DIAGNOSIS — N136 Pyonephrosis: Secondary | ICD-10-CM | POA: Diagnosis not present

## 2023-09-21 DIAGNOSIS — E872 Acidosis, unspecified: Secondary | ICD-10-CM | POA: Diagnosis not present

## 2023-09-21 DIAGNOSIS — I119 Hypertensive heart disease without heart failure: Secondary | ICD-10-CM | POA: Diagnosis not present

## 2023-09-21 DIAGNOSIS — T8143XD Infection following a procedure, organ and space surgical site, subsequent encounter: Secondary | ICD-10-CM | POA: Diagnosis not present

## 2023-09-21 DIAGNOSIS — K9189 Other postprocedural complications and disorders of digestive system: Secondary | ICD-10-CM | POA: Diagnosis not present

## 2023-09-21 DIAGNOSIS — I2510 Atherosclerotic heart disease of native coronary artery without angina pectoris: Secondary | ICD-10-CM | POA: Diagnosis not present

## 2023-09-21 DIAGNOSIS — T85528D Displacement of other gastrointestinal prosthetic devices, implants and grafts, subsequent encounter: Secondary | ICD-10-CM | POA: Diagnosis not present

## 2023-09-21 DIAGNOSIS — N739 Female pelvic inflammatory disease, unspecified: Secondary | ICD-10-CM | POA: Diagnosis not present

## 2023-09-21 DIAGNOSIS — D649 Anemia, unspecified: Secondary | ICD-10-CM | POA: Diagnosis not present

## 2023-09-21 NOTE — Telephone Encounter (Signed)
 Forms faxed

## 2023-09-21 NOTE — Telephone Encounter (Signed)
 Received fax from [facility]  Order # [id number]  [facility] inbox front office

## 2023-09-21 NOTE — Telephone Encounter (Signed)
Completed and placed in C[facility] work basket 

## 2023-09-25 ENCOUNTER — Other Ambulatory Visit: Payer: Self-pay

## 2023-09-25 NOTE — Patient Instructions (Signed)
 Visit Information  Thank you for taking time to visit with me today. Please don't hesitate to contact me if I can be of assistance to you before our next scheduled telephone appointment.  Our next appointment is by telephone on 09/27/2023 at 3pm  Following is a copy of your care plan:   Goals Addressed             This Visit's Progress    VBCI Transitions of Care (TOC) Care Plan       Problems:  Recent Hospitalization for treatment of bowel perforation 09/12/2023  Reports ostomy bag and dressing changes are doing well. 09/19/2023  Seen at the ostomy clinic and reports ostomy care is going well. Patient is having pasty brown stool. States it was suggested that she increase her miralax . Patient was seen by IR and drain was place to bag by gravity.  Patient reports the bag is not working well.  ( Reports the bag is huge-623ml).  Patient reports the bag is pulling and causing a lot of pain. Patient reports very little drain. States that drainage now looks like it has sediment in it.  Patient is going to call IT today about the pain and need to do something different. Patient scheduled to see IR on 8/13/205 at 745 am to evaluation of drain and site. 09/25/2023 Patient report that abdominal incision if healing well. Husband continues to do dressing changes.  Continues to have rectal drain with no drainage.   Home Health services barrier: confirmed that home health will start on 09/06/2023 09/12/2023  home health nurse coming today. 09/19/2023  Home health nurse came today, nurse reports mid abdominal incision site is healing well but slow.  Patient has increased her protein intake. 09/25/2023  Patient continues to have home health nursing for dressing changes and patient reports next visit is planned for 09/28/2023 UTI- per husband 1 and a half plus gallons of urine in 24 hours. Patient unable to control urination. Wearing diapers and is incontinent. Last antibiotics tonight.  Positive urine culture. 09/12/2023  Saw PCP and still has a UTI. Continues to have urinary frequency. Antibiotics changed yesterday. 09/19/2023 Patient reports recent change of antibiotic. States she has 3 days left.  States she continues to have frequency ( 6 times per hour) and dysuria.  Dysuria pain 7/10.  Patient is pending pre op tomorrow for kidney stone extraction on 09/26/2023. Denies fever but reports poor energy levels. Message back from PCP discuss with urology urgent and patient needs to see ID. Scheduled with ID on 10/03/2023- ( needs a sooner appointment)  09/25/2023  Urine culture came back negative.  Patient continues to have dysuria and frequency. Reports pyridum and celebrex has helped some.  Still voiding every 10-15 minutes and is now wearing diapers to be able to get some sleep.    Goal:  Over the next 30 days, the patient will not experience hospital readmission  Interventions:  Transitions of Care:  Doctor Visits  - discussed the importance of doctor visits Encouraged patient to drink a lot of fluid. Reviewed energy levels and need for rest Confirmed dressing changes and ostomy care is going well. Reviewed pending appointments- Outpatient surgery planned for tomorrow.  Encouraged patient to eat well with increased protein Reviewed output of ostomy and drain.   Surgery (Perforated bowel, kidney stent): Reviewed that home health is still active with home visits.  Reviewed pain  Reviewed pending outpatient procedure for tomorrow.  ( 9 mm kidney stone)Reviewed time of arrival  at 5:15 am   Patient self care activities:  Attend all scheduled provider appointments Call pharmacy for medication refills 3-7 days in advance of running out of medications Call provider office for new concerns or questions  Notify RN Care Manager of TOC call rescheduling needs Participate in Transition of Care Program/Attend TOC scheduled calls Take medications as prescribed   Stay hydrated Husband to continue to do dressing  changes. Call home health for any new concerns. Call MD or seek emergency care if needed. Take AZO per recommendation from urology Continue to stay hydrated.  Follow up pre op instructions.   Plan:  Telephone follow up appointment with care management team member scheduled for:  09/27/2023 at 3pm The patient has been provided with contact information for the care management team and has been advised to call with any health related questions or concerns.         Patient verbalizes understanding of instructions and care plan provided today and agrees to view in MyChart. Active MyChart status and patient understanding of how to access instructions and care plan via MyChart confirmed with patient.     Telephone follow up appointment with care management team member scheduled for:  09/27/2023 at 3pm  Please call the care guide team at [phone] if you need to cancel or reschedule your appointment.   Please call the Suicide and Crisis Lifeline: 988 call the [place]  National Suicide Prevention Lifeline: (310) 158-3120 or TTY: 843-433-0183 TTY 778-537-1728) to talk to a trained counselor call 1-800-273-TALK (toll free, 24 hour hotline) call 911 if you are experiencing a Mental Health or Behavioral Health Crisis or need someone to talk to.  Alan Ee, RN, BSN, CEN [employer]- Transition of Care Team.  [facility] [phone]

## 2023-09-25 NOTE — Transitions of Care (Post Inpatient/ED Visit) (Signed)
 Transition of Care week 4  Visit Note  09/25/2023  Name: Angela [name] MRN: [id number]          DOB: Oct 05, 1950  Situation: Patient enrolled in [facility] 30-day program. Visit completed with patient by telephone.   Background:   Initial Transition Care Management Follow-up Telephone Call    Past Medical History:  Diagnosis Date   Arthritis    Blood in stool    greater than 4 years since last incidence of blood in stool    Colon polyps    Glaucoma    Hydroureteronephrosis 07/2023   Hyperlipidemia    Hypertension    Inclusion cyst 2021   face   Perforation of sigmoid colon (HCC) 07/2023   Colitis   PNA (pneumonia) 2006   Sepsis (HCC) 07/2023   Strain of left wrist 04/16/2019    Assessment: Continue to have dysuria and frequency. Abdominal incision healing. Rectal drain not draining.  Pending outpatient surgery tomorrow.  Patient Reported Symptoms: Cognitive Cognitive Status: Able to follow simple commands, Alert and oriented to person, place, and time, Normal speech and language skills      Neurological Neurological Review of Symptoms: No symptoms reported    HEENT HEENT Symptoms Reported: No symptoms reported      Cardiovascular Cardiovascular Symptoms Reported: No symptoms reported    Respiratory Respiratory Symptoms Reported: No symptoms reported    Endocrine Endocrine Symptoms Reported: No symptoms reported Is patient diabetic?: No    Gastrointestinal Other Gastrointestinal Symptoms: Ostomy going well. Rectal drain without any drainage..  Reports eating about every 2 hours ( protein)   Continues to have wound care with home health and husband. Gastrointestinal Management Strategies: Colostomy, Incontinence garment/pad, Medication therapy    Genitourinary Genitourinary Symptoms Reported: Frequency, Pain/burning with urination, Urgency Other Genitourinary Symptoms: Continues to have urinary pain./ Taking AZO and celebrex.  Not much relief. Reports going to the BR  every 10-15 mintues and now states she is wearing a diaper. Additional Genitourinary Details: Penidng kidney stone surgery tomorrow. Urine culture is clear and patient has finished antibiotics. Genitourinary Management Strategies: Medical device, Incontinence garment/pad, Medication therapy Genitourinary Self-Management Outcome: 3 (uncertain) Genitourinary Comment: Patient looking forward to surgery tomorrow in hopes to start healing.  Integumentary Integumentary Symptoms Reported: Incision Additional Integumentary Details: Reports mid line incision healing well. Husband continues to do dressing changes. Reports rectal drain still very painful with no drainage since IR evalutaion last week. Skin Management Strategies: Medical device Skin Self-Management Outcome: 4 (good)  Musculoskeletal Musculoskelatal Symptoms Reviewed: No symptoms reported        Psychosocial Additional Psychological Details: Hopeful for good outcomes for surgery tomorrow.         There were no vitals filed for this visit.  Medications Reviewed Today     Reviewed by Rumalda Alan PENNER, RN (Registered Nurse) on 09/25/23 at [phone]  Med List Status: <None>   Medication Order Taking? Sig Documenting Provider Last Dose Status Informant  acetaminophen  (TYLENOL ) 500 MG tablet [id number] Yes Take 2 tablets (1,000 mg total) by mouth every 6 (six) hours as needed. Perri DELENA Meliton Mickey., MD  Active Self  albuterol  (VENTOLIN  HFA) 108 [id number] Base) MCG/ACT inhaler [id number] Yes Inhale 2 puffs into the lungs every 6 (six) hours as needed for wheezing or shortness of breath. Kuneff, Renee A, DO  Active Self  carvedilol  (COREG ) 6.25 MG tablet [id number] Yes Take 1 tablet (6.25 mg total) by mouth 2 (two) times daily with a meal. Kuneff, Renee A, DO  Active Self  celecoxib (CELEBREX) 100 MG capsule [id number] Yes Take 100 mg by mouth 2 (two) times daily. [provider]  Active   ferrous sulfate  325 (65 FE) MG tablet [id number] Yes Take 1  tablet (325 mg total) by mouth every other day. Follow your iron and anemia with your PCP outpatient Perri DELENA Meliton Mickey., MD  Active Self  latanoprost  (XALATAN ) 0.005 % ophthalmic solution [id number] Yes Place 1 drop into both eyes at bedtime. [provider]  Active Self  lisinopril  (ZESTRIL ) 10 MG tablet [id number] Yes Take 1 tablet (10 mg total) by mouth daily. Kuneff, Renee A, DO  Active Self  methocarbamol  (ROBAXIN ) 500 MG tablet [id number] Yes Take 1 tablet (500 mg total) by mouth 2 (two) times daily as needed for muscle spasms. Catherine, Renee A, DO  Active Self  Multiple Vitamin (MULTIVITAMIN) tablet [id number] Yes Take 1 tablet by mouth daily. [provider]  Active Self  ondansetron  (ZOFRAN -ODT) 4 MG disintegrating tablet [id number] Yes Take 1 tablet (4 mg total) by mouth every 8 (eight) hours as needed for nausea or vomiting. Kuneff, Renee A, DO  Active Self  pantoprazole  (PROTONIX ) 40 MG tablet [id number] Yes Take 1 tablet (40 mg total) by mouth daily. Kuneff, Renee A, DO  Active Self  phenazopyridine (PYRIDIUM) 97 MG tablet [id number] Yes Take 97 mg by mouth 3 (three) times daily. [provider]  Active   polyethylene glycol powder (GLYCOLAX /MIRALAX ) 17 GM/SCOOP powder [id number] Yes Take 17 g by mouth daily. Perri DELENA Meliton Mickey., MD  Active Self  Probiotic Product (DAILY PROBIOTIC PO) [id number] Yes Take 1 capsule by mouth daily at 12 noon. [provider]  Active Self  timolol  (TIMOPTIC ) 0.5 % ophthalmic solution [id number] Yes Place 1 drop into both eyes daily. [provider]  Active Self            Goals      Patient Stated     Eat healthier, drink more water & increase activity     Patient Stated     Stay active and healthy     Patient Stated     Stay healthy      Patient Stated     Continue current lifestyle     [facility] Transitions of Care (TOC) Care Plan     Problems:  Recent Hospitalization for treatment of bowel perforation  09/12/2023  Reports ostomy bag and dressing changes are doing well. 09/19/2023  Seen at the ostomy clinic and reports ostomy care is going well. Patient is having pasty brown stool. States it was suggested that she increase her miralax . Patient was seen by IR and drain was place to bag by gravity.  Patient reports the bag is not working well.  ( Reports the bag is huge-671ml).  Patient reports the bag is pulling and causing a lot of pain. Patient reports very little drain. States that drainage now looks like it has sediment in it.  Patient is going to call IT today about the pain and need to do something different. Patient scheduled to see IR on 8/13/205 at 745 am to evaluation of drain and site. 09/25/2023 Patient report that abdominal incision if healing well. Husband continues to do dressing changes.  Continues to have rectal drain with no drainage.   Home Health services barrier: confirmed that home health will start on 09/06/2023 09/12/2023  home health nurse coming today. 09/19/2023  Home health nurse came today, nurse reports mid abdominal incision site is healing well  but slow.  Patient has increased her protein intake. 09/25/2023  Patient continues to have home health nursing for dressing changes and patient reports next visit is planned for 09/28/2023 UTI- per husband 1 and a half plus gallons of urine in 24 hours. Patient unable to control urination. Wearing diapers and is incontinent. Last antibiotics tonight.  Positive urine culture. 09/12/2023 Saw PCP and still has a UTI. Continues to have urinary frequency. Antibiotics changed yesterday. 09/19/2023 Patient reports recent change of antibiotic. States she has 3 days left.  States she continues to have frequency ( 6 times per hour) and dysuria.  Dysuria pain 7/10.  Patient is pending pre op tomorrow for kidney stone extraction on 09/26/2023. Denies fever but reports poor energy levels. Message back from PCP discuss with urology urgent and patient needs to see  ID. Scheduled with ID on 10/03/2023- ( needs a sooner appointment)  09/25/2023  Urine culture came back negative.  Patient continues to have dysuria and frequency. Reports pyridum and celebrex has helped some.  Still voiding every 10-15 minutes and is now wearing diapers to be able to get some sleep.    Goal:  Over the next 30 days, the patient will not experience hospital readmission  Interventions:  Transitions of Care:  Doctor Visits  - discussed the importance of doctor visits Encouraged patient to drink a lot of fluid. Reviewed energy levels and need for rest Confirmed dressing changes and ostomy care is going well. Reviewed pending appointments- Outpatient surgery planned for tomorrow.  Encouraged patient to eat well with increased protein Reviewed output of ostomy and drain.   Surgery (Perforated bowel, kidney stent): Reviewed that home health is still active with home visits.  Reviewed pain  Reviewed pending outpatient procedure for tomorrow.  ( 9 mm kidney stone)Reviewed time of arrival at 5:15 am   Patient self care activities:  Attend all scheduled provider appointments Call pharmacy for medication refills 3-7 days in advance of running out of medications Call provider office for new concerns or questions  Notify RN Care Manager of TOC call rescheduling needs Participate in Transition of Care Program/Attend TOC scheduled calls Take medications as prescribed   Stay hydrated Husband to continue to do dressing changes. Call home health for any new concerns. Call MD or seek emergency care if needed. Take AZO per recommendation from urology Continue to stay hydrated.  Follow up pre op instructions.   Plan:  Telephone follow up appointment with care management team member scheduled for:  09/27/2023 at 3pm The patient has been provided with contact information for the care management team and has been advised to call with any health related questions or concerns.           Recommendation:   Continue Current Plan of Care  Follow Up Plan:   Telephone follow up appointment date/time:  09/27/2023  at 3pm  Alan Ee, RN, BSN, CEN Population Health- Transition of Care Team.  [facility] [phone]

## 2023-09-26 ENCOUNTER — Ambulatory Visit (HOSPITAL_COMMUNITY)
Admission: RE | Admit: 2023-09-26 | Discharge: 2023-09-26 | Disposition: A | Discharge status: Home / Self Care | Source: Ambulatory Visit | Attending: Urology | Admitting: Urology

## 2023-09-26 ENCOUNTER — Ambulatory Visit (HOSPITAL_COMMUNITY): Payer: Self-pay | Admitting: Medical

## 2023-09-26 ENCOUNTER — Other Ambulatory Visit: Payer: Self-pay

## 2023-09-26 ENCOUNTER — Encounter (HOSPITAL_COMMUNITY): Payer: Self-pay | Admitting: Urology

## 2023-09-26 ENCOUNTER — Encounter (HOSPITAL_COMMUNITY)
Admission: RE | Disposition: A | Discharge status: Home / Self Care | Payer: Self-pay | Source: Ambulatory Visit | Attending: Urology

## 2023-09-26 ENCOUNTER — Ambulatory Visit (HOSPITAL_BASED_OUTPATIENT_CLINIC_OR_DEPARTMENT_OTHER): Admitting: Anesthesiology

## 2023-09-26 ENCOUNTER — Ambulatory Visit (HOSPITAL_COMMUNITY)

## 2023-09-26 DIAGNOSIS — K219 Gastro-esophageal reflux disease without esophagitis: Secondary | ICD-10-CM | POA: Insufficient documentation

## 2023-09-26 DIAGNOSIS — I10 Essential (primary) hypertension: Secondary | ICD-10-CM | POA: Insufficient documentation

## 2023-09-26 DIAGNOSIS — Z79899 Other long term (current) drug therapy: Secondary | ICD-10-CM | POA: Diagnosis not present

## 2023-09-26 DIAGNOSIS — N201 Calculus of ureter: Secondary | ICD-10-CM | POA: Diagnosis not present

## 2023-09-26 DIAGNOSIS — M1990 Unspecified osteoarthritis, unspecified site: Secondary | ICD-10-CM | POA: Insufficient documentation

## 2023-09-26 HISTORY — PX: CYSTOSCOPY/URETEROSCOPY/HOLMIUM LASER/STENT PLACEMENT: SHX6546

## 2023-09-26 SURGERY — CYSTOSCOPY/URETEROSCOPY/HOLMIUM LASER/STENT PLACEMENT
Anesthesia: General | Laterality: Left

## 2023-09-26 MED ORDER — CEFAZOLIN SODIUM-DEXTROSE 2-4 GM/100ML-% IV SOLN
2.0000 g | INTRAVENOUS | Status: AC
  Administered 2023-09-26: 2 g via INTRAVENOUS
  Filled 2023-09-26: qty 100

## 2023-09-26 MED ORDER — FENTANYL CITRATE (PF) 100 MCG/2ML IJ SOLN
INTRAMUSCULAR | Status: DC | PRN
  Administered 2023-09-26 (×2): 25 ug via INTRAVENOUS
  Administered 2023-09-26 (×3): 50 ug via INTRAVENOUS

## 2023-09-26 MED ORDER — PROPOFOL 10 MG/ML IV BOLUS
INTRAVENOUS | Status: AC
  Filled 2023-09-26: qty 20

## 2023-09-26 MED ORDER — IOHEXOL 300 MG/ML  SOLN
INTRAMUSCULAR | Status: DC | PRN
  Administered 2023-09-26: 20 mL

## 2023-09-26 MED ORDER — CHLORHEXIDINE GLUCONATE 0.12 % MT SOLN
15.0000 mL | Freq: Once | OROMUCOSAL | Status: AC
  Administered 2023-09-26: 15 mL via OROMUCOSAL

## 2023-09-26 MED ORDER — TRAMADOL HCL 50 MG PO TABS
50.0000 mg | ORAL_TABLET | Freq: Four times a day (QID) | ORAL | 0 refills | Status: AC | PRN
Start: 2023-09-26 — End: 2023-09-29

## 2023-09-26 MED ORDER — LIDOCAINE HCL (PF) 2 % IJ SOLN
INTRAMUSCULAR | Status: DC | PRN
  Administered 2023-09-26: 80 mg via INTRADERMAL

## 2023-09-26 MED ORDER — NITROFURANTOIN MONOHYD MACRO 100 MG PO CAPS
100.0000 mg | ORAL_CAPSULE | Freq: Two times a day (BID) | ORAL | 0 refills | Status: AC
Start: 2023-09-26 — End: 2023-10-01

## 2023-09-26 MED ORDER — EPHEDRINE SULFATE (PRESSORS) 50 MG/ML IJ SOLN
INTRAMUSCULAR | Status: DC | PRN
  Administered 2023-09-26: 5 mg via INTRAVENOUS

## 2023-09-26 MED ORDER — LACTATED RINGERS IV SOLN: INTRAVENOUS | Status: DC

## 2023-09-26 MED ORDER — PROPOFOL 10 MG/ML IV BOLUS
INTRAVENOUS | Status: DC | PRN
  Administered 2023-09-26: 130 mg via INTRAVENOUS

## 2023-09-26 MED ORDER — ORAL CARE MOUTH RINSE: 15.0000 mL | Freq: Once | OROMUCOSAL | Status: AC

## 2023-09-26 MED ORDER — SODIUM CHLORIDE 0.9 % IR SOLN
Status: DC | PRN
  Administered 2023-09-26: 3000 mL

## 2023-09-26 MED ORDER — FENTANYL CITRATE (PF) 100 MCG/2ML IJ SOLN
INTRAMUSCULAR | Status: AC
  Filled 2023-09-26: qty 2

## 2023-09-26 MED ORDER — HYOSCYAMINE SULFATE SL 0.125 MG SL SUBL: 1.0000 | SUBLINGUAL_TABLET | Freq: Four times a day (QID) | SUBLINGUAL | 1 refills | Status: AC | PRN

## 2023-09-26 MED ORDER — ONDANSETRON HCL 4 MG/2ML IJ SOLN
INTRAMUSCULAR | Status: DC | PRN
  Administered 2023-09-26: 4 mg via INTRAVENOUS

## 2023-09-26 MED ORDER — FENTANYL CITRATE PF 50 MCG/ML IJ SOSY: 25.0000 ug | PREFILLED_SYRINGE | INTRAMUSCULAR | Status: DC | PRN

## 2023-09-26 MED ORDER — PHENYLEPHRINE HCL (PRESSORS) 10 MG/ML IV SOLN
INTRAVENOUS | Status: DC | PRN
  Administered 2023-09-26 (×2): 80 ug via INTRAVENOUS

## 2023-09-26 MED ORDER — ACETAMINOPHEN 10 MG/ML IV SOLN: 1000.0000 mg | Freq: Once | INTRAVENOUS | Status: DC | PRN

## 2023-09-26 MED ORDER — DEXAMETHASONE SODIUM PHOSPHATE 10 MG/ML IJ SOLN
INTRAMUSCULAR | Status: DC | PRN
  Administered 2023-09-26: 5 mg via INTRAVENOUS

## 2023-09-26 MED ORDER — LACTATED RINGERS IV SOLN: INTRAVENOUS | Status: DC | PRN

## 2023-09-26 SURGICAL SUPPLY — 22 items
BAG COUNTER SPONGE SURGICOUNT (BAG) IMPLANT
BAG URO CATCHER STRL LF (MISCELLANEOUS) ×1 IMPLANT
BASKET ZERO TIP NITINOL 2.4FR (BASKET) IMPLANT
CATH URETERAL DUAL LUMEN 10F (MISCELLANEOUS) ×1 IMPLANT
CATH URETL OPEN END 6FR 70 (CATHETERS) IMPLANT
CLOTH BEACON ORANGE TIMEOUT ST (SAFETY) ×1 IMPLANT
GLOVE SS BIOGEL STRL SZ 7 (GLOVE) ×1 IMPLANT
GOWN STRL REUS W/ TWL XL LVL3 (GOWN DISPOSABLE) ×1 IMPLANT
GUIDEWIRE ANG ZIPWIRE 035X150 (WIRE) IMPLANT
GUIDEWIRE STR DUAL SENSOR (WIRE) IMPLANT
GUIDEWIRE ZIPWRE .038 STRAIGHT (WIRE) ×1 IMPLANT
IV NS 1000ML BAXH (IV SOLUTION) ×1 IMPLANT
KIT TURNOVER KIT A (KITS) ×1 IMPLANT
MANIFOLD NEPTUNE II (INSTRUMENTS) ×1 IMPLANT
PACK CYSTO (CUSTOM PROCEDURE TRAY) ×1 IMPLANT
SHEATH NAVIGATOR HD 11/13X28 (SHEATH) IMPLANT
SHEATH NAVIGATOR HD 11/13X36 (SHEATH) IMPLANT
STENT URET 6FRX24 CONTOUR (STENTS) IMPLANT
TRACTIP FLEXIVA PULS ID 200XHI (Laser) IMPLANT
TRACTIP FLEXIVA PULSE ID 200 (Laser) IMPLANT
TUBING CONNECTING 10 (TUBING) ×1 IMPLANT
TUBING UROLOGY SET (TUBING) ×1 IMPLANT

## 2023-09-26 NOTE — Discharge Instructions (Addendum)
[facility] [phone] Post Ureteroscopy With or Without Stent Instructions **remove stent by pulling on string Friday morning  Definitions:  Ureter: The duct that transports urine from the kidney to the bladder. Stent:   A plastic hollow tube that is placed into the ureter, from the kidney to the bladder to prevent the ureter from swelling shut.  GENERAL INSTRUCTIONS:  Despite the fact that no skin incisions were used, the area around the ureter and bladder is raw and irritated. The stent is a foreign body which will further irritate the bladder wall. This irritation is manifested by increased frequency of urination, both day and night, and by an increase in the urge to urinate. In some, the urge to urinate is present almost always. Sometimes the urge is strong enough that you may not be able to stop yourself from urinating. The only real cure is to remove the stent and then give time for the bladder wall to heal which can't be done until the danger of the ureter swelling shut has passed, which varies.  You may see some blood in your urine while the stent is in place and a few days afterwards. Do not be alarmed, even if the urine was clear for a while. Get off your feet and drink lots of fluids until clearing occurs. If you start to pass clots or don't improve, call us.  DIET: You may return to your normal diet immediately. Because of the raw surface of your bladder, alcohol, spicy foods, acid type foods and drinks with caffeine may cause irritation or frequency and should be used in moderation. To keep your urine flowing freely and to avoid constipation, drink plenty of fluids during the day ( 8-10 glasses ). Tip: Avoid cranberry juice because it is very acidic.  ACTIVITY: Your physical activity doesn't need to be restricted. However, if you are very active, you may see some blood in your urine. We suggest that you reduce your activity under these circumstances until the  bleeding has stopped.  BOWELS: It is important to keep your bowels regular during the postoperative period. Straining with bowel movements can cause bleeding. A bowel movement every other day is reasonable. Use a mild laxative if needed, such as Milk of Magnesia 2-3 tablespoons, or 2 Dulcolax tablets. Call if you continue to have problems. If you have been taking narcotics for pain, before, during or after your surgery, you may be constipated. Take a laxative if necessary.   MEDICATION: You should resume your pre-surgery medications unless told not to. In addition you will often be given an antibiotic to prevent infection and likely several as needed medications for stent related discomfort. These should be taken as prescribed until the bottles are finished unless you are having an unusual reaction to one of the drugs.  PROBLEMS YOU SHOULD REPORT TO US: Fevers over 100.5 Fahrenheit. Heavy bleeding, or clots ( See above notes about blood in urine ). Inability to urinate. Drug reactions ( hives, rash, nausea, vomiting, diarrhea ). Severe burning or pain with urination that is not improving.      

## 2023-09-26 NOTE — Op Note (Signed)
 Operative Note  Preoperative diagnosis:  1.  Left ureteral stone  Postoperative diagnosis: 1.  same  Procedure(s): 1.  Left ureteroscopy with laser lithotripsy and basket extraction of stones 2. Left retrograde pyelogram 3. Left ureteral stent placement 6x24 cm  Surgeon: Herlene Foot, MD  Assistants:  None  Anesthesia:  General  Complications:  None  EBL: Minimal  Specimens: 1. Stone fragments  Drains/Catheters: 1.  Left 6Fr x 24cm ureteral stent with tether string  Intraoperative findings:   Cystoscopy demonstrated unremarkable bladder, stent Left Ureteroscopy demonstrated stone in ureter around pelvic inlet. Stone mostly dusted, with basket removal of a few fragments Successful stent placement.  Left Retrograde Pyelogram: stone visible on initial KUB. There was hydronephrosis of the kidney, no filling defects    Description of procedure: After informed consent was obtained from the patient, the patient was identified and taken to the operating room and placed in the supine position.  General anesthesia was administered as well as perioperative IV antibiotics.  At the beginning of the case, a time-out was performed to properly identify the patient, the surgery to be performed, and the surgical site.  Sequential compression devices were applied to the lower extremities at the beginning of the case for DVT prophylaxis.  The patient was then placed in the dorsal lithotomy supine position, prepped and draped in sterile fashion.  We then passed the 21-French rigid cystoscope through the urethra and into the bladder under vision without any difficulty.  A systematic evaluation of the bladder revealed no evidence of any suspicious bladder lesions.  Ureteral orifices were in normal position.    The distal aspect of the ureteral stent was seen protruding from the left ureteral orifice.  We then used the alligator-tooth forceps and grasped the distal end of the ureteral stent and  brought it out the urethral meatus while watching the proximal coil straighten out nicely on fluoroscopy. Through the ureteral stent, we then passed a 0.038 glide wire up to the level of the renal pelvis.  The ureteral stent was then removed, leaving the glide wire up the left ureter.  The cystoscope was withdrawn. I inserted a semi-rigid up the left ureter but could not quite reach the stone. Retrograde was performed with findings as above. I inserted a second wire up the ureter and withdrew the semi rigid scope. An access sheath (11/13) was carefully inserted and deployed just distal to the stone.    A flexible scope was inserted and the calculus was identified. Using the 242 micron holmium laser fiber, the stone was dusted. There were 3-4 residual fragments 3-4 mm that I removed with a basket. I then advanced the ureteroscope into the collecting system. a gentle pyelogram was performed to delineate the calyceal system and we evaluated the calyces systematically. We encountered no further stones. The calyces were re-inspected and there were no significant stone fragment residual.   We then withdrew the ureteroscope back down the ureter along with the access sheath, noting no evidence of any stones along the course of the ureter.  Prior to removing the ureteroscope, we did pass the Glidewire back up to the ureter to the renal pelvis.  Once the ureteroscope was removed, we then used the Glidewire under fluoroscopic guidance and passed up a 6-French x 24 cm double-pigtail ureteral stent up the ureter, making sure that the proximal and distal ends coiled within the kidney and bladder respectively.  Note that we left a tether string attached to the distal end of  the ureteral stent and it exited the urethral meatus with a short tether.  The cystoscope was then advanced back into the bladder under vision.  We were able to see the distal stent coiling nicely within the bladder.  The bladder was then emptied with  irrigation solution.  The cystoscope was then removed.    The patient tolerated the procedure well and there was no complication. Patient was awoken from anesthesia and taken to the recovery room in stable condition. I was present and scrubbed for the entirety of the case.  Plan:  Patient will be discharged home and may remove stent in 3 days   G. Herlene Foot MD [facility]  Pager: [phone]

## 2023-09-26 NOTE — Anesthesia Procedure Notes (Signed)
 Procedure Name: LMA Insertion Date/Time: 09/26/2023 7:52 AM  Performed by: Dartha Meckel, CRNAPre-anesthesia Checklist: Patient identified, Emergency Drugs available, Suction available and Patient being monitored Patient Re-evaluated:Patient Re-evaluated prior to induction Oxygen Delivery Method: Circle system utilized Preoxygenation: Pre-oxygenation with 100% oxygen Induction Type: IV induction Ventilation: Mask ventilation without difficulty LMA: LMA inserted LMA Size: 4.0 Tube type: Oral Number of attempts: 1 Airway Equipment and Method: Stylet and Oral airway Placement Confirmation: positive ETCO2 and breath sounds checked- equal and bilateral Tube secured with: Tape Dental Injury: Teeth and Oropharynx as per pre-operative assessment

## 2023-09-26 NOTE — Transfer of Care (Signed)
 Immediate Anesthesia Transfer of Care Note  Patient: Angela [name]  Procedure(s) Performed: CYSTOSCOPY/URETEROSCOPY/HOLMIUM LASER/STENT PLACEMENT (Left)  Patient Location: PACU  Anesthesia Type:General  Level of Consciousness: awake and alert   Airway & Oxygen Therapy: Patient Spontanous Breathing and Patient connected to nasal cannula oxygen  Post-op Assessment: Report given to RN and Post -op Vital signs reviewed and stable  Post vital signs: Reviewed and stable  Last Vitals:  Vitals Value Taken Time  BP 162/94 09/26/23 09:26  Temp    Pulse 68 09/26/23 09:28  Resp 8 09/26/23 09:28  SpO2 98 % 09/26/23 09:28  Vitals shown include unfiled device data.  Last Pain:  Vitals:   09/26/23 0559  TempSrc:   PainSc: 0-No pain      Patients Stated Pain Goal: 5 (09/26/23 0559)  Complications: No notable events documented.

## 2023-09-26 NOTE — Anesthesia Preprocedure Evaluation (Addendum)
 Anesthesia Evaluation  Patient identified by MRN, date of birth, ID band Patient awake    Reviewed: Allergy & Precautions, NPO status , Patient's Chart, lab work & pertinent test results  History of Anesthesia Complications Negative for: history of anesthetic complications  Airway Mallampati: III  TM Distance: >3 FB Neck ROM: Full    Dental  (+) Dental Advisory Given, Teeth Intact   Pulmonary neg shortness of breath, neg sleep apnea, neg COPD, neg recent URI, former smoker   breath sounds clear to auscultation       Cardiovascular hypertension, Pt. on home beta blockers and Pt. on medications (-) angina (-) Past MI and (-) CHF (-) dysrhythmias  Rhythm:Regular   1. Left ventricular ejection fraction, by estimation, is 60 to 65%. The  left ventricle has normal function. The left ventricle has no regional  wall motion abnormalities. There is mild concentric left ventricular  hypertrophy. Left ventricular diastolic  parameters were normal.   2. Right ventricular systolic function is normal. The right ventricular  size is normal.   3. The mitral valve is normal in structure. Trivial mitral valve  regurgitation. No evidence of mitral stenosis.   4. The aortic valve is normal in structure. Aortic valve regurgitation is  not visualized. No aortic stenosis is present.   5. The inferior vena cava is normal in size with greater than 50%  respiratory variability, suggesting right atrial pressure of 3 mmHg.     Neuro/Psych negative neurological ROS  negative psych ROS   GI/Hepatic Neg liver ROS,GERD  Medicated and Controlled,,  Endo/Other    Renal/GU Renal diseaseLab Results      Component                Value               Date                      NA                       137                 09/06/2023                K                        4.0                 09/06/2023                CO2                      30                   09/06/2023                GLUCOSE                  99                  09/06/2023                BUN                      17                  09/06/2023  CREATININE               0.56                09/06/2023                CALCIUM                   8.7                 09/06/2023                GFR                      92.22               09/06/2023                GFRNONAA                 >60                 09/04/2023                Musculoskeletal  (+) Arthritis ,    Abdominal   Peds  Hematology  (+) Blood dyscrasia, anemia Lab Results      Component                Value               Date                      WBC                      6.3                 09/06/2023                HGB                      11.1 (L)            09/06/2023                HCT                      34.3 (L)            09/06/2023                MCV                      90.2                09/06/2023                PLT                      381.0               09/06/2023              Anesthesia Other Findings   Reproductive/Obstetrics                              Anesthesia Physical Anesthesia Plan  ASA: 2  Anesthesia Plan: General   Post-op Pain Management: Minimal or no pain anticipated  Induction: Intravenous  PONV Risk Score and Plan: 3 and Ondansetron  and Dexamethasone   Airway Management Planned: LMA and Oral ETT  Additional Equipment: None  Intra-op Plan:   Post-operative Plan: Extubation in OR  Informed Consent: I have reviewed the patients History and Physical, chart, labs and discussed the procedure including the risks, benefits and alternatives for the proposed anesthesia with the patient or authorized representative who has indicated his/her understanding and acceptance.     Dental advisory given  Plan Discussed with: CRNA  Anesthesia Plan Comments:          Anesthesia Quick Evaluation

## 2023-09-26 NOTE — H&P (Signed)
 H&P  History of Present Illness: Angela [name] is a 73 y.o. year old F who presents today for treatment of a left ureteral stone  No acute complaints  Past Medical History:  Diagnosis Date   Arthritis    Blood in stool    greater than 4 years since last incidence of blood in stool    Colon polyps    Glaucoma    Hydroureteronephrosis 07/2023   Hyperlipidemia    Hypertension    Inclusion cyst 2021   face   Perforation of sigmoid colon (HCC) 07/2023   Colitis   PNA (pneumonia) 2006   Sepsis (HCC) 07/2023   Strain of left wrist 04/16/2019    Past Surgical History:  Procedure Laterality Date   ABSCESS DRAINAGE  2021   face- inclusion cyst   BREAST BIOPSY Left 07/19/2018   US  guided breast bx with clip placement- benign   COLON RESECTION SIGMOID  07/30/2023   Procedure: COLECTOMY, SIGMOID, OPEN;  Surgeon: Polly Cordella LABOR, MD;  Location: [facility] OR;  Service: General;;   CYSTOSCOPY W/ URETERAL STENT PLACEMENT Left 08/18/2023   Procedure: CYSTOSCOPY, WITH RETROGRADE PYELOGRAM AND URETERAL STENT INSERTION;  Surgeon: Lovie Arlyss CROME, MD;  Location: [facility] OR;  Service: Urology;  Laterality: Left;   EYE SURGERY  1956   HEMORRHOID SURGERY  2001   IR CATHETER TUBE CHANGE  08/30/2023   IR CV LINE INJECTION  08/24/2023   IR RADIOLOGIST EVAL & MGMT  09/15/2023   IR RADIOLOGIST EVAL & MGMT  09/20/2023   LAPAROTOMY N/A 07/30/2023   Procedure: LAPAROTOMY, EXPLORATORY;  Surgeon: Polly Cordella LABOR, MD;  Location: [facility] OR;  Service: General;  Laterality: N/A;   RADIOACTIVE SEED GUIDED EXCISIONAL BREAST BIOPSY Left 03/15/2019   Procedure: RADIOACTIVE SEED GUIDED EXCISIONAL LEFT  BREAST BIOPSY;  Surgeon: Gladis Cough, MD;  Location: [facility];  Service: General;  Laterality: Left;   TONSILLECTOMY  1963   WISDOM TOOTH EXTRACTION      Home Medications:  Current Meds  Medication Sig   acetaminophen  (TYLENOL ) 500 MG tablet Take 2 tablets (1,000 mg total) by mouth every 6 (six) hours as  needed.   carvedilol  (COREG ) 6.25 MG tablet Take 1 tablet (6.25 mg total) by mouth 2 (two) times daily with a meal.   celecoxib (CELEBREX) 100 MG capsule Take 100 mg by mouth 2 (two) times daily.   ferrous sulfate  325 (65 FE) MG tablet Take 1 tablet (325 mg total) by mouth every other day. Follow your iron and anemia with your PCP outpatient   latanoprost  (XALATAN ) 0.005 % ophthalmic solution Place 1 drop into both eyes at bedtime.   [EXPIRED] linezolid  (ZYVOX ) 600 MG tablet Take 1 tablet (600 mg total) by mouth 2 (two) times daily for 10 days.   lisinopril  (ZESTRIL ) 10 MG tablet Take 1 tablet (10 mg total) by mouth daily.   methocarbamol  (ROBAXIN ) 500 MG tablet Take 1 tablet (500 mg total) by mouth 2 (two) times daily as needed for muscle spasms.   Multiple Vitamin (MULTIVITAMIN) tablet Take 1 tablet by mouth daily.   ondansetron  (ZOFRAN -ODT) 4 MG disintegrating tablet Take 1 tablet (4 mg total) by mouth every 8 (eight) hours as needed for nausea or vomiting.   pantoprazole  (PROTONIX ) 40 MG tablet Take 1 tablet (40 mg total) by mouth daily.   phenazopyridine (PYRIDIUM) 97 MG tablet Take 97 mg by mouth 3 (three) times daily.   polyethylene glycol powder (GLYCOLAX /MIRALAX ) 17 GM/SCOOP powder Take 17 g  by mouth daily.   Probiotic Product (DAILY PROBIOTIC PO) Take 1 capsule by mouth daily at 12 noon.   timolol  (TIMOPTIC ) 0.5 % ophthalmic solution Place 1 drop into both eyes daily.    Allergies:  Allergies  Allergen Reactions   Codeine Other (See Comments)    Irritable , insomnia     Family History  Problem Relation Age of Onset   Heart attack Father    Heart attack Sister    Hyperlipidemia Sister    Hypertension Sister     Social History:  reports that she has quit smoking. Her smoking use included cigars and cigarettes. She has been exposed to tobacco smoke. She has never used smokeless tobacco. She reports that she does not currently use alcohol after a past usage of about 4.0 standard  drinks of alcohol per week. She reports that she does not use drugs.  ROS: A complete review of systems was performed.  All systems are negative except for pertinent findings as noted.  Physical Exam:  Vital signs in last 24 hours: Temp:  [98 F (36.7 C)] 98 F (36.7 C) (08/19 0548) Pulse Rate:  [68] 68 (08/19 0548) Resp:  [16] 16 (08/19 0548) BP: (128)/(77) 128/77 (08/19 0548) SpO2:  [98 %] 98 % (08/19 0548) Weight:  [51.3 kg] 51.3 kg (08/19 0559) Constitutional:  Alert and oriented, No acute distress Cardiovascular: Regular rate and rhythm Respiratory: Normal respiratory effort, Lungs clear bilaterally GI: Abdomen is soft, nontender, nondistended, no abdominal masses Lymphatic: No lymphadenopathy Neurologic: Grossly intact, no focal deficits Psychiatric: Normal mood and affect   Laboratory Data:  No results for input(s): WBC, HGB, HCT, PLT in the last 72 hours.  No results for input(s): NA, K, CL, GLUCOSE, BUN, CALCIUM , CREATININE in the last 72 hours.  Invalid input(s): CO3   No results found for this or any previous visit (from the past 24 hours). No results found for this or any previous visit (from the past 240 hours).  Renal Function: No results for input(s): CREATININE in the last 168 hours. Estimated Creatinine Clearance: 53 mL/min (by C-G formula based on SCr of 0.56 mg/dL).  Radiologic Imaging: No results found.  Assessment:  Angela [name] is a 73 y.o. year old F with left ureteral stone   Plan:  --to OR as planned for left ureteroscopy with laser litho, stent. Procedure and risks reviewed, including but not limited to hematuria, infection, sepsis, damage to GU tract, failure to complete procedure, retained stone fragments, need for future procedures, stent pain, prolonged stent.   Herlene Foot, MD 09/26/2023, 7:27 AM  [facility] Pager: [phone]

## 2023-09-27 ENCOUNTER — Encounter (HOSPITAL_COMMUNITY): Payer: Self-pay | Admitting: Urology

## 2023-09-27 ENCOUNTER — Other Ambulatory Visit: Payer: Self-pay

## 2023-09-27 NOTE — Transitions of Care (Post Inpatient/ED Visit) (Signed)
 Transition of Care week 4  Visit Note  09/27/2023  Name: Angela [name] MRN: [id number]          DOB: 06-20-1950  Situation: Patient enrolled in [facility] 30-day program. Visit completed with pateint by telephone.   Background:   Initial Transition Care Management Follow-up Telephone Call    Past Medical History:  Diagnosis Date   Arthritis    Blood in stool    greater than 4 years since last incidence of blood in stool    Colon polyps    Glaucoma    Hydroureteronephrosis 07/2023   Hyperlipidemia    Hypertension    Inclusion cyst 2021   face   Perforation of sigmoid colon (HCC) 07/2023   Colitis   PNA (pneumonia) 2006   Sepsis (HCC) 07/2023   Strain of left wrist 04/16/2019    Assessment: S/P kidney stone extraction and new stent on 09/26/2023.  Reports she still have dysuria and frequency. Pending stent removal on 09/29/2023.  Taking medications as prescribed. Home health still active and assisting with midline abdominal dressing changes.  Rectal drain still present with no drainage.  Patient Reported Symptoms: Cognitive Cognitive Status: Able to follow simple commands, Alert and oriented to person, place, and time, Normal speech and language skills      Neurological Neurological Review of Symptoms: No symptoms reported    HEENT HEENT Symptoms Reported: No symptoms reported      Cardiovascular Cardiovascular Symptoms Reported: No symptoms reported    Respiratory Respiratory Symptoms Reported: No symptoms reported    Endocrine Endocrine Symptoms Reported: No symptoms reported Is patient diabetic?: No    Gastrointestinal Gastrointestinal Symptoms Reported: Other Other Gastrointestinal Symptoms: stool- brown and pasty through the ostomy. taking miralax  daily.  No drainage from rectal drain. Husband continues to do dressing changes twice a day to midline incision. Paitent reports this is healing. Gastrointestinal Management Strategies: Colostomy, Incontinence garment/pad,  Medication therapy    Genitourinary Genitourinary Symptoms Reported: Frequency, Other Other Genitourinary Symptoms: patient reports that she continues to have frequency but not as bad as it has been. Continues to wear diapers. discolored urine related to AZO.  Continues to have dysuria. Stent to be removed on Friday by home health RN. ( patient states she can not take it out herself) Additional Genitourinary Details: Back on antibiotics. Genitourinary Management Strategies: Medication therapy, Medical device, Incontinence garment/pad Genitourinary Self-Management Outcome: 4 (good)  Integumentary Integumentary Symptoms Reported: Incision Additional Integumentary Details: Mid line abdominal incision healing per patient report.  Drain to rectum still continues to not drain and be painful. Skin Management Strategies: Medical device Skin Self-Management Outcome: 4 (good)  Musculoskeletal Musculoskelatal Symptoms Reviewed: No symptoms reported        Psychosocial Psychosocial Symptoms Reported: No symptoms reported         There were no vitals filed for this visit.  Medications Reviewed Today     Reviewed by Rumalda Alan PENNER, RN (Registered Nurse) on 09/27/23 at 1531  Med List Status: <None>   Medication Order Taking? Sig Documenting Provider Last Dose Status Informant  acetaminophen  (TYLENOL ) 500 MG tablet [id number] Yes Take 2 tablets (1,000 mg total) by mouth every 6 (six) hours as needed. Perri DELENA Meliton Mickey., MD  Active Self  albuterol  (VENTOLIN  HFA) [phone] Base) MCG/ACT inhaler [id number] Yes Inhale 2 puffs into the lungs every 6 (six) hours as needed for wheezing or shortness of breath. Kuneff, Renee A, DO  Active Self  carvedilol  (COREG ) 6.25 MG tablet [id number]  Yes Take 1 tablet (6.25 mg total) by mouth 2 (two) times daily with a meal. Kuneff, Renee A, DO  Active Self  celecoxib (CELEBREX) 100 MG capsule [id number] Yes Take 100 mg by mouth 2 (two) times daily. [provider]   Active   ferrous sulfate  325 (65 FE) MG tablet [id number] Yes Take 1 tablet (325 mg total) by mouth every other day. Follow your iron and anemia with your PCP outpatient Perri DELENA Meliton Mickey., MD  Active Self  Hyoscyamine  Sulfate SL (LEVSIN AMIEL) 0.125 MG SUBL [id number] Yes Place 1 tablet (0.125 mg total) under the tongue every 6 (six) hours as needed (bladder spasms, stent discomfort). Lovie Arlyss CROME, MD  Active   latanoprost  (XALATAN ) 0.005 % ophthalmic solution [id number] Yes Place 1 drop into both eyes at bedtime. [provider]  Active Self  lisinopril  (ZESTRIL ) 10 MG tablet [id number] Yes Take 1 tablet (10 mg total) by mouth daily. Kuneff, Renee A, DO  Active Self  methocarbamol  (ROBAXIN ) 500 MG tablet [id number] Yes Take 1 tablet (500 mg total) by mouth 2 (two) times daily as needed for muscle spasms. Catherine, Renee A, DO  Active Self  Multiple Vitamin (MULTIVITAMIN) tablet [id number] Yes Take 1 tablet by mouth daily. [provider]  Active Self  nitrofurantoin , macrocrystal-monohydrate, (MACROBID ) 100 MG capsule [id number] Yes Take 1 capsule (100 mg total) by mouth 2 (two) times daily for 5 days. Lovie Arlyss CROME, MD  Active   ondansetron  (ZOFRAN -ODT) 4 MG disintegrating tablet [id number] Yes Take 1 tablet (4 mg total) by mouth every 8 (eight) hours as needed for nausea or vomiting. Kuneff, Renee A, DO  Active Self  pantoprazole  (PROTONIX ) 40 MG tablet [id number] Yes Take 1 tablet (40 mg total) by mouth daily. Kuneff, Renee A, DO  Active Self  phenazopyridine (PYRIDIUM) 97 MG tablet [id number] Yes Take 97 mg by mouth 3 (three) times daily. [provider]  Active   polyethylene glycol powder (GLYCOLAX /MIRALAX ) 17 GM/SCOOP powder [id number] Yes Take 17 g by mouth daily. Perri DELENA Meliton Mickey., MD  Active Self  Probiotic Product (DAILY PROBIOTIC PO) [id number] Yes Take 1 capsule by mouth daily at 12 noon. [provider]  Active Self  timolol  (TIMOPTIC ) 0.5 % ophthalmic  solution [id number] Yes Place 1 drop into both eyes daily. [provider]  Active Self  traMADol  (ULTRAM ) 50 MG tablet [id number] Yes Take 1 tablet (50 mg total) by mouth every 6 (six) hours as needed for up to 3 days. Lovie Arlyss CROME, MD  Active             Recommendation:   Continue Current Plan of Care  Follow Up Plan:   Telephone follow up appointment date/time:  10/04/2023  at 330pm  Alan Ee, RN, BSN, CEN [facility]- Transition of Care Team.  [facility] [phone]

## 2023-09-27 NOTE — Patient Instructions (Signed)
 Visit Information  Thank you for taking time to visit with me today. Please don't hesitate to contact me if I can be of assistance to you before our next scheduled telephone appointment.  Our next appointment is by telephone on 10/04/2023 at 330 pm  Following is a copy of your care plan:   Goals Addressed             This Visit's Progress    [facility] Transitions of Care (TOC) Care Plan       Problems:  Recent Hospitalization for treatment of bowel perforation 09/12/2023  Reports ostomy bag and dressing changes are doing well. 09/19/2023  Seen at the ostomy clinic and reports ostomy care is going well. Patient is having pasty brown stool. States it was suggested that she increase her miralax . Patient was seen by IR and drain was place to bag by gravity.  Patient reports the bag is not working well.  ( Reports the bag is huge-642ml).  Patient reports the bag is pulling and causing a lot of pain. Patient reports very little drain. States that drainage now looks like it has sediment in it.  Patient is going to call IT today about the pain and need to do something different. Patient scheduled to see IR on 8/13/205 at 745 am to evaluation of drain and site. 09/25/2023 Patient report that abdominal incision if healing well. Husband continues to do dressing changes.  Continues to have rectal drain with no drainage.  09/27/2023  Reports midline abdominal incision healing. Husband continues to do dressing changes twice a day.  Home health nurse coming on 09/29/2023 to remove stent.  Rectal drain without any drainage since IR visit last week.  Patient reports pasty stool in ostomy bag and taking her miralax  daily.  Home Health services barrier: confirmed that home health will start on 09/06/2023 09/12/2023  home health nurse coming today. 09/19/2023  Home health nurse came today, nurse reports mid abdominal incision site is healing well but slow.  Patient has increased her protein intake. 09/25/2023  Patient continues  to have home health nursing for dressing changes and patient reports next visit is planned for 09/29/2023      09/27/2023 active with home health UTI- per husband 1 and a half plus gallons of urine in 24 hours. Patient unable to control urination. Wearing diapers and is incontinent. Last antibiotics tonight.  Positive urine culture. 09/12/2023 Saw PCP and still has a UTI. Continues to have urinary frequency. Antibiotics changed yesterday. 09/19/2023 Patient reports recent change of antibiotic. States she has 3 days left.  States she continues to have frequency ( 6 times per hour) and dysuria.  Dysuria pain 7/10.  Patient is pending pre op tomorrow for kidney stone extraction on 09/26/2023. Denies fever but reports poor energy levels. Message back from PCP discuss with urology urgent and patient needs to see ID. Scheduled with ID on 10/03/2023- ( needs a sooner appointment)  09/25/2023  Urine culture came back negative.  Patient continues to have dysuria and frequency. Reports pyridum and celebrex has helped some.  Still voiding every 10-15 minutes and is now wearing diapers to be able to get some sleep. 09/27/2023  Kidney stone surgery yesterday wife a new stent placed.  Patient reports decrease in urination and decrease in pain. Restarted on antibiotics, Levsin ,  Taking all other medications as prescribed. Has only taken 1 ultram . Goal:  Over the next 30 days, the patient will not experience hospital readmission  Interventions:  Transitions of  Care:  Doctor Visits  - discussed the importance of doctor visits Encouraged patient to drink a lot of fluid. Reviewed energy levels and need for rest Confirmed dressing changes and ostomy care is going well. Reviewed pending appointments- Urology and IR- also discussed questions about infectious disease appointment. Encouraged patient to message PCP in my chart. ( As I am unable to advise about this appointment) Encouraged patient to eat well with increased  protein Reviewed output of ostomy and drain.  Reviewed no drainage from rectal drain.  Surgery (Perforated bowel, kidney stent): Reviewed that home health is still active with home visits.  Reviewed pain   Patient self care activities:  Attend all scheduled provider appointments Call pharmacy for medication refills 3-7 days in advance of running out of medications Call provider office for new concerns or questions  Notify RN Care Manager of TOC call rescheduling needs Participate in Transition of Care Program/Attend TOC scheduled calls Take medications as prescribed   Stay hydrated Husband to continue to do dressing changes. Call home health for any new concerns. Call MD or seek emergency care if needed. Take AZO per recommendation from urology Continue to stay hydrated.  Send PCP a my chart message about questions Take pain medications as needed.   Plan:  Telephone follow up appointment with care management team member scheduled for:  10/04/2023 at 3:30pm The patient has been provided with contact information for the care management team and has been advised to call with any health related questions or concerns.         Patient verbalizes understanding of instructions and care plan provided today and agrees to view in MyChart. Active MyChart status and patient understanding of how to access instructions and care plan via MyChart confirmed with patient.     Telephone follow up appointment with care management team member scheduled for: 10/04/2023  Please call the care guide team at [phone] if you need to cancel or reschedule your appointment.   Please call the Suicide and Crisis Lifeline: 988 call the [place]  National Suicide Prevention Lifeline: (804)561-3399 or TTY: 858-874-0445 TTY 806-409-5441) to talk to a trained counselor call 1-800-273-TALK (toll free, 24 hour hotline) call 911 if you are experiencing a Mental Health or Behavioral Health Crisis or need someone to  talk to.  Alan Ee, RN, BSN, CEN [employer]- Transition of Care Team.  [facility] [phone]

## 2023-09-28 DIAGNOSIS — I119 Hypertensive heart disease without heart failure: Secondary | ICD-10-CM | POA: Diagnosis not present

## 2023-09-28 DIAGNOSIS — K9189 Other postprocedural complications and disorders of digestive system: Secondary | ICD-10-CM | POA: Diagnosis not present

## 2023-09-28 DIAGNOSIS — D649 Anemia, unspecified: Secondary | ICD-10-CM | POA: Diagnosis not present

## 2023-09-28 DIAGNOSIS — N739 Female pelvic inflammatory disease, unspecified: Secondary | ICD-10-CM | POA: Diagnosis not present

## 2023-09-28 DIAGNOSIS — I2510 Atherosclerotic heart disease of native coronary artery without angina pectoris: Secondary | ICD-10-CM | POA: Diagnosis not present

## 2023-09-28 DIAGNOSIS — N136 Pyonephrosis: Secondary | ICD-10-CM | POA: Diagnosis not present

## 2023-09-28 DIAGNOSIS — E872 Acidosis, unspecified: Secondary | ICD-10-CM | POA: Diagnosis not present

## 2023-09-28 DIAGNOSIS — T8143XD Infection following a procedure, organ and space surgical site, subsequent encounter: Secondary | ICD-10-CM | POA: Diagnosis not present

## 2023-09-28 DIAGNOSIS — T85528D Displacement of other gastrointestinal prosthetic devices, implants and grafts, subsequent encounter: Secondary | ICD-10-CM | POA: Diagnosis not present

## 2023-09-28 NOTE — Anesthesia Postprocedure Evaluation (Signed)
 Anesthesia Post Note  Patient: Angela [name]  Procedure(s) Performed: CYSTOSCOPY/URETEROSCOPY/HOLMIUM LASER/STENT PLACEMENT (Left)     Patient location during evaluation: PACU Anesthesia Type: General Level of consciousness: awake and patient cooperative Pain management: pain level controlled Vital Signs Assessment: post-procedure vital signs reviewed and stable Respiratory status: spontaneous breathing, nonlabored ventilation, respiratory function stable and patient connected to nasal cannula oxygen Cardiovascular status: blood pressure returned to baseline and stable Postop Assessment: no apparent nausea or vomiting Anesthetic complications: no   No notable events documented.               Jolleen Seman

## 2023-09-29 ENCOUNTER — Other Ambulatory Visit

## 2023-09-29 DIAGNOSIS — I2510 Atherosclerotic heart disease of native coronary artery without angina pectoris: Secondary | ICD-10-CM | POA: Diagnosis not present

## 2023-09-29 DIAGNOSIS — T8143XD Infection following a procedure, organ and space surgical site, subsequent encounter: Secondary | ICD-10-CM | POA: Diagnosis not present

## 2023-09-29 DIAGNOSIS — D649 Anemia, unspecified: Secondary | ICD-10-CM | POA: Diagnosis not present

## 2023-09-29 DIAGNOSIS — N739 Female pelvic inflammatory disease, unspecified: Secondary | ICD-10-CM | POA: Diagnosis not present

## 2023-09-29 DIAGNOSIS — E872 Acidosis, unspecified: Secondary | ICD-10-CM | POA: Diagnosis not present

## 2023-09-29 DIAGNOSIS — N136 Pyonephrosis: Secondary | ICD-10-CM | POA: Diagnosis not present

## 2023-09-29 DIAGNOSIS — I119 Hypertensive heart disease without heart failure: Secondary | ICD-10-CM | POA: Diagnosis not present

## 2023-09-29 DIAGNOSIS — T85528D Displacement of other gastrointestinal prosthetic devices, implants and grafts, subsequent encounter: Secondary | ICD-10-CM | POA: Diagnosis not present

## 2023-09-29 DIAGNOSIS — K9189 Other postprocedural complications and disorders of digestive system: Secondary | ICD-10-CM | POA: Diagnosis not present

## 2023-09-30 ENCOUNTER — Telehealth: Admitting: Nurse Practitioner

## 2023-09-30 DIAGNOSIS — J019 Acute sinusitis, unspecified: Secondary | ICD-10-CM | POA: Diagnosis not present

## 2023-09-30 DIAGNOSIS — B9689 Other specified bacterial agents as the cause of diseases classified elsewhere: Secondary | ICD-10-CM

## 2023-09-30 MED ORDER — IPRATROPIUM BROMIDE 0.03 % NA SOLN: 2.0000 | Freq: Two times a day (BID) | NASAL | 0 refills | Status: AC

## 2023-09-30 MED ORDER — AMOXICILLIN-POT CLAVULANATE 875-125 MG PO TABS: 1.0000 | ORAL_TABLET | Freq: Two times a day (BID) | ORAL | 0 refills | Status: AC

## 2023-09-30 NOTE — Progress Notes (Signed)
 Virtual Visit Consent   Angela [name], you are scheduled for a virtual visit with a [facility] provider today. Just as with appointments in the office, your consent must be obtained to participate. Your consent will be active for this visit and any virtual visit you may have with one of our providers in the next 365 days. If you have a MyChart account, a copy of this consent can be sent to you electronically.  As this is a virtual visit, video technology does not allow for your provider to perform a traditional examination. This may limit your provider's ability to fully assess your condition. If your provider identifies any concerns that need to be evaluated in person or the need to arrange testing (such as labs, EKG, etc.), we will make arrangements to do so. Although advances in technology are sophisticated, we cannot ensure that it will always work on either your end or our end. If the connection with a video visit is poor, the visit may have to be switched to a telephone visit. With either a video or telephone visit, we are not always able to ensure that we have a secure connection.  By engaging in this virtual visit, you consent to the provision of healthcare and authorize for your insurance to be billed (if applicable) for the services provided during this visit. Depending on your insurance coverage, you may receive a charge related to this service.  I need to obtain your verbal consent now. Are you willing to proceed with your visit today? Angela [name] has provided verbal consent on 09/30/2023 for a virtual visit (video or telephone). Angela LELON Servant, NP  Date: 09/30/2023 5:46 PM   Virtual Visit via Video Note   I, Angela [name], connected with  Angela [name]  ([id number], 08/24/1950) on 09/30/23 at  5:15 PM EDT by a video-enabled telemedicine application and verified that I am speaking with the correct person using two identifiers.  Location: Patient: Virtual Visit Location Patient:  Home Provider: Virtual Visit Location Provider: Home Office   I discussed the limitations of evaluation and management by telemedicine and the availability of in person appointments. The patient expressed understanding and agreed to proceed.    History of Present Illness: Angela [name] is a 73 y.o. who identifies as a female who was assigned female at birth, and is being seen today for Sinusitis.  Angela [name] is currently experiencing headache, sinus congestion, sinus pain, sinus drainage and fever with Tmax 101.4. she is currently taking macrobid . She is s/p cystoscopy/ureteroscopy with laser stent placement on 09-26-2023 She has a nurse coming to her home to manage her ostomy and states there are no signs of infection.   Problems:  Patient Active Problem List   Diagnosis Date Noted   Irritant contact dermatitis associated with fecal stoma 09/20/2023   Colostomy care (HCC) 09/20/2023   Vancomycin resistant Enterococcus infection 09/12/2023   Protein-calorie malnutrition, severe 08/14/2023   Hydroureteronephrosis 07/30/2023   Metabolic acidosis with increased anion gap and accumulation of organic acids 07/30/2023   Hyponatremia 07/30/2023   Glaucoma 07/30/2023   Perforated sigmoid colon (HCC) 07/30/2023   Chronic idiopathic constipation 06/25/2020   Hematochezia 06/25/2020   Hyperlipidemia 06/25/2020   Elevated hemoglobin A1c 01/22/2020   Leukocytosis 01/22/2020   Essential hypertension 06/19/2019   Abnormal mammogram 05/15/2017    Allergies:  Allergies  Allergen Reactions   Codeine Other (See Comments)    Irritable , insomnia    Medications:  Current Outpatient  Medications:    amoxicillin -clavulanate (AUGMENTIN ) 875-125 MG tablet, Take 1 tablet by mouth 2 (two) times daily for 7 days. DO NOT TAKE WHILE TAKING NITROFURANTOIN , Disp: 14 tablet, Rfl: 0   ipratropium (ATROVENT ) 0.03 % nasal spray, Place 2 sprays into both nostrils every 12 (twelve) hours., Disp: 30 mL, Rfl: 0    acetaminophen  (TYLENOL ) 500 MG tablet, Take 2 tablets (1,000 mg total) by mouth every 6 (six) hours as needed., Disp: , Rfl:    albuterol  (VENTOLIN  HFA) 108 (90 Base) MCG/ACT inhaler, Inhale 2 puffs into the lungs every 6 (six) hours as needed for wheezing or shortness of breath., Disp: 8 g, Rfl: 0   carvedilol  (COREG ) 6.25 MG tablet, Take 1 tablet (6.25 mg total) by mouth 2 (two) times daily with a meal., Disp: 60 tablet, Rfl: 5   celecoxib (CELEBREX) 100 MG capsule, Take 100 mg by mouth 2 (two) times daily., Disp: , Rfl:    ferrous sulfate  325 (65 FE) MG tablet, Take 1 tablet (325 mg total) by mouth every other day. Follow your iron and anemia with your PCP outpatient, Disp: 15 tablet, Rfl: 0   Hyoscyamine  Sulfate SL (LEVSIN /SL) 0.125 MG SUBL, Place 1 tablet (0.125 mg total) under the tongue every 6 (six) hours as needed (bladder spasms, stent discomfort)., Disp: 20 tablet, Rfl: 1   latanoprost  (XALATAN ) 0.005 % ophthalmic solution, Place 1 drop into both eyes at bedtime., Disp: , Rfl: 4   lisinopril  (ZESTRIL ) 10 MG tablet, Take 1 tablet (10 mg total) by mouth daily., Disp: 90 tablet, Rfl: 1   methocarbamol  (ROBAXIN ) 500 MG tablet, Take 1 tablet (500 mg total) by mouth 2 (two) times daily as needed for muscle spasms., Disp: 60 tablet, Rfl: 1   Multiple Vitamin (MULTIVITAMIN) tablet, Take 1 tablet by mouth daily., Disp: , Rfl:    nitrofurantoin , macrocrystal-monohydrate, (MACROBID ) 100 MG capsule, Take 1 capsule (100 mg total) by mouth 2 (two) times daily for 5 days., Disp: 10 capsule, Rfl: 0   ondansetron  (ZOFRAN -ODT) 4 MG disintegrating tablet, Take 1 tablet (4 mg total) by mouth every 8 (eight) hours as needed for nausea or vomiting., Disp: 20 tablet, Rfl: 0   pantoprazole  (PROTONIX ) 40 MG tablet, Take 1 tablet (40 mg total) by mouth daily., Disp: 90 tablet, Rfl: 1   phenazopyridine (PYRIDIUM) 97 MG tablet, Take 97 mg by mouth 3 (three) times daily., Disp: , Rfl:    polyethylene glycol powder  (GLYCOLAX /MIRALAX ) 17 GM/SCOOP powder, Take 17 g by mouth daily., Disp: 238 g, Rfl: 0   Probiotic Product (DAILY PROBIOTIC PO), Take 1 capsule by mouth daily at 12 noon., Disp: , Rfl:    timolol  (TIMOPTIC ) 0.5 % ophthalmic solution, Place 1 drop into both eyes daily., Disp: , Rfl:   Observations/Objective: Patient is well-developed, well-nourished in no acute distress.  Resting comfortably at home.  Head is normocephalic, atraumatic.  No labored breathing.  Speech is clear and coherent with logical content.  Patient is alert and oriented at baseline.    Assessment and Plan: 1. Acute bacterial sinusitis (Primary) - amoxicillin -clavulanate (AUGMENTIN ) 875-125 MG tablet; Take 1 tablet by mouth 2 (two) times daily for 7 days. DO NOT TAKE WHILE TAKING NITROFURANTOIN   Dispense: 14 tablet; Refill: 0 - ipratropium (ATROVENT ) 0.03 % nasal spray; Place 2 sprays into both nostrils every 12 (twelve) hours.  Dispense: 30 mL; Refill: 0   She was instructed to try the nasal spray first and then if no improvement to start augmentin  but only  after she has completed macrobid .   Follow Up Instructions: I discussed the assessment and treatment plan with the patient. The patient was provided an opportunity to ask questions and all were answered. The patient agreed with the plan and demonstrated an understanding of the instructions.  A copy of instructions were sent to the patient via MyChart unless otherwise noted below.    The patient was advised to call back or seek an in-person evaluation if the symptoms worsen or if the condition fails to improve as anticipated.    Ilian Wessell W Jennings Corado, NP

## 2023-09-30 NOTE — Patient Instructions (Signed)
 Arland GORMAN March, thank you for joining Haze LELON Servant, NP for today's virtual visit.  While this provider is not your primary care provider (PCP), if your PCP is located in our provider database this encounter information will be shared with them immediately following your visit.   A [place] MyChart account gives you access to today's visit and all your visits, tests, and labs performed at [facility]  click here if you don't have a [place] MyChart account or go to [email or web address]  Consent: (Patient) Angela [name] provided verbal consent for this virtual visit at the beginning of the encounter.  Current Medications:  Current Outpatient Medications:    amoxicillin -clavulanate (AUGMENTIN ) 875-125 MG tablet, Take 1 tablet by mouth 2 (two) times daily for 7 days. DO NOT TAKE WHILE TAKING NITROFURANTOIN , Disp: 14 tablet, Rfl: 0   ipratropium (ATROVENT ) 0.03 % nasal spray, Place 2 sprays into both nostrils every 12 (twelve) hours., Disp: 30 mL, Rfl: 0   acetaminophen  (TYLENOL ) 500 MG tablet, Take 2 tablets (1,000 mg total) by mouth every 6 (six) hours as needed., Disp: , Rfl:    albuterol  (VENTOLIN  HFA) 108 (90 Base) MCG/ACT inhaler, Inhale 2 puffs into the lungs every 6 (six) hours as needed for wheezing or shortness of breath., Disp: 8 g, Rfl: 0   carvedilol  (COREG ) 6.25 MG tablet, Take 1 tablet (6.25 mg total) by mouth 2 (two) times daily with a meal., Disp: 60 tablet, Rfl: 5   celecoxib (CELEBREX) 100 MG capsule, Take 100 mg by mouth 2 (two) times daily., Disp: , Rfl:    ferrous sulfate  325 (65 FE) MG tablet, Take 1 tablet (325 mg total) by mouth every other day. Follow your iron and anemia with your PCP outpatient, Disp: 15 tablet, Rfl: 0   Hyoscyamine  Sulfate SL (LEVSIN /SL) 0.125 MG SUBL, Place 1 tablet (0.125 mg total) under the tongue every 6 (six) hours as needed (bladder spasms, stent discomfort)., Disp: 20 tablet, Rfl: 1   latanoprost  (XALATAN ) 0.005 %  ophthalmic solution, Place 1 drop into both eyes at bedtime., Disp: , Rfl: 4   lisinopril  (ZESTRIL ) 10 MG tablet, Take 1 tablet (10 mg total) by mouth daily., Disp: 90 tablet, Rfl: 1   methocarbamol  (ROBAXIN ) 500 MG tablet, Take 1 tablet (500 mg total) by mouth 2 (two) times daily as needed for muscle spasms., Disp: 60 tablet, Rfl: 1   Multiple Vitamin (MULTIVITAMIN) tablet, Take 1 tablet by mouth daily., Disp: , Rfl:    nitrofurantoin , macrocrystal-monohydrate, (MACROBID ) 100 MG capsule, Take 1 capsule (100 mg total) by mouth 2 (two) times daily for 5 days., Disp: 10 capsule, Rfl: 0   ondansetron  (ZOFRAN -ODT) 4 MG disintegrating tablet, Take 1 tablet (4 mg total) by mouth every 8 (eight) hours as needed for nausea or vomiting., Disp: 20 tablet, Rfl: 0   pantoprazole  (PROTONIX ) 40 MG tablet, Take 1 tablet (40 mg total) by mouth daily., Disp: 90 tablet, Rfl: 1   phenazopyridine (PYRIDIUM) 97 MG tablet, Take 97 mg by mouth 3 (three) times daily., Disp: , Rfl:    polyethylene glycol powder (GLYCOLAX /MIRALAX ) 17 GM/SCOOP powder, Take 17 g by mouth daily., Disp: 238 g, Rfl: 0   Probiotic Product (DAILY PROBIOTIC PO), Take 1 capsule by mouth daily at 12 noon., Disp: , Rfl:    timolol  (TIMOPTIC ) 0.5 % ophthalmic solution, Place 1 drop into both eyes daily., Disp: , Rfl:    Medications ordered in this encounter:  Meds ordered this encounter  Medications  amoxicillin -clavulanate (AUGMENTIN ) 875-125 MG tablet    Sig: Take 1 tablet by mouth 2 (two) times daily for 7 days. DO NOT TAKE WHILE TAKING NITROFURANTOIN     Dispense:  14 tablet    Refill:  0    Supervising Provider:   LAMPTEY, PHILIP O [[id number]]   ipratropium (ATROVENT ) 0.03 % nasal spray    Sig: Place 2 sprays into both nostrils every 12 (twelve) hours.    Dispense:  30 mL    Refill:  0    Supervising Provider:   BLAISE ALEENE KIDD [[id number]]     *If you need refills on other medications prior to your next appointment, please contact your  pharmacy*  Follow-Up: Call back or seek an in-person evaluation if the symptoms worsen or if the condition fails to improve as anticipated.  [facility] [phone]  Other Instructions She was instructed to try the nasal spray first and then if no improvement to start augmentin  but only after she has completed macrobid .    If you have been instructed to have an in-person evaluation today at a local Urgent Care facility, please use the link below. It will take you to a list of all of our available [facility], including address, phone number and hours of operation. Please do not delay care.  [facility]  If you or a family member do not have a primary care provider, use the link below to schedule a visit and establish care. When you choose a [facility] primary care physician or advanced practice provider, you gain a long-term partner in health. Find a Primary Care Provider  Learn more about [facility] in-office and virtual care options: [place] - Get Care Now

## 2023-10-03 ENCOUNTER — Encounter: Payer: Self-pay | Admitting: Internal Medicine

## 2023-10-03 ENCOUNTER — Ambulatory Visit: Admitting: Internal Medicine

## 2023-10-03 ENCOUNTER — Other Ambulatory Visit: Payer: Self-pay

## 2023-10-03 ENCOUNTER — Telehealth: Payer: Self-pay

## 2023-10-03 VITALS — BP 137/81 | HR 66 | Temp 98.3°F | Wt 118.0 lb

## 2023-10-03 DIAGNOSIS — Z933 Colostomy status: Secondary | ICD-10-CM | POA: Diagnosis not present

## 2023-10-03 DIAGNOSIS — N390 Urinary tract infection, site not specified: Secondary | ICD-10-CM

## 2023-10-03 DIAGNOSIS — K5732 Diverticulitis of large intestine without perforation or abscess without bleeding: Secondary | ICD-10-CM | POA: Diagnosis not present

## 2023-10-03 NOTE — Telephone Encounter (Signed)
 Forms faxed

## 2023-10-03 NOTE — Progress Notes (Incomplete)
 Chief Complaint: Patient was seen in consultation today for No chief complaint on file.  at the request of Han,Aimee H  Referring Physician(s): Han,Aimee H  Supervising Physician: {Supervising Physician:[id number]}  History of Present Illness: Angela [name] is a 73 y.o. female ***  Past Medical History:  Diagnosis Date   Arthritis    Blood in stool    greater than 4 years since last incidence of blood in stool    Colon polyps    Glaucoma    Hydroureteronephrosis 07/2023   Hyperlipidemia    Hypertension    Inclusion cyst 2021   face   Perforation of sigmoid colon (HCC) 07/2023   Colitis   PNA (pneumonia) 2006   Sepsis (HCC) 07/2023   Strain of left wrist 04/16/2019    Past Surgical History:  Procedure Laterality Date   ABSCESS DRAINAGE  2021   face- inclusion cyst   BREAST BIOPSY Left 07/19/2018   US  guided breast bx with clip placement- benign   COLON RESECTION SIGMOID  07/30/2023   Procedure: COLECTOMY, SIGMOID, OPEN;  Surgeon: Polly Cordella LABOR, MD;  Location: [facility];  Service: General;;   CYSTOSCOPY W/ URETERAL STENT PLACEMENT Left 08/18/2023   Procedure: CYSTOSCOPY, WITH RETROGRADE PYELOGRAM AND URETERAL STENT INSERTION;  Surgeon: Lovie Arlyss CROME, MD;  Location: [facility];  Service: Urology;  Laterality: Left;   CYSTOSCOPY/URETEROSCOPY/HOLMIUM LASER/STENT PLACEMENT Left 09/26/2023   Procedure: CYSTOSCOPY/URETEROSCOPY/HOLMIUM LASER/STENT PLACEMENT;  Surgeon: Lovie Arlyss CROME, MD;  Location: [facility];  Service: Urology;  Laterality: Left;   EYE SURGERY  1956   HEMORRHOID SURGERY  2001   IR CATHETER TUBE CHANGE  08/30/2023   IR CV LINE INJECTION  08/24/2023   IR RADIOLOGIST EVAL & MGMT  09/15/2023   IR RADIOLOGIST EVAL & MGMT  09/20/2023   LAPAROTOMY N/A 07/30/2023   Procedure: LAPAROTOMY, EXPLORATORY;  Surgeon: Polly Cordella LABOR, MD;  Location: [facility];  Service: General;  Laterality: N/A;   RADIOACTIVE SEED GUIDED EXCISIONAL BREAST BIOPSY Left 03/15/2019   Procedure:  RADIOACTIVE SEED GUIDED EXCISIONAL LEFT  BREAST BIOPSY;  Surgeon: Gladis Cough, MD;  Location: [facility];  Service: General;  Laterality: Left;   TONSILLECTOMY  1963   WISDOM TOOTH EXTRACTION      Allergies: Codeine  Medications: Prior to Admission medications   Medication Sig Start Date End Date Taking? Authorizing Provider  acetaminophen  (TYLENOL ) 500 MG tablet Take 2 tablets (1,000 mg total) by mouth every 6 (six) hours as needed. 09/04/23   Perri LABOR Meliton Mickey., MD  albuterol  (VENTOLIN  HFA) 108 [phone] Base) MCG/ACT inhaler Inhale 2 puffs into the lungs every 6 (six) hours as needed for wheezing or shortness of breath. Patient not taking: Reported on 10/03/2023 03/03/22   Catherine Fuller A, DO  amoxicillin -clavulanate (AUGMENTIN ) 875-125 MG tablet Take 1 tablet by mouth 2 (two) times daily for 7 days. DO NOT TAKE WHILE TAKING NITROFURANTOIN  Patient not taking: Reported on 10/03/2023 09/30/23 10/07/23  Fleming, Zelda W, NP  carvedilol  (COREG ) 6.25 MG tablet Take 1 tablet (6.25 mg total) by mouth 2 (two) times daily with a meal. 09/06/23   Kuneff, Renee A, DO  celecoxib (CELEBREX) 100 MG capsule Take 100 mg by mouth 2 (two) times daily.    [provider]  ferrous sulfate  325 (65 FE) MG tablet Take 1 tablet (325 mg total) by mouth every other day. Follow your iron and anemia with your PCP outpatient 09/04/23 10/04/23  Perri LABOR Meliton Mickey., MD  Hyoscyamine   Sulfate SL (LEVSIN /SL) 0.125 MG SUBL Place 1 tablet (0.125 mg total) under the tongue every 6 (six) hours as needed (bladder spasms, stent discomfort). 09/26/23   Lovie Arlyss CROME, MD  ipratropium (ATROVENT ) 0.03 % nasal spray Place 2 sprays into both nostrils every 12 (twelve) hours. Patient not taking: Reported on 10/03/2023 09/30/23   Fleming, Zelda W, NP  latanoprost  (XALATAN ) 0.005 % ophthalmic solution Place 1 drop into both eyes at bedtime. 08/11/17   [provider]  lisinopril  (ZESTRIL ) 10 MG tablet Take 1  tablet (10 mg total) by mouth daily. 09/06/23   Kuneff, Renee A, DO  methocarbamol  (ROBAXIN ) 500 MG tablet Take 1 tablet (500 mg total) by mouth 2 (two) times daily as needed for muscle spasms. 09/06/23   Kuneff, Renee A, DO  Multiple Vitamin (MULTIVITAMIN) tablet Take 1 tablet by mouth daily.    [provider]  ondansetron  (ZOFRAN -ODT) 4 MG disintegrating tablet Take 1 tablet (4 mg total) by mouth every 8 (eight) hours as needed for nausea or vomiting. 09/06/23   Kuneff, Renee A, DO  pantoprazole  (PROTONIX ) 40 MG tablet Take 1 tablet (40 mg total) by mouth daily. 09/06/23   Kuneff, Renee A, DO  phenazopyridine (PYRIDIUM) 97 MG tablet Take 97 mg by mouth 3 (three) times daily.    [provider]  polyethylene glycol powder (GLYCOLAX /MIRALAX ) 17 GM/SCOOP powder Take 17 g by mouth daily. 09/04/23   Perri DELENA Meliton Mickey., MD  Probiotic Product (DAILY PROBIOTIC PO) Take 1 capsule by mouth daily at 12 noon. 09/19/23   [provider]  timolol  (TIMOPTIC ) 0.5 % ophthalmic solution Place 1 drop into both eyes daily. 11/14/19   [provider]     Family History  Problem Relation Age of Onset   Heart attack Father    Heart attack Sister    Hyperlipidemia Sister    Hypertension Sister     Social History   Socioeconomic History   Marital status: Married    Spouse name: Not on file   Number of children: Not on file   Years of education: Not on file   Highest education level: Bachelor's degree (e.g., BA, AB, BS)  Occupational History   Not on file  Tobacco Use   Smoking status: Former    Current packs/day: 0.00    Types: Cigars, Cigarettes    Passive exposure: Past   Smokeless tobacco: Never   Tobacco comments:    *occasional smoker   Vaping Use   Vaping status: Former  Substance and Sexual Activity   Alcohol use: Not Currently    Alcohol/week: 4.0 standard drinks of alcohol    Types: 4 Glasses of wine per week    Comment: *rarely    Drug use: No    Sexual activity: Yes    Partners: Male    Comment: Married  Other Topics Concern   Not on file  Social History Narrative   Married. Retired. 2 children.   Bachelors degree.   Exercises routinely.   Drinks caffeine.   Smoke alarm in the home. Wears her seatbelt. Owns firearms.   Feels safe in her relationships.   Social Drivers of Health   Financial Resource Strain: Patient Declined (04/19/2023)   Overall Financial Resource Strain (CARDIA)    Difficulty of Paying Living Expenses: Patient declined  Food Insecurity: Unknown (09/05/2023)   Hunger Vital Sign    Worried About Running Out of Food in the Last Year: Never true    Ran Out of  Food in the Last Year: Not on file  Transportation Needs: No Transportation Needs (09/05/2023)   PRAPARE - Administrator, Civil Service (Medical): No    Lack of Transportation (Non-Medical): No  Physical Activity: Sufficiently Active (04/19/2023)   Exercise Vital Sign    Days of Exercise per Week: 5 days    Minutes of Exercise per Session: 30 min  Stress: No Stress Concern Present (04/19/2023)   [employer] of Occupational Health - Occupational Stress Questionnaire    Feeling of Stress : Not at all  Social Connections: Patient Declined (08/02/2023)   Social Connection and Isolation Panel    Frequency of Communication with Friends and Family: Patient declined    Frequency of Social Gatherings with Friends and Family: Patient declined    Attends Religious Services: Patient declined    Database administrator or Organizations: Patient declined    Attends Banker Meetings: Patient declined    Marital Status: Patient declined    ECOG Status: {CHL ONC ECOG ED:[id number]}  Review of Systems: A 12 point ROS discussed and pertinent positives are indicated in the HPI above.  All other systems are negative.  Review of Systems  Vital Signs: There were no vitals taken for this visit.  Physical Exam  Mallampati Score:      Imaging: DG C-Arm 1-60 Min-No Report Result Date: 09/26/2023 Fluoroscopy was utilized by the requesting physician.  No radiographic interpretation.   DG Sinus/Fist Tube Chk-Non GI Result Date: 09/20/2023 INDICATION: Trans gluteal pelvic abscess drain, localized pain EXAM: FLUOROSCOPIC INJECTION OF THE TRANS GLUTEAL ABSCESS DRAIN MEDICATIONS: NONE. ANESTHESIA/SEDATION: None. COMPLICATIONS: None immediate. PROCEDURE: Informed written consent was obtained from the patient after a thorough discussion of the procedural risks, benefits and alternatives. All questions were addressed. Maximal Sterile Barrier Technique was utilized including caps, mask, sterile gowns, sterile gloves, sterile drape, hand hygiene and skin antiseptic. A timeout was performed prior to the initiation of the procedure. Under fluoroscopy, the existing abscess drain was injected with contrast. Stable drain catheter position. Similar collapsed abscess cavity. 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 No interval change compared to 09/15/2023. IMPRESSION: Stable trans gluteal abscess drain with a persistent patent fistula to the rectum. PLAN: Drain catheter to remain. No further flushing. Abscess drain remains to gravity drainage. Repeat injection in 2 weeks. Electronically Signed   By: CHRISTELLA.  Shick M.D.   On: 09/20/2023 08:46   IR Radiologist Eval & Mgmt Result Date: 09/20/2023 : Please see the dictated noted in [facility] from today for this IR rad eval and management. Electronically Signed   By: CHRISTELLA.  Shick M.D.   On: 09/20/2023 08:38   CT ABDOMEN PELVIS W CONTRAST Result Date: 09/15/2023 CLINICAL DATA:  73 year old female with history Hartmann's procedure complicated postoperatively pelvic abscess formation status post percutaneous drain placement 08/14/2023, exchanged on 08/30/2023. EXAM: CT ABDOMEN AND PELVIS WITH CONTRAST TECHNIQUE: Multidetector CT imaging of the abdomen and pelvis was performed using the standard protocol following  bolus administration of intravenous contrast. RADIATION DOSE REDUCTION: This exam was performed according to the departmental dose-optimization program which includes automated exposure control, adjustment of the mA and/or kV according to patient size and/or use of iterative reconstruction technique. CONTRAST:  ISOVUE -300 IOPAMIDOL  (ISOVUE -300) INJECTION 61% COMPARISON:  08/30/2023, 08/29/2018 FINDINGS: Lower chest: No acute abnormality. Hepatobiliary: A few unchanged simple appearing hepatic cysts are again visualized. No new focal liver abnormality is seen. No gallstones, gallbladder wall thickening, or biliary dilatation. Pancreas: Unremarkable. No pancreatic  ductal dilatation or surrounding inflammatory changes. Spleen: Normal in size without focal abnormality. Adrenals/Urinary Tract: Adrenal glands are unremarkable. Similar appearance of a few scattered bilateral simple renal cysts that do not require additional follow-up. Similar appearance of indwelling left double-J nephroureteral stent which appears well positioned. There is persistent mild left hydronephrosis with left extrarenal pelvis. No evidence of significant hydroureter. Kidneys are otherwise normal, without renal calculi, focal lesion, or hydronephrosis. Bladder is unremarkable. Stomach/Bowel: Stomach is within normal limits. Appendix is not definitively identified. Similar appearance of partial left hemicolectomy within ostomy in the left lower quadrant. Residual enteric contrast material is visualized in the rectum. No evidence of bowel wall thickening, distention, or inflammatory changes. Vascular/Lymphatic: Aortic atherosclerosis. No enlarged abdominal or pelvic lymph nodes. Reproductive: Status post hysterectomy. No adnexal masses. Other: Resolution of previously visualized pelvic fluid collection after drain repositioning. The indwelling right transgluteal pigtail drainage catheter is in similar position. Musculoskeletal: Multilevel  degenerative changes of the visualized thoracolumbar spine. No acute osseous abnormality. IMPRESSION: 1. Resolution of previously visualized pelvic fluid collection after drain repositioning. The indwelling right transgluteal pigtail drainage catheter is in similar position. 2. Similar appearance of indwelling left double-J nephroureteral stent which appears well positioned. There is persistent mild left hydronephrosis with left extrarenal pelvis. No evidence of significant hydroureter. 3. Similar appearance of partial left hemicolectomy within ostomy in the left lower quadrant. 4.  Aortic Atherosclerosis (ICD10-I70.0). Ester Sides, MD [facility] Electronically Signed   By: Ester Sides M.D.   On: 09/15/2023 13:32   IR Radiologist Eval & Mgmt Result Date: 09/15/2023 CLINICAL DATA:  73 year old female the history ischemic colitis with perforation status post Hartmann's procedure 07/30/2023. She underwent multiple drain placements and exchanges. Presents for right TG drain eval. EXAM: FLUORO GUIDED RIGHT TRANS GLUTEAL DRAIN INJECTION. COMPARISON:  R TG drain injection performed 08/30/2023 CONTRAST:  8 mL Isovue -300-administered via the existing percutaneous drain. FLUOROSCOPY TIME:  1.9 mGy TECHNIQUE: The patient was positioned prone on the fluoroscopy table. A preprocedural spot fluoroscopic image was obtained of the pelvic area and the existing percutaneous drainage catheter. Fluoroscopic and radiographic images were obtained following the injection of a small amount of contrast via the existing percutaneous drainage catheter. Contrast injection showed fistula to the rectum. Injection was terminated, approximately 2 mL of contrast was aspirated back. The drain was flushed with 5 mL of normal saline. New dressing was placed after the procedure. Small residual contrast seen in the urinary bladder. Patient received IV contrast for CT prior to the drain  injection. 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 Performed by: Aimee Han, PA-C Electronically Signed   By: Ester Sides M.D.   On: 09/15/2023 11:56   DG Sinus/Fist Tube Chk-Non GI Result Date: 09/15/2023 CLINICAL DATA:  73 year old female the history ischemic colitis with perforation status post Hartmann's procedure 07/30/2023. She underwent multiple drain placements and exchanges. Presents for right TG drain eval. EXAM: FLUORO GUIDED RIGHT TRANS GLUTEAL DRAIN INJECTION. COMPARISON:  R TG drain injection performed 08/30/2023 CONTRAST:  8 mL Isovue -300-administered via the existing percutaneous drain. FLUOROSCOPY TIME:  1.9 mGy TECHNIQUE: The patient was positioned prone on the fluoroscopy table. A preprocedural spot fluoroscopic image was obtained of the pelvic area and the existing percutaneous drainage catheter. Fluoroscopic and radiographic images were obtained following the injection of a small amount of contrast via the existing percutaneous drainage catheter. Contrast injection showed fistula to the rectum. Injection was terminated, approximately 2 mL  of contrast was aspirated back. The drain was flushed with 5 mL of normal saline. New dressing was placed after the procedure. Small residual contrast seen in the urinary bladder. Patient received IV contrast for CT prior to the drain injection. 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 Performed by: Aimee Han, PA-C Electronically Signed   By: Ester Sides M.D.   On: 09/15/2023 11:56    Labs:  CBC: Recent Labs    09/01/23 0324 09/02/23 0350 09/04/23 0340 09/06/23 1139  WBC 6.1 5.3 5.9 6.3  HGB 10.3* 9.9* 9.8* 11.1*  HCT 31.9* 31.1* 31.3* 34.3*  PLT 328 305 312 381.0    COAGS: Recent Labs    07/29/23 0826 08/07/23 0909 08/15/23 0431  INR 1.1 1.2 1.1    BMP: Recent Labs    08/31/23 0358 09/01/23 0324 09/02/23 0350 09/04/23 0340 09/06/23 1139  NA  137 137 136 137 137  K 4.1 3.6 3.9 3.4* 4.0  CL 102 103 102 103 99  CO2 26 27 25 27 30   GLUCOSE 108* 102* 94 94 99  BUN 23 20 16 13 17   CALCIUM  8.4* 8.6* 8.4* 8.5* 8.7  CREATININE 0.50 0.55 0.55 0.49 0.56  GFRNONAA >60 >60 >60 >60  --     LIVER FUNCTION TESTS: Recent Labs    08/31/23 0358 09/01/23 0324 09/02/23 0350 09/06/23 1139  BILITOT 0.5 0.6 0.3 0.3  AST 15 14* 14* 19  ALT 11 12 11 21   ALKPHOS 63 70 59 81  PROT 5.2* 5.3* 5.1* 6.0  ALBUMIN  2.0* 2.0* 2.1* 3.4*    TUMOR MARKERS: No results for input(s): AFPTM, CEA, CA199, CHROMGRNA in the last 8760 hours.  Assessment:  ***   Electronically Signed: Carlin LABOR Korbin Mapps PA-C 10/03/2023, 3:50 PM   Please refer to Dr. PIERRETTE attestation of this note for management and plan.

## 2023-10-03 NOTE — Progress Notes (Addendum)
 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 F with past medical history as below including hydronephrosis, hyperlipidemia, colon perforation of sigmoid colon referred by primary care provider for VRE UTI.Patient was seen on 7/30 with PCP noted that she was there for hospital follow-up for sigmoid perforation and fluctuant peritonitis in the setting of colitis underwent sigmoidectomy and end colostomy on 07/30/2023.  IR drain was placed on 08/02/2023 cultures grew E. coli,, Citrobacter all Bactrim  sensitive discharged on Bactrim  which patient completed on 7/29.  At that visit patient had complained of dysuria.  Urine cultures obtained which grew VRE.  Patient was given 10 days of linezolid  on 10/5.  Referred to infectious disease and urology.  On 09/26/2023 she underwent cystoscopy with stent placement.  Left ureteroscopy demonstrated stone and ureter around pelvic and left stone was mostly dusted with basket removal free fragments.  Review of Systems  All other systems reviewed and are negative.   Past Medical History:  Diagnosis Date   Arthritis    Blood in stool    greater than 4 years since  last incidence of blood in stool    Colon polyps    Glaucoma    Hydroureteronephrosis 07/2023   Hyperlipidemia    Hypertension    Inclusion cyst 2021   face   Perforation of sigmoid colon (HCC) 07/2023   Colitis   PNA (pneumonia) 2006   Sepsis (HCC) 07/2023   Strain of left wrist 04/16/2019    Outpatient Medications Prior to Visit  Medication Sig Dispense Refill   acetaminophen  (TYLENOL ) 500 MG tablet Take 2 tablets (1,000 mg total) by mouth every 6 (six) hours as needed.     albuterol  (VENTOLIN  HFA) 108 (90 Base) MCG/ACT inhaler Inhale 2 puffs into the lungs every 6 (six) hours as needed for wheezing or shortness of breath. 8 g 0   amoxicillin -clavulanate (AUGMENTIN ) 875-125 MG tablet Take 1 tablet by mouth 2 (two) times daily for 7 days. DO NOT TAKE WHILE TAKING NITROFURANTOIN  14 tablet 0   carvedilol  (COREG ) 6.25 MG tablet Take 1 tablet (6.25 mg total) by mouth 2 (two) times daily with a meal. 60 tablet 5   celecoxib (CELEBREX) 100 MG capsule Take 100 mg by mouth 2 (two) times daily.     ferrous sulfate  325 (65 FE) MG tablet Take 1 tablet (325 mg total) by mouth every other day. Follow your iron and anemia with your PCP outpatient 15 tablet 0   Hyoscyamine  Sulfate SL (LEVSIN /SL) 0.125 MG SUBL Place 1 tablet (0.125 mg total) under the tongue every 6 (six) hours as needed (bladder spasms, stent discomfort). 20 tablet 1   ipratropium (ATROVENT ) 0.03 % nasal spray Place 2 sprays into both nostrils every 12 (twelve) hours. 30  mL 0   latanoprost  (XALATAN ) 0.005 % ophthalmic solution Place 1 drop into both eyes at bedtime.  4   lisinopril  (ZESTRIL ) 10 MG tablet Take 1 tablet (10 mg total) by mouth daily. 90 tablet 1   methocarbamol  (ROBAXIN ) 500 MG tablet Take 1 tablet (500 mg total) by mouth 2 (two) times daily as needed for muscle spasms. 60 tablet 1   Multiple Vitamin (MULTIVITAMIN) tablet Take 1 tablet by mouth daily.     ondansetron  (ZOFRAN -ODT) 4 MG disintegrating tablet Take 1 tablet  (4 mg total) by mouth every 8 (eight) hours as needed for nausea or vomiting. 20 tablet 0   pantoprazole  (PROTONIX ) 40 MG tablet Take 1 tablet (40 mg total) by mouth daily. 90 tablet 1   phenazopyridine (PYRIDIUM) 97 MG tablet Take 97 mg by mouth 3 (three) times daily.     polyethylene glycol powder (GLYCOLAX /MIRALAX ) 17 GM/SCOOP powder Take 17 g by mouth daily. 238 g 0   Probiotic Product (DAILY PROBIOTIC PO) Take 1 capsule by mouth daily at 12 noon.     timolol  (TIMOPTIC ) 0.5 % ophthalmic solution Place 1 drop into both eyes daily.     No facility-administered medications prior to visit.     Allergies  Allergen Reactions   Codeine Other (See Comments)    Irritable , insomnia     Social History   Tobacco Use   Smoking status: Former    Current packs/day: 0.00    Types: Cigars, Cigarettes    Passive exposure: Past   Smokeless tobacco: Never   Tobacco comments:    *occasional smoker   Vaping Use   Vaping status: Former  Substance Use Topics   Alcohol use: Not Currently    Alcohol/week: 4.0 standard drinks of alcohol    Types: 4 Glasses of wine per week    Comment: *rarely    Drug use: No    Family History  Problem Relation Age of Onset   Heart attack Father    Heart attack Sister    Hyperlipidemia Sister    Hypertension Sister     Objective:  There were no vitals filed for this visit. There is no height or weight on file to calculate BMI.  Physical Exam Constitutional:      Appearance: Normal appearance.  HENT:     Head: Normocephalic and atraumatic.     Right Ear: Tympanic membrane normal.     Left Ear: Tympanic membrane normal.     Nose: Nose normal.     Mouth/Throat:     Mouth: Mucous membranes are moist.  Eyes:     Extraocular Movements: Extraocular movements intact.     Conjunctiva/sclera: Conjunctivae normal.     Pupils: Pupils are equal, round, and reactive to light.  Cardiovascular:     Rate and Rhythm: Normal rate and regular rhythm.     Heart  sounds: No murmur heard.    No friction rub. No gallop.  Pulmonary:     Effort: Pulmonary effort is normal.     Breath sounds: Normal breath sounds.  Abdominal:     General: Abdomen is flat.     Palpations: Abdomen is soft.  Skin:    General: Skin is warm and dry.  Neurological:     General: No focal deficit present.     Mental Status: She is alert and oriented to person, place, and time.  Psychiatric:        Mood and Affect: Mood normal.    Transgluteal drain  Lab Results: Lab Results  Component Value Date   WBC 6.3 09/06/2023   HGB 11.1 (L) 09/06/2023   HCT 34.3 (L) 09/06/2023   MCV 90.2 09/06/2023   PLT 381.0 09/06/2023    Lab Results  Component Value Date   CREATININE 0.56 09/06/2023   BUN 17 09/06/2023   NA 137 09/06/2023   K 4.0 09/06/2023   CL 99 09/06/2023   CO2 30 09/06/2023    Lab Results  Component Value Date   ALT 21 09/06/2023   AST 19 09/06/2023   ALKPHOS 81 09/06/2023   BILITOT 0.3 09/06/2023     Assessment & Plan:  #VRE UTI-treated #Infected pelvic fluid SP  trans gluteal drain placement with  fistula to rectum -Hx of  fluctuant peritonitis in the setting of colitis underwent sigmoidectomy and end colostomy on 07/30/2023.  IR drain was placed on 08/02/2023 cultures grew E. coli,, Citrobacter all Bactrim  sensitive discharged on Bactrim  which patient completed on 7/29.   Had dysuria at PCP visit on 7/30 with urine cultures growing VRE.  Rx'd linezolid  x 10 days on 8/5.  Referred to urology and infectious disease CT abdomen pelvis on 8/8 showed resolution of previously visualized pelvic fluid collection after drain repositioning, pigtail still in position.  Drain was removed on 8/13 with IR.  Similar appearance of indwelling left double-J nephroureteral stent which was in position.  Persistent mild left hydronephrosis and left extrarenal pelvis. 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 Rx macrobid  post or competed on 8/24 -she had uri symtoms since then  but never took augmentin  Plan Cbc /cmp stable on 7/30. Denies fever/chills, abdominal pain. Hold off on further abx given resolution of fluid collection.  Follow up video visit to ensure drain out in 2 weeks. Sees ir tomorrow  Loney Stank, MD [facility]   10/03/23  8:49 AM  I have personally spent 65 minutes involved in face-to-face and non-face-to-face activities for this patient on the day of the visit. Professional time spent includes the following activities: Preparing to see the patient (review of tests), Obtaining and/or reviewing separately obtained history (admission/discharge record), Performing a medically appropriate examination and/or evaluation , Ordering medications/tests/procedures, referring and communicating with other health care professionals, Documenting clinical information in the EMR, Independently interpreting results (not separately reported), Communicating results to the patient/family/caregiver, Counseling and educating the patient/family/caregiver and Care coordination (not separately reported).

## 2023-10-03 NOTE — Telephone Encounter (Signed)
 Home health order signed and returned to CMA work basket

## 2023-10-03 NOTE — Telephone Encounter (Signed)
 [facility] faxed 1 form for review and signature from provider.  Order # [id number]  [employer] front office

## 2023-10-03 NOTE — Telephone Encounter (Signed)
 Home health orders received 10/03/23 for Angela [name] Angela [name] health initiation orders: No.  Home health re-certification orders: Yes. Patient last seen by ordering physician for this condition: 09/06/23. Must be less than 90 days for re-certification and less than 30 days prior for initiation. Visit must have been for the condition the orders are being placed.  Patient meets criteria for Physician to sign orders: Yes.       Orders placed on physicians desk for signature: 10/03/23 If patient does not meet criteria for orders to be signed: pt was called to schedule appt. Appt is scheduled for n/a.   Dhanya Bogle A Jasneet Schobert

## 2023-10-04 ENCOUNTER — Other Ambulatory Visit: Payer: Self-pay

## 2023-10-04 ENCOUNTER — Ambulatory Visit
Admission: RE | Admit: 2023-10-04 | Discharge: 2023-10-04 | Disposition: A | Discharge status: Home / Self Care | Source: Ambulatory Visit | Attending: General Surgery | Admitting: General Surgery

## 2023-10-04 ENCOUNTER — Ambulatory Visit
Admission: RE | Admit: 2023-10-04 | Discharge: 2023-10-04 | Disposition: A | Discharge status: Home / Self Care | Source: Ambulatory Visit | Attending: Student | Admitting: Student

## 2023-10-04 DIAGNOSIS — K55039 Acute (reversible) ischemia of large intestine, extent unspecified: Secondary | ICD-10-CM | POA: Diagnosis not present

## 2023-10-04 DIAGNOSIS — K631 Perforation of intestine (nontraumatic): Secondary | ICD-10-CM

## 2023-10-04 DIAGNOSIS — K632 Fistula of intestine: Secondary | ICD-10-CM | POA: Diagnosis not present

## 2023-10-04 HISTORY — PX: IR RADIOLOGIST EVAL & MGMT: IMG5224

## 2023-10-04 MED ORDER — IOPAMIDOL (ISOVUE-300) INJECTION 61%
30.0000 mL | Freq: Once | INTRAVENOUS | Status: AC | PRN
  Administered 2023-10-04: 7 mL

## 2023-10-04 NOTE — Transitions of Care (Post Inpatient/ED Visit) (Signed)
 Transition of Care week 5  Visit Note  10/04/2023  Name: Angela [name] MRN: [id number]          DOB: [birth year]  Situation: Patient enrolled in [facility] 30-day program. Visit completed with patient by telephone.   Background:   Initial Transition Care Management Follow-up Telephone Call    Past Medical History:  Diagnosis Date   Arthritis    Blood in stool    greater than 4 years since last incidence of blood in stool    Colon polyps    Glaucoma    Hydroureteronephrosis 07/2023   Hyperlipidemia    Hypertension    Inclusion cyst 2021   face   Perforation of sigmoid colon (HCC) 07/2023   Colitis   PNA (pneumonia) 2006   Sepsis (HCC) 07/2023   Strain of left wrist 04/16/2019    Assessment: Reports she is doing well. Denies any urinary symptoms. Reports that she continues to have drain to buttocks. ( With no drainage) . Patient will see surgeon next week.. No new problems or concerns today.  Patient Reported Symptoms: Cognitive Cognitive Status: Able to follow simple commands, Alert and oriented to person, place, and time, Normal speech and language skills      Neurological Neurological Review of Symptoms: No symptoms reported    HEENT HEENT Symptoms Reported: No symptoms reported      Cardiovascular Cardiovascular Symptoms Reported: No symptoms reported    Respiratory Respiratory Symptoms Reported: No symptoms reported    Endocrine Endocrine Symptoms Reported: No symptoms reported    Gastrointestinal Gastrointestinal Symptoms Reported: Other Other Gastrointestinal Symptoms: Reports moving bowel through ostomy without difficutly.  No drainage from drain in buttocks.  Reports improved appetite. Gastrointestinal Management Strategies: Medication therapy    Genitourinary Genitourinary Symptoms Reported: Other Other Genitourinary Symptoms: Reports renal stent removed 5 days ago. Denies any urinary problems at this time. Completed antibiotics Genitourinary Self-Management  Outcome: 4 (good)  Integumentary Integumentary Symptoms Reported: Incision Additional Integumentary Details: Continues to have mid line abdominal incision. Reports healing well. Home health nurse comes once a week. Husband does dressing changes twice a day.  Denies any signs of infection. Skin Management Strategies: Medical device Skin Self-Management Outcome: 4 (good)  Musculoskeletal Musculoskelatal Symptoms Reviewed: No symptoms reported        Psychosocial Psychosocial Symptoms Reported: No symptoms reported         Today's Vitals   10/04/23 1440  PainSc: 0-No pain    Medications Reviewed Today     Reviewed by Rumalda Alan PENNER, RN (Registered Nurse) on 10/04/23 at 1411  Med List Status: <None>   Medication Order Taking? Sig Documenting Provider Last Dose Status Informant  acetaminophen  (TYLENOL ) 500 MG tablet [id number] Yes Take 2 tablets (1,000 mg total) by mouth every 6 (six) hours as needed. Perri DELENA Meliton Mickey., MD  Active Self  albuterol  (VENTOLIN  HFA) 108 [phone] Base) MCG/ACT inhaler [id number] Yes Inhale 2 puffs into the lungs every 6 (six) hours as needed for wheezing or shortness of breath. Kuneff, Renee A, DO  Active Self  amoxicillin -clavulanate (AUGMENTIN ) 875-125 MG tablet [id number]  Take 1 tablet by mouth 2 (two) times daily for 7 days. DO NOT TAKE WHILE TAKING NITROFURANTOIN   Patient not taking: Reported on 10/04/2023   Fleming, Zelda W, NP  Active   carvedilol  (COREG ) 6.25 MG tablet [id number] Yes Take 1 tablet (6.25 mg total) by mouth 2 (two) times daily with a meal. Kuneff, Renee A, DO  Active Self  celecoxib (CELEBREX) 100  MG capsule [id number] Yes Take 100 mg by mouth 2 (two) times daily. [provider]  Active   ferrous sulfate  325 (65 FE) MG tablet [id number] Yes Take 1 tablet (325 mg total) by mouth every other day. Follow your iron and anemia with your PCP outpatient Perri DELENA Meliton Mickey., MD  Active Self  Hyoscyamine  Sulfate SL (LEVSIN AMIEL) 0.125 MG SUBL  [id number] Yes Place 1 tablet (0.125 mg total) under the tongue every 6 (six) hours as needed (bladder spasms, stent discomfort). Lovie Arlyss CROME, MD  Active   ipratropium (ATROVENT ) 0.03 % nasal spray [id number] Yes Place 2 sprays into both nostrils every 12 (twelve) hours. Fleming, Zelda W, NP  Active   latanoprost  (XALATAN ) 0.005 % ophthalmic solution [id number] Yes Place 1 drop into both eyes at bedtime. [provider]  Active Self  lisinopril  (ZESTRIL ) 10 MG tablet [id number] Yes Take 1 tablet (10 mg total) by mouth daily. Kuneff, Renee A, DO  Active Self  methocarbamol  (ROBAXIN ) 500 MG tablet [id number] Yes Take 1 tablet (500 mg total) by mouth 2 (two) times daily as needed for muscle spasms. Catherine, Renee A, DO  Active Self  Multiple Vitamin (MULTIVITAMIN) tablet [id number] Yes Take 1 tablet by mouth daily. [provider]  Active Self  ondansetron  (ZOFRAN -ODT) 4 MG disintegrating tablet [id number] Yes Take 1 tablet (4 mg total) by mouth every 8 (eight) hours as needed for nausea or vomiting. Kuneff, Renee A, DO  Active Self  pantoprazole  (PROTONIX ) 40 MG tablet [id number] Yes Take 1 tablet (40 mg total) by mouth daily. Kuneff, Renee A, DO  Active Self  phenazopyridine (PYRIDIUM) 97 MG tablet [id number]  Take 97 mg by mouth 3 (three) times daily.  Patient not taking: Reported on 10/04/2023   [provider]  Active   polyethylene glycol powder (GLYCOLAX /MIRALAX ) 17 GM/SCOOP powder [id number] Yes Take 17 g by mouth daily. Perri DELENA Meliton Mickey., MD  Active Self  Probiotic Product (DAILY PROBIOTIC PO) [id number] Yes Take 1 capsule by mouth daily at 12 noon. [provider]  Active Self  timolol  (TIMOPTIC ) 0.5 % ophthalmic solution [id number] Yes Place 1 drop into both eyes daily. [provider]  Active Self            Goals Addressed             This Visit's Progress    COMPLETED: [facility] Transitions of Care (TOC) Care Plan       Problems:  Recent  Hospitalization for treatment of bowel perforation 09/12/2023  Reports ostomy bag and dressing changes are doing well. 09/19/2023  Seen at the ostomy clinic and reports ostomy care is going well. Patient is having pasty brown stool. States it was suggested that she increase her miralax . Patient was seen by IR and drain was place to bag by gravity.  Patient reports the bag is not working well.  ( Reports the bag is huge-627ml).  Patient reports the bag is pulling and causing a lot of pain. Patient reports very little drain. States that drainage now looks like it has sediment in it.  Patient is going to call IT today about the pain and need to do something different. Patient scheduled to see IR on 8/13/205 at 745 am to evaluation of drain and site. 09/25/2023 Patient report that abdominal incision if healing well. Husband continues to do dressing changes.  Continues to have rectal drain with no drainage.  09/27/2023  Reports midline abdominal incision healing. Husband continues to do dressing  changes twice a day.  Home health nurse coming on 09/29/2023 to remove stent.  Rectal drain without any drainage since IR visit last week.  Patient reports pasty stool in ostomy bag and taking her miralax  daily. 10/04/2023  Patient reports that she still has her drain in her buttocks.   Saw IR today and patient will follow up with surgeon next week.  Patient continues to have home health for dressing changes for abdomen. Husband continues to change dressing twice a day.   Renal stent removed last Friday.  Denies any urinary symptoms at this time. She has completed her antibiotic therapy.  Recent sinus pressure over the weekend.  Reports sinus pressure went away on its own.  Did not start Augmentin .  Reports she had follow up with ID yesterday,  No new concerns today but disappointment about having to keep drain in her buttocks.  No drainage from drain.  Home Health services barrier: confirmed that home health will start on  09/06/2023 09/12/2023  home health nurse coming today. 09/19/2023  Home health nurse came today, nurse reports mid abdominal incision site is healing well but slow.  Patient has increased her protein intake. 09/25/2023  Patient continues to have home health nursing for dressing changes and patient reports next visit is planned for 09/29/2023      09/27/2023 active with home health.  10/04/2023  Continues to be active with home health- next visit in the next 2 days.  UTI- per husband 1 and a half plus gallons of urine in 24 hours. Patient unable to control urination. Wearing diapers and is incontinent. Last antibiotics tonight.  Positive urine culture. 09/12/2023 Saw PCP and still has a UTI. Continues to have urinary frequency. Antibiotics changed yesterday. 09/19/2023 Patient reports recent change of antibiotic. States she has 3 days left.  States she continues to have frequency ( 6 times per hour) and dysuria.  Dysuria pain 7/10.  Patient is pending pre op tomorrow for kidney stone extraction on 09/26/2023. Denies fever but reports poor energy levels. Message back from PCP discuss with urology urgent and patient needs to see ID. Scheduled with ID on 10/03/2023- ( needs a sooner appointment)  09/25/2023  Urine culture came back negative.  Patient continues to have dysuria and frequency. Reports pyridum and celebrex has helped some.  Still voiding every 10-15 minutes and is now wearing diapers to be able to get some sleep. 09/27/2023  Kidney stone surgery yesterday wife a new stent placed.  Patient reports decrease in urination and decrease in pain. Restarted on antibiotics, Levsin ,  Taking all other medications as prescribed. Has only taken 1 ultram . 10/04/2023   Patient reports stent is out and no urinary problems at this time Goal:  Over the next 30 days, the patient will not experience hospital readmission  Interventions:  Transitions of Care:  Doctor Visits  - discussed the importance of doctor visits Encouraged  patient to drink a lot of fluid. Reviewed energy levels and need for rest Confirmed dressing changes and ostomy care is going well. Reviewed medical record and ID and IR visits.  Encouraged patient to eat well with increased protein Reviewed output of ostomy and drain. Reviewed no drainage from rectal drain.  Surgery (Perforated bowel, kidney stent): Reviewed that home health is still active with home visits.  Reviewed pain  Encouraged patient to make a list and ask her questions to surgeon next week. Reviewed case closure as patient has completed TOC program.  Offered CCM referral and patient  declined.   Patient self care activities:  Attend all scheduled provider appointments Call pharmacy for medication refills 3-7 days in advance of running out of medications Call provider office for new concerns or questions  Take medications as prescribed   Stay hydrated Husband to continue to do dressing changes. Call home health for any new concerns. Call MD or seek emergency care if needed. Continue to stay hydrated.  See surgeon as planned next week.   Plan: Patient has completed TOC program. Successfully Offered to transfer to CCM and patient declined.         Recommendation:   Call MD for any changes in condition  Follow Up Plan:   Closing From:  Transitions of Care Program  Alan Ee, RN, BSN, [employer]- Transition of Care Team.  [facility] [phone]

## 2023-10-04 NOTE — Progress Notes (Signed)
 Chief Complaint: Patient was seen in consultation today for colon perforation with abscess formation secondary to ischemic colitis, and s/p transgluteal drain placement.  Referring Physician(s): Dr. Richerd Silversmith, MD  Supervising Physician: Jenna Hacker  History of Present Illness: Angela [name] is a 73 y.o. female who was admitted from 07/30/23 to 7/28 due to colitis with ischemia and perforated colon, underwent ex lap and Hartmann's by Dr. Polly procedure on 6/22. CT on 6/29 showed complex free fluid w/ air in the surgical bed concerning for superimposed infection vs bowel leak, she underwent R TG drain placement by Dr. Philip on 6/30.    CT on 7/5 showed additional intraabdominal fluid collection as well as retracted R TG drain, patient underwent R TG drain repositioning and RLQ drain placement on 7/7.    CT on 7/16 showed resolution of the fluid collection around the RLQ drain, injection on 7/18 confirmed no fistula,the RLQ drain was removed on 7/17. The R TG drain was not removed due to persistent output. Patient underwent R TG drain repositioning on 7/23, drain injection showed no fistula.   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 Was instructed to not flush and leave to gravity bag, which she has been doing since last visit. She was again seen in clinic for close follow-up on 8/13, at which time the drain injection continued to demonstrate a patent fistulous tract, and patient was requested to maintain current treatment plan with abstinence from flushing the drain, recording daily output, and return in 2 weeks for repeat drain injection only.   Pt presenting back to clinic today for 2 week follow-up with repeat drain injection. No fevers/chills or additional complaints. Patient feels well, at baseline, though eager to have her drain removed.  She is scheduled to follow-up with her Surgeon next Tuesday 9/2, per her  report.  Past Medical History:  Diagnosis Date   Arthritis    Blood in stool    greater than 4 years since last incidence of blood in stool    Colon polyps    Glaucoma    Hydroureteronephrosis 07/2023   Hyperlipidemia    Hypertension    Inclusion cyst 2021   face   Perforation of sigmoid colon (HCC) 07/2023   Colitis   PNA (pneumonia) 2006   Sepsis (HCC) 07/2023   Strain of left wrist 04/16/2019    Past Surgical History:  Procedure Laterality Date   ABSCESS DRAINAGE  2021   face- inclusion cyst   BREAST BIOPSY Left 07/19/2018   US  guided breast bx with clip placement- benign   COLON RESECTION SIGMOID  07/30/2023   Procedure: COLECTOMY, SIGMOID, OPEN;  Surgeon: Polly Hacker LABOR, MD;  Location: [facility] OR;  Service: General;;   CYSTOSCOPY W/ URETERAL STENT PLACEMENT Left 08/18/2023   Procedure: CYSTOSCOPY, WITH RETROGRADE PYELOGRAM AND URETERAL STENT INSERTION;  Surgeon: Lovie Arlyss CROME, MD;  Location: [facility] OR;  Service: Urology;  Laterality: Left;   CYSTOSCOPY/URETEROSCOPY/HOLMIUM LASER/STENT PLACEMENT Left 09/26/2023   Procedure: CYSTOSCOPY/URETEROSCOPY/HOLMIUM LASER/STENT PLACEMENT;  Surgeon: Lovie Arlyss CROME, MD;  Location: [facility];  Service: Urology;  Laterality: Left;   EYE SURGERY  1956   HEMORRHOID SURGERY  2001   IR CATHETER TUBE CHANGE  08/30/2023   IR CV LINE INJECTION  08/24/2023   IR RADIOLOGIST EVAL & MGMT  09/15/2023   IR RADIOLOGIST EVAL & MGMT  09/20/2023   LAPAROTOMY N/A 07/30/2023   Procedure: LAPAROTOMY, EXPLORATORY;  Surgeon: Polly Cordella LABOR, MD;  Location: [facility];  Service: General;  Laterality: N/A;   RADIOACTIVE SEED GUIDED EXCISIONAL BREAST BIOPSY Left 03/15/2019   Procedure: RADIOACTIVE SEED GUIDED EXCISIONAL LEFT  BREAST BIOPSY;  Surgeon: Gladis Cough, MD;  Location: [facility];  Service: General;  Laterality: Left;   TONSILLECTOMY  1963   WISDOM TOOTH EXTRACTION      Allergies: Codeine  Medications: Prior to Admission medications    Medication Sig Start Date End Date Taking? Authorizing Provider  acetaminophen  (TYLENOL ) 500 MG tablet Take 2 tablets (1,000 mg total) by mouth every 6 (six) hours as needed. 09/04/23   Perri LABOR Meliton Mickey., MD  albuterol  (VENTOLIN  HFA) [phone] Base) MCG/ACT inhaler Inhale 2 puffs into the lungs every 6 (six) hours as needed for wheezing or shortness of breath. Patient not taking: Reported on 10/03/2023 03/03/22   Catherine Fuller A, DO  amoxicillin -clavulanate (AUGMENTIN ) 875-125 MG tablet Take 1 tablet by mouth 2 (two) times daily for 7 days. DO NOT TAKE WHILE TAKING NITROFURANTOIN  Patient not taking: Reported on 10/03/2023 09/30/23 10/07/23  Fleming, Zelda W, NP  carvedilol  (COREG ) 6.25 MG tablet Take 1 tablet (6.25 mg total) by mouth 2 (two) times daily with a meal. 09/06/23   Kuneff, Renee A, DO  celecoxib (CELEBREX) 100 MG capsule Take 100 mg by mouth 2 (two) times daily.    [provider]  ferrous sulfate  325 (65 FE) MG tablet Take 1 tablet (325 mg total) by mouth every other day. Follow your iron and anemia with your PCP outpatient 09/04/23 10/04/23  Perri LABOR Meliton Mickey., MD  Hyoscyamine  Sulfate SL (LEVSIN AMIEL) 0.125 MG SUBL Place 1 tablet (0.125 mg total) under the tongue every 6 (six) hours as needed (bladder spasms, stent discomfort). 09/26/23   Lovie Arlyss CROME, MD  ipratropium (ATROVENT ) 0.03 % nasal spray Place 2 sprays into both nostrils every 12 (twelve) hours. Patient not taking: Reported on 10/03/2023 09/30/23   Fleming, Zelda W, NP  latanoprost  (XALATAN ) 0.005 % ophthalmic solution Place 1 drop into both eyes at bedtime. 08/11/17   [provider]  lisinopril  (ZESTRIL ) 10 MG tablet Take 1 tablet (10 mg total) by mouth daily. 09/06/23   Kuneff, Renee A, DO  methocarbamol  (ROBAXIN ) 500 MG tablet Take 1 tablet (500 mg total) by mouth 2 (two) times daily as needed for muscle spasms. 09/06/23   Kuneff, Renee A, DO  Multiple Vitamin (MULTIVITAMIN) tablet Take 1 tablet by mouth daily.     [provider]  ondansetron  (ZOFRAN -ODT) 4 MG disintegrating tablet Take 1 tablet (4 mg total) by mouth every 8 (eight) hours as needed for nausea or vomiting. 09/06/23   Kuneff, Renee A, DO  pantoprazole  (PROTONIX ) 40 MG tablet Take 1 tablet (40 mg total) by mouth daily. 09/06/23   Kuneff, Renee A, DO  phenazopyridine (PYRIDIUM) 97 MG tablet Take 97 mg by mouth 3 (three) times daily.    [provider]  polyethylene glycol powder (GLYCOLAX /MIRALAX ) 17 GM/SCOOP powder Take 17 g by mouth daily. 09/04/23   Perri LABOR Meliton Mickey., MD  Probiotic Product (DAILY PROBIOTIC PO) Take 1 capsule by mouth daily at 12 noon. 09/19/23   [provider]  timolol  (TIMOPTIC ) 0.5 % ophthalmic solution Place 1 drop into both eyes daily. 11/14/19   [provider]     Family History  Problem Relation Age of Onset   Heart attack Father    Heart attack Sister    Hyperlipidemia Sister  Hypertension Sister     Social History   Socioeconomic History   Marital status: Married    Spouse name: Not on file   Number of children: Not on file   Years of education: Not on file   Highest education level: Bachelor's degree (e.g., BA, AB, BS)  Occupational History   Not on file  Tobacco Use   Smoking status: Former    Current packs/day: 0.00    Types: Cigars, Cigarettes    Passive exposure: Past   Smokeless tobacco: Never   Tobacco comments:    *occasional smoker   Vaping Use   Vaping status: Former  Substance and Sexual Activity   Alcohol use: Not Currently    Alcohol/week: 4.0 standard drinks of alcohol    Types: 4 Glasses of wine per week    Comment: *rarely    Drug use: No   Sexual activity: Yes    Partners: Male    Comment: Married  Other Topics Concern   Not on file  Social History Narrative   Married. Retired. 2 children.   Bachelors degree.   Exercises routinely.   Drinks caffeine.   Smoke alarm in the home. Wears her seatbelt. Owns firearms.   Feels safe  in her relationships.   Social Drivers of Health   Financial Resource Strain: Patient Declined (04/19/2023)   Overall Financial Resource Strain (CARDIA)    Difficulty of Paying Living Expenses: Patient declined  Food Insecurity: Unknown (09/05/2023)   Hunger Vital Sign    Worried About Running Out of Food in the Last Year: Never true    Ran Out of Food in the Last Year: Not on file  Transportation Needs: No Transportation Needs (09/05/2023)   PRAPARE - Administrator, Civil Service (Medical): No    Lack of Transportation (Non-Medical): No  Physical Activity: Sufficiently Active (04/19/2023)   Exercise Vital Sign    Days of Exercise per Week: 5 days    Minutes of Exercise per Session: 30 min  Stress: No Stress Concern Present (04/19/2023)   [employer] of Occupational Health - Occupational Stress Questionnaire    Feeling of Stress : Not at all  Social Connections: Patient Declined (08/02/2023)   Social Connection and Isolation Panel    Frequency of Communication with Friends and Family: Patient declined    Frequency of Social Gatherings with Friends and Family: Patient declined    Attends Religious Services: Patient declined    Database administrator or Organizations: Patient declined    Attends Banker Meetings: Patient declined    Marital Status: Patient declined    Review of Systems: A 12 point ROS discussed and pertinent positives are indicated in the HPI above.  All other systems are negative.   Vital Signs: There were no vitals taken for this visit.  Physical Exam Constitutional:      Appearance: Normal appearance.  Cardiovascular:     Rate and Rhythm: Normal rate.  Pulmonary:     Effort: Pulmonary effort is normal.  Musculoskeletal:        General: Normal range of motion.  Skin:    General: Skin is warm and dry.     Comments: Left TG drain appropriately dressed. Dressing is clean, dry, intact. Drain incision site non-tender, without  evidence of infection. Retaining suture in place.  No appreciable output in collection bag.      Neurological:     Mental Status: She is alert and oriented to person, place, and  time.       Imaging: DG C-Arm 1-60 Min-No Report Result Date: 09/26/2023 Fluoroscopy was utilized by the requesting physician.  No radiographic interpretation.   DG Sinus/Fist Tube Chk-Non GI Result Date: 09/20/2023 INDICATION: Trans gluteal pelvic abscess drain, localized pain EXAM: FLUOROSCOPIC INJECTION OF THE TRANS GLUTEAL ABSCESS DRAIN MEDICATIONS: NONE. ANESTHESIA/SEDATION: None. COMPLICATIONS: None immediate. PROCEDURE: Informed written consent was obtained from the patient after a thorough discussion of the procedural risks, benefits and alternatives. All questions were addressed. Maximal Sterile Barrier Technique was utilized including caps, mask, sterile gowns, sterile gloves, sterile drape, hand hygiene and skin antiseptic. A timeout was performed prior to the initiation of the procedure. Under fluoroscopy, the existing abscess drain was injected with contrast. Stable drain catheter position. Similar collapsed abscess cavity. 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 No interval change compared to 09/15/2023. IMPRESSION: Stable trans gluteal abscess drain with a persistent patent fistula to the rectum. PLAN: Drain catheter to remain. No further flushing. Abscess drain remains to gravity drainage. Repeat injection in 2 weeks. Electronically Signed   By: CHRISTELLA.  Shick M.D.   On: 09/20/2023 08:46   IR Radiologist Eval & Mgmt Result Date: 09/20/2023 : Please see the dictated noted in [facility] from today for this IR rad eval and management. Electronically Signed   By: CHRISTELLA.  Shick M.D.   On: 09/20/2023 08:38   CT ABDOMEN PELVIS W CONTRAST Result Date: 09/15/2023 CLINICAL DATA:  74 year old female with history Hartmann's procedure complicated postoperatively pelvic abscess formation status post percutaneous drain placement  08/14/2023, exchanged on 08/30/2023. EXAM: CT ABDOMEN AND PELVIS WITH CONTRAST TECHNIQUE: Multidetector CT imaging of the abdomen and pelvis was performed using the standard protocol following bolus administration of intravenous contrast. RADIATION DOSE REDUCTION: This exam was performed according to the departmental dose-optimization program which includes automated exposure control, adjustment of the mA and/or kV according to patient size and/or use of iterative reconstruction technique. CONTRAST:  100mL ISOVUE -300 IOPAMIDOL  (ISOVUE -300) INJECTION 61% COMPARISON:  08/30/2023, 08/29/2018 FINDINGS: Lower chest: No acute abnormality. Hepatobiliary: A few unchanged simple appearing hepatic cysts are again visualized. No new focal liver abnormality is seen. No gallstones, gallbladder wall thickening, or biliary dilatation. Pancreas: Unremarkable. No pancreatic ductal dilatation or surrounding inflammatory changes. Spleen: Normal in size without focal abnormality. Adrenals/Urinary Tract: Adrenal glands are unremarkable. Similar appearance of a few scattered bilateral simple renal cysts that do not require additional follow-up. Similar appearance of indwelling left double-J nephroureteral stent which appears well positioned. There is persistent mild left hydronephrosis with left extrarenal pelvis. No evidence of significant hydroureter. Kidneys are otherwise normal, without renal calculi, focal lesion, or hydronephrosis. Bladder is unremarkable. Stomach/Bowel: Stomach is within normal limits. Appendix is not definitively identified. Similar appearance of partial left hemicolectomy within ostomy in the left lower quadrant. Residual enteric contrast material is visualized in the rectum. No evidence of bowel wall thickening, distention, or inflammatory changes. Vascular/Lymphatic: Aortic atherosclerosis. No enlarged abdominal or pelvic lymph nodes. Reproductive: Status post hysterectomy. No adnexal masses. Other: Resolution  of previously visualized pelvic fluid collection after drain repositioning. The indwelling right transgluteal pigtail drainage catheter is in similar position. Musculoskeletal: Multilevel degenerative changes of the visualized thoracolumbar spine. No acute osseous abnormality. IMPRESSION: 1. Resolution of previously visualized pelvic fluid collection after drain repositioning. The indwelling right transgluteal pigtail drainage catheter is in similar position. 2. Similar appearance of indwelling left double-J nephroureteral stent which appears well positioned. There is persistent mild left hydronephrosis with left extrarenal pelvis. No evidence of  significant hydroureter. 3. Similar appearance of partial left hemicolectomy within ostomy in the left lower quadrant. 4.  Aortic Atherosclerosis (ICD10-I70.0). Ester Sides, MD [facility] Electronically Signed   By: Ester Sides M.D.   On: 09/15/2023 13:32   IR Radiologist Eval & Mgmt Result Date: 09/15/2023 CLINICAL DATA:  73 year old female the history ischemic colitis with perforation status post Hartmann's procedure 07/30/2023. She underwent multiple drain placements and exchanges. Presents for right TG drain eval. EXAM: FLUORO GUIDED RIGHT TRANS GLUTEAL DRAIN INJECTION. COMPARISON:  R TG drain injection performed 08/30/2023 CONTRAST:  8 mL Isovue -300-administered via the existing percutaneous drain. FLUOROSCOPY TIME:  1.9 mGy TECHNIQUE: The patient was positioned prone on the fluoroscopy table. A preprocedural spot fluoroscopic image was obtained of the pelvic area and the existing percutaneous drainage catheter. Fluoroscopic and radiographic images were obtained following the injection of a small amount of contrast via the existing percutaneous drainage catheter. Contrast injection showed fistula to the rectum. Injection was terminated, approximately 2 mL of contrast was aspirated back. The drain was  flushed with 5 mL of normal saline. New dressing was placed after the procedure. Small residual contrast seen in the urinary bladder. Patient received IV contrast for CT prior to the drain injection. 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 Performed by: Aimee Han, PA-C Electronically Signed   By: Ester Sides M.D.   On: 09/15/2023 11:56   DG Sinus/Fist Tube Chk-Non GI Result Date: 09/15/2023 CLINICAL DATA:  73 year old female the history ischemic colitis with perforation status post Hartmann's procedure 07/30/2023. She underwent multiple drain placements and exchanges. Presents for right TG drain eval. EXAM: FLUORO GUIDED RIGHT TRANS GLUTEAL DRAIN INJECTION. COMPARISON:  R TG drain injection performed 08/30/2023 CONTRAST:  8 mL Isovue -300-administered via the existing percutaneous drain. FLUOROSCOPY TIME:  1.9 mGy TECHNIQUE: The patient was positioned prone on the fluoroscopy table. A preprocedural spot fluoroscopic image was obtained of the pelvic area and the existing percutaneous drainage catheter. Fluoroscopic and radiographic images were obtained following the injection of a small amount of contrast via the existing percutaneous drainage catheter. Contrast injection showed fistula to the rectum. Injection was terminated, approximately 2 mL of contrast was aspirated back. The drain was flushed with 5 mL of normal saline. New dressing was placed after the procedure. Small residual contrast seen in the urinary bladder. Patient received IV contrast for CT prior to the drain injection. 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 Performed by: Aimee Han, PA-C Electronically Signed   By: Ester Sides M.D.   On: 09/15/2023 11:56    Labs:  CBC: Recent Labs    09/01/23 0324 09/02/23 0350 09/04/23 0340 09/06/23 1139  WBC 6.1 5.3 5.9 6.3  HGB 10.3* 9.9* 9.8* 11.1*  HCT 31.9* 31.1* 31.3* 34.3*  PLT 328 305 312 381.0     COAGS: Recent Labs    07/29/23 0826 08/07/23 0909 08/15/23 0431  INR 1.1 1.2 1.1    BMP: Recent Labs    08/31/23 0358 09/01/23 0324 09/02/23 0350 09/04/23 0340 09/06/23 1139  NA 137 137 136 137 137  K 4.1 3.6 3.9 3.4* 4.0  CL 102 103 102 103 99  CO2 26 27 25 27 30   GLUCOSE 108* 102* 94 94 99  BUN 23 20 16 13 17   CALCIUM  8.4* 8.6* 8.4* 8.5* 8.7  CREATININE 0.50 0.55 0.55 0.49 0.56  GFRNONAA >60 >60 >60 >60  --  LIVER FUNCTION TESTS: Recent Labs    08/31/23 0358 09/01/23 0324 09/02/23 0350 09/06/23 1139  BILITOT 0.5 0.6 0.3 0.3  AST 15 14* 14* 19  ALT 11 12 11 21   ALKPHOS 63 70 59 81  PROT 5.2* 5.3* 5.1* 6.0  ALBUMIN  2.0* 2.0* 2.1* 3.4*    TUMOR MARKERS: No results for input(s): AFPTM, CEA, CA199, CHROMGRNA in the last 8760 hours.  Assessment:  74 year old female hx of colitis with ischemia and perforated colon s/p ex lap and Hartmann's by Dr. Polly procedure on 6/22, underwent R TG and RLQ drain placement for post op intraabdominal fluid collections. RLQ drain was removed on 7/17. She presents to the clinic today for continued R TG drain follow up.  Last seen 2 weeks ago for similar drain injection, found to have persistent fistulous connection to bowel.  Patient continues to not flush her drain, as instructed, and has been to gravity bag since last seen, with negligible output over the last 2 weeks.   Drain injection imaging obtained today was reviewed by Dr. Jenna, who recommends persisting with current plan of no flushing and leaving drain to gravity bag.  Output has been negligible over 2 weeks (<53mL). However, due to persistence of the fistulous tract, the drain will unfortunately have to remain in place. However, as patient is seeing her surgeon in 6 days, patient is encouraged to discuss adressing her fistulous tract as part of her HM colostomy reversal consult. Patient is understandibly dissapointed to maintain her drain, but voiced she  understood it was necessary, and is amenable to this plan.   IR will plan on seeing patient in clinic for a subsequent repeat injection and evaluation in an additional 2 weeks. Should patient come up with a definitive plan with her surgeon to address this fistulous concern in the interim, she knows to update the clinic.    Electronically Signed: Carlin LABOR Norah Fick PA-C 10/04/2023, 8:51 AM   Please refer to Dr. Eliazar attestation of this note for management and plan.

## 2023-10-05 ENCOUNTER — Other Ambulatory Visit: Payer: Self-pay | Admitting: Internal Medicine

## 2023-10-05 DIAGNOSIS — K9189 Other postprocedural complications and disorders of digestive system: Secondary | ICD-10-CM | POA: Diagnosis not present

## 2023-10-05 DIAGNOSIS — K5732 Diverticulitis of large intestine without perforation or abscess without bleeding: Secondary | ICD-10-CM | POA: Diagnosis not present

## 2023-10-05 DIAGNOSIS — K631 Perforation of intestine (nontraumatic): Secondary | ICD-10-CM

## 2023-10-05 DIAGNOSIS — T85528D Displacement of other gastrointestinal prosthetic devices, implants and grafts, subsequent encounter: Secondary | ICD-10-CM | POA: Diagnosis not present

## 2023-10-05 DIAGNOSIS — N136 Pyonephrosis: Secondary | ICD-10-CM | POA: Diagnosis not present

## 2023-10-05 DIAGNOSIS — D649 Anemia, unspecified: Secondary | ICD-10-CM | POA: Diagnosis not present

## 2023-10-05 DIAGNOSIS — T8143XD Infection following a procedure, organ and space surgical site, subsequent encounter: Secondary | ICD-10-CM | POA: Diagnosis not present

## 2023-10-05 DIAGNOSIS — N739 Female pelvic inflammatory disease, unspecified: Secondary | ICD-10-CM | POA: Diagnosis not present

## 2023-10-05 DIAGNOSIS — Z933 Colostomy status: Secondary | ICD-10-CM | POA: Diagnosis not present

## 2023-10-05 DIAGNOSIS — E872 Acidosis, unspecified: Secondary | ICD-10-CM | POA: Diagnosis not present

## 2023-10-05 DIAGNOSIS — I2510 Atherosclerotic heart disease of native coronary artery without angina pectoris: Secondary | ICD-10-CM | POA: Diagnosis not present

## 2023-10-05 DIAGNOSIS — I119 Hypertensive heart disease without heart failure: Secondary | ICD-10-CM | POA: Diagnosis not present

## 2023-10-12 DIAGNOSIS — D649 Anemia, unspecified: Secondary | ICD-10-CM | POA: Diagnosis not present

## 2023-10-12 DIAGNOSIS — I2510 Atherosclerotic heart disease of native coronary artery without angina pectoris: Secondary | ICD-10-CM | POA: Diagnosis not present

## 2023-10-12 DIAGNOSIS — K9189 Other postprocedural complications and disorders of digestive system: Secondary | ICD-10-CM | POA: Diagnosis not present

## 2023-10-12 DIAGNOSIS — E872 Acidosis, unspecified: Secondary | ICD-10-CM | POA: Diagnosis not present

## 2023-10-12 DIAGNOSIS — T85528D Displacement of other gastrointestinal prosthetic devices, implants and grafts, subsequent encounter: Secondary | ICD-10-CM | POA: Diagnosis not present

## 2023-10-12 DIAGNOSIS — N739 Female pelvic inflammatory disease, unspecified: Secondary | ICD-10-CM | POA: Diagnosis not present

## 2023-10-12 DIAGNOSIS — N136 Pyonephrosis: Secondary | ICD-10-CM | POA: Diagnosis not present

## 2023-10-12 DIAGNOSIS — T8143XD Infection following a procedure, organ and space surgical site, subsequent encounter: Secondary | ICD-10-CM | POA: Diagnosis not present

## 2023-10-12 DIAGNOSIS — I119 Hypertensive heart disease without heart failure: Secondary | ICD-10-CM | POA: Diagnosis not present

## 2023-10-13 ENCOUNTER — Telehealth: Payer: Self-pay

## 2023-10-13 NOTE — Telephone Encounter (Signed)
 Home health orders received 10/13/23 for Angela [name] Angela [name] health initiation orders: No.  Home health re-certification orders: Yes. Patient last seen by ordering physician for this condition: 09/06/23. Must be less than 90 days for re-certification and less than 30 days prior for initiation. Visit must have been for the condition the orders are being placed.  Patient meets criteria for Physician to sign orders: Yes.        Current med list has been attached: No        Orders placed on physicians desk for signature: 10/13/23 (date) If patient does not meet criteria for orders to be signed: pt was called to schedule appt. Appt is scheduled for N/A.   Angela [name] Angela [name]   Placed on PCP desk to review and sign, if appropriate.

## 2023-10-13 NOTE — Telephone Encounter (Signed)
 [facility] faxed 1 form for review and signature from provider.   Order # [id number]   [facility] front office

## 2023-10-16 NOTE — Telephone Encounter (Signed)
Completed and placed in C[facility] work basket 

## 2023-10-16 NOTE — Telephone Encounter (Signed)
 Forms faxed

## 2023-10-18 ENCOUNTER — Ambulatory Visit
Admission: RE | Admit: 2023-10-18 | Discharge: 2023-10-18 | Disposition: A | Discharge status: Home / Self Care | Source: Ambulatory Visit | Attending: Urology

## 2023-10-18 ENCOUNTER — Ambulatory Visit
Admission: RE | Admit: 2023-10-18 | Discharge: 2023-10-18 | Disposition: A | Discharge status: Home / Self Care | Source: Ambulatory Visit | Attending: Internal Medicine | Admitting: Internal Medicine

## 2023-10-18 DIAGNOSIS — K631 Perforation of intestine (nontraumatic): Secondary | ICD-10-CM

## 2023-10-18 DIAGNOSIS — K91850 Pouchitis: Secondary | ICD-10-CM | POA: Diagnosis not present

## 2023-10-18 DIAGNOSIS — Z4803 Encounter for change or removal of drains: Secondary | ICD-10-CM | POA: Diagnosis not present

## 2023-10-18 HISTORY — PX: IR RADIOLOGIST EVAL & MGMT: IMG5224

## 2023-10-18 MED ORDER — IOPAMIDOL (ISOVUE-300) INJECTION 61%
30.0000 mL | Freq: Once | INTRAVENOUS | Status: AC | PRN
  Administered 2023-10-18: 18 mL

## 2023-10-18 NOTE — Progress Notes (Signed)
 Patient ID: Angela [name], female   DOB: 11-Jun-1950, 73 y.o.   MRN: [id number]       Chief Complaint: Patient was seen in consultation today for R TG drain follow up at the request of Gerkin, Krystal / Polly Hacker   Referring Physician(s): Eletha Krystal / Polly Hacker   Supervising Physician: Philip Cornet  History of Present Illness: Angela [name] is a 73 y.o. female who was admitted from 07/30/23 to 7/28 due to colitis with ischemia and perforated colon, underwent ex lap and Hartmann's by Dr. Polly procedure on 6/22. CT on 6/29 showed complex free fluid w/ air in the surgical bed concerning for superimposed infection vs bowel leak, she underwent R TG drain placement by Dr. Philip on 6/30.    CT on 7/5 showed additional intraabdominal fluid collection as well as retracted R TG drain, patient underwent R TG drain repositioning and RLQ drain placement on 7/7.    CT on 7/16 showed resolution of the fluid collection around the RLQ drain, injection on 7/18 confirmed no fistula, the RLQ drain was then removed. The R TG drain was not removed due to persistent output. Patient underwent R TG drain repositioning on 7/23, drain injection showed no fistula.   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 Was instructed to not flush and leave to gravity bag, which she has been doing since last visit. She was again seen in clinic for close follow-up on 8/13 at 8/27, at which visits the drain injections continued to demonstrate a patent fistulous tract, and patient was requested to maintain current treatment plan with abstinence from flushing the drain, recording daily output, and return in 2 weeks for repeat drain injection only. She met with her surgery team (Dr. Polly) on 9/2, who recommended drain retraction or removal if possible given persistent low output.    Pt presenting back to clinic today (9/10) for 2 week follow-up with repeat drain injection.  No fevers/chills or additional complaints. Patient feels well, at baseline, though eager to have her drain removed.  Past Medical History:  Diagnosis Date   Arthritis    Blood in stool    greater than 4 years since last incidence of blood in stool    Colon polyps    Glaucoma    Hydroureteronephrosis 07/2023   Hyperlipidemia    Hypertension    Inclusion cyst 2021   face   Perforation of sigmoid colon (HCC) 07/2023   Colitis   PNA (pneumonia) 2006   Sepsis (HCC) 07/2023   Strain of left wrist 04/16/2019    Past Surgical History:  Procedure Laterality Date   ABSCESS DRAINAGE  2021   face- inclusion cyst   BREAST BIOPSY Left 07/19/2018   US  guided breast bx with clip placement- benign   COLON RESECTION SIGMOID  07/30/2023   Procedure: COLECTOMY, SIGMOID, OPEN;  Surgeon: Polly Hacker LABOR, MD;  Location: [facility];  Service: General;;   CYSTOSCOPY W/ URETERAL STENT PLACEMENT Left 08/18/2023   Procedure: CYSTOSCOPY, WITH RETROGRADE PYELOGRAM AND URETERAL STENT INSERTION;  Surgeon: Lovie Arlyss CROME, MD;  Location: [facility];  Service: Urology;  Laterality: Left;   CYSTOSCOPY/URETEROSCOPY/HOLMIUM LASER/STENT PLACEMENT Left 09/26/2023   Procedure: CYSTOSCOPY/URETEROSCOPY/HOLMIUM LASER/STENT PLACEMENT;  Surgeon: Lovie Arlyss CROME, MD;  Location: [facility];  Service: Urology;  Laterality: Left;   EYE SURGERY  1956   HEMORRHOID SURGERY  2001   IR CATHETER TUBE CHANGE  08/30/2023   IR  CV LINE INJECTION  08/24/2023   IR RADIOLOGIST EVAL & MGMT  09/15/2023   IR RADIOLOGIST EVAL & MGMT  09/20/2023   LAPAROTOMY N/A 07/30/2023   Procedure: LAPAROTOMY, EXPLORATORY;  Surgeon: Polly Cordella LABOR, MD;  Location: [facility];  Service: General;  Laterality: N/A;   RADIOACTIVE SEED GUIDED EXCISIONAL BREAST BIOPSY Left 03/15/2019   Procedure: RADIOACTIVE SEED GUIDED EXCISIONAL LEFT  BREAST BIOPSY;  Surgeon: Gladis Cough, MD;  Location: [facility];  Service: General;  Laterality: Left;   TONSILLECTOMY  1963    WISDOM TOOTH EXTRACTION      Allergies: Codeine  Medications: Prior to Admission medications   Medication Sig Start Date End Date Taking? Authorizing Provider  acetaminophen  (TYLENOL ) 500 MG tablet Take 2 tablets (1,000 mg total) by mouth every 6 (six) hours as needed. 09/04/23   Perri LABOR Meliton Mickey., MD  albuterol  (VENTOLIN  HFA) 108 [phone] Base) MCG/ACT inhaler Inhale 2 puffs into the lungs every 6 (six) hours as needed for wheezing or shortness of breath. 03/03/22   Kuneff, Renee A, DO  carvedilol  (COREG ) 6.25 MG tablet Take 1 tablet (6.25 mg total) by mouth 2 (two) times daily with a meal. 09/06/23   Kuneff, Renee A, DO  celecoxib (CELEBREX) 100 MG capsule Take 100 mg by mouth 2 (two) times daily.    [provider]  ferrous sulfate  325 (65 FE) MG tablet Take 1 tablet (325 mg total) by mouth every other day. Follow your iron and anemia with your PCP outpatient 09/04/23 10/04/23  Perri LABOR Meliton Mickey., MD  Hyoscyamine  Sulfate SL (LEVSIN AMIEL) 0.125 MG SUBL Place 1 tablet (0.125 mg total) under the tongue every 6 (six) hours as needed (bladder spasms, stent discomfort). 09/26/23   Lovie Arlyss CROME, MD  ipratropium (ATROVENT ) 0.03 % nasal spray Place 2 sprays into both nostrils every 12 (twelve) hours. 09/30/23   Fleming, Zelda W, NP  latanoprost  (XALATAN ) 0.005 % ophthalmic solution Place 1 drop into both eyes at bedtime. 08/11/17   [provider]  lisinopril  (ZESTRIL ) 10 MG tablet Take 1 tablet (10 mg total) by mouth daily. 09/06/23   Kuneff, Renee A, DO  methocarbamol  (ROBAXIN ) 500 MG tablet Take 1 tablet (500 mg total) by mouth 2 (two) times daily as needed for muscle spasms. 09/06/23   Kuneff, Renee A, DO  Multiple Vitamin (MULTIVITAMIN) tablet Take 1 tablet by mouth daily.    [provider]  ondansetron  (ZOFRAN -ODT) 4 MG disintegrating tablet Take 1 tablet (4 mg total) by mouth every 8 (eight) hours as needed for nausea or vomiting. 09/06/23   Kuneff, Renee A, DO   pantoprazole  (PROTONIX ) 40 MG tablet Take 1 tablet (40 mg total) by mouth daily. 09/06/23   Kuneff, Renee A, DO  phenazopyridine (PYRIDIUM) 97 MG tablet Take 97 mg by mouth 3 (three) times daily. Patient not taking: Reported on 10/04/2023    [provider]  polyethylene glycol powder (GLYCOLAX /MIRALAX ) 17 GM/SCOOP powder Take 17 g by mouth daily. 09/04/23   Perri LABOR Meliton Mickey., MD  Probiotic Product (DAILY PROBIOTIC PO) Take 1 capsule by mouth daily at 12 noon. 09/19/23   [provider]  timolol  (TIMOPTIC ) 0.5 % ophthalmic solution Place 1 drop into both eyes daily. 11/14/19   [provider]     Family History  Problem Relation Age of Onset   Heart attack Father    Heart attack Sister    Hyperlipidemia Sister    Hypertension Sister     Social  History   Socioeconomic History   Marital status: Married    Spouse name: Not on file   Number of children: Not on file   Years of education: Not on file   Highest education level: Bachelor's degree (e.g., BA, AB, BS)  Occupational History   Not on file  Tobacco Use   Smoking status: Former    Current packs/day: 0.00    Types: Cigars, Cigarettes    Passive exposure: Past   Smokeless tobacco: Never   Tobacco comments:    *occasional smoker   Vaping Use   Vaping status: Former  Substance and Sexual Activity   Alcohol use: Not Currently    Alcohol/week: 4.0 standard drinks of alcohol    Types: 4 Glasses of wine per week    Comment: *rarely    Drug use: No   Sexual activity: Yes    Partners: Male    Comment: Married  Other Topics Concern   Not on file  Social History Narrative   Married. Retired. 2 children.   Bachelors degree.   Exercises routinely.   Drinks caffeine.   Smoke alarm in the home. Wears her seatbelt. Owns firearms.   Feels safe in her relationships.   Social Drivers of Health   Financial Resource Strain: Patient Declined (04/19/2023)   Overall Financial Resource Strain (CARDIA)     Difficulty of Paying Living Expenses: Patient declined  Food Insecurity: Unknown (09/05/2023)   Hunger Vital Sign    Worried About Running Out of Food in the Last Year: Never true    Ran Out of Food in the Last Year: Not on file  Transportation Needs: No Transportation Needs (09/05/2023)   PRAPARE - Administrator, Civil Service (Medical): No    Lack of Transportation (Non-Medical): No  Physical Activity: Sufficiently Active (04/19/2023)   Exercise Vital Sign    Days of Exercise per Week: 5 days    Minutes of Exercise per Session: 30 min  Stress: No Stress Concern Present (04/19/2023)   [employer] of Occupational Health - Occupational Stress Questionnaire    Feeling of Stress : Not at all  Social Connections: Patient Declined (08/02/2023)   Social Connection and Isolation Panel    Frequency of Communication with Friends and Family: Patient declined    Frequency of Social Gatherings with Friends and Family: Patient declined    Attends Religious Services: Patient declined    Database administrator or Organizations: Patient declined    Attends Banker Meetings: Patient declined    Marital Status: Patient declined    Review of Systems: A 12 point ROS discussed and pertinent positives are indicated in the HPI above.  All other systems are negative.    Vital Signs: There were no vitals taken for this visit.  Physical Exam Vitals and nursing note reviewed.  Constitutional:      Appearance: Normal appearance.  Pulmonary:     Effort: Pulmonary effort is normal.  Abdominal:     Palpations: Abdomen is soft.     Tenderness: There is no abdominal tenderness.  Skin:    General: Skin is warm and dry.     Comments: + R TG drain site clean/dry/intact. Mild pain around the inferior and medial aspect of the drain site. ~5cc of tan output in gravity bag (total from the last 48h)  Neurological:     Mental Status: She is alert and oriented to person, place, and time.  Mental status is at baseline.  Mallampati Score:     Imaging: DG Sinus/Fist Tube Chk-Non GI Result Date: 10/04/2023 CLINICAL DATA:  73 year old female with fistulous tract formation at site of transgluteal drain in place secondary to ischemic colitis with perforation and subsequent HM colostomy creation. EXAM: ABSCESS INJECTION COMPARISON:  DG Sinus/Fist Tube Chk on 8/13; DG Sinus/Fist Tube Chk on 8/8; CT Abdomen Pelvis w/ Contrast on 8/8. CONTRAST:  7 ml of Isovue  300 - administered via the existing percutaneous drain. FLUOROSCOPY: Radiation Exposure Index: 3.0 mGy TECHNIQUE: The patient was positioned prone on the fluoroscopy table. A preprocedural spot fluoroscopic image was obtained of the pelvic fluid collection and the existing percutaneous drainage catheter. Multiple spot fluoroscopic and radiographic images were obtained following the injection of a small amount of contrast via the existing percutaneous drainage catheter. Patient tolerated the procedure well. FINDINGS: Persistence of fistulous tract to bowel is re-demonstrated. IMPRESSION: Persistence of fistulous tract to bowel continues to be demonstrated. Performed by: Carlin Griffon, PA-C Electronically Signed   By: Cordella Banner   On: 10/04/2023 14:08   DG C-Arm 1-60 Min-No Report Result Date: 09/26/2023 Fluoroscopy was utilized by the requesting physician.  No radiographic interpretation.   DG Sinus/Fist Tube Chk-Non GI Result Date: 09/20/2023 INDICATION: Trans gluteal pelvic abscess drain, localized pain EXAM: FLUOROSCOPIC INJECTION OF THE TRANS GLUTEAL ABSCESS DRAIN MEDICATIONS: NONE. ANESTHESIA/SEDATION: None. COMPLICATIONS: None immediate. PROCEDURE: Informed written consent was obtained from the patient after a thorough discussion of the procedural risks, benefits and alternatives. All questions were addressed. Maximal Sterile Barrier Technique was utilized including caps, mask, sterile gowns, sterile gloves, sterile drape,  hand hygiene and skin antiseptic. A timeout was performed prior to the initiation of the procedure. Under fluoroscopy, the existing abscess drain was injected with contrast. Stable drain catheter position. Similar collapsed abscess cavity. 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 No interval change compared to 09/15/2023. IMPRESSION: Stable trans gluteal abscess drain with a persistent patent fistula to the rectum. PLAN: Drain catheter to remain. No further flushing. Abscess drain remains to gravity drainage. Repeat injection in 2 weeks. Electronically Signed   By: CHRISTELLA.  Shick M.D.   On: 09/20/2023 08:46   IR Radiologist Eval & Mgmt Result Date: 09/20/2023 : Please see the dictated noted in [facility] from today for this IR rad eval and management. Electronically Signed   By: CHRISTELLA.  Shick M.D.   On: 09/20/2023 08:38    Labs:  CBC: Recent Labs    09/01/23 0324 09/02/23 0350 09/04/23 0340 09/06/23 1139  WBC 6.1 5.3 5.9 6.3  HGB 10.3* 9.9* 9.8* 11.1*  HCT 31.9* 31.1* 31.3* 34.3*  PLT 328 305 312 381.0    COAGS: Recent Labs    07/29/23 0826 08/07/23 0909 08/15/23 0431  INR 1.1 1.2 1.1    BMP: Recent Labs    08/31/23 0358 09/01/23 0324 09/02/23 0350 09/04/23 0340 09/06/23 1139  NA 137 137 136 137 137  K 4.1 3.6 3.9 3.4* 4.0  CL 102 103 102 103 99  CO2 26 27 25 27 30   GLUCOSE 108* 102* 94 94 99  BUN 23 20 16 13 17   CALCIUM  8.4* 8.6* 8.4* 8.5* 8.7  CREATININE 0.50 0.55 0.55 0.49 0.56  GFRNONAA >60 >60 >60 >60  --     LIVER FUNCTION TESTS: Recent Labs    08/31/23 0358 09/01/23 0324 09/02/23 0350 09/06/23 1139  BILITOT 0.5 0.6 0.3 0.3  AST 15 14* 14* 19  ALT 11 12 11 21   ALKPHOS 63 70  59 81  PROT 5.2* 5.3* 5.1* 6.0  ALBUMIN  2.0* 2.0* 2.1* 3.4*    TUMOR MARKERS: No results for input(s): AFPTM, CEA, CA199, CHROMGRNA in the last 8760 hours.  Assessment:  73 year old female hx of colitis with ischemia and perforated colon s/p ex lap and Hartmann's by Dr.  Polly procedure on 6/22, underwent R TG and RLQ drain placement for post op intraabdominal fluid collections. RLQ drain was removed on 7/17. She presents to the clinic today for continued R TG drain follow up.  Last seen 2 weeks ago for similar drain injection, found to have persistent fistulous connection to bowel.  Patient continues to not flush her drain, as instructed, and has been to gravity bag since last seen, with negligible output over the last 2 weeks.  Pt saw her surgery team on 9/2 with recommendation for drain retraction or removal if possible given persistently low output. Today gravity bag with roughly 5cc worth of output from the last 48h.  Drain injection today shows persistent small fistulous connection to distal colon/rectum. Imaging reviewed with Dr. Philip and given location of drain where colon resection and ostomy was completed during [facility] procedure, there is no colon material flowing through to distal colon/rectum and given low output (~5cc over last 2 days), pt was given option/recommendations of either drain retraction and follow up again at clinic or have the drain pulled today. Pt opted to have the drain removed. Risks of removal of drain were given including change or recurrence of abscess/fluid collection and need for replacement of drain or additional surgical procedures. Pt verbalized understanding and still wanted to proceed with drain removal. Drain was then removed by IR tech at clinic today. Pt encouraged to follow any new symptoms closely and reach out with any changes in the future.      Electronically Signed: Kimble DEL Megon Kalina PA-C 10/18/2023, 10:02 AM   Please refer to Dr. Ebbie attestation of this note for management and plan.   I spent a total of   25 Minutes in face to face in clinical consultation, greater than 50% of which was counseling/coordinating care for Right transgluteal drain follow up

## 2023-10-19 DIAGNOSIS — N136 Pyonephrosis: Secondary | ICD-10-CM | POA: Diagnosis not present

## 2023-10-19 DIAGNOSIS — T85528D Displacement of other gastrointestinal prosthetic devices, implants and grafts, subsequent encounter: Secondary | ICD-10-CM | POA: Diagnosis not present

## 2023-10-19 DIAGNOSIS — T8143XD Infection following a procedure, organ and space surgical site, subsequent encounter: Secondary | ICD-10-CM | POA: Diagnosis not present

## 2023-10-19 DIAGNOSIS — D649 Anemia, unspecified: Secondary | ICD-10-CM | POA: Diagnosis not present

## 2023-10-19 DIAGNOSIS — K9189 Other postprocedural complications and disorders of digestive system: Secondary | ICD-10-CM | POA: Diagnosis not present

## 2023-10-19 DIAGNOSIS — E872 Acidosis, unspecified: Secondary | ICD-10-CM | POA: Diagnosis not present

## 2023-10-19 DIAGNOSIS — I2510 Atherosclerotic heart disease of native coronary artery without angina pectoris: Secondary | ICD-10-CM | POA: Diagnosis not present

## 2023-10-19 DIAGNOSIS — N739 Female pelvic inflammatory disease, unspecified: Secondary | ICD-10-CM | POA: Diagnosis not present

## 2023-10-19 DIAGNOSIS — I119 Hypertensive heart disease without heart failure: Secondary | ICD-10-CM | POA: Diagnosis not present

## 2023-10-20 ENCOUNTER — Ambulatory Visit (HOSPITAL_COMMUNITY)
Admission: RE | Admit: 2023-10-20 | Discharge: 2023-10-20 | Disposition: A | Discharge status: Home / Self Care | Source: Ambulatory Visit | Attending: Family Medicine | Admitting: Family Medicine

## 2023-10-20 DIAGNOSIS — Z933 Colostomy status: Secondary | ICD-10-CM | POA: Insufficient documentation

## 2023-10-20 DIAGNOSIS — Z433 Encounter for attention to colostomy: Secondary | ICD-10-CM | POA: Diagnosis not present

## 2023-10-20 NOTE — Progress Notes (Signed)
 [facility]   Reason for visit:  LLQ colostomy HPI:  Stercoral colitis with end colostomy Past Medical History:  Diagnosis Date   Arthritis    Blood in stool    greater than 4 years since last incidence of blood in stool    Colon polyps    Glaucoma    Hydroureteronephrosis 07/2023   Hyperlipidemia    Hypertension    Inclusion cyst 2021   face   Perforation of sigmoid colon (HCC) 07/2023   Colitis   PNA (pneumonia) 2006   Sepsis (HCC) 07/2023   Strain of left wrist 04/16/2019   Family History  Problem Relation Age of Onset   Heart attack Father    Heart attack Sister    Hyperlipidemia Sister    Hypertension Sister    Allergies  Allergen Reactions   Codeine Other (See Comments)    Irritable , insomnia    Current Outpatient Medications  Medication Sig Dispense Refill Last Dose/Taking   acetaminophen  (TYLENOL ) 500 MG tablet Take 2 tablets (1,000 mg total) by mouth every 6 (six) hours as needed.      albuterol  (VENTOLIN  HFA) 108 (90 Base) MCG/ACT inhaler Inhale 2 puffs into the lungs every 6 (six) hours as needed for wheezing or shortness of breath. 8 g 0    carvedilol  (COREG ) 6.25 MG tablet Take 1 tablet (6.25 mg total) by mouth 2 (two) times daily with a meal. 60 tablet 5    celecoxib (CELEBREX) 100 MG capsule Take 100 mg by mouth 2 (two) times daily.      ferrous sulfate  325 (65 FE) MG tablet Take 1 tablet (325 mg total) by mouth every other day. Follow your iron and anemia with your PCP outpatient 15 tablet 0    Hyoscyamine  Sulfate SL (LEVSIN /SL) 0.125 MG SUBL Place 1 tablet (0.125 mg total) under the tongue every 6 (six) hours as needed (bladder spasms, stent discomfort). 20 tablet 1    ipratropium (ATROVENT ) 0.03 % nasal spray Place 2 sprays into both nostrils every 12 (twelve) hours. 30 mL 0    latanoprost  (XALATAN ) 0.005 % ophthalmic solution Place 1 drop into both eyes at bedtime.  4    lisinopril  (ZESTRIL ) 10 MG tablet Take 1 tablet (10 mg total) by  mouth daily. 90 tablet 1    methocarbamol  (ROBAXIN ) 500 MG tablet Take 1 tablet (500 mg total) by mouth 2 (two) times daily as needed for muscle spasms. 60 tablet 1    Multiple Vitamin (MULTIVITAMIN) tablet Take 1 tablet by mouth daily.      ondansetron  (ZOFRAN -ODT) 4 MG disintegrating tablet Take 1 tablet (4 mg total) by mouth every 8 (eight) hours as needed for nausea or vomiting. 20 tablet 0    pantoprazole  (PROTONIX ) 40 MG tablet Take 1 tablet (40 mg total) by mouth daily. 90 tablet 1    phenazopyridine (PYRIDIUM) 97 MG tablet Take 97 mg by mouth 3 (three) times daily. (Patient not taking: Reported on 10/04/2023)      polyethylene glycol powder (GLYCOLAX /MIRALAX ) 17 GM/SCOOP powder Take 17 g by mouth daily. 238 g 0    Probiotic Product (DAILY PROBIOTIC PO) Take 1 capsule by mouth daily at 12 noon.      timolol  (TIMOPTIC ) 0.5 % ophthalmic solution Place 1 drop into both eyes daily.      No current facility-administered medications for this encounter.   ROS  Review of Systems  Constitutional:  Positive for appetite change and fatigue.  Eyes:  Hx glaucoma  son helps with care due to visual disturbance  Gastrointestinal:  Positive for constipation.       LLQ colostomy  Musculoskeletal:  Positive for arthralgias.  Psychiatric/Behavioral: Negative.    All other systems reviewed and are negative.  Vital signs:  BP 128/77 (BP Location: Right Arm)   Pulse 66   Temp 98.4 F (36.9 C) (Oral)   Resp 18   SpO2 96%  Exam:  Physical Exam Vitals reviewed.  Constitutional:      Appearance: Normal appearance.  HENT:     Mouth/Throat:     Mouth: Mucous membranes are moist.  Cardiovascular:     Rate and Rhythm: Normal rate.     Pulses: Normal pulses.  Pulmonary:     Effort: Pulmonary effort is normal.  Abdominal:     Palpations: Abdomen is soft.  Musculoskeletal:        General: Normal range of motion.  Skin:    General: Skin is warm and dry.  Neurological:     Mental Status:  She is alert and oriented to person, place, and time. Mental status is at baseline.  Psychiatric:        Mood and Affect: Mood normal.        Behavior: Behavior normal.     Stoma type/location:  LLQ colostomy Stomal assessment/size:  1 3/8 pink and moist Peristomal assessment:  intact Treatment options for stomal/peristomal skin: barrier ring and 2 piece pouch  Output: soft brown stool  Ostomy pouching: 2pc.2 3/4   Education provided:  Is set up with [facility]     Impression/dx  colostomy Discussion  See back as needed.  Plan  Call clinic with ongoing issues.     Visit time: 35 minutes.   Darice Cooley FNP-BC

## 2023-10-24 ENCOUNTER — Telehealth: Payer: Self-pay

## 2023-10-24 DIAGNOSIS — K921 Melena: Secondary | ICD-10-CM

## 2023-10-24 DIAGNOSIS — Z433 Encounter for attention to colostomy: Secondary | ICD-10-CM

## 2023-10-24 DIAGNOSIS — K5904 Chronic idiopathic constipation: Secondary | ICD-10-CM

## 2023-10-24 DIAGNOSIS — K631 Perforation of intestine (nontraumatic): Secondary | ICD-10-CM

## 2023-10-24 NOTE — Telephone Encounter (Signed)
 Communication  Reason for CRM: Patient called in would like for the nurse to give her a callback regading some questions that she has   Called and spoke with pt. Pt currently goes to [facility] but states after her last visit, she no longer would like be seen there. Pt requesting GI referral. Pt has no preference of place. Please advise.

## 2023-10-25 NOTE — Telephone Encounter (Signed)
 I can certainly place a new referral for her to a different location.  However I do need some information from her first. When was the next time she was needing to see the gastroenterologist?  (For example: When the day tell her to follow-up last)  Is she needing a general referral for GI or a more urgent referral placed for follow-up after she has had the surgery?  Will need to know the answers so we can place the appropriate diagnoses on the referral.

## 2023-10-26 DIAGNOSIS — N739 Female pelvic inflammatory disease, unspecified: Secondary | ICD-10-CM | POA: Diagnosis not present

## 2023-10-26 DIAGNOSIS — K9189 Other postprocedural complications and disorders of digestive system: Secondary | ICD-10-CM | POA: Diagnosis not present

## 2023-10-26 DIAGNOSIS — N136 Pyonephrosis: Secondary | ICD-10-CM | POA: Diagnosis not present

## 2023-10-26 DIAGNOSIS — T85528D Displacement of other gastrointestinal prosthetic devices, implants and grafts, subsequent encounter: Secondary | ICD-10-CM | POA: Diagnosis not present

## 2023-10-26 DIAGNOSIS — I119 Hypertensive heart disease without heart failure: Secondary | ICD-10-CM | POA: Diagnosis not present

## 2023-10-26 DIAGNOSIS — D649 Anemia, unspecified: Secondary | ICD-10-CM | POA: Diagnosis not present

## 2023-10-26 DIAGNOSIS — T8143XD Infection following a procedure, organ and space surgical site, subsequent encounter: Secondary | ICD-10-CM | POA: Diagnosis not present

## 2023-10-26 DIAGNOSIS — E872 Acidosis, unspecified: Secondary | ICD-10-CM | POA: Diagnosis not present

## 2023-10-26 DIAGNOSIS — I2510 Atherosclerotic heart disease of native coronary artery without angina pectoris: Secondary | ICD-10-CM | POA: Diagnosis not present

## 2023-10-26 NOTE — Telephone Encounter (Signed)
 Spoke with patient regarding results/recommendations. Pt was not aware that she needed to be seen but got notification that she had an appt scheduled and cancelled it. She states that it is not an emergency that she be seen because she is fine but she thinks it was just a follow up from procedure.

## 2023-10-26 NOTE — Addendum Note (Signed)
 Addended by: Naryiah Schley A on: 10/26/2023 11:58 AM   Modules accepted: Orders

## 2023-10-26 NOTE — Telephone Encounter (Signed)
Referral placed to [facility].  

## 2023-10-31 MED ORDER — FERROUS SULFATE 325 (65 FE) MG PO TABS: 325.0000 mg | ORAL_TABLET | ORAL | 0 refills | Status: AC

## 2023-10-31 NOTE — Telephone Encounter (Signed)
 Communication  Reason for CRM: Patient called in stated she has some question regarding her upcoming GI appointment, would like for [facility] nurse to give her a callback   Spoke with pt. Pt stated [facility] had not called yet to schedule. Information given for pt to call. Pt also stated she had ran out of the iron supplement prescribed while she was in the hospital. Script has no further refills. Please advise if okay to refill.

## 2023-10-31 NOTE — Addendum Note (Signed)
 Addended by: Ladona Rosten A on: 10/31/2023 03:44 PM   Modules accepted: Orders

## 2023-10-31 NOTE — Telephone Encounter (Addendum)
 She can continue the iron supplement every other day/3 times a week for another month.  She should not need to continue it after that time.  Refilled for 30 days

## 2023-11-01 ENCOUNTER — Telehealth (INDEPENDENT_AMBULATORY_CARE_PROVIDER_SITE_OTHER): Admitting: Internal Medicine

## 2023-11-01 ENCOUNTER — Other Ambulatory Visit: Payer: Self-pay

## 2023-11-01 DIAGNOSIS — N390 Urinary tract infection, site not specified: Secondary | ICD-10-CM

## 2023-11-01 NOTE — Progress Notes (Signed)
 Virtual Visit via Telephone/Video Note   I connected with Arland GORMAN March   On 11/01/2023 at 10:36 AM  by Video and verified that I am speaking with the correct person using two identifiers.   I discussed the limitations, risks, security and privacy concerns of performing an evaluation and management service by telephone and the availability of in person appointments. I also discussed with the patient that there may be a patient responsible charge related to this service. The patient expressed understanding and agreed to proceed.   Location:   Patient: Home Provider: [facility]  Unable to connect via video visit.  Spoke to patient over the phone she noted drain was out.  She has been doing well since drains been out on /10.  She did have 2 episodes of fluid from rectum that seem consistent draining fluid.  Last episode was about 2 weeks ago.  No fevers or chills, urinary symptoms.  Doing well otherwise.  Denies any abdominal pain at this time.  She plans on following up with the surgery given fistula at time of drain removal noted to Hartman's pouch.  Counseled patient that if she develops fevers chills worsening pain then go to the ED and inform clinic.

## 2023-11-02 ENCOUNTER — Telehealth: Payer: Self-pay | Admitting: Gastroenterology

## 2023-11-02 DIAGNOSIS — N739 Female pelvic inflammatory disease, unspecified: Secondary | ICD-10-CM | POA: Diagnosis not present

## 2023-11-02 DIAGNOSIS — E872 Acidosis, unspecified: Secondary | ICD-10-CM | POA: Diagnosis not present

## 2023-11-02 DIAGNOSIS — N136 Pyonephrosis: Secondary | ICD-10-CM | POA: Diagnosis not present

## 2023-11-02 DIAGNOSIS — D649 Anemia, unspecified: Secondary | ICD-10-CM | POA: Diagnosis not present

## 2023-11-02 DIAGNOSIS — K9189 Other postprocedural complications and disorders of digestive system: Secondary | ICD-10-CM | POA: Diagnosis not present

## 2023-11-02 DIAGNOSIS — T8143XD Infection following a procedure, organ and space surgical site, subsequent encounter: Secondary | ICD-10-CM | POA: Diagnosis not present

## 2023-11-02 DIAGNOSIS — I119 Hypertensive heart disease without heart failure: Secondary | ICD-10-CM | POA: Diagnosis not present

## 2023-11-02 DIAGNOSIS — T85528D Displacement of other gastrointestinal prosthetic devices, implants and grafts, subsequent encounter: Secondary | ICD-10-CM | POA: Diagnosis not present

## 2023-11-02 DIAGNOSIS — I2510 Atherosclerotic heart disease of native coronary artery without angina pectoris: Secondary | ICD-10-CM | POA: Diagnosis not present

## 2023-11-02 NOTE — Telephone Encounter (Signed)
 Inbound call from this patient requesting to transfer her care over to our practice due to her not receiving the attention she needs and was pushes to take [facility]. Patient saw [facility] twice this year back in May and June. Patient informed me that she had a bowel resection due to her being admitted to the ED for a bowel perforation back in July 30, 2023. Patient will have her records faxed over from [facility]. Please advise.

## 2023-11-03 DIAGNOSIS — K5732 Diverticulitis of large intestine without perforation or abscess without bleeding: Secondary | ICD-10-CM | POA: Diagnosis not present

## 2023-11-03 DIAGNOSIS — Z933 Colostomy status: Secondary | ICD-10-CM | POA: Diagnosis not present

## 2023-11-08 ENCOUNTER — Telehealth: Payer: Self-pay | Admitting: Gastroenterology

## 2023-11-08 NOTE — Telephone Encounter (Signed)
 Good afternoon Dr. Stacia,    DOD of the PM,  I received a call from this patient requesting to transfer her care of to our practice due to her believing that she is not receiving the adequate care over with [facility]. Patient informed that she was seen twice with [facility] this year due to her abdominal pain they pushed her into taking Linzess which was not what she needed at that time. Patient was later seen in the ER back in June due to her having a Bowel perforation that resulted in having her bowel resected. Patient had her colonoscopy records and pathology faxed over to us . Records can be view in epic under the media tab. Would you please advise on how to schedule this patient.    Thank you.

## 2023-11-20 DIAGNOSIS — N200 Calculus of kidney: Secondary | ICD-10-CM | POA: Diagnosis not present

## 2023-11-20 DIAGNOSIS — R399 Unspecified symptoms and signs involving the genitourinary system: Secondary | ICD-10-CM | POA: Diagnosis not present

## 2023-11-29 ENCOUNTER — Telehealth: Payer: Self-pay

## 2023-11-29 ENCOUNTER — Ambulatory Visit (INDEPENDENT_AMBULATORY_CARE_PROVIDER_SITE_OTHER)

## 2023-11-29 DIAGNOSIS — Z23 Encounter for immunization: Secondary | ICD-10-CM

## 2023-11-29 NOTE — Telephone Encounter (Signed)
 Please call patient and ask her the dates of travel missed, surrounding this form.

## 2023-11-29 NOTE — Progress Notes (Signed)
 Pt in for high dose flu vaccine.  Injection tolerated well  Vaccine handout given to pt.

## 2023-11-29 NOTE — Telephone Encounter (Signed)
 Pts missed days were 09/22/23-09/29/23

## 2023-11-29 NOTE — Telephone Encounter (Signed)
 Pt in for flu vaccine today and brought a release form that needs to be completed. Pt was scheduled to go on a cruise but was unable to go due to being hospitalized. In order for her to get a refund she will need this form completed. Pt did schedule an office visit for 10/24. My portion of the form completed on placed on pcp desk.

## 2023-12-01 ENCOUNTER — Ambulatory Visit (INDEPENDENT_AMBULATORY_CARE_PROVIDER_SITE_OTHER): Admitting: Family Medicine

## 2023-12-01 VITALS — BP 128/74 | HR 75 | Temp 97.9°F | Wt 121.8 lb

## 2023-12-01 DIAGNOSIS — Z433 Encounter for attention to colostomy: Secondary | ICD-10-CM

## 2023-12-01 DIAGNOSIS — N200 Calculus of kidney: Secondary | ICD-10-CM | POA: Diagnosis not present

## 2023-12-01 DIAGNOSIS — K631 Perforation of intestine (nontraumatic): Secondary | ICD-10-CM | POA: Diagnosis not present

## 2023-12-01 DIAGNOSIS — Z1621 Resistance to vancomycin: Secondary | ICD-10-CM | POA: Diagnosis not present

## 2023-12-01 DIAGNOSIS — A491 Streptococcal infection, unspecified site: Secondary | ICD-10-CM | POA: Diagnosis not present

## 2023-12-01 LAB — CBC WITH DIFFERENTIAL/PLATELET
Basophils Absolute: 0 K/uL (ref 0.0–0.1)
Basophils Relative: 0.5 % (ref 0.0–3.0)
Eosinophils Absolute: 0.3 K/uL (ref 0.0–0.7)
Eosinophils Relative: 4.4 % (ref 0.0–5.0)
HCT: 42.9 % (ref 36.0–46.0)
Hemoglobin: 14.2 g/dL (ref 12.0–15.0)
Lymphocytes Relative: 33.6 % (ref 12.0–46.0)
Lymphs Abs: 2.3 K/uL (ref 0.7–4.0)
MCHC: 33 g/dL (ref 30.0–36.0)
MCV: 90.5 fl (ref 78.0–100.0)
Monocytes Absolute: 0.5 K/uL (ref 0.1–1.0)
Monocytes Relative: 7.4 % (ref 3.0–12.0)
Neutro Abs: 3.7 K/uL (ref 1.4–7.7)
Neutrophils Relative %: 54.1 % (ref 43.0–77.0)
Platelets: 353 K/uL (ref 150.0–400.0)
RBC: 4.74 Mil/uL (ref 3.87–5.11)
RDW: 15.1 % (ref 11.5–15.5)
WBC: 6.9 K/uL (ref 4.0–10.5)

## 2023-12-01 LAB — COMPREHENSIVE METABOLIC PANEL WITH GFR
ALT: 12 U/L (ref 0–35)
AST: 13 U/L (ref 0–37)
Albumin: 4.2 g/dL (ref 3.5–5.2)
Alkaline Phosphatase: 85 U/L (ref 39–117)
BUN: 20 mg/dL (ref 6–23)
CO2: 30 meq/L (ref 19–32)
Calcium: 9.4 mg/dL (ref 8.4–10.5)
Chloride: 103 meq/L (ref 96–112)
Creatinine, Ser: 0.61 mg/dL (ref 0.40–1.20)
GFR: 90.19 mL/min (ref >60.00)
Glucose, Bld: 87 mg/dL (ref 70–99)
Potassium: 4.5 meq/L (ref 3.5–5.1)
Sodium: 142 meq/L (ref 135–145)
Total Bilirubin: 0.3 mg/dL (ref 0.2–1.2)
Total Protein: 6.6 g/dL (ref 6.0–8.3)

## 2023-12-01 NOTE — Progress Notes (Signed)
 Angela [name] , [birth year], 73 y.o., female MRN: [id number] Patient Care Team    Relationship Specialty Notifications Start End  Catherine Charlies LABOR, DO PCP - General Family Medicine  04/10/17   Lovie Arlyss CROME, MD Consulting Physician Urology  09/06/23   Polly Cordella LABOR, MD Consulting Physician General Surgery  09/06/23   Stacia Glendia BRAVO, MD Consulting Physician Gastroenterology  12/04/23     Chief Complaint  Patient presents with   Follow-up    ureteral stone procedure     Subjective: Angela [name] is a 73 y.o. Pt presents for an OV to follow-up after 2 recent hospitalizations secondary to bowel perforation requiring partial bowel resection and colostomy admission in July 30, 2023 through September 04, 2023.  Patient is then found to have significant vancomycin-resistant Enterococcus UTI infection requiring infectious disease consult and urology.  Was found to have a significant stone burden and was readmitted on September 26, 2023 underwent cystoscopy/ureteroscopy, laser, stent placement. Over this time patient has been nonambulatory secondary to bowel perforation and colostomy surgery and pain.  She was requiring dressing changes 3 times daily.  She reports she has been seen by urology recently and her urine was normal last week.  She has an appointment with surgeon on December 4 to talk about colostomy reversal and gastroenterology on December 5.     06/23/2023    1:12 PM 04/19/2023    8:57 AM 02/22/2023    7:48 AM 04/13/2022    8:37 AM 03/24/2021    1:49 PM  Depression screen PHQ 2/9  Decreased Interest 0 0 0 0 0  Down, Depressed, Hopeless 0 0 0 0 0  PHQ - 2 Score 0 0 0 0 0  Altered sleeping 0 0     Tired, decreased energy 0 0     Change in appetite 0 0     Feeling bad or failure about yourself  0 0     Trouble concentrating 0 0     Moving slowly or fidgety/restless 0 0     Suicidal thoughts 0 0     PHQ-9 Score 0 0     Difficult doing work/chores Not difficult at all Not  difficult at all       Allergies  Allergen Reactions   Codeine Other (See Comments)    Irritable , insomnia    Social History   Social History Narrative   Married. Retired. 2 children.   Bachelors degree.   Exercises routinely.   Drinks caffeine.   Smoke alarm in the home. Wears her seatbelt. Owns firearms.   Feels safe in her relationships.   Past Medical History:  Diagnosis Date   Arthritis    Blood in stool    greater than 4 years since last incidence of blood in stool    Colon polyps    Glaucoma    Hydroureteronephrosis 07/2023   Hyperlipidemia    Hypertension    Inclusion cyst 2021   face   Perforation of sigmoid colon (HCC) 07/2023   Colitis   PNA (pneumonia) 2006   Sepsis (HCC) 07/2023   Strain of left wrist 04/16/2019   Past Surgical History:  Procedure Laterality Date   ABSCESS DRAINAGE  2021   face- inclusion cyst   BREAST BIOPSY Left 07/19/2018   US  guided breast bx with clip placement- benign   COLON RESECTION SIGMOID  07/30/2023   Procedure: COLECTOMY, SIGMOID, OPEN;  Surgeon: Polly Cordella LABOR, MD;  Location: [facility];  Service: General;;   CYSTOSCOPY W/ URETERAL STENT PLACEMENT Left 08/18/2023   Procedure: CYSTOSCOPY, WITH RETROGRADE PYELOGRAM AND URETERAL STENT INSERTION;  Surgeon: Lovie Arlyss CROME, MD;  Location: [facility];  Service: Urology;  Laterality: Left;   CYSTOSCOPY/URETEROSCOPY/HOLMIUM LASER/STENT PLACEMENT Left 09/26/2023   Procedure: CYSTOSCOPY/URETEROSCOPY/HOLMIUM LASER/STENT PLACEMENT;  Surgeon: Lovie Arlyss CROME, MD;  Location: [facility];  Service: Urology;  Laterality: Left;   EYE SURGERY  1956   HEMORRHOID SURGERY  2001   IR CATHETER TUBE CHANGE  08/30/2023   IR CV LINE INJECTION  08/24/2023   IR RADIOLOGIST EVAL & MGMT  09/15/2023   IR RADIOLOGIST EVAL & MGMT  09/20/2023   IR RADIOLOGIST EVAL & MGMT  10/18/2023   IR RADIOLOGIST EVAL & MGMT  10/04/2023   LAPAROTOMY N/A 07/30/2023   Procedure: LAPAROTOMY, EXPLORATORY;  Surgeon: Polly Cordella LABOR,  MD;  Location: [facility];  Service: General;  Laterality: N/A;   RADIOACTIVE SEED GUIDED EXCISIONAL BREAST BIOPSY Left 03/15/2019   Procedure: RADIOACTIVE SEED GUIDED EXCISIONAL LEFT  BREAST BIOPSY;  Surgeon: Gladis Cough, MD;  Location: [facility];  Service: General;  Laterality: Left;   TONSILLECTOMY  1963   WISDOM TOOTH EXTRACTION     Family History  Problem Relation Age of Onset   Heart attack Father    Heart attack Sister    Hyperlipidemia Sister    Hypertension Sister    Allergies as of 12/01/2023       Reactions   Codeine Other (See Comments)   Irritable , insomnia         Medication List        Accurate as of December 01, 2023 11:59 PM. If you have any questions, ask your nurse or doctor.          acetaminophen  500 MG tablet Commonly known as: TYLENOL  Take 2 tablets (1,000 mg total) by mouth every 6 (six) hours as needed.   albuterol  108 (90 Base) MCG/ACT inhaler Commonly known as: VENTOLIN  HFA Inhale 2 puffs into the lungs every 6 (six) hours as needed for wheezing or shortness of breath.   carvedilol  6.25 MG tablet Commonly known as: COREG  Take 1 tablet (6.25 mg total) by mouth 2 (two) times daily with a meal.   celecoxib 100 MG capsule Commonly known as: CELEBREX Take 100 mg by mouth 2 (two) times daily.   DAILY PROBIOTIC PO Take 1 capsule by mouth daily at 12 noon.   ferrous sulfate  325 (65 FE) MG tablet Take 1 tablet (325 mg total) by mouth every other day. Follow your iron and anemia with your PCP outpatient   Hyoscyamine  Sulfate SL 0.125 MG Subl Commonly known as: Levsin /SL Place 1 tablet (0.125 mg total) under the tongue every 6 (six) hours as needed (bladder spasms, stent discomfort).   ipratropium 0.03 % nasal spray Commonly known as: ATROVENT  Place 2 sprays into both nostrils every 12 (twelve) hours.   latanoprost  0.005 % ophthalmic solution Commonly known as: XALATAN  Place 1 drop into both eyes at bedtime.   lisinopril   10 MG tablet Commonly known as: ZESTRIL  Take 1 tablet (10 mg total) by mouth daily.   multivitamin tablet Take 1 tablet by mouth daily.   pantoprazole  40 MG tablet Commonly known as: PROTONIX  Take 1 tablet (40 mg total) by mouth daily.   polyethylene glycol powder 17 GM/SCOOP powder Commonly known as: GLYCOLAX /MIRALAX  Take 17 g by mouth daily.   timolol  0.5 % ophthalmic solution Commonly known as:  TIMOPTIC  Place 1 drop into both eyes daily.        All past medical history, surgical history, allergies, family history, immunizations andmedications were updated in the EMR today and reviewed under the history and medication portions of their EMR.     ROS Negative, with the exception of above mentioned in HPI   Objective:  BP 128/74   Pulse 75   Temp 97.9 F (36.6 C)   Wt 121 lb 12.8 oz (55.2 kg)   SpO2 98%   BMI 20.91 kg/m  Body mass index is 20.91 kg/m. Physical Exam Vitals and nursing note reviewed.  Constitutional:      General: She is not in acute distress.    Appearance: Normal appearance. She is not ill-appearing, toxic-appearing or diaphoretic.  HENT:     Head: Normocephalic and atraumatic.  Eyes:     General: No scleral icterus.       Right eye: No discharge.        Left eye: No discharge.     Extraocular Movements: Extraocular movements intact.     Conjunctiva/sclera: Conjunctivae normal.     Pupils: Pupils are equal, round, and reactive to light.  Cardiovascular:     Rate and Rhythm: Normal rate and regular rhythm.  Pulmonary:     Effort: Pulmonary effort is normal. No respiratory distress.     Breath sounds: Normal breath sounds. No wheezing, rhonchi or rales.  Abdominal:     General: Bowel sounds are normal.     Palpations: Abdomen is soft.     Tenderness: There is no abdominal tenderness. There is no guarding or rebound.     Comments: Colostomy in place.  Stoma appears healthy.  Musculoskeletal:     Right lower leg: No edema.     Left lower  leg: No edema.  Skin:    General: Skin is warm.     Findings: No rash.  Neurological:     Mental Status: She is alert and oriented to person, place, and time. Mental status is at baseline.     Motor: No weakness.     Gait: Gait normal.  Psychiatric:        Mood and Affect: Mood normal.        Behavior: Behavior normal.        Thought Content: Thought content normal.        Judgment: Judgment normal.      No results found. No results found. No results found for this or any previous visit (from the past 24 hours).  Assessment/Plan: Angela [name] is a 73 y.o. female present for OV for  Perforated sigmoid colon (HCC) (Primary)/Colostomy care [facility]) Recovering well.  Colostomy bag in place and stoma appears healthy. She has her follow-ups with GI December 5 and her surgical team on December 4 to discuss colostomy reversal. - CBC w/Diff - Comp Met (CMET)  Kidney stone/Vancomycin resistant Enterococcus infection Following with urology status post stent placement  Reviewed expectations re: course of current medical issues. Discussed self-management of symptoms. Outlined signs and symptoms indicating need for more acute intervention. Patient verbalized understanding and all questions were answered. Patient received an After-Visit Summary.    Orders Placed This Encounter  Procedures   CBC w/Diff   Comp Met (CMET)   No orders of the defined types were placed in this encounter.  Referral Orders  No referral(s) requested today     Note is dictated utilizing voice recognition software. Although note has been proof  read prior to signing, occasional typographical errors still can be missed. If any questions arise, please do not hesitate to call for verification.   electronically signed by:  Charlies Bellini, DO  [facility] - [place]

## 2023-12-04 ENCOUNTER — Ambulatory Visit: Payer: Self-pay | Admitting: Family Medicine

## 2023-12-04 DIAGNOSIS — K5732 Diverticulitis of large intestine without perforation or abscess without bleeding: Secondary | ICD-10-CM | POA: Diagnosis not present

## 2023-12-04 DIAGNOSIS — Z933 Colostomy status: Secondary | ICD-10-CM | POA: Diagnosis not present

## 2023-12-04 NOTE — Telephone Encounter (Signed)
 Spoke with patient advising forms are ready for pickup

## 2023-12-04 NOTE — Telephone Encounter (Signed)
 Completed attending physician statement form for patient.  Please make a copy and inform patient it has been completed.

## 2023-12-06 DIAGNOSIS — H25813 Combined forms of age-related cataract, bilateral: Secondary | ICD-10-CM | POA: Diagnosis not present

## 2023-12-06 DIAGNOSIS — H401132 Primary open-angle glaucoma, bilateral, moderate stage: Secondary | ICD-10-CM | POA: Diagnosis not present

## 2023-12-06 DIAGNOSIS — H15833 Staphyloma posticum, bilateral: Secondary | ICD-10-CM | POA: Diagnosis not present

## 2023-12-21 ENCOUNTER — Other Ambulatory Visit

## 2024-01-03 DIAGNOSIS — K5732 Diverticulitis of large intestine without perforation or abscess without bleeding: Secondary | ICD-10-CM | POA: Diagnosis not present

## 2024-01-08 DIAGNOSIS — S2239XA Fracture of one rib, unspecified side, initial encounter for closed fracture: Secondary | ICD-10-CM

## 2024-01-08 HISTORY — DX: Fracture of one rib, unspecified side, initial encounter for closed fracture: S22.39XA

## 2024-01-09 ENCOUNTER — Ambulatory Visit: Payer: Self-pay

## 2024-01-09 NOTE — Telephone Encounter (Signed)
 FYI Only or Action Required?: FYI only for provider: appointment scheduled on 01/10/24.  Patient was last seen in primary care on 12/01/2023 by Catherine Fuller A, DO.  Called Nurse Triage reporting Hair/Scalp Problem.  Symptoms began several months ago. Persisting and worsening  Interventions attempted: Nothing.  Symptoms are: gradually worsening.  Triage Disposition: See PCP Within 2 Weeks  Patient/caregiver understands and will follow disposition?: Yes    Copied from CRM #[id number]. Topic: Clinical - Medical Advice >> Jan 09, 2024  9:30 AM Robinson H wrote: Reason for CRM: Hair is falling out in chunks since middle of summer, not sure if anything can be done. No other symptoms, was recently sick with GI issues. Forgot to talk to provider about it at last visit.  Lena [phone] Reason for Disposition  [1] Patch of hair loss AND [2] cause not known  Answer Assessment - Initial Assessment Questions 1. LOCATION: Where is the hair loss? (e.g., all of scalp, parts of scalp, back of head or neck)     All over thinning and breaking  2. DESCRIPTION: What does it look like? (e.g., thinning of hair, balding, patches of hair missing)     Chunks of hair falling out 3. ONSET: When did the hair loss begin? (e.g., sudden or gradual onset; days, weeks, months or years ago)      Several months 4. OTHER SYMPTOMS: What does the scalp look like where the hair is missing? (e.g., normal, redness, crusts, scarring)     denies 5. OTHER FACTORS: Have you had any of the following recently: childbirth, severe illness or injury, major surgery, major weight loss, cancer chemo, tight hair braids, serious stress?     Bowel resection around same time 6. CAUSE: What do you think is causing the hair loss?     unsure  Protocols used: Hair Loss-A-AH

## 2024-01-10 ENCOUNTER — Ambulatory Visit: Admitting: Family Medicine

## 2024-01-10 ENCOUNTER — Encounter: Payer: Self-pay | Admitting: Family Medicine

## 2024-01-10 VITALS — BP 118/76 | HR 64 | Temp 98.2°F | Wt 129.0 lb

## 2024-01-10 DIAGNOSIS — L659 Nonscarring hair loss, unspecified: Secondary | ICD-10-CM

## 2024-01-10 DIAGNOSIS — Z1231 Encounter for screening mammogram for malignant neoplasm of breast: Secondary | ICD-10-CM | POA: Diagnosis not present

## 2024-01-10 DIAGNOSIS — L650 Telogen effluvium: Secondary | ICD-10-CM

## 2024-01-10 LAB — IBC + FERRITIN
Ferritin: 49.1 ng/mL (ref 10.0–291.0)
Iron: 53 ug/dL (ref 42–145)
Saturation Ratios: 16.5 % — ABNORMAL LOW (ref 20.0–50.0)
TIBC: 320.6 ug/dL (ref 250.0–450.0)
Transferrin: 229 mg/dL (ref 212.0–360.0)

## 2024-01-10 LAB — T4, FREE: Free T4: 0.95 ng/dL (ref 0.60–1.60)

## 2024-01-10 LAB — VITAMIN D 25 HYDROXY (VIT D DEFICIENCY, FRACTURES): VITD: 32.04 ng/mL (ref 30.00–100.00)

## 2024-01-10 LAB — TSH: TSH: 0.8 u[IU]/mL (ref 0.35–5.50)

## 2024-01-10 NOTE — Patient Instructions (Signed)
 Once lab results received, we will discuss plan and follow up        Great to see you today.  I have refilled the medication(s) we provide.   If labs were collected or images ordered, we will inform you of  results once we have received them and reviewed. We will contact you either by echart message, or telephone call.  Please give ample time to the testing facility, and our office to run,  receive and review results. Please do not call inquiring of results, even if you can see them in your chart. We will contact you as soon as we are able. If it has been over 1 week since the test was completed, and you have not yet heard from us , then please call us .    - echart message- for normal results that have been seen by the patient already.   - telephone call: abnormal results or if patient has not viewed results in their echart.  If a referral to a specialist was entered for you, please call us  in 2 weeks if you have not heard from the specialist office to schedule.

## 2024-01-10 NOTE — Progress Notes (Signed)
 Angela [name] , 1950-04-07, 73 y.o., female MRN: [id number] Patient Care Team    Relationship Specialty Notifications Start End  Angela [name] LABOR, DO PCP - General Family Medicine  04/10/17   Angela Arlyss CROME, MD Consulting Physician Urology  09/06/23   Angela Cordella LABOR, MD Consulting Physician General Surgery  09/06/23   Angela Glendia BRAVO, MD Consulting Physician Gastroenterology  12/04/23     Chief Complaint  Patient presents with   Loss of hair    Since being in the hospital, pt has been experiencing severe hair loss.      Subjective: Angela [name] is a 73 y.o. Pt presents for an OV with complaints of hair loss.  Patient was hospitalized over the summer for some time, requiring partial colectomy.  She reports since hospitalization she has noticed rather significant hair loss.  She states there is thinning throughout her scalp. She is taking an iron supplement.  She is taking vitamin D .     06/23/2023    1:12 PM 04/19/2023    8:57 AM 02/22/2023    7:48 AM 04/13/2022    8:37 AM 03/24/2021    1:49 PM  Depression screen PHQ 2/9  Decreased Interest 0 0 0 0 0  Down, Depressed, Hopeless 0 0 0 0 0  PHQ - 2 Score 0 0 0 0 0  Altered sleeping 0 0     Tired, decreased energy 0 0     Change in appetite 0 0     Feeling bad or failure about yourself  0 0     Trouble concentrating 0 0     Moving slowly or fidgety/restless 0 0     Suicidal thoughts 0 0     PHQ-9 Score 0  0      Difficult doing work/chores Not difficult at all Not difficult at all        Data saved with a previous flowsheet row definition    Allergies  Allergen Reactions   Codeine Other (See Comments)    Irritable , insomnia    Social History   Social History Narrative   Married. Retired. 2 children.   Bachelors degree.   Exercises routinely.   Drinks caffeine.   Smoke alarm in the home. Wears her seatbelt. Owns firearms.   Feels safe in her relationships.   Past Medical History:  Diagnosis Date    Arthritis    Blood in stool    greater than 4 years since last incidence of blood in stool    Colon polyps    Glaucoma    Hydroureteronephrosis 07/2023   Hyperlipidemia    Hypertension    Inclusion cyst 2021   face   Perforation of sigmoid colon (HCC) 07/2023   Colitis   PNA (pneumonia) 2006   Sepsis (HCC) 07/2023   Strain of left wrist 04/16/2019   Past Surgical History:  Procedure Laterality Date   ABSCESS DRAINAGE  2021   face- inclusion cyst   BREAST BIOPSY Left 07/19/2018   US  guided breast bx with clip placement- benign   COLON RESECTION SIGMOID  07/30/2023   Procedure: COLECTOMY, SIGMOID, OPEN;  Surgeon: Angela Cordella LABOR, MD;  Location: [facility];  Service: General;;   CYSTOSCOPY W/ URETERAL STENT PLACEMENT Left 08/18/2023   Procedure: CYSTOSCOPY, WITH RETROGRADE PYELOGRAM AND URETERAL STENT INSERTION;  Surgeon: Angela Arlyss CROME, MD;  Location: [facility];  Service: Urology;  Laterality: Left;   CYSTOSCOPY/URETEROSCOPY/HOLMIUM LASER/STENT PLACEMENT Left 09/26/2023  Procedure: CYSTOSCOPY/URETEROSCOPY/HOLMIUM LASER/STENT PLACEMENT;  Surgeon: Angela Arlyss CROME, MD;  Location: [facility];  Service: Urology;  Laterality: Left;   EYE SURGERY  1956   HEMORRHOID SURGERY  2001   IR CATHETER TUBE CHANGE  08/30/2023   IR CV LINE INJECTION  08/24/2023   IR RADIOLOGIST EVAL & MGMT  09/15/2023   IR RADIOLOGIST EVAL & MGMT  09/20/2023   IR RADIOLOGIST EVAL & MGMT  10/18/2023   IR RADIOLOGIST EVAL & MGMT  10/04/2023   LAPAROTOMY N/A 07/30/2023   Procedure: LAPAROTOMY, EXPLORATORY;  Surgeon: Angela Cordella LABOR, MD;  Location: [facility];  Service: General;  Laterality: N/A;   RADIOACTIVE SEED GUIDED EXCISIONAL BREAST BIOPSY Left 03/15/2019   Procedure: RADIOACTIVE SEED GUIDED EXCISIONAL LEFT  BREAST BIOPSY;  Surgeon: Gladis Cough, MD;  Location: [facility];  Service: General;  Laterality: Left;   TONSILLECTOMY  1963   WISDOM TOOTH EXTRACTION     Family History  Problem Relation Age of Onset    Heart attack Father    Heart attack Sister    Hyperlipidemia Sister    Hypertension Sister    Allergies as of 01/10/2024       Reactions   Codeine Other (See Comments)   Irritable , insomnia         Medication List        Accurate as of January 10, 2024  2:45 PM. If you have any questions, ask your nurse or doctor.          acetaminophen  500 MG tablet Commonly known as: TYLENOL  Take 2 tablets (1,000 mg total) by mouth every 6 (six) hours as needed.   albuterol  108 (90 Base) MCG/ACT inhaler Commonly known as: VENTOLIN  HFA Inhale 2 puffs into the lungs every 6 (six) hours as needed for wheezing or shortness of breath.   carvedilol  6.25 MG tablet Commonly known as: COREG  Take 1 tablet (6.25 mg total) by mouth 2 (two) times daily with a meal.   celecoxib 100 MG capsule Commonly known as: CELEBREX Take 100 mg by mouth 2 (two) times daily.   DAILY PROBIOTIC PO Take 1 capsule by mouth daily at 12 noon.   ferrous sulfate  325 (65 FE) MG tablet Take 1 tablet (325 mg total) by mouth every other day. Follow your iron and anemia with your PCP outpatient   Hyoscyamine  Sulfate SL 0.125 MG Subl Commonly known as: Levsin /SL Place 1 tablet (0.125 mg total) under the tongue every 6 (six) hours as needed (bladder spasms, stent discomfort).   ipratropium 0.03 % nasal spray Commonly known as: ATROVENT  Place 2 sprays into both nostrils every 12 (twelve) hours.   latanoprost  0.005 % ophthalmic solution Commonly known as: XALATAN  Place 1 drop into both eyes at bedtime.   lisinopril  10 MG tablet Commonly known as: ZESTRIL  Take 1 tablet (10 mg total) by mouth daily.   multivitamin tablet Take 1 tablet by mouth daily.   pantoprazole  40 MG tablet Commonly known as: PROTONIX  Take 1 tablet (40 mg total) by mouth daily.   polyethylene glycol powder 17 GM/SCOOP powder Commonly known as: GLYCOLAX /MIRALAX  Take 17 g by mouth daily.   timolol  0.5 % ophthalmic solution Commonly  known as: TIMOPTIC  Place 1 drop into both eyes daily.        All past medical history, surgical history, allergies, family history, immunizations andmedications were updated in the EMR today and reviewed under the history and medication portions of their EMR.     ROS Negative, with  the exception of above mentioned in HPI   Objective:  BP 118/76   Pulse 64   Temp 98.2 F (36.8 C)   Wt 129 lb (58.5 kg)   SpO2 96%   BMI 22.14 kg/m  Body mass index is 22.14 kg/m. Physical Exam Vitals and nursing note reviewed.  Constitutional:      General: She is not in acute distress.    Appearance: Normal appearance. She is normal weight. She is not ill-appearing or toxic-appearing.  HENT:     Head: Normocephalic and atraumatic.     Comments: thinning hair diffusely distributed over scalp.  No erythema, rash or dryness. Eyes:     General: No scleral icterus.       Right eye: No discharge.        Left eye: No discharge.     Extraocular Movements: Extraocular movements intact.     Conjunctiva/sclera: Conjunctivae normal.     Pupils: Pupils are equal, round, and reactive to light.  Skin:    Findings: No rash.  Neurological:     Mental Status: She is alert and oriented to person, place, and time. Mental status is at baseline.     Motor: No weakness.     Coordination: Coordination normal.     Gait: Gait normal.  Psychiatric:        Mood and Affect: Mood normal.        Behavior: Behavior normal.        Thought Content: Thought content normal.        Judgment: Judgment normal.     No results found. No results found. No results found for this or any previous visit (from the past 24 hours).  Assessment/Plan: RUTH KOVICH is a 73 y.o. female present for OV for  Hair loss (Primary) We discussed telogen effluvium vs adrogenic alopecia and other causes of hair loss today. She had been through a serious illness, hospitalization and surgery a few months ago.  Start with labs to rule  out endocrine or deficiency  Causes.  - Vitamin D (25 hydroxy) - IBC + Ferritin - TSH - T4, free We discussed finasteride today, and pt would like to start med if her labs do not indicate other correctable cause of hair loss.  - once results received, pt will be called and plan/follow up discussed at that time. Breast cancer screening by mammogram - MM 3D SCREENING MAMMOGRAM BILATERAL BREAST; Future  Reviewed expectations re: course of current medical issues. Discussed self-management of symptoms. Outlined signs and symptoms indicating need for more acute intervention. Patient verbalized understanding and all questions were answered. Patient received an After-Visit Summary.    Orders Placed This Encounter  Procedures   MM 3D SCREENING MAMMOGRAM BILATERAL BREAST   Vitamin D (25 hydroxy)   IBC + Ferritin   TSH   T4, free   No orders of the defined types were placed in this encounter.  Referral Orders  No referral(s) requested today     Note is dictated utilizing voice recognition software. Although note has been proof read prior to signing, occasional typographical errors still can be missed. If any questions arise, please do not hesitate to call for verification.   electronically signed by:  [name] Bellini, DO  [facility] - [place]

## 2024-01-11 ENCOUNTER — Ambulatory Visit: Payer: Self-pay | Admitting: General Surgery

## 2024-01-11 ENCOUNTER — Ambulatory Visit: Payer: Self-pay | Admitting: Family Medicine

## 2024-01-11 DIAGNOSIS — Z933 Colostomy status: Secondary | ICD-10-CM

## 2024-01-11 DIAGNOSIS — Z4889 Encounter for other specified surgical aftercare: Secondary | ICD-10-CM | POA: Diagnosis not present

## 2024-01-11 MED ORDER — FINASTERIDE 5 MG PO TABS: 5.0000 mg | ORAL_TABLET | Freq: Every day | ORAL | 1 refills | Status: AC

## 2024-01-12 ENCOUNTER — Encounter: Payer: Self-pay | Admitting: Gastroenterology

## 2024-01-12 ENCOUNTER — Ambulatory Visit: Admitting: Gastroenterology

## 2024-01-12 VITALS — BP 116/70 | HR 78 | Ht 64.0 in | Wt 128.0 lb

## 2024-01-12 DIAGNOSIS — Z933 Colostomy status: Secondary | ICD-10-CM

## 2024-01-12 DIAGNOSIS — Z860100 Personal history of colon polyps, unspecified: Secondary | ICD-10-CM | POA: Diagnosis not present

## 2024-01-12 MED ORDER — NA SULFATE-K SULFATE-MG SULF 17.5-3.13-1.6 GM/177ML PO SOLN: 1.0000 | Freq: Once | ORAL | 0 refills | Status: AC

## 2024-01-12 NOTE — Progress Notes (Unsigned)
 HPI :    Mother with CRC 52   Colonoscopy June 2021 (Dr. Burnette) Positive FIT Difficult colonoscopy due to diverticulosis, restricted mobility of the colon, redundant, tortuous colon with significant looping requiring manual pressure and repositioning of the patient; 32 minutes cecal intubation time 5 mm tubular adenoma in ascending colon  Repeat colonoscopy vs. Virtual colonoscopy in 5 years  Path:  33 cm of sigmoid colon   Dr. Raynald note Angela [name] is a 73 y.o. female who underwent an ex-lap and Hartmann's procedure for stercoral colitis with perforation in June of 2025. She had evidence of a fistula from her rectal stump but her IR drain was removed and this appears to have resolved. She currently denies complaints and appears to be doing well. I will tentatively plan for open reversal in February, which should give her time to be seen by GI and have a colonoscopy performed.   Patient had perforated sigmoid colon secondary to stercoral colitis vs diverticulitis in June 2025 with prolonged hospitalization and recovery. S/p Hartmans. Had colonoscopy in 2021 by Dr. Burnette which was technically difficult but negative for polyps. Recommended consideration of virtual colonoscopy for subsequent screening   Past Medical History:  Diagnosis Date   Arthritis    Blood in stool    greater than 4 years since last incidence of blood in stool    Colon polyps    Glaucoma    Hydroureteronephrosis 07/2023   Hyperlipidemia    Hypertension    Inclusion cyst 2021   face   Perforation of sigmoid colon (HCC) 07/2023   Colitis   PNA (pneumonia) 2006   Sepsis (HCC) 07/2023   Strain of left wrist 04/16/2019     Past Surgical History:  Procedure Laterality Date   ABSCESS DRAINAGE  2021   face- inclusion cyst   BREAST BIOPSY Left 07/19/2018   US  guided breast bx with clip placement- benign   COLON RESECTION SIGMOID  07/30/2023   Procedure: COLECTOMY, SIGMOID, OPEN;  Surgeon:  Polly Cordella LABOR, MD;  Location: [facility];  Service: General;;   CYSTOSCOPY W/ URETERAL STENT PLACEMENT Left 08/18/2023   Procedure: CYSTOSCOPY, WITH RETROGRADE PYELOGRAM AND URETERAL STENT INSERTION;  Surgeon: Lovie Arlyss CROME, MD;  Location: [facility];  Service: Urology;  Laterality: Left;   CYSTOSCOPY/URETEROSCOPY/HOLMIUM LASER/STENT PLACEMENT Left 09/26/2023   Procedure: CYSTOSCOPY/URETEROSCOPY/HOLMIUM LASER/STENT PLACEMENT;  Surgeon: Lovie Arlyss CROME, MD;  Location: [facility];  Service: Urology;  Laterality: Left;   EYE SURGERY  1956   HEMORRHOID SURGERY  2001   IR CATHETER TUBE CHANGE  08/30/2023   IR CV LINE INJECTION  08/24/2023   IR RADIOLOGIST EVAL & MGMT  09/15/2023   IR RADIOLOGIST EVAL & MGMT  09/20/2023   IR RADIOLOGIST EVAL & MGMT  10/18/2023   IR RADIOLOGIST EVAL & MGMT  10/04/2023   LAPAROTOMY N/A 07/30/2023   Procedure: LAPAROTOMY, EXPLORATORY;  Surgeon: Polly Cordella LABOR, MD;  Location: [facility];  Service: General;  Laterality: N/A;   RADIOACTIVE SEED GUIDED EXCISIONAL BREAST BIOPSY Left 03/15/2019   Procedure: RADIOACTIVE SEED GUIDED EXCISIONAL LEFT  BREAST BIOPSY;  Surgeon: Gladis Cough, MD;  Location:  [facility]ERY [facility];  Service: General;  Laterality: Left;   TONSILLECTOMY  1963   WISDOM TOOTH EXTRACTION     Family History  Problem Relation Age of Onset   Heart attack Father    Heart attack Sister    Hyperlipidemia Sister    Hypertension Sister    Social History  Tobacco Use   Smoking status: Former    Current packs/day: 0.00    Types: Cigars, Cigarettes    Passive exposure: Past   Smokeless tobacco: Never   Tobacco comments:    *occasional smoker   Vaping Use   Vaping status: Former  Substance Use Topics   Alcohol use: Not Currently    Alcohol/week: 4.0 standard drinks of alcohol    Types: 4 Glasses of wine per week    Comment: *rarely    Drug use: No   Current Outpatient Medications  Medication Sig Dispense Refill   acetaminophen  (TYLENOL ) 500 MG tablet  Take 2 tablets (1,000 mg total) by mouth every 6 (six) hours as needed.     albuterol  (VENTOLIN  HFA) 108 (90 Base) MCG/ACT inhaler Inhale 2 puffs into the lungs every 6 (six) hours as needed for wheezing or shortness of breath. 8 g 0   carvedilol  (COREG ) 6.25 MG tablet Take 1 tablet (6.25 mg total) by mouth 2 (two) times daily with a meal. 60 tablet 5   celecoxib (CELEBREX) 100 MG capsule Take 100 mg by mouth 2 (two) times daily.     ferrous sulfate  325 (65 FE) MG tablet Take 1 tablet (325 mg total) by mouth every other day. Follow your iron and anemia with your PCP outpatient 15 tablet 0   finasteride  (PROSCAR ) 5 MG tablet Take 1 tablet (5 mg total) by mouth daily. 90 tablet 1   Hyoscyamine  Sulfate SL (LEVSIN /SL) 0.125 MG SUBL Place 1 tablet (0.125 mg total) under the tongue every 6 (six) hours as needed (bladder spasms, stent discomfort). 20 tablet 1   ipratropium (ATROVENT ) 0.03 % nasal spray Place 2 sprays into both nostrils every 12 (twelve) hours. 30 mL 0   latanoprost  (XALATAN ) 0.005 % ophthalmic solution Place 1 drop into both eyes at bedtime.  4   lisinopril  (ZESTRIL ) 10 MG tablet Take 1 tablet (10 mg total) by mouth daily. 90 tablet 1   Multiple Vitamin (MULTIVITAMIN) tablet Take 1 tablet by mouth daily.     pantoprazole  (PROTONIX ) 40 MG tablet Take 1 tablet (40 mg total) by mouth daily. 90 tablet 1   polyethylene glycol powder (GLYCOLAX /MIRALAX ) 17 GM/SCOOP powder Take 17 g by mouth daily. 238 g 0   Probiotic Product (DAILY PROBIOTIC PO) Take 1 capsule by mouth daily at 12 noon.     timolol  (TIMOPTIC ) 0.5 % ophthalmic solution Place 1 drop into both eyes daily.     No current facility-administered medications for this visit.   Allergies  Allergen Reactions   Codeine Other (See Comments)    Irritable , insomnia      Review of Systems: All systems reviewed and negative except where noted in HPI.    No results found.  Physical Exam: BP 116/70   Pulse 78   Ht 5' 4 (1.626 m)    Wt 128 lb (58.1 kg)   BMI 21.97 kg/m  Constitutional: Pleasant,well-developed, ***female in no acute distress. HEENT: Normocephalic and atraumatic. Conjunctivae are normal. No scleral icterus. Neck supple.  Cardiovascular: Normal rate, regular rhythm.  Pulmonary/chest: Effort normal and breath sounds normal. No wheezing, rales or rhonchi. Abdominal: Soft, nondistended, nontender. Bowel sounds active throughout. There are no masses palpable. No hepatomegaly. Extremities: no edema Lymphadenopathy: No cervical adenopathy noted. Neurological: Alert and oriented to person place and time. Skin: Skin is warm and dry. No rashes noted. Psychiatric: Normal mood and affect. Behavior is normal.  CBC    Component Value Date/Time  WBC 6.9 12/01/2023 1131   RBC 4.74 12/01/2023 1131   HGB 14.2 12/01/2023 1131   HCT 42.9 12/01/2023 1131   PLT 353.0 12/01/2023 1131   MCV 90.5 12/01/2023 1131   MCH 29.3 09/04/2023 0340   MCHC 33.0 12/01/2023 1131   RDW 15.1 12/01/2023 1131   LYMPHSABS 2.3 12/01/2023 1131   MONOABS 0.5 12/01/2023 1131   EOSABS 0.3 12/01/2023 1131   BASOSABS 0.0 12/01/2023 1131    CMP     Component Value Date/Time   NA 142 12/01/2023 1131   NA 140 11/10/2007 0000   K 4.5 12/01/2023 1131   CL 103 12/01/2023 1131   CO2 30 12/01/2023 1131   GLUCOSE 87 12/01/2023 1131   BUN 20 12/01/2023 1131   BUN 10 11/10/2007 0000   CREATININE 0.61 12/01/2023 1131   CREATININE 0.74 06/26/2020 1522   CALCIUM  9.4 12/01/2023 1131   PROT 6.6 12/01/2023 1131   ALBUMIN  4.2 12/01/2023 1131   AST 13 12/01/2023 1131   ALT 12 12/01/2023 1131   ALKPHOS 85 12/01/2023 1131   BILITOT 0.3 12/01/2023 1131   GFRNONAA >60 09/04/2023 0340       Latest Ref Rng & Units 12/01/2023   11:31 AM 09/06/2023   11:39 AM 09/04/2023    3:40 AM  CBC EXTENDED  WBC 4.0 - 10.5 K/uL 6.9  6.3  5.9   RBC 3.87 - 5.11 Mil/uL 4.74  3.80  3.34   Hemoglobin 12.0 - 15.0 g/dL 85.7  88.8  9.8   HCT 63.9 - 46.0 % 42.9   34.3  31.3   Platelets 150.0 - 400.0 K/uL 353.0  381.0  312   NEUT# 1.4 - 7.7 K/uL 3.7  3.8    Lymph# 0.7 - 4.0 K/uL 2.3  1.6        ASSESSMENT AND PLAN:  Kuneff, Renee A, DO

## 2024-01-12 NOTE — Patient Instructions (Signed)
 You have been scheduled for a colonoscopy. Please follow written instructions given to you at your visit today.   If you use inhalers (even only as needed), please bring them with you on the day of your procedure.  DO NOT TAKE 7 DAYS PRIOR TO TEST- Trulicity (dulaglutide) Ozempic, Wegovy (semaglutide) Mounjaro, Zepbound (tirzepatide) Bydureon Bcise (exanatide extended release)  DO NOT TAKE 1 DAY PRIOR TO YOUR TEST Rybelsus (semaglutide) Adlyxin (lixisenatide) Victoza (liraglutide) Byetta (exanatide) ______________________________________________________________  _______________________________________________________  If your blood pressure at your visit was 140/90 or greater, please contact your primary care physician to follow up on this.  _______________________________________________________  If you are age 36 or older, your body mass index should be between 23-30. Your Body mass index is 21.97 kg/m. If this is out of the aforementioned range listed, please consider follow up with your Primary Care Provider.  If you are age 44 or younger, your body mass index should be between 19-25. Your Body mass index is 21.97 kg/m. If this is out of the aformentioned range listed, please consider follow up with your Primary Care Provider.   ________________________________________________________  The [facility] providers would like to encourage you to use MYCHART to communicate with providers for non-urgent requests or questions.  Due to long hold times on the telephone, sending your provider a message by [facility] may be a faster and more efficient way to get a response.  Please allow 48 business hours for a response.  Please remember that this is for non-urgent requests.  _______________________________________________________  [facility] is using a team-based approach to care.  Your team is made up of your doctor and two to three APPS. Our APPS (Nurse Practitioners and  Physician Assistants) work with your physician to ensure care continuity for you. They are fully qualified to address your health concerns and develop a treatment plan. They communicate directly with your gastroenterologist to care for you. Seeing the Advanced Practice Practitioners on your physician's team can help you by facilitating care more promptly, often allowing for earlier appointments, access to diagnostic testing, procedures, and other specialty referrals.

## 2024-01-18 DIAGNOSIS — K5732 Diverticulitis of large intestine without perforation or abscess without bleeding: Secondary | ICD-10-CM | POA: Diagnosis not present

## 2024-01-18 DIAGNOSIS — Z933 Colostomy status: Secondary | ICD-10-CM | POA: Diagnosis not present

## 2024-01-24 ENCOUNTER — Inpatient Hospital Stay: Admission: RE | Admit: 2024-01-24 | Discharge: 2024-01-24 | Attending: Family Medicine | Admitting: Family Medicine

## 2024-01-24 DIAGNOSIS — Z1231 Encounter for screening mammogram for malignant neoplasm of breast: Secondary | ICD-10-CM

## 2024-02-09 ENCOUNTER — Encounter: Admitting: Gastroenterology

## 2024-02-13 ENCOUNTER — Encounter: Payer: Self-pay | Admitting: Family Medicine

## 2024-02-13 ENCOUNTER — Ambulatory Visit (INDEPENDENT_AMBULATORY_CARE_PROVIDER_SITE_OTHER): Admitting: Family Medicine

## 2024-02-13 VITALS — BP 120/72 | HR 70 | Temp 98.2°F | Wt 136.0 lb

## 2024-02-13 DIAGNOSIS — R0789 Other chest pain: Secondary | ICD-10-CM | POA: Insufficient documentation

## 2024-02-13 DIAGNOSIS — R10A1 Flank pain, right side: Secondary | ICD-10-CM

## 2024-02-13 DIAGNOSIS — J209 Acute bronchitis, unspecified: Secondary | ICD-10-CM | POA: Diagnosis not present

## 2024-02-13 LAB — POC URINALSYSI DIPSTICK (AUTOMATED)
Bilirubin, UA: NEGATIVE
Glucose, UA: NEGATIVE
Ketones, UA: NEGATIVE
Leukocytes, UA: NEGATIVE
Nitrite, UA: NEGATIVE
Protein, UA: NEGATIVE
Spec Grav, UA: 1.01
Urobilinogen, UA: NEGATIVE U/dL — AB
pH, UA: 7

## 2024-02-13 MED ORDER — AZITHROMYCIN 250 MG PO TABS: ORAL_TABLET | ORAL | 0 refills | Status: AC

## 2024-02-13 NOTE — Patient Instructions (Signed)

## 2024-02-13 NOTE — Progress Notes (Signed)
 "      Angela [name] , Jun 11, 1950, 74 y.o., female MRN: [id number] Patient Care Team    Relationship Specialty Notifications Start End  Catherine Charlies LABOR, DO PCP - General Family Medicine  04/10/17   Lovie Arlyss CROME, MD Consulting Physician Urology  09/06/23   Polly Cordella LABOR, MD Consulting Physician General Surgery  09/06/23   Stacia Glendia BRAVO, MD Consulting Physician Gastroenterology  12/04/23     Chief Complaint  Patient presents with   Cough    Over a week; cough, congestion, drainage. Also mentions R sided flank pain. Pt has tried Mucinex.      Subjective: Angela [name] is a 74 y.o. Pt presents for an OV with complaints of cough with some sputum production of 1 2 weeks duration.  Associated symptoms include spasmodic cough, nasal congestion, nasal drainage and right posterior rib pain.  Patient is a former smoker. Recent extended hospitalization for perforated sigmoid colon, diverticulitis open followed by severe UTI requiring infectious disease consult. Pt has tried Mucinex to ease their symptoms.      06/23/2023    1:12 PM 04/19/2023    8:57 AM 02/22/2023    7:48 AM 04/13/2022    8:37 AM 03/24/2021    1:49 PM  Depression screen PHQ 2/9  Decreased Interest 0 0 0 0 0  Down, Depressed, Hopeless 0 0 0 0 0  PHQ - 2 Score 0 0 0 0 0  Altered sleeping 0 0     Tired, decreased energy 0 0     Change in appetite 0 0     Feeling bad or failure about yourself  0 0     Trouble concentrating 0 0     Moving slowly or fidgety/restless 0 0     Suicidal thoughts 0 0     PHQ-9 Score 0  0      Difficult doing work/chores Not difficult at all Not difficult at all        Data saved with a previous flowsheet row definition    Allergies[1] Social History   Social History Narrative   Married. Retired. 2 children.   Bachelors degree.   Exercises routinely.   Drinks caffeine.   Smoke alarm in the home. Wears her seatbelt. Owns firearms.   Feels safe in her relationships.   Past  Medical History:  Diagnosis Date   Arthritis    Blood in stool    greater than 4 years since last incidence of blood in stool    Colon polyps    Glaucoma    Hydroureteronephrosis 07/2023   Hyperlipidemia    Hypertension    Inclusion cyst 2021   face   Perforation of sigmoid colon (HCC) 07/2023   Colitis   PNA (pneumonia) 2006   Sepsis (HCC) 07/2023   Strain of left wrist 04/16/2019   Past Surgical History:  Procedure Laterality Date   ABSCESS DRAINAGE  2021   face- inclusion cyst   BREAST BIOPSY Left 07/19/2018   US  guided breast bx with clip placement- benign   COLON RESECTION SIGMOID  07/30/2023   Procedure: COLECTOMY, SIGMOID, OPEN;  Surgeon: Polly Cordella LABOR, MD;  Location: [facility];  Service: General;;   CYSTOSCOPY W/ URETERAL STENT PLACEMENT Left 08/18/2023   Procedure: CYSTOSCOPY, WITH RETROGRADE PYELOGRAM AND URETERAL STENT INSERTION;  Surgeon: Lovie Arlyss CROME, MD;  Location: [facility];  Service: Urology;  Laterality: Left;   CYSTOSCOPY/URETEROSCOPY/HOLMIUM LASER/STENT PLACEMENT Left 09/26/2023   Procedure: CYSTOSCOPY/URETEROSCOPY/HOLMIUM LASER/STENT PLACEMENT;  Surgeon: Lovie Arlyss CROME, MD;  Location: [facility];  Service: Urology;  Laterality: Left;   EYE SURGERY  1956   HEMORRHOID SURGERY  2001   IR CATHETER TUBE CHANGE  08/30/2023   IR CV LINE INJECTION  08/24/2023   IR RADIOLOGIST EVAL & MGMT  09/15/2023   IR RADIOLOGIST EVAL & MGMT  09/20/2023   IR RADIOLOGIST EVAL & MGMT  10/18/2023   IR RADIOLOGIST EVAL & MGMT  10/04/2023   LAPAROTOMY N/A 07/30/2023   Procedure: LAPAROTOMY, EXPLORATORY;  Surgeon: Polly Cordella LABOR, MD;  Location: [facility];  Service: General;  Laterality: N/A;   RADIOACTIVE SEED GUIDED EXCISIONAL BREAST BIOPSY Left 03/15/2019   Procedure: RADIOACTIVE SEED GUIDED EXCISIONAL LEFT  BREAST BIOPSY;  Surgeon: Gladis Cough, MD;  Location: [facility];  Service: General;  Laterality: Left;   TONSILLECTOMY  1963   WISDOM TOOTH EXTRACTION     Family  History  Problem Relation Age of Onset   Heart attack Father    Heart attack Sister    Hyperlipidemia Sister    Hypertension Sister    Breast cancer Neg Hx    Allergies as of 02/13/2024       Reactions   Codeine Other (See Comments)   Irritable , insomnia         Medication List        Accurate as of February 13, 2024  3:15 PM. If you have any questions, ask your nurse or doctor.          acetaminophen  500 MG tablet Commonly known as: TYLENOL  Take 2 tablets (1,000 mg total) by mouth every 6 (six) hours as needed.   albuterol  108 (90 Base) MCG/ACT inhaler Commonly known as: VENTOLIN  HFA Inhale 2 puffs into the lungs every 6 (six) hours as needed for wheezing or shortness of breath.   azithromycin  250 MG tablet Commonly known as: ZITHROMAX  Take 2 tablets on day 1, then 1 tablet daily on days 2 through 5 Started by: Charlies Bellini, DO   carvedilol  6.25 MG tablet Commonly known as: COREG  Take 1 tablet (6.25 mg total) by mouth 2 (two) times daily with a meal.   celecoxib 100 MG capsule Commonly known as: CELEBREX Take 100 mg by mouth 2 (two) times daily.   DAILY PROBIOTIC PO Take 1 capsule by mouth daily at 12 noon.   ferrous sulfate  325 (65 FE) MG tablet Take 1 tablet (325 mg total) by mouth every other day. Follow your iron and anemia with your PCP outpatient   finasteride  5 MG tablet Commonly known as: Proscar  Take 1 tablet (5 mg total) by mouth daily.   Hyoscyamine  Sulfate SL 0.125 MG Subl Commonly known as: Levsin /SL Place 1 tablet (0.125 mg total) under the tongue every 6 (six) hours as needed (bladder spasms, stent discomfort).   ipratropium 0.03 % nasal spray Commonly known as: ATROVENT  Place 2 sprays into both nostrils every 12 (twelve) hours.   latanoprost  0.005 % ophthalmic solution Commonly known as: XALATAN  Place 1 drop into both eyes at bedtime.   lisinopril  10 MG tablet Commonly known as: ZESTRIL  Take 1 tablet (10 mg total) by mouth daily.    multivitamin tablet Take 1 tablet by mouth daily.   pantoprazole  40 MG tablet Commonly known as: PROTONIX  Take 1 tablet (40 mg total) by mouth daily.   polyethylene glycol powder 17 GM/SCOOP powder Commonly known as: GLYCOLAX /MIRALAX  Take 17 g by mouth daily.   timolol  0.5 % ophthalmic solution Commonly known  as: TIMOPTIC  Place 1 drop into both eyes daily.        All past medical history, surgical history, allergies, family history, immunizations andmedications were updated in the EMR today and reviewed under the history and medication portions of their EMR.     ROS Negative, with the exception of above mentioned in HPI   Objective:  BP 120/72   Pulse 70   Temp 98.2 F (36.8 C)   Wt 136 lb (61.7 kg)   SpO2 98%   BMI 23.34 kg/m  Body mass index is 23.34 kg/m. Physical Exam Vitals and nursing note reviewed.  Constitutional:      General: She is not in acute distress.    Appearance: Normal appearance. She is normal weight. She is not ill-appearing or toxic-appearing.  HENT:     Head: Normocephalic and atraumatic.     Right Ear: Tympanic membrane, ear canal and external ear normal.     Left Ear: Tympanic membrane, ear canal and external ear normal.     Nose: Congestion and rhinorrhea present.     Comments: PND present    Mouth/Throat:     Mouth: Mucous membranes are moist.     Pharynx: No oropharyngeal exudate or posterior oropharyngeal erythema.  Eyes:     General: No scleral icterus.       Right eye: No discharge.        Left eye: No discharge.     Extraocular Movements: Extraocular movements intact.     Conjunctiva/sclera: Conjunctivae normal.     Pupils: Pupils are equal, round, and reactive to light.  Cardiovascular:     Rate and Rhythm: Normal rate and regular rhythm.     Heart sounds: No murmur heard. Pulmonary:     Effort: Pulmonary effort is normal. No respiratory distress.     Breath sounds: No wheezing, rhonchi or rales.  Abdominal:      General: Bowel sounds are normal. There is no distension.     Palpations: Abdomen is soft.     Tenderness: There is no abdominal tenderness. There is no right CVA tenderness, left CVA tenderness, guarding or rebound.  Musculoskeletal:        General: Tenderness (Posterior rib 7 -9 tender to palpation) present.     Cervical back: Neck supple.  Lymphadenopathy:     Cervical: No cervical adenopathy.  Skin:    Findings: No rash.  Neurological:     Mental Status: She is alert and oriented to person, place, and time. Mental status is at baseline.     Motor: No weakness.     Coordination: Coordination normal.     Gait: Gait normal.  Psychiatric:        Mood and Affect: Mood normal.        Behavior: Behavior normal.        Thought Content: Thought content normal.        Judgment: Judgment normal.     No results found. No results found. Results for orders placed or performed in visit on 02/13/24 (from the past 24 hours)  POCT Urinalysis Dipstick (Automated)     Status: Abnormal   Collection Time: 02/13/24  2:52 PM  Result Value Ref Range   Color, UA yellow    Clarity, UA clear    Glucose, UA Negative Negative   Bilirubin, UA negative    Ketones, UA negative    Spec Grav, UA 1.010 1.010 - 1.025   Blood, UA none    pH, UA  7.0 5.0 - 8.0   Protein, UA Negative Negative   Urobilinogen, UA negative (A) 0.2 or 1.0 E.U./dL   Nitrite, UA negative    Leukocytes, UA Negative Negative    Assessment/Plan: Angela [name] is a 74 y.o. female present for OV for  Right flank pain - Urinalysis w microscopic + reflex cultur-pending - POCT Urinalysis Dipstick (Automated)-WNL -Will send urine cultures to be complete considering her recent history of significant UTI x 2 requiring infectious disease and hospitalization.  Suspect her flank pain is from either hairline fracture ribs from coughing or intercostal muscle strain from coughing.  Acute bronchitis with symptoms > 10 days (Primary)/Rib pain  on right side Lung exam is normal today, she does still have cough that is pretty persistent.  She is also tender over her posterior right ribs 7 through 9.  Possible hairline fracture versus strain of intercostal muscles from spasmodic coughing over the last 2 weeks. - DG Chest 2 View; Future-right rib series Z-Pak prescribed. Rest.  Hydrate. Once results received patient will be called and further plan discussed at that time   Reviewed expectations re: course of current medical issues. Discussed self-management of symptoms. Outlined signs and symptoms indicating need for more acute intervention. Patient verbalized understanding and all questions were answered. Patient received an After-Visit Summary.    Orders Placed This Encounter  Procedures   DG Chest 2 View   DG Ribs Unilateral Right   Urinalysis w microscopic + reflex cultur   POCT Urinalysis Dipstick (Automated)   Meds ordered this encounter  Medications   azithromycin  (ZITHROMAX ) 250 MG tablet    Sig: Take 2 tablets on day 1, then 1 tablet daily on days 2 through 5    Dispense:  6 tablet    Refill:  0   Referral Orders  No referral(s) requested today     Note is dictated utilizing voice recognition software. Although note has been proof read prior to signing, occasional typographical errors still can be missed. If any questions arise, please do not hesitate to call for verification.   electronically signed by:  Charlies Bellini, DO  [facility] - OR       [1]  Allergies Allergen Reactions   Codeine Other (See Comments)    Irritable , insomnia    "

## 2024-02-15 ENCOUNTER — Ambulatory Visit: Payer: Self-pay | Admitting: Family Medicine

## 2024-02-15 LAB — URINALYSIS W MICROSCOPIC + REFLEX CULTURE
Bacteria, UA: NONE SEEN /HPF
Bilirubin Urine: NEGATIVE
Glucose, UA: NEGATIVE
Hgb urine dipstick: NEGATIVE
Hyaline Cast: NONE SEEN /LPF
Ketones, ur: NEGATIVE
Nitrites, Initial: NEGATIVE
Protein, ur: NEGATIVE
Specific Gravity, Urine: 1.016 (ref 1.001–1.035)
pH: 7.5 (ref 5.0–8.0)

## 2024-02-15 LAB — URINE CULTURE
MICRO NUMBER:: 17435304
Result:: NO GROWTH
SPECIMEN QUALITY:: ADEQUATE

## 2024-02-15 LAB — CULTURE INDICATED

## 2024-02-16 ENCOUNTER — Encounter: Payer: Self-pay | Admitting: Gastroenterology

## 2024-02-19 ENCOUNTER — Telehealth: Payer: Self-pay | Admitting: Gastroenterology

## 2024-02-19 NOTE — Telephone Encounter (Signed)
 PT is calling to discuss prep for procedure. She is confused as to why she needs to take an enema

## 2024-02-20 NOTE — Telephone Encounter (Signed)
 Spoke with patient and explained that the enema was to make sure her rectum was cleaned out in conjunction with the different procedures she had (ostomy, Hartman's);  Patient agreed

## 2024-02-20 NOTE — Telephone Encounter (Signed)
 Patient calling in regards to previous note Requesting a call back  Please advise  Thank you

## 2024-02-21 ENCOUNTER — Ambulatory Visit
Admission: RE | Admit: 2024-02-21 | Discharge: 2024-02-21 | Disposition: A | Discharge status: Home / Self Care | Source: Ambulatory Visit | Attending: Family Medicine | Admitting: Family Medicine

## 2024-02-21 DIAGNOSIS — J209 Acute bronchitis, unspecified: Secondary | ICD-10-CM

## 2024-02-21 DIAGNOSIS — R0789 Other chest pain: Secondary | ICD-10-CM

## 2024-02-23 ENCOUNTER — Encounter: Payer: Self-pay | Admitting: Gastroenterology

## 2024-02-23 ENCOUNTER — Ambulatory Visit: Admitting: Gastroenterology

## 2024-02-23 VITALS — BP 133/70 | HR 58 | Temp 97.4°F | Resp 26 | Ht 64.0 in | Wt 136.0 lb

## 2024-02-23 DIAGNOSIS — D123 Benign neoplasm of transverse colon: Secondary | ICD-10-CM | POA: Diagnosis not present

## 2024-02-23 DIAGNOSIS — Z933 Colostomy status: Secondary | ICD-10-CM

## 2024-02-23 DIAGNOSIS — K6289 Other specified diseases of anus and rectum: Secondary | ICD-10-CM | POA: Diagnosis not present

## 2024-02-23 DIAGNOSIS — K5730 Diverticulosis of large intestine without perforation or abscess without bleeding: Secondary | ICD-10-CM

## 2024-02-23 MED ORDER — SODIUM CHLORIDE 0.9 % IV SOLN: 500.0000 mL | INTRAVENOUS | Status: AC

## 2024-02-23 NOTE — Progress Notes (Signed)
 Pt's states no medical or surgical changes since previsit or office visit.

## 2024-02-23 NOTE — Patient Instructions (Signed)
 YOU HAD AN ENDOSCOPIC PROCEDURE TODAY AT THE [facility]:   Refer to the procedure report that was given to you for any specific questions about what was found during the examination.  If the procedure report does not answer your questions, please call your gastroenterologist to clarify.  If you requested that your care partner not be given the details of your procedure findings, then the procedure report has been included in a sealed envelope for you to review at your convenience later.  YOU SHOULD EXPECT: Some feelings of bloating in the abdomen. Passage of more gas than usual.  Walking can help get rid of the air that was put into your GI tract during the procedure and reduce the bloating. If you had a lower endoscopy (such as a colonoscopy or flexible sigmoidoscopy) you may notice spotting of blood in your stool or on the toilet paper. If you underwent a bowel prep for your procedure, you may not have a normal bowel movement for a few days.  Please Note:  You might notice some irritation and congestion in your nose or some drainage.  This is from the oxygen used during your procedure.  There is no need for concern and it should clear up in a day or so.  SYMPTOMS TO REPORT IMMEDIATELY:  Following lower endoscopy (colonoscopy or flexible sigmoidoscopy):  Excessive amounts of blood in the stool  Significant tenderness or worsening of abdominal pains  Swelling of the abdomen that is new, acute  Fever of 100F or higher  Resume previous diet Await pathology results OK to proceed with ostomy takedown/reversal as scheduled Recommend against further colon cancer screening/polyp surveillance given age and lack of high risk polyps    For urgent or emergent issues, a gastroenterologist can be reached at any hour by calling [phone]. Do not use MyChart messaging for urgent concerns.    DIET:  We do recommend a small meal at first, but then you may proceed to your regular diet.   Drink plenty of fluids but you should avoid alcoholic beverages for 24 hours.  ACTIVITY:  You should plan to take it easy for the rest of today and you should NOT DRIVE or use heavy machinery until tomorrow (because of the sedation medicines used during the test).    FOLLOW UP: Our staff will call the number listed on your records the next business day following your procedure.  We will call around 7:15- 8:00 am to check on you and address any questions or concerns that you may have regarding the information given to you following your procedure. If we do not reach you, we will leave a message.     If any biopsies were taken you will be contacted by phone or by letter within the next 1-3 weeks.  Please call us  at [phone] if you have not heard about the biopsies in 3 weeks.    SIGNATURES/CONFIDENTIALITY: You and/or your care partner have signed paperwork which will be entered into your electronic medical record.  These signatures attest to the fact that that the information above on your After Visit Summary has been reviewed and is understood.  Full responsibility of the confidentiality of this discharge information lies with you and/or your care-partner.

## 2024-02-23 NOTE — Progress Notes (Signed)
 Called to room to assist during endoscopic procedure.  Patient ID and intended procedure confirmed with present staff. Received instructions for my participation in the procedure from the performing physician.

## 2024-02-23 NOTE — Op Note (Signed)
 [facility] Patient Name: Angela [name] Procedure Date: 02/23/2024 12:02 PM MRN: [id number] Endoscopist: Glendia E. Stacia , MD, [id number] Age: 74 Referring MD:  Date of Birth: Aug 19, 1950 Gender: Female Account #: [id number] Procedure:                Colonoscopy via Stoma with Endoscopy of Hartmann                            Pouch Indications:              Preoperative assessment for reversal of Hartmann                            pouch Medicines:                Monitored Anesthesia Care Procedure:                Pre-Anesthesia Assessment:                           - Prior to the procedure, a History and Physical                            was performed, and patient medications and                            allergies were reviewed. The patient's tolerance of                            previous anesthesia was also reviewed. The risks                            and benefits of the procedure and the sedation                            options and risks were discussed with the patient.                            All questions were answered, and informed consent                            was obtained. Prior Anticoagulants: The patient has                            taken no anticoagulant or antiplatelet agents. ASA                            Grade Assessment: II - A patient with mild systemic                            disease. After reviewing the risks and benefits,                            the patient was deemed in satisfactory condition to  undergo the procedure.                           After obtaining informed consent, the endoscope was                            passed under direct vision. Throughout the                            procedure, the patient's blood pressure, pulse, and                            oxygen saturations were monitored continuously. The                            Olympus Scope [id number] was introduced through the                             descending colostomy and advanced to the cecum,                            identified by appendiceal orifice and ileocecal                            valve. The Olympus Scope [id number] was introduced                            through the anus and advanced to the [facility]                            pouch. The procedure was performed without                            difficulty. The patient tolerated the procedure                            well. The quality of the bowel preparation was                            adequate. Scope In: 12:12:05 PM Scope Out: 12:37:13 PM Scope Withdrawal Time: 0 hours 9 minutes 53 seconds  Total Procedure Duration: 0 hours 25 minutes 8 seconds  Findings:                 The perianal examination was normal.                           The digital rectal exam revealed a rubbery-textured                            anal lesion consistent with the hypertrophied anal                            papilla noted endoscopically.  There was evidence of a widely patent end colostomy                            in the descending colon. The ostomy was mildly                            protuberant and prolapsed and was characterized by                            healthy appearing mucosa.                           A 3 mm polyp was found in the transverse colon. The                            polyp was sessile. The polyp was removed with a                            cold snare. Resection and retrieval were complete.                            Estimated blood loss was minimal.                           A few medium-mouthed and small-mouthed diverticula                            were found in the descending colon and transverse                            colon.                           The exam was otherwise normal throughout the                            examined colon.                           There was mucus in the rectum/Hartmann  pouch. The                            mucosa was somewhat friable but otherwise appeared                            normal. Sutures and staples were seen at the                            termination of the Hartmann's pouch (12 cm from                            anal verge).                           Anal papilla(e)  were hypertrophied.                           The retroflexed view of the distal rectum and anal                            verge was normal and showed no anal or rectal                            abnormalities. Complications:            No immediate complications. Estimated Blood Loss:     Estimated blood loss was minimal. Impression:               - Palpable anal lesion consistent with                            hypertrophied anal papilla noted endoscopically                           - Widely patent end colostomy with healthy                            appearing mucosa in the descending colon.                           - One 3 mm polyp in the transverse colon, removed                            with a cold snare. Resected and retrieved.                           - Diverticulosis in the descending colon and in the                            transverse colon.                           - The rectum/Hartmann pouch is normal.                           - Anal papilla(e) were hypertrophied.                           - The distal rectum and anal verge are normal on                            retroflexion view. Recommendation:           - Patient has a contact number available for                            emergencies. The signs and symptoms of potential                            delayed complications were discussed with the  patient. Return to normal activities tomorrow.                            Written discharge instructions were provided to the                            patient.                           - Resume previous diet.                            - Await pathology results.                           - Ok to proceed with ostomy takedown/reversal as                            scheduled                           - Recommend against further colon cancer                            screening/polyp surveillance given age and lack of                            high risk polyps. Rollan Roger E. Stacia, MD 02/23/2024 12:51:41 PM This report has been signed electronically.

## 2024-02-23 NOTE — Progress Notes (Signed)
 [facility] History and Physical   Primary Care Physician:  Catherine Charlies LABOR, DO   Reason for Procedure:   S/p Hartmann's, colonoscopy prior to ostomy takedown  Plan:    Colonoscopy via ostomy and Hartmann's pouchoscopy   HPI: Angela [name] is a 74 y.o. female with perforated sigmoid colon secondary to stercoral colitis vs diverticulitis in June 2025, s/p Hartmann's, needs colonoscopy prior to ostomy takedown.   She had a colonoscopy in June 2021 by Dr. Burnette with a 5 mm tubular adenoma in the ascending colon.  Procedure was technically difficult due to restricted mobility of the colon and redundant colon with significant looping.  The patient was provided an opportunity to ask questions and all were answered. The patient agreed with the plan   Past Medical History:  Diagnosis Date   Arthritis    Blood in stool    greater than 4 years since last incidence of blood in stool    Colon polyps    Glaucoma    Hydroureteronephrosis 07/2023   Hyperlipidemia    Hypertension    Inclusion cyst 2021   face   Perforation of sigmoid colon (HCC) 07/2023   Colitis   PNA (pneumonia) 2006   Rib fracture 01/2024   Sepsis (HCC) 07/2023   Strain of left wrist 04/16/2019    Past Surgical History:  Procedure Laterality Date   ABSCESS DRAINAGE  2021   face- inclusion cyst   BREAST BIOPSY Left 07/19/2018   US  guided breast bx with clip placement- benign   COLON RESECTION SIGMOID  07/30/2023   Procedure: COLECTOMY, SIGMOID, OPEN;  Surgeon: Polly Cordella LABOR, MD;  Location: [facility] OR;  Service: General;;   CYSTOSCOPY W/ URETERAL STENT PLACEMENT Left 08/18/2023   Procedure: CYSTOSCOPY, WITH RETROGRADE PYELOGRAM AND URETERAL STENT INSERTION;  Surgeon: Lovie Arlyss CROME, MD;  Location: [facility] OR;  Service: Urology;  Laterality: Left;   CYSTOSCOPY/URETEROSCOPY/HOLMIUM LASER/STENT PLACEMENT Left 09/26/2023   Procedure: CYSTOSCOPY/URETEROSCOPY/HOLMIUM LASER/STENT PLACEMENT;  Surgeon: Lovie Arlyss CROME, MD;  Location: [facility]S;  Service: Urology;  Laterality: Left;   EYE SURGERY  1956   HEMORRHOID SURGERY  2001   IR CATHETER TUBE CHANGE  08/30/2023   IR CV LINE INJECTION  08/24/2023   IR RADIOLOGIST EVAL & MGMT  09/15/2023   IR RADIOLOGIST EVAL & MGMT  09/20/2023   IR RADIOLOGIST EVAL & MGMT  10/18/2023   IR RADIOLOGIST EVAL & MGMT  10/04/2023   LAPAROTOMY N/A 07/30/2023   Procedure: LAPAROTOMY, EXPLORATORY;  Surgeon: Polly Cordella LABOR, MD;  Location: [facility] OR;  Service: General;  Laterality: N/A;   RADIOACTIVE SEED GUIDED EXCISIONAL BREAST BIOPSY Left 03/15/2019   Procedure: RADIOACTIVE SEED GUIDED EXCISIONAL LEFT  BREAST BIOPSY;  Surgeon: Gladis Cough, MD;  Location:  SURGERY CENTER;  Service: General;  Laterality: Left;   TONSILLECTOMY  1963   WISDOM TOOTH EXTRACTION      Prior to Admission medications  Medication Sig Start Date End Date Taking? Authorizing Provider  latanoprost  (XALATAN ) 0.005 % ophthalmic solution Place 1 drop into both eyes at bedtime. 08/11/17  Yes [provider]  lisinopril  (ZESTRIL ) 10 MG tablet Take 1 tablet (10 mg total) by mouth daily. 09/06/23  Yes Kuneff, Renee A, DO  timolol  (TIMOPTIC ) 0.5 % ophthalmic solution Place 1 drop into both eyes daily. 11/14/19  Yes [provider]  acetaminophen  (TYLENOL ) 500 MG tablet Take 2 tablets (1,000 mg total) by mouth every 6 (six) hours as needed. Patient not taking: No sig  reported 09/04/23   Perri DELENA Meliton Mickey., MD  albuterol  (VENTOLIN  HFA) [phone] Base) MCG/ACT inhaler Inhale 2 puffs into the lungs every 6 (six) hours as needed for wheezing or shortness of breath. Patient not taking: No sig reported 03/03/22   Kuneff, Renee A, DO  carvedilol  (COREG ) 6.25 MG tablet Take 1 tablet (6.25 mg total) by mouth 2 (two) times daily with a meal. Patient not taking: No sig reported 09/06/23   Kuneff, Renee A, DO  celecoxib (CELEBREX) 100 MG capsule Take 100 mg by mouth 2 (two) times daily. Patient not taking:  No sig reported    [provider]  ferrous sulfate  325 (65 FE) MG tablet Take 1 tablet (325 mg total) by mouth every other day. Follow your iron and anemia with your PCP outpatient 10/31/23 01/12/24  Catherine Fuller A, DO  finasteride  (PROSCAR ) 5 MG tablet Take 1 tablet (5 mg total) by mouth daily. Patient not taking: Reported on 02/23/2024 01/11/24   Kuneff, Renee A, DO  Hyoscyamine  Sulfate SL (LEVSIN /SL) 0.125 MG SUBL Place 1 tablet (0.125 mg total) under the tongue every 6 (six) hours as needed (bladder spasms, stent discomfort). Patient not taking: Reported on 01/12/2024 09/26/23   Lovie Arlyss CROME, MD  ipratropium (ATROVENT ) 0.03 % nasal spray Place 2 sprays into both nostrils every 12 (twelve) hours. 09/30/23   Fleming, Zelda W, NP  Multiple Vitamin (MULTIVITAMIN) tablet Take 1 tablet by mouth daily.    [provider]  pantoprazole  (PROTONIX ) 40 MG tablet Take 1 tablet (40 mg total) by mouth daily. Patient not taking: No sig reported 09/06/23   Kuneff, Renee A, DO  polyethylene glycol powder (GLYCOLAX /MIRALAX ) 17 GM/SCOOP powder Take 17 g by mouth daily. 09/04/23   Perri DELENA Meliton Mickey., MD  Probiotic Product (DAILY PROBIOTIC PO) Take 1 capsule by mouth daily at 12 noon. 09/19/23   [provider]    Current Outpatient Medications  Medication Sig Dispense Refill   latanoprost  (XALATAN ) 0.005 % ophthalmic solution Place 1 drop into both eyes at bedtime.  4   lisinopril  (ZESTRIL ) 10 MG tablet Take 1 tablet (10 mg total) by mouth daily. 90 tablet 1   timolol  (TIMOPTIC ) 0.5 % ophthalmic solution Place 1 drop into both eyes daily.     acetaminophen  (TYLENOL ) 500 MG tablet Take 2 tablets (1,000 mg total) by mouth every 6 (six) hours as needed. (Patient not taking: No sig reported)     albuterol  (VENTOLIN  HFA) 108 (90 Base) MCG/ACT inhaler Inhale 2 puffs into the lungs every 6 (six) hours as needed for wheezing or shortness of breath. (Patient not taking: No sig reported) 8 g 0    carvedilol  (COREG ) 6.25 MG tablet Take 1 tablet (6.25 mg total) by mouth 2 (two) times daily with a meal. (Patient not taking: No sig reported) 60 tablet 5   celecoxib (CELEBREX) 100 MG capsule Take 100 mg by mouth 2 (two) times daily. (Patient not taking: No sig reported)     ferrous sulfate  325 (65 FE) MG tablet Take 1 tablet (325 mg total) by mouth every other day. Follow your iron and anemia with your PCP outpatient 15 tablet 0   finasteride  (PROSCAR ) 5 MG tablet Take 1 tablet (5 mg total) by mouth daily. (Patient not taking: Reported on 02/23/2024) 90 tablet 1   Hyoscyamine  Sulfate SL (LEVSIN /SL) 0.125 MG SUBL Place 1 tablet (0.125 mg total) under the tongue every 6 (six) hours as needed (bladder spasms, stent discomfort). (Patient not  taking: Reported on 01/12/2024) 20 tablet 1   ipratropium (ATROVENT ) 0.03 % nasal spray Place 2 sprays into both nostrils every 12 (twelve) hours. 30 mL 0   Multiple Vitamin (MULTIVITAMIN) tablet Take 1 tablet by mouth daily.     pantoprazole  (PROTONIX ) 40 MG tablet Take 1 tablet (40 mg total) by mouth daily. (Patient not taking: No sig reported) 90 tablet 1   polyethylene glycol powder (GLYCOLAX /MIRALAX ) 17 GM/SCOOP powder Take 17 g by mouth daily. 238 g 0   Probiotic Product (DAILY PROBIOTIC PO) Take 1 capsule by mouth daily at 12 noon.     Current Facility-Administered Medications  Medication Dose Route Frequency Provider Last Rate Last Admin   0.9 %  sodium chloride  infusion  500 mL Intravenous Continuous Stacia Glendia BRAVO, MD        Allergies as of 02/23/2024 - Review Complete 02/23/2024  Allergen Reaction Noted   Codeine Other (See Comments) 03/07/2019    Family History  Problem Relation Age of Onset   Heart attack Father    Heart attack Sister    Hyperlipidemia Sister    Hypertension Sister    Breast cancer Neg Hx     Social History   Socioeconomic History   Marital status: Married    Spouse name: Not on file   Number of children: Not  on file   Years of education: Not on file   Highest education level: Bachelor's degree (e.g., BA, AB, BS)  Occupational History   Not on file  Tobacco Use   Smoking status: Former    Current packs/day: 0.00    Types: Cigars, Cigarettes    Passive exposure: Past   Smokeless tobacco: Never   Tobacco comments:    *occasional smoker   Vaping Use   Vaping status: Former  Substance and Sexual Activity   Alcohol use: Not Currently    Alcohol/week: 4.0 standard drinks of alcohol    Types: 4 Glasses of wine per week    Comment: *rarely    Drug use: No   Sexual activity: Yes    Partners: Male    Comment: Married  Other Topics Concern   Not on file  Social History Narrative   Married. Retired. 2 children.   Bachelors degree.   Exercises routinely.   Drinks caffeine.   Smoke alarm in the home. Wears her seatbelt. Owns firearms.   Feels safe in her relationships.   Social Drivers of Health   Tobacco Use: Medium Risk (02/23/2024)   Patient History    Smoking Tobacco Use: Former    Smokeless Tobacco Use: Never    Passive Exposure: Past  Physicist, Medical Strain: Patient Declined (12/01/2023)   Overall Financial Resource Strain (CARDIA)    Difficulty of Paying Living Expenses: Patient declined  Food Insecurity: Unknown (12/01/2023)   Epic    Worried About Programme Researcher, Broadcasting/film/video in the Last Year: Patient declined    Barista in the Last Year: Not on file  Transportation Needs: No Transportation Needs (12/01/2023)   Epic    Lack of Transportation (Medical): No    Lack of Transportation (Non-Medical): No  Physical Activity: Sufficiently Active (12/01/2023)   Exercise Vital Sign    Days of Exercise per Week: 7 days    Minutes of Exercise per Session: 40 min  Stress: No Stress Concern Present (12/01/2023)   [employer] of Occupational Health - Occupational Stress Questionnaire    Feeling of Stress: Not at all  Social Connections: Unknown (  12/01/2023)   Social  Connection and Isolation Panel    Frequency of Communication with Friends and Family: Patient declined    Frequency of Social Gatherings with Friends and Family: Patient declined    Attends Religious Services: Patient declined    Active Member of Clubs or Organizations: Patient declined    Attends Banker Meetings: Not on file    Marital Status: Patient declined  Intimate Partner Violence: Patient Declined (08/02/2023)   Epic    Fear of Current or Ex-Partner: Patient declined    Emotionally Abused: Patient declined    Physically Abused: Patient declined    Sexually Abused: Patient declined  Depression (PHQ2-9): Low Risk (06/23/2023)   Depression (PHQ2-9)    PHQ-2 Score: 0  Alcohol Screen: Low Risk (04/19/2023)   Alcohol Screen    Last Alcohol Screening Score (AUDIT): 2  Housing: Patient Declined (12/01/2023)   Epic    Unable to Pay for Housing in the Last Year: Patient declined    Number of Times Moved in the Last Year: Not on file    Homeless in the Last Year: Patient declined  Utilities: Not At Risk (09/05/2023)   Epic    Threatened with loss of utilities: No  Health Literacy: Adequate Health Literacy (04/19/2023)   B1300 Health Literacy    Frequency of need for help with medical instructions: Never    Review of Systems:  All other review of systems negative except as mentioned in the HPI.  Physical Exam: Vital signs BP 130/69   Pulse 69   Temp (!) 97.4 F (36.3 C) (Temporal)   Ht 5' 4 (1.626 m)   Wt 136 lb (61.7 kg)   SpO2 100%   BMI 23.34 kg/m   General:   Alert,  Well-developed, well-nourished, pleasant and cooperative in NAD Airway:  Mallampati 3 Lungs:  Clear throughout to auscultation.   Heart:  Regular rate and rhythm; no murmurs, clicks, rubs,  or gallops. Abdomen:  Soft, nontender and nondistended. Normal bowel sounds.  Ostomy in LLQ with light brown stool Neuro/Psych:  Normal mood and affect. A and O x 3   Agata Lucente E. Stacia, MD [facility]  [facility]astroenterology

## 2024-02-23 NOTE — Progress Notes (Signed)
 Sedate, gd SR, tolerated procedure well, VSS, report to RN

## 2024-02-26 ENCOUNTER — Telehealth: Payer: Self-pay | Admitting: *Deleted

## 2024-02-26 NOTE — Telephone Encounter (Signed)
" °  Follow up Call-     02/23/2024   10:59 AM  Call back number  Post procedure Call Back phone  # [phone]  Permission to leave phone message Yes     Patient questions:  Do you have a fever, pain , or abdominal swelling? No. Pain Score  0 *  Have you tolerated food without any problems? Yes.    Have you been able to return to your normal activities? Yes.    Do you have any questions about your discharge instructions: Diet   No. Medications  No. Follow up visit  No.  Do you have questions or concerns about your Care? No.  Actions: * If pain score is 4 or above: No action needed, pain <4.   "

## 2024-02-27 LAB — SURGICAL PATHOLOGY

## 2024-02-28 ENCOUNTER — Ambulatory Visit: Payer: Self-pay | Admitting: Gastroenterology

## 2024-02-28 NOTE — Progress Notes (Signed)
 Angela [name],  The polyp which I removed during your recent procedure was proven to be completely benign but is considered a pre-cancerous polyp that MAY have grown into cancer if it had not been removed.  Studies shows that at least 20% of women over age 72 and 30% of men over age 61 have pre-cancerous polyps.  Based on current nationally recognized surveillance guidelines, it would be recommended that you have a repeat colonoscopy in 7 years.  However, because colon cancer screening after age 45 is done on a case-by-case basis, taking into account the patient's risk factors for colon cancer, as well as comorbidities and life expectancy, I would recommend against any further colon cancer screening.   If you develop any new rectal bleeding, abdominal pain or significant bowel habit changes, please contact me before then.

## 2024-04-01 ENCOUNTER — Inpatient Hospital Stay (HOSPITAL_COMMUNITY): Admit: 2024-04-01 | Admitting: General Surgery
